# Patient Record
Sex: Female | Born: 1950 | Race: White | Hispanic: No | Smoking: Former smoker
Health system: Southern US, Community
[De-identification: ages and names within clinical notes are randomized; demographics above are authoritative.]

## PROBLEM LIST (undated history)

## (undated) DIAGNOSIS — N281 Cyst of kidney, acquired: Secondary | ICD-10-CM

## (undated) DIAGNOSIS — I2510 Atherosclerotic heart disease of native coronary artery without angina pectoris: Secondary | ICD-10-CM

## (undated) DIAGNOSIS — F32A Depression, unspecified: Secondary | ICD-10-CM

## (undated) DIAGNOSIS — I739 Peripheral vascular disease, unspecified: Secondary | ICD-10-CM

## (undated) DIAGNOSIS — I10 Essential (primary) hypertension: Secondary | ICD-10-CM

## (undated) DIAGNOSIS — M47819 Spondylosis without myelopathy or radiculopathy, site unspecified: Secondary | ICD-10-CM

## (undated) DIAGNOSIS — H3530 Unspecified macular degeneration: Secondary | ICD-10-CM

## (undated) DIAGNOSIS — Z720 Tobacco use: Secondary | ICD-10-CM

## (undated) DIAGNOSIS — I701 Atherosclerosis of renal artery: Secondary | ICD-10-CM

## (undated) DIAGNOSIS — I779 Disorder of arteries and arterioles, unspecified: Secondary | ICD-10-CM

## (undated) DIAGNOSIS — K449 Diaphragmatic hernia without obstruction or gangrene: Secondary | ICD-10-CM

## (undated) DIAGNOSIS — M797 Fibromyalgia: Secondary | ICD-10-CM

## (undated) DIAGNOSIS — E119 Type 2 diabetes mellitus without complications: Secondary | ICD-10-CM

## (undated) DIAGNOSIS — E785 Hyperlipidemia, unspecified: Secondary | ICD-10-CM

## (undated) DIAGNOSIS — K219 Gastro-esophageal reflux disease without esophagitis: Secondary | ICD-10-CM

## (undated) DIAGNOSIS — G4730 Sleep apnea, unspecified: Secondary | ICD-10-CM

## (undated) DIAGNOSIS — R9389 Abnormal findings on diagnostic imaging of other specified body structures: Secondary | ICD-10-CM

## (undated) DIAGNOSIS — F329 Major depressive disorder, single episode, unspecified: Secondary | ICD-10-CM

## (undated) DIAGNOSIS — I214 Non-ST elevation (NSTEMI) myocardial infarction: Secondary | ICD-10-CM

## (undated) DIAGNOSIS — G2581 Restless legs syndrome: Secondary | ICD-10-CM

## (undated) DIAGNOSIS — F419 Anxiety disorder, unspecified: Secondary | ICD-10-CM

## (undated) DIAGNOSIS — M1990 Unspecified osteoarthritis, unspecified site: Secondary | ICD-10-CM

## (undated) HISTORY — DX: Diaphragmatic hernia without obstruction or gangrene: K44.9

## (undated) HISTORY — DX: Restless legs syndrome: G25.81

## (undated) HISTORY — DX: Disorder of arteries and arterioles, unspecified: I77.9

## (undated) HISTORY — PX: EYE SURGERY: SHX253

## (undated) HISTORY — PX: BREAST LUMPECTOMY: SHX2

## (undated) HISTORY — DX: Anxiety disorder, unspecified: F41.9

## (undated) HISTORY — DX: Gastro-esophageal reflux disease without esophagitis: K21.9

## (undated) HISTORY — DX: Unspecified osteoarthritis, unspecified site: M19.90

## (undated) HISTORY — DX: Fibromyalgia: M79.7

## (undated) HISTORY — PX: COLONOSCOPY: SHX174

## (undated) HISTORY — DX: Atherosclerosis of renal artery: I70.1

## (undated) HISTORY — DX: Non-ST elevation (NSTEMI) myocardial infarction: I21.4

## (undated) HISTORY — DX: Peripheral vascular disease, unspecified: I73.9

## (undated) HISTORY — PX: LUMBAR DISC SURGERY: SHX700

## (undated) HISTORY — DX: Hyperlipidemia, unspecified: E78.5

## (undated) HISTORY — DX: Essential (primary) hypertension: I10

## (undated) HISTORY — DX: Type 2 diabetes mellitus without complications: E11.9

## (undated) HISTORY — PX: OTHER SURGICAL HISTORY: SHX169

## (undated) HISTORY — DX: Spondylosis without myelopathy or radiculopathy, site unspecified: M47.819

## (undated) HISTORY — PX: TUBAL LIGATION: SHX77

## (undated) HISTORY — DX: Major depressive disorder, single episode, unspecified: F32.9

## (undated) HISTORY — DX: Atherosclerotic heart disease of native coronary artery without angina pectoris: I25.10

## (undated) HISTORY — DX: Depression, unspecified: F32.A

## (undated) HISTORY — PX: TOTAL KNEE ARTHROPLASTY: SHX125

---
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
## 1975-07-15 HISTORY — PX: ABDOMINAL HYSTERECTOMY: SHX81

## 1989-07-14 HISTORY — PX: CHOLECYSTECTOMY: SHX55

## 1992-07-14 HISTORY — PX: SPINE SURGERY: SHX786

## 1994-07-14 HISTORY — PX: OTHER SURGICAL HISTORY: SHX169

## 2002-08-18 ENCOUNTER — Encounter (HOSPITAL_COMMUNITY): Admission: RE | Admit: 2002-08-18 | Discharge: 2002-09-17 | Payer: Self-pay | Admitting: Rheumatology

## 2002-08-18 ENCOUNTER — Encounter: Payer: Self-pay | Admitting: Rheumatology

## 2002-11-24 ENCOUNTER — Encounter (HOSPITAL_COMMUNITY): Admission: RE | Admit: 2002-11-24 | Discharge: 2002-12-24 | Payer: Self-pay | Admitting: Rheumatology

## 2002-11-24 ENCOUNTER — Encounter: Payer: Self-pay | Admitting: Rheumatology

## 2004-01-17 ENCOUNTER — Inpatient Hospital Stay (HOSPITAL_COMMUNITY): Admission: RE | Admit: 2004-01-17 | Discharge: 2004-01-20 | Payer: Self-pay | Admitting: Neurosurgery

## 2004-07-14 HISTORY — PX: JOINT REPLACEMENT: SHX530

## 2008-09-27 ENCOUNTER — Encounter: Payer: Self-pay | Admitting: Cardiology

## 2008-10-23 ENCOUNTER — Ambulatory Visit: Payer: Self-pay | Admitting: Cardiology

## 2008-10-24 ENCOUNTER — Ambulatory Visit: Payer: Self-pay | Admitting: Cardiovascular Disease

## 2008-10-24 ENCOUNTER — Inpatient Hospital Stay (HOSPITAL_COMMUNITY): Admission: AD | Admit: 2008-10-24 | Discharge: 2008-10-26 | Payer: Self-pay | Admitting: Internal Medicine

## 2008-10-24 ENCOUNTER — Encounter: Payer: Self-pay | Admitting: Cardiology

## 2008-11-14 ENCOUNTER — Ambulatory Visit: Payer: Self-pay | Admitting: Cardiology

## 2008-12-25 ENCOUNTER — Ambulatory Visit: Payer: Self-pay | Admitting: Surgery

## 2009-02-12 ENCOUNTER — Ambulatory Visit: Payer: Self-pay | Admitting: Surgery

## 2009-02-16 ENCOUNTER — Ambulatory Visit: Payer: Self-pay | Admitting: Cardiology

## 2009-02-26 ENCOUNTER — Encounter: Payer: Self-pay | Admitting: Cardiology

## 2009-02-28 ENCOUNTER — Encounter: Payer: Self-pay | Admitting: Cardiology

## 2009-02-28 ENCOUNTER — Ambulatory Visit: Payer: Self-pay | Admitting: Surgery

## 2009-02-28 ENCOUNTER — Inpatient Hospital Stay (HOSPITAL_COMMUNITY): Admission: RE | Admit: 2009-02-28 | Discharge: 2009-03-01 | Payer: Self-pay | Admitting: Surgery

## 2009-02-28 ENCOUNTER — Encounter: Payer: Self-pay | Admitting: Surgery

## 2009-02-28 HISTORY — PX: CAROTID ENDARTERECTOMY: SUR193

## 2009-03-01 ENCOUNTER — Encounter: Payer: Self-pay | Admitting: Cardiology

## 2009-03-06 ENCOUNTER — Encounter: Payer: Self-pay | Admitting: Cardiology

## 2009-03-12 DIAGNOSIS — I2510 Atherosclerotic heart disease of native coronary artery without angina pectoris: Secondary | ICD-10-CM | POA: Insufficient documentation

## 2009-03-12 DIAGNOSIS — I10 Essential (primary) hypertension: Secondary | ICD-10-CM | POA: Insufficient documentation

## 2009-03-26 ENCOUNTER — Encounter: Payer: Self-pay | Admitting: Cardiology

## 2009-03-26 ENCOUNTER — Ambulatory Visit: Payer: Self-pay | Admitting: Surgery

## 2009-04-19 ENCOUNTER — Ambulatory Visit: Payer: Self-pay | Admitting: Cardiology

## 2009-04-19 DIAGNOSIS — E785 Hyperlipidemia, unspecified: Secondary | ICD-10-CM

## 2009-04-30 ENCOUNTER — Encounter (INDEPENDENT_AMBULATORY_CARE_PROVIDER_SITE_OTHER): Payer: Self-pay | Admitting: *Deleted

## 2009-05-11 ENCOUNTER — Encounter: Payer: Self-pay | Admitting: Physician Assistant

## 2009-05-14 ENCOUNTER — Encounter (INDEPENDENT_AMBULATORY_CARE_PROVIDER_SITE_OTHER): Payer: Self-pay | Admitting: *Deleted

## 2009-09-17 ENCOUNTER — Encounter: Payer: Self-pay | Admitting: Cardiology

## 2009-10-01 ENCOUNTER — Encounter: Payer: Self-pay | Admitting: Cardiology

## 2009-10-01 ENCOUNTER — Ambulatory Visit: Payer: Self-pay | Admitting: Surgery

## 2009-10-30 ENCOUNTER — Encounter: Payer: Self-pay | Admitting: Cardiology

## 2009-11-13 ENCOUNTER — Ambulatory Visit: Payer: Self-pay | Admitting: Cardiology

## 2009-11-13 DIAGNOSIS — I6529 Occlusion and stenosis of unspecified carotid artery: Secondary | ICD-10-CM | POA: Insufficient documentation

## 2010-05-20 ENCOUNTER — Ambulatory Visit: Payer: Self-pay | Admitting: Surgery

## 2010-07-23 ENCOUNTER — Ambulatory Visit
Admission: RE | Admit: 2010-07-23 | Discharge: 2010-07-23 | Payer: Self-pay | Source: Home / Self Care | Attending: Cardiology | Admitting: Cardiology

## 2010-07-23 ENCOUNTER — Encounter: Payer: Self-pay | Admitting: Cardiology

## 2010-08-15 NOTE — Assessment & Plan Note (Signed)
Summary: 6 MO FU -SRS   Visit Type:  Follow-up Primary Provider:  Dr. Doreen Beam   History of Present Illness: 60 year old woman presents for a followup visit. She denies any significant problems with angina or unusual shortness of breath. She has lost a significant amount of weight, reportedly through exercise and limited carbohydrate intake. She reports compliance with her medications and states she had recent labs done by Dr. Sherril Croon.  She underwent a left carotid endarterectomy in August of last year. Recent followup carotid duplex results are noted below. She is followed by Dr. Myra Gianotti.  Followup labs from 29 October reveal total cholesterol 151, triglycerides 138, HDL 51, LDL to 72, AST 21, ALT 16.  She states that she was found to have some apparent gastritis based on endoscopy since I last saw her. This has improved, and she continues on dual antiplatelet therapy. She does have 4 drug-eluting stents between the right coronary artery and circumflex, and after discussing the matter, we elected to continue her dual antiplatelet therapy at least for the time being. She is greater than one year out at this point from her intervention.  Current Medications (verified): 1)  Maxzide-25 37.5-25 Mg Tabs (Triamterene-Hctz) .... Take 1/2 Tablet By Mouth Once A Day 2)  Dexilant 60 Mg Cpdr (Dexlansoprazole) .... Take 1 Tablet By Mouth Once A Day 3)  Plavix 75 Mg Tabs (Clopidogrel Bisulfate) .... Take One Tablet By Mouth Daily 4)  Simvastatin 80 Mg Tabs (Simvastatin) .... Take One Tablet By Mouth Daily At Bedtime 5)  Aspirin Ec 325 Mg Tbec (Aspirin) .... Take One Tablet By Mouth Daily 6)  Nabumetone 750 Mg Tabs (Nabumetone) .... Take 1 Tablet By Mouth Two Times A Day 7)  Folic Acid 1 Mg Tabs (Folic Acid) .... Take 1 Tablet By Mouth Once A Day 8)  Soma 350 Mg Tabs (Carisoprodol) .... Take 1 Tablet By Mouth Once A Day 9)  Lexapro 10 Mg Tabs (Escitalopram Oxalate) .... Take 1 Tablet By Mouth Once A  Day 10)  Potassium Chloride Cr 10 Meq Cr-Caps (Potassium Chloride) .... Take One Tablet By Mouth Daily 11)  Metformin Hcl 500 Mg Tabs (Metformin Hcl) .... Take 1 Tablet By Mouth Once A Day 12)  Doxepin Hcl 50 Mg Caps (Doxepin Hcl) .... Take 1 Tab By Mouth At Bedtime 13)  Percocet 7.5-500 Mg Tabs (Oxycodone-Acetaminophen) .... Take 1 Tablet By Mouth Three Times A Day 14)  Furosemide 20 Mg Tabs (Furosemide) .... Take One Tablet By Mouth Daily. As Needed 15)  Valium 10 Mg Tabs (Diazepam) .... As Needed 16)  Nitrostat 0.4 Mg Subl (Nitroglycerin) .... One Tablet Under Tongue As Needed For Chest Pain,repeat Only 1 Time in .use As Directed 17)  Dexilant 60 Mg Cpdr (Dexlansoprazole) .... Take 1 Tab Daily  Allergies (verified): No Known Drug Allergies  Past History:  Social History: Last updated: 11/13/2009 Disabled  Tobacco Use - Former Alcohol Use - no  Past Medical History: Fibromyalgia Carotid artery disease - 60-79% RICA, 40-59% LICA (Dr. Myra Gianotti) Myocardial Infarction - NSTEMI 4/10 CAD - DES RCA/circumflex 4/10, LVEF 65% Hypertension Diabetes Type 2 Arthritis G E R D Depression Anxiety  Past Surgical History: Tubal ligation.  Partial hysterectomy Back Surgery Cholecystectomy Knee Arthroplasty-Total Lumpectomy Neck fusion Carotid Endarterectomy - left, 8/10  Family History: Father: died age 71 with MI Mother: died age 28 with CAD  Social History: Disabled  Tobacco Use - Former Alcohol Use - no  Review of Systems  The patient denies anorexia,  fever, weight gain, chest pain, syncope, dyspnea on exertion, peripheral edema, prolonged cough, hemoptysis, melena, hematochezia, and severe indigestion/heartburn.         Otherwise reviewed and negative.  Vital Signs:  Patient profile:   60 year old female Weight:      127 pounds Pulse rate:   79 / minute BP sitting:   110 / 60  (right arm)  Vitals Entered By: Dreama Saa, CNA (Nov 13, 2009 10:14  AM)  Physical Exam  Additional Exam:  Normally nourished appearing woman in no acute distress. HEENT: Conjunctiva and lids normal, oropharynx clear. Neck: Supple, no elevated JVP, carotid bruits noted. Well-healed CEA scar. Lungs: Clear to auscultation, diminished, unlabored. Cardiac: Regular rate and rhythm, no S3 or rub. Abdomen: Soft, nontender, bowel sounds present. Neck; no pitting edema.   EKG  Procedure date:  11/13/2009  Findings:      Normal sinus rhythm at 75 beats per minute with left atrial enlargement and nonspecific T-wave changes.  Impression & Recommendations:  Problem # 1:  CAD, NATIVE VESSEL (ICD-414.01)  Symptomatically stable on present medical regimen. Electrocardiogram is nonspecific. She has continued with a regular exercise regimen and has lost weight with diet as well. I plan to see her back in 6 months.  Her updated medication list for this problem includes:    Plavix 75 Mg Tabs (Clopidogrel bisulfate) .Marland Kitchen... Take one tablet by mouth daily    Aspirin Ec 325 Mg Tbec (Aspirin) .Marland Kitchen... Take one tablet by mouth daily    Nitrostat 0.4 Mg Subl (Nitroglycerin) ..... One tablet under tongue as needed for chest pain,repeat only 1 time in .use as directed  Problem # 2:  DYSLIPIDEMIA (ICD-272.4)  Recently followed by Dr. Sherril Croon. Will request labs.  Her updated medication list for this problem includes:    Simvastatin 80 Mg Tabs (Simvastatin) .Marland Kitchen... Take one tablet by mouth daily at bedtime  Problem # 3:  HYPERTENSION, UNSPECIFIED (ICD-401.9)  Blood pressure well controlled.  Her updated medication list for this problem includes:    Maxzide-25 37.5-25 Mg Tabs (Triamterene-hctz) .Marland Kitchen... Take 1/2 tablet by mouth once a day    Aspirin Ec 325 Mg Tbec (Aspirin) .Marland Kitchen... Take one tablet by mouth daily    Furosemide 20 Mg Tabs (Furosemide) .Marland Kitchen... Take one tablet by mouth daily. 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 Myra Gianotti.  Her updated medication list for this problem includes:    Plavix 75 Mg Tabs (Clopidogrel bisulfate) .Marland Kitchen... Take one tablet by mouth daily    Aspirin Ec 325 Mg Tbec (Aspirin) .Marland Kitchen... Take one tablet by mouth daily  Patient Instructions: 1)  Your physician wants you to follow-up in: 6 months. You will receive a reminder letter in the mail one-two months in advance. If you don't receive a letter, please call our office to schedule the follow-up appointment. 2)  Your physician recommends that you continue on your current medications as directed. Please refer to the Current Medication list given to you today.

## 2010-08-15 NOTE — Assessment & Plan Note (Signed)
Summary: 6 month fu -no hospital visits/vs   Visit Type:  Follow-up Primary Provider:  Dr. Doreen Beam   History of Present Illness: 60 year old woman presents for followup. She was seen in May of 2011. She denies any problems with progressive angina or shortness of breath with activity.  She reports continued follow up with Dr. Myra Gianotti for her carotid disease, reportedly stable. She states she's been able to lose some weight, and is no longer on diabetic medication. She states that she checks her glucose at home and it typically ranges from 100-120. Also reports interval diagnosis of macular degeneration, cataracts, and states that she underwent polypectomy with colonoscopy in the last 6 months.  She continues to have lipid followup with Dr. Sherril Croon. Has tolerated high-dose simvastatin for quite some time.  Preventive Screening-Counseling & Management  Alcohol-Tobacco     Smoking Status: quit     Year Quit: 10/2008  Current Medications (verified): 1)  Maxzide-25 37.5-25 Mg Tabs (Triamterene-Hctz) .... Take 1/2 Tablet By Mouth Once A Day 2)  Dexilant 60 Mg Cpdr (Dexlansoprazole) .... Take 1 Tablet By Mouth Once A Day 3)  Plavix 75 Mg Tabs (Clopidogrel Bisulfate) .... Take One Tablet By Mouth Daily 4)  Simvastatin 80 Mg Tabs (Simvastatin) .... Take One Tablet By Mouth Daily At Bedtime 5)  Aspirin Ec 325 Mg Tbec (Aspirin) .... Take One Tablet By Mouth Daily 6)  Folic Acid 1 Mg Tabs (Folic Acid) .... Take 1 Tablet By Mouth Once A Day 7)  Soma 350 Mg Tabs (Carisoprodol) .... Take 1 Tablet By Mouth Once A Day 8)  Lexapro 10 Mg Tabs (Escitalopram Oxalate) .... Take 1 Tablet By Mouth Once A Day 9)  Potassium Chloride Cr 10 Meq Cr-Caps (Potassium Chloride) .... Take One Tablet By Mouth Daily 10)  Percocet 10-325 Mg Tabs (Oxycodone-Acetaminophen) .... Take 1 Tablet By Mouth Four Times A Day As Needed 11)  Valium 10 Mg Tabs (Diazepam) .... As Needed 12)  Nitrostat 0.4 Mg Subl (Nitroglycerin) ....  One Tablet Under Tongue As Needed For Chest Pain,repeat Only 1 Time in .use As Directed 13)  Fentanyl 25 Mcg/hr Pt72 (Fentanyl) .... Apply 1 Patch Every 72 Hours 14)  Vyvanse 30 Mg Caps (Lisdexamfetamine Dimesylate) .... Take 1 Tablet By Mouth Once A Day  Allergies (verified): 1)  ! Codeine 2)  ! * Lyrica 3)  ! Celebrex  Comments:  Nurse/Medical Assistant: The patient's medication bottles and allergies were reviewed with the patient and were updated in the Medication and Allergy Lists.  Past History:  Past Medical History: Last updated: 11/13/2009 Fibromyalgia Carotid artery disease - 60-79% RICA, 40-59% LICA (Dr. Myra Gianotti) Myocardial Infarction - NSTEMI 4/10 CAD - DES RCA/circumflex 4/10, LVEF 65% Hypertension Diabetes Type 2 Arthritis G E R D Depression Anxiety  Social History: Last updated: 11/13/2009 Disabled  Tobacco Use - Former Alcohol Use - no  Review of Systems  The patient denies anorexia, fever, weight gain, chest pain, syncope, dyspnea on exertion, peripheral edema, abdominal pain, melena, and hematochezia.         Otherwise reviewed and negative except as outlined.  Vital Signs:  Patient profile:   61 year old female Height:      59 inches Weight:      116 pounds BMI:     23.51 Pulse rate:   80 / minute BP sitting:   148 / 73  (left arm) Cuff size:   regular  Vitals Entered By: Carlye Grippe (July 23, 2010 3:25 PM)  Physical Exam  Additional Exam:  Normally nourished appearing woman in no acute distress. HEENT: Conjunctiva and lids normal, oropharynx clear. Neck: Supple, no elevated JVP, bilateral carotid bruits noted. Well-healed CEA scar. Lungs: Clear to auscultation, diminished, unlabored. Cardiac: Regular rate and rhythm, no S3 or rub. Abdomen: Soft, nontender, bowel sounds present. Extremities: no pitting edema. Skin: Warm and dry. Musculoskeletal: No kyphosis. Neuropsychiatric: Alert and oriented x3,  appropriate.   EKG  Procedure date:  07/23/2010  Findings:      Sinus rhythm 88 beats per minute with left atrial enlargement, increased voltage.  Impression & Recommendations:  Problem # 1:  CAD, NATIVE VESSEL (ICD-414.01)  Symptomatically stable on medical therapy. Refilled nitroglycerin so she has a fresh bottle. Has not required any regular use however. Recommended continued exercise, weight control. Follow up in 6 months.  Her updated medication list for this problem includes:    Plavix 75 Mg Tabs (Clopidogrel bisulfate) .Marland Kitchen... Take one tablet by mouth daily    Aspirin Ec 325 Mg Tbec (Aspirin) .Marland Kitchen... Take one tablet by mouth daily    Nitrostat 0.4 Mg Subl (Nitroglycerin) ..... One tablet under tongue as needed for chest pain,repeat only 1 time in .use as directed  Orders: EKG w/ Interpretation (93000)  Problem # 2:  DYSLIPIDEMIA (ICD-272.4)  Continue followup with Dr. Sherril Croon.  Her updated medication list for this problem includes:    Simvastatin 80 Mg Tabs (Simvastatin) .Marland Kitchen... 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 Myra Gianotti.  Her updated medication list for this problem includes:    Plavix 75 Mg Tabs (Clopidogrel bisulfate) .Marland Kitchen... Take one tablet by mouth daily    Aspirin Ec 325 Mg Tbec (Aspirin) .Marland Kitchen... Take one tablet by mouth daily  Problem # 4:  HYPERTENSION, UNSPECIFIED (ICD-401.9)  Blood pressure elevated today. Discussed sodium restriction, exercise. Continue present medication.  The following medications were removed from the medication list:    Furosemide 20 Mg Tabs (Furosemide) .Marland Kitchen... Take one tablet by mouth daily. as needed Her updated medication list for this problem includes:    Maxzide-25 37.5-25 Mg Tabs (Triamterene-hctz) .Marland Kitchen... Take 1/2 tablet by mouth once a day    Aspirin Ec 325 Mg Tbec (Aspirin) .Marland Kitchen... Take one tablet by mouth daily  Patient Instructions: 1)   Your physician wants you to follow-up in: 6 months. You will receive a reminder letter in the mail one-two months in advance. If you don't receive a letter, please call our office to schedule the follow-up appointment. 2)  Your physician recommends that you continue on your current medications as directed. Please refer to the Current Medication list given to you today. Prescriptions: NITROSTAT 0.4 MG SUBL (NITROGLYCERIN) one tablet under tongue as needed for chest pain,repeat only 1 time in .use as directed  #25 x 3   Entered by:   Cyril Loosen, RN, BSN   Authorized by:   Loreli Slot, MD, [facility]   Signed by:   Cyril Loosen, RN, BSN on 07/23/2010   Method used:   Electronically to        [facility] [address] (retail)       [address]       [place], [place]  [address]       Ph: [phone] or [phone]       Fax: [phone]   RxID:   [id number]

## 2010-10-19 LAB — TYPE AND SCREEN
ABO/RH(D): A NEG
Antibody Screen: NEGATIVE

## 2010-10-19 LAB — URINALYSIS, ROUTINE W REFLEX MICROSCOPIC
Glucose, UA: NEGATIVE mg/dL
Hgb urine dipstick: NEGATIVE
Protein, ur: NEGATIVE mg/dL
Specific Gravity, Urine: 1.009 (ref 1.005–1.030)
pH: 7.5 (ref 5.0–8.0)

## 2010-10-19 LAB — COMPREHENSIVE METABOLIC PANEL
AST: 24 U/L (ref 0–37)
Albumin: 4.6 g/dL (ref 3.5–5.2)
Alkaline Phosphatase: 95 U/L (ref 39–117)
BUN: 12 mg/dL (ref 6–23)
Chloride: 98 mEq/L (ref 96–112)
GFR calc Af Amer: >60 mL/min (ref >60)
Potassium: 3.8 mEq/L (ref 3.5–5.1)
Total Bilirubin: 1 mg/dL (ref 0.3–1.2)
Total Protein: 7.5 g/dL (ref 6.0–8.3)

## 2010-10-19 LAB — BASIC METABOLIC PANEL
CO2: 29 mEq/L (ref 19–32)
Chloride: 103 mEq/L (ref 96–112)
GFR calc Af Amer: >60 mL/min (ref >60)
Potassium: 3.1 mEq/L — ABNORMAL LOW (ref 3.5–5.1)
Sodium: 138 mEq/L (ref 135–145)

## 2010-10-19 LAB — URINE MICROSCOPIC-ADD ON

## 2010-10-19 LAB — CBC
HCT: 43.5 % (ref 36.0–46.0)
Hemoglobin: 10.6 g/dL — ABNORMAL LOW (ref 12.0–15.0)
MCHC: 34.6 g/dL (ref 30.0–36.0)
MCV: 96.3 fL (ref 78.0–100.0)
Platelets: 189 10*3/uL (ref 150–400)
RBC: 3.18 MIL/uL — ABNORMAL LOW (ref 3.87–5.11)
RDW: 12.3 % (ref 11.5–15.5)
WBC: 10.3 10*3/uL (ref 4.0–10.5)

## 2010-10-19 LAB — GLUCOSE, CAPILLARY

## 2010-10-19 LAB — APTT: aPTT: 28 seconds (ref 24–37)

## 2010-10-19 LAB — PROTIME-INR: INR: 1 (ref 0.00–1.49)

## 2010-10-23 LAB — GLUCOSE, CAPILLARY
Glucose-Capillary: 101 mg/dL — ABNORMAL HIGH (ref 70–99)
Glucose-Capillary: 104 mg/dL — ABNORMAL HIGH (ref 70–99)
Glucose-Capillary: 107 mg/dL — ABNORMAL HIGH (ref 70–99)
Glucose-Capillary: 125 mg/dL — ABNORMAL HIGH (ref 70–99)
Glucose-Capillary: 177 mg/dL — ABNORMAL HIGH (ref 70–99)
Glucose-Capillary: 85 mg/dL (ref 70–99)
Glucose-Capillary: 88 mg/dL (ref 70–99)

## 2010-10-23 LAB — BASIC METABOLIC PANEL
BUN: 18 mg/dL (ref 6–23)
CO2: 28 mEq/L (ref 19–32)
CO2: 30 mEq/L (ref 19–32)
Chloride: 106 mEq/L (ref 96–112)
GFR calc Af Amer: 55 mL/min — ABNORMAL LOW (ref >60)
GFR calc non Af Amer: 55 mL/min — ABNORMAL LOW (ref >60)
Glucose, Bld: 90 mg/dL (ref 70–99)
Potassium: 3.7 mEq/L (ref 3.5–5.1)
Sodium: 144 mEq/L (ref 135–145)

## 2010-10-23 LAB — CBC
HCT: 38.4 % (ref 36.0–46.0)
Hemoglobin: 13.3 g/dL (ref 12.0–15.0)
MCHC: 34.4 g/dL (ref 30.0–36.0)
MCHC: 34.5 g/dL (ref 30.0–36.0)
MCV: 92.2 fL (ref 78.0–100.0)
Platelets: 145 10*3/uL — ABNORMAL LOW (ref 150–400)
RBC: 4.2 MIL/uL (ref 3.87–5.11)
RDW: 12.9 % (ref 11.5–15.5)
WBC: 7.6 10*3/uL (ref 4.0–10.5)

## 2010-10-23 LAB — HEMOGLOBIN A1C
Hgb A1c MFr Bld: 5.9 % (ref 4.6–6.1)
Mean Plasma Glucose: 123 mg/dL

## 2010-10-23 LAB — LIPID PANEL
Triglycerides: 148 mg/dL (ref <150)
VLDL: 30 mg/dL (ref 0–40)

## 2010-10-23 LAB — HEPARIN LEVEL (UNFRACTIONATED): Heparin Unfractionated: 0.59 IU/mL (ref 0.30–0.70)

## 2010-10-23 LAB — D-DIMER, QUANTITATIVE: D-Dimer, Quant: <0.22 ug/mL-FEU (ref 0.00–0.48)

## 2010-11-26 NOTE — Assessment & Plan Note (Signed)
OFFICE VISIT   [name], [name]  DOB:  [birth year]                                       03/26/2009  EAVWU#:[id number]   REASON FOR VISIT:  Follow up carotid.   HISTORY:  This is a 60 year old female with multiple comorbidities,  having recently suffered a myocardial infarction with a symptomatic left  carotid stenosis.  She underwent left carotid endarterectomy on  02/28/2009.  Operative findings included plaque that extended under the  clavicle that could not be all removed at the time of the operation.  She tolerated the procedure without incident, was discharged home the  following day.  She comes back in today for follow-up.  She still has  some residual left-sided facial droop; however, otherwise she is  neurologically intact.  Her incision is well-healed.   The patient will come back to see me in 6 months for a repeat carotid  ultrasound.  She had bilateral moderate stenosis.   Jorge Ny, MD  Electronically Signed   VWB/MEDQ  [name]:  03/26/2009  T:  03/27/2009  Job:  [id number]

## 2010-11-26 NOTE — Cardiovascular Report (Signed)
NAMEJASPER, [name]                ACCOUNT NO.:  [id number]   MEDICAL RECORD NO.:  [id number]          PATIENT TYPE:  INP   LOCATION:  2505                         FACILITY:  [facility]   PHYSICIAN:  Verne Carrow, MDDATE OF BIRTH:  [birth year]   DATE OF PROCEDURE:  10/25/2008  DATE OF DISCHARGE:                            CARDIAC CATHETERIZATION   PRIMARY CARE PHYSICIAN:  Doreen Beam, MD   PRIMARY CARDIOLOGIST:  Jonelle Sidle, MD   PROCEDURE PERFORMED:  1. Percutaneous coronary intervention with placement of Xience drug-      eluting stent in the mid circumflex coronary artery.  2. Percutaneous coronary intervention with placement of a Xience drug-      eluting stent in the mid right coronary artery.  3. Percutaneous coronary intervention with placement of 2 Xience drug-      eluting stents in the proximal and ostial right coronary artery.   OPERATOR:  Verne Carrow, MD   PROCEDURE IN DETAIL:  The patient's diagnostic cath was performed this  morning by Dr. Rollene Rotunda.  The patient was found to have severely  obstructive lesions in the mid circumflex artery, the mid right coronary  artery, and the ostium of the right coronary artery.  I was asked to  come in and perform the patient's coronary intervention.  The patient  had been consented for possible percutaneous coronary intervention prior  to her diagnostic catheterization.  Initially, she was given a bolus of  Angiomax and drip was started.  She was given 600 mg of Plavix on the  cath table.  An XB 3.0 guiding catheter was used to selectively engage  the left main coronary artery.  At this point, the ACT was greater than  200, and a cougar intracoronary wire was passed down the circumflex  artery beyond the area of tightest stenosis.  A 2.5 x 12 mm balloon was  initially inflated in the area of tightest stenosis.  A 2.5 x 15 mm  Xience drug-eluting stent was then deployed in the area of tightest  stenosis.   A 2.75 x 12 mm noncompliant balloon was then used for post-  dilatation of lesion.  The stenosis prior to the intervention was 80%,  following intervention was 0%.   I then turned my attention to the right coronary artery.  A JR-4 guiding  catheter with side holes was used to approach the ostium of the right  coronary artery.  When the catheter was placed just outside the ostium,  I advanced a cougar intracoronary wire down the right coronary artery  without difficulty.  I initially used 2.5 x 12 mm balloon and inflated  this in the ostium.  This balloon expanded well.  I then removed this  balloon and passed a 2.5 x 8 mm balloon into the mid right coronary  artery at the area of tight stenosis.  This balloon was inflated and  then removed.  A 2.75 x 8 mm Xience drug-eluting stent was deployed in  the mid right coronary artery.  The stenosis was taken from 75% to 0%.   At this point, I  inserted a 3.0 x 12 mm Xience drug-eluting stent into  the proximal right coronary artery.  It was difficult to see exactly  where the ostium was in the vessel.  The vessel did have some mild  calcification which made stent placement difficult.  The stent was  deployed in the area of the ostium of the right coronary artery,  however, with deployment, the stent did slide forward a bit.  The ostium  was missed initially with the stent.  A 3.0 x 8 mm Xience drug-eluting  stent was then deployed at the ostium in an overlapping fashion with the  other stent.  There were 1-2 mm of stent struts hanging out into the  aorta in an intentional manner.  The stent was deployed with an  excellent angiographic result.  A 3.25 x 20 mm noncompliant balloon was  then used to post-dilate the overlapping stents in the proximal and  ostial right coronary artery.  This stenosis prior to the intervention  was 90%, following the intervention was 0%.   IMPRESSION:  Successful percutaneous coronary intervention with  placement of  drug-eluting stents in the mid circumflex artery, mid right  coronary artery, and the ostium of the right coronary artery.   RECOMMENDATIONS:  The patient should be continued on aspirin and Plavix  for at least 1 year.  She will be monitored closely tonight and  discharged home tomorrow on appropriate medical therapy if she does  well.      Verne Carrow, MD  Electronically Signed     CM/MEDQ  D:  10/25/2008  T:  10/26/2008  Job:  [id number]   cc:   Doreen Beam, MD  Jonelle Sidle, MD

## 2010-11-26 NOTE — Assessment & Plan Note (Signed)
OFFICE VISIT   Heather, MOYANO [name]  DOB:  07-02-1951                                       02/12/2009  EAVWU#:[id number]   REASON FOR VISIT:  Followup.   HISTORY:  This is a 60 year old female whom I saw for evaluation of  carotid disease.  The patient was initially found to have a right  carotid bruit.  Duplex was performed which revealed 60% to 79% stenosis  bilaterally.  On further questioning, the patient reports an episode of  left facial numbness over the past 1-2 years as well as several episodes  of right-sided arm paresthesias which last for several minutes then  resolve.  She has had an episode where her memory has disappeared and  she cannot recall where she is, and has trouble with her speech.  I have  sent her to be further evaluated by Dr. Pearlean Brownie for neurologic evaluation.  He has ordered an MRI which shows no acute abnormalities.  Transcranial  Dopplers were negative.  MRA shows 50% proximal right ICA stenosis and  75% left ICA stenosis.  The patient has not had any episodes since our  last discussion.  We have contemplated having her set up for carotid  study and the patient states that she really would rather not undergone  stenting.  She did have a heart attack in April and coronary  intervention was performed.  She comes back in today for further  discussions.   PAST MEDICAL HISTORY:  Fibromyalgia, history of MI, diabetes,  osteoarthritis, fibromyalgia, gastroesophageal reflux disease,  depression, anxiety, hypertension, coronary artery disease.   SOCIAL HISTORY:  Negative for tobacco.  Negative for alcohol.  She quit  smoking in 2010.  She smoked for 45 years.   MEDICATIONS:  Please see medical record.   ALLERGIES:  CODEINE, MORPHINE, AND REGLAN.   ASSESSMENT AND PLAN:  This is a 60 year old female with a questionable  transient ischemic attack with moderate stenosis in her left internal  carotid artery.  After a lengthy discussion of  the pros and cons of  carotid artery stenting versus carotid artery endarterectomy, we have  elected to try to pursue endarterectomy.  I am going to have her see Dr.  Diona Browner for cardiac clearance.  She is also going to see Dr. Pearlean Brownie  tomorrow to get his opinion as to whether or not we should proceed with  carotid endarterectomy.  I have tentatively put her on the schedule for  Wednesday, August 18th.  The risks and benefits of the procedure were  discussed with the patient including the risk of heart attack, the risk  of stroke, and the risk of nerve injury.  She understands all these.  We  will continue her on her aspirin and Plavix.   Jorge Ny, MD  Electronically Signed   VWB/MEDQ  [name]:  02/12/2009  T:  02/13/2009  Job:  [id number]   cc:   Pramod P. Pearlean Brownie, MD  Verne Carrow, MD  Jonelle Sidle, MD  Doreen Beam, MD

## 2010-11-26 NOTE — Assessment & Plan Note (Signed)
[facility]                          [facility] OFFICE NOTE   [name], [name]                       MRN:          [id number]  DATE:11/14/2008                            DOB:          1951/04/02    PRIMARY CARE PHYSICIAN:  Doreen Beam, MD   REASON FOR VISIT:  Posthospital followup.   HISTORY OF PRESENT ILLNESS:  I saw [name] as an inpatient consult  back in April in the setting of a non-ST-elevation myocardial  infarction.  She was transferred to [facility] for further  evaluation.  Prior to her catheterization, she had a D-dimer checked  which was completely normal at less than 0.22.  She subsequently  underwent a cardiac catheterization which revealed severe two-vessel  obstructive coronary disease.  The left anterior descending had 25%  proximal stenosis, followed by mild luminal irregularities.  The  circumflex had an ostial 30% stenosis with a mid vessel 80% stenosis and  mild to moderate disease within the obtuse marginal system.  The right  coronary artery was large and dominant with an ostial calcified 90%  stenosis and a mid 75% stenosis.  There was mid occlusion of the  posterior descending branch and some left-to-right collaterals in this  region.  Ejection fraction was 65% overall.  The patient was  subsequently managed with percutaneous intervention by Dr. Clifton James and  underwent placement of drug-eluting stents within the mid circumflex,  mid right coronary artery, and ostial right coronary artery.  She  tolerated the procedure well, had her medical therapy adjusted, and was  ultimately discharged home in stable condition.  She comes to the office  today with her daughter and states that she has been doing very well.  She has stopped smoking and has been exercising, walking 30 minutes  twice a day, typically.  She is not having any angina with this  activity.  She reports compliance with her medications.   ALLERGIES:  Codeine, morphine, and Lamictal.   PRESENT MEDICATIONS:  1. Simvastatin 80 mg p.o. nightly.  2. Plavix 75 mg p.o. daily.  3. Aspirin 325 mg p.o. daily.  4. Nabumetone 750 mg p.o. b.i.d.  5. Folic acid 1 mg p.o. daily.  6. Soma 350 mg p.o. nightly.  7. Lexapro 10 mg p.o. nightly.  8. Valium 10 mg p.o. t.i.d.  9. Maxzide 12.5 mg p.o. daily.  10.Prednisone 5 mg p.o. daily.  11.K-Dur 10 mEq p.o. daily.  12.Metformin 500 mg p.o. daily.  13.Doxepin 50 mg p.o. nightly.  14.Percocet 7.5 mg p.o. t.i.d.  15.BuSpar 10 mg p.o. t.i.d.  16.Nicoderm patch 21 mg and Commit Lozenge.  17.She also has Xanax 1 mg p.o. nightly.   REVIEW OF SYSTEMS:  Outlined above.  Otherwise reviewed negative.   PHYSICAL EXAMINATION:  VITAL SIGNS:  Blood pressure 135/72, heart rate  is 77, weight is 173 pounds, oxygen saturation is 96% on room air.  GENERAL:  The patient is in no acute distress.  HEENT:  Conjunctiva is normal.  Oropharynx is clear.  NECK:  Supple.  Right carotid bruit is noted.  No  thyromegaly.  LUNGS:  Clear without labored breathing at rest.  Diminished breath  sounds noted.  No wheezing.  CARDIAC:  Regular rate and rhythm.  No S3 gallop.  ABDOMEN:  Soft, nontender.  No active bowel sounds.  EXTREMITIES:  Exhibit no frank pitting edema.  Distal pulses are 2+.  SKIN:  Warm and dry.  MUSCULOSKELETAL:  No kyphosis noted.  NEUROPSYCHIATRIC:  The patient is alert and oriented x3.  Affect is  appropriate.   IMPRESSION AND RECOMMENDATIONS:  1. Two-vessel obstructive coronary artery disease including the      circumflex and right coronary artery with overall preserved left      ventricular systolic function and recent non-ST-elevation      myocardial infarction.  The patient is status post placement of      drug-eluting stents within the circumflex and right coronary      arteries and will continue on dual antiplatelet therapy,      uninterrupted, for the next year.  She is doing quite  well and has      stopped smoking since this event.  I encouraged her to continue      regular exercise and we will plan to see her back over the next few      months.  2. Hyperlipidemia, placed on high-dose simvastatin therapy.  She will      have a followup lipid profile, liver function tests over the next 8      weeks.  3. Right carotid bruit, we will obtain carotid duplex scan to exclude      obstructive carotid artery disease.     Jonelle Sidle, MD  Electronically Signed    SGM/MedQ  DD: 11/14/2008  DT: 11/15/2008  Job #: [id number]   cc:   Doreen Beam, MD

## 2010-11-26 NOTE — Assessment & Plan Note (Signed)
OFFICE VISIT   [name], [name]  DOB:  February 27, 1951                                       12/25/2008  ZOXWR#:[id number]   REASON FOR VISIT:  Cerebrovascular disease.   REFERRING PHYSICIAN:  Dr. Diona Browner   HISTORY:  This is a very complicated, 60 year old female who I am seeing  at request of Dr. Simona Huh for evaluation of carotid disease.  The  patient recently was seen and found to have a right carotid bruit.  A  carotid Doppler was performed which revealed 60% to 79% bilateral  stenosis.  The plan was specifically discussed as to whether not the  patient has symptoms.  She does report several episodes of left facial  numbness over the past 1-2 years as well as several episodes of right-  sided arm paresthesias.  These episodes lasts for several minutes and  then resolve.  She also describes bilateral lower extremity numbness.  She says she has had episodes where her memory has disappeared and she  cannot recall where she is and she has trouble with her speech.   In April, the patient presented to [facility] with a non-ST elevated MI and  underwent coronary revascularization with drug-eluting stents.  She has  done well from that and is currently on aspirin and Plavix.  She has not  had any chest pain since that time.  Her other risk factors include  diabetes, hypertension, hypercholesterolemia, and history of smoking.   REVIEW OF SYSTEMS:  GENERAL:  Positive for weight loss.  She weighs 164  pounds.  CARDIAC:  She has no current chest pain or shortness of breath.  PULMONARY:  Negative for asthma, wheezing, or productive cough.  GI:  Positive for reflux and hiatal hernia.  GU:  Negative.  VASCULAR:  Positive for pain in legs when walking and lying flat.  NEURO:  Negative.  ORTHO:  Positive for arthritis, joint pain, and muscle pain.  PSYCH:  Positive for depression and nervousness.  ENT is negative.  HEM:  Negative.   PAST MEDICAL HISTORY:  1.  Fibromyalgia.  2. History of MI.  3. Diabetes.  4. Osteoarthritis.  5. Fibromyalgia.  6. Gastroesophageal reflux disease.  7. Depression/anxiety.  8. Hypertension.  9. Coronary artery disease.   PAST SURGICAL HISTORY:  1. Tubal ligation.  2. Partial hysterectomy.  3. Lumpectomy  4. Laparoscopic cholecystectomy.  5. L5-S1 discectomy.  6. Bilateral knee arthroplasty.  7. Left knee replacement.  8. Neck fusion, C4 to C6.  9. Coronary stents.   SOCIAL HISTORY:  She does not smoke and does not drink.  She has a  history of smoking but quit in 2010.  She smoked for 45 years.  She is a  Armed forces logistics/support/administrative officer.  She is disabled.   MEDICATIONS:  Metformin, folic acid, Nicoderm, Lasix, prednisone,  Plavix, Maxzide, Relafen, K-Dur 20 mEq, BuSpar, Percocet, Valium,  simvastatin, aspirin, Lexapro, doxepin, Soma, Augmentin, __________,  Phenergan, triamcinolone cream.   ALLERGIES:  CODEINE, MORPHINE, REGLAN.   PHYSICAL EXAMINATION:  Blood pressure 143/83 in the left arm, 154/76 in  the right arm.  Pulse is 71.  GENERAL:  She is well appearing, in no  distress.  HEENT:  Normocephalic, atraumatic.  Pupils equal.  Sclerae  anicteric.  NECK:  Supple.  No JVD.  Right carotid bruit.  There is an  incision along the anterior border of her left sternocleidomastoid from  her anterior approach to her back surgery.  CARDIOVASCULAR:  Regular  rate and rhythm, no murmur.  PULMONARY:  Lungs are clear bilaterally.  ABDOMEN:  Soft, nontender.  Her legs are warm and well perfused.  NEURO:  Cranial II-XII are grossly intact.  She has a nonfocal exam.  SKIN:  Without rash.  PSYCH:  She is alert and oriented x3.   DIAGNOSTICS:  The patient's duplex was repeated.  She has 60% to 79%  stenosis bilaterally with antegrade vertebral artery flow.   ASSESSMENT AND PLAN:  ?  Symptomatic carotid stenosis.  By duplex, the  patient has a moderate to high-grade stenosis bilaterally.  The symptoms   she describes of left facial numbness and right arm numbness could be  secondary to the disease within her left carotid artery.  Based on the  patient's description of these, I am not convinced that these are indeed  TIAs.  I am requesting that she see Dr. Pearlean Brownie with Neurology for further  evaluation and also for clearance for carotid stent should he agree that  these are TIAs.  The patient is a high risk operative candidate due to  her recent MI.  She also has an incision in the left neck from her back  surgery which would be in the same location of her carotid surgery which  again will make her a high-risk candidate.  I am sending her to Dr.  Pearlean Brownie initially so that we can avoid a diagnostic arteriogram but rather  perform a diagnostic arteriogram and if feasible place a carotid stent  at that time.  She will see Dr. Pearlean Brownie this week and we will move forward  from there.   Jorge Ny, MD  Electronically Signed   VWB/MEDQ  [name]:  12/24/2008  T:  12/26/2008  Job:  [id number]   cc:   Verne Carrow, MD  Pramod P. Pearlean Brownie, MD  Jonelle Sidle, MD  Doreen Beam, MD

## 2010-11-26 NOTE — Discharge Summary (Signed)
[name], [name]                ACCOUNT NO.:  [id number]   MEDICAL RECORD NO.:  [id number]          PATIENT TYPE:  INP   LOCATION:  2505                         FACILITY:  [facility]   PHYSICIAN:  Bevelyn Buckles. Bensimhon, MDDATE OF BIRTH:  [birth year]   DATE OF ADMISSION:  10/24/2008  DATE OF DISCHARGE:  10/26/2008                               DISCHARGE SUMMARY   ADDENDUM   Please note, the patient will begin prescription for a nicotine patch 21  mg supply 7 and 14 mg supply 14 and encouraged her to follow up with her  primary care doctor regarding smoking cessation followup.  Please also  note, the patient given script for Glucometer, testing strips, and  alcohol swabs.  The patient also encouraged to follow up with PCP  regarding this issue.  Please also note, the patient was started on  cardiac rehab inpatient and will follow up with phase II cardiac rehab  outpatient.      Jarrett Ables, [facility]      Daniel R. Bensimhon, MD  Electronically Signed    MS/MEDQ  D:  10/26/2008  T:  10/27/2008  Job:  [id number]

## 2010-11-26 NOTE — Assessment & Plan Note (Signed)
[facility]                          [facility] OFFICE NOTE   [name], [name]                       MRN:          [id number]  DATE:02/16/2009                            DOB:          [birth year]    PRIMARY CARE PHYSICIAN:  Doreen Beam, MD   VASCULAR SURGEON:  1. Durene Cal IV, MD   NEUROLOGIST:  Pramod P. Pearlean Brownie, MD   REASON FOR VISIT:  Preoperative evaluation.   HISTORY OF PRESENT ILLNESS:  I saw Ms. [name] in the [employer] back in  May 2010.  She has a history of non-ST-elevation myocardial infarction  back in April, managed with revascularization including drug-eluting  stents to the circumflex and right coronary artery with documentation of  overall normal left ventricular systolic function.  She has done quite  well following this intervention, on medical therapy, without any  recurrent angina.  She states that she is now walking 45 minutes at a  time, twice a day without symptoms.  She reports being compliant with  her medications.  She has also maintained complete smoking cessation.  More recently, she has been under evaluation by Dr. Myra Gianotti related to  bilateral carotid artery disease, left more significant than right based  on followup MRA.  She has also been assessed by Dr. Pearlean Brownie for a formal  neurological evaluation.  At this point, she is tentatively scheduled  for a left carotid endarterectomy on August 18 and is referred for  preoperative assessment.  Her followup electrocardiogram today shows  normal sinus rhythm at 65 beats per minute.   ALLERGIES:  CODEINE, MORPHINE and LAMICTAL.   PRESENT MEDICATIONS:  1. Maxzide 25 mg one-half tablet p.o. daily.  2. Kapidex 60 mg p.o. daily.  3. Topamax 25 mg p.o. q.a.m.  4. Simvastatin 80 mg p.o. nightly.  5. Plavix 75 mg p.o. daily.  6. Aspirin 325 mg p.o. daily.  7. Nabumetone 750 mg p.o. b.i.d.  8. Folic acid 1 mg p.o. daily.  9. Soma 1 p.o. nightly.  10.Lexapro 10 mg  p.o. nightly.  11.K-Dur 10 mEq p.o. daily.  12.Metformin 500 mg p.o. daily.  13.Doxepin 50 mg p.o. nightly.  14.Percocet 7.5 mg p.o. t.i.d.  15.BuSpar 10 mg p.o. t.i.d.  16.NicoDerm 21 mg patch as needed.  17.Commit Lozenges as needed.  18.Lasix 20 mg p.o. daily p.r.n.  19.Valium 10 mg p.o. p.r.n.  20.Xanax 0.5 mg p.o. p.r.n.   REVIEW OF SYSTEMS:  As outlined above.  No unusual bleeding problems.  No palpitations or syncope.  She has NHYA class II dyspnea on exertion.  Otherwise reviewed negative.   PHYSICAL EXAMINATION:  VITAL SIGNS:  Blood pressure 131/79, heart rate  is 66, weight is 156 pounds.  GENERAL:  The patient is comfortable in no acute distress.  HEENT:  Conjunctivae and lids normal.  Oropharynx clear.  NECK:  Supple.  No elevated jugular venous pressure, soft carotid bruit  as noted previously.  LUNGS:  Clear with diminished breath sounds.  CARDIAC:  Regular rate and rhythm, somewhat diminished.  No S3 gallop or  loud systolic murmur.  ABDOMEN:  Soft, nontender.  Normoactive bowel sounds.  EXTREMITIES:  Exhibit no frank pitting edema.  Distal pulses are 2+.  SKIN:  Warm and dry.  MUSCULOSKELETAL:  No kyphosis noted.  NEUROPSYCHIATRIC:  The patient is alert and oriented x3.  Affect seems  appropriate   IMPRESSION/RECOMMENDATIONS:  Preoperative evaluation prior to elective  left carotid endarterectomy under general anesthesia.  Ms. Reames is now  nearly 4 months out from a non-ST-elevation myocardial infarction that  was managed with revascularization including drug-eluting stents to the  circumflex and right coronary artery.  She has done very well  symptomatically and has been compliant with dual-antiplatelet therapy,  smoking cessation, and a regular exercise regimen.  Her  electrocardiogram today is normal.  In light of these reassuring  findings, I think that she should be able to proceed with planned  surgery with an acceptable perioperative cardiovascular  risk.  She  should obviously continue on dual-antiplatelet therapy throughout given  her drug-eluting stents placed in April.  Our service can certainly  consult as necessary around the time of her surgery.  Otherwise, I will  plan to see her back over the next few months.     Jonelle Sidle, MD  Electronically Signed    SGM/MedQ  DD: 02/16/2009  DT: 02/16/2009  Job #: [id number]   cc:   Doreen Beam, MD  V. Durene Cal IV, MD  Pramod P. Pearlean Brownie, MD

## 2010-11-26 NOTE — Discharge Summary (Signed)
[name], ESCORCIA                ACCOUNT NO.:  [id number]   MEDICAL RECORD NO.:  [id number]          PATIENT TYPE:  INP   LOCATION:  2505                         FACILITY:  [facility]   PHYSICIAN:  Verne Carrow, MDDATE OF BIRTH:  1951/07/07   DATE OF ADMISSION:  10/24/2008  DATE OF DISCHARGE:  10/26/2008                               DISCHARGE SUMMARY   PRIMARY CARDIOLOGIST:  Jonelle Sidle, MD   PRIMARY CARE DOCTOR:  Doreen Beam, MD   DISCHARGE DIAGNOSES:  1. Non-ST-elevation myocardial infarction status post 4 times drug-      eluting stent placement (3 to mid right coronary artery and 1 to      circumflex).  2. Diabetes mellitus type 2.   SECONDARY DIAGNOSES:  1. Hypertension.  2. Fibromyalgia.  3. Osteoarthritis.  4. Gastroesophageal reflux disease.  5. Urinary incontinence with history of urinary tract infections and      status post pyelonephritis.  6. Cervical disk disease involving C4-C5, C5-C6, and C6-S7.  7. Carpal tunnel syndrome.  8. Status post total knee arthroplasty.  9. Status post cholecystectomy.  10.Status post hysterectomy.   ALLERGIES:  1. CODEINE.  2. CELEBREX.  3. DICLOFENAC.  4. MORPHINE.   PROCEDURES PERFORMED DURING THIS HOSPITALIZATION:  1. Cardiac catheterization, October 25, 2008, revealing severe two-      vessel CAD with well-preserved EF,  equals 65% with normal wall      motion.  Proximal 25% stenosis by mid luminal irregularities in the      LAD.  Circumflex had ostial 30% stenosis and mid 80% stenosis.      Posterolateral had moderate-sized ostial 40% stenosis.  Ramus      intermediate was large with luminal irregularities.  RCA large and      dominant.  Ostial calcified 90% stenosis in RCA which did not      dilate with intracoronary nitroglycerin.  Mid 75% stenosis of PDA,      large vessel, with mid occlusion.  Distal PDA filled via left-to-      right collaterals.  Posterolateral small, normal EF 65% with normal      wall  motion.  2. Cardiac catheterization, October 25, 2008, - successful PCI with      placement of drug-eluting stents in the mid circumflex artery, mid      RCA, and ostium of the RCA.  3. EKG was performed on October 25, 2008, showing sinus rhythm with a      rate of 67 bpm.  No acute ST-T-wave changes, no significant Q-      waves, normal axis, no evidence of hypertrophy, PR 208, QRS 56, QTc      435.  The patient had EKG on October 26, 2008.  There are no      significant differences from prior tracing except for PR 168.   HISTORY OF PRESENT ILLNESS:  [name] is a 60 year old woman with a  history of fibromyalgia, osteoarthritis, GERD, and HTN.  She has no  clear history of DM, although I know that she has had note that she has  had elevated random glucose levels over the last few years in the  setting of intermittent prednisone use.  She reports a family history of  cardiovascular disease and I confirmed this with her daughter, [name].  She has first-degree relatives with heart disease diagnosed in their  44s.  Taking history from both [name] and her daughter (nurse), the  patient had intermittent episodes of shortness of breath with lower  extremity edema and also some occasional confusion over the last  several weeks.  On day of presentation to [facility] on  October 23, 2008, [name] had finished eating dinner and suddenly  became very weak with shaking feeling and subsequently experienced  discomfort in her chest that was intermittent and moderate in intensity.  The patient's daughter states she looked pale and somewhat cyanotic  around her lips.  [name] reports feeling as though she was going to  die.  She was ultimately transported to [facility] ED and was noted to be  tachycardiac with EKG showing sinus tach of 104 bpm with no acute ST-T-  wave changes, no old tracing for comparison.  Cardiac markers were  normal at first, but then troponin elevated to 0.32,  up to 0.55, and  then down to 0.52, on morning of October 24, 2008, feeling somewhat  better.  Hemoglobin A1c 6.1 and presenting glucose of 51.  Chest x-ray  from October 23, 2008, mildly progressive chronic bronchitic changes with  stable borderline cardiomegaly and instantly notices prominent amount of  stool in colon.  The patient was noted to be tachycardic at the time of  presentation with an oxygen saturation of 925%.  D-dimer level was not  sent.  Cardiology asked to consult .  [name] underwent previous  stress test under direction of Dr. Shelva Majestic 10 years ago.  She did not  tolerate the procedure well per the patient.  No results available.  The  patient was transported to [facility] on October 24, 2008.   HOSPITAL COURSE:  The patient admitted and underwent procedures as  described above.  She tolerated them well without significant  complications.  Note, the patient was enrolled in [employer] and [place]  new word registry clinical trials.  She was given risk and benefits  participation, signed informed consent form, and given a copy prior to  the initiation of any sort of specific procedures.  The patient  underwent tobacco cessation counseling as well as meeting with inpatient  diabetes program staff.  Recommendations were given to start the patient  on metformin as soon as the patient is stable from a renal standpoint  post by IVP dye from cath.  The patient also given education and  information regarding blood glucose checks.  The patient also received  nutrition counseling regarding the importance of following ADA diet.  The patient deemed stable for discharge on October 26, 2008.  Note, vital  signs remained stable during her hospital course at [facility].  Most  recent vital signs on day of discharge with the exception of minimal  intermittent hypertension up to SBP of 166, temperature 97.8 degrees  Fahrenheit, BP 151/58, pulse 66, respiration rate 18, O2 saturation  96%  on room air.  The patient will receive her post cath instructions,  followup instructions, and new medication list with prescriptions at  time of discharge.  She should have no questions or concerns that are  not addressed at that time.   DISCHARGE LABORATORY DATA:  WBC 7.6, HGB 13.3, HCT 38.7, PLT count 105.  Sodium 144, potassium 4.1, chloride 106, CO2 28, BUN 16, creatinine 1.21  (up from 1.02), calcium 9.3.  Hemoglobin A1c, MCH 5.9, cholesterol 213,  triglycerides 148, HDL 49, LDL 134, VLDL 30.  D-dimer less than 0.22.   FOLLOWUP PLANS AND APPOINTMENTS:  1. Dr. Diona Browner in [place], Nov 14, 2008, at 2:45 p.m.  2. The patient instructed to follow up with Dr. Sherril Croon within 2 weeks.   DISCHARGE MEDICATIONS:  1. Aspirin 325 mg p.o. daily.  2. Nabumetone 750 mg p.o. b.i.d.  3. Folic acid 1 mg p.o. daily.  4. Soma 350 mg p.o. nightly.  5. Lexapro 10 mg p.o. nightly.  6. Valium 10 mg p.o. t.i.d. p.r.n.  7. Maxzide 12.5 mg p.o. daily.  8. Prednisone 710 mg p.o. daily p.r.n.  9. K-Dur 10 mEq p.o. daily.  10.Simvastatin 80 mg p.o. daily.  11.Plavix 75 mg p.o. daily.  12.Metformin 500 mg p.o. daily starting on October 28, 2008.   Duration of discharge encounter including physician time was 1 hour.      Jarrett Ables, [facility]      Verne Carrow, MD  Electronically Signed    MS/MEDQ  D:  10/26/2008  T:  10/27/2008  Job:  [id number]   cc:   Doreen Beam, MD  Jonelle Sidle, MD

## 2010-11-26 NOTE — Procedures (Signed)
CAROTID DUPLEX EXAM   INDICATION:  Follow up bilateral carotid artery disease.   HISTORY:  Diabetes:  No.  Cardiac:  Yes.  Hypertension:  Yes.  Smoking:  Quit.  Previous Surgery:  CV History:  Amaurosis Fugax No, Paresthesias No, Hemiparesis No.                                       RIGHT             LEFT  Brachial systolic pressure:         143               154  Brachial Doppler waveforms:  Vertebral direction of flow:        Antegrade         Antegrade  DUPLEX VELOCITIES (cm/sec)  CCA peak systolic                   110               104  ECA peak systolic                   268               188  ICA peak systolic                   370               407  ICA end diastolic                   97                80  PLAQUE MORPHOLOGY:                  Heterogenous      Heterogenous  PLAQUE AMOUNT:                      Moderate          Moderate  PLAQUE LOCATION:                    ICA, ECA          ICA, ECA   IMPRESSION:  1. 60-79% stenosis noted in bilateral internal carotid arteries.  2. Antegrade bilateral vertebral arteries.     ___________________________________________  V. Charlena Cross, MD   MG/MEDQ  D:  12/25/2008  T:  12/25/2008  Job:  [id number]

## 2010-11-26 NOTE — Cardiovascular Report (Signed)
NAMERONIESHA, HOLLINGSHEAD                ACCOUNT NO.:  [id number]   MEDICAL RECORD NO.:  [id number]          PATIENT TYPE:  INP   LOCATION:  2505                         FACILITY:  [facility]   PHYSICIAN:  Rollene Rotunda, MD, FACCDATE OF BIRTH:  20-Aug-1950   DATE OF PROCEDURE:  10/25/2008  DATE OF DISCHARGE:                            CARDIAC CATHETERIZATION   PRIMARY CARE Theoplis Garciagarcia:  Doreen Beam, MD   CARDIOLOGIST:  Jonelle Sidle, MD   PROCEDURE:  Left heart catheterization/coronary arteriography.   INDICATION:  Evaluate patient with unstable angina and a non-Q-wave  myocardial infarction.   PROCEDURE NOTE:  Left heart catheterization was performed via the right  femoral artery.  The artery was cannulated using the anterior wall  puncture.  A #5-French arterial sheath was inserted via the modified  Seldinger technique.  Preformed Judkins, no torque and a pigtail  catheter were utilized.  Of note, I did inject 200 mcg of intracoronary  nitroglycerin into the right coronary artery at one point in the  procedure.   RESULTS:  Hemodynamics:  LV 168/13, AO 169/64.  Coronaries:  The left  main was normal.  The LAD was large and wrapped the apex.  There was  proximal 25% stenosis followed by mid luminal irregularities.  Circumflex had ostial 30% stenosis.  There was mid 80% stenosis.  An OM-  1 was a tiny normal vessel.  There was a posterolateral which was  moderate-sized with ostial 40% stenosis.  The ramus intermediate was  large with luminal irregularities.  Right coronary artery was large and  dominant.  There was an ostial calcified 90% stenosis which did not  dilate with intracoronary nitroglycerin.  There was mid 75% stenosis.  The PDA was a large vessel.  There was a mid occlusion.  The distal PDA  filled via left-to-right collaterals.  There was a posterolateral which  was small and normal.  Left ventriculogram:  The left ventriculogram was  obtained in the RAO projection.  The EF  was 65% with normal wall motion.   CONCLUSION:  Severe two-vessel coronary artery disease.  Well-preserved  ejection fraction.   PLAN:  The patient will have percutaneous revascularization by Dr.  Clifton James.  The plan is initially to treat the circumflex as well as the  ostial and mid RCA.      Rollene Rotunda, MD, [facility]  Electronically Signed     JH/MEDQ  D:  10/25/2008  T:  10/26/2008  Job:  [id number]

## 2010-11-26 NOTE — Op Note (Signed)
NAMECHLOEE, TENA                ACCOUNT NO.:  [id number]   MEDICAL RECORD NO.:  [id number]          PATIENT TYPE:  INP   LOCATION:  3307                         FACILITY:  [facility]   PHYSICIAN:  Juleen China IV, MDDATE OF BIRTH:  August 23, 1950   DATE OF PROCEDURE:  02/28/2009  DATE OF DISCHARGE:                               OPERATIVE REPORT   PREOPERATIVE DIAGNOSIS:  Symptomatic left carotid stenosis.   POSTOPERATIVE DIAGNOSIS:  Symptomatic left carotid stenosis.   PROCEDURE PERFORMED:  Left carotid endarterectomy with patch  angioplasty.   SURGEON:  1. Charlena Cross, MD   ASSISTANTS:  1. Quita Skye. Hart Rochester, MD  2. Regina.   ANESTHESIA:  General.   ESTIMATED BLOOD LOSS:  150 mL.   FINDINGS:  Approximately 75% stenosis with ulcerated plaque.  No  thrombus present.  The plaque extended into the proximal common carotid.  I was unable to get to the end of the proximal portion of the disease.   SPECIMENS:  Plaque.   DRAINS:  None.   CULTURES:  None.   INDICATIONS:  Ms. Jamison Neighbor is a 60 year old female with multiple  comorbidities having recently suffered from myocardial infarction  several months ago.  She has been cleared by Cardiology to proceed with  operation.  She describes having several spells that are consistent with  TIA.  She been seen by Dr. Pearlean Brownie who agrees with this.  She has not had  a stroke.  She comes in today for operation.   PROCEDURE IN DETAIL:  The patient was identified in the holding area,  taken to room 6, and she was placed supine on the table.  General  anesthesia was administered.  The patient was prepped and draped in  standard sterile fashion.  Time-out was called.  Antibiotics were given.  The patient's neck was very immobile as she has had previous neck  surgery.  Her previous surgery was done through the incision on the  anterior border of the sternocleidomastoid.  Her incision from her neck  operation was used.  This was extended up  towards the ear level on the  anterior border of the sternocleidomastoid.  Cautery was used to divide  the subcutaneous tissue.  The platysma muscle was divided with cautery.  The internal jugular vein was identified and skeletonized along its  anterior medial border.  I did not see an obvious common facial vein,  perhaps it had been ligated with her previous neck surgery.  There was a  fair amount of scar tissue in this area from her previous operation.  I  was ultimately able to identify the carotid sheath.  This was opened  sharply and the common carotid artery was surrounded by inflammation.  I  did identify the vagus nerve which was more anterior than normal.  It  was preserved and protected throughout the dissection.  I was able to  get an umbilical tape around the common carotid artery and mobilized the  common carotid up to the superior thyroid artery which was a separate  branch.  The external carotid artery was also fully mobilized  and  encircled with a blue vessel loop.  Dissection then carried out along  the internal carotid artery.  A branch from the external carotid artery  encircled the hypoglossal nerve in order to protect and mobilize the  hypoglossal nerve.  I divided this branch.  Multiple venous tributaries  were also divided between 3-0 silk ties and metal clips.  Ultimately,  the hypoglossal nerve was mobilized.  It was protected and this enabled  me to see the distal internal carotid artery.  The distal internal was  encircled with an umbilical tape.  At this point in time, the patient  was given systemic heparinization.  After the heparin had circulated,  the internal carotid artery was occluded followed by the common and  external carotid artery.  An #11 blade was used to make an arteriotomy  which was extended along the anterolateral border of the common and  internal carotid artery.  A 10-French shunt was placed.  Endarterectomy  was performed with a  Physicist, medical.  Eversion endarterectomy  was performed in the external carotid artery.  The patient had  approximately 70% stenosis.  There was an ulcerative plaque in her  bifurcation.  There was prominent posterior plaque in the common carotid  artery.  It went to the limit of my arteriotomy.  In order to get better  exposure of this, I extended the incision down towards the sternal  notch.  The tissue was divided with cautery.  The common carotid artery  was then mobilized sharply.  Due to the limits of the incision by  palpation, I could not get proximal to the stenosis as this appeared to  go down under the clavicle.  I did open the common carotid artery up  further.  The endarterectomy was made further proximal.  At this point,  I did not feel like I could remove all the plaque and elected to  transect it.  A 6-0 tacking stitch was used to tack down the plaque.  Next, the endarterectomized bed was copiously irrigated.  All potential  embolic debris was removed and ensured that there was no flap in the  internal carotid artery.  A bovine pericardial patch was selected and  patch angioplasty was performed with running 6-0 Prolene.  Prior to  completion of the repair, the shunt was removed.  The internal, common,  and external carotid arteries were all appropriately flushed.  The  arteries were then irrigated with heparin and saline and the angioplasty  was completed.  The clamp was first released on the external followed by  the common carotid artery and after 15-20 seconds the internal carotid  artery was opened.  Two repair stitches were required for hemostasis.  Handheld Doppler was used to evaluate signals in the common, external,  and internal carotid arteries.  All had appropriate signals.  There was  no evidence of audible stenosis.  At this point in time, the patient was  given 50 mg of protamine in order to also assist with hemostasis.  FloSeal was used.  Once I was  satisfied with hemostasis, the carotid  sheath was reapproximated with 3-0 Vicryl, the platysma muscle was  closed with running 3-0 Vicryl, and the skin was closed with 4-0 Vicryl.  Dermabond was placed on the incision.  The patient was then successfully  awakened from anesthesia.  She was found to be moving all 4 extremities  to command and her tongue was midline.  She was taken to recovery room  in stable condition.      Jorge Ny, MD  Electronically Signed     VWB/MEDQ  D:  02/28/2009  T:  03/01/2009  Job:  [id number]

## 2010-11-26 NOTE — Procedures (Signed)
CAROTID DUPLEX EXAM   INDICATION:  Follow up carotid artery disease.   HISTORY:  Diabetes:  Yes, on meds.  Cardiac:  Yes.  Hypertension:  Yes.  Smoking:  Previous.  Previous Surgery:  Left CEA on 02/28/2009 by Dr. Myra Gianotti.  CV History:  Patient states asymptomatic.  Amaurosis Fugax No, Paresthesias No, Hemiparesis No.                                       RIGHT             LEFT  Brachial systolic pressure:         134               136  Brachial Doppler waveforms:         WNL               WNL  Vertebral direction of flow:        Antegrade         Antegrade  DUPLEX VELOCITIES (cm/sec)  CCA peak systolic                   M = 97, D = 271   153  ECA peak systolic                   331               155  ICA peak systolic                   308               151  ICA end diastolic                   72                43  PLAQUE MORPHOLOGY:                  Heterogenous  PLAQUE AMOUNT:                      Moderate/severe   None visualized  PLAQUE LOCATION:                    ICA/ECA/CCA   IMPRESSION:  1. Right internal carotid artery shows evidence of 60-79% stenosis.  2. Right distal common carotid artery stenosis.  3. Right external carotid artery stenosis.  4. Left mid internal carotid artery velocities are suggestive of 40-      59% stenosis; however, no plaque visualized.   ___________________________________________  V. Charlena Cross, MD   AS/MEDQ  D:  10/01/2009  T:  10/02/2009  Job:  [id number]

## 2010-11-26 NOTE — Procedures (Signed)
CAROTID DUPLEX EXAM   INDICATION:  Followup of carotid artery disease.   HISTORY:  Diabetes:  Yes, on medication.  Cardiac:  Yes.  Hypertension:  Yes.  Smoking:  Previously.  Previous Surgery:  Left carotid endarterectomy on 02/28/2009 by Dr.  Myra Gianotti.  CV History:  Currently asymptomatic.  Amaurosis Fugax , Paresthesias , Hemiparesis                                       RIGHT             LEFT  Brachial systolic pressure:         125               119  Brachial Doppler waveforms:         Within normal limits                Within normal limits  Vertebral direction of flow:        Antegrade         Antegrade  DUPLEX VELOCITIES (cm/sec)  CCA peak systolic                   M = 110, D = 313  165  ECA peak systolic                   371               172  ICA peak systolic                   321               146  ICA end diastolic                   75                48  PLAQUE MORPHOLOGY:                  Heterogenous      Intimal wall  thickening  PLAQUE AMOUNT:                      Moderate/severe   Minimal  PLAQUE LOCATION:                    ICA/CCA/ECA       CCA/ICA   IMPRESSION:  1. 60% to 79% stenosis of the right internal carotid artery.  2. Right distal common carotid artery stenosis.  3. 40% to 59% stenosis of the left internal carotid artery.   ___________________________________________  V. Charlena Cross, MD   EM/MEDQ  D:  05/20/2010  T:  05/20/2010  Job:  [id number]

## 2010-11-26 NOTE — Discharge Summary (Signed)
NAMEJAZMENE, RACZ                ACCOUNT NO.:  [id number]   MEDICAL RECORD NO.:  [id number]          PATIENT TYPE:  INP   LOCATION:  3307                         FACILITY:  [facility]   PHYSICIAN:  Juleen China IV, MDDATE OF BIRTH:  03/03/1951   DATE OF ADMISSION:  02/28/2009  DATE OF DISCHARGE:  03/01/2009                               DISCHARGE SUMMARY   CHIEF COMPLAINT:  Symptomatic left carotid stenosis.   HISTORY OF PRESENT ILLNESS:  Ms. Winne is a 60 year old female who was  seen by Dr. Myra Gianotti for evaluation of carotid disease.  A duplex scan  revealed 60-79% stenosis bilaterally.  The patient reports episodes of  left facial numbness over the past 1-2 years and several episodes of  right-sided arm paresthesias lasted for several minutes and then  resolved.  She had an episode where she had difficulty with her speech  and where her memory had disappeared.  She was sent out Dr. Pearlean Brownie for  further evaluation and MRI showed 50% proximal right internal carotid  artery stenosis and 75% left internal carotid artery stenosis.  It was  felt because of the patient's symptoms, she will undergo a left carotid  endarterectomy.   PAST MEDICAL HISTORY:  Significant for fibromyalgia, history of MI with  coronary stenting, diabetes, osteoarthritis, gastroesophageal reflux  disease, depression, anxiety, hypertension, and coronary artery disease.   SOCIAL HISTORY:  The patient quit smoking in 2010.  She smoked for 45  years.   ALLERGIES:  CODEINE, MORPHINE, and REGLAN.   HOSPITAL COURSE:  The patient was taken to the operating room on February 28, 2009, for a left carotid endarterectomy with shunting and bovine  patch angioplasty.  The patient had a thickened posterior plaque well  into the proximal portion of her common carotid artery as well as plaque  at the bifurcation.  Postoperatively, the patient did well.  She had a  slight left facial droop.  She had no difficulty swallowing, no  tongue  deviation.  She had good and equal strength in bilateral upper and lower  extremities, and she is alert and oriented x3.  She did well overnight,  was resting comfortably.  Postoperative headache that she had had  resolved.  She remained afebrile.  Her vital signs were stable.  She was  ambulating, voiding, and taking food by mouth.  She again neurologically  was intact except for a slight facial droop on the left.  She had no  tongue deviation.  Good and equal strength in bilateral upper and lower  extremities.   DISCHARGE MEDICATIONS:  1. Lexapro 10 mg q.p.m.  2. Valium 10 mg every 6 hours as needed.  3. Percocet 5/325 one to two tabs q.4 h. p.r.n. pain, #20 given.  She      also has Percocet at home for her other ailments.  4. NicoDerm  patch 21 mg in the a.m.  5. BuSpar 10 mg 3 times a day.  6. Phenergan 25 mg as needed.  7. Xanax 1 mg every 6 hours.  8. Lasix 20 mg every other day.  9.  K-Dur 20 mEq daily.  10.Topamax 25 mg at bedtime.  11.Metformin 500 mg q.a.m.  12.Plavix 75 mg in the a.m.  13.Simvastatin 80 mg in the p.m.  14.Aspirin 325 mg in the a.m.  15.Kapidex 60 mg in the a.m.  16.Maxzide half tab 37.5/25 q.a.m.  17.Folic acid 1 mg daily.  18.Prednisone 10 mg in the a.m.  19.Relafen 750 mg twice a day.  20.Doxepin 50 mg in the p.m.  21.Soma 350 mg in the p.m.   DISPOSITION:  The patient will be discharged on March 01, 2009.  She  will follow up with Dr. Myra Gianotti in 2 weeks post discharge.   INSTRUCTIONS:  Both verbal and written were given to the patient.  She  shows understanding.  She will be going home to stay with her daughter.      Della Goo, PA-C      V. Charlena Cross, MD  Electronically Signed    RR/MEDQ  D:  03/01/2009  T:  03/01/2009  Job:  [id number]

## 2010-11-29 NOTE — Consult Note (Signed)
NAME:  [name], [name]                      ACCOUNT NO.:  [id number]   MEDICAL RECORD NO.:  [id number]                  PATIENT TYPE:   LOCATION:                                       FACILITY:   PHYSICIAN:  Aundra Dubin, M.D.            DATE OF BIRTH:   DATE OF CONSULTATION:  DATE OF DISCHARGE:                                   CONSULTATION   REFERRING PHYSICIAN:  Doreen Beam, M.D.   CHIEF COMPLAINT:  Arthritis and fibromyalgia.   Dear Herminio Commons:   Thank you for this consultation.  The patient is a 60 year old white female  who has had a diagnosis of fibromyalgia since 1994.  She continues to hurt  all over.  She has chest pain that has been worked up in the past.  She has  bad headaches very frequently.  She says I am just sore in all of my  muscles.  She has nonrestorative sleep.  She has significant fatigue and  cannot do her housework.  She was having a bad flair of pain on 08/15/2002,  and her blood pressure medicines were adjusted along with going on MS Contin  and an increase in her doxepin.  She says that she is sleeping better, and  this is the first time she has slept in several months.   Another problem is her left knee.  This has bothered her severely for a  number of months.  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  I have  reviewed a left knee MRI report from 08/15/2002 showing significant OA changes  to the lateral compartment along with the patellofemoral joint.  There is  grade 4 articular cartilage loss in these areas.  She has a stellate-  appearing tear of the lateral meniscus.  There is a slight Baker's cyst.  She has difficulty straightening the knee and putting her weight on it.  This can give her a sharp and burning pain.  She has had arthroscopy in the  last year.   REVIEW OF SYSTEMS:  On further review of systems, over the last several  months she feels that her weight has decreased by 20 pounds.  She has had  some rash to come and go on  her chest.  She has significant  anxiousness/anxiety.  There has been no photosensitivity, oral ulcers,  alopecia, pleurisy, or Raynaud's.  She has a pattern of alternating loose  stool and constipation.  There has been no blood or mucous to the BM.  Her  back hurts a great deal.   PAST MEDICAL/SURGICAL HISTORY:  1. Fibromyalgia.  2. L5 laminectomy.  3. Tonsillectomy in 1969.  4. Abdominal exploratory surgery and tubal ligation in 1973.  5. D&C in 1975.  6. Hysterectomy in 1977.  7. Cholecystectomy in 1991.  8. Right breast biopsy in 1993.  9. Bilateral arthroscopic knee surgery in 2003.    MEDICATIONS:  1. Doxepin 100 mg q.h.s.  2.  Nexium 40 mg daily.  3. Avalide 300/12.5 mg daily.  4. MS Contin 15 mg b.i.d.  5. Valium 5 mg t.i.d.  6. Mobic 15 mg daily.  7. Ambien 10 mg p.r.n.  8. Hydroxyzine p.r.n.   ALLERGIES:  XENICAL, PROTONIX, ACIPHEX, PROPULSID, VIOXX, CELEBREX,  __________ , ARTHROTEC, DAYPRO, CODEINE, PARAFON FORTE, LOPRESSOR.   FAMILY HISTORY:  Her father died at age 49 after a heart attack.  Her mother  had hardening of the arteries and died at age 35.   SOCIAL HISTORY:  She is an [place] native.  She has been divorced for 20 years,  and she has three grown children.  Several of her children have had serious  illnesses.  She has been disabled since 1999 because of her back,  fibromyalgia, and knees.  She began smoking at age 49 and smokes one-half  pack of cigarettes a day.  No alcohol.   PHYSICAL EXAMINATION:  VITAL SIGNS:  Weight 169 pounds, height 59.5 inches,  blood pressure 110/60, respirations 16.  GENERAL:  She moderately overweight and is in no distress.  EXTREMITIES:  Her gait is quite antalgic.  She uses a cane and is limping  because of pain in the left knee.  SKIN:  She has slightly poor capillary refill.  HEENT:  PERRL/EOMI.  Mouth with dentures.  No obvious ulcers to the tongue.  NECK:  Negative JVD.  She has significant fullness to the mandibular  glands.  Thyroid was nontender and normal.  LUNGS:  Clear.  HEART:  Regular, no murmur.  ABDOMEN:  Negative HSM with slight diffuse tenderness.  MUSCULOSKELETAL:  Hands, wrists, elbows, and shoulders have a good range of  motion and do not show arthritic changes.  The neck is quite stiff and has  20% decrease in lateral rotation.  Trigger points at the elbows, shoulder,  neck, occiput anterior, chest, upper paraspinous muscles, and low back were  all quite tender.  Hips with good range of motion.  Left knee is chronically  swollen.  There is no effusion.  She has poor flexion, stopping at about 95  degrees.  The knee did not fully extend to 0 degrees.  She had significant  medial more than lateral joint line tenderness.  The right knee was slightly  hypertrophic but cool.  It flexed better and with less tenderness to 125  degrees.  The ankles and feet were nonswollen and nontender.  NEUROLOGIC:  Strength is 5/5.  DTRs 2+ throughout and negative SLRs.   PROCEDURE:  Left knee injection.  The skin was marked and cleaned with  Betadine and alcohol swabs and cooled with ethyl chloride.  Using a 25-gauge  needle, 80 mg of Depo-Medrol and 1 cc of 2% lidocaine was injected.   ASSESSMENT AND PLAN:  1. Fibromyalgia.  I believe she does have a symptom complex to diagnose     this.  She has the insomnia, overall aching, headaches, fatigue, along     with positive trigger points.  She is on a good set of medicines that     have been recently adjusted.  If the doxepin is not helping some nights,     then she will take Ambien in addition.  2. Left knee pain.  This is a combination most likely of arthritis along     with cartilage and meniscus damage.  If this injection helps, I doubt     that it will improve more than about two months.  She did have  some     evidence of a slight Baker's cyst by the magnetic resonance imaging.  She    is on MS Contin and a nonsteroidal anti-inflammatory drug which  is     appropriate.  I suspect that she is going to need to have this knee     replaced over the next one to three years.  3. Smoking along with report of 20 pound weight loss.  We will have her go     for a chest x-ray.   Dhruv,  I believe Ms. Stare has the two main problems being the left knee  and a fibromyalgia-type problem.  She has chronic low back pain and a  history of the laminectomy in addition.  I believe overall the medicines she  is on are quite appropriate, and I am adding no further medicines.  Hopefully the above injection will give some good but expected temporary  relief.  I will see her back in two months.  Thank you.                                               Aundra Dubin, M.D.    WWT/MEDQ  D:  08/18/2002  T:  08/18/2002  Job:  [id number]   cc:   Doreen Beam  [address]  [place]  [place] [address]  Fax: [phone]

## 2010-11-29 NOTE — Op Note (Signed)
[name], [name]                          ACCOUNT NO.:  [id number]   MEDICAL RECORD NO.:  [id number]                   PATIENT TYPE:  INP   LOCATION:  2872                                 FACILITY:  [facility]   PHYSICIAN:  Hilda Lias, M.D.                DATE OF BIRTH:  Nov 07, 1950   DATE OF PROCEDURE:  01/17/2004  DATE OF DISCHARGE:                                 OPERATIVE REPORT   PREOPERATIVE DIAGNOSIS:  C4-5, C5-6, C6-7 stenosis and spondylosis with  radiculopathy.   POSTOPERATIVE DIAGNOSIS:  C4-5, C5-6, C6-7 stenosis and spondylosis with  radiculopathy.   PROCEDURES:  1. C5 corpectomy.  2. C6-7 diskectomy, decompressing the spinal cord.  3. Foraminotomy.  4. Interbody fusion with allograft.  5. Plate from C4 to C7.  6. Microscope.   SURGEON:  Hilda Lias, M.D.   CLINICAL HISTORY:  The patient was admitted because of neck pain with  radiation to both upper extremities.  The patient had weakness of the  deltoid, biceps and triceps.  X-rays showed severe spondylosis with stenosis  at C4-5, C5-6, and C6-7.  Surgery was advised and the risks were explained  in the history and physical.   PROCEDURE:  The patient was taken to the OR and after intubation, she was  positioned in a supine manner.  The neck was prepped with Betadine.  A  longitudinal incision through the skin and platysma was carried down. It was  difficult to enter the disk at the level of C4-5, it was quite calcified.  X-  ray showed that indeed we were at the level of C5-6.  We started using our  diskectomy at the level of C4-5.  The bone was quite osteoporotic up to the  point that we proceeded with a C5 corpectomy.  Decompression of the C5 and  C6 nerve roots was accomplished.  The posterior ligament was also excised.  It was quite adherent to the dura mater.  At the level of C6-7 we did a  diskectomy with decompression of the spinal cord and the nerve root.  Then a  piece of allograft from C4 to C6  was inserted with bone graft between C6 and  C7.  This was __________ with a plate using six screws.  Lateral C-spine  showed good position of the graft and the plate.  From then on the area was  irrigated.  A Jackson-Pratt drain was left and the wound was closed with  Vicryl and a Steri-Strip.                                               Hilda Lias, M.D.    EB/MEDQ  D:  01/17/2004  T:  01/18/2004  Job:  [id number]

## 2010-11-29 NOTE — Consult Note (Signed)
NAME:  [name], [name] NO.:  [id number]   MEDICAL RECORD NO.:  [id number]                   PATIENT TYPE:   LOCATION:                                       FACILITY:   PHYSICIAN:  Aundra Dubin, M.D.            DATE OF BIRTH:   DATE OF CONSULTATION:  11/24/2002  DATE OF DISCHARGE:                                   CONSULTATION   CHIEF COMPLAINT:  Left knee.   REASON FOR CONSULTATION:  The patient did not receive any lasting help to  the left knee after my injection.  She was seen by her orthopedist at  [facility] in March.  No further therapy is planned at this time.  I  suspect that they are waiting for her to decide on a knee replacement.  She  still hurts a great deal.  She has very limited motion and needs a cane to  ambulate.  Her weight is stable and is down one-half pound.  There have been  no URIs or fever.  She has some mild cough.  There have been no swollen  joints.   The chest x-ray of August 18, 2002 showed some questionable mild  atelectasis versus infiltrate.   MEDICATIONS:  1. Off MS Contin.  2. Hydrocodone 7.5/500 mg t.i.d.  3. Nexium 40 mg daily.  4. Avalide 300/12.5 mg daily.  5. Valium 5 mg t.i.d.  6. Mobic 15 mg daily.  7. Ambien 10 mg h.s.  8. Topamax one b.i.d.   PHYSICAL EXAMINATION:  VITAL SIGNS:  Weight 168 pounds, blood pressure  104/58, respiratory rate 16.  GENERAL:  No distress.  LUNGS:  Quiet with no wheeze.  HEART:  Regular.  MUSCULOSKELETAL:  The hands, wrists, elbows, and shoulders do not show  active arthritis.  The left knee is warm.  She holds it slightly in a 5-  degree flexion contracture.  It is very tender with flexion beyond 95  degrees.  She has significant medial joint line tenderness.  The right knee  is also mildly warm but less tender, with flexion to 120 degrees.   ASSESSMENT AND PLAN:  1. Left knee osteoarthritis/patellofemoral arthritis.  I suspect that there     is not a  great deal more that I can do for her.  She has had the knee     undergo arthroscopy in the past.  I suspect that she is now a candidate     for a TKR.  I have asked her to discuss her choice of physicians with Dr.     Sherril Croon.  I believe she is on good medicines to include the hydrocodone.     She has been through physical therapy and continues to do some at home     and is still quite disabled because of her knees.  2. Fibromyalgia.  She is now using the Ambien consistently.  3.  Smoking.  I have discussed with her strategies to quit.  4. Mildly abnormal chest x-ray.  We will have her go for a repeat x-ray at     this time.   She will return on a p.r.n. basis.                                               Aundra Dubin, M.D.    WWT/MEDQ  D:  11/24/2002  T:  11/24/2002  Job:  [id number]   cc:   Doreen Beam  [address]  [place]  [place] [address]  Fax: [phone]

## 2010-11-29 NOTE — H&P (Signed)
[name], [name]                          ACCOUNT NO.:  [id number]   MEDICAL RECORD NO.:  [id number]                   PATIENT TYPE:  INP   LOCATION:  NA                                   FACILITY:  [facility]   PHYSICIAN:  Hilda Lias, M.D.                DATE OF BIRTH:  [birth year]   DATE OF ADMISSION:  01/17/2004  DATE OF DISCHARGE:                                HISTORY & PHYSICAL   [name] is a lady who has been seen by me in the past because of back and  bilateral leg pain.  She had surgery in 1996 by orthopedic surgeon and since  then, according to her, she continues to have more back pain.  She has been  seen also in the spine center __________ .  She came complaining of neck  pain, arm pain and weakness.  The patient feels that the weakness in the  upper extremity is getting worse and because finally she wants to proceed  with surgery.   PAST MEDICAL HISTORY:  1. Lumbar discectomy by Dr. Simonne Come in 1996.  2. She had surgery of both knees.   ALLERGIES:  1. VIOXX.  2. CELEBREX.  3. CODEINE.  4. ZOCOR.   MEDICATIONS:  She is taking Xanax and Doxepin, Allegra and Nexium.   SOCIAL HISTORY:  The patient does not drink.  She smokes a half pack a day.   REVIEW OF SYMPTOMS:  Positive for IBS, high blood pressure, high cholesterol  and fibromyalgia.   PHYSICAL EXAMINATION:  HEENT:  Normal.  NECK:  The patient is able to flex, lateral extension of the neck causes  pain.  LUNGS:  There are some rhonchi bilaterally.  CARDIOVASCULAR:  Heart sounds normal.  ABDOMEN:  Normal.  EXTREMITIES:  Normal pulses.  NEUROLOGIC:  Mental status normal. Cranial nerves normal.  Strength:  She  has weakness of both deltoids, biceps and triceps.  She also has weakness of  dorsiflexion of both feet.  Sensation:  She complained of numbness which  involves mostly the thumb and index finger.  Reflexes are decreased in both  upper extremities.  In the lower extremities, she has weakness on  dorsiflexion.   Cervical spine x-rays show that she has severe case of degenerative disc  disease with stenosis at the level of 4-5, 5-6 and 6-7. In the lumbar spine,  she has severe case of arachnoiditis.  She has surgical scar in the left  side from previous surgery.   IMPRESSION:  1. Cervical spondylosis with stenosis with radiculopathy.  2. Chronic back pain with arachnoiditis.   RECOMMENDATIONS:  The patient is being admitted for two level anterior  cervical discectomy.  The patient knows the risks such as infection, CSF  leak, damage to vocal cords, damage to __________ , damage to the carotid  artery, bleeding, collapse of the bone graft because of the history of  smoking.  Hilda Lias, M.D.    EB/MEDQ  D:  01/17/2004  T:  01/17/2004  Job:  [id number]

## 2010-11-29 NOTE — Discharge Summary (Signed)
[name], [name]                          ACCOUNT NO.:  [id number]   MEDICAL RECORD NO.:  [id number]                   PATIENT TYPE:  INP   LOCATION:                                       FACILITY:  [facility]   PHYSICIAN:  Coletta Memos, M.D.                  DATE OF BIRTH:  Dec 08, 1950   DATE OF ADMISSION:  01/17/2004  DATE OF DISCHARGE:  01/20/2004                                 DISCHARGE SUMMARY   ADMISSION DIAGNOSES:  1. Cervical spondylosis.  2. Cervical displaced disk C4-5, C5-6, and C6-7.  3. Cervical stenosis.   DISCHARGE DIAGNOSES:  1. Cervical spondylosis.  2. Cervical displaced disk at C4-5, C5-6, and C6-7.  3. Cervical stenosis.   PROCEDURE:  C5 corpectomy, C6 diskectomy, interbody fusion with allograft,  anterior plating C4 to C7.   COMPLICATIONS:  None.   DISCHARGE STATUS:  Alive and well.   HISTORY OF PRESENT ILLNESS:  We took [name] to the operating room for  cervical stenosis, back pain, and radiation into both upper extremities.  She postoperatively has done well.  Pain is being controlled well with oral  medications.  She is tolerating a regular diet.  Strength at discharge is  5/5 in the upper extremities.  Memory, language, attention span, and fund of  knowledge are normal.  Wound is clean and dry without signs of infection.  She will be discharged home with Percocet, Valium, and orders for a hospital  bed and shower chair.                                                Coletta Memos, M.D.    KC/MEDQ  D:  01/20/2004  T:  01/21/2004  Job:  [id number]

## 2011-03-28 ENCOUNTER — Encounter: Payer: Self-pay | Admitting: Vascular Surgery

## 2011-05-16 ENCOUNTER — Other Ambulatory Visit: Payer: Self-pay

## 2011-05-16 ENCOUNTER — Ambulatory Visit: Payer: Self-pay | Admitting: Thoracic Diseases

## 2011-05-20 ENCOUNTER — Encounter: Payer: Self-pay | Admitting: Thoracic Diseases

## 2011-05-21 ENCOUNTER — Ambulatory Visit (INDEPENDENT_AMBULATORY_CARE_PROVIDER_SITE_OTHER): Payer: Medicaid Other | Admitting: Thoracic Diseases

## 2011-05-21 ENCOUNTER — Ambulatory Visit (INDEPENDENT_AMBULATORY_CARE_PROVIDER_SITE_OTHER): Payer: Medicaid Other | Admitting: *Deleted

## 2011-05-21 ENCOUNTER — Other Ambulatory Visit: Payer: Self-pay | Admitting: Thoracic Diseases

## 2011-05-21 VITALS — BP 168/50 | HR 80 | Resp 18 | Ht 59.0 in | Wt 115.0 lb

## 2011-05-21 DIAGNOSIS — I6529 Occlusion and stenosis of unspecified carotid artery: Secondary | ICD-10-CM

## 2011-05-21 DIAGNOSIS — Z48812 Encounter for surgical aftercare following surgery on the circulatory system: Secondary | ICD-10-CM

## 2011-05-21 NOTE — Patient Instructions (Signed)
Stroke Prevention Some medical conditions and some behaviors are associated with an increased chance of having a stroke. You may prevent a stroke by making healthy choices and managing medical conditions. You can reduce your risk of having a stroke by:  Staying physically active. It is recommended that you get at least 30 minutes of activity on most or all days.   Stop smoking.   Limiting alcohol use. Moderate alcohol use is considered to be: No more than 2 drinks per day for men. No more than 1 drink per day for non-pregnant women.   DIET.  A low fat, low cholesterol, low salt and high fiber diet with servings of fruits and vegetables daily will help .   Managing your cholesterol levels.. Take any prescribed medications to control cholesterol as directed by your caregiver.   Managing your diabetes. Diabetic diet as recommended by the ADA. Take any prescribed medications to control diabetes as directed by your caregiver.   Controlling your high blood pressure (hypertension). It is very important to take any prescribed medications to control hypertension .   Aspirin: Take Aspirin daily as prescribed by your physician. Do not take aspirin with blood thinners unless prescribed by your Physician  You may be on "blood thinners" (anticoagulants) Coumadin, Plavix, Agrennox, Effient. All these medications are helpful in reducing the risk of forming abnormal blood clots. If you have the irregular heart rhythm of atrial fibrillation, you may be on a blood thinner.  Be sure you understand all your medication instructions.   SIGNS OF STROKE: SEEK IMMEDIATE MEDICAL CARE IF: You have sudden weakness or numbness of the face, arm, or leg, especially on 1 side of the body.  You have sudden confusion.  You have trouble speaking (aphasia) or understanding.  You have sudden trouble seeing in 1 or both eyes.  You have sudden trouble walking.  You have dizziness.  You have a loss of balance or coordination.   You have a sudden severe headache with no known cause.    You have new chest pain, angina, or an irregular heartbeat.   ANY OF THESE SYMPTOMS MAY REPRESENT A SERIOUS PROBLEM THAT IS AN EMERGENCY. Do not wait to see if the symptoms will go away. Get medical help right away. Call your local emergency services (911 in [place] DO NOT drive yourself to the hospital. Stroke Prevention Some medical conditions and some behaviors are associated with an increased chance of having a stroke. You may prevent a stroke by making healthy choices and managing medical conditions. You can reduce your risk of having a stroke by:  Staying physically active. It is recommended that you get at least 30 minutes of activity on most or all days.   Not smoking.   Limiting alcohol use. Moderate alcohol use is considered to be:   No more than 2 drinks per day for men.   No more than 1 drink per day for nonpregnant women.   Eating healthy foods. Certain diets may be prescribed to address high blood pressure, high cholesterol, diabetes, or obesity. A diet that includes 5 or more servings of fruits and vegetables a day may reduce the risk of stroke.   Managing your cholesterol levels. A low saturated fat, low trans fat, low cholesterol, and high fiber diet may control cholesterol levels. Take any prescribed medications to control cholesterol as directed by your caregiver.   Managing your diabetes. A controlled carbohydrate, controlled sugar diet is recommended to manage diabetes. Take any prescribed medications to   control diabetes as directed by your caregiver.   Controlling your high blood pressure (hypertension). A low salt (sodium), low saturated fat, low trans fat, and low cholesterol diet is recommended to manage high blood pressure. Take any prescribed medications to control hypertension as directed by your caregiver.   Maintaining a healthy weight. A reduced calorie, low sodium, low saturated fat, low trans fat, low  cholesterol diet is recommended to manage weight.   Stopping drug abuse.   Taking medications as directed by your caregiver. For some individuals, aspirin or "blood thinners" (anticoagulants) are helpful in reducing the risk of forming abnormal blood clots that can lead to stroke. If you have the irregular heart rhythm of atrial fibrillation, you should be on a blood thinner unless there is a good reason you cannot take them. Be sure you understand all your medication instructions.  SEEK IMMEDIATE MEDICAL CARE IF:  You have sudden weakness or numbness of the face, arm, or leg, especially on 1 side of the body.   You have sudden confusion.   You have trouble speaking (aphasia) or understanding.   You have sudden trouble seeing in 1 or both eyes.   You have sudden trouble walking.   You have a loss of balance or coordination.   You have a sudden severe headache with no known cause.    ANY OF THESE SYMPTOMS MAY REPRESENT A SERIOUS PROBLEM THAT IS AN EMERGENCY. Do not wait to see if the symptoms will go away. Get medical help right away. Call your local emergency services (911 in [place] DO NOT drive yourself to the hospital. Document Released: 08/07/2004 Document Re-Released: 12/18/2009 [employer] Patient Information 2011 [employer].  

## 2011-05-21 NOTE — Progress Notes (Signed)
VASCULAR AND VEIN SURGERY CAROTID FOLLOW-UP  Date of Surgery: 08/082012 Surgeon: Dr. Myra Gianotti  HPI: [name] is a 60 y.o. female right handed who has known carotid disease. Patient is doing well. Pt. has had surgical intervention of left CEA .  She states the stenosis was very low and that Dr. Myra Gianotti would probably want to stent her right side if this was amenable became necessary as the left CEA was difficult.  She C/O new symptoms of depression as her daughter recently passed away.  Patient has Negative history of TIA or stroke symptom.  The patient denies amaurosis fugax or monocular blindness.  The patient  denies facial drooping.   Pt. denies headache Pt. denies hemiplegia.  The patient denies receptive or expressive aphasia.  Pt. denies weakness in BUE/BLE    Non-Invasive Vascular Imaging CAROTID DUPLEX 05/21/2011  Right ICA 60 - 79 % stenosis Left ICA 20 - 39 % stenosis  These findings are Unchanged from previous exam  Past Medical History  Diagnosis Date  . Hypertension   . Arthritis   . GERD (gastroesophageal reflux disease)   . Depression   . Diabetes mellitus   . Anxiety   . Leg pain   . Hiatal hernia   . Myocardial infarction 10/23/08  . Hyperlipidemia   . Carotid artery occlusion   . Fibromyalgia   . CAD (coronary artery disease)     Current Outpatient Prescriptions  Medication Sig Dispense Refill  . aspirin 325 MG EC tablet Take 325 mg by mouth daily.        . Carisoprodol (SOMA PO) Take by mouth at bedtime.        . citalopram (CELEXA) 20 MG tablet Take 20 mg by mouth daily.        . clopidogrel (PLAVIX) 75 MG tablet Take 75 mg by mouth daily.        Marland Kitchen dexlansoprazole (DEXILANT) 60 MG capsule Take 60 mg by mouth daily.        . diazepam (VALIUM) 5 MG tablet Take 5 mg by mouth every 6 (six) hours as needed.        . etodolac (LODINE) 300 MG capsule Take 300 mg by mouth every 8 (eight) hours.        . folic acid (FOLVITE) 1 MG tablet Take 1 mg by mouth  daily.        . Oxycodone-Acetaminophen (PERCOCET PO) Take by mouth 4 (four) times daily.        . Potassium (POTASSIMIN PO) Take by mouth daily.        Marland Kitchen SIMVASTATIN PO Take by mouth daily.        Marland Kitchen triamterene-hydrochlorothiazide (MAXZIDE) 75-50 MG per tablet Take 1 tablet by mouth daily.        . Vitamin D, Ergocalciferol, (DRISDOL) 50000 UNITS CAPS Take 50,000 Units by mouth.        . Amoxicillin-Pot Clavulanate (AUGMENTIN PO) Take by mouth daily.        . busPIRone (BUSPAR) 10 MG tablet Take 10 mg by mouth 3 (three) times daily.        Marland Kitchen DOXEPIN HCL PO Take by mouth daily.        . Escitalopram Oxalate (LEXAPRO PO) Take by mouth daily.        . Furosemide (LASIX PO) Take by mouth daily.        . furosemide (LASIX) 10 MG/ML solution Take 10 mg by mouth daily.        Marland Kitchen  metFORMIN (GLUMETZA) 500 MG (MOD) 24 hr tablet Take 500 mg by mouth daily with breakfast.        . nabumetone (RELAFEN) 750 MG tablet Take 750 mg by mouth 2 (two) times daily.        . nicotine (NICODERM CQ - DOSED IN MG/24 HOURS) 14 mg/24hr patch Place 1 patch onto the skin daily.        . predniSONE (DELTASONE) 2.5 MG tablet Take 2.5 mg by mouth daily.         Labs reviewed from outside lab showed NL CBC; BMET with creatinine of 1.05; cholesterol WNL  History   Social History  . Marital Status: Divorced    Spouse Name: N/A    Number of Children: N/A  . Years of Education: N/A   Occupational History  . Not on file.   Social History Main Topics  . Smoking status: Former Smoker -- 1.0 packs/day for 45 years    Types: Cigarettes    Quit date: 10/12/2008  . Smokeless tobacco: Never Used  . Alcohol Use: No  . Drug Use: No  . Sexually Active: Not on file   Other Topics Concern  . Not on file   Social History Narrative  . No narrative on file  Recent death of 45yo daughter  Physical Exam: Filed Vitals:   05/21/11 1352  BP: 168/50  Pulse: 80  Resp: 18  Height: 4\' 11"  (1.499 m)  Weight: 115 lb (52.164  kg)    Pt is A&O x 3 Gait is normal Positive Right carotid bruit/s Neuro Exam: Speech is fluent Negative weakness BUE/BLE Negative tongue deviation Negative facial droop  Assessment [name] is a 60 y.o. female who had left CEA 2 years ago  her  Carotid duplex scan today showed unchanged stenosis of 60-79% on right - she is asymtomatic from this. The Left CEA is patent with no evidence of restenosis  Plan: Follow-up in 6 months with Carotid Duplex scan and letter Advised pt. To call her PMD who had given her celexa for depression. She stated she could not tolerate this med but had been on 10mg  of lexapro in the past with good result.  Pt was given information regarding stroke symptoms and prevention  Clinic MD: CS Edilia Bo, MD

## 2011-05-27 NOTE — Procedures (Unsigned)
CAROTID DUPLEX EXAM  INDICATION:  Follow up carotid disease.  HISTORY: Diabetes:  Yes, on medication. Cardiac:  Yes. Hypertension:  Yes. Smoking:  Previously. Previous Surgery:  Left carotid endarterectomy, 02/28/2009, performed by Dr. Myra Gianotti. CV History:  Currently asymptomatic. Amaurosis Fugax No, Paresthesias No, Hemiparesis No.                                      RIGHT             LEFT Brachial systolic pressure:         122               119 Brachial Doppler waveforms:         Normal            Normal Vertebral direction of flow:        Antegrade         Antegrade DUPLEX VELOCITIES (cm/sec) CCA peak systolic                   M = 82, D = 257   117 ECA peak systolic                   297               90 ICA peak systolic                   259               95 ICA end diastolic                   68                32 PLAQUE MORPHOLOGY:                  Mixed             Intimal wall thickening PLAQUE AMOUNT:                      Moderate          Minimal PLAQUE LOCATION:                    ICA, CCA, ECA     CCA, ICA  IMPRESSION: 1. Right internal carotid artery velocities suggest a 60% to 79%     stenosis. 2. Right distal common carotid artery stenosis. 3. Patent left carotid endarterectomy site without evidence of     restenosis of the internal carotid artery.  ___________________________________________ V. Charlena Cross, MD  EM/MEDQ  D:  05/22/2011  T:  05/22/2011  Job:  [id number]

## 2011-07-16 ENCOUNTER — Ambulatory Visit (INDEPENDENT_AMBULATORY_CARE_PROVIDER_SITE_OTHER): Payer: Medicaid Other | Admitting: Cardiology

## 2011-07-16 ENCOUNTER — Encounter: Payer: Self-pay | Admitting: Cardiology

## 2011-07-16 VITALS — BP 152/75 | HR 74 | Ht 59.0 in | Wt 114.0 lb

## 2011-07-16 DIAGNOSIS — I2510 Atherosclerotic heart disease of native coronary artery without angina pectoris: Secondary | ICD-10-CM

## 2011-07-16 DIAGNOSIS — E785 Hyperlipidemia, unspecified: Secondary | ICD-10-CM

## 2011-07-16 DIAGNOSIS — I10 Essential (primary) hypertension: Secondary | ICD-10-CM

## 2011-07-16 DIAGNOSIS — I6529 Occlusion and stenosis of unspecified carotid artery: Secondary | ICD-10-CM

## 2011-07-16 MED ORDER — ATENOLOL 25 MG PO TABS: 25.0000 mg | ORAL_TABLET | Freq: Every day | ORAL | Status: DC

## 2011-07-16 NOTE — Assessment & Plan Note (Signed)
Followed by Dr. Edilia Bo with recent Dopplers noted.

## 2011-07-16 NOTE — Assessment & Plan Note (Addendum)
Symptomatically stable, ECG is normal. Continue medical therapy and observation. Atenolol 25 mg daily is being added as well - does not cross the blood-brain barrier so hopefully she will tolerate without CNS side effects/fatigue. I asked her to let us know how she does. If she does ultimately require knee surgery, she will need a followup Cardiolite for preoperative assessment. Followup scheduled in 3 months.

## 2011-07-16 NOTE — Assessment & Plan Note (Signed)
Blood pressure up today. Discussed diet and sodium restriction. Atenolol also being added as noted.

## 2011-07-16 NOTE — Progress Notes (Signed)
Clinical Summary Ms. Horvath is a 61 y.o.female presenting for followup. She was seen in January of last year. She lost her daughter in the last year and is still going through the grieving process. She does report compliance with her medications and has regular followup with Dr. Sherril Croon. Having some trouble with anxiety.  Fortunately, no active angina symptoms. Her ECG is reviewed and normal. Lab work from June showed BUN 11, creatinine 0.8, AST 17, ALT 10, cholesterol 146, triglycerides 89, HDL 54, LDL 74, potassium 4.2, TSH 1.5, hemoglobin 13.9, platelets 132.  She states that she is due to see an orthopedic surgeon regarding right-sided knee arthritis and also status post left knee replacement. Not certain whether she will require any surgery.  We reviewed her cardiac medications, discussed adding a low-dose beta blocker.   Allergies  Allergen Reactions  . Celecoxib     REACTION: itching  . Codeine     REACTION: itching  . Lamictal (Lamotrigine)   . Morphine And Related   . Pregabalin     REACTION: headaches,blurred vision  . Reglan   . Citalopram Palpitations    Heart rate increased    Medication list reviewed.  Past Medical History  Diagnosis Date  . Essential hypertension, benign   . GERD (gastroesophageal reflux disease)   . Depression   . Type 2 diabetes mellitus   . Anxiety   . Hiatal hernia   . NSTEMI (non-ST elevated myocardial infarction)     4/10  . Hyperlipidemia   . Carotid artery disease     Dr. Myra Gianotti, 60-79% RICA, s/p left CEA  . Fibromyalgia   . Coronary atherosclerosis of native coronary artery     DDS RCA/circumflex 4/10, LVEF 65    Past Surgical History  Procedure Date  . Carotid endarterectomy 02/28/2009  . Abdominal hysterectomy   . Tubal ligation   . Breast lumpectomy   . Cholecystectomy   . Lumbar disc surgery     L5-S1  . Total knee arthroplasty     Left  . Neck fusion     C4 to C6    Family History  Problem Relation Age of Onset    . Other Mother     Cerebral hemorrage  . Heart disease Father   . Heart disease Brother     Social History Ms. Harm reports that she quit smoking about 2 years ago. Her smoking use included Cigarettes. She has a 45 pack-year smoking history. She has never used smokeless tobacco. Ms. Surges reports that she does not drink alcohol.  Review of Systems As outlined above, otherwise stable appetite. No syncope. No palpitations.  Physical Examination Filed Vitals:   07/16/11 1530  BP: 152/75  Pulse: 74    Normally nourished appearing woman in no acute distress.  HEENT: Conjunctiva and lids normal, oropharynx clear.  Neck: Supple, no elevated JVP, bilateral carotid bruits noted, right greater than left. Well-healed CEA scar on left.  Lungs: Clear to auscultation, diminished, nonlabored.  Cardiac: Regular rate and rhythm, no S3 or rub. Soft systolic murmur and S4. Abdomen: Soft, nontender, bowel sounds present.  Extremities: No pitting edema.  Skin: Warm and dry.  Musculoskeletal: No kyphosis.  Neuropsychiatric: Alert and oriented x3, appropriate.   Problem List and Plan

## 2011-07-16 NOTE — Assessment & Plan Note (Signed)
Lipids look good with LDL at goal back in June, normal liver function tests on high-dose Zocor. She has been on this for greater than a year. No definite indication to change this point.

## 2011-07-16 NOTE — Patient Instructions (Signed)
Your physician wants you to follow-up in: 3 months. CALL THE OFFICE IN 1 MONTH TO SCHEDULE APPT.  Start Atenolol 25 mg daily.

## 2011-09-12 HISTORY — PX: JOINT REPLACEMENT: SHX530

## 2011-10-16 ENCOUNTER — Ambulatory Visit (INDEPENDENT_AMBULATORY_CARE_PROVIDER_SITE_OTHER): Payer: Medicaid Other | Admitting: Cardiology

## 2011-10-16 ENCOUNTER — Encounter: Payer: Self-pay | Admitting: Cardiology

## 2011-10-16 VITALS — BP 134/73 | HR 64 | Ht 59.0 in | Wt 118.0 lb

## 2011-10-16 DIAGNOSIS — E785 Hyperlipidemia, unspecified: Secondary | ICD-10-CM

## 2011-10-16 DIAGNOSIS — I2510 Atherosclerotic heart disease of native coronary artery without angina pectoris: Secondary | ICD-10-CM

## 2011-10-16 DIAGNOSIS — I6529 Occlusion and stenosis of unspecified carotid artery: Secondary | ICD-10-CM

## 2011-10-16 DIAGNOSIS — I10 Essential (primary) hypertension: Secondary | ICD-10-CM

## 2011-10-16 NOTE — Progress Notes (Signed)
Clinical Summary [name] is a 61 y.o.female presenting for followup. She was seen in January. She states that she has been doing relatively well since last visit. She did have left knee surgery in March, states she had a screw taken out, tolerated this well. She is doing rehabilitation now, plans to start water aerobics soon.  She reports no angina, no palpitations. Reports tolerating the addition of atenolol. She is due to see Dr. Sherril Croon soon with full set of lab work.  Continues to smoke cigarettes, and has not been able to quit. We continue to discuss this.  She tells me that her glucometer has been reading out unrealistic, very low glucose levels, with no symptomatology. States that her strips are "old." She plans to bring this to Dr. Sherril Croon to get it checked.   Allergies  Allergen Reactions  . Celecoxib     REACTION: itching  . Codeine     REACTION: itching  . Lamictal (Lamotrigine)   . Morphine And Related   . Pregabalin     REACTION: headaches,blurred vision  . Reglan   . Citalopram Palpitations    Heart rate increased  . Zoloft Other (See Comments)    Stomach upset    Current Outpatient Prescriptions  Medication Sig Dispense Refill  . aspirin 325 MG EC tablet Take 325 mg by mouth daily.        Marland Kitchen atenolol (TENORMIN) 25 MG tablet Take 1 tablet (25 mg total) by mouth daily.  30 tablet  6  . carisoprodol (SOMA) 350 MG tablet Take 350 mg by mouth at bedtime.        . clopidogrel (PLAVIX) 75 MG tablet Take 75 mg by mouth daily.        Marland Kitchen dexlansoprazole (DEXILANT) 60 MG capsule Take 60 mg by mouth daily.        . diazepam (VALIUM) 10 MG tablet Take 10 mg by mouth every 8 (eight) hours as needed.      . etodolac (LODINE) 300 MG capsule Take 300 mg by mouth 2 (two) times daily as needed.       . folic acid (FOLVITE) 1 MG tablet Take 1 mg by mouth daily.        . Omega-3 Fatty Acids (FISH OIL) 1200 MG CAPS Take 1 capsule by mouth 3 (three) times daily.        Marland Kitchen  oxyCODONE-acetaminophen (PERCOCET) 10-325 MG per tablet Take 1 tablet by mouth every 6 (six) hours as needed.        . potassium chloride (MICRO-K) 10 MEQ CR capsule Take 10 mEq by mouth daily.        . promethazine (PHENERGAN) 25 MG tablet Take 25 mg by mouth every 6 (six) hours as needed.        . simvastatin (ZOCOR) 80 MG tablet Take 80 mg by mouth at bedtime.        . sucralfate (CARAFATE) 1 G tablet Take 1 g by mouth 4 (four) times daily.      Marland Kitchen triamterene-hydrochlorothiazide (MAXZIDE-25) 37.5-25 MG per tablet Take 0.5 tablets by mouth daily.        Marland Kitchen venlafaxine (EFFEXOR) 37.5 MG tablet Take 37.5 mg by mouth daily.      . Vitamin D, Ergocalciferol, (DRISDOL) 50000 UNITS CAPS Take 50,000 Units by mouth every 7 (seven) days.         Past Medical History  Diagnosis Date  . Essential hypertension, benign   . GERD (gastroesophageal reflux disease)   .  Depression   . Type 2 diabetes mellitus   . Anxiety   . Hiatal hernia   . NSTEMI (non-ST elevated myocardial infarction)     4/10  . Hyperlipidemia   . Carotid artery disease     Dr. Myra Gianotti, 60-79% RICA, s/p left CEA  . Fibromyalgia   . Coronary atherosclerosis of native coronary artery     DES RCA/circumflex 4/10, LVEF 65    Social History [name] reports that she quit smoking about 3 years ago. Her smoking use included Cigarettes. She has a 45 pack-year smoking history. She has never used smokeless tobacco. [name] reports that she does not drink alcohol.  Review of Systems Negative except as outlined above.  Physical Examination Filed Vitals:   10/16/11 0913  BP: 134/73  Pulse: 64   Normally nourished appearing woman in no acute distress.  HEENT: Conjunctiva and lids normal, oropharynx clear.  Neck: Supple, no elevated JVP, bilateral carotid bruits noted, right greater than left. Well-healed CEA scar on left.  Lungs: Clear to auscultation, diminished, nonlabored.  Cardiac: Regular rate and rhythm, no S3 or rub.  Soft systolic murmur and S4.  Abdomen: Soft, nontender, bowel sounds present.  Extremities: No pitting edema. Well-healing small incisions left knee. Suture in place. Skin: Warm and dry.  Musculoskeletal: No kyphosis.  Neuropsychiatric: Alert and oriented x3, appropriate.    Problem List and Plan

## 2011-10-16 NOTE — Assessment & Plan Note (Signed)
No active angina on medical therapy. Continue observation, diet and exercise.

## 2011-10-16 NOTE — Assessment & Plan Note (Signed)
She has tolerated atenolol, and blood pressure trend is better.

## 2011-10-16 NOTE — Assessment & Plan Note (Signed)
She is due for followup lab work with Dr. Sherril Croon soon.

## 2011-10-16 NOTE — Assessment & Plan Note (Signed)
Followed by Dr Brabham 

## 2011-10-16 NOTE — Patient Instructions (Signed)
Your physician wants you to follow-up in: 6 months. You will receive a reminder letter in the mail one-two months in advance. If you don't receive a letter, please call our office to schedule the follow-up appointment. Your physician recommends that you continue on your current medications as directed. Please refer to the Current Medication list given to you today. 

## 2011-11-18 ENCOUNTER — Other Ambulatory Visit: Payer: Medicaid Other

## 2011-11-25 ENCOUNTER — Ambulatory Visit (INDEPENDENT_AMBULATORY_CARE_PROVIDER_SITE_OTHER): Payer: Medicaid Other | Admitting: Neurosurgery

## 2011-11-25 ENCOUNTER — Encounter: Payer: Self-pay | Admitting: Neurosurgery

## 2011-11-25 ENCOUNTER — Other Ambulatory Visit (INDEPENDENT_AMBULATORY_CARE_PROVIDER_SITE_OTHER): Payer: Medicaid Other | Admitting: *Deleted

## 2011-11-25 VITALS — BP 148/60 | HR 61 | Resp 16 | Ht 59.0 in | Wt 117.0 lb

## 2011-11-25 DIAGNOSIS — I6529 Occlusion and stenosis of unspecified carotid artery: Secondary | ICD-10-CM

## 2011-11-25 DIAGNOSIS — Z48812 Encounter for surgical aftercare following surgery on the circulatory system: Secondary | ICD-10-CM

## 2011-11-25 NOTE — Progress Notes (Signed)
[facility] HISTORY AND PHYSICAL   CC: Six-month carotid duplex for known stenosis Referring Physician: Brabham  History of Present Illness: 61 year old female patient of Dr. Myra Gianotti followed for known carotid stenosis. Patient underwent a left CEA in 2010 with Dr. Myra Gianotti has had no problems since that time. She is asymptomatic, she has no signs or symptoms of CVA, TIA, amaurosis fugax or word finding difficulty, she has no neurologic deficits.  Past Medical History  Diagnosis Date  . Essential hypertension, benign   . GERD (gastroesophageal reflux disease)   . Depression   . Type 2 diabetes mellitus   . Anxiety   . Hiatal hernia   . NSTEMI (non-ST elevated myocardial infarction)     4/10  . Hyperlipidemia   . Carotid artery disease     Dr. Myra Gianotti, 60-79% RICA, s/p left CEA  . Fibromyalgia   . Coronary atherosclerosis of native coronary artery     DES RCA/circumflex 4/10, LVEF 65  . Diabetes mellitus April 2010    ROS: [x]  Positive   [ ]  Denies    General: [ ]  Weight loss, [ ]  Fever, [ ]  chills Neurologic: [ ]  Dizziness, [ ]  Blackouts, [ ]  Seizure [ ]  Stroke, [ ]  "Mini stroke", [ ]  Slurred speech, [ ]  Temporary blindness; [ ]  weakness in arms or legs, [ ]  Hoarseness Cardiac: [ ]  Chest pain/pressure, [ ]  Shortness of breath at rest [ ]  Shortness of breath with exertion, [ ]  Atrial fibrillation or irregular heartbeat Vascular: [ ]  Pain in legs with walking, [ ]  Pain in legs at rest, [ ]  Pain in legs at night,  [ ]  Non-healing ulcer, [ ]  Blood clot in vein/DVT,   Pulmonary: [ ]  Home oxygen, [ ]  Productive cough, [ ]  Coughing up blood, [ ]  Asthma,  [ ]  Wheezing Musculoskeletal:  [ ]  Arthritis, [ ]  Low back pain, [ ]  Joint pain Hematologic: [ ]  Easy Bruising, [ ]  Anemia; [ ]  Hepatitis Gastrointestinal: [ ]  Blood in stool, [ ]  Gastroesophageal Reflux/heartburn, [ ]  Trouble swallowing Urinary: [ ]  chronic Kidney disease, [ ]  on HD - [ ]  MWF or [ ]   TTHS, [ ]  Burning with urination, [ ]  Difficulty urinating Skin: [ ]  Rashes, [ ]  Wounds Psychological: [ ]  Anxiety, [ ]  Depression   Social History History  Substance Use Topics  . Smoking status: Former Smoker -- 1.0 packs/day for 45 years    Types: Cigarettes    Quit date: 10/12/2008  . Smokeless tobacco: Never Used  . Alcohol Use: No    Family History Family History  Problem Relation Age of Onset  . Other Mother     Cerebral hemorrage  . Heart disease Mother   . Heart attack Mother   . Heart disease Father   . Heart attack Father   . Heart disease Brother     Heart Disease before age 33  . Diabetes Brother   . Hypertension Brother   . Heart attack Brother   . Cancer Sister   . Arthritis Sister   . Heart disease Sister   . Heart disease Daughter 8    Heart Disease before age 77  . Heart attack Daughter   . Cancer Sister   . Diabetes Brother     Varicose  Veins  . Peripheral vascular disease Brother   . Diabetes Brother     Allergies  Allergen Reactions  . Celecoxib     REACTION: itching  .  Codeine     REACTION: itching  . Lamictal (Lamotrigine)   . Lyrica (Pregabalin)     REACTION: headaches,blurred vision  . Metoclopramide Hcl   . Morphine And Related   . Citalopram Palpitations    Heart rate increased  . Sertraline Hcl Other (See Comments)    Stomach upset    Current Outpatient Prescriptions  Medication Sig Dispense Refill  . aspirin 325 MG EC tablet Take 325 mg by mouth daily.        Marland Kitchen atenolol (TENORMIN) 25 MG tablet Take 1 tablet (25 mg total) by mouth daily.  30 tablet  6  . carisoprodol (SOMA) 350 MG tablet Take 350 mg by mouth at bedtime.        . clopidogrel (PLAVIX) 75 MG tablet Take 75 mg by mouth daily.        Marland Kitchen dexlansoprazole (DEXILANT) 60 MG capsule Take 60 mg by mouth daily.        Marland Kitchen etodolac (LODINE) 300 MG capsule Take 300 mg by mouth 2 (two) times daily as needed.       . folic acid (FOLVITE) 1 MG tablet Take 1 mg by mouth  daily.        . Milnacipran HCl (SAVELLA) 12.5 MG TABS Take 12.5 mg by mouth at bedtime.      . Omega-3 Fatty Acids (FISH OIL) 1200 MG CAPS Take 1 capsule by mouth 3 (three) times daily.        Marland Kitchen oxyCODONE-acetaminophen (PERCOCET) 10-325 MG per tablet Take 1 tablet by mouth every 6 (six) hours as needed.        . potassium chloride (MICRO-K) 10 MEQ CR capsule Take 10 mEq by mouth daily.        . promethazine (PHENERGAN) 25 MG tablet Take 25 mg by mouth every 6 (six) hours as needed.        . simvastatin (ZOCOR) 80 MG tablet Take 80 mg by mouth at bedtime.        . sucralfate (CARAFATE) 1 G tablet Take 1 g by mouth 4 (four) times daily.      Marland Kitchen triamterene-hydrochlorothiazide (MAXZIDE-25) 37.5-25 MG per tablet Take 0.5 tablets by mouth daily.        Marland Kitchen venlafaxine (EFFEXOR) 37.5 MG tablet Take 37.5 mg by mouth daily.      . Vitamin D, Ergocalciferol, (DRISDOL) 50000 UNITS CAPS Take 50,000 Units by mouth every 7 (seven) days.       . diazepam (VALIUM) 10 MG tablet Take 10 mg by mouth every 8 (eight) hours as needed.        Physical Examination  Filed Vitals:   11/25/11 1441  BP: 148/60  Pulse: 61  Resp: 16    Body mass index is 23.63 kg/(m^2).  General:  WDWN in NAD Gait: Normal HEENT: WNL Eyes: Pupils equal Pulmonary: normal non-labored breathing , without Rales, rhonchi,  wheezing Cardiac: RRR, without  Murmurs, rubs or gallops; Abdomen: soft, NT, no masses Skin: no rashes, ulcers noted  Vascular Exam Pulses: 2+ radial pulses bilaterally Carotid bruits: Left-sided carotid pulse to auscultation, right sided bruit Extremities without ischemic changes, no Gangrene , no cellulitis; no open wounds;  Musculoskeletal: no muscle wasting or atrophy   Neurologic: A&O X 3; Appropriate Affect ; SENSATION: normal; MOTOR FUNCTION:  moving all extremities equally. Speech is fluent/normal  Non-Invasive Vascular Imaging CAROTID DUPLEX 11/25/2011  Right ICA 60 - 79 % stenosis Left ICA 20 - 39  % stenosis   ASSESSMENT/PLAN:  Assessment as above, patient has high-grade right ICA stenosis and will need to be followed closely. She'll return in 6 months for repeat carotid duplex, she knows she has any signs or symptoms of CVA which were discussed to go to the nearest emergency room she agrees. Her questions were encouraged and answered.  Lauree Chandler ANP   Clinic MD: Early

## 2011-11-26 NOTE — Progress Notes (Signed)
Addended by: Sharee Pimple on: 11/26/2011 08:39 AM   Modules accepted: Orders

## 2011-12-09 NOTE — Procedures (Unsigned)
CAROTID DUPLEX EXAM  INDICATION:  Follow up carotid disease.  HISTORY: Diabetes:  Yes. Cardiac:  Yes. Hypertension:  Yes. Smoking:  Previously. Previous Surgery:  Left CEA 02/28/2009 by Dr. Myra Gianotti. CV History:  Currently asymptomatic. Amaurosis Fugax No, Paresthesias No, Hemiparesis No                                      RIGHT             LEFT Brachial systolic pressure:         140               142 Brachial Doppler waveforms:         Normal            Normal Vertebral direction of flow:        Antegrade         Antegrade DUPLEX VELOCITIES (cm/sec) CCA peak systolic                   M=101, D=259      82 ECA peak systolic                   272               124 ICA peak systolic                   309               101 ICA end diastolic                   73                31 PLAQUE MORPHOLOGY:                  Mixed             Intimal wall thickening PLAQUE AMOUNT:                      Moderate          Minimal PLAQUE LOCATION:                    ICA, ECA, CCA     CCA  IMPRESSION: 1. Right internal carotid artery velocities suggest 60-79% stenosis     (high end of range). 2. Right distal common carotid artery stenosis. 3. Patent left carotid endarterectomy site with no evidence of re-     stenosis. 4. Relatively stable in comparison to the previous exam performed     05/21/2011.       ___________________________________________ V. Charlena Cross, MD  EM/MEDQ  D:  11/25/2011  T:  11/25/2011  Job:  [id number]

## 2012-04-14 ENCOUNTER — Ambulatory Visit (INDEPENDENT_AMBULATORY_CARE_PROVIDER_SITE_OTHER): Payer: Medicaid Other | Admitting: Cardiology

## 2012-04-14 ENCOUNTER — Encounter: Payer: Self-pay | Admitting: Cardiology

## 2012-04-14 VITALS — BP 174/70 | HR 57 | Ht 59.0 in | Wt 118.0 lb

## 2012-04-14 DIAGNOSIS — R5381 Other malaise: Secondary | ICD-10-CM

## 2012-04-14 DIAGNOSIS — Z1321 Encounter for screening for nutritional disorder: Secondary | ICD-10-CM

## 2012-04-14 DIAGNOSIS — I10 Essential (primary) hypertension: Secondary | ICD-10-CM

## 2012-04-14 DIAGNOSIS — Z13228 Encounter for screening for other metabolic disorders: Secondary | ICD-10-CM

## 2012-04-14 DIAGNOSIS — Z79899 Other long term (current) drug therapy: Secondary | ICD-10-CM

## 2012-04-14 DIAGNOSIS — I6529 Occlusion and stenosis of unspecified carotid artery: Secondary | ICD-10-CM

## 2012-04-14 DIAGNOSIS — E785 Hyperlipidemia, unspecified: Secondary | ICD-10-CM

## 2012-04-14 DIAGNOSIS — I2510 Atherosclerotic heart disease of native coronary artery without angina pectoris: Secondary | ICD-10-CM

## 2012-04-14 DIAGNOSIS — R0602 Shortness of breath: Secondary | ICD-10-CM

## 2012-04-14 DIAGNOSIS — R531 Weakness: Secondary | ICD-10-CM

## 2012-04-14 NOTE — Assessment & Plan Note (Signed)
She has multiple complaints today including shortness of breath and weakness. Likely multifactorial. We have not recently reassessed her LV function however, and an echocardiogram will be obtained to ensure stability. Lab work also to be sent including TSH, CMET, and vitamin D level. Plan to forward to Dr. Sherril Croon.

## 2012-04-14 NOTE — Assessment & Plan Note (Signed)
Followup fasting lipid profile.

## 2012-04-14 NOTE — Patient Instructions (Addendum)
Your physician recommends that you schedule a follow-up appointment in: 6 months. You will receive a reminder letter in the mail in about 4 months reminding you to call and schedule your appointment. If you don't receive this letter, please contact our office. Your physician recommends that you continue on your current medications as directed. Please refer to the Current Medication list given to you today.  Your physician has requested that you have an echocardiogram. Echocardiography is a painless test that uses sound waves to create images of your heart. It provides your doctor with information about the size and shape of your heart and how well your heart's chambers and valves are working. This procedure takes approximately one hour. There are no restrictions for this procedure. Your physician recommends that you return for a FASTING lipid,CMET,TSH,vitamin D level profiles at [facility] on 04/15/12.

## 2012-04-14 NOTE — Assessment & Plan Note (Signed)
She does not report any angina and her ECG is stable. Plan to continue medical therapy and observation for now.

## 2012-04-14 NOTE — Assessment & Plan Note (Signed)
Blood pressure is elevated today. Might consider adding an ACE inhibitor if tolerated.

## 2012-04-14 NOTE — Progress Notes (Signed)
Clinical Summary [name] is a 61 y.o.female presenting for followup. She was seen in April. She has multiple complaints today including weakness, shortness of breath, recent "flu" treated with antibiotics, "knots" in her back that are painful, also pain in her legs, headaches.  She had followup with Dr. Arbie [name] for vascular disease, carotids in May showed 60-79% RICA and 20% LICA.  ECG reviewed today shows sinus rhythm, no acute ST segment changes. She reports some swelling in her legs, although this is not apparent on examination today. I reviewed her cardiac medications. She reports no bleeding. Has not had followup blood work in some time.   Allergies  Allergen Reactions  . Celecoxib     REACTION: itching  . Codeine     REACTION: itching  . Lamictal (Lamotrigine)   . Lyrica (Pregabalin)     REACTION: headaches,blurred vision  . Metoclopramide Hcl   . Morphine And Related   . Trazodone And Nefazodone     hallucinations  . Venlafaxine     Affected eyes  . Citalopram Palpitations    Heart rate increased  . Sertraline Hcl Other (See Comments)    Stomach upset    Current Outpatient Prescriptions  Medication Sig Dispense Refill  . aspirin 325 MG EC tablet Take 325 mg by mouth daily.        Marland Kitchen atenolol (TENORMIN) 25 MG tablet Take 1 tablet (25 mg total) by mouth daily.  30 tablet  6  . carisoprodol (SOMA) 350 MG tablet Take 350 mg by mouth at bedtime.        . clopidogrel (PLAVIX) 75 MG tablet Take 75 mg by mouth daily.        Marland Kitchen dexlansoprazole (DEXILANT) 60 MG capsule Take 60 mg by mouth daily.        . diazepam (VALIUM) 10 MG tablet Take 10 mg by mouth every 8 (eight) hours as needed.      . folic acid (FOLVITE) 1 MG tablet Take 1 mg by mouth daily.        . Omega-3 Fatty Acids (FISH OIL) 1200 MG CAPS Take 1 capsule by mouth 3 (three) times daily.        Marland Kitchen oxyCODONE-acetaminophen (PERCOCET) 10-325 MG per tablet Take 1 tablet by mouth every 6 (six) hours as needed.        .  simvastatin (ZOCOR) 40 MG tablet Take 60 mg by mouth every evening.      . triamterene-hydrochlorothiazide (MAXZIDE-25) 37.5-25 MG per tablet Take 0.5 tablets by mouth daily.        . Vitamin D, Ergocalciferol, (DRISDOL) 50000 UNITS CAPS Take 50,000 Units by mouth every 7 (seven) days.       . nitroGLYCERIN (NITROSTAT) 0.4 MG SL tablet Place 0.4 mg under the tongue every 5 (five) minutes as needed.      . potassium chloride (MICRO-K) 10 MEQ CR capsule Take 10 mEq by mouth as needed.         Past Medical History  Diagnosis Date  . Essential hypertension, benign   . GERD (gastroesophageal reflux disease)   . Depression   . Type 2 diabetes mellitus   . Anxiety   . Hiatal hernia   . NSTEMI (non-ST elevated myocardial infarction)     4/10  . Hyperlipidemia   . Carotid artery disease     Dr. Myra [name], 60-79% RICA, s/p left CEA  . Fibromyalgia   . Coronary atherosclerosis of native coronary artery     DES  RCA/circumflex 4/10, LVEF 65  . Diabetes mellitus April 2010    Social History [name] reports that she quit smoking about 3 years ago. Her smoking use included Cigarettes. She has a 45 pack-year smoking history. She has never used smokeless tobacco. [name] reports that she does not drink alcohol.  Review of Systems No palpitations or syncope. Does have intermittent lightheadedness. No orthopnea or PND. Stable appetite. Otherwise negative.  Physical Examination Filed Vitals:   04/14/12 1328  BP: 174/70  Pulse: 57   Filed Weights   04/14/12 1319  Weight: 118 lb (53.524 kg)   HEENT: Conjunctiva and lids normal, oropharynx clear.  Neck: Supple, no elevated JVP, bilateral carotid bruits noted, right greater than left. Well-healed CEA scar on left.  Lungs: Clear to auscultation, diminished, nonlabored.  Cardiac: Regular rate and rhythm, no S3 or rub. Soft systolic murmur and S4.  Abdomen: Soft, nontender, bowel sounds present.  Extremities: No pitting edema. Well-healing  small incisions left knee. Suture in place.  Skin: Warm and dry.  Musculoskeletal: No kyphosis.  Neuropsychiatric: Alert and oriented x3, appropriate.    Problem List and Plan   CAD, NATIVE VESSEL She does not report any angina and her ECG is stable. Plan to continue medical therapy and observation for now.  Shortness of breath She has multiple complaints today including shortness of breath and weakness. Likely multifactorial. We have not recently reassessed her LV function however, and an echocardiogram will be obtained to ensure stability. Lab work also to be sent including TSH, CMET, and vitamin D level. Plan to forward to Dr. Sherril Croon.  Essential hypertension, benign Blood pressure is elevated today. Might consider adding an ACE inhibitor if tolerated.  DYSLIPIDEMIA Followup fasting lipid profile.  CAROTID OCCLUSIVE DISEASE Keep followup with Dr. Arbie [name].    [name], M.D., F.A.C.C.

## 2012-04-14 NOTE — Assessment & Plan Note (Signed)
Keep followup with Dr. Early. 

## 2012-04-16 ENCOUNTER — Observation Stay (HOSPITAL_COMMUNITY)
Admission: EM | Admit: 2012-04-16 | Discharge: 2012-04-17 | Disposition: A | Discharge status: Home / Self Care | Payer: Medicaid Other | Source: Other Acute Inpatient Hospital | Attending: Cardiology | Admitting: Cardiology

## 2012-04-16 DIAGNOSIS — I2510 Atherosclerotic heart disease of native coronary artery without angina pectoris: Secondary | ICD-10-CM | POA: Diagnosis present

## 2012-04-16 DIAGNOSIS — R6889 Other general symptoms and signs: Secondary | ICD-10-CM | POA: Insufficient documentation

## 2012-04-16 DIAGNOSIS — K449 Diaphragmatic hernia without obstruction or gangrene: Secondary | ICD-10-CM | POA: Insufficient documentation

## 2012-04-16 DIAGNOSIS — I6529 Occlusion and stenosis of unspecified carotid artery: Secondary | ICD-10-CM | POA: Diagnosis present

## 2012-04-16 DIAGNOSIS — R42 Dizziness and giddiness: Secondary | ICD-10-CM | POA: Insufficient documentation

## 2012-04-16 DIAGNOSIS — I10 Essential (primary) hypertension: Secondary | ICD-10-CM

## 2012-04-16 DIAGNOSIS — I252 Old myocardial infarction: Secondary | ICD-10-CM | POA: Insufficient documentation

## 2012-04-16 DIAGNOSIS — F3289 Other specified depressive episodes: Secondary | ICD-10-CM | POA: Insufficient documentation

## 2012-04-16 DIAGNOSIS — K219 Gastro-esophageal reflux disease without esophagitis: Secondary | ICD-10-CM | POA: Insufficient documentation

## 2012-04-16 DIAGNOSIS — Z9861 Coronary angioplasty status: Secondary | ICD-10-CM | POA: Insufficient documentation

## 2012-04-16 DIAGNOSIS — IMO0001 Reserved for inherently not codable concepts without codable children: Secondary | ICD-10-CM | POA: Insufficient documentation

## 2012-04-16 DIAGNOSIS — E785 Hyperlipidemia, unspecified: Secondary | ICD-10-CM | POA: Diagnosis present

## 2012-04-16 DIAGNOSIS — E119 Type 2 diabetes mellitus without complications: Secondary | ICD-10-CM | POA: Insufficient documentation

## 2012-04-16 DIAGNOSIS — F17200 Nicotine dependence, unspecified, uncomplicated: Secondary | ICD-10-CM | POA: Insufficient documentation

## 2012-04-16 DIAGNOSIS — F411 Generalized anxiety disorder: Secondary | ICD-10-CM | POA: Insufficient documentation

## 2012-04-16 DIAGNOSIS — F329 Major depressive disorder, single episode, unspecified: Secondary | ICD-10-CM | POA: Insufficient documentation

## 2012-04-16 DIAGNOSIS — R61 Generalized hyperhidrosis: Secondary | ICD-10-CM | POA: Insufficient documentation

## 2012-04-16 DIAGNOSIS — R7989 Other specified abnormal findings of blood chemistry: Secondary | ICD-10-CM

## 2012-04-16 DIAGNOSIS — I679 Cerebrovascular disease, unspecified: Secondary | ICD-10-CM | POA: Insufficient documentation

## 2012-04-16 HISTORY — DX: Tobacco use: Z72.0

## 2012-04-16 NOTE — H&P (Signed)
Cardiologist: Dr. Diona Browner Corinda Gubler) PCP: Dr. Sherril Croon  Chief Complaint: sweats, dizzy, headache, elevated blood pressure, elevated troponin  HPI: The patient is a 61 year-old female with the above complaint.  She states that for the past 1 month she has had bouts with "colds" and has been given a Z-pack.  On further discussion, she admitted to not taking her HCTZ and to smoking 1/2 ppd again (when prompted by her grand-daughter).  She was seen by Dr. Diona Browner on 10/2 and was noted to be doing overall well except for an elevated blood pressure  She was sent to the ER by her PCP and found to have a normal ECG/CXR but elevated troponin (0.37) with normal CK/CKMB.  Her other labs were normal.  Her VS were significant for a BP of 179/80, otherwise WNL.  She was given ASA, nitro patch, and put on a heparin drip and sent here for further w/u.  She admits to using Nyquil/Advil 1 week ago; no OTC meds since Denies cocaine/amphetamines Admits to not taking HCTZ as prescribed as above and does not remember taking Atenolol  Denies chest pain, dyspnea, palpitations, edema, syncope, orthopnea, PND  C/o mild HA, low back pain c/w prior FMS pains  Past Medical History  Diagnosis Date  . Essential hypertension, benign   . GERD (gastroesophageal reflux disease)   . Depression   . Type 2 diabetes mellitus   . Anxiety   . Hiatal hernia   . NSTEMI (non-ST elevated myocardial infarction)     4/10  . Hyperlipidemia   . Carotid artery disease     Dr. Myra Gianotti, 60-79% RICA, s/p left CEA  . Fibromyalgia   . Coronary atherosclerosis of native coronary artery     DES RCA/circumflex 4/10, LVEF 65  . Diabetes mellitus April 2010    Past Surgical History  Procedure Date  . Carotid endarterectomy 02/28/2009  . Abdominal hysterectomy   . Tubal ligation   . Breast lumpectomy   . Cholecystectomy   . Lumbar disc surgery     L5-S1  . Total knee arthroplasty     Left  . Neck fusion 1996    C4 to C6  . Joint  replacement March 2013    Left knee  . Spine surgery 1994    Ruptured disc    Family History  Problem Relation Age of Onset  . Other Mother     Cerebral hemorrage  . Heart disease Mother   . Heart attack Mother   . Heart disease Father   . Heart attack Father   . Heart disease Brother     Heart Disease before age 68  . Diabetes Brother   . Hypertension Brother   . Heart attack Brother   . Cancer Sister   . Arthritis Sister   . Heart disease Sister   . Heart disease Daughter 34    Heart Disease before age 49  . Heart attack Daughter   . Cancer Sister   . Diabetes Brother     Varicose  Veins  . Peripheral vascular disease Brother   . Diabetes Brother    Social History:  reports that she quit smoking about 3 years ago but admitted to currently smoking 1/2 ppd when prompted by her grand-daughter.  She has a 45 pack-year smoking history. She reports that she does not drink alcohol or use illicit drugs. Lives alone.  No recent travel or immobilization  Allergies:  Allergies  Allergen Reactions  . Celecoxib  REACTION: itching  . Codeine     REACTION: itching  . Lamictal (Lamotrigine)   . Lyrica (Pregabalin)     REACTION: headaches,blurred vision  . Metoclopramide Hcl   . Morphine And Related   . Trazodone And Nefazodone     hallucinations  . Venlafaxine     Affected eyes  . Citalopram Palpitations    Heart rate increased  . Sertraline Hcl Other (See Comments)    Stomach upset    Medications Prior to Admission  Medication Sig Dispense Refill  . aspirin 325 MG EC tablet Take 325 mg by mouth daily.        Marland Kitchen atenolol (TENORMIN) 25 MG tablet DOES NOT TAKE THIS Take 1 tablet (25 mg total) by mouth daily.  30 tablet  6  . carisoprodol (SOMA) 350 MG tablet Take 350 mg by mouth at bedtime.        . clopidogrel (PLAVIX) 75 MG tablet Take 75 mg by mouth daily.        Marland Kitchen dexlansoprazole (DEXILANT) 60 MG capsule Take 60 mg by mouth daily.        . diazepam (VALIUM) 10  MG tablet Take 10 mg by mouth every 8 (eight) hours as needed.      . folic acid (FOLVITE) 1 MG tablet Take 1 mg by mouth daily.        . nitroGLYCERIN (NITROSTAT) 0.4 MG SL tablet Place 0.4 mg under the tongue every 5 (five) minutes as needed.      . Omega-3 Fatty Acids (FISH OIL) 1200 MG CAPS Take 1 capsule by mouth 3 (three) times daily.        Marland Kitchen oxyCODONE-acetaminophen (PERCOCET) 10-325 MG per tablet Take 1 tablet by mouth every 6 (six) hours as needed.        . potassium chloride (MICRO-K) 10 MEQ CR capsule Take 10 mEq by mouth as needed.       . simvastatin (ZOCOR) 40 MG tablet Take 60 mg by mouth every evening.      . triamterene-hydrochlorothiazide (MAXZIDE-25) 37.5-25 MG per tablet NOT TAKING CONSISTENTLY Take 0.5 tablets by mouth daily.        . Vitamin D, Ergocalciferol, (DRISDOL) 50000 UNITS CAPS Take 50,000 Units by mouth every 7 (seven) days.        She was prescribed Toprol unknown dose today  Review of Systems  All other systems reviewed and are negative.   Blood pressure 169/71, pulse 61, temperature 98.5 F (36.9 C), temperature source Oral, resp. rate 18, height 4\' 11"  (1.499 m), weight 114 lb (51.71 kg), SpO2 99.00%. (on room air)  Physical Exam   Constitutional: She appears well-developed. No distress.  Mouth/Throat: Oropharynx is clear and moist.  Neck supple. R>L carotid bruits Cardiovascular: Normal rate, regular rhythm and normal heart sounds.  very soft 1/6 systolic murmur. No S3/S4 B/l abdominal bruits, possibly renal in origin Respiratory: Effort normal. clear lungs. No distress. GI: Soft.  Musculoskeletal: She exhibits no edema.  Neurological: She is alert and oriented. MAE, nonfocal Skin: Skin is warm.  Psychiatric: She has a normal mood and affect.   Labs reviewed from [facility]: normal BMP, CBC, LFTs, Lipids panel; Troponin 0.37 (normal <0.03), normal CK/CK-MB  ECG (both [facility] and here): Sinus bradycardia, otherwise normal; no acute ischemic  changes; unchanged from prior  CXR: read as normal   Assessment/Plan 1) Episode of dizziness and sweats in setting of extremely elevated BP  possibly from BP elevation  Treat as below  Monitor on telemetry  Follow troponins  2) Hypertensive urgency/emergency  Possibly from noncompliance; also with possible renal artery stenosis by exam  Restart HCTZ, continue Toprol (just started, dose unknown; will do 12.5 mg daily given low HR)  Check renal artery duplex study to eval for RAS  May benefit from ACEI, but should r/o b/l RAS first  prn Hydralazine  3) Mild troponin elevation in setting of BP ~200 systolic, no chest pain, and normal ECGs  Suspect hypertensive urgency/emergency in someone with stable CAD; doubt ACS, PE  Trend troponins  Continue asa, plavix, toprol, zocor  Will not heparinize at this time as I am not convinced this is ACS and elevated BP increases  the risk of ICH while on UFH; will re-evaluate risk/benefit ratio if she develops more typical chest  pain or ECG changes  4) H/o CAD  Continue aspirin, plavix, zocor, toprol  5) H/o FMS  Continue home pain meds  6) DVT prophylaxis  Ambulation at this point  7) Full Code  8) Tobacco use  Counseled to quit, will prescribe nicotine patches  She will be admitted under Dr. Dietrich Pates.  Bayler Nehring 04/16/2012, 9:02 PM

## 2012-04-17 ENCOUNTER — Encounter (HOSPITAL_COMMUNITY): Payer: Self-pay | Admitting: *Deleted

## 2012-04-17 DIAGNOSIS — I2510 Atherosclerotic heart disease of native coronary artery without angina pectoris: Secondary | ICD-10-CM

## 2012-04-17 DIAGNOSIS — I10 Essential (primary) hypertension: Secondary | ICD-10-CM

## 2012-04-17 DIAGNOSIS — I6529 Occlusion and stenosis of unspecified carotid artery: Secondary | ICD-10-CM

## 2012-04-17 LAB — GLUCOSE, CAPILLARY: Glucose-Capillary: 105 mg/dL — ABNORMAL HIGH (ref 70–99)

## 2012-04-17 LAB — PROTIME-INR
INR: 1.03 (ref 0.00–1.49)
Prothrombin Time: 13.4 seconds (ref 11.6–15.2)

## 2012-04-17 MED ORDER — TRIAMTERENE-HCTZ 37.5-25 MG PO TABS
0.5000 | ORAL_TABLET | Freq: Every day | ORAL | Status: DC
  Administered 2012-04-17: 0.5 via ORAL
  Filled 2012-04-17: qty 0.5

## 2012-04-17 MED ORDER — OXYCODONE HCL 5 MG PO TABS: 5.0000 mg | ORAL_TABLET | Freq: Four times a day (QID) | ORAL | Status: DC | PRN

## 2012-04-17 MED ORDER — METOPROLOL SUCCINATE ER 25 MG PO TB24: 25.0000 mg | ORAL_TABLET | Freq: Every day | ORAL | Status: DC

## 2012-04-17 MED ORDER — HYDRALAZINE HCL 20 MG/ML IJ SOLN
10.0000 mg | INTRAMUSCULAR | Status: DC | PRN
  Filled 2012-04-17: qty 0.5

## 2012-04-17 MED ORDER — OXYCODONE-ACETAMINOPHEN 5-325 MG PO TABS
1.0000 | ORAL_TABLET | Freq: Four times a day (QID) | ORAL | Status: DC | PRN
  Administered 2012-04-17: 1 via ORAL
  Filled 2012-04-17: qty 1

## 2012-04-17 MED ORDER — DIAZEPAM 5 MG PO TABS: 10.0000 mg | ORAL_TABLET | Freq: Three times a day (TID) | ORAL | Status: DC | PRN

## 2012-04-17 MED ORDER — SIMVASTATIN 40 MG PO TABS
60.0000 mg | ORAL_TABLET | Freq: Every evening | ORAL | Status: DC
  Filled 2012-04-17: qty 1

## 2012-04-17 MED ORDER — ASPIRIN EC 325 MG PO TBEC
325.0000 mg | DELAYED_RELEASE_TABLET | Freq: Every day | ORAL | Status: DC
  Administered 2012-04-17: 325 mg via ORAL
  Filled 2012-04-17: qty 1

## 2012-04-17 MED ORDER — CLOPIDOGREL BISULFATE 75 MG PO TABS
75.0000 mg | ORAL_TABLET | Freq: Every day | ORAL | Status: DC
  Administered 2012-04-17: 75 mg via ORAL
  Filled 2012-04-17 (×2): qty 1

## 2012-04-17 MED ORDER — NICOTINE 7 MG/24HR TD PT24
7.0000 mg | MEDICATED_PATCH | Freq: Every day | TRANSDERMAL | Status: DC
  Administered 2012-04-17: 7 mg via TRANSDERMAL
  Filled 2012-04-17: qty 1

## 2012-04-17 MED ORDER — FOLIC ACID 1 MG PO TABS
1.0000 mg | ORAL_TABLET | Freq: Every day | ORAL | Status: DC
  Administered 2012-04-17: 1 mg via ORAL
  Filled 2012-04-17: qty 1

## 2012-04-17 MED ORDER — METOPROLOL SUCCINATE 12.5 MG HALF TABLET
12.5000 mg | ORAL_TABLET | Freq: Every day | ORAL | Status: DC
  Administered 2012-04-17: 12.5 mg via ORAL
  Filled 2012-04-17: qty 1

## 2012-04-17 MED ORDER — PANTOPRAZOLE SODIUM 40 MG PO TBEC
40.0000 mg | DELAYED_RELEASE_TABLET | Freq: Every day | ORAL | Status: DC
  Administered 2012-04-17: 40 mg via ORAL
  Filled 2012-04-17: qty 1

## 2012-04-17 MED ORDER — OXYCODONE-ACETAMINOPHEN 10-325 MG PO TABS: 1.0000 | ORAL_TABLET | Freq: Four times a day (QID) | ORAL | Status: DC | PRN

## 2012-04-17 MED ORDER — SODIUM CHLORIDE 0.9 % IJ SOLN
3.0000 mL | Freq: Two times a day (BID) | INTRAMUSCULAR | Status: DC
  Administered 2012-04-17 (×2): 3 mL via INTRAVENOUS

## 2012-04-17 MED ORDER — ZOLPIDEM TARTRATE 5 MG PO TABS: 5.0000 mg | ORAL_TABLET | Freq: Every evening | ORAL | Status: DC | PRN

## 2012-04-17 MED ORDER — INFLUENZA VIRUS VACC SPLIT PF IM SUSP: 0.5000 mL | INTRAMUSCULAR | Status: DC

## 2012-04-17 MED ORDER — CARISOPRODOL 350 MG PO TABS
350.0000 mg | ORAL_TABLET | Freq: Every day | ORAL | Status: DC
  Administered 2012-04-17: 350 mg via ORAL
  Filled 2012-04-17: qty 1

## 2012-04-17 NOTE — Discharge Summary (Signed)
Discharge Summary   Patient ID: KEAUNNA SKIPPER MRN: [id number], DOB/AGE: 16-Nov-1950 61 y.o.  Primary MD: Ignatius Specking., MD Primary Cardiologist: Dr. Diona Browner  Admit date: 04/16/2012 D/C date:     04/17/2012      Primary Discharge Diagnoses:  1. Hypertension  - Due to medication noncompliance   - Toprol and Maxzide resumed, Toprol increased to 25mg  daily  2. Diaphoresis/lightheaded  - Likely transient cerebral hypoperfusion, possibly related to hypertensive urgency  3. Elevated troponin  - Question lab error versus minimal release related to increased left ventricular wall strain  - Outpatient stress echocardiogram   4. Coronary artery disease  - 2 years out from a multiple stent procedure; Consideration can be given to discontinuation of Plavix by her principal cardiologist  5. Cerebrovascular disease  - Followed every 6 months by Vascular Surgery  6. Tobacco Abuse  - Currently still smoking 1/2ppd  7. Abdominal Bruit  - ? Renal in origin  - F/u w/ PCP for consideration of renal artery [place]  Secondary Discharge Diagnoses:  Marland Kitchen GERD (gastroesophageal reflux disease)   . Depression   . Type 2 diabetes mellitus   . Anxiety   . Hiatal hernia   . NSTEMI (non-ST elevated myocardial infarction)     4/10  . Hyperlipidemia   . Fibromyalgia   . Coronary atherosclerosis of native coronary artery     DES RCA/circumflex 4/10, LVEF 65    Allergies Allergies  Allergen Reactions  . Celecoxib     REACTION: itching  . Codeine     REACTION: itching  . Lamictal (Lamotrigine)   . Lyrica (Pregabalin)     REACTION: headaches,blurred vision  . Metoclopramide Hcl   . Morphine And Related   . Trazodone And Nefazodone     hallucinations  . Venlafaxine     Affected eyes  . Citalopram Palpitations    Heart rate increased  . Sertraline Hcl Other (See Comments)    Stomach upset    Diagnostic Studies/Procedures:  None  History of Present Illness: 61 y.o. female w/ the above  medical problems who presented to [facility] with complaints of sweats, dizziness, and headache. She was evaluated by her PCP with these complaints on day of presentation and found to have troponin 0.37 for which she was sent to the ED.  Hospital Course: In the ED, EKG revealed NSR with no acute ST/T changes. CXR was without acute cardiopulmonary abnormalities. Labs were significant for normal troponin. Her VS were significant for a BP of 179/80. She reported ongoing tobacco abuse and not taking BP meds at home. She was resumed on Maxzide and Toprol and admitted for further evaluation and treatment. She was noted to have bilateral abdominal bruits with question of renal origin for which renal artery [place] was ordered, but not completed at time of discharge. Will instruct her to follow up with her PCP. Cardiac enzymes were cycled and remained negative. There was question of lab error versus minimal release related to increased left ventricular wall strain. She had no recurrent symptoms with improved BP. It was felt her diaphoresis and lightheadedness were likely transient cerebral hypoperfusion, possibly related to hypertensive urgency. Will arrange for outpatient stress echo for further ischemic evaluation. She was seen and evaluated by Dr. Dietrich Pates who felt she was stable for discharge home with plans for follow up as scheduled below.  Discharge Vitals: Blood pressure 113/62, pulse 59, temperature 98.2 F (36.8 C), temperature source Oral, resp. rate 20, height  4\' 11"  (1.499 m), weight 114 lb (51.71 kg), SpO2 99.00%.  Labs:   [facility] 04/17/12 0525 04/17/12 0021  TROPONINI <0.30 <0.30     Discharge Medications     Medication List     As of 04/17/2012 12:05 PM    STOP taking these medications         atenolol 25 MG tablet   Commonly known as: TENORMIN      TAKE these medications         aspirin 325 MG EC tablet   Take 325 mg by mouth daily.      carisoprodol 350 MG  tablet   Commonly known as: SOMA   Take 350 mg by mouth at bedtime.      clopidogrel 75 MG tablet   Commonly known as: PLAVIX   Take 75 mg by mouth daily.      dexlansoprazole 60 MG capsule   Commonly known as: DEXILANT   Take 60 mg by mouth daily.      diazepam 10 MG tablet   Commonly known as: VALIUM   Take 10 mg by mouth every 8 (eight) hours as needed.      Fish Oil 1200 MG Caps   Take 1 capsule by mouth 3 (three) times daily.      folic acid 1 MG tablet   Commonly known as: FOLVITE   Take 1 mg by mouth daily.      metoprolol succinate 25 MG 24 hr tablet   Commonly known as: TOPROL-XL   Take 1 tablet (25 mg total) by mouth daily.      nitroGLYCERIN 0.4 MG SL tablet   Commonly known as: NITROSTAT   Place 0.4 mg under the tongue every 5 (five) minutes as needed.      oxyCODONE-acetaminophen 10-325 MG per tablet   Commonly known as: PERCOCET   Take 1 tablet by mouth every 6 (six) hours as needed.      potassium chloride 10 MEQ CR capsule   Commonly known as: MICRO-K   Take 10 mEq by mouth as needed.      simvastatin 40 MG tablet   Commonly known as: ZOCOR   Take 60 mg by mouth every evening.      triamterene-hydrochlorothiazide 37.5-25 MG per tablet   Commonly known as: MAXZIDE-25   Take 0.5 tablets by mouth daily.      Vitamin D (Ergocalciferol) 50000 UNITS Caps   Commonly known as: DRISDOL   Take 50,000 Units by mouth every 7 (seven) days.          Disposition   Discharge Orders    Future Appointments: Provider: Department: Dept Phone: Center:   06/07/2012 1:00 PM [facility] [phone] [facility]   06/07/2012 2:00 PM Evern Bio, NP [facility] [phone] [facility]     Future Orders Please Complete By Expires   Diet - low sodium heart healthy      Increase activity slowly      Discharge instructions      Comments:   * Please take all medications as prescribed and bring them with you to your office visits.  * Keep blood pressure  log and bring it with you to your follow up appointment.  * Please stop smoking!  * Please discuss with your primary care provider about having an ultrasound of your kidneys to check for narrowing of kidney arteries as a contributor to your high blood pressure.     Follow-up Information    Follow  up with [facility]. (Stress Test; Our office will call you with your appointment date/time)    Contact information:   [address] [place] [address] [phone]      Follow up with Nona Dell, MD. (Our office will call you with your appointment date/time)    Contact information:   [facility] [address] [place] [address] [phone]       Schedule an appointment as soon as possible for a visit with Ignatius Specking., MD.   Contact information:   [address] [place] [address] [phone]           Outstanding Labs/Studies:  1. Stress Echo 2. Renal Artery [place]  Duration of Discharge Encounter: Greater than 30 minutes including physician and PA time.  Signed, Nevia Henkin PA-C 04/17/2012, 12:05 PM

## 2012-04-17 NOTE — Progress Notes (Signed)
Pt discharged per MD order and protocol. All discharge instructions reviewed with patient and all questions answered. All Rx'es given. Pt aware of all follow up appointments.

## 2012-04-17 NOTE — Progress Notes (Signed)
[name]  61 y.o.  female  Subjective: No symptoms since admission. She was evaluated in her physician's office yesterday for routine visit, developed an episode of lightheadedness and diaphoresis, subsequently improved, call the office again to report a blood pressure of 220 systolic, was directed to the emergency department where troponin was reportedly elevated; however, troponins here have been normal.  Allergy: Celecoxib; Codeine; Lamictal; Lyrica; Metoclopramide hcl; Morphine and related; Trazodone and nefazodone; Venlafaxine; Citalopram; and Sertraline hcl  Objective: Vital signs in last 24 hours: Temp:  [98 F (36.7 C)-98.5 F (36.9 C)] 98.2 F (36.8 C) (10/05 0529) Pulse Rate:  [55-62] 55  (10/05 0529) Resp:  [18-20] 20  (10/05 0335) BP: (76-169)/(38-71) 104/61 mmHg (10/05 0529) SpO2:  [96 %-99 %] 97 % (10/05 0529) Weight:  [51.71 kg (114 lb)] 51.71 kg (114 lb) (10/04 2307)  51.71 kg (114 lb) Body mass index is 23.03 kg/(m^2).  Weight change:  Last BM Date: 04/16/12  Intake/Output from previous day: 10/04 0701 - 10/05 0700 In: 240 [P.O.:240] Out: 200 [Urine:200]  General- Well developed; no acute distress; proportionate weight and height Neck- No JVD, prominent bilateral carotid bruits Lungs- clear lung fields; normal I:E ratio Cardiovascular- normal PMI; split S1 and normal S2; modest basilar systolic ejection murmur Abdomen- normal bowel sounds; soft and non-tender without masses or organomegaly; no bruits Skin- Warm, no significant lesions Extremities- Nl distal pulses; no edema  Lab Results: Cardiac Markers:   Basename 04/17/12 0525 04/17/12 0021  TROPONINI <0.30 <0.30   EKG:  Sinus bradycardia; left atrial abnormality; otherwise normal.  Imaging Studies/Results: Cardiac cath 10/2008, revealing severe two-vessel CAD, EF-65%; Proximal 25% LAD. Cx ostial 30% stenosis and mid 80% stenosis.  Posterolateral ostial 40%; RI large with luminal irregularities. RCA  large and  dominant. Ostial calcified 90% stenosis.  Mid 75% stenosis of PDA,  large vessel, with mid occlusion. Distal PDA filled via left-to-  right collaterals. Posterolateral small, successful PCI with  placement of drug-eluting stents in the mid circumflex artery, mid  RCA, and ostium of the RCA.  Imaging: Imaging results have been reviewed  Medications:  I have reviewed the patient's current medications. Scheduled:   . aspirin  325 mg Oral Daily  . carisoprodol  350 mg Oral QHS  . clopidogrel  75 mg Oral Q breakfast  . folic acid  1 mg Oral Daily  . influenza  inactive virus vaccine  0.5 mL Intramuscular Tomorrow-1000  . metoprolol succinate  12.5 mg Oral Daily  . nicotine  7 mg Transdermal Daily  . pantoprazole  40 mg Oral Q1200  . simvastatin  60 mg Oral QPM  . sodium chloride  3 mL Intravenous Q12H  . triamterene-hydrochlorothiazide  0.5 each Oral Daily   Assessment/Plan: Hypertension: Patient admits to noncompliance with her antihypertensive medications because "blood pressure was so good". She understands that control will only be maintained as long she takes her medication as directed. She can be discharged on current medication with an increase in her dose of Toprol to 25 mg per day. Elevated troponin: Question lab error versus minimal release related to increased left ventricular wall strain. Outpatient stress echocardiogram will be appropriate to reassess prognosis with respect to known coronary disease. Coronary artery disease: Patient is 2 years out from a multiple stent procedure; consideration can be given to discontinuation of Plavix by her principal cardiologist, Dr. Diona Browner. Cerebrovascular disease: Followed every 6 months by Vascular Surgery. Diaphoresis/lightheaded: Likely transient cerebral hypoperfusion, possibly related to hypertensive urgency.  Orthostatic vital signs will be checked prior to discharge.   LOS: 1 day   [place] Bing, M.D. 04/17/2012,  9:52 AM Physician time-20 minutes

## 2012-04-17 NOTE — Progress Notes (Signed)
Patient called c/o " I am wet all  over." Went to check on patient. She was diaphoric , skin warm, resp. Even and unlab. No complaints of pain or sob. Check v.s. BP left arm  Down 76/38 R-20,T-98,P-62  S.R.. accu ck 105 BP- right arm 87/46. R.N. Aware and into see patient. Patient had ntg paste on rt. Chest d.c. ngt paste. . Clean patient and bed and pt. Used bedpan and vded approx. . Patient starting to feel better recheck BP left arm 111/60 right arm 103/61. Dr. Orvis Brill paged and made aware of the above o.k. To take ntg past off. No other orders given.

## 2012-04-19 NOTE — Progress Notes (Signed)
Retro  Ur review.

## 2012-04-21 ENCOUNTER — Encounter: Payer: Self-pay | Admitting: *Deleted

## 2012-04-21 ENCOUNTER — Other Ambulatory Visit: Payer: Self-pay | Admitting: Cardiology

## 2012-04-21 DIAGNOSIS — I2510 Atherosclerotic heart disease of native coronary artery without angina pectoris: Secondary | ICD-10-CM

## 2012-04-21 DIAGNOSIS — R0602 Shortness of breath: Secondary | ICD-10-CM

## 2012-04-22 ENCOUNTER — Other Ambulatory Visit: Payer: Medicaid Other

## 2012-04-23 ENCOUNTER — Ambulatory Visit (HOSPITAL_COMMUNITY): Payer: Medicaid Other | Attending: Cardiovascular Disease

## 2012-04-23 ENCOUNTER — Encounter: Payer: Self-pay | Admitting: Cardiology

## 2012-04-23 DIAGNOSIS — R0989 Other specified symptoms and signs involving the circulatory and respiratory systems: Secondary | ICD-10-CM | POA: Insufficient documentation

## 2012-04-23 DIAGNOSIS — I2510 Atherosclerotic heart disease of native coronary artery without angina pectoris: Secondary | ICD-10-CM | POA: Insufficient documentation

## 2012-04-23 DIAGNOSIS — I252 Old myocardial infarction: Secondary | ICD-10-CM | POA: Insufficient documentation

## 2012-04-23 DIAGNOSIS — R0602 Shortness of breath: Secondary | ICD-10-CM

## 2012-04-23 DIAGNOSIS — R0609 Other forms of dyspnea: Secondary | ICD-10-CM | POA: Insufficient documentation

## 2012-04-23 NOTE — Progress Notes (Signed)
Echocardiogram performed.  

## 2012-04-26 ENCOUNTER — Encounter: Payer: Self-pay | Admitting: *Deleted

## 2012-05-07 ENCOUNTER — Encounter: Payer: Self-pay | Admitting: Physician Assistant

## 2012-05-07 ENCOUNTER — Ambulatory Visit (INDEPENDENT_AMBULATORY_CARE_PROVIDER_SITE_OTHER): Payer: Medicaid Other | Admitting: Physician Assistant

## 2012-05-07 VITALS — BP 116/68 | HR 64 | Ht 59.0 in | Wt 117.8 lb

## 2012-05-07 DIAGNOSIS — I739 Peripheral vascular disease, unspecified: Secondary | ICD-10-CM | POA: Insufficient documentation

## 2012-05-07 DIAGNOSIS — I2510 Atherosclerotic heart disease of native coronary artery without angina pectoris: Secondary | ICD-10-CM

## 2012-05-07 DIAGNOSIS — I701 Atherosclerosis of renal artery: Secondary | ICD-10-CM

## 2012-05-07 DIAGNOSIS — I10 Essential (primary) hypertension: Secondary | ICD-10-CM

## 2012-05-07 MED ORDER — ASPIRIN EC 81 MG PO TBEC: 81.0000 mg | DELAYED_RELEASE_TABLET | Freq: Every day | ORAL | Status: DC

## 2012-05-07 NOTE — Assessment & Plan Note (Signed)
No further workup indicated. Status post recent hospitalization with normal cardiac markers, and subsequent outpatient negative stress echocardiogram. Moreover, patient denies any recent development of CP. Continue current medication regimen, which includes Plavix, but with down titration of ASA to 81 mg daily.

## 2012-05-07 NOTE — Patient Instructions (Signed)
   Decrease Aspirin to 81mg  daily  Referral to [facility] V[facility] - Dr. Myra Gianotti Continue all current medications. Your physician wants you to follow up in: 6 months.  You will receive a reminder letter in the mail one-two months in advance.  If you don't receive a letter, please call our office to schedule the follow up appointment

## 2012-05-07 NOTE — Progress Notes (Signed)
Primary Cardiologist: Heather Huh, MD   HPI: Post hospital followup from [facility], status post presentation with uncontrolled HTN and outpatient troponin level 0.37. Subsequent serial troponins were negative. Recommendation was to proceed further with outpatient evaluation with a stress echocardiogram. This was completed October 11, and was negative for ischemia.  Clinically, patient denies any chest pain. In fact, she states that she did not have any during her recent presentation. Her main problem was that of uncontrolled hypertension, with reported systolics of greater than 200.   Additional post hospital workup consisted of a recommended MRI/MRA scan of the abdomen, in followup of noted abdominal bruits on exam. This study was completed on October 21, ordered by Dr. 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  Clinically, patient denies any symptoms suggestive of frank intermittent claudication. Of note, however, she has resumed smoking tobacco.   12-lead EKG today, reviewed by me, indicates NSR 64 bpm; no ischemic changes  Allergies  Allergen Reactions  . Celecoxib     REACTION: itching  . Codeine     REACTION: itching  . Lamictal (Lamotrigine)   . Lyrica (Pregabalin)     REACTION: headaches,blurred vision  . Metoclopramide Hcl     Unknown  . Morphine And Related     Unknown  . Trazodone And Nefazodone     hallucinations  . Venlafaxine     Affected eyes  . Citalopram Palpitations    Heart rate increased  . Sertraline Hcl Other (See Comments)    Stomach upset    Current Outpatient Prescriptions  Medication Sig Dispense Refill  . carisoprodol (SOMA) 350 MG tablet Take 350 mg by mouth at bedtime.        . clopidogrel (PLAVIX) 75 MG tablet Take 75 mg by mouth daily.      Marland Kitchen dexlansoprazole (DEXILANT) 60 MG capsule Take 60 mg by mouth daily.      . diazepam (VALIUM) 10 MG tablet Take 10 mg by mouth every 8 (eight) hours as needed.        . folic acid (FOLVITE) 1 MG tablet Take 1 mg by mouth daily.        . metoprolol succinate (TOPROL-XL) 25 MG 24 hr tablet Take 1 tablet (25 mg total) by mouth daily.  30 tablet  6  . nitroGLYCERIN (NITROSTAT) 0.4 MG SL tablet Place 0.4 mg under the tongue every 5 (five) minutes as needed. For chest pain      . Omega-3 Fatty Acids (FISH OIL) 1200 MG CAPS Take 1 capsule by mouth 3 (three) times daily.        Marland Kitchen oxyCODONE-acetaminophen (PERCOCET) 10-325 MG per tablet Take 1 tablet by mouth every 6 (six) hours as needed.      . potassium chloride (K-DUR) 10 MEQ tablet Take 10 mEq by mouth daily.      . simvastatin (ZOCOR) 40 MG tablet Take 40 mg by mouth every evening.      . triamterene-hydrochlorothiazide (MAXZIDE-25) 37.5-25 MG per tablet Take 0.5 tablets by mouth daily.      . Vitamin D, Ergocalciferol, (DRISDOL) 50000 UNITS CAPS Take 50,000 Units by mouth every 7 (seven) days.        Past Medical History  Diagnosis Date  . Essential hypertension, benign   . GERD (gastroesophageal reflux disease)   . Depression   . Type 2 diabetes mellitus   . Anxiety   . Hiatal hernia   . NSTEMI (non-ST elevated  myocardial infarction)     4/10  . Hyperlipidemia   . Carotid artery disease     Dr. Myra Gianotti, 60-79% RICA, s/p left CEA  . Fibromyalgia   . Coronary atherosclerosis of native coronary artery     DES RCA/circumflex 4/10, LVEF 65  . Tobacco abuse   . Abdominal bruit     ? Renal in origin    Past Surgical History  Procedure Date  . Carotid endarterectomy 02/28/2009  . Abdominal hysterectomy   . Tubal ligation   . Breast lumpectomy   . Cholecystectomy   . Lumbar disc surgery     L5-S1  . Total knee arthroplasty     Left  . Neck fusion 1996    C4 to C6  . Joint replacement March 2013    Left knee  . Spine surgery 1994    Ruptured disc    History   Social History  . Marital Status: Divorced    Spouse Name: N/A    Number of Children: N/A  . Years of Education: N/A    Occupational History  . Not on file.   Social History Main Topics  . Smoking status: Former Smoker -- 1.0 packs/day for 45 years    Types: Cigarettes    Quit date: 10/12/2008  . Smokeless tobacco: Never Used  . Alcohol Use: No  . Drug Use: No  . Sexually Active: Not on file   Other Topics Concern  . Not on file   Social History Narrative  . No narrative on file    Family History  Problem Relation Age of Onset  . Other Mother     Cerebral hemorrage  . Heart disease Mother   . Heart attack Mother   . Heart disease Father   . Heart attack Father   . Heart disease Brother     Heart Disease before age 68  . Diabetes Brother   . Hypertension Brother   . Heart attack Brother   . Cancer Sister   . Arthritis Sister   . Heart disease Sister   . Heart disease Daughter 28    Heart Disease before age 1  . Heart attack Daughter   . Cancer Sister   . Diabetes Brother     Varicose  Veins  . Peripheral vascular disease Brother   . Diabetes Brother     ROS: no nausea, vomiting; no fever, chills; no melena, hematochezia; no claudication  PHYSICAL EXAM: BP 116/68  Pulse 64  Ht 4\' 11"  (1.499 m)  Wt 117 lb 12.8 oz (53.434 kg)  BMI 23.79 kg/m2 GENERAL: 61 year old female; NAD HEENT: NCAT, PERRLA, EOMI; sclera clear; no xanthelasma NECK: palpable bilateral carotid pulses, with bilateral bruits (R > L); left neck linear scar; no JVD LUNGS: CTA bilaterally CARDIAC: RRR (S1, S2); no significant murmurs; no rubs or gallops ABDOMEN: Diffuse, soft bilateral upper quadrant bruits EXTREMETIES: Palpable bilateral femoral pulses with soft, bilateral bruits; no significant peripheral edema; diminished PPs SKIN: warm/dry; no obvious rash/lesions MUSCULOSKELETAL: no joint deformity NEURO: no focal deficit; NL affect   EKG: reviewed and available in Electronic Records   ASSESSMENT & PLAN:  CAD, NATIVE VESSEL No further workup indicated. Status post recent hospitalization with  normal cardiac markers, and subsequent outpatient negative stress echocardiogram. Moreover, patient denies any recent development of CP. Continue current medication regimen, which includes Plavix, but with down titration of ASA to 81 mg daily.  PAD (peripheral artery disease) Patient will be referred to Dr. Myra Gianotti of  [facility], where she is followed regularly for known carotid artery disease, for further recommendations regarding recent abnormal MRI/MRI scan suggestive of bilateral RAS and left CIA disease. Patient also strongly advised to discontinue smoking tobacco.  Essential hypertension, benign Well-controlled on current medication regimen    Gene Avalee Castrellon, PAC

## 2012-05-07 NOTE — Assessment & Plan Note (Signed)
Patient will be referred to Dr. Myra Gianotti of [facility], where she is followed regularly for known carotid artery disease, for further recommendations regarding recent abnormal MRI/MRI scan suggestive of bilateral RAS and left CIA disease. Patient also strongly advised to discontinue smoking tobacco.

## 2012-05-07 NOTE — Assessment & Plan Note (Signed)
Well-controlled on current medication regimen 

## 2012-05-10 ENCOUNTER — Other Ambulatory Visit: Payer: Self-pay

## 2012-05-10 DIAGNOSIS — I739 Peripheral vascular disease, unspecified: Secondary | ICD-10-CM

## 2012-05-28 ENCOUNTER — Encounter: Payer: Self-pay | Admitting: Surgery

## 2012-05-31 ENCOUNTER — Ambulatory Visit (INDEPENDENT_AMBULATORY_CARE_PROVIDER_SITE_OTHER): Payer: Medicaid Other | Admitting: Surgery

## 2012-05-31 ENCOUNTER — Other Ambulatory Visit: Payer: Medicaid Other

## 2012-05-31 ENCOUNTER — Encounter: Payer: Self-pay | Admitting: Surgery

## 2012-05-31 VITALS — BP 152/49 | HR 67 | Resp 16 | Ht 59.0 in | Wt 117.0 lb

## 2012-05-31 DIAGNOSIS — I6529 Occlusion and stenosis of unspecified carotid artery: Secondary | ICD-10-CM

## 2012-05-31 DIAGNOSIS — Z48812 Encounter for surgical aftercare following surgery on the circulatory system: Secondary | ICD-10-CM

## 2012-05-31 NOTE — Progress Notes (Signed)
[facility] [place] HISTORY AND PHYSICAL   CC:  RAS Vyas, Dhruv B., MD  HPI: This is a 61 y.o. female here for increased and uncontrolled HTN.  She states that she was at Dr. Vyas's office on 05/17/12 and stated she felt like she was going to pass out.  When they took her BP, it was 210/109.  She was subsequently sent for a stress test, which was negative and troponins were also negative.  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    She has had a left CEA in the past.  She does have right carotid artery stenosis 60-79% in May 2013, but has been asymptomatic and denies amaurosis fugax, paraesthesias or numbness of her limbs.    She has had trouble with balance recently.  She denies claudication symptoms.  She states that her legs ache and this improves with a hot shower. She is medically managed for type 2 diabetes and hyperlipidemia.  She is referred to Dr. Brabham for possible interaction.  Past Medical History  Diagnosis Date  . Essential hypertension, benign   . GERD (gastroesophageal reflux disease)   . Depression   . Type 2 diabetes mellitus   . Anxiety   . Hiatal hernia   . NSTEMI (non-ST elevated myocardial infarction)     4/10  . Hyperlipidemia   . Carotid artery disease     Dr. Brabham, 60-79% RICA, s/p left CEA  . Fibromyalgia   . Coronary atherosclerosis of native coronary artery     DES RCA/circumflex 4/10, LVEF 65  . Tobacco abuse   . Abdominal bruit     ? Renal in origin  . Arthritis     Rhumatoid and Osteoarthritis  . Cancer   . Peripheral arterial disease   . Fibromyalgia 1994  . Restless leg syndrome   . Spondylosis without myelopathy    Past Surgical History  Procedure Date  . Carotid endarterectomy 02/28/2009  . Tubal ligation   . Breast lumpectomy   . Lumbar disc surgery     L5-S1  . Total knee arthroplasty     Left  . Neck fusion 1996    C4 to C6  . Spine surgery 1994    Ruptured disc  . Joint replacement  March 2013    Left knee  . Joint replacement 2006    Left Knee  . Tonsilectomy, adenoidectomy, bilateral myringotomy and tubes 1969  . Abdominal hysterectomy 1977  . Cholecystectomy 1991  . Colonoscopy   . Eye surgery     Catartact bilateral    Allergies  Allergen Reactions  . Celecoxib     REACTION: itching  . Codeine     REACTION: itching  . Lamictal (Lamotrigine)   . Lyrica (Pregabalin)     REACTION: headaches,blurred vision  . Metoclopramide Hcl     Unknown  . Morphine And Related     Unknown  . Trazodone And Nefazodone     hallucinations  . Venlafaxine     Affected eyes  . Citalopram Palpitations    Heart rate increased  . Sertraline Hcl Other (See Comments)    Stomach upset    Current Outpatient Prescriptions  Medication Sig Dispense Refill  . aspirin EC 81 MG tablet Take 1 tablet (81 mg total) by mouth daily.      . carisoprodol (SOMA) 350 MG tablet Take 350 mg by mouth at bedtime.        . clopidogrel (  PLAVIX) 75 MG tablet Take 75 mg by mouth daily.      . dexlansoprazole (DEXILANT) 60 MG capsule Take 60 mg by mouth daily.      . diazepam (VALIUM) 10 MG tablet Take 10 mg by mouth every 8 (eight) hours as needed.      . folic acid (FOLVITE) 1 MG tablet Take 1 mg by mouth daily.        . metoprolol succinate (TOPROL-XL) 25 MG 24 hr tablet Take 1 tablet (25 mg total) by mouth daily.  30 tablet  6  . nitroGLYCERIN (NITROSTAT) 0.4 MG SL tablet Place 0.4 mg under the tongue every 5 (five) minutes as needed. For chest pain      . Omega-3 Fatty Acids (FISH OIL) 1200 MG CAPS Take 1 capsule by mouth 3 (three) times daily.        . oxyCODONE-acetaminophen (PERCOCET) 10-325 MG per tablet Take 1 tablet by mouth every 6 (six) hours as needed.      . potassium chloride (K-DUR) 10 MEQ tablet Take 10 mEq by mouth daily.      . simvastatin (ZOCOR) 40 MG tablet Take 40 mg by mouth every evening.      . triamterene-hydrochlorothiazide (MAXZIDE-25) 37.5-25 MG per tablet Take 0.5  tablets by mouth daily.      . Vitamin D, Ergocalciferol, (DRISDOL) 50000 UNITS CAPS Take 50,000 Units by mouth every 7 (seven) days.        Family History  Problem Relation Age of Onset  . Other Mother     Cerebral hemorrage  . Heart disease Mother   . Heart attack Mother   . Heart disease Father   . Heart attack Father   . Heart disease Brother     Heart Disease before age 60  . Diabetes Brother   . Hypertension Brother   . Heart attack Brother   . Cancer Sister   . Arthritis Sister   . Heart disease Sister   . Heart disease Daughter 44    Heart Disease before age 60  . Heart attack Daughter   . Cancer Sister   . Arthritis Sister   . Diabetes Brother     Varicose  Veins  . Peripheral vascular disease Brother   . Diabetes Brother     History   Social History  . Marital Status: Divorced    Spouse Name: N/A    Number of Children: N/A  . Years of Education: N/A   Occupational History  . Not on file.   Social History Main Topics  . Smoking status: Former Smoker -- 1.0 packs/day for 45 years    Types: Cigarettes    Quit date: 10/12/2008  . Smokeless tobacco: Never Used  . Alcohol Use: No  . Drug Use: No  . Sexually Active: Not on file   Other Topics Concern  . Not on file   Social History Narrative  . No narrative on file     ROS: [x] Positive   [ ] Negative   [ ] All sytems reviewed and are negative  Cardiac: []chest pain; [x]chest pressure; []palpitations; []SOB lying flat; [x]DOE; Vascular:  [x]pain in legs with walking-hot showers make it better; []pain in legs when lying flat; []Hx of DVT; []Hx phlebitis; [x]swelling in legs; []varicose veins Pulmonary: []productive cough; []asthma; []wheezing Neurologic:  []Hx CVA;  []weakness in arms or legs; []numbness in arms or legs; []difficulty in speaking or slurred speech; []temporary loss of vision in one eye; [  x]dizziness Hematologic:  []bleeding problems; []clots easily GI:  []vomiting blood; [] blood in  stool; []PUD; [x] constipation/IBS GU: [] Dysuria; []hematuria; []ESRD Psychiatric:  []Hx major depression Integumentary:  []rashes; []ulcers Constitutional:  []fever; []chills    PHYSICAL EXAMINATION:  Filed Vitals:   05/31/12 1500  BP: 152/49  Pulse: 67  Resp:    Body mass index is 23.63 kg/(m^2).  General:  WDWN in NAD Gait: Normal HENT: WNL; well healed left CEA scar. Eyes: PERRL Pulmonary: normal non-labored breathing , without Rales, rhonchi,  wheezing Cardiac: RRR, without  Murmurs, rubs or gallops; right carotid bruit. No carotid bruit. Abdomen: soft, NT, no masses Skin: no rashes, ulcers noted Vascular Exam/Pulses: + palpable DP and radial pulses bilaterally.  1+ edema BLE Extremities without ischemic changes, no Gangrene , no cellulitis; no open wounds;  Musculoskeletal: no muscle wasting or atrophy  Neurologic: A&O X 3; Appropriate Affect ; SENSATION: normal; MOTOR FUNCTION:  moving all extremities equally. Speech is fluent/normal  Non-Invasive Vascular Imaging:   MRI: 1. Bilateral proximal RAS of possible hemodynamic significance with left worse than right.   2. Proximal left CIA stenosis.  Correlate with clinical symptomatology and ABI's.  ASSESSMENT: 61 y.o. female referred for bilateral RAS.   PLAN:   -We will plan to do an arteriogram with possible stent placement on June 15, 2012. -she will continue her Plavix/Aspirin -She is asymptomatic from her right carotid stenosis and we will get a carotid duplex in 6 months as she was 60-79% back in May 2013. -pt does not have claudication symptoms and has palpable pedal pulses bilaterally.   Keina Mutch, PA-C [facility] [phone]  Clinic MD Seen and examined with Dr. Brabham  I agree with the above. The patient was seen and examined. She has been denied by Medicaid for a carotid ultrasound today. Her last ultrasound showed a 60-79% stenosis, at the high end of the range with  end-diastolic velocities around 100 cm/s. She remained asymptomatic, and therefore I will have her ultrasound repeated in 6 months. She recently had a hypertensive crisis and has had difficult to control blood pressure. An MR AC congested high-grade bilateral renal artery stenosis. I have recommended proceeding with catheter angiography to determine the true significance of her stenosis, and to proceed with renal artery stenting as indicated. This has been scheduled for December 3. She will continue her aspirin and Plavix.  Wells Brabham   

## 2012-06-01 ENCOUNTER — Encounter (HOSPITAL_COMMUNITY): Payer: Self-pay | Admitting: Pharmacy Technician

## 2012-06-02 NOTE — Addendum Note (Signed)
Addended by: Sharee Pimple on: 06/02/2012 09:49 AM   Modules accepted: Orders

## 2012-06-07 ENCOUNTER — Other Ambulatory Visit: Payer: Medicaid Other

## 2012-06-07 ENCOUNTER — Other Ambulatory Visit: Payer: Self-pay

## 2012-06-07 ENCOUNTER — Ambulatory Visit: Payer: Medicaid Other | Admitting: Neurosurgery

## 2012-06-15 ENCOUNTER — Ambulatory Visit (HOSPITAL_COMMUNITY)
Admission: RE | Admit: 2012-06-15 | Discharge: 2012-06-15 | Disposition: A | Discharge status: Home / Self Care | Payer: Medicaid Other | Source: Ambulatory Visit | Attending: Surgery | Admitting: Surgery

## 2012-06-15 ENCOUNTER — Encounter (HOSPITAL_COMMUNITY)
Admission: RE | Disposition: A | Discharge status: Home / Self Care | Payer: Self-pay | Source: Ambulatory Visit | Attending: Surgery

## 2012-06-15 ENCOUNTER — Telehealth: Payer: Self-pay | Admitting: Surgery

## 2012-06-15 DIAGNOSIS — E119 Type 2 diabetes mellitus without complications: Secondary | ICD-10-CM | POA: Insufficient documentation

## 2012-06-15 DIAGNOSIS — I701 Atherosclerosis of renal artery: Secondary | ICD-10-CM | POA: Insufficient documentation

## 2012-06-15 DIAGNOSIS — I10 Essential (primary) hypertension: Secondary | ICD-10-CM | POA: Insufficient documentation

## 2012-06-15 HISTORY — PX: RENAL ANGIOGRAM: SHX5509

## 2012-06-15 HISTORY — PX: PERCUTANEOUS STENT INTERVENTION: SHX5500

## 2012-06-15 LAB — POCT I-STAT, CHEM 8
Calcium, Ion: 1.1 mmol/L — ABNORMAL LOW (ref 1.13–1.30)
Chloride: 102 mEq/L (ref 96–112)
Glucose, Bld: 99 mg/dL (ref 70–99)
HCT: 36 % (ref 36.0–46.0)
Hemoglobin: 12.2 g/dL (ref 12.0–15.0)
Potassium: 3.1 mEq/L — ABNORMAL LOW (ref 3.5–5.1)

## 2012-06-15 LAB — POCT ACTIVATED CLOTTING TIME: Activated Clotting Time: 239 seconds

## 2012-06-15 SURGERY — RENAL ANGIOGRAM
Anesthesia: LOCAL

## 2012-06-15 MED ORDER — ONDANSETRON HCL 4 MG/2ML IJ SOLN: 4.0000 mg | Freq: Four times a day (QID) | INTRAMUSCULAR | Status: DC | PRN

## 2012-06-15 MED ORDER — HEPARIN SODIUM (PORCINE) 1000 UNIT/ML IJ SOLN
INTRAMUSCULAR | Status: AC
  Filled 2012-06-15: qty 1

## 2012-06-15 MED ORDER — MIDAZOLAM HCL 2 MG/2ML IJ SOLN
INTRAMUSCULAR | Status: AC
  Filled 2012-06-15: qty 2

## 2012-06-15 MED ORDER — GUAIFENESIN-DM 100-10 MG/5ML PO SYRP: 15.0000 mL | ORAL_SOLUTION | ORAL | Status: DC | PRN

## 2012-06-15 MED ORDER — HEPARIN (PORCINE) IN NACL 2-0.9 UNIT/ML-% IJ SOLN
INTRAMUSCULAR | Status: AC
  Filled 2012-06-15: qty 1000

## 2012-06-15 MED ORDER — ALUM & MAG HYDROXIDE-SIMETH 200-200-20 MG/5ML PO SUSP: 15.0000 mL | ORAL | Status: DC | PRN

## 2012-06-15 MED ORDER — ACETAMINOPHEN 325 MG RE SUPP: 325.0000 mg | RECTAL | Status: DC | PRN

## 2012-06-15 MED ORDER — CLOPIDOGREL BISULFATE 75 MG PO TABS: 75.0000 mg | ORAL_TABLET | Freq: Every day | ORAL | Status: DC

## 2012-06-15 MED ORDER — ACETAMINOPHEN 325 MG PO TABS: 325.0000 mg | ORAL_TABLET | ORAL | Status: DC | PRN

## 2012-06-15 MED ORDER — SODIUM CHLORIDE 0.9 % IV SOLN
INTRAVENOUS | Status: DC
  Administered 2012-06-15: 1000 mL via INTRAVENOUS

## 2012-06-15 MED ORDER — METOPROLOL TARTRATE 1 MG/ML IV SOLN: 2.0000 mg | INTRAVENOUS | Status: DC | PRN

## 2012-06-15 MED ORDER — OXYCODONE HCL 5 MG PO TABS
ORAL_TABLET | ORAL | Status: AC
  Administered 2012-06-15: 5 mg via ORAL
  Filled 2012-06-15: qty 1

## 2012-06-15 MED ORDER — FENTANYL CITRATE 0.05 MG/ML IJ SOLN
INTRAMUSCULAR | Status: AC
  Filled 2012-06-15: qty 2

## 2012-06-15 MED ORDER — LABETALOL HCL 5 MG/ML IV SOLN: 10.0000 mg | INTRAVENOUS | Status: DC | PRN

## 2012-06-15 MED ORDER — OXYCODONE HCL 5 MG PO TABS
5.0000 mg | ORAL_TABLET | ORAL | Status: DC | PRN
  Administered 2012-06-15: 5 mg via ORAL

## 2012-06-15 MED ORDER — LIDOCAINE HCL (PF) 1 % IJ SOLN
INTRAMUSCULAR | Status: AC
  Filled 2012-06-15: qty 30

## 2012-06-15 MED ORDER — PHENOL 1.4 % MT LIQD: 1.0000 | OROMUCOSAL | Status: DC | PRN

## 2012-06-15 MED ORDER — MORPHINE SULFATE 10 MG/ML IJ SOLN: 2.0000 mg | INTRAMUSCULAR | Status: DC | PRN

## 2012-06-15 MED ORDER — SODIUM CHLORIDE 0.9 % IV SOLN: INTRAVENOUS | Status: DC

## 2012-06-15 MED ORDER — HYDRALAZINE HCL 20 MG/ML IJ SOLN: 10.0000 mg | INTRAMUSCULAR | Status: DC | PRN

## 2012-06-15 NOTE — Interval H&P Note (Signed)
History and Physical Interval Note:  06/15/2012 9:14 AM  [name]  has presented today for surgery, with the diagnosis of Renal artery stenosis  The various methods of treatment have been discussed with the patient and family. After consideration of risks, benefits and other options for treatment, the patient has consented to  Procedure(s) (LRB) with comments: RENAL ANGIOGRAM (N/A) as a surgical intervention .  The patient's history has been reviewed, patient examined, no change in status, stable for surgery.  I have reviewed the patient's chart and labs.  Questions were answered to the patient's satisfaction.     BRABHAM IV, V. WELLS

## 2012-06-15 NOTE — Op Note (Signed)
[facility]  Patient name: [name] MRN: [id number] DOB: [birth year] Sex: female  06/15/2012 Pre-operative Diagnosis: Renal artery stenosis Post-operative diagnosis:  Same Surgeon:  Jorge Ny Procedure Performed:  1.  ultrasound access right femoral artery  2.  abdominal aortogram  3.   First order catheterization (left renal artery)  4.  left renal artery angiogram  5.  left renal artery stent     Indications:  The patient has uncontrolled hypertension on multiple medications. She was recently admitted to the hospital for hypertensive crisis. MRA revealed bilateral renal artery stenosis, left greater than right. She comes in today for further evaluation and possible intervention.  Procedure:  The patient was identified in the holding area and taken to room 8.  The patient was then placed supine on the table and prepped and draped in the usual sterile fashion.  A time out was called.  Ultrasound was used to evaluate the right common femoral artery.  It was patent .  A digital ultrasound image was acquired.  A micropuncture needle was used to access the right common femoral artery under ultrasound guidance.  An 018 wire was advanced without resistance and a micropuncture sheath was placed.  The 018 wire was removed and a benson wire was placed.  The micropuncture sheath was exchanged for a 6 French sheath. Over a Benson wire an omni-flush catheter was advanced to the level of L1 and an abdominal aortogram was performed. Next using a JR 4 catheter the left renal artery was selected and the left renal angiogram was performed. Findings:   Aortogram:  The visualized portions of the suprarenal abdominal aorta showed no significant disease. Minimal right renal artery stenosis is identified.  Greater than 70% left renal artery stenosis is identified. The infrarenal abdominal aorta is widely patent without significant stenosis. Bilateral common and external  iliac arteries are widely patent.  Left renal artery:  Approximately 70% stenosis is identified within the midportion of the left renal artery  Intervention:  After the above images were obtained, the decision was made to proceed with intervention. The patient was fully heparinized A 014 stabilizer wire was advanced out into the distal branches of the left renal artery. A 5 x 15 express stent was selected. This was placed across the stenosis and the balloon was inflated to 10 atmospheres. Followup arteriogram revealed resolution of the left renal artery stenosis. The catheters and wires were removed. The patient will be taken the holding area for sheath pull once her coagulation profile corrects.  Impression:  #1  successful left renal artery stenting for a greater than 70% stenosis, using a 5 x 15 express stent  #2  no significant right renal artery stenosis     V. Durene Cal, M.D. [facility] [place] Office: [phone] Pager:  [phone]

## 2012-06-15 NOTE — Telephone Encounter (Signed)
Sent letter regarding 6 month f.u, dpm

## 2012-06-15 NOTE — Telephone Encounter (Signed)
Message copied by Fredrich Birks on Tue Jun 15, 2012  3:38 PM ------      Message from: Melene Plan      Created: Tue Jun 15, 2012 11:22 AM                   ----- Message -----         From: Nada Libman, MD         Sent: 06/15/2012  10:40 AM           To: Reuel Derby, Melene Plan, RN            06/15/2012:             1.  ultrasound access right femoral artery       2.  abdominal aortogram       3.   First order catheterization (left renal artery)       4.  left renal artery angiogram       5.  left renal artery stent                   Please schedule the patient to come back and see me in 6 months with a renal artery ultrasound.

## 2012-06-15 NOTE — H&P (View-Only) (Signed)
[facility] HISTORY AND PHYSICAL   CC:  RAS Vyas, Dhruv B., MD  HPI: This is a 61 y.o. female here for increased and uncontrolled HTN.  She states that she was at Dr. Bonnita Levan office on 05/17/12 and stated she felt like she was going to pass out.  When they took her BP, it was 210/109.  She was subsequently sent for a stress test, which was negative and troponins were also negative.  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    She has had a left CEA in the past.  She does have right carotid artery stenosis 60-79% in May 2013, but has been asymptomatic and denies amaurosis fugax, paraesthesias or numbness of her limbs.    She has had trouble with balance recently.  She denies claudication symptoms.  She states that her legs ache and this improves with a hot shower. She is medically managed for type 2 diabetes and hyperlipidemia.  She is referred to Dr. Myra Gianotti for possible interaction.  Past Medical History  Diagnosis Date  . Essential hypertension, benign   . GERD (gastroesophageal reflux disease)   . Depression   . Type 2 diabetes mellitus   . Anxiety   . Hiatal hernia   . NSTEMI (non-ST elevated myocardial infarction)     4/10  . Hyperlipidemia   . Carotid artery disease     Dr. Myra Gianotti, 60-79% RICA, s/p left CEA  . Fibromyalgia   . Coronary atherosclerosis of native coronary artery     DES RCA/circumflex 4/10, LVEF 65  . Tobacco abuse   . Abdominal bruit     ? Renal in origin  . Arthritis     Rhumatoid and Osteoarthritis  . Cancer   . Peripheral arterial disease   . Fibromyalgia 1994  . Restless leg syndrome   . Spondylosis without myelopathy    Past Surgical History  Procedure Date  . Carotid endarterectomy 02/28/2009  . Tubal ligation   . Breast lumpectomy   . Lumbar disc surgery     L5-S1  . Total knee arthroplasty     Left  . Neck fusion 1996    C4 to C6  . Spine surgery 1994    Ruptured disc  . Joint replacement  March 2013    Left knee  . Joint replacement 2006    Left Knee  . Tonsilectomy, adenoidectomy, bilateral myringotomy and tubes 1969  . Abdominal hysterectomy 1977  . Cholecystectomy 1991  . Colonoscopy   . Eye surgery     Catartact bilateral    Allergies  Allergen Reactions  . Celecoxib     REACTION: itching  . Codeine     REACTION: itching  . Lamictal (Lamotrigine)   . Lyrica (Pregabalin)     REACTION: headaches,blurred vision  . Metoclopramide Hcl     Unknown  . Morphine And Related     Unknown  . Trazodone And Nefazodone     hallucinations  . Venlafaxine     Affected eyes  . Citalopram Palpitations    Heart rate increased  . Sertraline Hcl Other (See Comments)    Stomach upset    Current Outpatient Prescriptions  Medication Sig Dispense Refill  . aspirin EC 81 MG tablet Take 1 tablet (81 mg total) by mouth daily.      . carisoprodol (SOMA) 350 MG tablet Take 350 mg by mouth at bedtime.        . clopidogrel (  PLAVIX) 75 MG tablet Take 75 mg by mouth daily.      Marland Kitchen dexlansoprazole (DEXILANT) 60 MG capsule Take 60 mg by mouth daily.      . diazepam (VALIUM) 10 MG tablet Take 10 mg by mouth every 8 (eight) hours as needed.      . folic acid (FOLVITE) 1 MG tablet Take 1 mg by mouth daily.        . metoprolol succinate (TOPROL-XL) 25 MG 24 hr tablet Take 1 tablet (25 mg total) by mouth daily.  30 tablet  6  . nitroGLYCERIN (NITROSTAT) 0.4 MG SL tablet Place 0.4 mg under the tongue every 5 (five) minutes as needed. For chest pain      . Omega-3 Fatty Acids (FISH OIL) 1200 MG CAPS Take 1 capsule by mouth 3 (three) times daily.        Marland Kitchen oxyCODONE-acetaminophen (PERCOCET) 10-325 MG per tablet Take 1 tablet by mouth every 6 (six) hours as needed.      . potassium chloride (K-DUR) 10 MEQ tablet Take 10 mEq by mouth daily.      . simvastatin (ZOCOR) 40 MG tablet Take 40 mg by mouth every evening.      . triamterene-hydrochlorothiazide (MAXZIDE-25) 37.5-25 MG per tablet Take 0.5  tablets by mouth daily.      . Vitamin D, Ergocalciferol, (DRISDOL) 50000 UNITS CAPS Take 50,000 Units by mouth every 7 (seven) days.        Family History  Problem Relation Age of Onset  . Other Mother     Cerebral hemorrage  . Heart disease Mother   . Heart attack Mother   . Heart disease Father   . Heart attack Father   . Heart disease Brother     Heart Disease before age 63  . Diabetes Brother   . Hypertension Brother   . Heart attack Brother   . Cancer Sister   . Arthritis Sister   . Heart disease Sister   . Heart disease Daughter 20    Heart Disease before age 73  . Heart attack Daughter   . Cancer Sister   . Arthritis Sister   . Diabetes Brother     Varicose  Veins  . Peripheral vascular disease Brother   . Diabetes Brother     History   Social History  . Marital Status: Divorced    Spouse Name: N/A    Number of Children: N/A  . Years of Education: N/A   Occupational History  . Not on file.   Social History Main Topics  . Smoking status: Former Smoker -- 1.0 packs/day for 45 years    Types: Cigarettes    Quit date: 10/12/2008  . Smokeless tobacco: Never Used  . Alcohol Use: No  . Drug Use: No  . Sexually Active: Not on file   Other Topics Concern  . Not on file   Social History Narrative  . No narrative on file     ROS: [x]  Positive   [ ]  Negative   [ ]  All sytems reviewed and are negative  Cardiac: [] chest pain; [x] chest pressure; [] palpitations; [] SOB lying flat; [x] DOE; Vascular:  [x] pain in legs with walking-hot showers make it better; [] pain in legs when lying flat; [] Hx of DVT; [] Hx phlebitis; [x] swelling in legs; [] varicose veins Pulmonary: [] productive cough; [] asthma; [] wheezing Neurologic:  [] Hx CVA;  [] weakness in arms or legs; [] numbness in arms or legs; [] difficulty in speaking or slurred speech; [] temporary loss of vision in one eye; [  x]dizziness Hematologic:  [] bleeding problems; [] clots easily GI:  [] vomiting blood; []  blood in  stool; [] PUD; [x]  constipation/IBS GU: []  Dysuria; [] hematuria; [] ESRD Psychiatric:  [] Hx major depression Integumentary:  [] rashes; [] ulcers Constitutional:  [] fever; [] chills    PHYSICAL EXAMINATION:  Filed Vitals:   05/31/12 1500  BP: 152/49  Pulse: 67  Resp:    Body mass index is 23.63 kg/(m^2).  General:  WDWN in NAD Gait: Normal HENT: WNL; well healed left CEA scar. Eyes: PERRL Pulmonary: normal non-labored breathing , without Rales, rhonchi,  wheezing Cardiac: RRR, without  Murmurs, rubs or gallops; right carotid bruit. No carotid bruit. Abdomen: soft, NT, no masses Skin: no rashes, ulcers noted Vascular Exam/Pulses: + palpable DP and radial pulses bilaterally.  1+ edema BLE Extremities without ischemic changes, no Gangrene , no cellulitis; no open wounds;  Musculoskeletal: no muscle wasting or atrophy  Neurologic: A&O X 3; Appropriate Affect ; SENSATION: normal; MOTOR FUNCTION:  moving all extremities equally. Speech is fluent/normal  Non-Invasive Vascular Imaging:   MRI: 1. Bilateral proximal RAS of possible hemodynamic significance with left worse than right.   2. Proximal left CIA stenosis.  Correlate with clinical symptomatology and ABI's.  ASSESSMENT: 61 y.o. female referred for bilateral RAS.   PLAN:   -We will plan to do an arteriogram with possible stent placement on June 15, 2012. -she will continue her Plavix/Aspirin -She is asymptomatic from her right carotid stenosis and we will get a carotid duplex in 6 months as she was 60-79% back in May 2013. -pt does not have claudication symptoms and has palpable pedal pulses bilaterally.   Doreatha Massed, PA-C [facility] [phone]  Clinic MD Seen and examined with Dr. Myra Gianotti  I agree with the above. The patient was seen and examined. She has been denied by [facility] for a carotid ultrasound today. Her last ultrasound showed a 60-79% stenosis, at the high end of the range with  end-diastolic velocities around 100 cm/s. She remained asymptomatic, and therefore I will have her ultrasound repeated in 6 months. She recently had a hypertensive crisis and has had difficult to control blood pressure. An MR AC congested high-grade bilateral renal artery stenosis. I have recommended proceeding with catheter angiography to determine the true significance of her stenosis, and to proceed with renal artery stenting as indicated. This has been scheduled for December 3. She will continue her aspirin and Plavix.  Durene Cal

## 2012-06-16 ENCOUNTER — Other Ambulatory Visit: Payer: Self-pay | Admitting: *Deleted

## 2012-06-16 DIAGNOSIS — Z48812 Encounter for surgical aftercare following surgery on the circulatory system: Secondary | ICD-10-CM

## 2012-06-16 DIAGNOSIS — I701 Atherosclerosis of renal artery: Secondary | ICD-10-CM

## 2012-10-31 ENCOUNTER — Encounter: Payer: Self-pay | Admitting: Cardiology

## 2012-11-04 ENCOUNTER — Encounter: Payer: Self-pay | Admitting: Cardiology

## 2012-11-04 ENCOUNTER — Ambulatory Visit (INDEPENDENT_AMBULATORY_CARE_PROVIDER_SITE_OTHER): Payer: Medicaid Other | Admitting: Cardiology

## 2012-11-04 VITALS — BP 169/59 | HR 58 | Ht 59.0 in | Wt 115.1 lb

## 2012-11-04 DIAGNOSIS — E785 Hyperlipidemia, unspecified: Secondary | ICD-10-CM

## 2012-11-04 DIAGNOSIS — I10 Essential (primary) hypertension: Secondary | ICD-10-CM

## 2012-11-04 DIAGNOSIS — I2510 Atherosclerotic heart disease of native coronary artery without angina pectoris: Secondary | ICD-10-CM

## 2012-11-04 DIAGNOSIS — I701 Atherosclerosis of renal artery: Secondary | ICD-10-CM | POA: Insufficient documentation

## 2012-11-04 DIAGNOSIS — I6529 Occlusion and stenosis of unspecified carotid artery: Secondary | ICD-10-CM

## 2012-11-04 NOTE — Assessment & Plan Note (Signed)
Followed by Dr Brabham 

## 2012-11-04 NOTE — Assessment & Plan Note (Signed)
Elevated today in the setting of hip pain. She reports compliance with her medications, is also status post left renal artery stent placement in December 2013 by Dr. Myra Gianotti.

## 2012-11-04 NOTE — Progress Notes (Signed)
Clinical Summary [name] is a 62 y.o.female last seen in October 2013. She reports no progressive angina symptoms. States she has been compliant with her medications.  Stress echocardiogram done in October 2013 was negative for ischemia.  Record review finds left renal artery stenting by Dr. Myra Gianotti in December 2013. She also tells me that she has had significant left hip pain, had a recent CT done per Dr. 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   Allergies  Allergen Reactions  . Celecoxib     REACTION: itching  . Codeine     REACTION: itching  . Lamictal (Lamotrigine)   . Lyrica (Pregabalin)     REACTION: headaches,blurred vision  . Metoclopramide Hcl     Unknown  . Morphine And Related     Unknown  . Trazodone And Nefazodone     hallucinations  . Venlafaxine     Affected eyes  . Citalopram Palpitations    Heart rate increased  . Sertraline Hcl Other (See Comments)    Stomach upset    Current Outpatient Prescriptions  Medication Sig Dispense Refill  . aspirin EC 81 MG tablet Take 1 tablet (81 mg total) by mouth daily.      . carisoprodol (SOMA) 350 MG tablet Take 350 mg by mouth at bedtime.        . clopidogrel (PLAVIX) 75 MG tablet Take 75 mg by mouth daily.      . diazepam (VALIUM) 10 MG tablet Take 10 mg by mouth every 8 (eight) hours as needed. For anxiety      . folic acid (FOLVITE) 1 MG tablet Take 1 mg by mouth daily.        . metoprolol succinate (TOPROL-XL) 25 MG 24 hr tablet Take 1 tablet (25 mg total) by mouth daily.  30 tablet  6  . nitroGLYCERIN (NITROSTAT) 0.4 MG SL tablet Place 0.4 mg under the tongue every 5 (five) minutes as needed. For chest pain      . Omega-3 Fatty Acids (FISH OIL) 1200 MG CAPS Take 1 capsule by mouth 3 (three) times daily.        Marland Kitchen oxyCODONE-acetaminophen (PERCOCET) 10-325 MG per tablet Take 1 tablet by mouth every 6 (six) hours as needed. For pain      .  potassium chloride (K-DUR) 10 MEQ tablet Take 10 mEq by mouth daily as needed. Use only when Potassium low. (Legs start cramping)      . simvastatin (ZOCOR) 40 MG tablet Take 20 mg by mouth every evening.       . triamterene-hydrochlorothiazide (MAXZIDE-25) 37.5-25 MG per tablet Take 0.5 tablets by mouth daily.      . Vitamin D, Ergocalciferol, (DRISDOL) 50000 UNITS CAPS Take 50,000 Units by mouth every 7 (seven) days. Take on mondays       No current facility-administered medications for this visit.    Past Medical History  Diagnosis Date  . Essential hypertension, benign   . GERD (gastroesophageal reflux disease)   . Depression   . Type 2 diabetes mellitus   . Anxiety   . Hiatal hernia   . NSTEMI (non-ST elevated myocardial infarction)     4/10  . Hyperlipidemia   . Carotid artery disease     Dr. Myra Gianotti, 60-79% RICA, s/p left CEA  . Fibromyalgia   . Coronary atherosclerosis of native coronary artery     DES RCA/circumflex 4/10, LVEF 65  . Arthritis  Rhumatoid and Osteoarthritis  . Cancer   . Renal artery stenosis     Left renal artery stent 12/13  . Restless leg syndrome   . Spondylosis without myelopathy     Social History Ms. Smither reports that she quit smoking about 4 years ago. Her smoking use included Cigarettes. She has a 45 pack-year smoking history. She has never used smokeless tobacco. Ms. Holstein reports that she does not drink alcohol.  Review of Systems No palpitations or syncope. Stable appetite. No reported bleeding problems. Otherwise negative.  Physical Examination Filed Vitals:   11/04/12 1310  BP: 169/59  Pulse: 58   Filed Weights   11/04/12 1310  Weight: 115 lb 1.9 oz (52.218 kg)   NAD. HEENT: Conjunctiva and lids normal, oropharynx clear.  Neck: Supple, no elevated JVP, bilateral carotid bruits noted, right greater than left. Well-healed CEA scar on left.  Lungs: Clear to auscultation, diminished, nonlabored.  Cardiac: Regular rate and  rhythm, no S3 or rub. Soft systolic murmur and S4.  Extremities: No pitting edema.  Skin: Warm and dry.  Musculoskeletal: No kyphosis.  Neuropsychiatric: Alert and oriented x3, affect appropriate.   Problem List and Plan   CAD, NATIVE VESSEL Symptomatically stable medical therapy. She had a negative stress echocardiogram in October 2013. Plan is to continue medical therapy and observation.  CAROTID OCCLUSIVE DISEASE Followed by Dr. Myra Gianotti.  Essential hypertension, benign Elevated today in the setting of hip pain. She reports compliance with her medications, is also status post left renal artery stent placement in December 2013 by Dr. Myra Gianotti.  DYSLIPIDEMIA Continues on statin therapy, followed by Dr. Sherril Croon.    Jonelle Sidle, M.D., F.A.C.C.

## 2012-11-04 NOTE — Assessment & Plan Note (Signed)
Continues on statin therapy, followed by Dr. Sherril Croon.

## 2012-11-04 NOTE — Patient Instructions (Signed)
Continue all current medications. Your physician wants you to follow up in: 6 months.  You will receive a reminder letter in the mail one-two months in advance.  If you don't receive a letter, please call our office to schedule the follow up appointment   

## 2012-11-04 NOTE — Assessment & Plan Note (Signed)
Symptomatically stable medical therapy. She had a negative stress echocardiogram in October 2013. Plan is to continue medical therapy and observation.

## 2012-11-29 ENCOUNTER — Ambulatory Visit: Payer: Medicaid Other | Admitting: Neurosurgery

## 2012-11-29 ENCOUNTER — Other Ambulatory Visit: Payer: Medicaid Other

## 2012-12-17 ENCOUNTER — Other Ambulatory Visit (HOSPITAL_COMMUNITY): Payer: Self-pay | Admitting: Cardiology

## 2012-12-24 ENCOUNTER — Encounter: Payer: Self-pay | Admitting: Surgery

## 2012-12-27 ENCOUNTER — Encounter: Payer: Self-pay | Admitting: Surgery

## 2012-12-27 ENCOUNTER — Ambulatory Visit: Payer: Medicaid Other | Admitting: Surgery

## 2012-12-27 ENCOUNTER — Other Ambulatory Visit (INDEPENDENT_AMBULATORY_CARE_PROVIDER_SITE_OTHER): Payer: Medicaid Other

## 2012-12-27 ENCOUNTER — Other Ambulatory Visit: Payer: Medicaid Other

## 2012-12-27 ENCOUNTER — Ambulatory Visit (INDEPENDENT_AMBULATORY_CARE_PROVIDER_SITE_OTHER): Payer: Medicaid Other | Admitting: Surgery

## 2012-12-27 VITALS — BP 186/47 | HR 65 | Ht 59.0 in | Wt 119.0 lb

## 2012-12-27 DIAGNOSIS — Z48812 Encounter for surgical aftercare following surgery on the circulatory system: Secondary | ICD-10-CM

## 2012-12-27 DIAGNOSIS — I701 Atherosclerosis of renal artery: Secondary | ICD-10-CM

## 2012-12-27 DIAGNOSIS — I6529 Occlusion and stenosis of unspecified carotid artery: Secondary | ICD-10-CM

## 2012-12-27 NOTE — Addendum Note (Signed)
Addended by: Adria Dill L on: 12/27/2012 11:14 AM   Modules accepted: Orders

## 2012-12-27 NOTE — Progress Notes (Signed)
[facility]   Patient name: [name] MRN: [id number] DOB: 1951/02/13 Sex: female     Chief Complaint  Patient presents with  . Re-evaluation    6 month renal stent, carotid  - all labs denied     HISTORY OF PRESENT ILLNESS: The patient is back today for followup of her carotid occlusive disease as well as her renal artery stenosis. Her most recent carotid ultrasound was over one year ago. This showed a high-grade right carotid artery stenosis approaching 80%. Her left endarterectomy site was widely patent. She has undergone renal artery stenting in December of 2013. She had a stent placed in her left renal artery for a high-grade stenosis. She states that since that time she has had an improvement in her blood pressure.  Today the patient implanted headaches and dizziness. She states that she has not had anything to be ordering over the past 12 hours in anticipation of abdominal ultrasound imaging today. She also complains of pain in her hip and knee.  Past Medical History  Diagnosis Date  . Essential hypertension, benign   . GERD (gastroesophageal reflux disease)   . Depression   . Type 2 diabetes mellitus   . Anxiety   . Hiatal hernia   . NSTEMI (non-ST elevated myocardial infarction)     4/10  . Hyperlipidemia   . Carotid artery disease     Dr. Myra Gianotti, 60-79% RICA, s/p left CEA  . Fibromyalgia   . Coronary atherosclerosis of native coronary artery     DES RCA/circumflex 4/10, LVEF 65  . Arthritis     Rhumatoid and Osteoarthritis  . Cancer   . Renal artery stenosis     Left renal artery stent 12/13  . Restless leg syndrome   . Spondylosis without myelopathy     Past Surgical History  Procedure Laterality Date  . Carotid endarterectomy  02/28/2009  . Tubal ligation    . Breast lumpectomy    . Lumbar disc surgery      L5-S1  . Total knee arthroplasty      Left  . Neck fusion  1996    C4 to C6  . Spine surgery  1994    Ruptured  disc  . Joint replacement  March 2013    Left knee  . Joint replacement  2006    Left Knee  . Tonsilectomy, adenoidectomy, bilateral myringotomy and tubes  1969  . Abdominal hysterectomy  1977  . Cholecystectomy  1991  . Colonoscopy    . Eye surgery      Catartact bilateral    History   Social History  . Marital Status: Divorced    Spouse Name: N/A    Number of Children: N/A  . Years of Education: N/A   Occupational History  . Not on file.   Social History Main Topics  . Smoking status: Former Smoker -- 1.00 packs/day for 45 years    Types: Cigarettes    Quit date: 10/12/2008  . Smokeless tobacco: Never Used  . Alcohol Use: No  . Drug Use: No  . Sexually Active: Not on file   Other Topics Concern  . Not on file   Social History Narrative  . No narrative on file    Family History  Problem Relation Age of Onset  . Other Mother     Cerebral hemorrage  . Heart disease Mother   . Heart attack Mother   . Heart disease Father   .  Heart attack Father   . Heart disease Brother     Heart Disease before age 56  . Diabetes Brother   . Hypertension Brother   . Heart attack Brother   . Cancer Sister   . Arthritis Sister   . Heart disease Sister   . Heart disease Daughter 29    Heart Disease before age 38  . Heart attack Daughter   . Cancer Sister   . Arthritis Sister   . Diabetes Brother     Varicose  Veins  . Peripheral vascular disease Brother   . Diabetes Brother     Allergies as of 12/27/2012 - Review Complete 12/27/2012  Allergen Reaction Noted  . Celecoxib    . Codeine    . Lamictal (lamotrigine)  03/28/2011  . Lyrica (pregabalin)    . Metoclopramide hcl  03/28/2011  . Morphine and related  03/28/2011  . Trazodone and nefazodone  04/14/2012  . Venlafaxine  04/14/2012  . Citalopram Palpitations 05/21/2011  . Sertraline hcl Other (See Comments) 10/16/2011    Current Outpatient Prescriptions on File Prior to Visit  Medication Sig Dispense Refill   . aspirin EC 81 MG tablet Take 1 tablet (81 mg total) by mouth daily.      . carisoprodol (SOMA) 350 MG tablet Take 350 mg by mouth at bedtime.        . clopidogrel (PLAVIX) 75 MG tablet Take 75 mg by mouth daily.      . diazepam (VALIUM) 10 MG tablet Take 10 mg by mouth every 8 (eight) hours as needed. For anxiety      . folic acid (FOLVITE) 1 MG tablet Take 1 mg by mouth daily.        . metoprolol succinate (TOPROL-XL) 25 MG 24 hr tablet TAKE ONE TABLET ONCE DAILY  30 tablet  5  . Omega-3 Fatty Acids (FISH OIL) 1200 MG CAPS Take 1 capsule by mouth 3 (three) times daily.        Marland Kitchen oxyCODONE-acetaminophen (PERCOCET) 10-325 MG per tablet Take 1 tablet by mouth every 6 (six) hours as needed. For pain      . potassium chloride (K-DUR) 10 MEQ tablet Take 10 mEq by mouth daily as needed. Use only when Potassium low. (Legs start cramping)      . simvastatin (ZOCOR) 40 MG tablet Take 20 mg by mouth every evening.       . triamterene-hydrochlorothiazide (MAXZIDE-25) 37.5-25 MG per tablet Take 0.5 tablets by mouth daily.      . Vitamin D, Ergocalciferol, (DRISDOL) 50000 UNITS CAPS Take 50,000 Units by mouth every 7 (seven) days. Take on mondays      . nitroGLYCERIN (NITROSTAT) 0.4 MG SL tablet Place 0.4 mg under the tongue every 5 (five) minutes as needed. For chest pain       No current facility-administered medications on file prior to visit.     REVIEW OF SYSTEMS: Please see history of present illness, otherwise all systems negative  PHYSICAL EXAMINATION:   Vital signs are BP 186/47  Pulse 65  Ht 4\' 11"  (1.499 m)  Wt 119 lb (53.978 kg)  BMI 24.02 kg/m2  SpO2 98% General: The patient appears their stated age. HEENT:  No gross abnormalities Pulmonary:  Non labored breathing Abdomen: Soft and non-tender. Abdominal bruit in the epigastrium Musculoskeletal: There are no major deformities. Neurologic: No focal weakness or paresthesias are detected, Skin: There are no ulcer or rashes  noted. Psychiatric: The patient has normal affect. Cardiovascular: There  is a regular rate and rhythm without significant murmur appreciated. Bilateral carotid bruits   Diagnostic Studies None today. Her carotid and abdominal ultrasound were not approved  Assessment: Carotid artery occlusive disease Renal artery stenosis Plan: Carotid artery occlusive disease. I believe that the patient is asymptomatic, however she does have dizziness today which is used to not associated with stenosis and just 1 carotid artery. Her most recent ultrasound was one year ago. This showed a progressively increasing right carotid stenosis, approaching 80%. She continues to have a right carotid bruit. I feel that she needs to have this imaged with ultrasound to see whether or not she has a stenosis greater than 80%. If she does, I recommend right carotid endarterectomy. I will work on trying to get Medicaid approval for study.  Renal artery stenosis-the patient's blood pressure does appear to have improved after stenting. I would also like to have this imaged with ultrasound to make sure that she does not develop in stent stenosis. Ultimately get an ultrasound of her renal arteries in 6 months.  She will continue with a statin for her hypercholesterolemia. In addition she'll continue with dual age antiplatelet therapy, aspirin and Plavix.  Jorge Ny, M.D. [facility] [place] Office: [phone] Pager:  [phone]

## 2013-01-05 ENCOUNTER — Encounter: Payer: Self-pay | Admitting: Cardiology

## 2013-01-05 ENCOUNTER — Other Ambulatory Visit: Payer: Self-pay | Admitting: *Deleted

## 2013-01-05 NOTE — Telephone Encounter (Signed)
Noted incoming fax to Dr Dionicia Abler to advise the pt medication refill for metoprolol succinate has been declined per justification for the use of a non-preferred agent, Dr Dionicia Abler made notations to see if the pt can be changed to Toprol xl or lopressor per pt insurance, called pt pharmacy and spoke to rep Jillyn Hidden, advised the Toprol XL would be covered and the change has already been made for the pt to Toprol XL 25mg  once daily, pt has already picked up the [employer]

## 2013-01-11 ENCOUNTER — Other Ambulatory Visit (INDEPENDENT_AMBULATORY_CARE_PROVIDER_SITE_OTHER): Payer: Medicaid Other | Admitting: *Deleted

## 2013-01-11 DIAGNOSIS — I701 Atherosclerosis of renal artery: Secondary | ICD-10-CM

## 2013-01-11 DIAGNOSIS — Z48812 Encounter for surgical aftercare following surgery on the circulatory system: Secondary | ICD-10-CM

## 2013-01-11 DIAGNOSIS — I6529 Occlusion and stenosis of unspecified carotid artery: Secondary | ICD-10-CM

## 2013-01-12 ENCOUNTER — Other Ambulatory Visit: Payer: Self-pay | Admitting: Internal Medicine

## 2013-01-18 ENCOUNTER — Ambulatory Visit
Admission: RE | Admit: 2013-01-18 | Discharge: 2013-01-18 | Disposition: A | Discharge status: Home / Self Care | Payer: Medicaid Other | Source: Ambulatory Visit | Attending: Internal Medicine | Admitting: Internal Medicine

## 2013-01-18 ENCOUNTER — Other Ambulatory Visit (HOSPITAL_COMMUNITY): Payer: Self-pay | Admitting: Interventional Radiology

## 2013-01-18 DIAGNOSIS — K559 Vascular disorder of intestine, unspecified: Secondary | ICD-10-CM

## 2013-01-18 NOTE — Progress Notes (Signed)
Patient ID: [name], female   DOB: [birth year], 62 y.o.   MRN: [id number]  January 18, 2013  Doreen Beam, MD [address] [place], [place] [address]  Re: [name]. Dauphine (DOB: 08/19/2050)  Dear Dr. Sherril Croon:  Thank you for referring Ms. [name] for consultation regarding possible treatment of stenosis of the superior mesenteric artery. The patient is a 62 year old female with a primary complaint of abdominal pain and bloating. Abdominal pain has worsened over the last four months and is described as diffuse lower abdominal pain which does not always correlate with meals. She also feels that her abdomen is more bloated than usual. 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 The celiac axis and inferior mesenteric artery did not show significant disease.  The patient does have a significant cardiovascular history with prior myocardial infarction and percutaneous coronary intervention in April 2010 by Dr. Verne Carrow. Drug-eluting stents were placed at that time in the mid circumflex coronary artery, mid right coronary artery and the proximal right coronary artery. The patient is followed by Dr. Simona Huh of [facility] in [place], [place]. She is status post left carotid endarterectomy for symptomatic left carotid stenosis on 02/28/09 by Dr. Durene Cal. Dr. Myra Gianotti also performed a left renal artery stenting procedure to treat significant renal artery stenosis on 06/15/12. This was performed for significant hypertension. Probable renal artery stenosis was uncovered at that time prior to intervention by an MRA of the abdomen on 05/03/12.  Past Medical History: 1. Cardiac history, as above. The patient is on chronic antiplatelet therapy. 2. Cerebrovascular disease, as above, and status post left carotid endarterectomy. 3. Renal artery stenosis, as above,  and status post left renal artery stent placement. 4. History of hypertension. 5. History of prior colonoscopy x2 in 2011 by Dr. Lionel December. Polyps were removed at that time, which did not show evidence of malignancy. The patient is scheduled to follow-up with Dr. Karilyn Cota soon for further recommendation regarding future colonoscopy. 6. Prior cervical spine surgery in 2005 by Dr. Jeral Fruit consisting of C5 corpectomy, C6-7 diskectomy and cervical fusion. 7. Hysterectomy in 1977, cholecystectomy in 1991, and history of fibromyalgia.  Page 2 Re: [name] (DOB: 08/19/2050)  Medications: Aspirin 81 mg daily, Plavix 75 mg daily, Soma 350 mg at bedtime, Valium 5 mg two tablets as needed, folic acid 1 mg daily, Lopressor 20 mg daily, nitroglycerin 0.4 mg sublingual as needed, Percocet 10/325 one as needed up to every six hours, potassium 10 mEq daily as needed, simvastatin 20 mg daily, Maxzide 37.5/25 mg one tablet daily.  Allergies: Celebrex, codeine, Lyrica, Reglan, morphine, trazodone, nefazodone, venlafaxine, citalopram, Lamictal, Savella, sertraline HCl.  Social History: The patient is divorced and had three children. One is deceased. [place] in [place]. She is not employed. She smoked up to about 2010 for all of her adult life then quit for a short period of time before restarting smoking. She has not smoked since 2012.  Family History: Brother with asthma. Multiple family members with diabetes. Daughter with epilepsy. Multiple family members with high blood pressure. Mother with history of stroke. Brother with thyroid disorder.  Exam: Vital signs: Blood pressure 187/99, pulse 65, respirations 14, temperature 98.1. oxygen saturation 97% on room air. General: No acute distress. Chest: Clear to auscultation bilaterally. Heart: Regular rate and rhythm. II/VI systolic ejection murmur. Abdomen: Soft. Mildly tender to palpation.  No significant distention or  palpable masses.  Review of Systems: General: 4'11" in height, 120 pounds in weight. The patient wears glasses. Gastrointestinal: Abdominal pain and bloating. Genitourinary: No hematuria or dysuria. Chest: No chest pain or palpitations. Musculoskeletal: Degenerative disk disease of the cervical spine and lumbar spine with associated discomfort. No difficulty ambulating or performing activities of daily living. Neurologic: Occasional light headedness and dizziness.  Labs: BUN 12, creatinine 0.84, estimated GFR greater than 60 ml/minute on 01/06/13.  Imaging: I personally reviewed a CT of the abdomen and pelvis on 01/05/13 and MRA of the abdomen on 05/03/12. These were also reviewed directly with the patient and her granddaughter. The CT was performed during venous phase of imaging and is not optimal for arterial evaluation. There does appear to be some predominantly noncalcified plaque in the proximal trunk of the SMA which could potentially be causing significant stenosis. However, the MRA study was less impressive last year and showed probable mild stenosis of the proximal SMA trunk. The celiac axis and IMA do not appear to have significant disease on either study.  Assessment: I met with Ms. Bayless and her granddaughter. We reviewed her history and imaging findings. There may certainly be a hemodynamically significant stenosis of the SMA based on the recent CT study. However, the CT was not optimal for arterial evaluation and not performed as a CTA. MRA findings were not as impressive last year. Also, the patient does not have a classic pattern of intestinal angina with meals. She does have a history that is significant for cardiovascular disease, however, and due to symptoms of worsening abdominal pain, I have recommended that we obtain a CTA of the abdomen to determine if there is a significant underlying SMA stenosis. This could then be further treated in the future, if  necessary, with percutaneous stenting if there is progression of symptoms. Recent renal function is normal and the patient can receive iodinated contrast material. Page 3  Re: [name] (DOB: 2050-08-05)  While we schedule a CTA examination, I will also attempt to review the angiogram performed in the Peripheral Vascular Lab by Dr. Myra Gianotti last December at the time of renal artery stenting. This may or may not show evidence of SMA stenosis.  Thank you again for referring Ms. Cotugno for consultation and allowing me to participate in her care. I will update you with CTA findings and meet back with the patient after the CTA is performed.  Sincerely,  Irish Lack, MD Tessie EkeDurene Cal, MD Simona Huh, MD Lionel December, MD

## 2013-01-20 ENCOUNTER — Encounter: Payer: Self-pay | Admitting: Vascular Surgery

## 2013-01-24 ENCOUNTER — Ambulatory Visit (HOSPITAL_COMMUNITY)
Admission: RE | Admit: 2013-01-24 | Discharge: 2013-01-24 | Disposition: A | Discharge status: Home / Self Care | Payer: Medicaid Other | Source: Ambulatory Visit | Attending: Interventional Radiology | Admitting: Interventional Radiology

## 2013-01-24 ENCOUNTER — Encounter (HOSPITAL_COMMUNITY): Payer: Self-pay

## 2013-01-24 DIAGNOSIS — R109 Unspecified abdominal pain: Secondary | ICD-10-CM | POA: Insufficient documentation

## 2013-01-24 DIAGNOSIS — I701 Atherosclerosis of renal artery: Secondary | ICD-10-CM | POA: Insufficient documentation

## 2013-01-24 DIAGNOSIS — I708 Atherosclerosis of other arteries: Secondary | ICD-10-CM | POA: Insufficient documentation

## 2013-01-24 DIAGNOSIS — I700 Atherosclerosis of aorta: Secondary | ICD-10-CM | POA: Insufficient documentation

## 2013-01-24 DIAGNOSIS — K559 Vascular disorder of intestine, unspecified: Secondary | ICD-10-CM

## 2013-01-24 DIAGNOSIS — R110 Nausea: Secondary | ICD-10-CM | POA: Insufficient documentation

## 2013-01-24 DIAGNOSIS — K5900 Constipation, unspecified: Secondary | ICD-10-CM | POA: Insufficient documentation

## 2013-01-24 MED ORDER — IOHEXOL 350 MG/ML SOLN
100.0000 mL | Freq: Once | INTRAVENOUS | Status: AC | PRN
  Administered 2013-01-24: 100 mL via INTRAVENOUS

## 2013-01-25 ENCOUNTER — Telehealth: Payer: Self-pay | Admitting: Emergency Medicine

## 2013-01-25 ENCOUNTER — Other Ambulatory Visit (HOSPITAL_COMMUNITY): Payer: Self-pay | Admitting: Interventional Radiology

## 2013-01-25 NOTE — Telephone Encounter (Signed)
CALLED PT TO MAKE HER AWARE THAT DR GY REVIEWED CT AND NO SIGNIFICANT STENOSIS.  HE WANTS TO DISCUSS IN OFFICE  MADE AN APPT FOR 02-02-13 AT 1000/945AM.

## 2013-01-25 NOTE — Telephone Encounter (Signed)
Message copied by Jari Sportsman on Tue Jan 25, 2013  3:09 PM ------      Message from: [place], [place]      Created: Tue Jan 18, 2013  9:27 AM      Regarding: CT RESULTS       PT HAVING CT A AT [facility] ON 01-24-13 AT 93AM, HAVE DR GY TO REVIEW AND ADVISE ON STENTING PROCEDURE. ------

## 2013-02-02 ENCOUNTER — Ambulatory Visit
Admission: RE | Admit: 2013-02-02 | Discharge: 2013-02-02 | Disposition: A | Discharge status: Home / Self Care | Payer: Medicaid Other | Source: Ambulatory Visit | Attending: Interventional Radiology | Admitting: Interventional Radiology

## 2013-02-02 ENCOUNTER — Telehealth (INDEPENDENT_AMBULATORY_CARE_PROVIDER_SITE_OTHER): Payer: Self-pay | Admitting: *Deleted

## 2013-02-02 ENCOUNTER — Inpatient Hospital Stay
Admission: RE | Admit: 2013-02-02 | Discharge: 2013-02-02 | Disposition: A | Discharge status: Home / Self Care | Payer: Medicaid Other | Source: Ambulatory Visit | Attending: Internal Medicine | Admitting: Internal Medicine

## 2013-02-02 NOTE — Telephone Encounter (Signed)
We rec'd info on patient , chart opened to see if she was a patient of this practice.

## 2013-02-02 NOTE — Progress Notes (Signed)
Patient ID: [name], female   DOB: 22-May-1951, 62 y.o.   MRN: [id number]  ESTABLISHED PATIENT OFFICE VISIT  Chief Complaint: Abdominal pain and evaluation for mesenteric ischemia.  History: [name] returns for follow-up. A CTA of the abdomen was performed on 01/24/2013. This did not demonstrate significant stenosis of the superior mesenteric artery as suspected by a standard CT of the abdomen in June. Mild plaque is present causing no hemodynamically significant stenosis by CTA. The celiac axis and inferior mesenteric artery show no significant disease.  The patient complains of recent significant constipation which was not alleviated by over-the-counter medicines. She does have a history of chronic constipation. The patient is going to be contacting Dr. Karilyn Cota for opinion regarding treatment of constipation, her lack of appetite with associated nausea and whether she needs to have another colonoscopy soon given her history of polyp removal.  Review of Systems: No current significant abdominal pain. No fever or chills. No vomiting or diarrhea.  Exam: Vital signs: Blood pressure 184/95, pulse 63, respirations 16, temperature 98.2, oxygen saturation 97% on room air. Abdomen: Soft. Minimal tenderness elicited on the right.  Imaging: CTA of the abdomen on 01/24/2013 was reviewed directly with the patient. There is a mild amount of noncalcified plaque in the proximal trunk of the superior mesenteric artery causing maximal 30 - 40% narrowing. This does not appear significant enough to currently be causing any significant arterial insufficiency. The celiac axis and inferior mesenteric arteries show no significant obstructive disease. There is evidence by CTA of some degree of intimal hyperplasia within a proximal left renal artery stent.  Assessment and Plan: 1. 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 The patient will not  require any percutaneous intervention at this time. I have recommended that she follow-up with Dr. Karilyn Cota for current GI complaints. She will return only if she experiences progressive significant abdominal pain, especially with meals. 2. The patient was noted to be fairly hypertensive today. CTA shows some degree of intimal hyperplasia within the left renal artery stent. The patient is being followed by Dr. Myra Gianotti post left renal artery stenting.

## 2013-02-16 ENCOUNTER — Ambulatory Visit (INDEPENDENT_AMBULATORY_CARE_PROVIDER_SITE_OTHER): Payer: Medicaid Other | Admitting: Internal Medicine

## 2013-02-16 ENCOUNTER — Encounter (INDEPENDENT_AMBULATORY_CARE_PROVIDER_SITE_OTHER): Payer: Self-pay | Admitting: Internal Medicine

## 2013-02-16 VITALS — BP 138/70 | HR 54 | Temp 99.0°F | Ht 59.0 in | Wt 118.6 lb

## 2013-02-16 DIAGNOSIS — R140 Abdominal distension (gaseous): Secondary | ICD-10-CM

## 2013-02-16 DIAGNOSIS — R142 Eructation: Secondary | ICD-10-CM

## 2013-02-16 DIAGNOSIS — K219 Gastro-esophageal reflux disease without esophagitis: Secondary | ICD-10-CM | POA: Insufficient documentation

## 2013-02-16 MED ORDER — PANTOPRAZOLE SODIUM 40 MG PO TBEC: 40.0000 mg | DELAYED_RELEASE_TABLET | Freq: Every day | ORAL | Status: DC

## 2013-02-16 NOTE — Patient Instructions (Addendum)
Gastric emptying study. OV in 1 months

## 2013-02-16 NOTE — Progress Notes (Signed)
Subjective:     Patient ID: [name], female   DOB: 02-08-1951, 62 y.o.   MRN: [id number]  HPI She presents today with c/o of abdominal bloating. She says she saw Dr. Sherril Croon for this in June.  She requested to be admitted. She underwent CT exam and was referred to Dr. Fredia Sorrow.  She tells me today she says she has some bloating today.  She says some days she is okay and somedays she will bloat/ Appetite is not good. She has lost about 4 pounds over a month.  No abdominal pain.  There is no abdominal pain after she eats. She will however bloat. Occasionally has acid reflux. She avoid fried foods, pork. She eats vegetables and fruits. She usually has a BM about every day. No melena or bright red rectal bleeding.  She has an extensive cardiac hx with an MI 4/10 and a left renal artery stenting on 06/2012, and a hx of carotid artery disease, Cardiac stent x 3 10/25/2008 Colonoscopy 03/27/2010 Dr. Karilyn Cota: Indications: multiple medical problems who underwent colonoscopy for screening purposes about 4 weeks ago. She had a redundant colon exam with incomplete hepatic flexure. She had 2 polyps which were biopsied for histology and treated with APC and both of the polyps turned out to be adenomatous.  Final diagnosis: Exam performed to cecum under fluoroscopy. No evidence of residual polyps. Mild changes of melanosis coli and small external hemorrhoids. Next colonoscopy in 5 yrs.   01/18/2013 CT angiography Abdomen: Indications:Clinical Data: Abdominal pain for several months, nausea,  constipation, evaluate for mesenteric ischemia and SMA stenosis   IMPRESSION:  1. Moderate amount of slightly irregular mixed calcified and  noncalcified atherosclerotic plaque within the normal caliber  abdominal aorta. No abdominal aortic dissection or periaortic  stranding.  2. Grossly unchanged atherosclerotic plaque within the SMA, not  definitely resulting in hemodynamically significant stenosis,  though note,  evaluation of the mid aspect of the SMA is degraded  secondary to patient respiratory artifact.  4. Incidental note made of a replaced right hepatic artery arising  from the proximal SMA.  4. Post stenting of the left renal artery with suspected minimal  amount of ostial and in-stent stenosis which may approach 50%  luminal narrowing.  5. Suspected hemodynamically significant narrowing involving the  origin and proximal aspect of the right renal artery which appears  to approach 50% luminal narrowing.  6. Grossly unchanged irregular atherosclerotic plaque within the  bilateral common iliac arteries, the left side of which likely  approaches 50% luminal narrowing. Suspected short segment  dissection involving the right common iliac artery, not resulting  in hemodynamically significant narrowing.      Review of Systems see hpi Current Outpatient Prescriptions  Medication Sig Dispense Refill  . aspirin EC 81 MG tablet Take 1 tablet (81 mg total) by mouth daily.      . carisoprodol (SOMA) 350 MG tablet Take 350 mg by mouth at bedtime.        . clopidogrel (PLAVIX) 75 MG tablet Take 75 mg by mouth daily.      . diazepam (VALIUM) 10 MG tablet Take 10 mg by mouth every 8 (eight) hours as needed. For anxiety      . folic acid (FOLVITE) 1 MG tablet Take 1 mg by mouth daily.        . metoprolol succinate (TOPROL-XL) 25 MG 24 hr tablet TAKE ONE TABLET ONCE DAILY  30 tablet  5  . nitroGLYCERIN (NITROSTAT) 0.4 MG  SL tablet Place 0.4 mg under the tongue every 5 (five) minutes as needed. For chest pain      . Omega-3 Fatty Acids (FISH OIL) 1200 MG CAPS Take 1 capsule by mouth 3 (three) times daily.        Marland Kitchen oxyCODONE-acetaminophen (PERCOCET) 10-325 MG per tablet Take 1 tablet by mouth every 6 (six) hours as needed. For pain      . potassium chloride (K-DUR) 10 MEQ tablet Take 10 mEq by mouth daily as needed. Use only when Potassium low. (Legs start cramping)      . predniSONE (DELTASONE) 5 MG tablet  Take 5 mg by mouth daily.      . simvastatin (ZOCOR) 40 MG tablet Take 20 mg by mouth every evening.       . triamterene-hydrochlorothiazide (MAXZIDE-25) 37.5-25 MG per tablet Take 0.5 tablets by mouth daily.      . Vitamin D, Ergocalciferol, (DRISDOL) 50000 UNITS CAPS Take 50,000 Units by mouth every 7 (seven) days. Take on mondays       No current facility-administered medications for this visit.   Past Medical History  Diagnosis Date  . Essential hypertension, benign   . GERD (gastroesophageal reflux disease)   . Depression   . Type 2 diabetes mellitus   . Anxiety   . Hiatal hernia   . NSTEMI (non-ST elevated myocardial infarction)     4/10  . Hyperlipidemia   . Carotid artery disease     Dr. Myra Gianotti, 60-79% RICA, s/p left CEA  . Fibromyalgia   . Coronary atherosclerosis of native coronary artery     DES RCA/circumflex 4/10, LVEF 65  . Arthritis     Rhumatoid and Osteoarthritis  . Renal artery stenosis     Left renal artery stent 12/13  . Restless leg syndrome   . Spondylosis without myelopathy    Past Surgical History  Procedure Laterality Date  . Tubal ligation    . Breast lumpectomy    . Lumbar disc surgery      L5-S1  . Total knee arthroplasty      Left  . Neck fusion  1996    C4 to C6  . Spine surgery  1994    Ruptured disc  . Joint replacement  March 2013    Left knee  . Joint replacement  2006    Left Knee  . Tonsilectomy, adenoidectomy, bilateral myringotomy and tubes  1969  . Abdominal hysterectomy  1977  . Cholecystectomy  1991  . Colonoscopy    . Eye surgery      Catartact bilateral  . Carotid endarterectomy Left 02/28/2009  . Kidney stent      Left December 2013   Allergies  Allergen Reactions  . Celecoxib     REACTION: itching  . Codeine     REACTION: itching  . Lamictal (Lamotrigine)   . Lyrica (Pregabalin)     REACTION: headaches,blurred vision  . Metoclopramide Hcl     Unknown  . Morphine And Related     Unknown  . Savella  (Milnacipran Hcl)   . Trazodone And Nefazodone     hallucinations  . Venlafaxine     Affected eyes  . Citalopram Palpitations    Heart rate increased  . Sertraline Hcl Other (See Comments)    Stomach upset       Objective:   Physical Exam  Filed Vitals:   02/16/13 1111  BP: 138/70  Pulse: 54  Temp: 99 F (37.2 C)  Height: 4\' 11"  (1.499 m)  Weight: 118 lb 9.6 oz (53.797 kg)   Alert and oriented. Skin warm and dry. Oral mucosa is moist.   . Sclera anicteric, conjunctivae is pink. Thyroid not enlarged. No cervical lymphadenopathy. Lungs clear. Heart regular rate and rhythm.  Abdomen is soft. Bowel sounds are positive. No hepatomegaly. No abdominal masses felt. No tenderness.  No edema to lower extremities.       Assessment:    Bloating. Extensive hx of cardiac and renal disease.   Per Dr. Antonietta Jewel notes: there was no evidence of significant stenosis of t he proximal superior mesenteric artery by CTA. The patient would not require percutaneous intervention at this time.  Assessment and Plan:   Plan:   Rx for Protonix 40mg  daily. OV in 1 month.  She needs to eat small frequent meals. Gastric emptying study.

## 2013-02-21 ENCOUNTER — Encounter (HOSPITAL_COMMUNITY)
Admission: RE | Admit: 2013-02-21 | Discharge: 2013-02-21 | Disposition: A | Discharge status: Home / Self Care | Payer: Medicaid Other | Source: Ambulatory Visit | Attending: Internal Medicine | Admitting: Internal Medicine

## 2013-02-21 ENCOUNTER — Encounter (HOSPITAL_COMMUNITY): Payer: Self-pay

## 2013-02-21 DIAGNOSIS — K219 Gastro-esophageal reflux disease without esophagitis: Secondary | ICD-10-CM | POA: Insufficient documentation

## 2013-02-21 MED ORDER — TECHNETIUM TC 99M SULFUR COLLOID
2.0000 | Freq: Once | INTRAVENOUS | Status: AC | PRN
  Administered 2013-02-21: 2 via ORAL

## 2013-03-03 ENCOUNTER — Encounter (INDEPENDENT_AMBULATORY_CARE_PROVIDER_SITE_OTHER): Payer: Self-pay

## 2013-03-21 ENCOUNTER — Other Ambulatory Visit (INDEPENDENT_AMBULATORY_CARE_PROVIDER_SITE_OTHER): Payer: Self-pay | Admitting: *Deleted

## 2013-03-21 ENCOUNTER — Ambulatory Visit (INDEPENDENT_AMBULATORY_CARE_PROVIDER_SITE_OTHER): Payer: Medicaid Other | Admitting: Internal Medicine

## 2013-03-21 ENCOUNTER — Encounter (INDEPENDENT_AMBULATORY_CARE_PROVIDER_SITE_OTHER): Payer: Self-pay | Admitting: *Deleted

## 2013-03-21 ENCOUNTER — Encounter (INDEPENDENT_AMBULATORY_CARE_PROVIDER_SITE_OTHER): Payer: Self-pay | Admitting: Internal Medicine

## 2013-03-21 VITALS — BP 118/54 | HR 76 | Temp 98.7°F | Ht 59.0 in | Wt 117.2 lb

## 2013-03-21 DIAGNOSIS — K219 Gastro-esophageal reflux disease without esophagitis: Secondary | ICD-10-CM

## 2013-03-21 DIAGNOSIS — R935 Abnormal findings on diagnostic imaging of other abdominal regions, including retroperitoneum: Secondary | ICD-10-CM

## 2013-03-21 DIAGNOSIS — R140 Abdominal distension (gaseous): Secondary | ICD-10-CM

## 2013-03-21 DIAGNOSIS — R109 Unspecified abdominal pain: Secondary | ICD-10-CM

## 2013-03-21 NOTE — Progress Notes (Addendum)
Subjective:     Patient ID: [name], female   DOB: February 19, 1951, 62 y.o.   MRN: [id number]  HPI Here today for f/u of her bloating. Last seen in August of this year. She underwent a Gastric emptying study which showed moderately delayed solid gastric emptying.  She occasionally has some bloating.  She sometimes has epigastric pain after eating. The pain usually last from a few minutes to several hours. Sometimes she has problems swallowing pills.  She is taking Protonix which helps. She is eating 3-4 small meals a day. She says sometimes she just doesn't have much of an appetite. There has been no weight loss. Her BMs are normal. She did develop diarrhea.  Presently taking Amoxicillin she says for a viral infection.  Her last colonoscopy was in 2011 with polyps removed.  02/16/2014 Gastric emptying Study: Findings: Anterior images of the abdomen demonstrate moderately  delayed emptying of the stomach.  Calculated retention in the stomach is 88% at 1 hour and 47% at 2  hours. Normal retention is less than 30% at 2 hours.  IMPRESSION:  Moderately delayed solid gastric emptying.   Review of Systems Current Outpatient Prescriptions  Medication Sig Dispense Refill  . amoxicillin (AMOXIL) 500 MG capsule Take 500 mg by mouth 3 (three) times daily.      Marland Kitchen aspirin EC 81 MG tablet Take 1 tablet (81 mg total) by mouth daily.      . carisoprodol (SOMA) 350 MG tablet Take 350 mg by mouth at bedtime.        . clopidogrel (PLAVIX) 75 MG tablet Take 75 mg by mouth daily.      . diazepam (VALIUM) 10 MG tablet Take 10 mg by mouth every 8 (eight) hours as needed. For anxiety      . folic acid (FOLVITE) 1 MG tablet Take 1 mg by mouth daily.        . metoprolol succinate (TOPROL-XL) 25 MG 24 hr tablet TAKE ONE TABLET ONCE DAILY  30 tablet  5  . nitroGLYCERIN (NITROSTAT) 0.4 MG SL tablet Place 0.4 mg under the tongue every 5 (five) minutes as needed. For chest pain      . oxyCODONE-acetaminophen  (PERCOCET) 10-325 MG per tablet Take 1 tablet by mouth every 6 (six) hours as needed. For pain      . pantoprazole (PROTONIX) 40 MG tablet Take 1 tablet (40 mg total) by mouth daily.  90 tablet  3  . potassium chloride (K-DUR) 10 MEQ tablet Take 10 mEq by mouth daily as needed. Use only when Potassium low. (Legs start cramping)      . promethazine (PHENERGAN) 25 MG tablet Take 25 mg by mouth every 6 (six) hours as needed for nausea.      . simvastatin (ZOCOR) 40 MG tablet Take 20 mg by mouth every evening.       . triamterene-hydrochlorothiazide (MAXZIDE-25) 37.5-25 MG per tablet Take 0.5 tablets by mouth daily.      . Vitamin D, Ergocalciferol, (DRISDOL) 50000 UNITS CAPS Take 50,000 Units by mouth every 7 (seven) days. Take on mondays      . Omega-3 Fatty Acids (FISH OIL) 1200 MG CAPS Take 1 capsule by mouth 3 (three) times daily.         No current facility-administered medications for this visit.   Past Medical History  Diagnosis Date  . Essential hypertension, benign   . GERD (gastroesophageal reflux disease)   . Depression   . Type 2 diabetes  mellitus   . Anxiety   . Hiatal hernia   . NSTEMI (non-ST elevated myocardial infarction)     4/10  . Hyperlipidemia   . Carotid artery disease     Dr. Myra Gianotti, 60-79% RICA, s/p left CEA  . Fibromyalgia   . Coronary atherosclerosis of native coronary artery     DES RCA/circumflex 4/10, LVEF 65  . Arthritis     Rhumatoid and Osteoarthritis  . Renal artery stenosis     Left renal artery stent 12/13  . Restless leg syndrome   . Spondylosis without myelopathy    Past Surgical History  Procedure Laterality Date  . Tubal ligation    . Breast lumpectomy    . Lumbar disc surgery      L5-S1  . Total knee arthroplasty      Left  . Neck fusion  1996    C4 to C6  . Spine surgery  1994    Ruptured disc  . Joint replacement  March 2013    Left knee  . Joint replacement  2006    Left Knee  . Tonsilectomy, adenoidectomy, bilateral  myringotomy and tubes  1969  . Abdominal hysterectomy  1977  . Cholecystectomy  1991  . Colonoscopy    . Eye surgery      Catartact bilateral  . Carotid endarterectomy Left 02/28/2009  . Kidney stent      Left December 2013   Allergies  Allergen Reactions  . Celecoxib     REACTION: itching  . Codeine     REACTION: itching  . Lamictal [Lamotrigine]   . Lyrica [Pregabalin]     REACTION: headaches,blurred vision  . Metoclopramide Hcl     Unknown  . Morphine And Related     Unknown  . Savella [Milnacipran Hcl]   . Trazodone And Nefazodone     hallucinations  . Venlafaxine     Affected eyes  . Citalopram Palpitations    Heart rate increased  . Sertraline Hcl Other (See Comments)    Stomach upset       Objective:   Physical Exam  Filed Vitals:   03/21/13 1446  BP: 118/54  Pulse: 76  Temp: 98.7 F (37.1 C)  Height: 4\' 11"  (1.499 m)  Weight: 117 lb 3.2 oz (53.162 kg)   Alert and oriented. Skin warm and dry. Oral mucosa is moist.   . Sclera anicteric, conjunctivae is pink. Thyroid not enlarged. No cervical lymphadenopathy. Lungs clear. Heart regular rate and rhythm.  Abdomen is soft. Bowel sounds are positive. No hepatomegaly. No abdominal masses felt. Epigastric tenderness.  No edema to lower extremities.       Assessment:    GERD.. She does have some epigastric pain after eating. She had an abnormal Gastric emptying study. PUD needs to be ruled. Abnormal Gastric emptying study: eat small meals. Chew food well.     Plan:   EGD.The risks and benefits such as perforation, bleeding, and infection were reviewed with the patient and is agreeable. Continue the Protonix.

## 2013-03-21 NOTE — Patient Instructions (Addendum)
EGD. The risks and benefits such as perforation, bleeding, and infection were reviewed with the patient and is agreeable. 

## 2013-03-24 ENCOUNTER — Encounter (HOSPITAL_COMMUNITY): Payer: Self-pay | Admitting: Pharmacy Technician

## 2013-04-07 ENCOUNTER — Encounter (HOSPITAL_COMMUNITY)
Admission: RE | Disposition: A | Discharge status: Home / Self Care | Payer: Self-pay | Source: Ambulatory Visit | Attending: Internal Medicine

## 2013-04-07 ENCOUNTER — Ambulatory Visit (HOSPITAL_COMMUNITY)
Admission: RE | Admit: 2013-04-07 | Discharge: 2013-04-07 | Disposition: A | Discharge status: Home / Self Care | Payer: Medicaid Other | Source: Ambulatory Visit | Attending: Internal Medicine | Admitting: Internal Medicine

## 2013-04-07 ENCOUNTER — Encounter (HOSPITAL_COMMUNITY): Payer: Self-pay

## 2013-04-07 DIAGNOSIS — R935 Abnormal findings on diagnostic imaging of other abdominal regions, including retroperitoneum: Secondary | ICD-10-CM

## 2013-04-07 DIAGNOSIS — R109 Unspecified abdominal pain: Secondary | ICD-10-CM | POA: Insufficient documentation

## 2013-04-07 DIAGNOSIS — K2960 Other gastritis without bleeding: Secondary | ICD-10-CM | POA: Insufficient documentation

## 2013-04-07 DIAGNOSIS — E119 Type 2 diabetes mellitus without complications: Secondary | ICD-10-CM | POA: Insufficient documentation

## 2013-04-07 DIAGNOSIS — K219 Gastro-esophageal reflux disease without esophagitis: Secondary | ICD-10-CM

## 2013-04-07 DIAGNOSIS — R141 Gas pain: Secondary | ICD-10-CM

## 2013-04-07 DIAGNOSIS — R143 Flatulence: Secondary | ICD-10-CM

## 2013-04-07 DIAGNOSIS — I10 Essential (primary) hypertension: Secondary | ICD-10-CM | POA: Insufficient documentation

## 2013-04-07 DIAGNOSIS — K2970 Gastritis, unspecified, without bleeding: Secondary | ICD-10-CM

## 2013-04-07 DIAGNOSIS — R142 Eructation: Secondary | ICD-10-CM

## 2013-04-07 DIAGNOSIS — R140 Abdominal distension (gaseous): Secondary | ICD-10-CM

## 2013-04-07 HISTORY — PX: ESOPHAGOGASTRODUODENOSCOPY: SHX5428

## 2013-04-07 SURGERY — EGD (ESOPHAGOGASTRODUODENOSCOPY)
Anesthesia: Moderate Sedation

## 2013-04-07 MED ORDER — MEPERIDINE HCL 50 MG/ML IJ SOLN
INTRAMUSCULAR | Status: AC
  Filled 2013-04-07: qty 1

## 2013-04-07 MED ORDER — SODIUM CHLORIDE 0.9 % IV SOLN
INTRAVENOUS | Status: DC
  Administered 2013-04-07: 14:00:00 via INTRAVENOUS

## 2013-04-07 MED ORDER — DICYCLOMINE HCL 10 MG PO CAPS: 10.0000 mg | ORAL_CAPSULE | Freq: Three times a day (TID) | ORAL | Status: DC

## 2013-04-07 MED ORDER — BUTAMBEN-TETRACAINE-BENZOCAINE 2-2-14 % EX AERO
INHALATION_SPRAY | CUTANEOUS | Status: DC | PRN
  Administered 2013-04-07: 2 via TOPICAL

## 2013-04-07 MED ORDER — MIDAZOLAM HCL 5 MG/5ML IJ SOLN
INTRAMUSCULAR | Status: AC
  Filled 2013-04-07: qty 5

## 2013-04-07 MED ORDER — MEPERIDINE HCL 50 MG/ML IJ SOLN
INTRAMUSCULAR | Status: DC | PRN
  Administered 2013-04-07 (×2): 25 mg via INTRAVENOUS

## 2013-04-07 MED ORDER — STERILE WATER FOR IRRIGATION IR SOLN
Status: DC | PRN
  Administered 2013-04-07: 16:00:00

## 2013-04-07 MED ORDER — MIDAZOLAM HCL 5 MG/5ML IJ SOLN
INTRAMUSCULAR | Status: AC
  Filled 2013-04-07: qty 10

## 2013-04-07 MED ORDER — MIDAZOLAM HCL 5 MG/5ML IJ SOLN
INTRAMUSCULAR | Status: DC | PRN
  Administered 2013-04-07: 2 mg via INTRAVENOUS
  Administered 2013-04-07: 3 mg via INTRAVENOUS
  Administered 2013-04-07 (×2): 2 mg via INTRAVENOUS
  Administered 2013-04-07 (×2): 3 mg via INTRAVENOUS

## 2013-04-07 NOTE — Op Note (Signed)
EGD PROCEDURE REPORT  PATIENT:  [name]  MR#:  [id number] Birthdate:  21-Aug-1950, 62 y.o., female Endoscopist:  Dr. Malissa Hippo, MD Referred By:  Dr. Ignatius Specking, MD Procedure Date: 04/07/2013  Procedure:   EGD  Indications:  Patient is 62 year old Caucasian female with multiple medical problems who presents with recurrent postprandial abdominal pain bloating and at times she has painful defecation. She had gastric emptying study recently which was abnormal. Therefore we need to rule out pyloric stenosis of peptic ulcer disease. 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            Informed Consent:  The risks, benefits, alternatives & imponderables which include, but are not limited to, bleeding, infection, perforation, drug reaction and potential missed lesion have been reviewed.  The potential for biopsy, lesion removal, esophageal dilation, etc. have also been discussed.  Questions have been answered.  All parties agreeable.  Please see history & physical in medical record for more information.  Medications:  Demerol 50 mg IV Versed 15 mg IV Cetacaine spray topically for oropharyngeal anesthesia  Description of procedure:  The endoscope was introduced through the mouth and advanced to the second portion of the duodenum without difficulty or limitations. The mucosal surfaces were surveyed very carefully during advancement of the scope and upon withdrawal.  Findings:  Esophagus:  Mucosa of the esophagus was normal. GE junction was unremarkable. GEJ:  36 cm Stomach:  Stomach was empty and distended very well with insufflation. Folds in the proximal stomach are normal. Examination mucosa revealed patchy erythema and edema at antrum without erosions or ulcers. Pyloric channel was patent. Angularis, fundus and cardia were unremarkable. Duodenum:  Normal bulbar and post bulbar mucosa.  Therapeutic/Diagnostic Maneuvers  Performed:  None  Complications:  None  Impression: Nonerosive antral gastritis otherwise normal EGD.  Some of her symptoms are suggestive of irritable bowel syndrome. Will try her on low-dose anti-spasmodic. Will have to monitor her symptoms closely since she has mild gastroparesis and dicyclomine may worsen her gastroparesis.  Recommendations:  H. Pylori serology. Dicyclomine 10 mg by mouth 3 times a day.  REHMAN,NAJEEB U  04/07/2013  4:42 PM  CC: Dr. Ignatius Specking., MD & Dr. Bonnetta Barry ref. provider found

## 2013-04-07 NOTE — H&P (Signed)
[name] is an 62 y.o. female.   Chief Complaint: Patient is here for EGD. HPI: Patient is 62 year old Caucasian female with multiple medical problems including chronic GERD who presents with postprandial abdominal pain bloating and urgency. She has extensive atherosclerosis. She had abdominal angiography 2 months ago and felt to have insignificant disease involving celiac trunk and SMA. She states she has lost 10 pounds this year. She denies melena or rectal bleeding. She has nausea but no vomiting. Last one she had solid phase gastric emptying study revealing slow emptying..  Past Medical History  Diagnosis Date  . Essential hypertension, benign   . GERD (gastroesophageal reflux disease)   . Depression   . Type 2 diabetes mellitus   . Anxiety   . Hiatal hernia   . NSTEMI (non-ST elevated myocardial infarction)     4/10  . Hyperlipidemia   . Carotid artery disease     Dr. Myra Gianotti, 60-79% RICA, s/p left CEA  . Fibromyalgia   . Coronary atherosclerosis of native coronary artery     DES RCA/circumflex 4/10, LVEF 65  . Arthritis     Rhumatoid and Osteoarthritis  . Renal artery stenosis     Left renal artery stent 12/13  . Restless leg syndrome   . Spondylosis without myelopathy     Past Surgical History  Procedure Laterality Date  . Tubal ligation    . Breast lumpectomy    . Lumbar disc surgery      L5-S1  . Total knee arthroplasty      Left  . Neck fusion  1996    C4 to C6  . Spine surgery  1994    Ruptured disc  . Joint replacement  March 2013    Left knee  . Joint replacement  2006    Left Knee  . Tonsilectomy, adenoidectomy, bilateral myringotomy and tubes  1969  . Abdominal hysterectomy  1977  . Cholecystectomy  1991  . Colonoscopy    . Eye surgery      Catartact bilateral  . Carotid endarterectomy Left 02/28/2009  . Kidney stent      Left December 2013    Family History  Problem Relation Age of Onset  . Other Mother     Cerebral hemorrage  . Heart  disease Mother   . Heart attack Mother   . Heart disease Father   . Heart attack Father   . Heart disease Brother     Heart Disease before age 40  . Diabetes Brother   . Hypertension Brother   . Heart attack Brother   . Cancer Sister   . Arthritis Sister   . Heart disease Sister   . Heart disease Daughter 57    Heart Disease before age 30  . Heart attack Daughter   . Cancer Sister   . Arthritis Sister   . Diabetes Brother     Varicose  Veins  . Peripheral vascular disease Brother   . Diabetes Brother    Social History:  reports that she quit smoking about 4 years ago. Her smoking use included Cigarettes. She has a 45 pack-year smoking history. She has never used smokeless tobacco. She reports that she does not drink alcohol or use illicit drugs.  Allergies:  Allergies  Allergen Reactions  . Celecoxib     REACTION: itching  . Codeine     REACTION: itching  . Lamictal [Lamotrigine]   . Lyrica [Pregabalin]     REACTION: headaches,blurred vision  .  Metoclopramide Hcl     Unknown  . Morphine And Related     Unknown  . Savella [Milnacipran Hcl]   . Trazodone And Nefazodone     hallucinations  . Venlafaxine     Affected eyes  . Citalopram Palpitations    Heart rate increased  . Sertraline Hcl Other (See Comments)    Stomach upset    Medications Prior to Admission  Medication Sig Dispense Refill  . aspirin EC 81 MG tablet Take 1 tablet (81 mg total) by mouth daily.      . carisoprodol (SOMA) 350 MG tablet Take 350 mg by mouth at bedtime.        . clopidogrel (PLAVIX) 75 MG tablet Take 75 mg by mouth daily.      . diazepam (VALIUM) 10 MG tablet Take 10 mg by mouth every 8 (eight) hours as needed. For anxiety      . folic acid (FOLVITE) 1 MG tablet Take 1 mg by mouth daily.        . metoprolol succinate (TOPROL-XL) 25 MG 24 hr tablet TAKE ONE TABLET ONCE DAILY  30 tablet  5  . Omega-3 Fatty Acids (FISH OIL) 1200 MG CAPS Take 1 capsule by mouth 3 (three) times daily.         Marland Kitchen oxyCODONE-acetaminophen (PERCOCET) 10-325 MG per tablet Take 1 tablet by mouth every 6 (six) hours as needed. For pain      . pantoprazole (PROTONIX) 40 MG tablet Take 1 tablet (40 mg total) by mouth daily.  90 tablet  3  . potassium chloride (K-DUR) 10 MEQ tablet Take 10 mEq by mouth daily as needed. Use only when Potassium low. (Legs start cramping)      . promethazine (PHENERGAN) 25 MG tablet Take 25 mg by mouth every 6 (six) hours as needed for nausea.      . simvastatin (ZOCOR) 40 MG tablet Take 20 mg by mouth every evening.       . triamterene-hydrochlorothiazide (MAXZIDE-25) 37.5-25 MG per tablet Take 0.5 tablets by mouth daily.      . Vitamin D, Ergocalciferol, (DRISDOL) 50000 UNITS CAPS Take 50,000 Units by mouth every 7 (seven) days. Take on mondays      . nitroGLYCERIN (NITROSTAT) 0.4 MG SL tablet Place 0.4 mg under the tongue every 5 (five) minutes as needed. For chest pain        No results found for this or any previous visit (from the past 48 hour(s)). No results found.  ROS  Blood pressure 178/69, pulse 62, temperature 97.9 F (36.6 C), temperature source Oral, resp. rate 22, height 4\' 11"  (1.499 m), weight 117 lb (53.071 kg), SpO2 97.00%. Physical Exam  Constitutional:  Well-developed thin Caucasian female in NAD  HENT:  Mouth/Throat: Oropharynx is clear and moist.  Eyes: Conjunctivae are normal. No scleral icterus.  Neck: No thyromegaly present.  Cardiovascular: Normal rate and regular rhythm.   Murmur (faint SEM at LLSB) heard. Respiratory: Effort normal and breath sounds normal.  GI: She exhibits no mass. Tenderness: mild to moderate midepigastric tenderness; mild tenderness in the rest of her abdomen which is soft.  Musculoskeletal: She exhibits no edema.  Lymphadenopathy:    She has no cervical adenopathy.  Neurological: She is alert.  Skin: Skin is warm and dry.     Assessment/Plan Abdominal pain and bloating. Abnormal solid phase gastric emptying  study. Diagnostic EGD.  [name] U 04/07/2013, 4:12 PM

## 2013-04-08 LAB — H. PYLORI ANTIBODY, IGG: H Pylori IgG: 0.89 {ISR}

## 2013-04-12 ENCOUNTER — Encounter (HOSPITAL_COMMUNITY): Payer: Self-pay | Admitting: Internal Medicine

## 2013-05-03 ENCOUNTER — Encounter: Payer: Self-pay | Admitting: Cardiology

## 2013-05-03 ENCOUNTER — Ambulatory Visit (INDEPENDENT_AMBULATORY_CARE_PROVIDER_SITE_OTHER): Payer: Medicaid Other | Admitting: Cardiology

## 2013-05-03 VITALS — BP 146/67 | HR 65 | Ht 59.0 in | Wt 111.0 lb

## 2013-05-03 DIAGNOSIS — K0889 Other specified disorders of teeth and supporting structures: Secondary | ICD-10-CM | POA: Insufficient documentation

## 2013-05-03 DIAGNOSIS — I2510 Atherosclerotic heart disease of native coronary artery without angina pectoris: Secondary | ICD-10-CM

## 2013-05-03 DIAGNOSIS — I6529 Occlusion and stenosis of unspecified carotid artery: Secondary | ICD-10-CM

## 2013-05-03 DIAGNOSIS — E785 Hyperlipidemia, unspecified: Secondary | ICD-10-CM

## 2013-05-03 DIAGNOSIS — K089 Disorder of teeth and supporting structures, unspecified: Secondary | ICD-10-CM

## 2013-05-03 NOTE — Assessment & Plan Note (Signed)
She is worried that she might have an abscessed tooth. States she was not able to see her previous dentist, now on Medicaid. Office staff located two local dentists that take Medicaid. We gave the patient the contact information so that she could make a visit.

## 2013-05-03 NOTE — Assessment & Plan Note (Signed)
Followed by Dr Brabham 

## 2013-05-03 NOTE — Assessment & Plan Note (Signed)
Symptomatically stable on medical therapy. Stress testing from last year was low risk. Continue observation.

## 2013-05-03 NOTE — Patient Instructions (Signed)

## 2013-05-03 NOTE — Assessment & Plan Note (Signed)
She continues on Zocor. Keep follow up with Dr. Sherril Croon.

## 2013-05-03 NOTE — Progress Notes (Signed)
Clinical Summary [name] is a 62 y.o.female last seen in April. She has had interval extensive GI workup, records reviewed. She is being followed by Dr. Karilyn Cota. Has been having problems with recurrent abdominal bloating.  From a cardiac perspective, she has fortunately not had any accelerating angina symptoms.  Stress echocardiogram done in October 2013 was negative for ischemia. We continue to manage her medically.   Allergies  Allergen Reactions  . Celecoxib     REACTION: itching  . Codeine     REACTION: itching  . Dicyclomine     Made diarrhea worse  . Lamictal [Lamotrigine]   . Lyrica [Pregabalin]     REACTION: headaches,blurred vision  . Metoclopramide Hcl     Unknown  . Morphine And Related     Unknown  . Savella [Milnacipran Hcl]   . Trazodone And Nefazodone     hallucinations  . Venlafaxine     Affected eyes  . Citalopram Palpitations    Heart rate increased  . Sertraline Hcl Other (See Comments)    Stomach upset    Current Outpatient Prescriptions  Medication Sig Dispense Refill  . aspirin EC 81 MG tablet Take 1 tablet (81 mg total) by mouth daily.      . carisoprodol (SOMA) 350 MG tablet Take 350 mg by mouth 2 (two) times daily as needed.       . clopidogrel (PLAVIX) 75 MG tablet Take 75 mg by mouth daily.      . diazepam (VALIUM) 10 MG tablet Take 10 mg by mouth every 8 (eight) hours as needed. For anxiety      . folic acid (FOLVITE) 1 MG tablet Take 1 mg by mouth daily.        . Garlic (GARLIQUE) 400 MG TBEC Take 1 tablet by mouth daily.      . metoprolol succinate (TOPROL-XL) 25 MG 24 hr tablet TAKE ONE TABLET ONCE DAILY  30 tablet  5  . nitroGLYCERIN (NITROSTAT) 0.4 MG SL tablet Place 0.4 mg under the tongue every 5 (five) minutes as needed. For chest pain      . oxyCODONE-acetaminophen (PERCOCET) 10-325 MG per tablet Take 1 tablet by mouth every 6 (six) hours as needed. For pain      . pantoprazole (PROTONIX) 40 MG tablet Take 1 tablet (40 mg  total) by mouth daily.  90 tablet  3  . potassium chloride (K-DUR) 10 MEQ tablet Take 10 mEq by mouth daily as needed. Use only when Potassium low. (Legs start cramping)      . promethazine (PHENERGAN) 25 MG tablet Take 25 mg by mouth every 6 (six) hours as needed for nausea.      . simvastatin (ZOCOR) 40 MG tablet Take 20 mg by mouth every evening.       . triamterene-hydrochlorothiazide (MAXZIDE-25) 37.5-25 MG per tablet Take 0.5 tablets by mouth daily.      . Vitamin D, Ergocalciferol, (DRISDOL) 50000 UNITS CAPS Take 50,000 Units by mouth every 7 (seven) days. Take on mondays       No current facility-administered medications for this visit.    Past Medical History  Diagnosis Date  . Essential hypertension, benign   . GERD (gastroesophageal reflux disease)   . Depression   . Type 2 diabetes mellitus   . Anxiety   . Hiatal hernia   . NSTEMI (non-ST elevated myocardial infarction)     4/10  . Hyperlipidemia   . Carotid artery disease  Dr. Myra Gianotti, 60-79% RICA, s/p left CEA  . Fibromyalgia   . Coronary atherosclerosis of native coronary artery     DES RCA/circumflex 4/10, LVEF 65  . Arthritis     Rhumatoid and Osteoarthritis  . Renal artery stenosis     Left renal artery stent 12/13  . Restless leg syndrome   . Spondylosis without myelopathy     Social History [name] reports that she quit smoking about 4 years ago. Her smoking use included Cigarettes. She has a 45 pack-year smoking history. She has never used smokeless tobacco. [name] reports that she does not drink alcohol.  Review of Systems Thinks that she might have an abscessed tooth. She has not yet seen a dentist. No fevers or chills. No angina symptoms, stable dyspnea on exertion.  Physical Examination Filed Vitals:   05/03/13 1519  BP: 146/67  Pulse: 65   Filed Weights   05/03/13 1519  Weight: 111 lb (50.349 kg)    No distress. HEENT: Conjunctiva and lids normal, oropharynx clear.  Neck:  Supple, no elevated JVP, bilateral carotid bruits noted, right greater than left. Well-healed CEA scar on left.  Lungs: Clear to auscultation, diminished, nonlabored.  Cardiac: Regular rate and rhythm, no S3 or rub. Soft systolic murmur and S4.  Extremities: No pitting edema.    Problem List and Plan   CAD, NATIVE VESSEL Symptomatically stable on medical therapy. Stress testing from last year was low risk. Continue observation.  CAROTID OCCLUSIVE DISEASE Followed by Dr. Myra Gianotti.  DYSLIPIDEMIA She continues on Zocor. Keep follow up with Dr. Sherril Croon.  Tooth pain She is worried that she might have an abscessed tooth. States she was not able to see her previous dentist, now on Medicaid. Office staff located two local dentists that take Medicaid. We gave the patient the contact information so that she could make a visit.    Jonelle Sidle, M.D., F.A.C.C.

## 2013-05-16 ENCOUNTER — Ambulatory Visit (INDEPENDENT_AMBULATORY_CARE_PROVIDER_SITE_OTHER): Payer: Medicaid Other | Admitting: Internal Medicine

## 2013-05-24 ENCOUNTER — Other Ambulatory Visit: Payer: Self-pay | Admitting: Surgery

## 2013-05-24 DIAGNOSIS — Z48812 Encounter for surgical aftercare following surgery on the circulatory system: Secondary | ICD-10-CM

## 2013-05-24 DIAGNOSIS — I701 Atherosclerosis of renal artery: Secondary | ICD-10-CM

## 2013-06-27 ENCOUNTER — Other Ambulatory Visit (HOSPITAL_COMMUNITY): Payer: Medicaid Other

## 2013-06-27 ENCOUNTER — Ambulatory Visit: Payer: Medicaid Other | Admitting: Surgery

## 2013-06-27 ENCOUNTER — Other Ambulatory Visit: Payer: Medicaid Other

## 2013-07-14 HISTORY — PX: MULTIPLE TOOTH EXTRACTIONS: SHX2053

## 2013-07-15 ENCOUNTER — Other Ambulatory Visit (HOSPITAL_COMMUNITY): Payer: Self-pay | Admitting: Cardiology

## 2013-07-19 ENCOUNTER — Other Ambulatory Visit: Payer: Self-pay | Admitting: Surgery

## 2013-07-19 DIAGNOSIS — Z48812 Encounter for surgical aftercare following surgery on the circulatory system: Secondary | ICD-10-CM

## 2013-08-05 ENCOUNTER — Telehealth: Payer: Self-pay | Admitting: Cardiology

## 2013-08-05 NOTE — Telephone Encounter (Signed)
This depends somewhat on the type of dental procedure and how much bleeding occurred and might be expected in the short term. Would generally recommend starting back within a week, sooner if OK'ed by her dentist.

## 2013-08-05 NOTE — Telephone Encounter (Signed)
Patient informed. 

## 2013-08-05 NOTE — Telephone Encounter (Signed)
[name] had dental procedure done on 08-03-13 and was told to stay off Plavix and ASA. Wants to know when she Can go back on her medications.

## 2013-08-08 ENCOUNTER — Inpatient Hospital Stay (HOSPITAL_COMMUNITY): Admission: RE | Admit: 2013-08-08 | Payer: Medicaid Other | Source: Ambulatory Visit

## 2013-08-08 ENCOUNTER — Other Ambulatory Visit (HOSPITAL_COMMUNITY): Payer: Medicaid Other

## 2013-08-08 ENCOUNTER — Ambulatory Visit: Payer: Medicaid Other | Admitting: Surgery

## 2013-11-07 ENCOUNTER — Ambulatory Visit (INDEPENDENT_AMBULATORY_CARE_PROVIDER_SITE_OTHER): Payer: Medicaid Other | Admitting: Cardiology

## 2013-11-07 ENCOUNTER — Encounter: Payer: Self-pay | Admitting: Cardiology

## 2013-11-07 VITALS — BP 131/70 | HR 62 | Ht 59.0 in | Wt 113.1 lb

## 2013-11-07 DIAGNOSIS — E785 Hyperlipidemia, unspecified: Secondary | ICD-10-CM

## 2013-11-07 DIAGNOSIS — I6529 Occlusion and stenosis of unspecified carotid artery: Secondary | ICD-10-CM

## 2013-11-07 DIAGNOSIS — I2510 Atherosclerotic heart disease of native coronary artery without angina pectoris: Secondary | ICD-10-CM

## 2013-11-07 DIAGNOSIS — I10 Essential (primary) hypertension: Secondary | ICD-10-CM

## 2013-11-07 NOTE — Assessment & Plan Note (Signed)
Keep followup with Dr. Brabham. 

## 2013-11-07 NOTE — Assessment & Plan Note (Signed)
No change to current regimen. 

## 2013-11-07 NOTE — Patient Instructions (Signed)

## 2013-11-07 NOTE — Progress Notes (Signed)
Clinical Summary Ms. Tulloch is a 63 y.o.female last seen in October 2014. She has been stable from a cardiac perspective, no active angina symptoms. She did undergo oral surgery back in January, was off DAPT at that time.  She continues to follow regularly with Dr. Woody Seller. Also has followup with vascular surgery in the near future with history of RAS and carotid artery disease.  ECG today shows sinus rhythm with LVH.  Stress echocardiogram from October 2013 was negative for ischemia.  Allergies  Allergen Reactions  . Celecoxib     REACTION: itching  . Codeine     REACTION: itching  . Dicyclomine     Made diarrhea worse  . Lamictal [Lamotrigine]   . Lyrica [Pregabalin]     REACTION: headaches,blurred vision  . Metoclopramide Hcl     Unknown  . Morphine And Related     Unknown  . Savella [Milnacipran Hcl]   . Trazodone And Nefazodone     hallucinations  . Venlafaxine     Affected eyes  . Citalopram Palpitations    Heart rate increased  . Sertraline Hcl Other (See Comments)    Stomach upset    Current Outpatient Prescriptions  Medication Sig Dispense Refill  . aspirin EC 81 MG tablet Take 1 tablet (81 mg total) by mouth daily.      . carisoprodol (SOMA) 350 MG tablet Take 350 mg by mouth 2 (two) times daily as needed.       . clopidogrel (PLAVIX) 75 MG tablet Take 75 mg by mouth daily.      . diazepam (VALIUM) 10 MG tablet Take 10 mg by mouth every 8 (eight) hours as needed. For anxiety      . folic acid (FOLVITE) 1 MG tablet Take 1 mg by mouth daily.        . Garlic (GARLIQUE) 151 MG TBEC Take 1 tablet by mouth daily.      . nitroGLYCERIN (NITROSTAT) 0.4 MG SL tablet Place 0.4 mg under the tongue every 5 (five) minutes as needed. For chest pain      . oxyCODONE-acetaminophen (PERCOCET) 10-325 MG per tablet Take 1 tablet by mouth every 6 (six) hours as needed. For pain      . pantoprazole (PROTONIX) 40 MG tablet Take 1 tablet (40 mg total) by mouth daily.  90 tablet  3    . potassium chloride (K-DUR) 10 MEQ tablet Take 10 mEq by mouth daily as needed. Use only when Potassium low. (Legs start cramping)      . promethazine (PHENERGAN) 25 MG tablet Take 25 mg by mouth every 6 (six) hours as needed for nausea.      . simvastatin (ZOCOR) 20 MG tablet Take 20 mg by mouth daily.      . TOPROL XL 25 MG 24 hr tablet TAKE ONE TABLET BY MOUTH ONCE DAILY  30 tablet  3  . triamterene-hydrochlorothiazide (MAXZIDE-25) 37.5-25 MG per tablet Take 0.5 tablets by mouth daily.      . Vitamin D, Ergocalciferol, (DRISDOL) 50000 UNITS CAPS Take 50,000 Units by mouth every 7 (seven) days. Take on mondays       No current facility-administered medications for this visit.    Past Medical History  Diagnosis Date  . Essential hypertension, benign   . GERD (gastroesophageal reflux disease)   . Depression   . Type 2 diabetes mellitus   . Anxiety   . Hiatal hernia   . NSTEMI (non-ST elevated myocardial infarction)  4/10  . Hyperlipidemia   . Carotid artery disease     Dr. Trula Slade, 60-79% RICA, s/p left CEA  . Fibromyalgia   . Coronary atherosclerosis of native coronary artery     DES RCA/circumflex 4/10, LVEF 65  . Arthritis     Rhumatoid and Osteoarthritis  . Renal artery stenosis     Left renal artery stent 12/13  . Restless leg syndrome   . Spondylosis without myelopathy     Social History Ms. Labarre reports that she quit smoking about 5 years ago. Her smoking use included Cigarettes. She has a 45 pack-year smoking history. She has never used smokeless tobacco. Ms. Owensby reports that she does not drink alcohol.  Review of Systems No fevers or chills, no cough. Otherwise negative except as outlined.  Physical Examination Filed Vitals:   11/07/13 1413  BP: 131/70  Pulse: 62   Filed Weights   11/07/13 1413  Weight: 113 lb 1.9 oz (51.311 kg)    No distress.  HEENT: Conjunctiva and lids normal, oropharynx clear.  Neck: Supple, no elevated JVP, bilateral  carotid bruits noted, right greater than left. Well-healed CEA scar on left.  Lungs: Clear to auscultation, diminished, nonlabored.  Cardiac: Regular rate and rhythm, no S3 or rub. Soft systolic murmur and S4.  Extremities: No pitting edema.    Problem List and Plan   CAD, NATIVE VESSEL Symptomatically stable on medical therapy. No change to current regimen. Stress testing within the last 2 years was negative. ECG reviewed today and stable.  Essential hypertension, benign No change to current regimen.  CAROTID OCCLUSIVE DISEASE Keep followup with Dr. Trula Slade.  DYSLIPIDEMIA Followed by Dr. Woody Seller, on Zocor.    Satira Sark, M.D., F.A.C.C.

## 2013-11-07 NOTE — Assessment & Plan Note (Signed)
Symptomatically stable on medical therapy. No change to current regimen. Stress testing within the last 2 years was negative. ECG reviewed today and stable.

## 2013-11-07 NOTE — Assessment & Plan Note (Signed)
Followed by Dr. Woody Seller, on Zocor.

## 2014-01-25 ENCOUNTER — Encounter (INDEPENDENT_AMBULATORY_CARE_PROVIDER_SITE_OTHER): Payer: Self-pay | Admitting: *Deleted

## 2014-01-27 ENCOUNTER — Encounter: Payer: Self-pay | Admitting: Family

## 2014-01-30 ENCOUNTER — Ambulatory Visit (INDEPENDENT_AMBULATORY_CARE_PROVIDER_SITE_OTHER)
Admission: RE | Admit: 2014-01-30 | Discharge: 2014-01-30 | Disposition: A | Discharge status: Home / Self Care | Payer: Medicare Other | Source: Ambulatory Visit | Attending: Family | Admitting: Family

## 2014-01-30 ENCOUNTER — Encounter: Payer: Self-pay | Admitting: Family

## 2014-01-30 ENCOUNTER — Ambulatory Visit (HOSPITAL_COMMUNITY)
Admission: RE | Admit: 2014-01-30 | Discharge: 2014-01-30 | Disposition: A | Discharge status: Home / Self Care | Payer: Medicare Other | Source: Ambulatory Visit | Attending: Family | Admitting: Family

## 2014-01-30 ENCOUNTER — Ambulatory Visit (INDEPENDENT_AMBULATORY_CARE_PROVIDER_SITE_OTHER): Payer: Medicare Other | Admitting: Family

## 2014-01-30 VITALS — BP 150/65 | HR 65 | Resp 16 | Ht 59.0 in | Wt 105.0 lb

## 2014-01-30 DIAGNOSIS — I701 Atherosclerosis of renal artery: Secondary | ICD-10-CM

## 2014-01-30 DIAGNOSIS — Z48812 Encounter for surgical aftercare following surgery on the circulatory system: Secondary | ICD-10-CM

## 2014-01-30 DIAGNOSIS — I6529 Occlusion and stenosis of unspecified carotid artery: Secondary | ICD-10-CM

## 2014-01-30 DIAGNOSIS — I658 Occlusion and stenosis of other precerebral arteries: Secondary | ICD-10-CM | POA: Diagnosis not present

## 2014-01-30 NOTE — Patient Instructions (Addendum)
Stroke Prevention Some medical conditions and behaviors are associated with an increased chance of having a stroke. You may prevent a stroke by making healthy choices and managing medical conditions. HOW CAN I REDUCE MY RISK OF HAVING A STROKE?   Stay physically active. Get at least 30 minutes of activity on most or all days.  Do not smoke. It may also be helpful to avoid exposure to secondhand smoke.  Limit alcohol use. Moderate alcohol use is considered to be:  No more than 2 drinks per day for men.  No more than 1 drink per day for nonpregnant women.  Eat healthy foods. This involves  Eating 5 or more servings of fruits and vegetables a day.  Following a diet that addresses high blood pressure (hypertension), high cholesterol, diabetes, or obesity.  Manage your cholesterol levels.  A diet low in saturated fat, trans fat, and cholesterol and high in fiber may control cholesterol levels.  Take any prescribed medicines to control cholesterol as directed by your health care provider.  Manage your diabetes.  A controlled-carbohydrate, controlled-sugar diet is recommended to manage diabetes.  Take any prescribed medicines to control diabetes as directed by your health care provider.  Control your hypertension.  A low-salt (sodium), low-saturated fat, low-trans fat, and low-cholesterol diet is recommended to manage hypertension.  Take any prescribed medicines to control hypertension as directed by your health care provider.  Maintain a healthy weight.  A reduced-calorie, low-sodium, low-saturated fat, low-trans fat, low-cholesterol diet is recommended to manage weight.  Stop drug abuse.  Avoid taking birth control pills.  Talk to your health care provider about the risks of taking birth control pills if you are over 35 years old, smoke, get migraines, or have ever had a blood clot.  Get evaluated for sleep disorders (sleep apnea).  Talk to your health care provider about  getting a sleep evaluation if you snore a lot or have excessive sleepiness.  Take medicines as directed by your health care provider.  For some people, aspirin or blood thinners (anticoagulants) are helpful in reducing the risk of forming abnormal blood clots that can lead to stroke. If you have the irregular heart rhythm of atrial fibrillation, you should be on a blood thinner unless there is a good reason you cannot take them.  Understand all your medicine instructions.  Make sure that other other conditions (such as anemia or atherosclerosis) are addressed. SEEK IMMEDIATE MEDICAL CARE IF:   You have sudden weakness or numbness of the face, arm, or leg, especially on one side of the body.  Your face or eyelid droops to one side.  You have sudden confusion.  You have trouble speaking (aphasia) or understanding.  You have sudden trouble seeing in one or both eyes.  You have sudden trouble walking.  You have dizziness.  You have a loss of balance or coordination.  You have a sudden, severe headache with no known cause.  You have new chest pain or an irregular heartbeat. Any of these symptoms may represent a serious problem that is an emergency. Do not wait to see if the symptoms will go away. Get medical help at once. Call your local emergency services  (911 in [place] Do not drive yourself to the hospital. Document Released: 08/07/2004 Document Revised: 04/20/2013 Document Reviewed: 12/31/2012 [employer] Patient Information 2015 [employer]. This information is not intended to replace advice given to you by your health care provider. Make sure you discuss any questions you have with your health   care provider.   Renal Artery Stenosis Renal artery stenosis (RAS) is the narrowing of the artery that supplies blood to the kidney. If the narrowing is critical and the kidney does not get enough blood, hypertension (high blood pressure) can develop. This is called renal vascular  hypertension (RVH). This is a common, uncommon cause of secondary hypertension. It does not usually happen until there is at least a 70% narrowing of the artery. Decreased blood flow through the renal artery causes the kidney to release increased amounts of a hormone. It is called renin. Renin is a strong blood pressure regulator. When it is high, it causes changes that lead to hypertension. Eventually the kidney not receiving enough blood may shrink in size and become less useful. The high blood pressure that is produced can eventually damage and destroy the remaining kidney. This is called hypertensive nephrosclerosis. If both kidneys fail, it will lead to chronic renal failure.  CAUSES  Most renal artery stenosis is caused by a hardening of the arteries (atherosclerosis). This is called Atherosclerotic Renal Artery Stenosis (AS-RAS). It is caused by a build-up of cholesterol (plaques) on the inner lining of the renal artery. A much less common cause is Fibromuscular Dysplasia (FMD). With it, there is an abnormality in the muscular lining of the renal artery. FMD-RAS occurs almost exclusively in women aged 26 to 64. It rarely affects African Americans or Asians.  SYMPTOMS  Often high blood pressure is discovered on a routine blood pressure check. It may be the only sign that something is wrong. Other problems that may occur are:  You may develop calf pain when walking. This is called intermittent claudication. It may be a sign of bad circulation in the legs.  Inability to use certain blood pressure pills such as angiotensin-I (ACE-I) inhibitors or angiotensin receptor blockers (ARB's). These could cause sudden drops in blood pressure with worsening of kidney function.  More than three antihypertensive medications may be needed for blood pressure control.  New onset of high blood pressure if you are over 55. DIAGNOSIS  Your caregiver may find suggestions of this on exam if he finds bruits (like  murmurs) on listening to your abdomen (belly) or the large arteries in your neck. Your caregiver may also suspect this there is a sudden worsening of your blood pressure when it has been well controlled and you are over age 38. Additional testing that may be done includes:  One diagnostic method used for renal artery stenosis (RAS) is to measure and compare the level of renin, (blood pressure-regulating hormone released by the kidneys), in the right to the left renal veins. If the amount of renin released by one-side is markedly higher than the other, this identifies a high renin-releasing kidney consistent with RAS.  FMD-RAS is often found on renal scan with ACE-inhibitor challenge, or ultrasound with Doppler.  FMD responds well to angioplasty and stenting. The results of stenting in FMD are usually long lasting. RISK FACTORS  Most renal artery stenosis is caused by a hardening of the arteries. This is called atherosclerosis. Other risk factors associated with the development of atherosclerotic RAS include the following:   Carotid artery disease.  Obesity.  High blood pressure.  Heredity.  Old age.  Fibromuscular dysplasia.  Diabetes mellitus.  Smoking.  Hardening of the arteries. TREATMENT   Renal vascular hypertension can be very severe. It can also be difficult to control.  Medication is used to control high blood pressure (hypertension). Blood pressure medications that directly  affect the renin angiotensin pathway can be used toe help control blood pressure. ACE inhibitors and angiotensin receptor blockers (ARB's) are often effective in patients with unilateral RAS. In some cases, patients with RAS are resistant to these medications.  In patients with bilateral RAS, these medications must be used carefully. They may cause acute renal failure (ARF). If acute renal failure develops (if creatinine increases by more than 30%), the medication is discontinued. The patient is evaluated  for bilateral RAS.  Angioplasty and stenting may be used to improve blood flow. The goal is to improve the circulation of blood flow to the kidney and prevent the release of excess renin, which can help to decrease blood pressure. This helps to prevent atrophy of the kidney. In general, patients with AS-RAS should have stenting done. This is because plasty by itself has a high incidence of re-stenosis.  Surgery to bypass the narrowing may be done. If the kidney with RAS has diminished in size or strength (atrophied ), surgical removal of the kidney may be advised. This is called nephrectomy. Document Released: 03/26/2005 Document Revised: 09/22/2011 Document Reviewed: 06/29/2008 [facility] Patient Information 2015 [place], [place]. This information is not intended to replace advice given to you by your health care provider. Make sure you discuss any questions you have with your health care provider.   Smoking Cessation Quitting smoking is important to your health and has many advantages. However, it is not always easy to quit since nicotine is a very addictive drug. Often times, people try 3 times or more before being able to quit. This document explains the best ways for you to prepare to quit smoking. Quitting takes hard work and a lot of effort, but you can do it. ADVANTAGES OF QUITTING SMOKING  You will live longer, feel better, and live better.  Your body will feel the impact of quitting smoking almost immediately.  Within 20 minutes, blood pressure decreases. Your pulse returns to its normal level.  After 8 hours, carbon monoxide levels in the blood return to normal. Your oxygen level increases.  After 24 hours, the chance of having a heart attack starts to decrease. Your breath, hair, and body stop smelling like smoke.  After 48 hours, damaged nerve endings begin to recover. Your sense of taste and smell improve.  After 72 hours, the body is virtually free of nicotine. Your bronchial  tubes relax and breathing becomes easier.  After 2 to 12 weeks, lungs can hold more air. Exercise becomes easier and circulation improves.  The risk of having a heart attack, stroke, cancer, or lung disease is greatly reduced.  After 1 year, the risk of coronary heart disease is cut in half.  After 5 years, the risk of stroke falls to the same as a nonsmoker.  After 10 years, the risk of lung cancer is cut in half and the risk of other cancers decreases significantly.  After 15 years, the risk of coronary heart disease drops, usually to the level of a nonsmoker.  If you are pregnant, quitting smoking will improve your chances of having a healthy baby.  The people you live with, especially any children, will be healthier.  You will have extra money to spend on things other than cigarettes. QUESTIONS TO THINK ABOUT BEFORE ATTEMPTING TO QUIT You may want to talk about your answers with your caregiver.  Why do you want to quit?  If you tried to quit in the past, what helped and what did not?  What will  be the most difficult situations for you after you quit? How will you plan to handle them?  Who can help you through the tough times? Your family? Friends? A caregiver?  What pleasures do you get from smoking? What ways can you still get pleasure if you quit? Here are some questions to ask your caregiver:  How can you help me to be successful at quitting?  What medicine do you think would be best for me and how should I take it?  What should I do if I need more help?  What is smoking withdrawal like? How can I get information on withdrawal? GET READY  Set a quit date.  Change your environment by getting rid of all cigarettes, ashtrays, matches, and lighters in your home, car, or work. Do not let people smoke in your home.  Review your past attempts to quit. Think about what worked and what did not. GET SUPPORT AND ENCOURAGEMENT You have a better chance of being successful if  you have help. You can get support in many ways.  Tell your family, friends, and co-workers that you are going to quit and need their support. Ask them not to smoke around you.  Get individual, group, or telephone counseling and support. Programs are available at [employer] and health centers. Call your local health department for information about programs in your area.  Spiritual beliefs and practices may help some smokers quit.  Download a "quit meter" on your computer to keep track of quit statistics, such as how long you have gone without smoking, cigarettes not smoked, and money saved.  Get a self-help book about quitting smoking and staying off of tobacco. [place] yourself from urges to smoke. Talk to someone, go for a walk, or occupy your time with a task.  Change your normal routine. Take a different route to work. Drink tea instead of coffee. Eat breakfast in a different place.  Reduce your stress. Take a hot bath, exercise, or read a book.  Plan something enjoyable to do every day. Reward yourself for not smoking.  Explore interactive web-based programs that specialize in helping you quit. GET MEDICINE AND USE IT CORRECTLY Medicines can help you stop smoking and decrease the urge to smoke. Combining medicine with the above behavioral methods and support can greatly increase your chances of successfully quitting smoking.  Nicotine replacement therapy helps deliver nicotine to your body without the negative effects and risks of smoking. Nicotine replacement therapy includes nicotine gum, lozenges, inhalers, nasal sprays, and skin patches. Some may be available over-the-counter and others require a prescription.  Antidepressant medicine helps people abstain from smoking, but how this works is unknown. This medicine is available by prescription.  Nicotinic receptor partial agonist medicine simulates the effect of nicotine in your brain. This  medicine is available by prescription. Ask your caregiver for advice about which medicines to use and how to use them based on your health history. Your caregiver will tell you what side effects to look out for if you choose to be on a medicine or therapy. Carefully read the information on the package. Do not use any other product containing nicotine while using a nicotine replacement product.  RELAPSE OR DIFFICULT SITUATIONS Most relapses occur within the first 3 months after quitting. Do not be discouraged if you start smoking again. Remember, most people try several times before finally quitting. You may have symptoms of withdrawal because your body is used to nicotine.  You may crave cigarettes, be irritable, feel very hungry, cough often, get headaches, or have difficulty concentrating. The withdrawal symptoms are only temporary. They are strongest when you first quit, but they will go away within 10-14 days. To reduce the chances of relapse, try to:  Avoid drinking alcohol. Drinking lowers your chances of successfully quitting.  Reduce the amount of caffeine you consume. Once you quit smoking, the amount of caffeine in your body increases and can give you symptoms, such as a rapid heartbeat, sweating, and anxiety.  Avoid smokers because they can make you want to smoke.  Do not let weight gain distract you. Many smokers will gain weight when they quit, usually less than 10 pounds. Eat a healthy diet and stay active. You can always lose the weight gained after you quit.  Find ways to improve your mood other than smoking. FOR MORE INFORMATION  www.smokefree.gov  Document Released: 06/24/2001 Document Revised: 12/30/2011 Document Reviewed: 10/09/2011 [facility] Patient Information 2015 [place], [place]. This information is not intended to replace advice given to you by your health care provider. Make sure you discuss any questions you have with your health care provider.

## 2014-01-30 NOTE — Progress Notes (Signed)
Established Carotid Patient   History of Present Illness  [name] is a 63 y.o. female patient of Dr. Trula Slade who returns today for followup of her carotid occlusive disease as well as her renal artery stenosis. She is s/p left carotid endarterectomy on 02/28/2009 and renal artery stenting in December of 2013.  6 months ago her carotid ultrasound showed a high-grade right carotid artery stenosis approaching 80%. Her left endarterectomy site was widely patent.  She had a stent placed in her left renal artery for a high-grade stenosis. Is seeing a gastroenterologist in [place] for diarrhea and rectal bleeding. She has fibromyalgia, does not seem like she has claudication symptoms, no worse with walking, denies non healing wounds. She has lost weight, denies post prandial abdominal pain.  Patient has Negative history of TIA or stroke symptom.  The patient denies amaurosis fugax or monocular blindness.  The patient  denies facial drooping.  Pt. denies hemiplegia.  The patient denies receptive or expressive aphasia.    Pt  reports New Medical or Surgical History: problems with diarrhea, GI bleeding, staggering. She also c/o left ear soreness for about a week.   Pt Diabetic: No, states she was told by her PCP that she is no longer a diabetic  Pt smoker: smoker  (1/3 ppd, started at age 36 yrs)  Pt meds include: Statin : Yes ASA: Yes, advised to reduce to qod until she consults her cardiologist, Other anticoagulants/antiplatelets: she stopped the Plavix about 2 weeks ago, when she found she was GI bleeding   Past Medical History  Diagnosis Date  . Essential hypertension, benign   . GERD (gastroesophageal reflux disease)   . Depression   . Type 2 diabetes mellitus   . Anxiety   . Hiatal hernia   . NSTEMI (non-ST elevated myocardial infarction)     4/10  . Hyperlipidemia   . Carotid artery disease     Dr. Trula Slade, 60-79% RICA, s/p left CEA  . Fibromyalgia   . Coronary  atherosclerosis of native coronary artery     DES RCA/circumflex 4/10, LVEF 65  . Arthritis     Rhumatoid and Osteoarthritis  . Renal artery stenosis     Left renal artery stent 12/13  . Restless leg syndrome   . Spondylosis without myelopathy     Social History History  Substance Use Topics  . Smoking status: Former Smoker -- 1.00 packs/day for 45 years    Types: Cigarettes    Quit date: 10/12/2008  . Smokeless tobacco: Never Used     Comment: quit in 2012  . Alcohol Use: No    Family History Family History  Problem Relation Age of Onset  . Other Mother     Cerebral hemorrage  . Heart disease Mother   . Heart attack Mother   . Hyperlipidemia Mother   . Heart disease Father   . Heart attack Father   . Hyperlipidemia Father   . Heart disease Brother     Heart Disease before age 67  . Diabetes Brother   . Hypertension Brother   . Heart attack Brother   . Cancer Sister   . Arthritis Sister   . Heart disease Sister   . Heart disease Daughter 63    Heart Disease before age 24  . Heart attack Daughter   . Hypertension Daughter   . Cancer Sister   . Arthritis Sister   . Diabetes Brother     Varicose  Veins  . Peripheral vascular  disease Brother   . Diabetes Brother     Surgical History Past Surgical History  Procedure Laterality Date  . Tubal ligation    . Breast lumpectomy    . Lumbar disc surgery      L5-S1  . Total knee arthroplasty      Left  . Neck fusion  1996    C4 to C6  . Spine surgery  1994    Ruptured disc  . Joint replacement  March 2013    Left knee  . Joint replacement  2006    Left Knee  . Tonsilectomy, adenoidectomy, bilateral myringotomy and tubes  1969  . Abdominal hysterectomy  1977  . Cholecystectomy  1991  . Colonoscopy    . Eye surgery      Catartact bilateral  . Carotid endarterectomy Left 02/28/2009  . Kidney stent      Left December 2013  . Esophagogastroduodenoscopy N/A 04/07/2013    Procedure: ESOPHAGOGASTRODUODENOSCOPY  (EGD);  Surgeon: Rogene Houston, MD;  Location: [facility];  Service: Endoscopy;  Laterality: N/A;  250  . Multiple tooth extractions  Jan. 2015    Allergies  Allergen Reactions  . Celecoxib     REACTION: itching  . Codeine     REACTION: itching  . Dicyclomine     Made diarrhea worse  . Lamictal [Lamotrigine]   . Lyrica [Pregabalin]     REACTION: headaches,blurred vision  . Metoclopramide Hcl     Unknown  . Morphine And Related     Unknown  . Savella [Milnacipran Hcl]   . Trazodone And Nefazodone     hallucinations  . Venlafaxine     Affected eyes  . Citalopram Palpitations    Heart rate increased  . Sertraline Hcl Other (See Comments)    Stomach upset    Current Outpatient Prescriptions  Medication Sig Dispense Refill  . aspirin EC 81 MG tablet Take 1 tablet (81 mg total) by mouth daily.      . carisoprodol (SOMA) 350 MG tablet Take 350 mg by mouth 2 (two) times daily as needed.       . clopidogrel (PLAVIX) 75 MG tablet Take 75 mg by mouth daily.      . diazepam (VALIUM) 10 MG tablet Take 10 mg by mouth every 8 (eight) hours as needed. For anxiety      . folic acid (FOLVITE) 1 MG tablet Take 1 mg by mouth daily.        . Garlic (GARLIQUE) 517 MG TBEC Take 1 tablet by mouth daily.      . nitroGLYCERIN (NITROSTAT) 0.4 MG SL tablet Place 0.4 mg under the tongue every 5 (five) minutes as needed. For chest pain      . oxyCODONE-acetaminophen (PERCOCET) 10-325 MG per tablet Take 1 tablet by mouth every 6 (six) hours as needed. For pain      . pantoprazole (PROTONIX) 40 MG tablet Take 1 tablet (40 mg total) by mouth daily.  90 tablet  3  . potassium chloride (K-DUR) 10 MEQ tablet Take 10 mEq by mouth daily as needed. Use only when Potassium low. (Legs start cramping)      . promethazine (PHENERGAN) 25 MG tablet Take 25 mg by mouth every 6 (six) hours as needed for nausea.      . simvastatin (ZOCOR) 20 MG tablet Take 20 mg by mouth daily.      . TOPROL XL 25 MG 24 hr tablet  TAKE ONE TABLET BY MOUTH  ONCE DAILY  30 tablet  3  . triamterene-hydrochlorothiazide (MAXZIDE-25) 37.5-25 MG per tablet Take 0.5 tablets by mouth daily.      . Vitamin D, Ergocalciferol, (DRISDOL) 50000 UNITS CAPS Take 50,000 Units by mouth every 7 (seven) days. Take on mondays       No current facility-administered medications for this visit.    Review of Systems : See HPI for pertinent positives and negatives.  Physical Examination   Filed Vitals:   01/30/14 1044 01/30/14 1046  BP: 157/65 150/65  Pulse: 66 65  Resp:  16  Height:  4\' 11"  (1.499 m)  Weight:  105 lb (47.628 kg)  SpO2:  98%   Body mass index is 21.2 kg/(m^2).  General: WDWN female in NAD GAIT: normal Eyes: PERRLA Pulmonary:  Non-labored, CTAB, decreased air movement in all fields,  Negative  Rales, Negative rhonchi, & Negative wheezing.  Cardiac: regular Rhythm ,  Negative detected murmur.  VASCULAR EXAM Carotid Bruits Left Right   Positive Positive    Aorta is not palpable. Radial pulses are 2+ palpable and equal.                                                                                                                            LE Pulses LEFT RIGHT       FEMORAL  2+ palpable  2+ palpable        POPLITEAL  1+ palpable   1+ palpable       POSTERIOR TIBIAL  not palpable   not palpable        DORSALIS PEDIS      ANTERIOR TIBIAL 2+ palpable  2+ palpable     Gastrointestinal: soft, nontender, BS WNL, no r/g,  negative masses.  Musculoskeletal: Negative muscle atrophy/wasting. M/S 4/5 throughout, Extremities without ischemic changes. Tender to palpation at lower legs.   Neurologic: A&O X 3; Appropriate Affect ; SENSATION ;normal;  Speech is normal CN 2-12 intact, Motor exam as listed above.   Non-Invasive Vascular Imaging CAROTID DUPLEX 01/30/2014   CEREBROVASCULAR DUPLEX EVALUATION    INDICATION: Carotid endarterectomy     PREVIOUS INTERVENTION(S): Left carotid endarterectomy on  02/28/09    DUPLEX EXAM:     RIGHT  LEFT  Peak Systolic Velocities (cm/s) End Diastolic Velocities (cm/s) Plaque LOCATION Peak Systolic Velocities (cm/s) End Diastolic Velocities (cm/s) Plaque  102 16 HT CCA PROXIMAL 87 18   108 20 HT CCA MID 103 21 HT  103 22 HT CCA DISTAL 86 17   310 13 HT ECA 119 6 HT  365 52 HT ICA PROXIMAL 108 29   129 25  ICA MID 112 31   88 21  ICA DISTAL 113 31     3.5 ICA / CCA Ratio (PSV) Not Calculated  Antegrade Vertebral Flow Antegrade  161 Brachial Systolic Pressure (mmHg) 096  Multiphasic (subclavian artery) Brachial Artery Waveforms Multiphasic (subclavian artery)    Plaque Morphology:  HM = Homogeneous,  HT = Heterogeneous, CP = Calcific Plaque, SP = Smooth Plaque, IP = Irregular Plaque     ADDITIONAL FINDINGS:   No significant stenosis of the left external or bilateral common carotid arteries.   Right external carotid artery stenosis noted.    IMPRESSION: 1. Patent left carotid endarterectomy site with no left internal carotid artery stenosis. 2. Evidence of a 60-79% stenosis of the right proximal internal carotid artery based on Doppler velocities, ICA/CCA ratio and plaque formation.    Compared to the previous exam:  No significant change in the bilateral internal carotid artery velocities noted when compared to the previous exam on 01/11/13.    RENAL ARTERY DUPLEX EVALUATION    INDICATION: Renal artery stent    PREVIOUS INTERVENTION(S): Left renal artery stent on 06/15/12    DUPLEX EXAM:     AORTA Peak Systolic Velocity (cm/s): 97    RIGHT  LEFT   Peak Systolic Velocities (cm/s) Comments  Peak Systolic Velocities (cm/s) Comments  277  Renal Artery Origin/Proximal 242   184  Renal Artery Mid Not Visualized    152  Renal Artery Distal 69     Accessory Renal Artery (when present)    2.86  Renal / Aortic Ratio (RAR) 2.49  9.5 Kidney Size (cm) 9.7  Patent  Renal Vein Patent    ADDITIONAL FINDINGS:   Limited visualization of the abdominal  vasculature and renal stent due to overlying bowel gas patterns.   The bilateral kidney length measurements are within normal limits.   Mildly elevated velocities of the bilateral proximal renal arteries however the renal aortic ratios do not suggest greater than 60% stenoses.    IMPRESSION: The bilateral renal-aortic ratios suggest less than 60% stenoses of the bilateral proximal renal arteries, based on limited visualization, as described above.    Compared to the previous exam:  Previous CT angiogram on 01/24/13 demonstrated luminal narrowing approaching 50% in the bilateral renal arteries.      Assessment: STORMY CONNON is a 63 y.o. female who is s/p left carotid endarterectomy on 02/28/2009 and renal artery stenting in December of 2013.  She presents with asymptomatic patent left carotid endarterectomy site with no left internal carotid artery stenosis, evidence of a 60-79% stenosis of the right proximal internal carotid artery based on Doppler velocities, ICA/CCA ratio and plaque formation. No significant change in the bilateral internal carotid artery velocities noted when compared to the previous exam on 01/11/13.  The bilateral renal-aortic ratios suggest less than 60% stenoses of the bilateral proximal renal arteries, based on limited visualization. Previous CT angiogram on 01/24/13 demonstrated luminal narrowing approaching 50% in the bilateral renal arteries. Her blood pressure remains under good control.  Will defer to Dr. Domenic Polite to advise pt on continuance of ASA and/or Plavix in the background of her diagnosis 2 weeks ago of GI bleed. Advised pt to remain off of Plavix and to reduce ASA 81 mg to every other day until she hears from Dr. Myles Gip office.   Plan: Follow-up in 6 months with Carotid Duplex scan and bilateral renal artery Duplex.  Pt was counseled re smoking cessation.   I discussed in depth with the patient the nature of atherosclerosis, and emphasized the  importance of maximal medical management including strict control of blood pressure, blood glucose, and lipid levels, obtaining regular exercise, and cessation of smoking.  The patient is aware that without maximal medical management the underlying atherosclerotic disease process will progress, limiting the benefit of any interventions. The  patient was given information about stroke prevention and what symptoms should prompt the patient to seek immediate medical care. Thank you for allowing [place] to participate in this patient's care.  Clemon Chambers, RN, MSN, FNP-C [facility] Office: [phone]  Clinic Physician: Oneida Alar on call  01/30/2014 11:04 AM

## 2014-01-31 ENCOUNTER — Encounter (INDEPENDENT_AMBULATORY_CARE_PROVIDER_SITE_OTHER): Payer: Self-pay | Admitting: *Deleted

## 2014-01-31 ENCOUNTER — Telehealth: Payer: Self-pay | Admitting: *Deleted

## 2014-01-31 ENCOUNTER — Encounter (INDEPENDENT_AMBULATORY_CARE_PROVIDER_SITE_OTHER): Payer: Self-pay | Admitting: Internal Medicine

## 2014-01-31 ENCOUNTER — Other Ambulatory Visit (INDEPENDENT_AMBULATORY_CARE_PROVIDER_SITE_OTHER): Payer: Self-pay | Admitting: *Deleted

## 2014-01-31 ENCOUNTER — Ambulatory Visit (INDEPENDENT_AMBULATORY_CARE_PROVIDER_SITE_OTHER): Payer: Medicare Other | Admitting: Internal Medicine

## 2014-01-31 VITALS — BP 98/42 | HR 64 | Temp 98.7°F | Ht 59.0 in | Wt 105.2 lb

## 2014-01-31 DIAGNOSIS — K921 Melena: Secondary | ICD-10-CM

## 2014-01-31 DIAGNOSIS — I2510 Atherosclerotic heart disease of native coronary artery without angina pectoris: Secondary | ICD-10-CM

## 2014-01-31 LAB — CBC WITH DIFFERENTIAL/PLATELET
BASOS ABS: 0 10*3/uL (ref 0.0–0.1)
Basophils Relative: 0 % (ref 0–1)
EOS ABS: 0.1 10*3/uL (ref 0.0–0.7)
Eosinophils Relative: 1 % (ref 0–5)
HEMATOCRIT: 36.9 % (ref 36.0–46.0)
HEMOGLOBIN: 13.1 g/dL (ref 12.0–15.0)
Lymphocytes Relative: 26 % (ref 12–46)
Lymphs Abs: 1.5 10*3/uL (ref 0.7–4.0)
MCH: 32.1 pg (ref 26.0–34.0)
MCHC: 35.5 g/dL (ref 30.0–36.0)
MCV: 90.4 fL (ref 78.0–100.0)
MONOS PCT: 7 % (ref 3–12)
Monocytes Absolute: 0.4 10*3/uL (ref 0.1–1.0)
Neutro Abs: 3.9 10*3/uL (ref 1.7–7.7)
Neutrophils Relative %: 66 % (ref 43–77)
Platelets: 272 10*3/uL (ref 150–400)
RBC: 4.08 MIL/uL (ref 3.87–5.11)
RDW: 12.4 % (ref 11.5–15.5)
WBC: 5.9 10*3/uL (ref 4.0–10.5)

## 2014-01-31 NOTE — Progress Notes (Signed)
Subjective:     Patient ID: [name], female   DOB: 06-23-1951, 63 y.o.   MRN: [id number]  HPI Referred to our office by Dr. Woody Seller for blood stools cards in June. She says stool looked normal. She tells me she started having diarrhea the 1st of July. Seen at PCP and given Phenergan and a diarrheal pill. She was having 7 stools a day. Stools were loose and watery.  She says her stool looked black. The diarrhea lasted for about a week and then resolved. There was no fever. She says she was weak and had a headache. No stool studies. She thinks she may have lost about 5 pounds.  Since then her BMs have been normal. Plavix for stent heart, kidney for stenosis.  [facility] [place]. Told to hold Plavix and ASA 81mg  every other day. (Per notes from [place] Nickel PA with Dr. Trula Slade 01/30/2014 she stopped Plavix 2 weeks ago) Appetite is good and sometimes it is not good. She has frequent nausea with diarrhea.  No BRRB. Occasionally has epigastric pain and lower abdominal pain. No family hx of colon cancer   03/17/2010 Colonoscopy under fluoroscopy:  No evidence of residual polyp. Mild changes of melanosis coli and small external hemorrhoids.   02/15/2010 Colonoscopy which was incomplete hepatic flexure: Very redundant colon limiting exam of the hepatic flexure. Two polyps at transverse colon, both of which were small with difficult approach.  Biopsied for histology and ablated with argon plasma coagulator.  Biopsy: proximal transverse colon polyp: tubular adenoma with moderate atypia. Transverse colon polyp: Tubular adenoma.   04/07/2013 EGD: postprandial bloating, abdominal pain,painful defecation, Gastric emptying study recently abnormal.  Impression: Non erosive antral gastritis, otherwise normal EGD.  12/17/2013  H and H 13.7 and 38.1, MCV 93.1. Platelet 192.    Review of Systems Past Medical History  Diagnosis Date  . Essential hypertension, benign   . GERD  (gastroesophageal reflux disease)   . Depression   . Type 2 diabetes mellitus   . Anxiety   . Hiatal hernia   . NSTEMI (non-ST elevated myocardial infarction)     4/10  . Hyperlipidemia   . Carotid artery disease     Dr. Trula Slade, 60-79% RICA, s/p left CEA  . Fibromyalgia   . Coronary atherosclerosis of native coronary artery     DES RCA/circumflex 4/10, LVEF 65  . Arthritis     Rhumatoid and Osteoarthritis  . Renal artery stenosis     Left renal artery stent 12/13  . Restless leg syndrome   . Spondylosis without myelopathy     Past Surgical History  Procedure Laterality Date  . Tubal ligation    . Breast lumpectomy    . Lumbar disc surgery      L5-S1  . Total knee arthroplasty      Left  . Neck fusion  1996    C4 to C6  . Spine surgery  1994    Ruptured disc  . Joint replacement  March 2013    Left knee  . Joint replacement  2006    Left Knee  . Tonsilectomy, adenoidectomy, bilateral myringotomy and tubes  1969  . Abdominal hysterectomy  1977  . Cholecystectomy  1991  . Colonoscopy    . Eye surgery      Catartact bilateral  . Carotid endarterectomy Left 02/28/2009  . Kidney stent      Left December 2013  . Esophagogastroduodenoscopy N/A 04/07/2013    Procedure:  ESOPHAGOGASTRODUODENOSCOPY (EGD);  Surgeon: Rogene Houston, MD;  Location: [facility];  Service: Endoscopy;  Laterality: N/A;  250  . Multiple tooth extractions  Jan. 2015    Allergies  Allergen Reactions  . Celecoxib     REACTION: itching  . Codeine     REACTION: itching  . Dicyclomine     Made diarrhea worse  . Lamictal [Lamotrigine]   . Lyrica [Pregabalin]     REACTION: headaches,blurred vision  . Metoclopramide Hcl     Unknown  . Morphine And Related     Unknown  . Savella [Milnacipran Hcl]   . Trazodone And Nefazodone     hallucinations  . Venlafaxine     Affected eyes  . Citalopram Palpitations    Heart rate increased  . Sertraline Hcl Other (See Comments)    Stomach upset     Current Outpatient Prescriptions on File Prior to Visit  Medication Sig Dispense Refill  . carisoprodol (SOMA) 350 MG tablet Take 350 mg by mouth 2 (two) times daily as needed.       . clopidogrel (PLAVIX) 75 MG tablet Take 75 mg by mouth daily. Plavix is on hold at present since 7.19/2015.      . diazepam (VALIUM) 10 MG tablet Take 10 mg by mouth every 8 (eight) hours as needed. For anxiety      . folic acid (FOLVITE) 1 MG tablet Take 1 mg by mouth daily.        . nitroGLYCERIN (NITROSTAT) 0.4 MG SL tablet Place 0.4 mg under the tongue every 5 (five) minutes as needed. For chest pain      . oxyCODONE-acetaminophen (PERCOCET) 10-325 MG per tablet Take 1 tablet by mouth every 6 (six) hours as needed. For pain      . pantoprazole (PROTONIX) 40 MG tablet Take 1 tablet (40 mg total) by mouth daily.  90 tablet  3  . potassium chloride (K-DUR) 10 MEQ tablet Take 10 mEq by mouth daily as needed. Use only when Potassium low. (Legs start cramping)      . simvastatin (ZOCOR) 20 MG tablet Take 20 mg by mouth daily.      . TOPROL XL 25 MG 24 hr tablet TAKE ONE TABLET BY MOUTH ONCE DAILY  30 tablet  3  . triamterene-hydrochlorothiazide (MAXZIDE-25) 37.5-25 MG per tablet Take 0.5 tablets by mouth daily.      . Vitamin D, Ergocalciferol, (DRISDOL) 50000 UNITS CAPS Take 50,000 Units by mouth every 7 (seven) days. Take on mondays       No current facility-administered medications on file prior to visit.        Objective:   Physical Exam  Filed Vitals:   01/31/14 1518  BP: 98/42  Pulse: 64  Temp: 98.7 F (37.1 C)  Height: 4\' 11"  (1.499 m)  Weight: 105 lb 3.2 oz (47.718 kg)   Alert and oriented. Skin warm and dry. Oral mucosa is moist.   . Sclera anicteric, conjunctivae is pink. Thyroid not enlarged. No cervical lymphadenopathy. Lungs clear. Heart regular rate and rhythm.  Abdomen is soft. Bowel sounds are positive. No hepatomegaly. No abdominal masses felt. No tenderness.  No edema to lower  extremities. Stool brown and guaiac negative.       Assessment:    Epigastric pain with recent hx of melena. Chronic Plavix therapy which is on hold at present.  PUD needs to be ruled out.  Hx of colonic polyps. Due for colonoscopy in 2016.  Plan:    EGD.The risks and benefits such as perforation, bleeding, and infection were reviewed with the patient and is agreeable. CBC today

## 2014-01-31 NOTE — Telephone Encounter (Signed)
Message copied by Merlene Laughter on Tue Jan 31, 2014  3:03 PM ------      Message from: MCDOWELL, Aloha Gell      Created: Mon Jan 30, 2014  9:45 PM      Regarding: RE: whether pt should continue Plavix and or 81 mg ASA, seeing Dr. Corbin Ade for GI bleed       Thank you for the update. I do not see anything in EPIC about recent GIB. She has seen Dr. Laural Golden in the past. Will forward this note and ask nursing to check on this - speak with patient and Dr. Olevia Perches office if necessary. Can stop Plavix at this time particularly if recent GIB. ASA could be held as well if needed and recommended by GI, otherwise will plan to stay on ASA 81 mg daily.            ----- Message -----         From: Viann Fish, NP         Sent: 01/30/2014  11:23 AM           To: Satira Sark, MD      Subject: whether pt should continue Plavix and or 81 #            Good morning Dr. Domenic Polite,            I am deferring to you to advise Ms. Gonet whether or not and what doses to continue Plavix and/or 81 mg ASA.       She was diagnosed 2 weeks ago with GI bleed, dark blood, somewhat of an occult nature it seems, but also seems to be worsening.            Thank you,      [place] Nickel, NP-C       With [facility]       ------

## 2014-01-31 NOTE — Telephone Encounter (Signed)
Patient informed and said she saw Dr. Laural Golden today. Patient is currently holding plavix and aspirin pending endoscopy procedure.

## 2014-01-31 NOTE — Patient Instructions (Signed)
EGD. The risks and benefits such as perforation, bleeding, and infection were reviewed with the patient and is agreeable. 

## 2014-02-01 ENCOUNTER — Encounter (HOSPITAL_COMMUNITY): Payer: Self-pay | Admitting: Pharmacy Technician

## 2014-02-02 ENCOUNTER — Encounter (HOSPITAL_COMMUNITY)
Admission: RE | Disposition: A | Discharge status: Home / Self Care | Payer: Self-pay | Source: Ambulatory Visit | Attending: Internal Medicine

## 2014-02-02 ENCOUNTER — Ambulatory Visit (HOSPITAL_COMMUNITY)
Admission: RE | Admit: 2014-02-02 | Discharge: 2014-02-02 | Disposition: A | Discharge status: Home / Self Care | Payer: Medicare Other | Source: Ambulatory Visit | Attending: Internal Medicine | Admitting: Internal Medicine

## 2014-02-02 DIAGNOSIS — Z7982 Long term (current) use of aspirin: Secondary | ICD-10-CM | POA: Insufficient documentation

## 2014-02-02 DIAGNOSIS — F329 Major depressive disorder, single episode, unspecified: Secondary | ICD-10-CM | POA: Insufficient documentation

## 2014-02-02 DIAGNOSIS — F411 Generalized anxiety disorder: Secondary | ICD-10-CM | POA: Insufficient documentation

## 2014-02-02 DIAGNOSIS — R197 Diarrhea, unspecified: Secondary | ICD-10-CM | POA: Diagnosis not present

## 2014-02-02 DIAGNOSIS — K2940 Chronic atrophic gastritis without bleeding: Secondary | ICD-10-CM | POA: Diagnosis not present

## 2014-02-02 DIAGNOSIS — K2960 Other gastritis without bleeding: Secondary | ICD-10-CM

## 2014-02-02 DIAGNOSIS — F3289 Other specified depressive episodes: Secondary | ICD-10-CM | POA: Insufficient documentation

## 2014-02-02 DIAGNOSIS — Z79899 Other long term (current) drug therapy: Secondary | ICD-10-CM | POA: Insufficient documentation

## 2014-02-02 DIAGNOSIS — I10 Essential (primary) hypertension: Secondary | ICD-10-CM | POA: Diagnosis not present

## 2014-02-02 DIAGNOSIS — R1013 Epigastric pain: Secondary | ICD-10-CM | POA: Diagnosis not present

## 2014-02-02 DIAGNOSIS — F17200 Nicotine dependence, unspecified, uncomplicated: Secondary | ICD-10-CM | POA: Insufficient documentation

## 2014-02-02 DIAGNOSIS — K2100 Gastro-esophageal reflux disease with esophagitis, without bleeding: Secondary | ICD-10-CM | POA: Diagnosis not present

## 2014-02-02 DIAGNOSIS — E119 Type 2 diabetes mellitus without complications: Secondary | ICD-10-CM | POA: Diagnosis not present

## 2014-02-02 DIAGNOSIS — K921 Melena: Secondary | ICD-10-CM | POA: Insufficient documentation

## 2014-02-02 HISTORY — PX: ESOPHAGOGASTRODUODENOSCOPY: SHX5428

## 2014-02-02 SURGERY — EGD (ESOPHAGOGASTRODUODENOSCOPY)
Anesthesia: Moderate Sedation

## 2014-02-02 MED ORDER — SUCRALFATE 1 GM/10ML PO SUSP: 1.0000 g | Freq: Three times a day (TID) | ORAL | Status: DC

## 2014-02-02 MED ORDER — ONDANSETRON HCL 4 MG PO TABS: 4.0000 mg | ORAL_TABLET | Freq: Two times a day (BID) | ORAL | Status: DC | PRN

## 2014-02-02 MED ORDER — MEPERIDINE HCL 50 MG/ML IJ SOLN
INTRAMUSCULAR | Status: DC | PRN
  Administered 2014-02-02 (×2): 25 mg via INTRAVENOUS

## 2014-02-02 MED ORDER — STERILE WATER FOR IRRIGATION IR SOLN
Status: DC | PRN
  Administered 2014-02-02: 13:00:00

## 2014-02-02 MED ORDER — MIDAZOLAM HCL 5 MG/5ML IJ SOLN
INTRAMUSCULAR | Status: DC | PRN
  Administered 2014-02-02 (×5): 2 mg via INTRAVENOUS

## 2014-02-02 MED ORDER — BUTAMBEN-TETRACAINE-BENZOCAINE 2-2-14 % EX AERO
INHALATION_SPRAY | CUTANEOUS | Status: DC | PRN
  Administered 2014-02-02: 2 via TOPICAL

## 2014-02-02 MED ORDER — SODIUM CHLORIDE 0.9 % IV SOLN
INTRAVENOUS | Status: DC
  Administered 2014-02-02: 12:00:00 via INTRAVENOUS

## 2014-02-02 MED ORDER — MIDAZOLAM HCL 5 MG/5ML IJ SOLN
INTRAMUSCULAR | Status: AC
  Filled 2014-02-02: qty 10

## 2014-02-02 MED ORDER — MEPERIDINE HCL 50 MG/ML IJ SOLN
INTRAMUSCULAR | Status: DC
Start: 2014-02-02 — End: 2014-02-02
  Filled 2014-02-02: qty 1

## 2014-02-02 NOTE — Op Note (Signed)
EGD PROCEDURE REPORT  PATIENT:  [name]  MR#:  [id number] Birthdate:  April 22, 1951, 63 y.o., female Endoscopist:  Dr. Rogene Houston, MD Referred By:  Dr. Glenda Chroman, MD Procedure Date: 02/02/2014  Procedure:   EGD  Indications:  Patient is 63 year old Caucasian female with multiple medical problems who presents with history of melena and epigastric pain. She also has been having diarrhea the frequency has gradually decreased. She was seen in the office 2 days ago in her stool was guaiac negative and hemoglobin was 13.2 g.            Informed Consent:  The risks, benefits, alternatives & imponderables which include, but are not limited to, bleeding, infection, perforation, drug reaction and potential missed lesion have been reviewed.  The potential for biopsy, lesion removal, esophageal dilation, etc. have also been discussed.  Questions have been answered.  All parties agreeable.  Please see history & physical in medical record for more information.  Medications:  Demerol 50 mg IV Versed 10 mg IV Cetacaine spray topically for oropharyngeal anesthesia  Description of procedure:  The endoscope was introduced through the mouth and advanced to the second portion of the duodenum without difficulty or limitations. The mucosal surfaces were surveyed very carefully during advancement of the scope and upon withdrawal.  Findings:  Esophagus:  Mucosa of the esophagus was normal. 2 small erosions noted at GE junction. GEJ:  38 cm Stomach:  There was small amount of bile in the stomach but there was no food debris. Stomach distended very well with insufflation. Folds in the proximal stomach are unremarkable. Examination of mucosa gastric body and antrum revealed patchy erythema and edema but no erosions or ulcers were noted. Pyloric channel was patent. Angularis fundus and cardia were examined by retroflex in the scope and were unremarkable. Duodenum:  Normal bulbar and post bulbar  mucosa.  Therapeutic/Diagnostic Maneuvers Performed:   Antral biopsy taken for routine histology.  Complications:  None  Impression: Erosive reflux esophagitis. Nonerosive antral gastritis. Biopsy taken for routine histology.  Comment; These findings would not explain patient's symptoms. If gastric biopsy is negative will proceed with abdominopelvic CT.  Recommendations:  Standard instructions given. Patient can resume low-dose aspirin and clopidogrel as before. Patient will call if she experiences melena or rectal bleeding. Ondansetron 4 mg by mouth twice a day when necessary. Sucralfate 1 g by mouth a.c. and each bedtime. I will be contacting patient with results of biopsy and further recommendations.  REHMAN,NAJEEB U  02/02/2014  1:49 PM  CC: Dr. Glenda Chroman., MD & Dr. Rayne Du ref. provider found

## 2014-02-02 NOTE — Discharge Instructions (Signed)
Resume usual medications including aspirin and clopidogrel or Plavix. Sucralfate 1 g by mouth 30-60 minutes before each meal and at bedtime. Ondansetron 4 mg by mouth twice a day when necessary for nausea. Resume usual diet. No driving for 24 hours. Physician will call with results of biopsy.  Gastrointestinal Endoscopy, Care After Refer to this sheet in the next few weeks. These instructions provide you with information on caring for yourself after your procedure. Your caregiver may also give you more specific instructions. Your treatment has been planned according to current medical practices, but problems sometimes occur. Call your caregiver if you have any problems or questions after your procedure. HOME CARE INSTRUCTIONS  If you were given medicine to help you relax (sedative), do not drive, operate machinery, or sign important documents for 24 hours.  Avoid alcohol and hot or warm beverages for the first 24 hours after the procedure.  Only take over-the-counter or prescription medicines for pain, discomfort, or fever as directed by your caregiver. You may resume taking your normal medicines unless your caregiver tells you otherwise. Ask your caregiver when you may resume taking medicines that may cause bleeding, such as aspirin, clopidogrel, or warfarin.  You may return to your normal diet and activities on the day after your procedure, or as directed by your caregiver. Walking may help to reduce any bloated feeling in your abdomen.  Drink enough fluids to keep your urine clear or pale yellow.  You may gargle with salt water if you have a sore throat. SEEK IMMEDIATE MEDICAL CARE IF:  You have severe nausea or vomiting.  You have severe abdominal pain, abdominal cramps that last longer than 6 hours, or abdominal swelling (distention).  You have severe shoulder or back pain.  You have trouble swallowing.  You have shortness of breath, your breathing is shallow, or you are  breathing faster than normal.  You have a fever or a rapid heartbeat.  You vomit blood or material that looks like coffee grounds.  You have bloody, black, or tarry stools. MAKE SURE YOU:  Understand these instructions.  Will watch your condition.  Will get help right away if you are not doing well or get worse. Document Released: 02/12/2004 Document Revised: 11/14/2013 Document Reviewed: 09/30/2011 [facility] Patient Information 2015 [place], [place]. This information is not intended to replace advice given to you by your health care provider. Make sure you discuss any questions you have with your health care provider.

## 2014-02-02 NOTE — H&P (Signed)
[name] is an 62 y.o. female.   Chief Complaint: Patient is here for EGD. HPI: Patient is 63 year old Caucasian female whose been having diarrhea for about 6 weeks. Stool frequency has decreased she also was noted to have heme positive stools and give history of passing black stools. She also has been complaining of epigastric pain. She states since her symptoms began she has lost 14 pounds. At one point she was very weak but she is feeling better. She denies nausea or vomiting. She has been on low-dose aspirin and Plavix and both of these been hold for the last 4 days. He has remote history of peptic ulcer disease. H. pylori serology 10 months ago was negative. Patient was seen in the office 2 days ago and her stool was guaiac-negative. H&H on 01/31/2014 was 13.1 and 36.9. Asian has history of colonic adenomas and is up-to-date on civilians exams. Her last exam was in September 2011.  Past Medical History  Diagnosis Date  . Essential hypertension, benign   . GERD (gastroesophageal reflux disease)   . Depression   . Type 2 diabetes mellitus   . Anxiety   . Hiatal hernia   . NSTEMI (non-ST elevated myocardial infarction)     4/10  . Hyperlipidemia   . Carotid artery disease     Dr. Trula Slade, 60-79% RICA, s/p left CEA  . Fibromyalgia   . Coronary atherosclerosis of native coronary artery     DES RCA/circumflex 4/10, LVEF 65  . Arthritis     Rhumatoid and Osteoarthritis  . Renal artery stenosis     Left renal artery stent 12/13  . Restless leg syndrome   . Spondylosis without myelopathy     Past Surgical History  Procedure Laterality Date  . Tubal ligation    . Breast lumpectomy    . Lumbar disc surgery      L5-S1  . Total knee arthroplasty      Left  . Neck fusion  1996    C4 to C6  . Spine surgery  1994    Ruptured disc  . Joint replacement  March 2013    Left knee  . Joint replacement  2006    Left Knee  . Tonsilectomy, adenoidectomy, bilateral myringotomy and  tubes  1969  . Abdominal hysterectomy  1977  . Cholecystectomy  1991  . Colonoscopy    . Eye surgery      Catartact bilateral  . Carotid endarterectomy Left 02/28/2009  . Kidney stent      Left December 2013  . Esophagogastroduodenoscopy N/A 04/07/2013    Procedure: ESOPHAGOGASTRODUODENOSCOPY (EGD);  Surgeon: Rogene Houston, MD;  Location: [facility];  Service: Endoscopy;  Laterality: N/A;  250  . Multiple tooth extractions  Jan. 2015    Family History  Problem Relation Age of Onset  . Other Mother     Cerebral hemorrage  . Heart disease Mother   . Heart attack Mother   . Hyperlipidemia Mother   . Heart disease Father   . Heart attack Father   . Hyperlipidemia Father   . Heart disease Brother     Heart Disease before age 83  . Diabetes Brother   . Hypertension Brother   . Heart attack Brother   . Cancer Sister   . Arthritis Sister   . Heart disease Sister   . Heart disease Daughter 63    Heart Disease before age 88  . Heart attack Daughter   . Hypertension Daughter   .  Cancer Sister   . Arthritis Sister   . Diabetes Brother     Varicose  Veins  . Peripheral vascular disease Brother   . Diabetes Brother    Social History:  reports that she has been smoking Cigarettes.  She has a 45 pack-year smoking history. She has never used smokeless tobacco. She reports that she does not drink alcohol or use illicit drugs.  Allergies:  Allergies  Allergen Reactions  . Celecoxib     REACTION: itching  . Codeine     REACTION: itching  . Dicyclomine     Made diarrhea worse  . Lamictal [Lamotrigine]   . Lyrica [Pregabalin]     REACTION: headaches,blurred vision  . Metoclopramide Hcl     Unknown  . Morphine And Related     Unknown  . Savella [Milnacipran Hcl]   . Trazodone And Nefazodone     hallucinations  . Venlafaxine     Affected eyes  . Citalopram Palpitations    Heart rate increased  . Sertraline Hcl Other (See Comments)    Stomach upset    Medications  Prior to Admission  Medication Sig Dispense Refill  . carisoprodol (SOMA) 350 MG tablet Take 350 mg by mouth at bedtime.       . diazepam (VALIUM) 10 MG tablet Take 10 mg by mouth every 8 (eight) hours as needed. For anxiety      . fluticasone (FLONASE) 50 MCG/ACT nasal spray Place 2 sprays into both nostrils daily.      . folic acid (FOLVITE) 1 MG tablet Take 1 mg by mouth daily.        . nitroGLYCERIN (NITROSTAT) 0.4 MG SL tablet Place 0.4 mg under the tongue every 5 (five) minutes as needed. For chest pain      . oxyCODONE-acetaminophen (PERCOCET) 10-325 MG per tablet Take 1 tablet by mouth every 6 (six) hours as needed. For pain      . pantoprazole (PROTONIX) 40 MG tablet Take 1 tablet (40 mg total) by mouth daily.  90 tablet  3  . potassium chloride (K-DUR) 10 MEQ tablet Take 10 mEq by mouth daily as needed. Use only when Potassium low. (Legs start cramping)      . simvastatin (ZOCOR) 20 MG tablet Take 20 mg by mouth daily.      . TOPROL XL 25 MG 24 hr tablet TAKE ONE TABLET BY MOUTH ONCE DAILY  30 tablet  3  . triamterene-hydrochlorothiazide (MAXZIDE-25) 37.5-25 MG per tablet Take 0.5 tablets by mouth daily.      . Vitamin D, Ergocalciferol, (DRISDOL) 50000 UNITS CAPS Take 50,000 Units by mouth every 7 (seven) days. Take on mondays      . aspirin EC 81 MG tablet Take 81 mg by mouth every other day.      . clopidogrel (PLAVIX) 75 MG tablet Take 75 mg by mouth daily. Plavix is on hold at present since 7.19/2015.        Results for orders placed in visit on 01/31/14 (from the past 48 hour(s))  CBC WITH DIFFERENTIAL     Status: None   Collection Time    01/31/14  4:02 PM      Result Value Ref Range   WBC 5.9  4.0 - 10.5 K/uL   RBC 4.08  3.87 - 5.11 MIL/uL   Hemoglobin 13.1  12.0 - 15.0 g/dL   HCT 36.9  36.0 - 46.0 %   MCV 90.4  78.0 - 100.0 fL  MCH 32.1  26.0 - 34.0 pg   MCHC 35.5  30.0 - 36.0 g/dL   RDW 12.4  11.5 - 15.5 %   Platelets 272  150 - 400 K/uL   Neutrophils Relative %  66  43 - 77 %   Neutro Abs 3.9  1.7 - 7.7 K/uL   Lymphocytes Relative 26  12 - 46 %   Lymphs Abs 1.5  0.7 - 4.0 K/uL   Monocytes Relative 7  3 - 12 %   Monocytes Absolute 0.4  0.1 - 1.0 K/uL   Eosinophils Relative 1  0 - 5 %   Eosinophils Absolute 0.1  0.0 - 0.7 K/uL   Basophils Relative 0  0 - 1 %   Basophils Absolute 0.0  0.0 - 0.1 K/uL   Smear Review Criteria for review not met     No results found.  ROS  Blood pressure 167/60, pulse 67, temperature 98.3 F (36.8 C), temperature source Oral, resp. rate 18, SpO2 98.00%. Physical Exam  Constitutional:  Well-developed thin Caucasian female in NAD  HENT:  Mouth/Throat: Oropharynx is clear and moist.  Eyes: Conjunctivae are normal. No scleral icterus.  Neck: No thyromegaly present.  Cardiovascular: Normal rate, regular rhythm and normal heart sounds.   No murmur heard. Respiratory: Effort normal and breath sounds normal.  GI: Soft. She exhibits no distension and no mass. Tenderness: mild midepigastric tenderness.  Musculoskeletal: She exhibits no edema.  Lymphadenopathy:    She has no cervical adenopathy.  Neurological: She is alert.  Skin: Skin is warm and dry.     Assessment/Plan Epigastric pain. History of melena. Diagnostic EGD.  REHMAN,NAJEEB U 02/02/2014, 1:16 PM

## 2014-02-03 ENCOUNTER — Encounter (HOSPITAL_COMMUNITY): Payer: Self-pay | Admitting: Internal Medicine

## 2014-02-06 ENCOUNTER — Emergency Department (HOSPITAL_COMMUNITY): Payer: Medicare Other

## 2014-02-06 ENCOUNTER — Encounter (HOSPITAL_COMMUNITY): Payer: Self-pay | Admitting: Emergency Medicine

## 2014-02-06 ENCOUNTER — Emergency Department (HOSPITAL_COMMUNITY)
Admission: EM | Admit: 2014-02-06 | Discharge: 2014-02-07 | Discharge status: Against Medical Advice | Payer: Medicare Other | Attending: Emergency Medicine | Admitting: Emergency Medicine

## 2014-02-06 ENCOUNTER — Telehealth (INDEPENDENT_AMBULATORY_CARE_PROVIDER_SITE_OTHER): Payer: Self-pay | Admitting: *Deleted

## 2014-02-06 DIAGNOSIS — I10 Essential (primary) hypertension: Secondary | ICD-10-CM | POA: Insufficient documentation

## 2014-02-06 DIAGNOSIS — Z79899 Other long term (current) drug therapy: Secondary | ICD-10-CM | POA: Insufficient documentation

## 2014-02-06 DIAGNOSIS — R079 Chest pain, unspecified: Secondary | ICD-10-CM | POA: Insufficient documentation

## 2014-02-06 DIAGNOSIS — M129 Arthropathy, unspecified: Secondary | ICD-10-CM | POA: Diagnosis not present

## 2014-02-06 DIAGNOSIS — F411 Generalized anxiety disorder: Secondary | ICD-10-CM | POA: Insufficient documentation

## 2014-02-06 DIAGNOSIS — E785 Hyperlipidemia, unspecified: Secondary | ICD-10-CM | POA: Diagnosis not present

## 2014-02-06 DIAGNOSIS — Z9089 Acquired absence of other organs: Secondary | ICD-10-CM | POA: Insufficient documentation

## 2014-02-06 DIAGNOSIS — Z8669 Personal history of other diseases of the nervous system and sense organs: Secondary | ICD-10-CM | POA: Diagnosis not present

## 2014-02-06 DIAGNOSIS — E119 Type 2 diabetes mellitus without complications: Secondary | ICD-10-CM | POA: Diagnosis not present

## 2014-02-06 DIAGNOSIS — Z7982 Long term (current) use of aspirin: Secondary | ICD-10-CM | POA: Diagnosis not present

## 2014-02-06 DIAGNOSIS — F3289 Other specified depressive episodes: Secondary | ICD-10-CM | POA: Insufficient documentation

## 2014-02-06 DIAGNOSIS — R109 Unspecified abdominal pain: Secondary | ICD-10-CM | POA: Insufficient documentation

## 2014-02-06 DIAGNOSIS — M549 Dorsalgia, unspecified: Secondary | ICD-10-CM | POA: Diagnosis not present

## 2014-02-06 DIAGNOSIS — J029 Acute pharyngitis, unspecified: Secondary | ICD-10-CM | POA: Diagnosis not present

## 2014-02-06 DIAGNOSIS — F329 Major depressive disorder, single episode, unspecified: Secondary | ICD-10-CM | POA: Insufficient documentation

## 2014-02-06 DIAGNOSIS — Z7902 Long term (current) use of antithrombotics/antiplatelets: Secondary | ICD-10-CM | POA: Insufficient documentation

## 2014-02-06 DIAGNOSIS — R51 Headache: Secondary | ICD-10-CM | POA: Insufficient documentation

## 2014-02-06 DIAGNOSIS — I252 Old myocardial infarction: Secondary | ICD-10-CM | POA: Insufficient documentation

## 2014-02-06 DIAGNOSIS — Z9889 Other specified postprocedural states: Secondary | ICD-10-CM | POA: Diagnosis not present

## 2014-02-06 DIAGNOSIS — R519 Headache, unspecified: Secondary | ICD-10-CM

## 2014-02-06 DIAGNOSIS — IMO0002 Reserved for concepts with insufficient information to code with codable children: Secondary | ICD-10-CM | POA: Diagnosis not present

## 2014-02-06 DIAGNOSIS — Z9851 Tubal ligation status: Secondary | ICD-10-CM | POA: Insufficient documentation

## 2014-02-06 DIAGNOSIS — Z792 Long term (current) use of antibiotics: Secondary | ICD-10-CM | POA: Insufficient documentation

## 2014-02-06 DIAGNOSIS — K219 Gastro-esophageal reflux disease without esophagitis: Secondary | ICD-10-CM | POA: Insufficient documentation

## 2014-02-06 DIAGNOSIS — I2510 Atherosclerotic heart disease of native coronary artery without angina pectoris: Secondary | ICD-10-CM | POA: Diagnosis not present

## 2014-02-06 DIAGNOSIS — N390 Urinary tract infection, site not specified: Secondary | ICD-10-CM | POA: Diagnosis not present

## 2014-02-06 LAB — URINALYSIS, ROUTINE W REFLEX MICROSCOPIC
Bilirubin Urine: NEGATIVE
GLUCOSE, UA: NEGATIVE mg/dL
Ketones, ur: NEGATIVE mg/dL
Nitrite: POSITIVE — AB
Protein, ur: NEGATIVE mg/dL
Specific Gravity, Urine: <1.005 — ABNORMAL LOW (ref 1.005–1.030)
Urobilinogen, UA: 0.2 mg/dL (ref 0.0–1.0)
pH: 6 (ref 5.0–8.0)

## 2014-02-06 LAB — PROTIME-INR
INR: 0.92 (ref 0.00–1.49)
Prothrombin Time: 12.4 seconds (ref 11.6–15.2)

## 2014-02-06 LAB — COMPREHENSIVE METABOLIC PANEL
ALT: 7 U/L (ref 0–35)
AST: 14 U/L (ref 0–37)
Albumin: 4 g/dL (ref 3.5–5.2)
Alkaline Phosphatase: 88 U/L (ref 39–117)
Anion gap: 16 — ABNORMAL HIGH (ref 5–15)
BUN: 6 mg/dL (ref 6–23)
CO2: 24 meq/L (ref 19–32)
Calcium: 9.2 mg/dL (ref 8.4–10.5)
Chloride: 88 mEq/L — ABNORMAL LOW (ref 96–112)
Creatinine, Ser: 0.73 mg/dL (ref 0.50–1.10)
GFR calc Af Amer: >90 mL/min (ref >90)
GFR, EST NON AFRICAN AMERICAN: 88 mL/min — AB (ref >90)
GLUCOSE: 104 mg/dL — AB (ref 70–99)
Potassium: 2.7 mEq/L — CL (ref 3.7–5.3)
SODIUM: 128 meq/L — AB (ref 137–147)
TOTAL PROTEIN: 7.3 g/dL (ref 6.0–8.3)
Total Bilirubin: 0.5 mg/dL (ref 0.3–1.2)

## 2014-02-06 LAB — BASIC METABOLIC PANEL
Anion gap: 14 (ref 5–15)
BUN: 6 mg/dL (ref 6–23)
CHLORIDE: 90 meq/L — AB (ref 96–112)
CO2: 26 mEq/L (ref 19–32)
Calcium: 9.2 mg/dL (ref 8.4–10.5)
Creatinine, Ser: 0.72 mg/dL (ref 0.50–1.10)
GFR calc non Af Amer: 88 mL/min — ABNORMAL LOW (ref >90)
GLUCOSE: 97 mg/dL (ref 70–99)
POTASSIUM: 3.5 meq/L — AB (ref 3.7–5.3)
Sodium: 130 mEq/L — ABNORMAL LOW (ref 137–147)

## 2014-02-06 LAB — TROPONIN I
Troponin I: <0.3 ng/mL (ref <0.30)
Troponin I: <0.3 ng/mL (ref <0.30)

## 2014-02-06 LAB — CBC WITH DIFFERENTIAL/PLATELET
Basophils Absolute: 0 10*3/uL (ref 0.0–0.1)
Basophils Relative: 0 % (ref 0–1)
Eosinophils Absolute: 0.1 10*3/uL (ref 0.0–0.7)
Eosinophils Relative: 1 % (ref 0–5)
HCT: 37.1 % (ref 36.0–46.0)
Hemoglobin: 13.6 g/dL (ref 12.0–15.0)
LYMPHS ABS: 1.6 10*3/uL (ref 0.7–4.0)
LYMPHS PCT: 17 % (ref 12–46)
MCH: 33 pg (ref 26.0–34.0)
MCHC: 36.9 g/dL — ABNORMAL HIGH (ref 30.0–36.0)
MCV: 89.6 fL (ref 78.0–100.0)
Monocytes Absolute: 0.6 10*3/uL (ref 0.1–1.0)
Monocytes Relative: 6 % (ref 3–12)
NEUTROS PCT: 76 % (ref 43–77)
Neutro Abs: 7.2 10*3/uL (ref 1.7–7.7)
Platelets: 259 10*3/uL (ref 150–400)
RBC: 4.14 MIL/uL (ref 3.87–5.11)
RDW: 11.9 % (ref 11.5–15.5)
WBC: 9.4 10*3/uL (ref 4.0–10.5)

## 2014-02-06 LAB — MAGNESIUM: MAGNESIUM: 1.8 mg/dL (ref 1.5–2.5)

## 2014-02-06 LAB — URINE MICROSCOPIC-ADD ON

## 2014-02-06 MED ORDER — ONDANSETRON HCL 4 MG/2ML IJ SOLN
4.0000 mg | Freq: Once | INTRAMUSCULAR | Status: AC
  Administered 2014-02-06: 4 mg via INTRAVENOUS
  Filled 2014-02-06: qty 2

## 2014-02-06 MED ORDER — IOHEXOL 300 MG/ML  SOLN
50.0000 mL | Freq: Once | INTRAMUSCULAR | Status: AC | PRN
  Administered 2014-02-06: 50 mL via ORAL

## 2014-02-06 MED ORDER — POTASSIUM CHLORIDE CRYS ER 20 MEQ PO TBCR
40.0000 meq | EXTENDED_RELEASE_TABLET | Freq: Once | ORAL | Status: AC
  Administered 2014-02-06: 40 meq via ORAL
  Filled 2014-02-06: qty 2

## 2014-02-06 MED ORDER — HYDROMORPHONE HCL PF 1 MG/ML IJ SOLN
INTRAMUSCULAR | Status: AC
  Filled 2014-02-06: qty 1

## 2014-02-06 MED ORDER — HYDRALAZINE HCL 20 MG/ML IJ SOLN
10.0000 mg | Freq: Once | INTRAMUSCULAR | Status: AC
  Administered 2014-02-06: 10 mg via INTRAVENOUS
  Filled 2014-02-06: qty 1

## 2014-02-06 MED ORDER — DEXTROSE 5 % IV SOLN
1.0000 g | Freq: Once | INTRAVENOUS | Status: AC
  Administered 2014-02-06: 1 g via INTRAVENOUS
  Filled 2014-02-06: qty 10

## 2014-02-06 MED ORDER — HYDROMORPHONE HCL PF 1 MG/ML IJ SOLN
0.5000 mg | Freq: Once | INTRAMUSCULAR | Status: AC
  Administered 2014-02-06: 0.5 mg via INTRAVENOUS

## 2014-02-06 MED ORDER — DIAZEPAM 5 MG PO TABS
5.0000 mg | ORAL_TABLET | Freq: Once | ORAL | Status: AC
  Administered 2014-02-06: 5 mg via ORAL
  Filled 2014-02-06: qty 1

## 2014-02-06 MED ORDER — SODIUM CHLORIDE 0.9 % IV BOLUS (SEPSIS)
1000.0000 mL | Freq: Once | INTRAVENOUS | Status: AC
  Administered 2014-02-06: 1000 mL via INTRAVENOUS

## 2014-02-06 MED ORDER — POTASSIUM CHLORIDE 10 MEQ/100ML IV SOLN
10.0000 meq | INTRAVENOUS | Status: AC
  Administered 2014-02-06 (×3): 10 meq via INTRAVENOUS
  Filled 2014-02-06 (×3): qty 100

## 2014-02-06 MED ORDER — HYDROMORPHONE HCL PF 1 MG/ML IJ SOLN
0.5000 mg | Freq: Once | INTRAMUSCULAR | Status: AC
  Administered 2014-02-06: 0.5 mg via INTRAVENOUS
  Filled 2014-02-06: qty 1

## 2014-02-06 MED ORDER — IOHEXOL 300 MG/ML  SOLN
100.0000 mL | Freq: Once | INTRAMUSCULAR | Status: AC | PRN
  Administered 2014-02-06: 100 mL via INTRAVENOUS

## 2014-02-06 MED ORDER — HYDROMORPHONE HCL PF 1 MG/ML IJ SOLN: 1.0000 mg | Freq: Once | INTRAMUSCULAR | Status: DC

## 2014-02-06 MED ORDER — CEPHALEXIN 500 MG PO CAPS: 500.0000 mg | ORAL_CAPSULE | Freq: Four times a day (QID) | ORAL | Status: DC

## 2014-02-06 NOTE — ED Notes (Signed)
CRITICAL VALUE ALERT  Critical value received:  K+ 2.7 Date of notification:  02/06/14  Time of notification:  1913  Critical value read back:  Yes  Nurse who received alert:  Kayleen Memos, RN  MD notified:  Dr. Wyvonnia Dusky  Time: 5300

## 2014-02-06 NOTE — ED Notes (Signed)
Pt states she came in today due to a headache, hypertension (186/61 at home) and discomfort to abdomen after having an endoscopy last Thursday.

## 2014-02-06 NOTE — ED Notes (Signed)
Pt states that she has not any doses of Soma or Valium today - states that she takes BID

## 2014-02-06 NOTE — Telephone Encounter (Signed)
Patient is being evaluated in the emergency room; Condition reviewed with Dr. Wyvonnia Dusky.

## 2014-02-06 NOTE — Telephone Encounter (Signed)
Forwarded to Dr.Rehman. 

## 2014-02-06 NOTE — ED Provider Notes (Signed)
CSN: [id number]     Arrival date & time 02/06/14  1724 History  This chart was scribed for No att. providers found,  by Stacy Gardner, ED Scribe. The patient was seen in room APA08/APA08 and the patient's care was started at 5:50 PM.   First MD Initiated Contact with Patient 02/06/14 1744     Chief Complaint  Patient presents with  . Hypertension     (Consider location/radiation/quality/duration/timing/severity/associated sxs/prior Treatment) Patient is a 63 y.o. female presenting with hypertension. The history is provided by the patient and medical records. No language interpreter was used.  Hypertension Associated symptoms include abdominal pain and headaches. Pertinent negatives include no chest pain and no shortness of breath.   HPI Comments: [name] is a 63 y.o. female with hx of HTN and DM, presents to the Emergency Department complaining of HTN, onset this morning after waking up. Pt has the associated symptoms of headache, blurred vision , posterior neck pain, gum pain with swelling, and fever of 99.0. Pt mentions having constant abdominal pain with cramping onset yesterday. Pt reports having loose, large BM yesterday. She explains the head pain is progressively getting worse and she has not had head pain similar to this for many years.  Pt has nausea and is unable to vomit.  She has limited ROM of her neck but she explains this is baseline for her. Pt had prior neck surgery.  Pt has chest tightness, onset two hours ago. Pt has back pain but mentions this is baseline for her. She denies that the chest discomfort to her chest feels like her prior MI. She mentions having urgency and being unable to make it to the restroom last night. Pt mentions having a cough, sore throat, and post nasal drainage.Pt had an endoscopy four days ago. Pt had a BP of 183/61 and a heart rate of 91 taken while at home. Pt was told told to start her HTN medication after the surgery.Denies dizziness and  lightheadedness. Denies chest pain. Pt is taking Plavix and Asprin.      Past Medical History  Diagnosis Date  . Essential hypertension, benign   . GERD (gastroesophageal reflux disease)   . Depression   . Type 2 diabetes mellitus   . Anxiety   . Hiatal hernia   . NSTEMI (non-ST elevated myocardial infarction)     4/10  . Hyperlipidemia   . Carotid artery disease     Dr. Trula Slade, 60-79% RICA, s/p left CEA  . Fibromyalgia   . Coronary atherosclerosis of native coronary artery     DES RCA/circumflex 4/10, LVEF 65  . Arthritis     Rhumatoid and Osteoarthritis  . Renal artery stenosis     Left renal artery stent 12/13  . Restless leg syndrome   . Spondylosis without myelopathy    Past Surgical History  Procedure Laterality Date  . Tubal ligation    . Breast lumpectomy    . Lumbar disc surgery      L5-S1  . Total knee arthroplasty      Left  . Neck fusion  1996    C4 to C6  . Spine surgery  1994    Ruptured disc  . Joint replacement  March 2013    Left knee  . Joint replacement  2006    Left Knee  . Tonsilectomy, adenoidectomy, bilateral myringotomy and tubes  1969  . Abdominal hysterectomy  1977  . Cholecystectomy  1991  . Colonoscopy    .  Eye surgery      Catartact bilateral  . Carotid endarterectomy Left 02/28/2009  . Kidney stent      Left December 2013  . Esophagogastroduodenoscopy N/A 04/07/2013    Procedure: ESOPHAGOGASTRODUODENOSCOPY (EGD);  Surgeon: Rogene Houston, MD;  Location: [facility];  Service: Endoscopy;  Laterality: N/A;  250  . Multiple tooth extractions  Jan. 2015  . Esophagogastroduodenoscopy N/A 02/02/2014    Procedure: ESOPHAGOGASTRODUODENOSCOPY (EGD);  Surgeon: Rogene Houston, MD;  Location: [facility];  Service: Endoscopy;  Laterality: N/A;  120   Family History  Problem Relation Age of Onset  . Other Mother     Cerebral hemorrage  . Heart disease Mother   . Heart attack Mother   . Hyperlipidemia Mother   . Heart disease Father    . Heart attack Father   . Hyperlipidemia Father   . Heart disease Brother     Heart Disease before age 19  . Diabetes Brother   . Hypertension Brother   . Heart attack Brother   . Cancer Sister   . Arthritis Sister   . Heart disease Sister   . Heart disease Daughter 98    Heart Disease before age 45  . Heart attack Daughter   . Hypertension Daughter   . Cancer Sister   . Arthritis Sister   . Diabetes Brother     Varicose  Veins  . Peripheral vascular disease Brother   . Diabetes Brother    History  Substance Use Topics  . Smoking status: Current Every Day Smoker -- 1.00 packs/day for 45 years    Types: Cigarettes    Last Attempt to Quit: 10/12/2008  . Smokeless tobacco: Never Used     Comment: 1 cigarette a day  . Alcohol Use: No   OB History   Grav Para Term Preterm Abortions TAB SAB Ect Mult Living                 Review of Systems  Constitutional: Positive for fever.  HENT: Positive for dental problem, postnasal drip, rhinorrhea and sore throat.   Respiratory: Positive for cough and chest tightness. Negative for shortness of breath.   Cardiovascular: Negative for chest pain.  Gastrointestinal: Positive for nausea and abdominal pain. Negative for vomiting and diarrhea.  Genitourinary: Positive for urgency. Negative for enuresis.  Musculoskeletal: Positive for back pain and neck pain.  Neurological: Positive for headaches. Negative for light-headedness.  Hematological: Bruises/bleeds easily.  All other systems reviewed and are negative.     Allergies  Celecoxib; Codeine; Dicyclomine; Lamictal; Lyrica; Metoclopramide hcl; Morphine and related; Savella; Trazodone and nefazodone; Venlafaxine; Citalopram; and Sertraline hcl  Home Medications   Prior to Admission medications   Medication Sig Start Date End Date Taking? Authorizing Provider  aspirin EC 81 MG tablet Take 81 mg by mouth daily.  05/07/12  Yes Donney Dice, PA-C  carisoprodol (SOMA) 350 MG tablet  Take 350 mg by mouth at bedtime.    Yes Historical Provider, MD  clopidogrel (PLAVIX) 75 MG tablet Take 75 mg by mouth daily.    Yes Historical Provider, MD  diazepam (VALIUM) 10 MG tablet Take 10 mg by mouth every 8 (eight) hours as needed. For anxiety   Yes Historical Provider, MD  fluticasone (FLONASE) 50 MCG/ACT nasal spray Place 2 sprays into both nostrils daily.   Yes Historical Provider, MD  folic acid (FOLVITE) 1 MG tablet Take 1 mg by mouth daily.     Yes Historical  Provider, MD  metoprolol succinate (TOPROL-XL) 25 MG 24 hr tablet Take 25 mg by mouth every morning. Takes one hour after taking Maxzide   Yes Historical Provider, MD  ondansetron (ZOFRAN) 4 MG tablet Take 1 tablet (4 mg total) by mouth 2 (two) times daily as needed for nausea or vomiting. 02/02/14  Yes Rogene Houston, MD  oxyCODONE-acetaminophen (PERCOCET) 10-325 MG per tablet Take 1 tablet by mouth every 6 (six) hours as needed. For pain   Yes Historical Provider, MD  pantoprazole (PROTONIX) 40 MG tablet Take 1 tablet (40 mg total) by mouth daily. 02/16/13  Yes Butch Penny, NP  potassium chloride (K-DUR) 10 MEQ tablet Take 10 mEq by mouth daily as needed. Use only when Potassium low. (Legs start cramping)   Yes Historical Provider, MD  simvastatin (ZOCOR) 20 MG tablet Take 20 mg by mouth daily.   Yes Historical Provider, MD  sucralfate (CARAFATE) 1 GM/10ML suspension Take 10 mLs (1 g total) by mouth 4 (four) times daily -  with meals and at bedtime. 02/02/14  Yes Rogene Houston, MD  triamterene-hydrochlorothiazide (MAXZIDE-25) 37.5-25 MG per tablet Take 0.5 tablets by mouth every morning.    Yes Historical Provider, MD  Vitamin D, Ergocalciferol, (DRISDOL) 50000 UNITS CAPS Take 50,000 Units by mouth every 7 (seven) days. Take on mondays   Yes Historical Provider, MD  cephALEXin (KEFLEX) 500 MG capsule Take 1 capsule (500 mg total) by mouth 4 (four) times daily. 02/06/14   Ezequiel Essex, MD  nitroGLYCERIN (NITROSTAT) 0.4 MG SL  tablet Place 0.4 mg under the tongue every 5 (five) minutes as needed. For chest pain    Historical Provider, MD   BP 169/81  Pulse 73  Temp(Src) 98.6 F (37 C) (Rectal)  Resp 12  Ht 4\' 11"  (1.499 m)  Wt 105 lb (47.628 kg)  BMI 21.20 kg/m2  SpO2 99% Physical Exam  Nursing note and vitals reviewed. Constitutional: She is oriented to person, place, and time. She appears well-developed and well-nourished. No distress.  HENT:  Head: Normocephalic and atraumatic.  Mouth/Throat: Oropharynx is clear and moist. No oropharyngeal exudate.  No meningismus No temporal artery tenderness  Eyes: Conjunctivae and EOM are normal. Pupils are equal, round, and reactive to light.  Neck: Normal range of motion. Neck supple.  No meningismus. But limited ROM at baseline per patient.  Cardiovascular: Normal rate, regular rhythm, normal heart sounds and intact distal pulses.   No murmur heard. Temporal artery tenderness   Pulmonary/Chest: Effort normal and breath sounds normal. No respiratory distress.  Abdominal: Soft. There is tenderness (diffuse). There is guarding. There is no rebound.  Musculoskeletal: Normal range of motion. She exhibits no edema and no tenderness.  Neurological: She is alert and oriented to person, place, and time. No cranial nerve deficit. She exhibits normal muscle tone. Coordination normal.  No ataxia on finger to nose bilaterally. No pronator drift. 5/5 strength throughout. CN 2-12 intact. Negative Romberg. Equal grip strength. Sensation intact. Gait is normal.   Skin: Skin is warm.  Psychiatric: She has a normal mood and affect. Her behavior is normal.    ED Course  Procedures (including critical care time) DIAGNOSTIC STUDIES: Oxygen Saturation is 98% on room air, normal by my interpretation.    COORDINATION OF CARE:  5:55 PM Discussed course of care with pt . Pt understands and agrees.   Labs Review Labs Reviewed  CBC WITH DIFFERENTIAL - Abnormal; Notable for the  following:    MCHC 36.9 (*)  All other components within normal limits  COMPREHENSIVE METABOLIC PANEL - Abnormal; Notable for the following:    Sodium 128 (*)    Potassium 2.7 (*)    Chloride 88 (*)    Glucose, Bld 104 (*)    GFR calc non Af Amer 88 (*)    Anion gap 16 (*)    All other components within normal limits  URINALYSIS, ROUTINE W REFLEX MICROSCOPIC - Abnormal; Notable for the following:    Specific Gravity, Urine <1.005 (*)    Hgb urine dipstick TRACE (*)    Nitrite POSITIVE (*)    Leukocytes, UA TRACE (*)    All other components within normal limits  URINE MICROSCOPIC-ADD ON - Abnormal; Notable for the following:    Squamous Epithelial / LPF FEW (*)    Bacteria, UA FEW (*)    All other components within normal limits  BASIC METABOLIC PANEL - Abnormal; Notable for the following:    Sodium 130 (*)    Potassium 3.5 (*)    Chloride 90 (*)    GFR calc non Af Amer 88 (*)    All other components within normal limits  URINE CULTURE  TROPONIN I  MAGNESIUM  PROTIME-INR  TROPONIN I    Imaging Review Ct Head Wo Contrast  02/06/2014   CLINICAL DATA:  Headache.  History of hypertension.  EXAM: CT HEAD WITHOUT CONTRAST  TECHNIQUE: Contiguous axial images were obtained from the base of the skull through the vertex without intravenous contrast.  COMPARISON:  None.  FINDINGS: The brain appears normal without infarct, hemorrhage, mass lesion, mass effect, midline shift or abnormal extra-axial fluid collection. No hydrocephalus or pneumocephalus. The calvarium is intact. Atherosclerosis noted.  IMPRESSION: No acute finding.   Electronically Signed   By: Inge Rise M.D.   On: 02/06/2014 18:49   Ct Abdomen Pelvis W Contrast  02/06/2014   CLINICAL DATA:  Abdominal pain after EGD. Hypertension and headache.  EXAM: CT ABDOMEN AND PELVIS WITH CONTRAST  TECHNIQUE: Multidetector CT imaging of the abdomen and pelvis was performed using the standard protocol following bolus administration  of intravenous contrast.  CONTRAST:  72mL OMNIPAQUE IOHEXOL 300 MG/ML SOLN, 146mL OMNIPAQUE IOHEXOL 300 MG/ML SOLN  COMPARISON:  01/24/2013  FINDINGS: Emphysematous changes and dependent atelectasis in the lung bases. Coronary artery and aortic valve calcifications.  Surgical absence of the gallbladder. The liver, spleen, pancreas, adrenal glands, inferior vena cava, and retroperitoneal lymph nodes are unremarkable. Prominent diffuse calcific atherosclerotic changes throughout the abdominal aorta iliac arteries. There to iliac vessels appear patent but stenosis is not excluded. Probable calcific stenosis in the proximal superior mesenteric artery. Calcification versus stent in the origin of the renal arteries. Renal nephrogram is are symmetrical. Mild scarring in the kidneys. Sub cm parenchymal cysts in both kidneys. No hydronephrosis. Stomach, small bowel, and colon are normal for degree of distention. Scattered stool in the colon. Contrast material flows to the colon suggesting no evidence of bowel obstruction. No free air or free fluid in the abdomen  Pelvis: Bladder wall is not thickened. Uterus appears to be surgically absent. No pelvic mass or lymphadenopathy. No free or loculated pelvic fluid collections. Appendix is not identified. No diverticulitis. Degenerative changes in the lumbar spine. No destructive bone lesions. Prominent disc osteophyte complex at L5-S1.  IMPRESSION: Extensive atherosclerotic vascular disease. No focal acute process demonstrated in the abdomen and pelvis.   Electronically Signed   By: Lucienne Capers M.D.   On: 02/06/2014 21:56  Dg Abd Acute W/chest  02/06/2014   CLINICAL DATA:  Hypertension.  Abdominal pain and chills.  EXAM: ACUTE ABDOMEN SERIES (ABDOMEN 2 VIEW & CHEST 1 VIEW)  COMPARISON:  CT abdomen and pelvis 01/24/2013. Chest in two views abdomen 01/05/2013.  FINDINGS: Single view of the chest demonstrates clear lungs and normal heart size. No pneumothorax or pleural  effusion.  Two views of the abdomen show no free intraperitoneal air. The bowel gas pattern is unremarkable. No abnormal abdominal calcification is seen. Cholecystectomy clips are noted.  IMPRESSION: No acute finding chest or abdomen.   Electronically Signed   By: Inge Rise M.D.   On: 02/06/2014 18:55     EKG Interpretation   Date/Time:  Monday February 06 2014 18:22:01 EDT Ventricular Rate:  70 PR Interval:    QRS Duration: 76 QT Interval:  401 QTC Calculation: 433 R Axis:   74 Text Interpretation:  Normal sinus rhythm U waves present Consider left  ventricular hypertrophy Confirmed by Wyvonnia Dusky  MD, Maliq Pilley [phone]) on  02/06/2014 6:26:36 PM      MDM   Final diagnoses:  Headache, unspecified headache type  Essential hypertension  Urinary tract infection without hematuria, site unspecified   Patient awoke with posterior headache this morning it has gradually gotten worse associated with elevated blood pressure. Complains of diffuse abdominal pain after endoscopy last week. No focal weakness, numbness or tingling. No fever.  Denies thunderclap onset. Denies neck stiffness but has limited ROM of neck at baseline.. No meningismus.  CT head negative. Labs remarkable for hypokalemia of 2.7.  Replacement ordered. CT abdomen shows extensive atherosclerotic disease but no acute pathology. Dr. Laural Golden called to check on patient and it appears she is stable from a GI and post endoscopy standpoint. EGD showed esophagitis and gastritis.  No perforation or other complication today. K improved to 3.5 on recheck.  His blood pressure has improved to 160/80. She complains of a headache and neck pain with limited ROM. Her neurological exam is nonfocal. She is able to ambulate. Some concern for subarachnoid hemorrhage. Lumbar puncture recommended. Patient declined this test. She understands the small risk that subarachnoid hemorrhage could be missed. She has capacity to make her own medical  decisions and leave [place].  BP 169/81  Pulse 73  Temp(Src) 98.6 F (37 C) (Rectal)  Resp 12  Ht 4\' 11"  (1.499 m)  Wt 105 lb (47.628 kg)  BMI 21.20 kg/m2  SpO2 99%   I personally performed the services described in this documentation, which was scribed in my presence. The recorded information has been reviewed and is accurate.   Ezequiel Essex, MD 02/07/14 [phone]

## 2014-02-06 NOTE — ED Notes (Signed)
C/o HA and HTN, had endo and dx with 2 erosions per pt

## 2014-02-06 NOTE — ED Notes (Signed)
MD at bedside. 

## 2014-02-06 NOTE — Telephone Encounter (Signed)
[name] said she has a fever 99.2, headache, BP is running aroung 160-170/60. "She can not take it anymore. She is not well and doesn't feel good." [name] states she is going to go to [facility] ED. The return phone number is [phone].

## 2014-02-06 NOTE — Discharge Instructions (Signed)
Hypertension Lumbar puncture was recommended which you declined.  Take the medications for a urinary tract infection. Follow up with your doctor. You are leaving against medical advice. Return to the ED if you wish to be reevaluated. Hypertension, commonly called high blood pressure, is when the force of blood pumping through your arteries is too strong. Your arteries are the blood vessels that carry blood from your heart throughout your body. A blood pressure reading consists of a higher number over a lower number, such as 110/72. The higher number (systolic) is the pressure inside your arteries when your heart pumps. The lower number (diastolic) is the pressure inside your arteries when your heart relaxes. Ideally you want your blood pressure below 120/80. Hypertension forces your heart to work harder to pump blood. Your arteries may become narrow or stiff. Having hypertension puts you at risk for heart disease, stroke, and other problems.  RISK FACTORS Some risk factors for high blood pressure are controllable. Others are not.  Risk factors you cannot control include:   Race. You may be at higher risk if you are African American.  Age. Risk increases with age.  Gender. Men are at higher risk than women before age 21 years. After age 60, women are at higher risk than men. Risk factors you can control include:  Not getting enough exercise or physical activity.  Being overweight.  Getting too much fat, sugar, calories, or salt in your diet.  Drinking too much alcohol. SIGNS AND SYMPTOMS Hypertension does not usually cause signs or symptoms. Extremely high blood pressure (hypertensive crisis) may cause headache, anxiety, shortness of breath, and nosebleed. DIAGNOSIS  To check if you have hypertension, your health care provider will measure your blood pressure while you are seated, with your arm held at the level of your heart. It should be measured at least twice using the same arm. Certain  conditions can cause a difference in blood pressure between your right and left arms. A blood pressure reading that is higher than normal on one occasion does not mean that you need treatment. If one blood pressure reading is high, ask your health care provider about having it checked again. TREATMENT  Treating high blood pressure includes making lifestyle changes and possibly taking medicine. Living a healthy lifestyle can help lower high blood pressure. You may need to change some of your habits. Lifestyle changes may include:  Following the DASH diet. This diet is high in fruits, vegetables, and whole grains. It is low in salt, red meat, and added sugars.  Getting at least 2 hours of brisk physical activity every week.  Losing weight if necessary.  Not smoking.  Limiting alcoholic beverages.  Learning ways to reduce stress. If lifestyle changes are not enough to get your blood pressure under control, your health care provider may prescribe medicine. You may need to take more than one. Work closely with your health care provider to understand the risks and benefits. HOME CARE INSTRUCTIONS  Have your blood pressure rechecked as directed by your health care provider.   Take medicines only as directed by your health care provider. Follow the directions carefully. Blood pressure medicines must be taken as prescribed. The medicine does not work as well when you skip doses. Skipping doses also puts you at risk for problems.   Do not smoke.   Monitor your blood pressure at home as directed by your health care provider. SEEK MEDICAL CARE IF:   You think you are having a reaction to  medicines taken.  You have recurrent headaches or feel dizzy.  You have swelling in your ankles.  You have trouble with your vision. SEEK IMMEDIATE MEDICAL CARE IF:  You develop a severe headache or confusion.  You have unusual weakness, numbness, or feel faint.  You have severe chest or abdominal  pain.  You vomit repeatedly.  You have trouble breathing. MAKE SURE YOU:   Understand these instructions.  Will watch your condition.  Will get help right away if you are not doing well or get worse. Document Released: 06/30/2005 Document Revised: 11/14/2013 Document Reviewed: 04/22/2013 [facility] Patient Information 2015 [place], [place]. This information is not intended to replace advice given to you by your health care provider. Make sure you discuss any questions you have with your health care provider.

## 2014-02-07 DIAGNOSIS — I10 Essential (primary) hypertension: Secondary | ICD-10-CM | POA: Diagnosis not present

## 2014-02-07 MED ORDER — OXYCODONE-ACETAMINOPHEN 5-325 MG PO TABS
1.0000 | ORAL_TABLET | Freq: Once | ORAL | Status: AC
  Administered 2014-02-07: 1 via ORAL
  Filled 2014-02-07: qty 1

## 2014-02-07 NOTE — ED Notes (Signed)
Rx for Keflex 500 mg # 40 called to [facility] in [place]

## 2014-02-09 LAB — URINE CULTURE

## 2014-02-10 ENCOUNTER — Encounter (INDEPENDENT_AMBULATORY_CARE_PROVIDER_SITE_OTHER): Payer: Self-pay | Admitting: *Deleted

## 2014-02-10 ENCOUNTER — Telehealth (HOSPITAL_BASED_OUTPATIENT_CLINIC_OR_DEPARTMENT_OTHER): Payer: Self-pay | Admitting: Emergency Medicine

## 2014-02-10 NOTE — Telephone Encounter (Signed)
Post ED Visit - Positive Culture Follow-up: Successful Patient Follow-Up  Culture assessed and recommendations reviewed by: []  Wes Caro, Pharm.D., BCPS [x]  Heide Guile, Pharm.D., BCPS []  Alycia Rossetti, Pharm.D., BCPS []  [place], [place].D., BCPS, AAHIVP []  Legrand Como, Pharm.D., BCPS, AAHIVP  Positive urine culture  []  Patient discharged without antimicrobial prescription and treatment is now indicated [x]  Organism is resistant to prescribed ED discharge antimicrobial []  Patient with positive blood cultures  Changes discussed with ED provider: Alvina Chou PA-C New antibiotic prescription: Amoxicillin 250 mg PO TID x 7 days    Adenike Shidler 02/10/2014, 11:15 AM

## 2014-02-10 NOTE — Progress Notes (Signed)
ED Antimicrobial Stewardship Positive Culture Follow Up   [name] is an 63 y.o. female who presented to [facility] on 02/06/2014 with a chief complaint of  Chief Complaint  Patient presents with  . Hypertension    Recent Results (from the past 720 hour(s))  URINE CULTURE     Status: None   Collection Time    02/06/14  8:04 PM      Result Value Ref Range Status   Specimen Description URINE, CLEAN CATCH   Final   Special Requests NONE   Final   Culture  Setup Time     Final   Value: 02/07/2014 14:28     Performed at [facility]     Final   Value: >=100,000 COLONIES/ML     Performed at [facility]   Culture     Final   Value: ENTEROCOCCUS SPECIES     Performed at [facility]   Report Status 02/09/2014 FINAL   Final   Organism ID, Bacteria ENTEROCOCCUS SPECIES   Final    [x]  Treated with cephalexin, organism resistant to prescribed antimicrobial   New antibiotic prescription: amoxicillin 250mg  po TID x 7 days.  Stop taking cephalexin.  ED Provider: Alvina Chou, PA-C   Candie Mile 02/10/2014, 10:02 AM Infectious Diseases Pharmacist Phone# [phone]

## 2014-04-20 ENCOUNTER — Ambulatory Visit (INDEPENDENT_AMBULATORY_CARE_PROVIDER_SITE_OTHER): Payer: Medicare Other | Admitting: Internal Medicine

## 2014-04-25 ENCOUNTER — Ambulatory Visit: Payer: Medicare Other | Admitting: Cardiology

## 2014-05-18 ENCOUNTER — Ambulatory Visit: Payer: Medicare Other | Admitting: Cardiology

## 2014-06-01 ENCOUNTER — Telehealth: Payer: Self-pay | Admitting: Surgery

## 2014-06-01 NOTE — Telephone Encounter (Signed)
-----   Message from [facility], RVT sent at 06/01/2014  9:43 AM EST ----- Regarding: RE: Renal Duplex scheduled 08/07/2014 I reviewed this patients chart and a CT scan done on this patient 01/2014 shows calcification vs. Stent in the origin of the renal arteries. Either is an appropriate indication for a follow up renal exam, so a renal duplex is warranted.  Notes will be placed in the appointment notes stating stenosis vs. Stent as a CT finding to inform the technologist as to why the test needs to be performed.    Juliann Pulse  ----- Message -----    From: Rufina Falco    Sent: 05/31/2014   1:52 PM      To: Kathi Ludwig Nickel, NP, # Subject: Renal Duplex scheduled 08/07/2014               Should we follow an essentially normal renal in six months?   Please advise.  Rip Harbour

## 2014-06-20 ENCOUNTER — Ambulatory Visit (INDEPENDENT_AMBULATORY_CARE_PROVIDER_SITE_OTHER): Payer: Medicare Other | Admitting: Cardiology

## 2014-06-20 ENCOUNTER — Encounter: Payer: Self-pay | Admitting: Cardiology

## 2014-06-20 VITALS — BP 140/70 | HR 89 | Ht 59.0 in | Wt 111.0 lb

## 2014-06-20 DIAGNOSIS — I10 Essential (primary) hypertension: Secondary | ICD-10-CM

## 2014-06-20 DIAGNOSIS — I2510 Atherosclerotic heart disease of native coronary artery without angina pectoris: Secondary | ICD-10-CM

## 2014-06-20 DIAGNOSIS — I739 Peripheral vascular disease, unspecified: Secondary | ICD-10-CM

## 2014-06-20 NOTE — Assessment & Plan Note (Addendum)
Symptomatically stable on medical therapy. Ischemic testing reassuring within the last 2 years. Plan to continue observation, follow-up in 6 months.

## 2014-06-20 NOTE — Assessment & Plan Note (Signed)
History of carotid artery disease and renal artery disease, prior left renal artery stent placement. Keep follow-up with vascular surgery.

## 2014-06-20 NOTE — Progress Notes (Signed)
Reason for visit: CAD  Clinical Summary [name] is a 63 y.o.female last seen in April. Interval records reviewed, she had GI workup with Dr. Laural Golden back in July, was off aspirin and Plavix temporarily. She continues to follow in the vascular clinic as well.   She reports no angina symptoms on medical therapy, stable dyspnea on exertion. She is limited somewhat by knee pain that has been chronic. She reports no increasing nitroglycerin use. Otherwise remains on beta blocker and statin. Continues to smoke cigarettes unfortunately, has not been able to quit.  Lab work from July showed BUN 6, creatinine 0.7.  Stress echocardiogram from October 2013 was negative for ischemia.  Allergies  Allergen Reactions  . Celecoxib Itching  . Codeine Itching  . Dicyclomine Diarrhea    Made diarrhea worse  . Lamictal [Lamotrigine]   . Lyrica [Pregabalin] Other (See Comments)    REACTION: headaches,blurred vision  . Metoclopramide Hcl Other (See Comments)    Unknown  . Morphine And Related Other (See Comments)    Unknown  . Savella [Milnacipran Hcl] Other (See Comments)    Unknown   . Trazodone And Nefazodone Other (See Comments)    hallucinations  . Venlafaxine Other (See Comments)    Affected eyes  . Citalopram Palpitations    Heart rate increased  . Sertraline Hcl Other (See Comments)    Stomach upset    Current Outpatient Prescriptions  Medication Sig Dispense Refill  . aspirin EC 81 MG tablet Take 81 mg by mouth daily.     . carisoprodol (SOMA) 350 MG tablet Take 350 mg by mouth at bedtime.     . cephALEXin (KEFLEX) 500 MG capsule Take 1 capsule (500 mg total) by mouth 4 (four) times daily. 40 capsule 0  . clopidogrel (PLAVIX) 75 MG tablet Take 75 mg by mouth daily.     . diazepam (VALIUM) 10 MG tablet Take 10 mg by mouth every 8 (eight) hours as needed. For anxiety    . fluticasone (FLONASE) 50 MCG/ACT nasal spray Place 2 sprays into both nostrils daily.    . folic acid (FOLVITE)  1 MG tablet Take 1 mg by mouth daily.      . metoprolol succinate (TOPROL-XL) 25 MG 24 hr tablet Take 25 mg by mouth every morning. Takes one hour after taking Maxzide    . nitroGLYCERIN (NITROSTAT) 0.4 MG SL tablet Place 0.4 mg under the tongue every 5 (five) minutes as needed. For chest pain    . ondansetron (ZOFRAN) 4 MG tablet Take 1 tablet (4 mg total) by mouth 2 (two) times daily as needed for nausea or vomiting. 20 tablet 0  . oxyCODONE-acetaminophen (PERCOCET) 10-325 MG per tablet Take 1 tablet by mouth every 6 (six) hours as needed. For pain    . pantoprazole (PROTONIX) 40 MG tablet Take 1 tablet (40 mg total) by mouth daily. 90 tablet 3  . potassium chloride (K-DUR) 10 MEQ tablet Take 10 mEq by mouth daily as needed. Use only when Potassium low. (Legs start cramping)    . simvastatin (ZOCOR) 20 MG tablet Take 20 mg by mouth daily.    . sucralfate (CARAFATE) 1 GM/10ML suspension Take 10 mLs (1 g total) by mouth 4 (four) times daily -  with meals and at bedtime. 420 mL 0  . triamterene-hydrochlorothiazide (MAXZIDE-25) 37.5-25 MG per tablet Take 0.5 tablets by mouth every morning.     . Vitamin D, Ergocalciferol, (DRISDOL) 50000 UNITS CAPS Take 50,000 Units by mouth  every 7 (seven) days. Take on mondays     No current facility-administered medications for this visit.    Past Medical History  Diagnosis Date  . Essential hypertension, benign   . GERD (gastroesophageal reflux disease)   . Depression   . Type 2 diabetes mellitus   . Anxiety   . Hiatal hernia   . NSTEMI (non-ST elevated myocardial infarction)     4/10  . Hyperlipidemia   . Carotid artery disease     Dr. Trula Slade, 60-79% RICA, s/p left CEA  . Fibromyalgia   . Coronary atherosclerosis of native coronary artery     DES RCA/circumflex 4/10, LVEF 65  . Arthritis     Rhumatoid and Osteoarthritis  . Renal artery stenosis     Left renal artery stent 12/13  . Restless leg syndrome   . Spondylosis without myelopathy      Social History [name] reports that she quit smoking about 3 months ago. Her smoking use included Cigarettes. She has a 45 pack-year smoking history. She has never used smokeless tobacco. [name] reports that she does not drink alcohol.  Review of Systems Complete review of systems negative except as otherwise outlined in the clinical summary and also the following. Chronic leg pain.  Physical Examination Filed Vitals:   06/20/14 1612  BP: 140/70  Pulse: 89   Filed Weights   06/20/14 1612  Weight: 111 lb (50.349 kg)    No distress.  HEENT: Conjunctiva and lids normal, oropharynx clear.  Neck: Supple, no elevated JVP, bilateral carotid bruits noted, right greater than left. Well-healed CEA scar on left.  Lungs: Clear to auscultation, diminished, nonlabored.  Cardiac: Regular rate and rhythm, no S3 or rub. Soft systolic murmur and S4.  Extremities: No pitting edema.    Problem List and Plan   CAD, NATIVE VESSEL Symptomatically stable on medical therapy. Ischemic testing reassuring within the last 2 years. Plan to continue observation, follow-up in 6 months.  Essential hypertension, benign Continue current regimen and keep follow-up with Dr. Woody Seller.   PAD (peripheral artery disease) History of carotid artery disease and renal artery disease, prior left renal artery stent placement. Keep follow-up with vascular surgery.    Satira Sark, M.D., F.A.C.C.

## 2014-06-20 NOTE — Patient Instructions (Signed)

## 2014-06-20 NOTE — Assessment & Plan Note (Signed)
Continue current regimen and keep follow-up with Dr. Woody Seller.

## 2014-06-22 ENCOUNTER — Encounter (HOSPITAL_COMMUNITY): Payer: Self-pay | Admitting: Surgery

## 2014-06-30 ENCOUNTER — Encounter (INDEPENDENT_AMBULATORY_CARE_PROVIDER_SITE_OTHER): Payer: Self-pay

## 2014-08-03 ENCOUNTER — Encounter: Payer: Self-pay | Admitting: Family

## 2014-08-07 ENCOUNTER — Other Ambulatory Visit (HOSPITAL_COMMUNITY): Payer: Medicare Other

## 2014-08-07 ENCOUNTER — Ambulatory Visit: Payer: Medicare Other | Admitting: Family

## 2015-07-25 DIAGNOSIS — J449 Chronic obstructive pulmonary disease, unspecified: Secondary | ICD-10-CM | POA: Diagnosis not present

## 2015-07-25 DIAGNOSIS — I10 Essential (primary) hypertension: Secondary | ICD-10-CM | POA: Diagnosis not present

## 2015-07-25 DIAGNOSIS — Z6823 Body mass index (BMI) 23.0-23.9, adult: Secondary | ICD-10-CM | POA: Diagnosis not present

## 2015-07-25 DIAGNOSIS — R21 Rash and other nonspecific skin eruption: Secondary | ICD-10-CM | POA: Diagnosis not present

## 2015-07-25 DIAGNOSIS — R110 Nausea: Secondary | ICD-10-CM | POA: Diagnosis not present

## 2015-08-21 DIAGNOSIS — M5136 Other intervertebral disc degeneration, lumbar region: Secondary | ICD-10-CM | POA: Diagnosis not present

## 2015-08-21 DIAGNOSIS — Z79891 Long term (current) use of opiate analgesic: Secondary | ICD-10-CM | POA: Diagnosis not present

## 2015-08-21 DIAGNOSIS — M47816 Spondylosis without myelopathy or radiculopathy, lumbar region: Secondary | ICD-10-CM | POA: Diagnosis not present

## 2015-08-21 DIAGNOSIS — L0231 Cutaneous abscess of buttock: Secondary | ICD-10-CM | POA: Diagnosis not present

## 2015-08-21 DIAGNOSIS — M797 Fibromyalgia: Secondary | ICD-10-CM | POA: Diagnosis not present

## 2015-08-28 DIAGNOSIS — J449 Chronic obstructive pulmonary disease, unspecified: Secondary | ICD-10-CM | POA: Diagnosis not present

## 2015-08-28 DIAGNOSIS — S90822A Blister (nonthermal), left foot, initial encounter: Secondary | ICD-10-CM | POA: Diagnosis not present

## 2015-08-28 DIAGNOSIS — M1990 Unspecified osteoarthritis, unspecified site: Secondary | ICD-10-CM | POA: Diagnosis not present

## 2015-08-28 DIAGNOSIS — I10 Essential (primary) hypertension: Secondary | ICD-10-CM | POA: Diagnosis not present

## 2015-10-03 DIAGNOSIS — M797 Fibromyalgia: Secondary | ICD-10-CM | POA: Diagnosis not present

## 2015-10-03 DIAGNOSIS — M545 Low back pain: Secondary | ICD-10-CM | POA: Diagnosis not present

## 2015-10-03 DIAGNOSIS — L0231 Cutaneous abscess of buttock: Secondary | ICD-10-CM | POA: Diagnosis not present

## 2015-10-03 DIAGNOSIS — J449 Chronic obstructive pulmonary disease, unspecified: Secondary | ICD-10-CM | POA: Diagnosis not present

## 2015-10-04 DIAGNOSIS — L0231 Cutaneous abscess of buttock: Secondary | ICD-10-CM | POA: Diagnosis not present

## 2015-10-05 DIAGNOSIS — M797 Fibromyalgia: Secondary | ICD-10-CM | POA: Diagnosis not present

## 2015-10-05 DIAGNOSIS — Z955 Presence of coronary angioplasty implant and graft: Secondary | ICD-10-CM | POA: Diagnosis not present

## 2015-10-05 DIAGNOSIS — L0231 Cutaneous abscess of buttock: Secondary | ICD-10-CM | POA: Diagnosis not present

## 2015-10-05 DIAGNOSIS — Z888 Allergy status to other drugs, medicaments and biological substances status: Secondary | ICD-10-CM | POA: Diagnosis not present

## 2015-10-05 DIAGNOSIS — I252 Old myocardial infarction: Secondary | ICD-10-CM | POA: Diagnosis not present

## 2015-10-05 DIAGNOSIS — Z7902 Long term (current) use of antithrombotics/antiplatelets: Secondary | ICD-10-CM | POA: Diagnosis not present

## 2015-10-05 DIAGNOSIS — E785 Hyperlipidemia, unspecified: Secondary | ICD-10-CM | POA: Diagnosis not present

## 2015-10-05 DIAGNOSIS — Z7951 Long term (current) use of inhaled steroids: Secondary | ICD-10-CM | POA: Diagnosis not present

## 2015-10-05 DIAGNOSIS — I10 Essential (primary) hypertension: Secondary | ICD-10-CM | POA: Diagnosis not present

## 2015-10-05 DIAGNOSIS — Z96652 Presence of left artificial knee joint: Secondary | ICD-10-CM | POA: Diagnosis not present

## 2015-10-05 DIAGNOSIS — Z886 Allergy status to analgesic agent status: Secondary | ICD-10-CM | POA: Diagnosis not present

## 2015-10-05 DIAGNOSIS — M069 Rheumatoid arthritis, unspecified: Secondary | ICD-10-CM | POA: Diagnosis not present

## 2015-10-05 DIAGNOSIS — Z79899 Other long term (current) drug therapy: Secondary | ICD-10-CM | POA: Diagnosis not present

## 2015-10-05 DIAGNOSIS — E119 Type 2 diabetes mellitus without complications: Secondary | ICD-10-CM | POA: Diagnosis not present

## 2015-10-06 DIAGNOSIS — Z4800 Encounter for change or removal of nonsurgical wound dressing: Secondary | ICD-10-CM | POA: Diagnosis not present

## 2015-10-06 DIAGNOSIS — M797 Fibromyalgia: Secondary | ICD-10-CM | POA: Diagnosis not present

## 2015-10-06 DIAGNOSIS — L0231 Cutaneous abscess of buttock: Secondary | ICD-10-CM | POA: Diagnosis not present

## 2015-10-06 DIAGNOSIS — E119 Type 2 diabetes mellitus without complications: Secondary | ICD-10-CM | POA: Diagnosis not present

## 2015-10-08 DIAGNOSIS — M797 Fibromyalgia: Secondary | ICD-10-CM | POA: Diagnosis not present

## 2015-10-08 DIAGNOSIS — L0231 Cutaneous abscess of buttock: Secondary | ICD-10-CM | POA: Diagnosis not present

## 2015-10-08 DIAGNOSIS — Z4800 Encounter for change or removal of nonsurgical wound dressing: Secondary | ICD-10-CM | POA: Diagnosis not present

## 2015-10-08 DIAGNOSIS — E119 Type 2 diabetes mellitus without complications: Secondary | ICD-10-CM | POA: Diagnosis not present

## 2015-10-10 DIAGNOSIS — Z4800 Encounter for change or removal of nonsurgical wound dressing: Secondary | ICD-10-CM | POA: Diagnosis not present

## 2015-10-10 DIAGNOSIS — L0231 Cutaneous abscess of buttock: Secondary | ICD-10-CM | POA: Diagnosis not present

## 2015-10-10 DIAGNOSIS — M797 Fibromyalgia: Secondary | ICD-10-CM | POA: Diagnosis not present

## 2015-10-10 DIAGNOSIS — E119 Type 2 diabetes mellitus without complications: Secondary | ICD-10-CM | POA: Diagnosis not present

## 2015-10-15 DIAGNOSIS — L0231 Cutaneous abscess of buttock: Secondary | ICD-10-CM | POA: Diagnosis not present

## 2015-10-15 DIAGNOSIS — E119 Type 2 diabetes mellitus without complications: Secondary | ICD-10-CM | POA: Diagnosis not present

## 2015-10-15 DIAGNOSIS — Z4800 Encounter for change or removal of nonsurgical wound dressing: Secondary | ICD-10-CM | POA: Diagnosis not present

## 2015-10-15 DIAGNOSIS — M797 Fibromyalgia: Secondary | ICD-10-CM | POA: Diagnosis not present

## 2015-10-22 DIAGNOSIS — I2510 Atherosclerotic heart disease of native coronary artery without angina pectoris: Secondary | ICD-10-CM | POA: Diagnosis not present

## 2015-10-22 DIAGNOSIS — E7800 Pure hypercholesterolemia, unspecified: Secondary | ICD-10-CM | POA: Diagnosis not present

## 2015-10-22 DIAGNOSIS — I10 Essential (primary) hypertension: Secondary | ICD-10-CM | POA: Diagnosis not present

## 2015-10-22 DIAGNOSIS — J449 Chronic obstructive pulmonary disease, unspecified: Secondary | ICD-10-CM | POA: Diagnosis not present

## 2015-10-23 DIAGNOSIS — G039 Meningitis, unspecified: Secondary | ICD-10-CM | POA: Diagnosis not present

## 2015-10-23 DIAGNOSIS — M47816 Spondylosis without myelopathy or radiculopathy, lumbar region: Secondary | ICD-10-CM | POA: Diagnosis not present

## 2015-10-23 DIAGNOSIS — M797 Fibromyalgia: Secondary | ICD-10-CM | POA: Diagnosis not present

## 2015-10-23 DIAGNOSIS — M5136 Other intervertebral disc degeneration, lumbar region: Secondary | ICD-10-CM | POA: Diagnosis not present

## 2015-10-24 DIAGNOSIS — M797 Fibromyalgia: Secondary | ICD-10-CM | POA: Diagnosis not present

## 2015-10-24 DIAGNOSIS — L0231 Cutaneous abscess of buttock: Secondary | ICD-10-CM | POA: Diagnosis not present

## 2015-10-24 DIAGNOSIS — E119 Type 2 diabetes mellitus without complications: Secondary | ICD-10-CM | POA: Diagnosis not present

## 2015-10-24 DIAGNOSIS — Z4800 Encounter for change or removal of nonsurgical wound dressing: Secondary | ICD-10-CM | POA: Diagnosis not present

## 2015-10-31 DIAGNOSIS — E119 Type 2 diabetes mellitus without complications: Secondary | ICD-10-CM | POA: Diagnosis not present

## 2015-10-31 DIAGNOSIS — Z4800 Encounter for change or removal of nonsurgical wound dressing: Secondary | ICD-10-CM | POA: Diagnosis not present

## 2015-10-31 DIAGNOSIS — M797 Fibromyalgia: Secondary | ICD-10-CM | POA: Diagnosis not present

## 2015-10-31 DIAGNOSIS — L0231 Cutaneous abscess of buttock: Secondary | ICD-10-CM | POA: Diagnosis not present

## 2015-11-07 DIAGNOSIS — G4700 Insomnia, unspecified: Secondary | ICD-10-CM | POA: Diagnosis not present

## 2015-11-07 DIAGNOSIS — R350 Frequency of micturition: Secondary | ICD-10-CM | POA: Diagnosis not present

## 2015-11-07 DIAGNOSIS — J069 Acute upper respiratory infection, unspecified: Secondary | ICD-10-CM | POA: Diagnosis not present

## 2015-11-07 DIAGNOSIS — J449 Chronic obstructive pulmonary disease, unspecified: Secondary | ICD-10-CM | POA: Diagnosis not present

## 2015-11-13 DIAGNOSIS — I2510 Atherosclerotic heart disease of native coronary artery without angina pectoris: Secondary | ICD-10-CM | POA: Diagnosis not present

## 2015-11-13 DIAGNOSIS — I10 Essential (primary) hypertension: Secondary | ICD-10-CM | POA: Diagnosis not present

## 2015-11-13 DIAGNOSIS — J449 Chronic obstructive pulmonary disease, unspecified: Secondary | ICD-10-CM | POA: Diagnosis not present

## 2015-11-13 DIAGNOSIS — E7800 Pure hypercholesterolemia, unspecified: Secondary | ICD-10-CM | POA: Diagnosis not present

## 2015-12-17 DIAGNOSIS — E7800 Pure hypercholesterolemia, unspecified: Secondary | ICD-10-CM | POA: Diagnosis not present

## 2015-12-17 DIAGNOSIS — I10 Essential (primary) hypertension: Secondary | ICD-10-CM | POA: Diagnosis not present

## 2015-12-17 DIAGNOSIS — I2510 Atherosclerotic heart disease of native coronary artery without angina pectoris: Secondary | ICD-10-CM | POA: Diagnosis not present

## 2015-12-17 DIAGNOSIS — J449 Chronic obstructive pulmonary disease, unspecified: Secondary | ICD-10-CM | POA: Diagnosis not present

## 2016-01-01 DIAGNOSIS — I10 Essential (primary) hypertension: Secondary | ICD-10-CM | POA: Diagnosis not present

## 2016-01-01 DIAGNOSIS — M1710 Unilateral primary osteoarthritis, unspecified knee: Secondary | ICD-10-CM | POA: Diagnosis not present

## 2016-01-01 DIAGNOSIS — G4700 Insomnia, unspecified: Secondary | ICD-10-CM | POA: Diagnosis not present

## 2016-01-01 DIAGNOSIS — J449 Chronic obstructive pulmonary disease, unspecified: Secondary | ICD-10-CM | POA: Diagnosis not present

## 2016-01-10 DIAGNOSIS — Z1231 Encounter for screening mammogram for malignant neoplasm of breast: Secondary | ICD-10-CM | POA: Diagnosis not present

## 2016-01-24 DIAGNOSIS — Z1389 Encounter for screening for other disorder: Secondary | ICD-10-CM | POA: Diagnosis not present

## 2016-01-24 DIAGNOSIS — Z7189 Other specified counseling: Secondary | ICD-10-CM | POA: Diagnosis not present

## 2016-01-24 DIAGNOSIS — Z1211 Encounter for screening for malignant neoplasm of colon: Secondary | ICD-10-CM | POA: Diagnosis not present

## 2016-01-24 DIAGNOSIS — Z299 Encounter for prophylactic measures, unspecified: Secondary | ICD-10-CM | POA: Diagnosis not present

## 2016-01-24 DIAGNOSIS — E7800 Pure hypercholesterolemia, unspecified: Secondary | ICD-10-CM | POA: Diagnosis not present

## 2016-01-24 DIAGNOSIS — Z6822 Body mass index (BMI) 22.0-22.9, adult: Secondary | ICD-10-CM | POA: Diagnosis not present

## 2016-01-24 DIAGNOSIS — R600 Localized edema: Secondary | ICD-10-CM | POA: Diagnosis not present

## 2016-01-24 DIAGNOSIS — Z0000 Encounter for general adult medical examination without abnormal findings: Secondary | ICD-10-CM | POA: Diagnosis not present

## 2016-01-24 DIAGNOSIS — M1990 Unspecified osteoarthritis, unspecified site: Secondary | ICD-10-CM | POA: Diagnosis not present

## 2016-01-24 DIAGNOSIS — R5383 Other fatigue: Secondary | ICD-10-CM | POA: Diagnosis not present

## 2016-01-25 DIAGNOSIS — Z79899 Other long term (current) drug therapy: Secondary | ICD-10-CM | POA: Diagnosis not present

## 2016-01-25 DIAGNOSIS — E7800 Pure hypercholesterolemia, unspecified: Secondary | ICD-10-CM | POA: Diagnosis not present

## 2016-01-25 DIAGNOSIS — R5383 Other fatigue: Secondary | ICD-10-CM | POA: Diagnosis not present

## 2016-02-17 DIAGNOSIS — W1839XA Other fall on same level, initial encounter: Secondary | ICD-10-CM | POA: Diagnosis not present

## 2016-02-17 DIAGNOSIS — I252 Old myocardial infarction: Secondary | ICD-10-CM | POA: Diagnosis not present

## 2016-02-17 DIAGNOSIS — M25571 Pain in right ankle and joints of right foot: Secondary | ICD-10-CM | POA: Diagnosis not present

## 2016-02-17 DIAGNOSIS — S93601A Unspecified sprain of right foot, initial encounter: Secondary | ICD-10-CM | POA: Diagnosis not present

## 2016-02-17 DIAGNOSIS — E785 Hyperlipidemia, unspecified: Secondary | ICD-10-CM | POA: Diagnosis not present

## 2016-02-17 DIAGNOSIS — Z7982 Long term (current) use of aspirin: Secondary | ICD-10-CM | POA: Diagnosis not present

## 2016-02-17 DIAGNOSIS — M1990 Unspecified osteoarthritis, unspecified site: Secondary | ICD-10-CM | POA: Diagnosis not present

## 2016-02-17 DIAGNOSIS — I10 Essential (primary) hypertension: Secondary | ICD-10-CM | POA: Diagnosis not present

## 2016-02-17 DIAGNOSIS — S99921A Unspecified injury of right foot, initial encounter: Secondary | ICD-10-CM | POA: Diagnosis not present

## 2016-02-17 DIAGNOSIS — Z79899 Other long term (current) drug therapy: Secondary | ICD-10-CM | POA: Diagnosis not present

## 2016-02-17 DIAGNOSIS — F418 Other specified anxiety disorders: Secondary | ICD-10-CM | POA: Diagnosis not present

## 2016-02-17 DIAGNOSIS — S99911A Unspecified injury of right ankle, initial encounter: Secondary | ICD-10-CM | POA: Diagnosis not present

## 2016-02-25 DIAGNOSIS — M1711 Unilateral primary osteoarthritis, right knee: Secondary | ICD-10-CM | POA: Diagnosis not present

## 2016-02-25 DIAGNOSIS — M797 Fibromyalgia: Secondary | ICD-10-CM | POA: Diagnosis not present

## 2016-02-25 DIAGNOSIS — Z79899 Other long term (current) drug therapy: Secondary | ICD-10-CM | POA: Diagnosis not present

## 2016-02-25 DIAGNOSIS — M2550 Pain in unspecified joint: Secondary | ICD-10-CM | POA: Diagnosis not present

## 2016-02-25 DIAGNOSIS — G8929 Other chronic pain: Secondary | ICD-10-CM | POA: Diagnosis not present

## 2016-02-25 DIAGNOSIS — M25561 Pain in right knee: Secondary | ICD-10-CM | POA: Diagnosis not present

## 2016-03-04 DIAGNOSIS — I2510 Atherosclerotic heart disease of native coronary artery without angina pectoris: Secondary | ICD-10-CM | POA: Diagnosis not present

## 2016-03-04 DIAGNOSIS — J449 Chronic obstructive pulmonary disease, unspecified: Secondary | ICD-10-CM | POA: Diagnosis not present

## 2016-03-04 DIAGNOSIS — Z299 Encounter for prophylactic measures, unspecified: Secondary | ICD-10-CM | POA: Diagnosis not present

## 2016-03-04 DIAGNOSIS — M545 Low back pain: Secondary | ICD-10-CM | POA: Diagnosis not present

## 2016-03-19 DIAGNOSIS — M797 Fibromyalgia: Secondary | ICD-10-CM | POA: Diagnosis not present

## 2016-03-19 DIAGNOSIS — G8929 Other chronic pain: Secondary | ICD-10-CM | POA: Diagnosis not present

## 2016-03-19 DIAGNOSIS — M2550 Pain in unspecified joint: Secondary | ICD-10-CM | POA: Diagnosis not present

## 2016-03-19 DIAGNOSIS — Z79891 Long term (current) use of opiate analgesic: Secondary | ICD-10-CM | POA: Diagnosis not present

## 2016-04-01 DIAGNOSIS — R350 Frequency of micturition: Secondary | ICD-10-CM | POA: Diagnosis not present

## 2016-04-01 DIAGNOSIS — I10 Essential (primary) hypertension: Secondary | ICD-10-CM | POA: Diagnosis not present

## 2016-04-01 DIAGNOSIS — M797 Fibromyalgia: Secondary | ICD-10-CM | POA: Diagnosis not present

## 2016-04-01 DIAGNOSIS — M545 Low back pain: Secondary | ICD-10-CM | POA: Diagnosis not present

## 2016-04-22 DIAGNOSIS — G8929 Other chronic pain: Secondary | ICD-10-CM | POA: Diagnosis not present

## 2016-04-22 DIAGNOSIS — M797 Fibromyalgia: Secondary | ICD-10-CM | POA: Diagnosis not present

## 2016-04-22 DIAGNOSIS — M2550 Pain in unspecified joint: Secondary | ICD-10-CM | POA: Diagnosis not present

## 2016-04-22 DIAGNOSIS — Z79891 Long term (current) use of opiate analgesic: Secondary | ICD-10-CM | POA: Diagnosis not present

## 2016-04-22 DIAGNOSIS — M47816 Spondylosis without myelopathy or radiculopathy, lumbar region: Secondary | ICD-10-CM | POA: Diagnosis not present

## 2016-05-14 DIAGNOSIS — Z299 Encounter for prophylactic measures, unspecified: Secondary | ICD-10-CM | POA: Diagnosis not present

## 2016-05-14 DIAGNOSIS — R630 Anorexia: Secondary | ICD-10-CM | POA: Diagnosis not present

## 2016-05-14 DIAGNOSIS — M1710 Unilateral primary osteoarthritis, unspecified knee: Secondary | ICD-10-CM | POA: Diagnosis not present

## 2016-05-14 DIAGNOSIS — J449 Chronic obstructive pulmonary disease, unspecified: Secondary | ICD-10-CM | POA: Diagnosis not present

## 2016-05-14 DIAGNOSIS — R110 Nausea: Secondary | ICD-10-CM | POA: Diagnosis not present

## 2016-05-16 DIAGNOSIS — Z23 Encounter for immunization: Secondary | ICD-10-CM | POA: Diagnosis not present

## 2016-05-22 DIAGNOSIS — G8929 Other chronic pain: Secondary | ICD-10-CM | POA: Diagnosis not present

## 2016-05-22 DIAGNOSIS — Z79891 Long term (current) use of opiate analgesic: Secondary | ICD-10-CM | POA: Diagnosis not present

## 2016-05-22 DIAGNOSIS — M797 Fibromyalgia: Secondary | ICD-10-CM | POA: Diagnosis not present

## 2016-05-22 DIAGNOSIS — M47816 Spondylosis without myelopathy or radiculopathy, lumbar region: Secondary | ICD-10-CM | POA: Diagnosis not present

## 2016-05-22 DIAGNOSIS — M2550 Pain in unspecified joint: Secondary | ICD-10-CM | POA: Diagnosis not present

## 2016-06-26 DIAGNOSIS — M797 Fibromyalgia: Secondary | ICD-10-CM | POA: Diagnosis not present

## 2016-06-26 DIAGNOSIS — F329 Major depressive disorder, single episode, unspecified: Secondary | ICD-10-CM | POA: Diagnosis not present

## 2016-06-26 DIAGNOSIS — Z299 Encounter for prophylactic measures, unspecified: Secondary | ICD-10-CM | POA: Diagnosis not present

## 2016-06-26 DIAGNOSIS — I10 Essential (primary) hypertension: Secondary | ICD-10-CM | POA: Diagnosis not present

## 2016-06-26 DIAGNOSIS — Z6823 Body mass index (BMI) 23.0-23.9, adult: Secondary | ICD-10-CM | POA: Diagnosis not present

## 2016-07-24 DIAGNOSIS — M2550 Pain in unspecified joint: Secondary | ICD-10-CM | POA: Diagnosis not present

## 2016-07-24 DIAGNOSIS — Z79891 Long term (current) use of opiate analgesic: Secondary | ICD-10-CM | POA: Diagnosis not present

## 2016-07-24 DIAGNOSIS — G8929 Other chronic pain: Secondary | ICD-10-CM | POA: Diagnosis not present

## 2016-07-24 DIAGNOSIS — M47816 Spondylosis without myelopathy or radiculopathy, lumbar region: Secondary | ICD-10-CM | POA: Diagnosis not present

## 2016-07-24 DIAGNOSIS — M797 Fibromyalgia: Secondary | ICD-10-CM | POA: Diagnosis not present

## 2016-07-29 DIAGNOSIS — M1990 Unspecified osteoarthritis, unspecified site: Secondary | ICD-10-CM | POA: Diagnosis not present

## 2016-07-29 DIAGNOSIS — Z299 Encounter for prophylactic measures, unspecified: Secondary | ICD-10-CM | POA: Diagnosis not present

## 2016-07-29 DIAGNOSIS — J069 Acute upper respiratory infection, unspecified: Secondary | ICD-10-CM | POA: Diagnosis not present

## 2016-07-29 DIAGNOSIS — Z87891 Personal history of nicotine dependence: Secondary | ICD-10-CM | POA: Diagnosis not present

## 2016-07-29 DIAGNOSIS — I10 Essential (primary) hypertension: Secondary | ICD-10-CM | POA: Diagnosis not present

## 2016-07-29 DIAGNOSIS — Z713 Dietary counseling and surveillance: Secondary | ICD-10-CM | POA: Diagnosis not present

## 2016-07-29 DIAGNOSIS — J449 Chronic obstructive pulmonary disease, unspecified: Secondary | ICD-10-CM | POA: Diagnosis not present

## 2017-02-05 DIAGNOSIS — Z79899 Other long term (current) drug therapy: Secondary | ICD-10-CM | POA: Diagnosis not present

## 2017-02-05 DIAGNOSIS — E7800 Pure hypercholesterolemia, unspecified: Secondary | ICD-10-CM | POA: Diagnosis not present

## 2017-02-05 DIAGNOSIS — R5383 Other fatigue: Secondary | ICD-10-CM | POA: Diagnosis not present

## 2017-02-26 ENCOUNTER — Encounter (INDEPENDENT_AMBULATORY_CARE_PROVIDER_SITE_OTHER): Payer: Self-pay | Admitting: Internal Medicine

## 2017-02-26 ENCOUNTER — Encounter (INDEPENDENT_AMBULATORY_CARE_PROVIDER_SITE_OTHER): Payer: Self-pay

## 2017-03-04 ENCOUNTER — Other Ambulatory Visit: Payer: Self-pay

## 2017-03-04 DIAGNOSIS — I6521 Occlusion and stenosis of right carotid artery: Secondary | ICD-10-CM

## 2017-03-20 ENCOUNTER — Other Ambulatory Visit (INDEPENDENT_AMBULATORY_CARE_PROVIDER_SITE_OTHER): Payer: Self-pay | Admitting: Internal Medicine

## 2017-03-20 ENCOUNTER — Telehealth (INDEPENDENT_AMBULATORY_CARE_PROVIDER_SITE_OTHER): Payer: Self-pay | Admitting: Internal Medicine

## 2017-03-20 ENCOUNTER — Encounter (INDEPENDENT_AMBULATORY_CARE_PROVIDER_SITE_OTHER): Payer: Self-pay | Admitting: Internal Medicine

## 2017-03-20 ENCOUNTER — Ambulatory Visit (INDEPENDENT_AMBULATORY_CARE_PROVIDER_SITE_OTHER): Payer: Medicare HMO | Admitting: Internal Medicine

## 2017-03-20 VITALS — BP 100/62 | HR 64 | Temp 98.0°F | Ht 59.0 in | Wt 127.2 lb

## 2017-03-20 DIAGNOSIS — R195 Other fecal abnormalities: Secondary | ICD-10-CM | POA: Diagnosis not present

## 2017-03-20 DIAGNOSIS — R1030 Lower abdominal pain, unspecified: Secondary | ICD-10-CM

## 2017-03-20 DIAGNOSIS — Z86010 Personal history of colonic polyps: Secondary | ICD-10-CM

## 2017-03-20 MED ORDER — DICYCLOMINE HCL 10 MG PO CAPS: 10.0000 mg | ORAL_CAPSULE | Freq: Two times a day (BID) | ORAL | 3 refills | Status: DC

## 2017-03-20 NOTE — Progress Notes (Addendum)
Subjective:    Patient ID: [name], female    DOB: 27-Oct-1950, 66 y.o.   MRN: [id number]  HPI Referred by Dr. Woody Seller for abdominal pain. She was last seen by our office in 2015 and underwent an EGD. She tells me she has been having diarrhea. She says she has had symptoms for about a year or long.  She says her stools are normal now.  About every other week her stools will be loose. She says she has had fecal seepage.  She takes Lomotil as needed. There is no abdominal pain.  She has a BM daily, especially if she eats good.  Her appetite is not good but is better. No weight loss.  Her last colonoscopy was in 2011 under fluro. She underwent fluro for incomplete colonoscopy 4 weeks prior. Had 2 polyps which were adenomatous.   6/19509 H and H 14.2 and 40.8 Creatinine 0.77   4 cardiac stent and kidney stent and maintained on Plavix.    01/13/2014 EGD Impression: Erosive reflux esophagitis. Nonerosive antral gastritis. Biopsy taken for routine histology.  Review of Systems Past Medical History:  Diagnosis Date  . Anxiety   . Arthritis    Rhumatoid and Osteoarthritis  . Carotid artery disease (HCC)    Dr. Trula Slade, 60-79% RICA, s/p left CEA  . Coronary atherosclerosis of native coronary artery    DES RCA/circumflex 4/10, LVEF 65  . Depression   . Essential hypertension, benign   . Fibromyalgia   . GERD (gastroesophageal reflux disease)   . Hiatal hernia   . Hyperlipidemia   . NSTEMI (non-ST elevated myocardial infarction) ([place])    4/10  . Renal artery stenosis (HCC)    Left renal artery stent 12/13  . Restless leg syndrome   . Spondylosis without myelopathy   . Type 2 diabetes mellitus ([place])     Past Surgical History:  Procedure Laterality Date  . ABDOMINAL HYSTERECTOMY  1977  . BREAST LUMPECTOMY    . CAROTID ENDARTERECTOMY Left 02/28/2009  . CHOLECYSTECTOMY  1991  . COLONOSCOPY    . ESOPHAGOGASTRODUODENOSCOPY N/A 04/07/2013   Procedure: ESOPHAGOGASTRODUODENOSCOPY  (EGD);  Surgeon: Rogene Houston, MD;  Location: [facility];  Service: Endoscopy;  Laterality: N/A;  250  . ESOPHAGOGASTRODUODENOSCOPY N/A 02/02/2014   Procedure: ESOPHAGOGASTRODUODENOSCOPY (EGD);  Surgeon: Rogene Houston, MD;  Location: [facility];  Service: Endoscopy;  Laterality: N/A;  120  . EYE SURGERY     Catartact bilateral  . JOINT REPLACEMENT  March 2013   Left knee  . JOINT REPLACEMENT  2006   Left Knee  . kidney stent     Left December 2013  . LUMBAR DISC SURGERY     L5-S1  . MULTIPLE TOOTH EXTRACTIONS  Jan. 2015  . Neck Fusion  1996   C4 to C6  . PERCUTANEOUS STENT INTERVENTION Left 06/15/2012   Procedure: PERCUTANEOUS STENT INTERVENTION;  Surgeon: Serafina Mitchell, MD;  Location: [facility] CATH LAB;  Service: Cardiovascular;  Laterality: Left;  renal  . RENAL ANGIOGRAM N/A 06/15/2012   Procedure: RENAL ANGIOGRAM;  Surgeon: Serafina Mitchell, MD;  Location: [facility] CATH LAB;  Service: Cardiovascular;  Laterality: N/A;  . SPINE SURGERY  1994   Ruptured disc  . TONSILECTOMY, ADENOIDECTOMY, BILATERAL MYRINGOTOMY AND TUBES  1969  . TOTAL KNEE ARTHROPLASTY     Left  . TUBAL LIGATION      Allergies  Allergen Reactions  . Celecoxib Itching  . Codeine Itching  . Dicyclomine  Diarrhea    Made diarrhea worse  . Lamictal [Lamotrigine]   . Lyrica [Pregabalin] Other (See Comments)    REACTION: headaches,blurred vision  . Metoclopramide Hcl Other (See Comments)    Unknown  . Morphine And Related Other (See Comments)    Unknown  . Savella [Milnacipran Hcl] Other (See Comments)    Unknown   . Trazodone And Nefazodone Other (See Comments)    hallucinations  . Venlafaxine Other (See Comments)    Affected eyes  . Citalopram Palpitations    Heart rate increased  . Sertraline Hcl Other (See Comments)    Stomach upset    Current Outpatient Prescriptions on File Prior to Visit  Medication Sig Dispense Refill  . aspirin EC 81 MG tablet Take 81 mg by mouth daily.     . carisoprodol  (SOMA) 350 MG tablet Take 350 mg by mouth at bedtime.     . clopidogrel (PLAVIX) 75 MG tablet Take 75 mg by mouth daily.     . diazepam (VALIUM) 10 MG tablet Take 10 mg by mouth every 8 (eight) hours as needed. For anxiety    . fluticasone (FLONASE) 50 MCG/ACT nasal spray Place 2 sprays into both nostrils daily.    . folic acid (FOLVITE) 1 MG tablet Take 1 mg by mouth daily.      . metoprolol succinate (TOPROL-XL) 25 MG 24 hr tablet Take 25 mg by mouth every morning. Takes one hour after taking Maxzide    . nitroGLYCERIN (NITROSTAT) 0.4 MG SL tablet Place 0.4 mg under the tongue every 5 (five) minutes as needed. For chest pain    . ondansetron (ZOFRAN) 4 MG tablet Take 1 tablet (4 mg total) by mouth 2 (two) times daily as needed for nausea or vomiting. 20 tablet 0  . oxyCODONE-acetaminophen (PERCOCET) 10-325 MG per tablet Take 1 tablet by mouth every 6 (six) hours as needed. For pain    . potassium chloride (K-DUR) 10 MEQ tablet Take 10 mEq by mouth daily as needed. Use only when Potassium low. (Legs start cramping)    . simvastatin (ZOCOR) 20 MG tablet Take 20 mg by mouth daily.    Marland Kitchen triamterene-hydrochlorothiazide (MAXZIDE-25) 37.5-25 MG per tablet Take 0.5 tablets by mouth every morning.     . Vitamin D, Ergocalciferol, (DRISDOL) 50000 UNITS CAPS Take 50,000 Units by mouth every 7 (seven) days. Take on mondays     No current facility-administered medications on file prior to visit.         Objective:   Physical Exam Blood pressure 100/62, pulse 64, temperature 98 F (36.7 C), height 4\' 11"  (1.499 m), weight 127 lb 3.2 oz (57.7 kg). Alert and oriented. Skin warm and dry. Oral mucosa is moist.   . Sclera anicteric, conjunctivae is pink. Thyroid not enlarged. No cervical lymphadenopathy. Lungs clear. Heart regular rate and rhythm.  Abdomen is soft. Bowel sounds are positive. No hepatomegaly. No abdominal masses felt. Slight lower abdominal tenderness.  No edema to lower extremities.           Assessment & Plan:  Change in stool./IBS.    Colon polyps: adenomatous. Will schedule colonoscopy. Will get last colonoscopy from [facility] Increase fiber in your diet.  Rx for Dicyclomine sent to her pharmacy. ( she says she can take).  Get Fiber One Bars

## 2017-03-20 NOTE — Addendum Note (Signed)
Addended by: Butch Penny on: 03/20/2017 10:39 AM   Modules accepted: Orders

## 2017-03-20 NOTE — Patient Instructions (Addendum)
CT abdomen/pelvis with CM.  Fiber one  (one a day).  Rx for Dicyclomine 10mg  twice a day.

## 2017-03-20 NOTE — Telephone Encounter (Signed)
Ann, I cancelled CT. Will get colonoscopy. I have spoken with patient

## 2017-03-23 ENCOUNTER — Encounter (INDEPENDENT_AMBULATORY_CARE_PROVIDER_SITE_OTHER): Payer: Self-pay | Admitting: *Deleted

## 2017-03-23 ENCOUNTER — Telehealth (INDEPENDENT_AMBULATORY_CARE_PROVIDER_SITE_OTHER): Payer: Self-pay | Admitting: *Deleted

## 2017-03-23 DIAGNOSIS — Z1211 Encounter for screening for malignant neoplasm of colon: Secondary | ICD-10-CM | POA: Insufficient documentation

## 2017-03-23 DIAGNOSIS — Z86010 Personal history of colonic polyps: Secondary | ICD-10-CM | POA: Insufficient documentation

## 2017-03-23 MED ORDER — PEG 3350-KCL-NA BICARB-NACL 420 G PO SOLR: 4000.0000 mL | Freq: Once | ORAL | 0 refills | Status: AC

## 2017-03-23 NOTE — Telephone Encounter (Signed)
Patient needs trilyte 

## 2017-03-23 NOTE — Telephone Encounter (Signed)
Per Sharyn Lull with Dr Woody Seller it is ok for patient to stop Plavix 5 days & ASA 2 days prior to TCS sch'd 05/08/17, patient aware

## 2017-03-23 NOTE — Telephone Encounter (Signed)
TCS sch'd 05/08/17, patient aware, instructions mailed

## 2017-04-13 ENCOUNTER — Ambulatory Visit (INDEPENDENT_AMBULATORY_CARE_PROVIDER_SITE_OTHER): Payer: Medicare HMO | Admitting: Surgery

## 2017-04-13 ENCOUNTER — Ambulatory Visit (HOSPITAL_COMMUNITY)
Admission: RE | Admit: 2017-04-13 | Discharge: 2017-04-13 | Disposition: A | Discharge status: Home / Self Care | Payer: Medicare HMO | Source: Ambulatory Visit | Attending: Surgery | Admitting: Surgery

## 2017-04-13 ENCOUNTER — Encounter: Payer: Self-pay | Admitting: Surgery

## 2017-04-13 VITALS — BP 155/66 | HR 64 | Temp 97.7°F | Resp 16 | Wt 127.0 lb

## 2017-04-13 DIAGNOSIS — I6521 Occlusion and stenosis of right carotid artery: Secondary | ICD-10-CM | POA: Insufficient documentation

## 2017-04-13 DIAGNOSIS — I6523 Occlusion and stenosis of bilateral carotid arteries: Secondary | ICD-10-CM

## 2017-04-13 DIAGNOSIS — I701 Atherosclerosis of renal artery: Secondary | ICD-10-CM

## 2017-04-13 LAB — VAS US CAROTID
RCCAPDIAS: 24 cm/s
RIGHT CCA MID DIAS: 20 cm/s
RIGHT ECA DIAS: -28 cm/s
Right CCA prox sys: 160 cm/s
Right cca dist sys: -100 cm/s

## 2017-04-13 NOTE — Progress Notes (Signed)
[facility]  Patient name: [name] MRN: [id number] DOB: Jan 26, 1951 Sex: female   REASON FOR VISIT:    Follow up  HISOTRY OF PRESENT ILLNESS:    [name] is a 66 y.o. female returns today for follow-up.  On 06/15/2012 the patient underwent left renal artery stenting for hypertension on multiple medications.  She was found to have greater than 70% stenosis.  A 5 mm stent was placed.  There was no significant stenosis on the right side.  She has a history of a left carotid endarterectomy in 2010 for asymptomatic stenosis.  She returns today for follow-up.  It has been several years since she was last seen.  She has been dealing with some family issues.  She is asymptomatic.  She does not have any neurologic symptoms.  PAST MEDICAL HISTORY:   Past Medical History:  Diagnosis Date  . Anxiety   . Arthritis    Rhumatoid and Osteoarthritis  . Carotid artery disease (HCC)    Dr. Trula Slade, 60-79% RICA, s/p left CEA  . Coronary atherosclerosis of native coronary artery    DES RCA/circumflex 4/10, LVEF 65  . Depression   . Essential hypertension, benign   . Fibromyalgia   . GERD (gastroesophageal reflux disease)   . Hiatal hernia   . Hyperlipidemia   . NSTEMI (non-ST elevated myocardial infarction) ([place])    4/10  . Renal artery stenosis (HCC)    Left renal artery stent 12/13  . Restless leg syndrome   . Spondylosis without myelopathy   . Type 2 diabetes mellitus ([place])      FAMILY HISTORY:   Family History  Problem Relation Age of Onset  . Other Mother        Cerebral hemorrage  . Heart disease Mother   . Heart attack Mother   . Hyperlipidemia Mother   . Heart disease Father   . Heart attack Father   . Hyperlipidemia Father   . Heart disease Brother        Heart Disease before age 32  . Diabetes Brother   . Hypertension Brother   . Heart attack Brother   . Cancer Sister   . Arthritis Sister    . Heart disease Sister   . Heart disease Daughter 29       Heart Disease before age 79  . Heart attack Daughter   . Hypertension Daughter   . Cancer Sister   . Arthritis Sister   . Diabetes Brother        Varicose  Veins  . Peripheral vascular disease Brother   . Diabetes Brother     SOCIAL HISTORY:   Social History  Substance Use Topics  . Smoking status: Former Smoker    Packs/day: 1.00    Years: 45.00    Types: Cigarettes    Quit date: 03/14/2014  . Smokeless tobacco: Never Used     Comment: 1 cigarette a day  . Alcohol use No     ALLERGIES:   Allergies  Allergen Reactions  . Celecoxib Itching  . Codeine Itching  . Dicyclomine Diarrhea    Made diarrhea worse  . Lamictal [Lamotrigine]   . Lyrica [Pregabalin] Other (See Comments)    REACTION: headaches,blurred vision  . Metoclopramide Hcl Other (See Comments)    Unknown  . Morphine And Related Other (See Comments)    Unknown  . Savella [Milnacipran Hcl] Other (See Comments)    Unknown   . Trazodone  And Nefazodone Other (See Comments)    hallucinations  . Venlafaxine Other (See Comments)    Affected eyes  . Citalopram Palpitations    Heart rate increased  . Sertraline Hcl Other (See Comments)    Stomach upset     CURRENT MEDICATIONS:   Current Outpatient Prescriptions  Medication Sig Dispense Refill  . aspirin EC 81 MG tablet Take 81 mg by mouth daily.     . carisoprodol (SOMA) 350 MG tablet Take 350 mg by mouth at bedtime.     . carisoprodol-aspirin (SOMA COMPOUND) 200-325 MG tablet Take 1 tablet by mouth 4 (four) times daily as needed for muscle spasms.    . clopidogrel (PLAVIX) 75 MG tablet Take 75 mg by mouth daily.     . diazepam (VALIUM) 10 MG tablet Take 10 mg by mouth every 8 (eight) hours as needed. For anxiety    . diphenoxylate-atropine (LOMOTIL) 2.5-0.025 MG tablet Take by mouth 4 (four) times daily as needed for diarrhea or loose stools.    . fluticasone (FLONASE) 50 MCG/ACT nasal spray  Place 2 sprays into both nostrils daily.    . folic acid (FOLVITE) 1 MG tablet Take 1 mg by mouth daily.      . metoprolol succinate (TOPROL-XL) 25 MG 24 hr tablet Take 25 mg by mouth every morning. Takes one hour after taking Maxzide    . nitroGLYCERIN (NITROSTAT) 0.4 MG SL tablet Place 0.4 mg under the tongue every 5 (five) minutes as needed. For chest pain    . Omega 3 1200 MG CAPS Take 1 capsule by mouth 2 (two) times daily.    Marland Kitchen omeprazole (PRILOSEC) 20 MG capsule Take 20 mg by mouth daily.    Marland Kitchen oxyCODONE-acetaminophen (PERCOCET) 10-325 MG per tablet Take 1 tablet by mouth every 6 (six) hours as needed. For pain    . potassium chloride (K-DUR) 10 MEQ tablet Take 10 mEq by mouth daily as needed. Use only when Potassium low. (Legs start cramping)    . promethazine (PHENERGAN) 25 MG tablet Take 25 mg by mouth 4 (four) times daily as needed.  2  . simvastatin (ZOCOR) 20 MG tablet Take 20 mg by mouth daily.    Marland Kitchen triamterene-hydrochlorothiazide (MAXZIDE-25) 37.5-25 MG per tablet Take 0.5 tablets by mouth every morning.     . Vitamin D, Ergocalciferol, (DRISDOL) 50000 UNITS CAPS Take 50,000 Units by mouth every 7 (seven) days. Take on mondays    . dicyclomine (BENTYL) 10 MG capsule Take 1 capsule (10 mg total) by mouth 2 (two) times daily before a meal. (Patient not taking: Reported on 04/13/2017) 60 capsule 3   No current facility-administered medications for this visit.     REVIEW OF SYSTEMS:   [X]  denotes positive finding, [ ]  denotes negative finding Cardiac  Comments:  Chest pain or chest pressure:    Shortness of breath upon exertion:    Short of breath when lying flat:    Irregular heart rhythm:        Vascular    Pain in calf, thigh, or hip brought on by ambulation: x   Pain in feet at night that wakes you up from your sleep:  x   Blood clot in your veins:    Leg swelling:  x       Pulmonary    Oxygen at home:    Productive cough:     Wheezing:         Neurologic    Sudden  weakness  in arms or legs:  x   Sudden numbness in arms or legs:     Sudden onset of difficulty speaking or slurred speech:    Temporary loss of vision in one eye:     Problems with dizziness:  x       Gastrointestinal    Blood in stool:     Vomited blood:         Genitourinary    Burning when urinating:     Blood in urine:        Psychiatric    [name] depression:         Hematologic    Bleeding problems:    Problems with blood clotting too easily:        Skin    Rashes or ulcers:        Constitutional    Fever or chills:      PHYSICAL EXAM:   Vitals:   04/13/17 1148  BP: (!) 155/66  Pulse: 64  Resp: 16  Temp: 97.7 F (36.5 C)  TempSrc: Oral  SpO2: 99%  Weight: 127 lb (57.6 kg)    GENERAL: The patient is a well-nourished female, in no acute distress. The vital signs are documented above. CARDIAC: There is a regular rate and rhythm. PULMONARY: Non-labored respirations MUSCULOSKELETAL: There are no [name] deformities or cyanosis. NEUROLOGIC: No focal weakness or paresthesias are detected. SKIN: There are no ulcers or rashes noted. PSYCHIATRIC: The patient has a normal affect.  STUDIES:   Duplex ultrasound was ordered and reviewed today.  This ultrasound states that there is 1-39 percent right carotid stenosis and greater than 50% external carotid stenosis.  Prior ultrasound in our office documented 60-79 percent right carotid stenosis.  Most recent outside ultrasound documented a greater than 70% right carotid stenosis.  MEDICAL ISSUES:   Carotid disease: The patient has had a discrepancy on her ultrasound findings.  Therefore, I am going to send her for a confirmatory CT angiogram to determine the degree of stenosis within her right carotid artery.  Renal artery stenosis: The patient has a history of a left renal artery stent.  This is not been imaged and many years.  When she returns for discussions of her CT angiogram of the neck, I will get a renal artery  duplex.  She should follow up in 2-3 weeks.    Heather Major, MD [facility] [phone] Pager [phone]

## 2017-04-14 ENCOUNTER — Other Ambulatory Visit: Payer: Self-pay

## 2017-04-14 DIAGNOSIS — Z01812 Encounter for preprocedural laboratory examination: Secondary | ICD-10-CM

## 2017-04-14 NOTE — Addendum Note (Signed)
Addended by: Lianne Cure A on: 04/14/2017 11:10 AM   Modules accepted: Orders

## 2017-04-14 NOTE — Addendum Note (Signed)
Addended by: Lianne Cure A on: 04/14/2017 11:23 AM   Modules accepted: Orders

## 2017-04-24 ENCOUNTER — Other Ambulatory Visit: Payer: Self-pay

## 2017-04-24 DIAGNOSIS — Z01812 Encounter for preprocedural laboratory examination: Secondary | ICD-10-CM

## 2017-05-08 ENCOUNTER — Encounter (HOSPITAL_COMMUNITY): Payer: Self-pay | Admitting: *Deleted

## 2017-05-08 ENCOUNTER — Encounter (HOSPITAL_COMMUNITY)
Admission: RE | Disposition: A | Discharge status: Home / Self Care | Payer: Self-pay | Source: Ambulatory Visit | Attending: Internal Medicine

## 2017-05-08 ENCOUNTER — Ambulatory Visit (HOSPITAL_COMMUNITY)
Admission: RE | Admit: 2017-05-08 | Discharge: 2017-05-08 | Disposition: A | Discharge status: Home / Self Care | Payer: Medicare HMO | Source: Ambulatory Visit | Attending: Internal Medicine | Admitting: Internal Medicine

## 2017-05-08 DIAGNOSIS — F329 Major depressive disorder, single episode, unspecified: Secondary | ICD-10-CM | POA: Diagnosis not present

## 2017-05-08 DIAGNOSIS — D123 Benign neoplasm of transverse colon: Secondary | ICD-10-CM | POA: Diagnosis not present

## 2017-05-08 DIAGNOSIS — Z885 Allergy status to narcotic agent status: Secondary | ICD-10-CM | POA: Insufficient documentation

## 2017-05-08 DIAGNOSIS — Z860101 Personal history of adenomatous and serrated colon polyps: Secondary | ICD-10-CM | POA: Insufficient documentation

## 2017-05-08 DIAGNOSIS — I2510 Atherosclerotic heart disease of native coronary artery without angina pectoris: Secondary | ICD-10-CM | POA: Diagnosis not present

## 2017-05-08 DIAGNOSIS — D122 Benign neoplasm of ascending colon: Secondary | ICD-10-CM | POA: Diagnosis not present

## 2017-05-08 DIAGNOSIS — Z87891 Personal history of nicotine dependence: Secondary | ICD-10-CM | POA: Insufficient documentation

## 2017-05-08 DIAGNOSIS — Z888 Allergy status to other drugs, medicaments and biological substances status: Secondary | ICD-10-CM | POA: Insufficient documentation

## 2017-05-08 DIAGNOSIS — I252 Old myocardial infarction: Secondary | ICD-10-CM | POA: Insufficient documentation

## 2017-05-08 DIAGNOSIS — I10 Essential (primary) hypertension: Secondary | ICD-10-CM | POA: Diagnosis not present

## 2017-05-08 DIAGNOSIS — Z7951 Long term (current) use of inhaled steroids: Secondary | ICD-10-CM | POA: Insufficient documentation

## 2017-05-08 DIAGNOSIS — Z86010 Personal history of colonic polyps: Secondary | ICD-10-CM

## 2017-05-08 DIAGNOSIS — I6529 Occlusion and stenosis of unspecified carotid artery: Secondary | ICD-10-CM | POA: Insufficient documentation

## 2017-05-08 DIAGNOSIS — Z7902 Long term (current) use of antithrombotics/antiplatelets: Secondary | ICD-10-CM | POA: Diagnosis not present

## 2017-05-08 DIAGNOSIS — Z96652 Presence of left artificial knee joint: Secondary | ICD-10-CM | POA: Diagnosis not present

## 2017-05-08 DIAGNOSIS — Z79899 Other long term (current) drug therapy: Secondary | ICD-10-CM | POA: Insufficient documentation

## 2017-05-08 DIAGNOSIS — M1990 Unspecified osteoarthritis, unspecified site: Secondary | ICD-10-CM | POA: Diagnosis not present

## 2017-05-08 DIAGNOSIS — E785 Hyperlipidemia, unspecified: Secondary | ICD-10-CM | POA: Diagnosis not present

## 2017-05-08 DIAGNOSIS — E119 Type 2 diabetes mellitus without complications: Secondary | ICD-10-CM | POA: Diagnosis not present

## 2017-05-08 DIAGNOSIS — K219 Gastro-esophageal reflux disease without esophagitis: Secondary | ICD-10-CM | POA: Insufficient documentation

## 2017-05-08 DIAGNOSIS — Z1211 Encounter for screening for malignant neoplasm of colon: Secondary | ICD-10-CM | POA: Insufficient documentation

## 2017-05-08 DIAGNOSIS — F419 Anxiety disorder, unspecified: Secondary | ICD-10-CM | POA: Diagnosis not present

## 2017-05-08 DIAGNOSIS — M797 Fibromyalgia: Secondary | ICD-10-CM | POA: Diagnosis not present

## 2017-05-08 HISTORY — PX: COLONOSCOPY: SHX5424

## 2017-05-08 HISTORY — PX: POLYPECTOMY: SHX5525

## 2017-05-08 SURGERY — COLONOSCOPY
Anesthesia: Moderate Sedation

## 2017-05-08 MED ORDER — MIDAZOLAM HCL 5 MG/5ML IJ SOLN
INTRAMUSCULAR | Status: DC | PRN
  Administered 2017-05-08 (×4): 2 mg via INTRAVENOUS
  Administered 2017-05-08: 1 mg via INTRAVENOUS
  Administered 2017-05-08 (×3): 2 mg via INTRAVENOUS

## 2017-05-08 MED ORDER — MEPERIDINE HCL 50 MG/ML IJ SOLN
INTRAMUSCULAR | Status: AC
  Filled 2017-05-08: qty 1

## 2017-05-08 MED ORDER — SODIUM CHLORIDE 0.9 % IV SOLN
INTRAVENOUS | Status: DC
  Administered 2017-05-08: 1000 mL via INTRAVENOUS

## 2017-05-08 MED ORDER — MIDAZOLAM HCL 5 MG/5ML IJ SOLN
INTRAMUSCULAR | Status: AC
  Filled 2017-05-08: qty 10

## 2017-05-08 MED ORDER — MEPERIDINE HCL 50 MG/ML IJ SOLN
INTRAMUSCULAR | Status: DC | PRN
  Administered 2017-05-08 (×4): 25 mg via INTRAVENOUS

## 2017-05-08 MED ORDER — STERILE WATER FOR IRRIGATION IR SOLN
Status: DC | PRN
  Administered 2017-05-08: 12:00:00

## 2017-05-08 MED ORDER — SODIUM CHLORIDE 0.9% FLUSH
INTRAVENOUS | Status: AC
  Filled 2017-05-08: qty 10

## 2017-05-08 MED ORDER — PROMETHAZINE HCL 25 MG/ML IJ SOLN
INTRAMUSCULAR | Status: DC | PRN
  Administered 2017-05-08: 6.25 mg via INTRAVENOUS

## 2017-05-08 MED ORDER — MIDAZOLAM HCL 5 MG/5ML IJ SOLN
INTRAMUSCULAR | Status: AC
  Filled 2017-05-08: qty 5

## 2017-05-08 MED ORDER — PROMETHAZINE HCL 25 MG/ML IJ SOLN
INTRAMUSCULAR | Status: AC
  Filled 2017-05-08: qty 1

## 2017-05-08 NOTE — Op Note (Signed)
[facility] Patient Name: [name] Procedure Date: 05/08/2017 11:43 AM MRN: [id number] Date of Birth: [birth year] Attending MD: Hildred Laser , MD CSN: [id number] Age: 66 Admit Type: Outpatient Procedure:                Colonoscopy Indications:              High risk colon cancer surveillance: Personal                            history of colonic polyps Providers:                Hildred Laser, MD, Otis Peak B. Gwenlyn Perking RN, RN, Selena Lesser, Randa Spike, Technician Referring MD:             Glenda Chroman Medicines:                Promethazine 6.25 mg IV, Meperidine 100 mg IV,                            Midazolam 15 mg IV Complications:            No immediate complications. Estimated Blood Loss:     Estimated blood loss was minimal. Procedure:                Pre-Anesthesia Assessment:                           - Prior to the procedure, a History and Physical                            was performed, and patient medications and                            allergies were reviewed. The patient's tolerance of                            previous anesthesia was also reviewed. The risks                            and benefits of the procedure and the sedation                            options and risks were discussed with the patient.                            All questions were answered, and informed consent                            was obtained. Prior Anticoagulants: The patient                            last took Plavix (clopidogrel) 7 days prior to the  procedure. ASA Grade Assessment: III - A patient                            with severe systemic disease. After reviewing the                            risks and benefits, the patient was deemed in                            satisfactory condition to undergo the procedure.                           After obtaining informed consent, the colonoscope    was passed under direct vision. Throughout the                            procedure, the patient's blood pressure, pulse, and                            oxygen saturations were monitored continuously. The                            EC-[id number] ([id number]) scope was introduced through                            the anus and advanced to the the ileocecal valve.                            The colonoscopy was technically difficult and                            complex due to inadequate bowel prep, significant                            looping and a tortuous colon. Successful completion                            of the procedure was aided by changing the patient                            to a supine position, using manual pressure,                            withdrawing and reinserting the scope and colowrap.                            The patient tolerated the procedure well. The                            quality of the bowel preparation was poor. The                            ileocecal valve and the rectum were photographed. Scope In: 12:13:48 PM  Scope Out: 12:52:42 PM Scope Withdrawal Time: 0 hours 3 minutes 43 seconds  Total Procedure Duration: 0 hours 38 minutes 54 seconds  Findings:      The perianal and digital rectal examinations were normal.      Two sessile polyps were found in the hepatic flexure and ascending       colon. The polyps were small in size. These were biopsied with a cold       forceps for histology. The pathology specimen was placed into Bottle       Number 1.      A 4 mm polyp was found in the ascending colon. The polyp was sessile.       The polyp was removed with a cold snare. Resection and retrieval were       complete. The pathology specimen was placed into Bottle Number 1.      No additional abnormalities were found on retroflexion. Impression:               - Preparation of the colon was poor.                           - Two small polyps at the hepatic  flexure and in                            the ascending colon. Biopsied.                           - One 4 mm polyp in the ascending colon, removed                            with a cold snare. Resected and retrieved.                           comment: poor prep. Blunt end of the cecum was not                            seen. Moderate Sedation:      Moderate (conscious) sedation was administered by the endoscopy nurse       and supervised by the endoscopist. The following parameters were       monitored: oxygen saturation, heart rate, blood pressure, CO2       capnography and response to care. Total physician intraservice time was       49 minutes. Recommendation:           - Patient has a contact number available for                            emergencies. The signs and symptoms of potential                            delayed complications were discussed with the                            patient. Return to normal activities tomorrow.  Written discharge instructions were provided to the                            patient.                           - Resume previous diet today.                           - Continue present medications.                           - Resume Plavix (clopidogrel) at prior dose                            tomorrow.                           - Await pathology results.                           - Repeat colonoscopy in 1 year for surveillance. Procedure Code(s):        --- Professional ---                           [id number], Colonoscopy, flexible; with removal of                            tumor(s), polyp(s), or other lesion(s) by snare                            technique                           45380, 59, Colonoscopy, flexible; with biopsy,                            single or multiple                           99152, Moderate sedation services provided by the                            same physician or other qualified health care                             professional performing the diagnostic or                            therapeutic service that the sedation supports,                            requiring the presence of an independent trained                            observer to assist in the monitoring of the  patient's level of consciousness and physiological                            status; initial 15 minutes of intraservice time,                            patient age 71 years or older                           [phone], Moderate sedation services; each additional                            15 minutes intraservice time                           99153, Moderate sedation services; each additional                            15 minutes intraservice time Diagnosis Code(s):        --- Professional ---                           Z86.010, Personal history of colonic polyps                           D12.3, Benign neoplasm of transverse colon (hepatic                            flexure or splenic flexure)                           D12.2, Benign neoplasm of ascending colon CPT copyright 2016 American Medical Association. All rights reserved. The codes documented in this report are preliminary and upon coder review may  be revised to meet current compliance requirements. Hildred Laser, MD Hildred Laser, MD 05/08/2017 1:02:32 PM This report has been signed electronically. Number of Addenda: 0

## 2017-05-08 NOTE — H&P (Signed)
[name] is an 66 y.o. female.   Chief Complaint: Patient is here for colonoscopy. HPI: Patient is 66 year old Caucasian female with history of colonic adenomas and is here for surveillance colonoscopy. She has history of IBS and has had intermittent diarrhea with urgency and abdominal pain. She denies constipation or rectal bleeding. She has very redundant colon. First colonoscopy was incomplete and second one was complete under fluoroscopy. Plavix is on hold for the procedure. Family history is negative for CRC.  Past Medical History:  Diagnosis Date  . Anxiety   . Arthritis    Rhumatoid and Osteoarthritis  . Carotid artery disease (HCC)    Dr. Trula Slade, 60-79% RICA, s/p left CEA  . Coronary atherosclerosis of native coronary artery    DES RCA/circumflex 4/10, LVEF 65  . Depression   . Essential hypertension, benign   . Fibromyalgia   . GERD (gastroesophageal reflux disease)   . Hiatal hernia   . Hyperlipidemia   . NSTEMI (non-ST elevated myocardial infarction) ([place])    4/10  . Renal artery stenosis (HCC)    Left renal artery stent 12/13  . Restless leg syndrome   . Spondylosis without myelopathy   . Type 2 diabetes mellitus ([place])     Past Surgical History:  Procedure Laterality Date  . ABDOMINAL HYSTERECTOMY  1977  . BREAST LUMPECTOMY    . CAROTID ENDARTERECTOMY Left 02/28/2009  . CHOLECYSTECTOMY  1991  . COLONOSCOPY    . ESOPHAGOGASTRODUODENOSCOPY N/A 04/07/2013   Procedure: ESOPHAGOGASTRODUODENOSCOPY (EGD);  Surgeon: Rogene Houston, MD;  Location: [facility]O [facility];  Service: Endoscopy;  Laterality: N/A;  250  . ESOPHAGOGASTRODUODENOSCOPY N/A 02/02/2014   Procedure: ESOPHAGOGASTRODUODENOSCOPY (EGD);  Surgeon: Rogene Houston, MD;  Location: [facility] ENDO SUITE;  Service: Endoscopy;  Laterality: N/A;  120  . EYE SURGERY     Catartact bilateral  . JOINT REPLACEMENT  March 2013   Left knee  . JOINT REPLACEMENT  2006   Left Knee  . kidney stent     Left December 2013  .  LUMBAR DISC SURGERY     L5-S1  . MULTIPLE TOOTH EXTRACTIONS  Jan. 2015  . Neck Fusion  1996   C4 to C6  . PERCUTANEOUS STENT INTERVENTION Left 06/15/2012   Procedure: PERCUTANEOUS STENT INTERVENTION;  Surgeon: Serafina Mitchell, MD;  Location: [facility] CATH LAB;  Service: Cardiovascular;  Laterality: Left;  renal  . RENAL ANGIOGRAM N/A 06/15/2012   Procedure: RENAL ANGIOGRAM;  Surgeon: Serafina Mitchell, MD;  Location: [facility] CATH LAB;  Service: Cardiovascular;  Laterality: N/A;  . SPINE SURGERY  1994   Ruptured disc  . TONSILECTOMY, ADENOIDECTOMY, BILATERAL MYRINGOTOMY AND TUBES  1969  . TOTAL KNEE ARTHROPLASTY     Left  . TUBAL LIGATION      Family History  Problem Relation Age of Onset  . Other Mother        Cerebral hemorrage  . Heart disease Mother   . Heart attack Mother   . Hyperlipidemia Mother   . Heart disease Father   . Heart attack Father   . Hyperlipidemia Father   . Heart disease Brother        Heart Disease before age 68  . Diabetes Brother   . Hypertension Brother   . Heart attack Brother   . Cancer Sister   . Arthritis Sister   . Heart disease Sister   . Heart disease Daughter 68       Heart Disease before age 68  .  Heart attack Daughter   . Hypertension Daughter   . Cancer Sister   . Arthritis Sister   . Diabetes Brother        Varicose  Veins  . Peripheral vascular disease Brother   . Diabetes Brother    Social History:  reports that she quit smoking about 3 years ago. Her smoking use included Cigarettes. She has a 45.00 pack-year smoking history. She has never used smokeless tobacco. She reports that she does not drink alcohol or use drugs.  Allergies:  Allergies  Allergen Reactions  . Celecoxib Itching  . Codeine Itching  . Dicyclomine Diarrhea and Other (See Comments)    Made diarrhea worse  . Lamictal [Lamotrigine] Other (See Comments)    Unknown  . Lyrica [Pregabalin] Other (See Comments)    REACTION: headaches,blurred vision  . Metoclopramide Hcl  Other (See Comments)    Unknown  . Morphine And Related Other (See Comments)    Patient states she is not allergic to morphine; states had morphine with no reaction  . Savella [Milnacipran Hcl] Other (See Comments)    Unknown   . Tape Other (See Comments)    Skin gets red and skin pulls off, paper tape only  . Trazodone And Nefazodone Other (See Comments)    hallucinations  . Venlafaxine Other (See Comments)    Affected eyes  . Citalopram Palpitations and Other (See Comments)    Heart rate increased  . Sertraline Hcl Other (See Comments)    Stomach upset    Medications Prior to Admission  Medication Sig Dispense Refill  . clopidogrel (PLAVIX) 75 MG tablet Take 75 mg by mouth at bedtime.     . cyclobenzaprine (FLEXERIL) 10 MG tablet Take 10 mg by mouth 3 (three) times daily as needed for muscle spasms.    . diazepam (VALIUM) 5 MG tablet Take 5 mg by mouth every 12 (twelve) hours as needed for anxiety.     . fluticasone (FLONASE) 50 MCG/ACT nasal spray Place 2 sprays into both nostrils daily.    . folic acid (FOLVITE) 1 MG tablet Take 1 mg by mouth daily.      . metoprolol succinate (TOPROL-XL) 25 MG 24 hr tablet Take 25 mg by mouth every morning. Takes 1 hour after taking Maxzide    . Omega 3 1200 MG CAPS Take 1,200 mg by mouth 2 (two) times daily.     Marland Kitchen omeprazole (PRILOSEC) 20 MG capsule Take 20 mg by mouth daily.    Marland Kitchen oxyCODONE-acetaminophen (PERCOCET) 7.5-325 MG tablet Take 1 tablet by mouth every 8 (eight) hours as needed for pain.     . potassium chloride (K-DUR) 10 MEQ tablet Take 10 mEq by mouth daily as needed (when potassium is low and legs start cramping).     . simvastatin (ZOCOR) 20 MG tablet Take 20 mg by mouth at bedtime.     . triamterene-hydrochlorothiazide (MAXZIDE-25) 37.5-25 MG per tablet Take 0.5 tablets by mouth every morning.     . Vitamin D, Ergocalciferol, (DRISDOL) 50000 UNITS CAPS Take 50,000 Units by mouth every Monday.     . dicyclomine (BENTYL) 10 MG  capsule Take 1 capsule (10 mg total) by mouth 2 (two) times daily before a meal. (Patient not taking: Reported on 04/13/2017) 60 capsule 3  . diphenoxylate-atropine (LOMOTIL) 2.5-0.025 MG tablet Take 1 tablet by mouth 4 (four) times daily as needed for diarrhea or loose stools.     . nitroGLYCERIN (NITROSTAT) 0.4 MG SL tablet Place 0.4  mg under the tongue every 5 (five) minutes as needed. For chest pain    . promethazine (PHENERGAN) 25 MG tablet Take 25 mg by mouth 4 (four) times daily as needed for nausea or vomiting.   2    No results found for this or any previous visit (from the past 48 hour(s)). No results found.  ROS  Blood pressure (!) 160/73, pulse 78, temperature 98.1 F (36.7 C), temperature source Oral, resp. rate 14, height 4\' 11"  (1.499 m), weight 127 lb (57.6 kg), SpO2 100 %. Physical Exam  Constitutional: She appears well-developed and well-nourished.  HENT:  Mouth/Throat: Oropharynx is clear and moist.  Eyes: Conjunctivae are normal. No scleral icterus.  Neck: No thyromegaly present.  Scar along left anterior neck.  Cardiovascular: Normal rate, regular rhythm and normal heart sounds.   No murmur heard. Respiratory: Effort normal and breath sounds normal.  GI:  Abdomen is symmetrical. Long right subcostal scar along with two vertical scars across lower abdomen. Mild generalized tenderness. No organomegaly or masses.  Musculoskeletal: She exhibits no edema.  Lymphadenopathy:    She has no cervical adenopathy.  Neurological: She is alert.  Skin: Skin is warm and dry.     Assessment/Plan History of colonic adenomas. Surveillance colonoscopy.  Hildred Laser, MD 05/08/2017, 11:56 AM

## 2017-05-08 NOTE — Discharge Instructions (Signed)
Resume clopidogrel on 05/09/2017. Resume other medications as before. Resume usual diet. No driving for 24 hours. Physician will call with biopsy results.   Colonoscopy, Adult, Care After This sheet gives you information about how to care for yourself after your procedure. Your doctor may also give you more specific instructions. If you have problems or questions, call your doctor. Follow these instructions at home: General instructions   For the first 24 hours after the procedure: ? Do not drive or use machinery. ? Do not sign important documents. ? Do not drink alcohol. ? Do your daily activities more slowly than normal. ? Eat foods that are soft and easy to digest. ? Rest often.  Take over-the-counter or prescription medicines only as told by your doctor.  It is up to you to get the results of your procedure. Ask your doctor, or the department performing the procedure, when your results will be ready. To help cramping and bloating:  Try walking around.  Put heat on your belly (abdomen) as told by your doctor. Use a heat source that your doctor recommends, such as a moist heat pack or a heating pad. ? Put a towel between your skin and the heat source. ? Leave the heat on for 20-30 minutes. ? Remove the heat if your skin turns bright red. This is especially important if you cannot feel pain, heat, or cold. You can get burned. Eating and drinking  Drink enough fluid to keep your pee (urine) clear or pale yellow.  Return to your normal diet as told by your doctor. Avoid heavy or fried foods that are hard to digest.  Avoid drinking alcohol for as long as told by your doctor. Contact a doctor if:  You have blood in your poop (stool) 2-3 days after the procedure. Get help right away if:  You have more than a small amount of blood in your poop.  You see large clumps of tissue (blood clots) in your poop.  Your belly is swollen.  You feel sick to your stomach  (nauseous).  You throw up (vomit).  You have a fever.  You have belly pain that gets worse, and medicine does not help your pain. This information is not intended to replace advice given to you by your health care provider. Make sure you discuss any questions you have with your health care provider. Document Released: 08/02/2010 Document Revised: 03/24/2016 Document Reviewed: 03/24/2016 Elsevier Interactive Patient Education  2017 [place].  Colon Polyps Polyps are tissue growths inside the body. Polyps can grow in many places, including the large intestine (colon). A polyp may be a round bump or a mushroom-shaped growth. You could have one polyp or several. Most colon polyps are noncancerous (benign). However, some colon polyps can become cancerous over time. What are the causes? The exact cause of colon polyps is not known. What increases the risk? This condition is more likely to develop in people who:  Have a family history of colon cancer or colon polyps.  Are older than 34 or older than 45 if they are African American.  Have inflammatory bowel disease, such as ulcerative colitis or Crohn disease.  Are overweight.  Smoke cigarettes.  Do not get enough exercise.  Drink too much alcohol.  Eat a diet that is: ? High in fat and red meat. ? Low in fiber.  Had childhood cancer that was treated with abdominal radiation.  What are the signs or symptoms? Most polyps do not cause symptoms. If you  have symptoms, they may include:  Blood coming from your rectum when having a bowel movement.  Blood in your stool.The stool may look dark red or black.  A change in bowel habits, such as constipation or diarrhea.  How is this diagnosed? This condition is diagnosed with a colonoscopy. This is a procedure that uses a lighted, flexible scope to look at the inside of your colon. How is this treated? Treatment for this condition involves removing any polyps that are found.  Those polyps will then be tested for cancer. If cancer is found, your health care provider will talk to you about options for colon cancer treatment. Follow these instructions at home: Diet  Eat plenty of fiber, such as fruits, vegetables, and whole grains.  Eat foods that are high in calcium and vitamin D, such as milk, cheese, yogurt, eggs, liver, fish, and broccoli.  Limit foods high in fat, red meats, and processed meats, such as hot dogs, sausage, bacon, and lunch meats.  Maintain a healthy weight, or lose weight if recommended by your health care provider. General instructions  Do not smoke cigarettes.  Do not drink alcohol excessively.  Keep all follow-up visits as told by your health care provider. This is important. This includes keeping regularly scheduled colonoscopies. Talk to your health care provider about when you need a colonoscopy.  Exercise every day or as told by your health care provider. Contact a health care provider if:  You have new or worsening bleeding during a bowel movement.  You have new or increased blood in your stool.  You have a change in bowel habits.  You unexpectedly lose weight. This information is not intended to replace advice given to you by your health care provider. Make sure you discuss any questions you have with your health care provider. Document Released: 03/26/2004 Document Revised: 12/06/2015 Document Reviewed: 05/21/2015 Elsevier Interactive Patient Education  [employer].

## 2017-05-11 ENCOUNTER — Ambulatory Visit (HOSPITAL_COMMUNITY)
Admission: RE | Admit: 2017-05-11 | Discharge: 2017-05-11 | Disposition: A | Discharge status: Home / Self Care | Payer: Medicare HMO | Source: Ambulatory Visit | Attending: Surgery | Admitting: Surgery

## 2017-05-11 DIAGNOSIS — I6523 Occlusion and stenosis of bilateral carotid arteries: Secondary | ICD-10-CM | POA: Diagnosis not present

## 2017-05-11 LAB — POCT I-STAT CREATININE: Creatinine, Ser: 1 mg/dL (ref 0.44–1.00)

## 2017-05-11 MED ORDER — IOPAMIDOL (ISOVUE-370) INJECTION 76%
80.0000 mL | Freq: Once | INTRAVENOUS | Status: AC | PRN
  Administered 2017-05-11: 80 mL via INTRAVENOUS

## 2017-05-12 ENCOUNTER — Telehealth (INDEPENDENT_AMBULATORY_CARE_PROVIDER_SITE_OTHER): Payer: Self-pay | Admitting: Internal Medicine

## 2017-05-12 NOTE — Telephone Encounter (Signed)
Patient called, lmoam that she had a colonoscopy on 05/08/17 and she is in pain.  She stated that she only has two of her pain pills left and she doesn't go to the pain doctor until 05/20/17 and she is going to need something for pain.  [phone]

## 2017-05-13 ENCOUNTER — Encounter (HOSPITAL_COMMUNITY): Payer: Self-pay | Admitting: Internal Medicine

## 2017-05-18 ENCOUNTER — Ambulatory Visit (HOSPITAL_COMMUNITY)
Admission: RE | Admit: 2017-05-18 | Discharge: 2017-05-18 | Disposition: A | Discharge status: Home / Self Care | Payer: Medicare HMO | Source: Ambulatory Visit | Attending: Surgery | Admitting: Surgery

## 2017-05-18 ENCOUNTER — Encounter: Payer: Self-pay | Admitting: *Deleted

## 2017-05-18 ENCOUNTER — Ambulatory Visit (INDEPENDENT_AMBULATORY_CARE_PROVIDER_SITE_OTHER): Payer: Medicare HMO | Admitting: Surgery

## 2017-05-18 ENCOUNTER — Encounter: Payer: Self-pay | Admitting: Surgery

## 2017-05-18 ENCOUNTER — Other Ambulatory Visit: Payer: Self-pay | Admitting: *Deleted

## 2017-05-18 VITALS — BP 186/79 | HR 70 | Temp 98.2°F | Resp 20 | Ht 59.0 in | Wt 126.0 lb

## 2017-05-18 DIAGNOSIS — I6523 Occlusion and stenosis of bilateral carotid arteries: Secondary | ICD-10-CM | POA: Diagnosis not present

## 2017-05-18 DIAGNOSIS — I701 Atherosclerosis of renal artery: Secondary | ICD-10-CM | POA: Insufficient documentation

## 2017-05-18 NOTE — H&P (View-Only) (Signed)
Vascular and Vein Specialist of [facility]  Patient name: [name] MRN: [id number] DOB: 17-Apr-1951 Sex: female   REASON FOR VISIT:    Follow up  HISOTRY OF PRESENT ILLNESS:   [name] is a 66 y.o. female returns today for follow-up.  On 06/15/2012 the patient underwent left renal artery stenting for hypertension on multiple medications.  She was found to have greater than 70% stenosis.  A 5 mm stent was placed.  There was no significant stenosis on the right side.  She has a history of a left carotid endarterectomy in 2010 for asymptomatic stenosis.  She states that her blood pressure has been more difficult to control recently.  She has also undergone colonoscopy with biopsy.  She is anxious about the results.  She states that she stopped smoking this morning  PAST MEDICAL HISTORY:   Past Medical History:  Diagnosis Date  . Anxiety   . Arthritis    Rhumatoid and Osteoarthritis  . Carotid artery disease (HCC)    Dr. Trula Slade, 60-79% RICA, s/p left CEA  . Coronary atherosclerosis of native coronary artery    DES RCA/circumflex 4/10, LVEF 65  . Depression   . Essential hypertension, benign   . Fibromyalgia   . GERD (gastroesophageal reflux disease)   . Hiatal hernia   . Hyperlipidemia   . NSTEMI (non-ST elevated myocardial infarction) ([place])    4/10  . Renal artery stenosis (HCC)    Left renal artery stent 12/13  . Restless leg syndrome   . Spondylosis without myelopathy   . Type 2 diabetes mellitus ([place])      FAMILY HISTORY:   Family History  Problem Relation Age of Onset  . Other Mother        Cerebral hemorrage  . Heart disease Mother   . Heart attack Mother   . Hyperlipidemia Mother   . Heart disease Father   . Heart attack Father   . Hyperlipidemia Father   . Heart disease Brother        Heart Disease before age 27  . Diabetes Brother   . Hypertension Brother   . Heart attack Brother   . Cancer Sister   .  Arthritis Sister   . Heart disease Sister   . Heart disease Daughter 65       Heart Disease before age 50  . Heart attack Daughter   . Hypertension Daughter   . Cancer Sister   . Arthritis Sister   . Diabetes Brother        Varicose  Veins  . Peripheral vascular disease Brother   . Diabetes Brother     SOCIAL HISTORY:   Social History   Tobacco Use  . Smoking status: Former Smoker    Packs/day: 1.00    Years: 45.00    Pack years: 45.00    Types: Cigarettes    Last attempt to quit: 03/14/2014    Years since quitting: 3.1  . Smokeless tobacco: Never Used  . Tobacco comment: 1 cigarette a day  Substance Use Topics  . Alcohol use: No    Alcohol/week: 0.0 oz     ALLERGIES:   Allergies  Allergen Reactions  . Celecoxib Itching  . Codeine Itching  . Dicyclomine Diarrhea and Other (See Comments)    Made diarrhea worse  . Lamictal [Lamotrigine] Other (See Comments)    Unknown  . Lyrica [Pregabalin] Other (See Comments)    REACTION: headaches,blurred vision  . Metoclopramide Hcl Other (See  Comments)    Unknown  . Morphine And Related Other (See Comments)    Patient states she is not allergic to morphine; states had morphine with no reaction  . Savella [Milnacipran Hcl] Other (See Comments)    Unknown   . Tape Other (See Comments)    Skin gets red and skin pulls off, paper tape only  . Trazodone And Nefazodone Other (See Comments)    hallucinations  . Venlafaxine Other (See Comments)    Affected eyes  . Citalopram Palpitations and Other (See Comments)    Heart rate increased  . Sertraline Hcl Other (See Comments)    Stomach upset     CURRENT MEDICATIONS:   Current Outpatient Medications  Medication Sig Dispense Refill  . clopidogrel (PLAVIX) 75 MG tablet Take 1 tablet (75 mg total) by mouth at bedtime.    . cyclobenzaprine (FLEXERIL) 10 MG tablet Take 10 mg by mouth 3 (three) times daily as needed for muscle spasms.    . diazepam (VALIUM) 5 MG tablet Take 5 mg  by mouth every 12 (twelve) hours as needed for anxiety.     . fluticasone (FLONASE) 50 MCG/ACT nasal spray Place 2 sprays into both nostrils daily.    . folic acid (FOLVITE) 1 MG tablet Take 1 mg by mouth daily.      . metoprolol succinate (TOPROL-XL) 25 MG 24 hr tablet Take 25 mg by mouth every morning. Takes 1 hour after taking Maxzide    . nitroGLYCERIN (NITROSTAT) 0.4 MG SL tablet Place 0.4 mg under the tongue every 5 (five) minutes as needed. For chest pain    . Omega 3 1200 MG CAPS Take 1,200 mg by mouth 2 (two) times daily.     Marland Kitchen omeprazole (PRILOSEC) 20 MG capsule Take 20 mg by mouth daily.    Marland Kitchen oxyCODONE-acetaminophen (PERCOCET) 7.5-325 MG tablet Take 1 tablet by mouth every 8 (eight) hours as needed for pain.     . potassium chloride (K-DUR) 10 MEQ tablet Take 10 mEq by mouth daily as needed (when potassium is low and legs start cramping).     . promethazine (PHENERGAN) 25 MG tablet Take 25 mg by mouth 4 (four) times daily as needed for nausea or vomiting.   2  . simvastatin (ZOCOR) 20 MG tablet Take 20 mg by mouth at bedtime.     . triamterene-hydrochlorothiazide (MAXZIDE-25) 37.5-25 MG per tablet Take 0.5 tablets by mouth every morning.     . Vitamin D, Ergocalciferol, (DRISDOL) 50000 UNITS CAPS Take 50,000 Units by mouth every Monday.      No current facility-administered medications for this visit.     REVIEW OF SYSTEMS:   [X]  denotes positive finding, [ ]  denotes negative finding Cardiac  Comments:  Chest pain or chest pressure:    Shortness of breath upon exertion:    Short of breath when lying flat:    Irregular heart rhythm:        Vascular    Pain in calf, thigh, or hip brought on by ambulation:    Pain in feet at night that wakes you up from your sleep:     Blood clot in your veins:    Leg swelling:         Pulmonary    Oxygen at home:    Productive cough:     Wheezing:         Neurologic    Sudden weakness in arms or legs:     Sudden numbness in  arms or  legs:     Sudden onset of difficulty speaking or slurred speech:    Temporary loss of vision in one eye:     Problems with dizziness:         Gastrointestinal    Blood in stool:     Vomited blood:         Genitourinary    Burning when urinating:     Blood in urine:        Psychiatric    Major depression:         Hematologic    Bleeding problems:    Problems with blood clotting too easily:        Skin    Rashes or ulcers:        Constitutional    Fever or chills:      PHYSICAL EXAM:   Vitals:   05/18/17 1046  BP: (!) 186/79  Pulse: 70  Resp: 20  Temp: 98.2 F (36.8 C)  TempSrc: Oral  SpO2: 99%  Weight: 126 lb (57.2 kg)  Height: 4\' 11"  (1.499 m)    GENERAL: The patient is a well-nourished female, in no acute distress. The vital signs are documented above. CARDIAC: There is a regular rate and rhythm.  VASCULAR: I cannot palpate pedal pulses today. PULMONARY: Non-labored respirations MUSCULOSKELETAL: There are no major deformities or cyanosis. NEUROLOGIC: No focal weakness or paresthesias are detected. SKIN: There are no ulcers or rashes noted. PSYCHIATRIC: The patient has a normal affect.  STUDIES:   I have reviewed her CTA neck with the following findings: High-grade flow-limiting stenosis at the RIGHT internal carotid artery origin, estimated 75-80%. This consists of both calcific and soft plaque. See discussion above.  Renal duplex was ordered and reviewed by myself.  This shows elevated velocities in the proximal renal artery with stenosis in the 60-99 percent range.  MEDICAL ISSUES:   Carotid stenosis: The patient's CT scan suggest 75-80 percent right carotid stenosis.  The patient is asymptomatic.  We will continue surveillance until her stenosis becomes greater than 80%.  She will follow-up with repeat imaging in 6 months.  Renal artery stenosis: The patient states her blood pressure has been more difficult to control recently.  The ultrasound today  suggest in-stent stenosis.  We have scheduled her for arteriogram on Tuesday, November 13.   [name], MD Vascular and Vein Specialists of [facility] [phone] Pager [phone]

## 2017-05-18 NOTE — Telephone Encounter (Signed)
A message was left for the patient on voicemail, with Dr.Rehman's recommendation.

## 2017-05-18 NOTE — Telephone Encounter (Signed)
Per Dr.Rehman the patient will need to get pain medication form her Pain Management Physician.

## 2017-05-18 NOTE — Progress Notes (Signed)
Vascular and Vein Specialist of [facility]  Patient name: [name] MRN: [id number] DOB: 01-Sep-1950 Sex: female   REASON FOR VISIT:    Follow up  HISOTRY OF PRESENT ILLNESS:   [name] is a 66 y.o. female returns today for follow-up.  On 06/15/2012 the patient underwent left renal artery stenting for hypertension on multiple medications.  She was found to have greater than 70% stenosis.  A 5 mm stent was placed.  There was no significant stenosis on the right side.  She has a history of a left carotid endarterectomy in 2010 for asymptomatic stenosis.  She states that her blood pressure has been more difficult to control recently.  She has also undergone colonoscopy with biopsy.  She is anxious about the results.  She states that she stopped smoking this morning  PAST MEDICAL HISTORY:   Past Medical History:  Diagnosis Date  . Anxiety   . Arthritis    Rhumatoid and Osteoarthritis  . Carotid artery disease (HCC)    Dr. Trula Slade, 60-79% RICA, s/p left CEA  . Coronary atherosclerosis of native coronary artery    DES RCA/circumflex 4/10, LVEF 65  . Depression   . Essential hypertension, benign   . Fibromyalgia   . GERD (gastroesophageal reflux disease)   . Hiatal hernia   . Hyperlipidemia   . NSTEMI (non-ST elevated myocardial infarction) ([place])    4/10  . Renal artery stenosis (HCC)    Left renal artery stent 12/13  . Restless leg syndrome   . Spondylosis without myelopathy   . Type 2 diabetes mellitus ([place])      FAMILY HISTORY:   Family History  Problem Relation Age of Onset  . Other Mother        Cerebral hemorrage  . Heart disease Mother   . Heart attack Mother   . Hyperlipidemia Mother   . Heart disease Father   . Heart attack Father   . Hyperlipidemia Father   . Heart disease Brother        Heart Disease before age 54  . Diabetes Brother   . Hypertension Brother   . Heart attack Brother   . Cancer Sister   .  Arthritis Sister   . Heart disease Sister   . Heart disease Daughter 90       Heart Disease before age 72  . Heart attack Daughter   . Hypertension Daughter   . Cancer Sister   . Arthritis Sister   . Diabetes Brother        Varicose  Veins  . Peripheral vascular disease Brother   . Diabetes Brother     SOCIAL HISTORY:   Social History   Tobacco Use  . Smoking status: Former Smoker    Packs/day: 1.00    Years: 45.00    Pack years: 45.00    Types: Cigarettes    Last attempt to quit: 03/14/2014    Years since quitting: 3.1  . Smokeless tobacco: Never Used  . Tobacco comment: 1 cigarette a day  Substance Use Topics  . Alcohol use: No    Alcohol/week: 0.0 oz     ALLERGIES:   Allergies  Allergen Reactions  . Celecoxib Itching  . Codeine Itching  . Dicyclomine Diarrhea and Other (See Comments)    Made diarrhea worse  . Lamictal [Lamotrigine] Other (See Comments)    Unknown  . Lyrica [Pregabalin] Other (See Comments)    REACTION: headaches,blurred vision  . Metoclopramide Hcl Other (See  Comments)    Unknown  . Morphine And Related Other (See Comments)    Patient states she is not allergic to morphine; states had morphine with no reaction  . Savella [Milnacipran Hcl] Other (See Comments)    Unknown   . Tape Other (See Comments)    Skin gets red and skin pulls off, paper tape only  . Trazodone And Nefazodone Other (See Comments)    hallucinations  . Venlafaxine Other (See Comments)    Affected eyes  . Citalopram Palpitations and Other (See Comments)    Heart rate increased  . Sertraline Hcl Other (See Comments)    Stomach upset     CURRENT MEDICATIONS:   Current Outpatient Medications  Medication Sig Dispense Refill  . clopidogrel (PLAVIX) 75 MG tablet Take 1 tablet (75 mg total) by mouth at bedtime.    . cyclobenzaprine (FLEXERIL) 10 MG tablet Take 10 mg by mouth 3 (three) times daily as needed for muscle spasms.    . diazepam (VALIUM) 5 MG tablet Take 5 mg  by mouth every 12 (twelve) hours as needed for anxiety.     . fluticasone (FLONASE) 50 MCG/ACT nasal spray Place 2 sprays into both nostrils daily.    . folic acid (FOLVITE) 1 MG tablet Take 1 mg by mouth daily.      . metoprolol succinate (TOPROL-XL) 25 MG 24 hr tablet Take 25 mg by mouth every morning. Takes 1 hour after taking Maxzide    . nitroGLYCERIN (NITROSTAT) 0.4 MG SL tablet Place 0.4 mg under the tongue every 5 (five) minutes as needed. For chest pain    . Omega 3 1200 MG CAPS Take 1,200 mg by mouth 2 (two) times daily.     Marland Kitchen omeprazole (PRILOSEC) 20 MG capsule Take 20 mg by mouth daily.    Marland Kitchen oxyCODONE-acetaminophen (PERCOCET) 7.5-325 MG tablet Take 1 tablet by mouth every 8 (eight) hours as needed for pain.     . potassium chloride (K-DUR) 10 MEQ tablet Take 10 mEq by mouth daily as needed (when potassium is low and legs start cramping).     . promethazine (PHENERGAN) 25 MG tablet Take 25 mg by mouth 4 (four) times daily as needed for nausea or vomiting.   2  . simvastatin (ZOCOR) 20 MG tablet Take 20 mg by mouth at bedtime.     . triamterene-hydrochlorothiazide (MAXZIDE-25) 37.5-25 MG per tablet Take 0.5 tablets by mouth every morning.     . Vitamin D, Ergocalciferol, (DRISDOL) 50000 UNITS CAPS Take 50,000 Units by mouth every Monday.      No current facility-administered medications for this visit.     REVIEW OF SYSTEMS:   [X]  denotes positive finding, [ ]  denotes negative finding Cardiac  Comments:  Chest pain or chest pressure:    Shortness of breath upon exertion:    Short of breath when lying flat:    Irregular heart rhythm:        Vascular    Pain in calf, thigh, or hip brought on by ambulation:    Pain in feet at night that wakes you up from your sleep:     Blood clot in your veins:    Leg swelling:         Pulmonary    Oxygen at home:    Productive cough:     Wheezing:         Neurologic    Sudden weakness in arms or legs:     Sudden numbness in  arms or  legs:     Sudden onset of difficulty speaking or slurred speech:    Temporary loss of vision in one eye:     Problems with dizziness:         Gastrointestinal    Blood in stool:     Vomited blood:         Genitourinary    Burning when urinating:     Blood in urine:        Psychiatric    Major depression:         Hematologic    Bleeding problems:    Problems with blood clotting too easily:        Skin    Rashes or ulcers:        Constitutional    Fever or chills:      PHYSICAL EXAM:   Vitals:   05/18/17 1046  BP: (!) 186/79  Pulse: 70  Resp: 20  Temp: 98.2 F (36.8 C)  TempSrc: Oral  SpO2: 99%  Weight: 126 lb (57.2 kg)  Height: 4\' 11"  (1.499 m)    GENERAL: The patient is a well-nourished female, in no acute distress. The vital signs are documented above. CARDIAC: There is a regular rate and rhythm.  VASCULAR: I cannot palpate pedal pulses today. PULMONARY: Non-labored respirations MUSCULOSKELETAL: There are no major deformities or cyanosis. NEUROLOGIC: No focal weakness or paresthesias are detected. SKIN: There are no ulcers or rashes noted. PSYCHIATRIC: The patient has a normal affect.  STUDIES:   I have reviewed her CTA neck with the following findings: High-grade flow-limiting stenosis at the RIGHT internal carotid artery origin, estimated 75-80%. This consists of both calcific and soft plaque. See discussion above.  Renal duplex was ordered and reviewed by myself.  This shows elevated velocities in the proximal renal artery with stenosis in the 60-99 percent range.  MEDICAL ISSUES:   Carotid stenosis: The patient's CT scan suggest 75-80 percent right carotid stenosis.  The patient is asymptomatic.  We will continue surveillance until her stenosis becomes greater than 80%.  She will follow-up with repeat imaging in 6 months.  Renal artery stenosis: The patient states her blood pressure has been more difficult to control recently.  The ultrasound today  suggest in-stent stenosis.  We have scheduled her for arteriogram on Tuesday, November 13.   [name], MD Vascular and Vein Specialists of [facility] [phone] Pager [phone]

## 2017-05-26 ENCOUNTER — Ambulatory Visit (HOSPITAL_COMMUNITY)
Admission: RE | Admit: 2017-05-26 | Discharge: 2017-05-26 | Disposition: A | Discharge status: Home / Self Care | Payer: Medicare HMO | Source: Ambulatory Visit | Attending: Surgery | Admitting: Surgery

## 2017-05-26 ENCOUNTER — Encounter (HOSPITAL_COMMUNITY)
Admission: RE | Disposition: A | Discharge status: Home / Self Care | Payer: Self-pay | Source: Ambulatory Visit | Attending: Surgery

## 2017-05-26 DIAGNOSIS — T82858A Stenosis of vascular prosthetic devices, implants and grafts, initial encounter: Secondary | ICD-10-CM | POA: Insufficient documentation

## 2017-05-26 DIAGNOSIS — I252 Old myocardial infarction: Secondary | ICD-10-CM | POA: Diagnosis not present

## 2017-05-26 DIAGNOSIS — M797 Fibromyalgia: Secondary | ICD-10-CM | POA: Insufficient documentation

## 2017-05-26 DIAGNOSIS — M069 Rheumatoid arthritis, unspecified: Secondary | ICD-10-CM | POA: Insufficient documentation

## 2017-05-26 DIAGNOSIS — I10 Essential (primary) hypertension: Secondary | ICD-10-CM | POA: Diagnosis not present

## 2017-05-26 DIAGNOSIS — I701 Atherosclerosis of renal artery: Secondary | ICD-10-CM | POA: Insufficient documentation

## 2017-05-26 DIAGNOSIS — Z885 Allergy status to narcotic agent status: Secondary | ICD-10-CM | POA: Diagnosis not present

## 2017-05-26 DIAGNOSIS — Z7902 Long term (current) use of antithrombotics/antiplatelets: Secondary | ICD-10-CM | POA: Diagnosis not present

## 2017-05-26 DIAGNOSIS — Z87891 Personal history of nicotine dependence: Secondary | ICD-10-CM | POA: Diagnosis not present

## 2017-05-26 DIAGNOSIS — I2510 Atherosclerotic heart disease of native coronary artery without angina pectoris: Secondary | ICD-10-CM | POA: Insufficient documentation

## 2017-05-26 DIAGNOSIS — Z79899 Other long term (current) drug therapy: Secondary | ICD-10-CM | POA: Insufficient documentation

## 2017-05-26 DIAGNOSIS — E119 Type 2 diabetes mellitus without complications: Secondary | ICD-10-CM | POA: Insufficient documentation

## 2017-05-26 DIAGNOSIS — M1990 Unspecified osteoarthritis, unspecified site: Secondary | ICD-10-CM | POA: Insufficient documentation

## 2017-05-26 DIAGNOSIS — Y838 Other surgical procedures as the cause of abnormal reaction of the patient, or of later complication, without mention of misadventure at the time of the procedure: Secondary | ICD-10-CM | POA: Diagnosis not present

## 2017-05-26 DIAGNOSIS — E785 Hyperlipidemia, unspecified: Secondary | ICD-10-CM | POA: Diagnosis not present

## 2017-05-26 DIAGNOSIS — I6521 Occlusion and stenosis of right carotid artery: Secondary | ICD-10-CM | POA: Insufficient documentation

## 2017-05-26 HISTORY — PX: PERIPHERAL VASCULAR BALLOON ANGIOPLASTY: CATH118281

## 2017-05-26 HISTORY — PX: RENAL ANGIOGRAPHY: CATH118260

## 2017-05-26 LAB — POCT I-STAT, CHEM 8
BUN: 11 mg/dL (ref 6–20)
CALCIUM ION: 1.16 mmol/L (ref 1.15–1.40)
CREATININE: 0.8 mg/dL (ref 0.44–1.00)
Chloride: 103 mmol/L (ref 101–111)
GLUCOSE: 105 mg/dL — AB (ref 65–99)
HCT: 35 % — ABNORMAL LOW (ref 36.0–46.0)
HEMOGLOBIN: 11.9 g/dL — AB (ref 12.0–15.0)
Potassium: 3.8 mmol/L (ref 3.5–5.1)
Sodium: 139 mmol/L (ref 135–145)
TCO2: 28 mmol/L (ref 22–32)

## 2017-05-26 LAB — POCT ACTIVATED CLOTTING TIME
ACTIVATED CLOTTING TIME: 180 s
Activated Clotting Time: 241 seconds

## 2017-05-26 LAB — GLUCOSE, CAPILLARY: GLUCOSE-CAPILLARY: 94 mg/dL (ref 65–99)

## 2017-05-26 SURGERY — RENAL ANGIOGRAPHY
Anesthesia: LOCAL

## 2017-05-26 MED ORDER — SODIUM CHLORIDE 0.9 % IV SOLN
250.0000 mL | INTRAVENOUS | Status: DC | PRN
Start: 2017-05-26 — End: 2017-05-26

## 2017-05-26 MED ORDER — SODIUM CHLORIDE 0.9 % IV SOLN: INTRAVENOUS | Status: AC

## 2017-05-26 MED ORDER — HEPARIN SODIUM (PORCINE) 1000 UNIT/ML IJ SOLN
INTRAMUSCULAR | Status: DC | PRN
  Administered 2017-05-26: 5000 [IU] via INTRAVENOUS

## 2017-05-26 MED ORDER — MIDAZOLAM HCL 2 MG/2ML IJ SOLN
INTRAMUSCULAR | Status: AC
  Filled 2017-05-26: qty 2

## 2017-05-26 MED ORDER — LABETALOL HCL 5 MG/ML IV SOLN: 10.0000 mg | INTRAVENOUS | Status: DC | PRN

## 2017-05-26 MED ORDER — HYDRALAZINE HCL 20 MG/ML IJ SOLN
5.0000 mg | INTRAMUSCULAR | Status: DC | PRN
  Administered 2017-05-26: 5 mg via INTRAVENOUS

## 2017-05-26 MED ORDER — HEPARIN SODIUM (PORCINE) 1000 UNIT/ML IJ SOLN
INTRAMUSCULAR | Status: AC
  Filled 2017-05-26: qty 1

## 2017-05-26 MED ORDER — MORPHINE SULFATE (PF) 4 MG/ML IV SOLN
INTRAVENOUS | Status: AC
  Administered 2017-05-26: 2 mg
  Filled 2017-05-26: qty 1

## 2017-05-26 MED ORDER — FENTANYL CITRATE (PF) 100 MCG/2ML IJ SOLN
INTRAMUSCULAR | Status: DC | PRN
  Administered 2017-05-26: 50 ug via INTRAVENOUS

## 2017-05-26 MED ORDER — HYDRALAZINE HCL 20 MG/ML IJ SOLN
INTRAMUSCULAR | Status: AC
  Filled 2017-05-26: qty 1

## 2017-05-26 MED ORDER — HEPARIN (PORCINE) IN NACL 2-0.9 UNIT/ML-% IJ SOLN
INTRAMUSCULAR | Status: AC
  Filled 2017-05-26: qty 500

## 2017-05-26 MED ORDER — SODIUM CHLORIDE 0.9 % IV SOLN
INTRAVENOUS | Status: DC
  Administered 2017-05-26: 06:00:00 via INTRAVENOUS

## 2017-05-26 MED ORDER — IODIXANOL 320 MG/ML IV SOLN
INTRAVENOUS | Status: DC | PRN
  Administered 2017-05-26: 40 mL via INTRA_ARTERIAL

## 2017-05-26 MED ORDER — HEPARIN (PORCINE) IN NACL 2-0.9 UNIT/ML-% IJ SOLN
INTRAMUSCULAR | Status: AC | PRN
  Administered 2017-05-26: 1000 mL

## 2017-05-26 MED ORDER — MIDAZOLAM HCL 2 MG/2ML IJ SOLN
INTRAMUSCULAR | Status: DC | PRN
  Administered 2017-05-26: 1 mg via INTRAVENOUS

## 2017-05-26 MED ORDER — SODIUM CHLORIDE 0.9% FLUSH
3.0000 mL | INTRAVENOUS | Status: DC | PRN
Start: 2017-05-26 — End: 2017-05-26

## 2017-05-26 MED ORDER — SODIUM CHLORIDE 0.9% FLUSH: 3.0000 mL | Freq: Two times a day (BID) | INTRAVENOUS | Status: DC

## 2017-05-26 MED ORDER — LIDOCAINE HCL (PF) 1 % IJ SOLN
INTRAMUSCULAR | Status: AC
  Filled 2017-05-26: qty 30

## 2017-05-26 MED ORDER — LIDOCAINE HCL (PF) 1 % IJ SOLN
INTRAMUSCULAR | Status: DC | PRN
  Administered 2017-05-26: 15 mL

## 2017-05-26 MED ORDER — MORPHINE SULFATE (PF) 10 MG/ML IV SOLN: 2.0000 mg | INTRAVENOUS | Status: DC | PRN

## 2017-05-26 MED ORDER — FENTANYL CITRATE (PF) 100 MCG/2ML IJ SOLN
INTRAMUSCULAR | Status: AC
  Filled 2017-05-26: qty 2

## 2017-05-26 SURGICAL SUPPLY — 18 items
BALLN VIATRAC 5X20X135 (BALLOONS) ×3
BALLOON VIATRAC 5X20X135 (BALLOONS) ×1 IMPLANT
CATH OMNI FLUSH 5F 65CM (CATHETERS) ×2 IMPLANT
COVER PRB 48X5XTLSCP FOLD TPE (BAG) ×2 IMPLANT
COVER PROBE 5X48 (BAG) ×6
DRAPE ZERO GRAVITY STERILE (DRAPES) ×2 IMPLANT
GUIDE CATH VISTA JR4 6F (CATHETERS) ×2 IMPLANT
KIT ENCORE 26 ADVANTAGE (KITS) ×2 IMPLANT
KIT MICROINTRODUCER STIFF 5F (SHEATH) ×2 IMPLANT
KIT PV (KITS) ×3 IMPLANT
SHEATH PINNACLE 5F 10CM (SHEATH) ×2 IMPLANT
SHEATH PINNACLE 6F 10CM (SHEATH) ×2 IMPLANT
SYR MEDRAD MARK V 150ML (SYRINGE) ×3 IMPLANT
TRANSDUCER W/STOPCOCK (MISCELLANEOUS) ×3 IMPLANT
TRAY PV CATH (CUSTOM PROCEDURE TRAY) ×3 IMPLANT
TUBING CIL FLEX 10 FLL-RA (TUBING) ×2 IMPLANT
WIRE BENTSON .035X145CM (WIRE) ×2 IMPLANT
WIRE SPARTACORE .014X190CM (WIRE) ×2 IMPLANT

## 2017-05-26 NOTE — Op Note (Signed)
    Patient name: [name] MRN: [id number] DOB: Dec 16, 1950 Sex: female  05/26/2017 Pre-operative Diagnosis: Left renal artery stenosis Post-operative diagnosis:  Same Surgeon:  [name] Procedure Performed:  1.  Ultrasound-guided access, right femoral artery  2.  Abdominal aortogram  3.  First-order catheterization (left renal artery)  4.  Left renal artery angiogram  5.  Balloon angioplasty, left renal artery  6.  Conscious sedation (24 minutes)    Indications: The patient has previously undergone left renal artery stenting secondary to uncontrolled hypertension.  She had improvement in her blood pressure, however her blood pressure has become more difficult to control.  Her most recent ultrasound identified stenosis within her stent.  She is here today for further evaluation.  Procedure:  The patient was identified in the holding area and taken to room 8.  The patient was then placed supine on the table and prepped and draped in the usual sterile fashion.  A time out was called.  Conscious sedation was administered with the use of IV fentanyl and Versed under continuous physician and nurse monitoring.  Heart rate, blood pressure, and oxygen saturations were continuously monitored.  Ultrasound was used to evaluate the right common femoral artery.  It was patent .  A digital ultrasound image was acquired.  A micropuncture needle was used to access the right common femoral artery under ultrasound guidance.  An 018 wire was advanced without resistance and a micropuncture sheath was placed.  The 018 wire was removed and a benson wire was placed.  The micropuncture sheath was exchanged for a 6 french sheath.  An omniflush catheter was advanced over the wire to the level of L-1.  An abdominal angiogram was obtained.   Findings:   Aortogram: The infrarenal abdominal aorta is widely patent as are bilateral iliac arteries.  The right renal artery is patent.  There is a stent within the left  renal artery with approximately 70-80% stenosis.    Intervention: After the above images were acquired the decision was made to proceed with intervention.  The patient was fully heparinized using a JR4 guide catheter and a [place] core wire, the left renal artery was selected.  I then used a 5 x 20 [place] balloon to perform balloon angioplasty, taking the balloon to 12 atm for 1 minute.  Completion imaging revealed resolution of the stenosis.  Catheters and wires were removed.  The patient was taken [place] for sheath pull once her coagulation profile corrects  Impression:  #1  Approximately 70-80% stenosis within the left renal artery stent.  This was successfully treated using a 5 x 20 balloon with residual stenosis less than 10%.   Theotis Burrow, M.D. [facility] Ve[facility] Specialists of [place] Office: [phone] Pager:  [phone]

## 2017-05-26 NOTE — Discharge Instructions (Signed)
Angiogram, Care After °This sheet gives you information about how to care for yourself after your procedure. Your health care provider may also give you more specific instructions. If you have problems or questions, contact your health care provider. °What can I expect after the procedure? °After the procedure, it is common to have bruising and tenderness at the catheter insertion area. °Follow these instructions at home: °Insertion site care  °· Follow instructions from your health care provider about how to take care of your insertion site. Make sure you: °¨ Wash your hands with soap and water before you change your bandage (dressing). If soap and water are not available, use hand sanitizer. °¨ Change your dressing as told by your health care provider. °¨ Leave stitches (sutures), skin glue, or adhesive strips in place. These skin closures may need to stay in place for 2 weeks or longer. If adhesive strip edges start to loosen and curl up, you may trim the loose edges. Do not remove adhesive strips completely unless your health care provider tells you to do that. °· Do not take baths, swim, or use a hot tub until your health care provider approves. °· You may shower 24-48 hours after the procedure or as told by your health care provider. °¨ Gently wash the site with plain soap and water. °¨ Pat the area dry with a clean towel. °¨ Do not rub the site. This may cause bleeding. °· Do not apply powder or lotion to the site. Keep the site clean and dry. °· Check your insertion site every day for signs of infection. Check for: °¨ Redness, swelling, or pain. °¨ Fluid or blood. °¨ Warmth. °¨ Pus or a bad smell. °Activity  °· Rest as told by your health care provider, usually for 1-2 days. °· Do not lift anything that is heavier than 10 lbs. (4.5 kg) or as told by your health care provider. °· Do not drive for 24 hours if you were given a medicine to help you relax (sedative). °· Do not drive or use heavy machinery while  taking prescription pain medicine. °General instructions  °· Return to your normal activities as told by your health care provider, usually in about a week. Ask your health care provider what activities are safe for you. °· If the catheter site starts bleeding, lie flat and put pressure on the site. If the bleeding does not stop, get help right away. This is a medical emergency. °· Drink enough fluid to keep your urine clear or pale yellow. This helps flush the contrast dye from your body. °· Take over-the-counter and prescription medicines only as told by your health care provider. °· Keep all follow-up visits as told by your health care provider. This is important. °Contact a health care provider if: °· You have a fever or chills. °· You have redness, swelling, or pain around your insertion site. °· You have fluid or blood coming from your insertion site. °· The insertion site feels warm to the touch. °· You have pus or a bad smell coming from your insertion site. °· You have bruising around the insertion site. °· You notice blood collecting in the tissue around the catheter site (hematoma). The hematoma may be painful to the touch. °Get help right away if: °· You have severe pain at the catheter insertion area. °· The catheter insertion area swells very fast. °· The catheter insertion area is bleeding, and the bleeding does not stop when you hold steady pressure on   the area. °· The area near or just beyond the catheter insertion site becomes pale, cool, tingly, or numb. °These symptoms may represent a serious problem that is an emergency. Do not wait to see if the symptoms will go away. Get medical help right away. Call your local emergency services (911 in the [place] Do not drive yourself to the hospital. °Summary °· After the procedure, it is common to have bruising and tenderness at the catheter insertion area. °· After the procedure, it is important to rest and drink plenty of fluids. °· Do not take baths,  swim, or use a hot tub until your health care provider says it is okay to do so. You may shower 24-48 hours after the procedure or as told by your health care provider. °· If the catheter site starts bleeding, lie flat and put pressure on the site. If the bleeding does not stop, get help right away. This is a medical emergency. °This information is not intended to replace advice given to you by your health care provider. Make sure you discuss any questions you have with your health care provider. °Document Released: 01/16/2005 Document Revised: 06/04/2016 Document Reviewed: 06/04/2016 °[employer]vier Interactive Patient Education © 2017 [employer] ° °

## 2017-05-26 NOTE — Interval H&P Note (Signed)
History and Physical Interval Note:  05/26/2017 7:22 AM  [name]  has presented today for surgery, with the diagnosis of pvd  The various methods of treatment have been discussed with the patient and family. After consideration of risks, benefits and other options for treatment, the patient has consented to  Procedure(s): ABDOMINAL AORTOGRAM W/LOWER EXTREMITY (N/A) as a surgical intervention .  The patient's history has been reviewed, patient examined, no change in status, stable for surgery.  I have reviewed the patient's chart and labs.  Questions were answered to the patient's satisfaction.     [name]

## 2017-05-26 NOTE — Progress Notes (Addendum)
Site area: RFA Site Prior to Removal:  Level 1 Pressure Applied For:30 min Manual:   yes Patient Status During Pull:  stable Post Pull Site:  Level 1 Post Pull Instructions Given:  yes Post Pull Pulses Present: doppler Dressing Applied:  tegaderm Bedrest begins @ 1030 till 1430 Comments: area soft, quarter sized bruise Non raised

## 2017-05-26 NOTE — Addendum Note (Signed)
Addended by: Lianne Cure A on: 05/26/2017 03:55 PM   Modules accepted: Orders

## 2017-05-27 ENCOUNTER — Encounter (HOSPITAL_COMMUNITY): Payer: Self-pay | Admitting: Surgery

## 2017-05-27 ENCOUNTER — Telehealth: Payer: Self-pay | Admitting: Surgery

## 2017-05-27 NOTE — Telephone Encounter (Signed)
-----   Message from Mena Goes, RN sent at 05/26/2017  1:50 PM EST ----- Regarding: 4-6 weeks w/ renal duplex and NP    ----- Message ----- From: Serafina Mitchell, MD Sent: 05/26/2017   8:11 AM To: [facility] Charge Pool  05/26/2017:  Surgeon:  [name] Procedure Performed:  1.  Ultrasound-guided access, right femoral artery  2.  Abdominal aortogram  3.  First-order catheterization (left renal artery)  4.  Left renal artery angiogram  5.  Balloon angioplasty, left renal artery  6.  Conscious sedation (24 minutes)   Follow-up 4-6 weeks with a renal artery duplex to see Vinnie Level

## 2017-05-27 NOTE — Telephone Encounter (Signed)
Sched lab 07/16/16 at 8:00 and MD 07/20/16 at 12:15. Spoke to pt.

## 2017-07-09 ENCOUNTER — Ambulatory Visit (HOSPITAL_COMMUNITY)
Admission: RE | Admit: 2017-07-09 | Discharge: 2017-07-09 | Disposition: A | Discharge status: Home / Self Care | Payer: Medicare HMO | Source: Ambulatory Visit | Attending: Neurology | Admitting: Neurology

## 2017-07-09 ENCOUNTER — Other Ambulatory Visit: Payer: Self-pay | Admitting: Neurology

## 2017-07-09 ENCOUNTER — Other Ambulatory Visit (HOSPITAL_COMMUNITY): Payer: Self-pay | Admitting: Neurology

## 2017-07-09 DIAGNOSIS — M47897 Other spondylosis, lumbosacral region: Secondary | ICD-10-CM | POA: Insufficient documentation

## 2017-07-09 DIAGNOSIS — M47896 Other spondylosis, lumbar region: Secondary | ICD-10-CM | POA: Diagnosis not present

## 2017-07-09 DIAGNOSIS — M545 Low back pain: Secondary | ICD-10-CM

## 2017-07-16 ENCOUNTER — Ambulatory Visit (HOSPITAL_COMMUNITY): Admission: RE | Admit: 2017-07-16 | Payer: Medicare HMO | Source: Ambulatory Visit

## 2017-07-17 ENCOUNTER — Ambulatory Visit (HOSPITAL_COMMUNITY)
Admission: RE | Admit: 2017-07-17 | Discharge: 2017-07-17 | Disposition: A | Discharge status: Home / Self Care | Payer: Medicare HMO | Source: Ambulatory Visit | Attending: Surgery | Admitting: Surgery

## 2017-07-17 DIAGNOSIS — I701 Atherosclerosis of renal artery: Secondary | ICD-10-CM | POA: Diagnosis not present

## 2017-07-20 ENCOUNTER — Ambulatory Visit (INDEPENDENT_AMBULATORY_CARE_PROVIDER_SITE_OTHER): Payer: Medicare HMO | Admitting: Family

## 2017-07-20 ENCOUNTER — Encounter: Payer: Self-pay | Admitting: Family

## 2017-07-20 VITALS — BP 188/74 | HR 98 | Temp 98.4°F | Resp 18 | Ht 59.0 in | Wt 131.7 lb

## 2017-07-20 DIAGNOSIS — I10 Essential (primary) hypertension: Secondary | ICD-10-CM

## 2017-07-20 DIAGNOSIS — Z9889 Other specified postprocedural states: Secondary | ICD-10-CM

## 2017-07-20 DIAGNOSIS — I701 Atherosclerosis of renal artery: Secondary | ICD-10-CM

## 2017-07-20 NOTE — Progress Notes (Addendum)
Postoperative Visit   History of Present Illness  [name] is a 67 y.o. female who is s/p balloon angioplasty of left renal artery on 05-26-17 by Dr. Trula Slade for left renal artery stenosis. The patient has previously undergone left renal artery stenting secondary to uncontrolled hypertension.  She had improvement in her blood pressure, however her blood pressure had become more difficult to control.  Recent ultrasound identified stenosis within her stent.   Findings:              Aortogram: The infrarenal abdominal aorta is widely patent as are bilateral iliac arteries.  The right renal artery is patent.  There is a stent within the left renal artery with approximately 70-80% stenosis.  Pt states her blood pressure at home since the last renal artery procedure is about 979'G/92'J to 194'R systolic.  She took her AM medications today.  She states her blood pressure was 137/70's in Dr. Woody Seller office last week.     She returns today for left renal artery duplex and evaluation.   She is scheduled to return in May 2019 with left renal artery duplex, carotid duplex, and see Dr. Trula Slade.   She smoked x 45 years, quit in 2015.  She states she had DM in the past when she weighed 190 pounds, lost weight with diet and exercise. Weighs 131 # today.   She sees pain management for DDD, spinal stenosis, pain in her c-spine, and the pain from fibromyalgia.  She states last week she took Lyrica that caused headache and vision issues. She was then placed on prednisone, but had to stop that as it kept her awake.    For VQI Use Only  PRE-ADM LIVING: Home  AMB STATUS: Ambulatory   Past Medical History:  Diagnosis Date  . Anxiety   . Arthritis    Rhumatoid and Osteoarthritis  . Carotid artery disease (HCC)    Dr. Trula Slade, 60-79% RICA, s/p left CEA  . Coronary atherosclerosis of native coronary artery    DES RCA/circumflex 4/10, LVEF 65  . Depression   . Essential hypertension, benign     . Fibromyalgia   . GERD (gastroesophageal reflux disease)   . Hiatal hernia   . Hyperlipidemia   . NSTEMI (non-ST elevated myocardial infarction) ([place])    4/10  . Renal artery stenosis (HCC)    Left renal artery stent 12/13  . Restless leg syndrome   . Spondylosis without myelopathy   . Type 2 diabetes mellitus ([place])     Past Surgical History:  Procedure Laterality Date  . ABDOMINAL HYSTERECTOMY  1977  . BREAST LUMPECTOMY    . CAROTID ENDARTERECTOMY Left 02/28/2009  . CHOLECYSTECTOMY  1991  . COLONOSCOPY    . COLONOSCOPY N/A 05/08/2017   Procedure: COLONOSCOPY;  Surgeon: Rogene Houston, MD;  [facility];  Service: Endoscopy;  Laterality: N/A;  12:00  . ESOPHAGOGASTRODUODENOSCOPY N/A 04/07/2013   Procedure: ESOPHAGOGASTRODUODENOSCOPY (EGD);  Surgeon: Rogene Houston, MD;  Location: [facility];  Service: Endoscopy;  Laterality: N/A;  250  . ESOPHAGOGASTRODUODENOSCOPY N/A 02/02/2014   Procedure: ESOPHAGOGASTRODUODENOSCOPY (EGD);  Surgeon: Rogene Houston, MD;  Location: [facility];  Service: Endoscopy;  Laterality: N/A;  120  . EYE SURGERY     Catartact bilateral  . JOINT REPLACEMENT  March 2013   Left knee  . JOINT REPLACEMENT  2006   Left Knee  . kidney stent     Left December 2013  .  LUMBAR DISC SURGERY     L5-S1  . MULTIPLE TOOTH EXTRACTIONS  Jan. 2015  . Neck Fusion  1996   C4 to C6  . PERCUTANEOUS STENT INTERVENTION Left 06/15/2012   Procedure: PERCUTANEOUS STENT INTERVENTION;  Surgeon: Serafina Mitchell, MD;  Location: [facility] CATH LAB;  Service: Cardiovascular;  Laterality: Left;  renal  . PERIPHERAL VASCULAR BALLOON ANGIOPLASTY  05/26/2017   Procedure: PERIPHERAL VASCULAR BALLOON ANGIOPLASTY;  Surgeon: Serafina Mitchell, MD;  Location: [place] [facility] LAB;  Service: Cardiovascular;;  Lt. Renal Angioplasty  . POLYPECTOMY  05/08/2017   Procedure: POLYPECTOMY;  Surgeon: Rogene Houston, MD;  [facility]: [facility];  Service: Endoscopy;;  colon   . RENAL  ANGIOGRAM N/A 06/15/2012   Procedure: RENAL ANGIOGRAM;  Surgeon: Serafina Mitchell, MD;  Location: [facility] CATH LAB;  Service: Cardiovascular;  Laterality: N/A;  . RENAL ANGIOGRAPHY  05/26/2017   Procedure: RENAL ANGIOGRAPHY;  Surgeon: Serafina Mitchell, MD;  Location: [place] CV LAB;  Service: Cardiovascular;;  . SPINE SURGERY  1994   Ruptured disc  . TONSILECTOMY, ADENOIDECTOMY, BILATERAL MYRINGOTOMY AND TUBES  1969  . TOTAL KNEE ARTHROPLASTY     Left  . TUBAL LIGATION      Social History   Socioeconomic History  . Marital status: Divorced    Spouse name: Not on file  . Number of children: Not on file  . Years of education: Not on file  . Highest education level: Not on file  Social Needs  . Financial resource strain: Not on file  . Food insecurity - worry: Not on file  . Food insecurity - inability: Not on file  . Transportation needs - medical: Not on file  . Transportation needs - non-medical: Not on file  Occupational History  . Not on file  Tobacco Use  . Smoking status: Former Smoker    Packs/day: 1.00    Years: 45.00    Pack years: 45.00    Types: Cigarettes    Last attempt to quit: 03/14/2014    Years since quitting: 3.3  . Smokeless tobacco: Never Used  . Tobacco comment: 1 cigarette a day  Substance and Sexual Activity  . Alcohol use: No    Alcohol/week: 0.0 oz  . Drug use: No  . Sexual activity: Not on file  Other Topics Concern  . Not on file  Social History Narrative  . Not on file    Allergies  Allergen Reactions  . Celecoxib Itching  . Codeine Itching  . Dicyclomine Diarrhea and Other (See Comments)    Made diarrhea worse  . Lamictal [Lamotrigine] Other (See Comments)    Unknown  . Lyrica [Pregabalin] Other (See Comments)    REACTION: headaches,blurred vision  . Metoclopramide Hcl Other (See Comments)    Dizziness   . Savella [Milnacipran Hcl] Other (See Comments)    Unknown   . Tape Other (See Comments)    Skin gets red and skin pulls off,  paper tape only  . Trazodone And Nefazodone Other (See Comments)    hallucinations  . Venlafaxine Other (See Comments)    Affected eyes  . Citalopram Palpitations and Other (See Comments)    Heart rate increased  . Sertraline Hcl Other (See Comments)    Stomach upset    Current Outpatient Medications on File Prior to Visit  Medication Sig Dispense Refill  . acetaminophen (TYLENOL) 650 MG CR tablet Take 1,300 mg 2 (two) times daily as needed by mouth for pain.    Marland Kitchen  Ascorbic Acid (VITAMIN C) 500 MG CHEW Chew 500 mg daily as needed by mouth (immune support).    . cetirizine (ZYRTEC) 10 MG tablet Take 10 mg daily as needed by mouth for allergies.    Marland Kitchen clopidogrel (PLAVIX) 75 MG tablet Take 75 mg daily after breakfast by mouth.     . cyclobenzaprine (FLEXERIL) 10 MG tablet Take 10 mg by mouth 3 (three) times daily as needed for muscle spasms.    . diazepam (VALIUM) 5 MG tablet Take 5 mg 2 (two) times daily by mouth.     . diphenoxylate-atropine (LOMOTIL) 2.5-0.025 MG tablet Take 1 tablet 3 (three) times daily as needed by mouth for diarrhea or loose stools.  1  . fluticasone (FLONASE) 50 MCG/ACT nasal spray Place 2 sprays into both nostrils daily.    . folic acid (FOLVITE) 1 MG tablet Take 1 mg by mouth daily.      . Melatonin 10 MG TABS Take 10 mg at bedtime as needed by mouth (sleep).    . metoprolol succinate (TOPROL-XL) 25 MG 24 hr tablet Take 25 mg 2 (two) times daily by mouth.     . nitroGLYCERIN (NITROSTAT) 0.4 MG SL tablet Place 0.4 mg under the tongue every 5 (five) minutes as needed. For chest pain    . Omega 3 1200 MG CAPS Take 1,200 mg 3 (three) times daily by mouth.     Marland Kitchen omeprazole (PRILOSEC) 20 MG capsule Take 20 mg by mouth daily.    . ondansetron (ZOFRAN-ODT) 4 MG disintegrating tablet Take 4 mg every 8 (eight) hours as needed by mouth for nausea or vomiting.    . potassium chloride (K-DUR) 10 MEQ tablet Take 10 mEq by mouth daily as needed (when potassium is low and legs  start cramping).     . triamterene-hydrochlorothiazide (MAXZIDE-25) 37.5-25 MG per tablet Take 0.5 tablets by mouth every morning.     . Vitamin D, Ergocalciferol, (DRISDOL) 50000 UNITS CAPS Take 50,000 Units by mouth every Monday.     . VOLTAREN 1 % GEL Apply 3 g 3 (three) times daily as needed topically.  2  . oxyCODONE-acetaminophen (PERCOCET) 7.5-325 MG tablet Take 1 tablet every 8 (eight) hours by mouth.      No current facility-administered medications on file prior to visit.      Physical Examination  Vitals:   07/20/17 1226 07/20/17 1228  BP: (!) 185/75 (!) 188/74  Pulse: 98   Resp: 18   Temp: 98.4 F (36.9 C)   TempSrc: Oral   SpO2: 99%   Weight: 131 lb 11.2 oz (59.7 kg)   Height: 4\' 11"  (1.499 m)    Body mass index is 26.6 kg/m.  PHYSICAL EXAMINATION: General: The patient appears her stated age.   HEENT:  No gross abnormalities Pulmonary: Respirations are non-labored, CTAB.  Abdomen: Soft and mild generalized tenderness to palpation with normal pitched bowel sounds. Musculoskeletal: There are no major deformities. Tender to palpation in bilateral popliteal space (consistent with fibromyalgia pain).   Neurologic: No focal weakness or paresthesias are detected, Skin: There are no ulcer or rashes noted. Psychiatric: The patient has normal affect. Cardiovascular: There is a regular rate and rhythm without significant murmur appreciated.   Vascular: Vessel Right Left  Radial Palpable Palpable  Carotid Palpable, with bruit Palpable, without bruit  Aorta Not palpable N/A  Popliteal Not palpable Not palpable  PT Not  Palpable Not  Palpable  DP Not  Palpable not Palpable    Medical  Decision Making  LATESE DUFAULT is a 67 y.o. female who presents s/p balloon angioplasty of left renal artery on 05-26-17. Her blood pressure at home and recently in her PCP's office is in control; pt states her blood pressure is elevated now as she is anxious.   I discussed with Dr.  Trula Slade pt blood pressure at home and last week in Dr. Woody Seller office as much improved compared to before the last left renal artery angioplasty in November 2018. She states she is nervous as reason for blood pressure elevated now.  She is scheduled to return in May 2019 with left renal artery duplex, carotid duplex, and see Dr. Trula Slade.    I discussed in depth with the patient the nature of atherosclerosis, and emphasized the importance of maximal medical management including strict control of blood pressure, blood glucose, and lipid levels, obtaining regular exercise, and cessation of smoking.  The patient is aware that without maximal medical management the underlying atherosclerotic disease process will progress, limiting the benefit of any interventions. The patient is currently not on a statin, likely due to her muscoskeletal pain.   The patient is currently on an anti-platelet: Plavix.    Thank you for allowing [place] to participate in this patient's care.  Clemon Chambers, RN, MSN, FNP-C [facility] Office: [phone]  07/20/2017, 12:35 PM  Clinic MD: Trula Slade

## 2017-07-20 NOTE — Patient Instructions (Addendum)
Before your next abdominal ultrasound:  Take two Extra-Strength Gas-X capsules at bedtime the night before the test. Take another two Extra-Strength Gas-X capsules 3 hours before the test.  Avoid gas forming foods the day before the test.       Renal Artery Stenosis Renal artery stenosis (RAS) is narrowing of the artery that carries blood to your kidneys. It can affect one or both kidneys. Your kidneys filter waste and extra fluid from your blood. You get rid of the waste and fluid when you urinate. Your kidneys also make an important chemical messenger (hormone) called renin. Renin helps regulate your blood pressure. The first sign of RAS may be high blood pressure. Over time, other symptoms can develop. What are the causes? Plaque buildup in your arteries (atherosclerosis) is the main cause of RAS. The plaques that cause this are made up of:  Fat.  Cholesterol.  Calcium.  Other substances.  As these substances build up in your renal artery, this slows the blood supply to your kidneys. The lack of blood and oxygen causes the signs and symptoms of RAS. A much less common cause of RAS is a disease called fibromuscular dysplasia. This disease causes abnormal cell growth that narrows the renal artery. It is not related to atherosclerosis. It occurs mostly in women who are 44-27 years old. It may be passed down through families. What increases the risk? You may be at risk for renal artery stenosis if you:  Are a man who is at least 70 years old.  Are a woman who is at least 69 years old.  Have high blood pressure.  Have high cholesterol.  Are a smoker.  Abuse alcohol.  Have diabetes or prediabetes.  Are overweight.  Have a family history of early heart disease.  What are the signs or symptoms? RAS usually develops slowly. You may not have any signs or symptoms at first. The earliest signs may be:  Developing high blood pressure.  A sudden increase in existing high blood  pressure.  No longer responding to medicine that used to control your blood pressure.  Later signs and symptoms are due to kidney damage. They may include:  Fatigue.  Shortness of breath.  Swollen legs and feet.  Dry skin.  Headaches.  Muscle cramps.  Loss of appetite.  Nausea or vomiting.  How is this diagnosed? Your health care provider may suspect RAS based on changes in your blood pressure and your risk factors. A physical exam will be done. Your health care provider may use a stethoscope to listen for a whooshing sound (bruit) that can occur where the renal artery is blocking blood flow. Several tests may be done to confirm a diagnosis of RAS. These may include:  Blood and urine tests to check your kidney function.  Imaging tests of your kidneys, such as: ? A test that involves using sound waves to create an image of your kidneys and the blood flow to your kidneys (ultrasound). ? A test in which dye is injected into one of your blood vessels so images can be taken as the dye flows through your renal arteries (angiogram). These tests can be done using X-rays, a CT scan (computed tomography angiogram, CTA), or a type of MRI (magnetic resonance angiogram, MRA).  How is this treated? Making lifestyle changes to reduce your risk factors is the first treatment option for early RAS. If the blood flow to one of your kidneys is cut by more than half, you may need  medicine to:  Lower your blood pressure. This is the main medical treatment for RAS. You may need more than one type of medicine for this. The two types that work best for RAS are: ? ACE inhibitors. ? Angiotensin receptor blockers.  Reduce fluid in the body (diuretics).  Lower your cholesterol (statins).  If medicine is not enough to control RAS, you may need surgery. This may involve:  Threading a tube with an inflatable balloon into the renal artery to force it open (angioplasty).  Removing plaque from inside the  artery (endarterectomy).  Follow these instructions at home:  Take medicines only as directed by your health care provider.  Make any lifestyle changes recommended by your health care provider. This may include: ? Working with a dietitian to maintain a heart-healthy diet. This type of diet is low in saturated fat, salt, and added sugar. ? Starting an exercise program as directed by your health care provider. ? Maintaining a healthy weight. ? Quitting smoking. ? Not abusing alcohol.  Keep all follow-up visits as directed by your health care provider. This is important. Contact a health care provider if:  Your symptoms of RAS are not getting better.  Your symptoms are changing or getting worse. Get help right away if:  You have very bad pain in your back or abdomen.  You have blood in your urine. This information is not intended to replace advice given to you by your health care provider. Make sure you discuss any questions you have with your health care provider. Document Released: 03/26/2005 Document Revised: 12/06/2015 Document Reviewed: 10/13/2013 Elsevier Interactive Patient Education  [employer].

## 2017-11-05 ENCOUNTER — Encounter (INDEPENDENT_AMBULATORY_CARE_PROVIDER_SITE_OTHER): Payer: Self-pay | Admitting: *Deleted

## 2017-11-30 ENCOUNTER — Ambulatory Visit: Payer: Medicare HMO | Admitting: Surgery

## 2017-11-30 ENCOUNTER — Encounter (HOSPITAL_COMMUNITY): Payer: Medicare HMO

## 2017-12-01 ENCOUNTER — Telehealth (INDEPENDENT_AMBULATORY_CARE_PROVIDER_SITE_OTHER): Payer: Self-pay | Admitting: *Deleted

## 2017-12-01 NOTE — Telephone Encounter (Signed)
Referring MD/PCP: vyas   Procedure: tcs  Reason/Indication:  Hx polyps  Has patient had this procedure before?  Yes, 04/2017  If so, when, by whom and where?    Is there a family history of colon cancer?  no  Who?  What age when diagnosed?    Is patient diabetic?   no      Does patient have prosthetic heart valve or mechanical valve?  no  Do you have a pacemaker?  no  Has patient ever had endocarditis? no  Has patient had joint replacement within last 12 months?  no  Is patient constipated or do they take laxatives? no  Does patient have a history of alcohol/drug use?  no  Is patient on blood thinner such as Coumadin, Plavix and/or Aspirin? yes  Medications: plavix 75 mg daily (vyas), asa 81 mg daily, vit d 50,000 on Monday, metoprolol 25 mg bid, omeprazole 20 mg daily, nitro prn, triam.hctz 37.5/25 mg 1/2 tab daily, tramadol 50 mg daily, lyrica 20 mg 2 at bedtime, flonase, zyrtec   NO NEED FOR PROPOFOL PER DR REHMAN    Allergies: see epic  Medication Adjustment per Dr Lindi Adie, NP: Plavix 5 days & ASA 2 days  Procedure date & time:

## 2017-12-14 ENCOUNTER — Other Ambulatory Visit: Payer: Self-pay

## 2017-12-14 DIAGNOSIS — I701 Atherosclerosis of renal artery: Secondary | ICD-10-CM

## 2017-12-14 DIAGNOSIS — I6523 Occlusion and stenosis of bilateral carotid arteries: Secondary | ICD-10-CM

## 2017-12-18 DIAGNOSIS — M545 Low back pain: Secondary | ICD-10-CM | POA: Diagnosis not present

## 2017-12-18 DIAGNOSIS — M797 Fibromyalgia: Secondary | ICD-10-CM | POA: Diagnosis not present

## 2017-12-18 DIAGNOSIS — Z79891 Long term (current) use of opiate analgesic: Secondary | ICD-10-CM | POA: Diagnosis not present

## 2017-12-18 DIAGNOSIS — M130 Polyarthritis, unspecified: Secondary | ICD-10-CM | POA: Diagnosis not present

## 2017-12-28 ENCOUNTER — Ambulatory Visit (HOSPITAL_COMMUNITY)
Admission: RE | Admit: 2017-12-28 | Discharge: 2017-12-28 | Disposition: A | Discharge status: Home / Self Care | Payer: Medicare HMO | Source: Ambulatory Visit | Attending: Surgery | Admitting: Surgery

## 2017-12-28 ENCOUNTER — Ambulatory Visit (INDEPENDENT_AMBULATORY_CARE_PROVIDER_SITE_OTHER): Payer: Medicare HMO | Admitting: Surgery

## 2017-12-28 ENCOUNTER — Other Ambulatory Visit: Payer: Self-pay

## 2017-12-28 ENCOUNTER — Ambulatory Visit (INDEPENDENT_AMBULATORY_CARE_PROVIDER_SITE_OTHER)
Admission: RE | Admit: 2017-12-28 | Discharge: 2017-12-28 | Disposition: A | Discharge status: Home / Self Care | Payer: Medicare HMO | Source: Ambulatory Visit | Attending: Surgery | Admitting: Surgery

## 2017-12-28 ENCOUNTER — Encounter: Payer: Self-pay | Admitting: Surgery

## 2017-12-28 VITALS — BP 147/62 | HR 61 | Temp 98.5°F | Resp 16 | Ht 59.0 in | Wt 133.0 lb

## 2017-12-28 DIAGNOSIS — I6523 Occlusion and stenosis of bilateral carotid arteries: Secondary | ICD-10-CM

## 2017-12-28 DIAGNOSIS — Z95828 Presence of other vascular implants and grafts: Secondary | ICD-10-CM | POA: Diagnosis not present

## 2017-12-28 DIAGNOSIS — I701 Atherosclerosis of renal artery: Secondary | ICD-10-CM | POA: Diagnosis not present

## 2017-12-28 DIAGNOSIS — Z09 Encounter for follow-up examination after completed treatment for conditions other than malignant neoplasm: Secondary | ICD-10-CM | POA: Diagnosis not present

## 2017-12-28 NOTE — Progress Notes (Signed)
Vascular and Vein Specialist of [facility]  Patient name: [name] MRN: [id number] DOB: 02/10/1951 Sex: female   REASON FOR VISIT:    Follow up  HISOTRY OF PRESENT ILLNESS:    Heather Akerley Nesteris a 67 y.o.femalereturns today for follow-up. On 06/15/2012 the patient underwent left renal artery stenting for hypertension on multiple medications. She was found to have greater than 70% stenosis. A 5 mm stent was placed. She had improvement of her blood pressure.  Recently was elevated and therefore on 05/26/2017 she underwent repeat angiography.  A 70-80% stenosis was found within the left renal artery stent which was treated with balloon angioplasty using a 5 mm balloon.   There was no significant stenosis on the right side. She has a history of a left carotid endarterectomy in 2010 for asymptomatic stenosis.   She is wanting to have right knee replacement surgery.  Her blood pressure has been essentially stable.  She denies any neurologic symptoms.  PAST MEDICAL HISTORY:   Past Medical History:  Diagnosis Date  . Anxiety   . Arthritis    Rhumatoid and Osteoarthritis  . Carotid artery disease (HCC)    Dr. Trula Slade, 60-79% RICA, s/p left CEA  . Coronary atherosclerosis of native coronary artery    DES RCA/circumflex 4/10, LVEF 65  . Depression   . Essential hypertension, benign   . Fibromyalgia   . GERD (gastroesophageal reflux disease)   . Hiatal hernia   . Hyperlipidemia   . NSTEMI (non-ST elevated myocardial infarction) ([place])    4/10  . Renal artery stenosis (HCC)    Left renal artery stent 12/13  . Restless leg syndrome   . Spondylosis without myelopathy   . Type 2 diabetes mellitus ([place])      FAMILY HISTORY:   Family History  Problem Relation Age of Onset  . Other Mother        Cerebral hemorrage  . Heart disease Mother   . Heart attack Mother   . Hyperlipidemia Mother   . Heart disease Father   . Heart attack  Father   . Hyperlipidemia Father   . Heart disease Brother        Heart Disease before age 71  . Diabetes Brother   . Hypertension Brother   . Heart attack Brother   . Cancer Sister   . Arthritis Sister   . Heart disease Sister   . Heart disease Daughter 27       Heart Disease before age 2  . Heart attack Daughter   . Hypertension Daughter   . Cancer Sister   . Arthritis Sister   . Diabetes Brother        Varicose  Veins  . Peripheral vascular disease Brother   . Diabetes Brother     SOCIAL HISTORY:   Social History   Tobacco Use  . Smoking status: Former Smoker    Years: 45.00    Types: Cigarettes    Last attempt to quit: 10/2017    Years since quitting: 0.2  . Smokeless tobacco: Never Used  Substance Use Topics  . Alcohol use: No    Alcohol/week: 0.0 oz     ALLERGIES:   Allergies  Allergen Reactions  . Celecoxib Itching  . Codeine Itching  . Dicyclomine Diarrhea and Other (See Comments)    Made diarrhea worse  . Lamictal [Lamotrigine] Other (See Comments)    Unknown  . Lyrica [Pregabalin] Other (See Comments)    REACTION: headaches,blurred vision  .  Metoclopramide Hcl Other (See Comments)    Dizziness   . Savella [Milnacipran Hcl] Other (See Comments)    Unknown   . Tape Other (See Comments)    Skin gets red and skin pulls off, paper tape only  . Trazodone And Nefazodone Other (See Comments)    hallucinations  . Venlafaxine Other (See Comments)    Affected eyes  . Citalopram Palpitations and Other (See Comments)    Heart rate increased  . Sertraline Hcl Other (See Comments)    Stomach upset     CURRENT MEDICATIONS:   Current Outpatient Medications  Medication Sig Dispense Refill  . acetaminophen (TYLENOL) 650 MG CR tablet Take 1,300 mg 2 (two) times daily as needed by mouth for pain.    . Ascorbic Acid (VITAMIN C) 500 MG CHEW Chew 500 mg daily as needed by mouth (immune support).    . buprenorphine (SUBUTEX) 2 MG SUBL SL tablet Place 2 mg  under the tongue every morning.  0  . cetirizine (ZYRTEC) 10 MG tablet Take 10 mg daily as needed by mouth for allergies.    Marland Kitchen clopidogrel (PLAVIX) 75 MG tablet Take 75 mg daily after breakfast by mouth.     . cyclobenzaprine (FLEXERIL) 10 MG tablet Take 10 mg by mouth 3 (three) times daily as needed for muscle spasms.    . diazepam (VALIUM) 5 MG tablet Take 5 mg 2 (two) times daily by mouth.     . diphenoxylate-atropine (LOMOTIL) 2.5-0.025 MG tablet Take 1 tablet 3 (three) times daily as needed by mouth for diarrhea or loose stools.  1  . fluticasone (FLONASE) 50 MCG/ACT nasal spray Place 2 sprays into both nostrils daily.    . folic acid (FOLVITE) 1 MG tablet Take 1 mg by mouth daily.      Marland Kitchen LYRICA 50 MG capsule Take 50 mg by mouth at bedtime. Take 2 capsules at bedtime  1  . Melatonin 10 MG TABS Take 10 mg at bedtime as needed by mouth (sleep).    . metoprolol succinate (TOPROL-XL) 25 MG 24 hr tablet Take 25 mg 2 (two) times daily by mouth.     . nitroGLYCERIN (NITROSTAT) 0.4 MG SL tablet Place 0.4 mg under the tongue every 5 (five) minutes as needed. For chest pain    . Omega 3 1200 MG CAPS Take 1,200 mg 3 (three) times daily by mouth.     Marland Kitchen omeprazole (PRILOSEC) 20 MG capsule Take 20 mg by mouth daily.    . ondansetron (ZOFRAN-ODT) 4 MG disintegrating tablet Take 4 mg every 8 (eight) hours as needed by mouth for nausea or vomiting.    . potassium chloride (K-DUR) 10 MEQ tablet Take 10 mEq by mouth daily as needed (when potassium is low and legs start cramping).     . triamterene-hydrochlorothiazide (MAXZIDE-25) 37.5-25 MG per tablet Take 0.5 tablets by mouth every morning.     . Vitamin D, Ergocalciferol, (DRISDOL) 50000 UNITS CAPS Take 50,000 Units by mouth every Monday.     . VOLTAREN 1 % GEL Apply 3 g 3 (three) times daily as needed topically.  2   No current facility-administered medications for this visit.     REVIEW OF SYSTEMS:   [X]  denotes positive finding, [ ]  denotes negative  finding Cardiac  Comments:  Chest pain or chest pressure:    Shortness of breath upon exertion:    Short of breath when lying flat:    Irregular heart rhythm:  Vascular    Pain in calf, thigh, or hip brought on by ambulation:    Pain in feet at night that wakes you up from your sleep:     Blood clot in your veins:    Leg swelling:         Pulmonary    Oxygen at home:    Productive cough:     Wheezing:         Neurologic    Sudden weakness in arms or legs:     Sudden numbness in arms or legs:     Sudden onset of difficulty speaking or slurred speech:    Temporary loss of vision in one eye:     Problems with dizziness:         Gastrointestinal    Blood in stool:     Vomited blood:         Genitourinary    Burning when urinating:     Blood in urine:        Psychiatric    Major depression:         Hematologic    Bleeding problems:    Problems with blood clotting too easily:        Skin    Rashes or ulcers:        Constitutional    Fever or chills:      PHYSICAL EXAM:   Vitals:   12/28/17 0954 12/28/17 1000  BP: (!) 141/57 (!) 147/62  Pulse: 61   Resp: 16   Temp: 98.5 F (36.9 C)   TempSrc: Oral   SpO2: 90%   Weight: 133 lb (60.3 kg)   Height: 4\' 11"  (1.499 m)     GENERAL: The patient is a well-nourished female, in no acute distress. The vital signs are documented above. CARDIAC: There is a regular rate and rhythm.  PULMONARY: Non-labored respirations MUSCULOSKELETAL: There are no major deformities or cyanosis. NEUROLOGIC: No focal weakness or paresthesias are detected. SKIN: There are no ulcers or rashes noted. PSYCHIATRIC: The patient has a normal affect.  STUDIES:   I have ordered and reviewed her vascular lab studies with the following findings: Carotid duplex: 60-79% right carotid stenosis.  1-39% left carotid stenosis. Renal duplex: Normal size of left kidney.  Technically limited study with poor visualization  MEDICAL ISSUES:    Carotid stenosis: The patient is asymptomatic with a moderate stenosis on the right.  She will follow-up in 6 months with a repeat duplex.  The left endarterectomy site remains widely patent  Left renal artery stenosis: Poor visualization of ultrasound today.  Her blood pressure remained stable.  I will repeat her ultrasound in 6 months.    [name], MD [facility] and Vein Specialists of [facility] [phone] Pager [phone]

## 2017-12-30 ENCOUNTER — Other Ambulatory Visit: Payer: Self-pay

## 2017-12-30 DIAGNOSIS — I701 Atherosclerosis of renal artery: Secondary | ICD-10-CM

## 2017-12-30 DIAGNOSIS — I6523 Occlusion and stenosis of bilateral carotid arteries: Secondary | ICD-10-CM

## 2018-01-01 DIAGNOSIS — I701 Atherosclerosis of renal artery: Secondary | ICD-10-CM | POA: Diagnosis not present

## 2018-01-01 DIAGNOSIS — Z6824 Body mass index (BMI) 24.0-24.9, adult: Secondary | ICD-10-CM | POA: Diagnosis not present

## 2018-01-01 DIAGNOSIS — N390 Urinary tract infection, site not specified: Secondary | ICD-10-CM | POA: Diagnosis not present

## 2018-01-01 DIAGNOSIS — R350 Frequency of micturition: Secondary | ICD-10-CM | POA: Diagnosis not present

## 2018-01-01 DIAGNOSIS — I10 Essential (primary) hypertension: Secondary | ICD-10-CM | POA: Diagnosis not present

## 2018-01-01 DIAGNOSIS — Z299 Encounter for prophylactic measures, unspecified: Secondary | ICD-10-CM | POA: Diagnosis not present

## 2018-01-08 DIAGNOSIS — M130 Polyarthritis, unspecified: Secondary | ICD-10-CM | POA: Diagnosis not present

## 2018-01-08 DIAGNOSIS — M797 Fibromyalgia: Secondary | ICD-10-CM | POA: Diagnosis not present

## 2018-01-08 DIAGNOSIS — Z79891 Long term (current) use of opiate analgesic: Secondary | ICD-10-CM | POA: Diagnosis not present

## 2018-01-08 DIAGNOSIS — M545 Low back pain: Secondary | ICD-10-CM | POA: Diagnosis not present

## 2018-01-25 ENCOUNTER — Encounter (INDEPENDENT_AMBULATORY_CARE_PROVIDER_SITE_OTHER): Payer: Self-pay | Admitting: Internal Medicine

## 2018-01-25 ENCOUNTER — Encounter (INDEPENDENT_AMBULATORY_CARE_PROVIDER_SITE_OTHER): Payer: Self-pay | Admitting: *Deleted

## 2018-01-25 ENCOUNTER — Telehealth (INDEPENDENT_AMBULATORY_CARE_PROVIDER_SITE_OTHER): Payer: Self-pay | Admitting: *Deleted

## 2018-01-25 ENCOUNTER — Ambulatory Visit (INDEPENDENT_AMBULATORY_CARE_PROVIDER_SITE_OTHER): Payer: Medicare HMO | Admitting: Internal Medicine

## 2018-01-25 VITALS — BP 134/60 | HR 76 | Temp 98.2°F | Ht 59.0 in | Wt 136.7 lb

## 2018-01-25 DIAGNOSIS — K219 Gastro-esophageal reflux disease without esophagitis: Secondary | ICD-10-CM

## 2018-01-25 DIAGNOSIS — Z86010 Personal history of colonic polyps: Secondary | ICD-10-CM

## 2018-01-25 MED ORDER — PEG 3350-KCL-NA BICARB-NACL 420 G PO SOLR: 4000.0000 mL | Freq: Once | ORAL | 0 refills | Status: AC

## 2018-01-25 NOTE — Progress Notes (Signed)
Subjective:    Patient ID: [name], female    DOB: Nov 20, 1950, 67 y.o.   MRN: [id number] Did not bring her medication or a list.  HPI  Presents today with c/o epigastric pain radiating down to her umbilicus. Symptoms for a year. She says she has the pain when she eats. She says she has bloating. Sometimes she can hear herself gurgling.  When she has a BM, her stools are loose, but not runny.  She says she wears a pad for incontinence. Usually has a BM 3-4 times a week and sometimes more.  No melena or BRRB.   Her appetite is okay. She eats one meal a day. She has gained from 127 to 136.7 since September of last year.  She is requesting and EGD this OV for the pain.   Her last EGD was in 2015 for hx of melena and epigastric pain:  Impression: Erosive reflux esophagitis. Nonerosive antral gastritis. Biopsy taken for routine histology. Last colonoscopy was in October of 2018 High risk colon cancer surveillance). Personal hx of colonic polyps.                            - Two small polyps at the hepatic flexure and in                            the ascending colon. Biopsied.                           - One 4 mm polyp in the ascending colon, removed                            with a cold snare. Resected and retrieved.                           comment: poor prep. Blunt end of the cecum was not                            seen. Biopsy: Tubular adenoma.   Since prep was not good she will return for follow-up colonoscopy in June next year (2019).   4 cardiac stent and kidney stent and maintained on Plavix.     Review of Systems  Past Medical History:  Diagnosis Date  . Anxiety   . Arthritis    Rhumatoid and Osteoarthritis  . Carotid artery disease (HCC)    Dr. Trula Slade, 60-79% RICA, s/p left CEA  . Coronary atherosclerosis of native coronary artery    DES RCA/circumflex 4/10, LVEF 65  . Depression   . Essential hypertension, benign   . Fibromyalgia   . GERD  (gastroesophageal reflux disease)   . Hiatal hernia   . Hyperlipidemia   . NSTEMI (non-ST elevated myocardial infarction) ([place])    4/10  . Renal artery stenosis (HCC)    Left renal artery stent 12/13  . Restless leg syndrome   . Spondylosis without myelopathy   . Type 2 diabetes mellitus ([place])     Past Surgical History:  Procedure Laterality Date  . ABDOMINAL HYSTERECTOMY  1977  . BREAST LUMPECTOMY    . CAROTID ENDARTERECTOMY Left 02/28/2009  . CHOLECYSTECTOMY  1991  . COLONOSCOPY    . COLONOSCOPY N/A 05/08/2017  Procedure: COLONOSCOPY;  Surgeon: Rogene Houston, MD;  [facility];  Service: Endoscopy;  Laterality: N/A;  12:00  . ESOPHAGOGASTRODUODENOSCOPY N/A 04/07/2013   Procedure: ESOPHAGOGASTRODUODENOSCOPY (EGD);  Surgeon: Rogene Houston, MD;  Location: [facility];  Service: Endoscopy;  Laterality: N/A;  250  . ESOPHAGOGASTRODUODENOSCOPY N/A 02/02/2014   Procedure: ESOPHAGOGASTRODUODENOSCOPY (EGD);  Surgeon: Rogene Houston, MD;  Location: [facility];  Service: Endoscopy;  Laterality: N/A;  120  . EYE SURGERY     Catartact bilateral  . JOINT REPLACEMENT  March 2013   Left knee  . JOINT REPLACEMENT  2006   Left Knee  . kidney stent     Left December 2013  . LUMBAR DISC SURGERY     L5-S1  . MULTIPLE TOOTH EXTRACTIONS  Jan. 2015  . Neck Fusion  1996   C4 to C6  . PERCUTANEOUS STENT INTERVENTION Left 06/15/2012   Procedure: PERCUTANEOUS STENT INTERVENTION;  Surgeon: Serafina Mitchell, MD;  Location: [facility] CATH LAB;  Service: Cardiovascular;  Laterality: Left;  renal  . PERIPHERAL VASCULAR BALLOON ANGIOPLASTY  05/26/2017   Procedure: PERIPHERAL VASCULAR BALLOON ANGIOPLASTY;  Surgeon: Serafina Mitchell, MD;  Location: [place] CV LAB;  Service: Cardiovascular;;  Lt. Renal Angioplasty  . POLYPECTOMY  05/08/2017   Procedure: POLYPECTOMY;  Surgeon: Rogene Houston, MD;  Location: [facility];  Service: Endoscopy;;  colon   . RENAL ANGIOGRAM N/A 06/15/2012    Procedure: RENAL ANGIOGRAM;  Surgeon: Serafina Mitchell, MD;  Location: [facility] CATH LAB;  Service: Cardiovascular;  Laterality: N/A;  . RENAL ANGIOGRAPHY  05/26/2017   Procedure: RENAL ANGIOGRAPHY;  Surgeon: Serafina Mitchell, MD;  Location: [place] CV LAB;  Service: Cardiovascular;;  . SPINE SURGERY  1994   Ruptured disc  . TONSILECTOMY, ADENOIDECTOMY, BILATERAL MYRINGOTOMY AND TUBES  1969  . TOTAL KNEE ARTHROPLASTY     Left  . TUBAL LIGATION      Allergies  Allergen Reactions  . Celecoxib Itching  . Codeine Itching  . Dicyclomine Diarrhea and Other (See Comments)    Made diarrhea worse  . Lamictal [Lamotrigine] Other (See Comments)    Unknown  . Lyrica [Pregabalin] Other (See Comments)    REACTION: headaches,blurred vision  . Metoclopramide Hcl Other (See Comments)    Dizziness   . Savella [Milnacipran Hcl] Other (See Comments)    Unknown   . Tape Other (See Comments)    Skin gets red and skin pulls off, paper tape only  . Trazodone And Nefazodone Other (See Comments)    hallucinations  . Venlafaxine Other (See Comments)    Affected eyes  . Citalopram Palpitations and Other (See Comments)    Heart rate increased  . Sertraline Hcl Other (See Comments)    Stomach upset    Current Outpatient Medications on File Prior to Visit  Medication Sig Dispense Refill  . buprenorphine (SUBUTEX) 2 MG SUBL SL tablet Place 2 mg under the tongue every morning.  0  . clopidogrel (PLAVIX) 75 MG tablet Take 75 mg daily after breakfast by mouth.     . diazepam (VALIUM) 5 MG tablet Take 5 mg 2 (two) times daily by mouth.     . fluticasone (FLONASE) 50 MCG/ACT nasal spray Place 2 sprays into both nostrils daily.    Marland Kitchen LYRICA 50 MG capsule Take 50 mg by mouth 2 (two) times daily. Take 2 capsules at bedtime  1  . metoprolol succinate (TOPROL-XL) 25 MG 24 hr tablet  Take 25 mg 2 (two) times daily by mouth.     . nitroGLYCERIN (NITROSTAT) 0.4 MG SL tablet Place 0.4 mg under the tongue every 5 (five)  minutes as needed. For chest pain    . Omega 3 1200 MG CAPS Take 1,200 mg 3 (three) times daily by mouth.     Marland Kitchen omeprazole (PRILOSEC) 20 MG capsule Take 20 mg by mouth daily.    . potassium chloride (K-DUR) 10 MEQ tablet Take 10 mEq by mouth daily as needed (when potassium is low and legs start cramping).     . triamterene-hydrochlorothiazide (MAXZIDE-25) 37.5-25 MG per tablet Take 0.5 tablets by mouth every morning.     . Vitamin D, Ergocalciferol, (DRISDOL) 50000 UNITS CAPS Take 50,000 Units by mouth every Monday.     . VOLTAREN 1 % GEL Apply 3 g 3 (three) times daily as needed topically.  2   No current facility-administered medications on file prior to visit.          Objective:   Physical Exam Blood pressure 134/60, pulse 76, temperature 98.2 F (36.8 C), height 4\' 11"  (1.499 m), weight 136 lb 11.2 oz (62 kg). Alert and oriented. Skin warm and dry. Oral mucosa is moist.   . Sclera anicteric, conjunctivae is pink. Thyroid not enlarged. No cervical lymphadenopathy. Lungs clear. Heart regular rate and rhythm.  Abdomen is soft. Bowel sounds are positive. No hepatomegaly. No abdominal masses felt.  Epigastric tenderness.  No edema to lower extremities.  .         Assessment & Plan:  GERD. Will schedule an EGD. Colonoscopy:Prep inadequate in 2018. Repeat. Hx of colon polyps.,  The risks of bleeding, perforation and infection were reviewed with patient.

## 2018-01-25 NOTE — Telephone Encounter (Signed)
Patient needs trilyte 

## 2018-01-25 NOTE — Patient Instructions (Signed)
The risks of bleeding, perforation and infection were reviewed with patient.  

## 2018-01-25 NOTE — Addendum Note (Signed)
Addended by: Butch Penny on: 01/25/2018 08:57 AM   Modules accepted: Orders, SmartSet

## 2018-01-26 ENCOUNTER — Telehealth (INDEPENDENT_AMBULATORY_CARE_PROVIDER_SITE_OTHER): Payer: Self-pay | Admitting: *Deleted

## 2018-01-26 NOTE — Telephone Encounter (Signed)
Per [facility] it is ok for patient to stop Plavix 5 days prior to EGD-- patient aware

## 2018-02-05 DIAGNOSIS — M797 Fibromyalgia: Secondary | ICD-10-CM | POA: Diagnosis not present

## 2018-02-05 DIAGNOSIS — M545 Low back pain: Secondary | ICD-10-CM | POA: Diagnosis not present

## 2018-02-05 DIAGNOSIS — M130 Polyarthritis, unspecified: Secondary | ICD-10-CM | POA: Diagnosis not present

## 2018-02-05 DIAGNOSIS — Z79891 Long term (current) use of opiate analgesic: Secondary | ICD-10-CM | POA: Diagnosis not present

## 2018-02-09 DIAGNOSIS — J449 Chronic obstructive pulmonary disease, unspecified: Secondary | ICD-10-CM | POA: Diagnosis not present

## 2018-02-09 DIAGNOSIS — I2510 Atherosclerotic heart disease of native coronary artery without angina pectoris: Secondary | ICD-10-CM | POA: Diagnosis not present

## 2018-02-09 DIAGNOSIS — E7800 Pure hypercholesterolemia, unspecified: Secondary | ICD-10-CM | POA: Diagnosis not present

## 2018-02-09 DIAGNOSIS — I10 Essential (primary) hypertension: Secondary | ICD-10-CM | POA: Diagnosis not present

## 2018-03-09 DIAGNOSIS — E7800 Pure hypercholesterolemia, unspecified: Secondary | ICD-10-CM | POA: Diagnosis not present

## 2018-03-09 DIAGNOSIS — J449 Chronic obstructive pulmonary disease, unspecified: Secondary | ICD-10-CM | POA: Diagnosis not present

## 2018-03-09 DIAGNOSIS — I2510 Atherosclerotic heart disease of native coronary artery without angina pectoris: Secondary | ICD-10-CM | POA: Diagnosis not present

## 2018-03-09 DIAGNOSIS — I10 Essential (primary) hypertension: Secondary | ICD-10-CM | POA: Diagnosis not present

## 2018-03-11 DIAGNOSIS — M1711 Unilateral primary osteoarthritis, right knee: Secondary | ICD-10-CM | POA: Diagnosis not present

## 2018-03-11 DIAGNOSIS — M1712 Unilateral primary osteoarthritis, left knee: Secondary | ICD-10-CM | POA: Diagnosis not present

## 2018-03-12 ENCOUNTER — Other Ambulatory Visit (HOSPITAL_COMMUNITY): Payer: Self-pay | Admitting: Internal Medicine

## 2018-03-12 ENCOUNTER — Ambulatory Visit (HOSPITAL_COMMUNITY)
Admission: RE | Admit: 2018-03-12 | Discharge: 2018-03-12 | Disposition: A | Discharge status: Home / Self Care | Payer: Medicare HMO | Source: Ambulatory Visit | Attending: Internal Medicine | Admitting: Internal Medicine

## 2018-03-12 DIAGNOSIS — E7800 Pure hypercholesterolemia, unspecified: Secondary | ICD-10-CM | POA: Diagnosis not present

## 2018-03-12 DIAGNOSIS — Z299 Encounter for prophylactic measures, unspecified: Secondary | ICD-10-CM | POA: Diagnosis not present

## 2018-03-12 DIAGNOSIS — M25561 Pain in right knee: Secondary | ICD-10-CM | POA: Diagnosis not present

## 2018-03-12 DIAGNOSIS — R600 Localized edema: Secondary | ICD-10-CM | POA: Diagnosis not present

## 2018-03-12 DIAGNOSIS — M79605 Pain in left leg: Secondary | ICD-10-CM | POA: Insufficient documentation

## 2018-03-12 DIAGNOSIS — Z6824 Body mass index (BMI) 24.0-24.9, adult: Secondary | ICD-10-CM | POA: Diagnosis not present

## 2018-03-12 DIAGNOSIS — J449 Chronic obstructive pulmonary disease, unspecified: Secondary | ICD-10-CM | POA: Diagnosis not present

## 2018-03-12 DIAGNOSIS — I10 Essential (primary) hypertension: Secondary | ICD-10-CM | POA: Diagnosis not present

## 2018-03-16 ENCOUNTER — Telehealth (INDEPENDENT_AMBULATORY_CARE_PROVIDER_SITE_OTHER): Payer: Self-pay | Admitting: *Deleted

## 2018-03-16 NOTE — Telephone Encounter (Signed)
noted 

## 2018-03-16 NOTE — Telephone Encounter (Signed)
Patient called, her brother is sick at this time and she has to cancel her TCS/EGD

## 2018-03-18 ENCOUNTER — Encounter (HOSPITAL_COMMUNITY): Payer: Self-pay

## 2018-03-18 ENCOUNTER — Ambulatory Visit (HOSPITAL_COMMUNITY): Admit: 2018-03-18 | Payer: Medicare HMO | Admitting: Internal Medicine

## 2018-03-18 SURGERY — EGD (ESOPHAGOGASTRODUODENOSCOPY)
Anesthesia: Moderate Sedation

## 2018-03-24 DIAGNOSIS — E7800 Pure hypercholesterolemia, unspecified: Secondary | ICD-10-CM | POA: Diagnosis not present

## 2018-03-24 DIAGNOSIS — Z1331 Encounter for screening for depression: Secondary | ICD-10-CM | POA: Diagnosis not present

## 2018-03-24 DIAGNOSIS — Z6825 Body mass index (BMI) 25.0-25.9, adult: Secondary | ICD-10-CM | POA: Diagnosis not present

## 2018-03-24 DIAGNOSIS — F419 Anxiety disorder, unspecified: Secondary | ICD-10-CM | POA: Diagnosis not present

## 2018-03-24 DIAGNOSIS — Z0000 Encounter for general adult medical examination without abnormal findings: Secondary | ICD-10-CM | POA: Diagnosis not present

## 2018-03-24 DIAGNOSIS — Z1339 Encounter for screening examination for other mental health and behavioral disorders: Secondary | ICD-10-CM | POA: Diagnosis not present

## 2018-03-24 DIAGNOSIS — Z7189 Other specified counseling: Secondary | ICD-10-CM | POA: Diagnosis not present

## 2018-03-24 DIAGNOSIS — Z299 Encounter for prophylactic measures, unspecified: Secondary | ICD-10-CM | POA: Diagnosis not present

## 2018-03-24 DIAGNOSIS — Z79899 Other long term (current) drug therapy: Secondary | ICD-10-CM | POA: Diagnosis not present

## 2018-03-24 DIAGNOSIS — Z1211 Encounter for screening for malignant neoplasm of colon: Secondary | ICD-10-CM | POA: Diagnosis not present

## 2018-03-24 DIAGNOSIS — R5383 Other fatigue: Secondary | ICD-10-CM | POA: Diagnosis not present

## 2018-03-30 DIAGNOSIS — I2510 Atherosclerotic heart disease of native coronary artery without angina pectoris: Secondary | ICD-10-CM | POA: Diagnosis not present

## 2018-03-30 DIAGNOSIS — E7800 Pure hypercholesterolemia, unspecified: Secondary | ICD-10-CM | POA: Diagnosis not present

## 2018-03-30 DIAGNOSIS — I10 Essential (primary) hypertension: Secondary | ICD-10-CM | POA: Diagnosis not present

## 2018-03-30 DIAGNOSIS — J449 Chronic obstructive pulmonary disease, unspecified: Secondary | ICD-10-CM | POA: Diagnosis not present

## 2018-04-08 DIAGNOSIS — M130 Polyarthritis, unspecified: Secondary | ICD-10-CM | POA: Diagnosis not present

## 2018-04-08 DIAGNOSIS — M797 Fibromyalgia: Secondary | ICD-10-CM | POA: Diagnosis not present

## 2018-04-08 DIAGNOSIS — Z79891 Long term (current) use of opiate analgesic: Secondary | ICD-10-CM | POA: Diagnosis not present

## 2018-04-08 DIAGNOSIS — M545 Low back pain: Secondary | ICD-10-CM | POA: Diagnosis not present

## 2018-04-15 DIAGNOSIS — E2839 Other primary ovarian failure: Secondary | ICD-10-CM | POA: Diagnosis not present

## 2018-04-15 DIAGNOSIS — Z23 Encounter for immunization: Secondary | ICD-10-CM | POA: Diagnosis not present

## 2018-04-20 ENCOUNTER — Ambulatory Visit (INDEPENDENT_AMBULATORY_CARE_PROVIDER_SITE_OTHER): Payer: Medicare HMO | Admitting: Internal Medicine

## 2018-04-26 DIAGNOSIS — G4700 Insomnia, unspecified: Secondary | ICD-10-CM | POA: Diagnosis not present

## 2018-04-26 DIAGNOSIS — Z6826 Body mass index (BMI) 26.0-26.9, adult: Secondary | ICD-10-CM | POA: Diagnosis not present

## 2018-04-26 DIAGNOSIS — I252 Old myocardial infarction: Secondary | ICD-10-CM | POA: Diagnosis not present

## 2018-04-26 DIAGNOSIS — I25118 Atherosclerotic heart disease of native coronary artery with other forms of angina pectoris: Secondary | ICD-10-CM | POA: Diagnosis not present

## 2018-04-26 DIAGNOSIS — F1120 Opioid dependence, uncomplicated: Secondary | ICD-10-CM | POA: Diagnosis not present

## 2018-04-26 DIAGNOSIS — I110 Hypertensive heart disease with heart failure: Secondary | ICD-10-CM | POA: Diagnosis not present

## 2018-04-26 DIAGNOSIS — I509 Heart failure, unspecified: Secondary | ICD-10-CM | POA: Diagnosis not present

## 2018-04-26 DIAGNOSIS — G8929 Other chronic pain: Secondary | ICD-10-CM | POA: Diagnosis not present

## 2018-04-26 DIAGNOSIS — F331 Major depressive disorder, recurrent, moderate: Secondary | ICD-10-CM | POA: Diagnosis not present

## 2018-04-28 DIAGNOSIS — R69 Illness, unspecified: Secondary | ICD-10-CM | POA: Diagnosis not present

## 2018-04-28 DIAGNOSIS — R5381 Other malaise: Secondary | ICD-10-CM | POA: Diagnosis not present

## 2018-05-11 DIAGNOSIS — F419 Anxiety disorder, unspecified: Secondary | ICD-10-CM | POA: Diagnosis not present

## 2018-05-14 DIAGNOSIS — M1711 Unilateral primary osteoarthritis, right knee: Secondary | ICD-10-CM | POA: Diagnosis not present

## 2018-05-19 DIAGNOSIS — Z6826 Body mass index (BMI) 26.0-26.9, adult: Secondary | ICD-10-CM | POA: Diagnosis not present

## 2018-05-19 DIAGNOSIS — G629 Polyneuropathy, unspecified: Secondary | ICD-10-CM | POA: Diagnosis not present

## 2018-05-19 DIAGNOSIS — F13280 Sedative, hypnotic or anxiolytic dependence with sedative, hypnotic or anxiolytic-induced anxiety disorder: Secondary | ICD-10-CM | POA: Diagnosis not present

## 2018-05-21 DIAGNOSIS — M1711 Unilateral primary osteoarthritis, right knee: Secondary | ICD-10-CM | POA: Diagnosis not present

## 2018-05-28 DIAGNOSIS — M1711 Unilateral primary osteoarthritis, right knee: Secondary | ICD-10-CM | POA: Diagnosis not present

## 2018-06-03 DIAGNOSIS — M797 Fibromyalgia: Secondary | ICD-10-CM | POA: Diagnosis not present

## 2018-06-03 DIAGNOSIS — M545 Low back pain: Secondary | ICD-10-CM | POA: Diagnosis not present

## 2018-06-03 DIAGNOSIS — M130 Polyarthritis, unspecified: Secondary | ICD-10-CM | POA: Diagnosis not present

## 2018-06-03 DIAGNOSIS — Z79891 Long term (current) use of opiate analgesic: Secondary | ICD-10-CM | POA: Diagnosis not present

## 2018-06-08 ENCOUNTER — Encounter (INDEPENDENT_AMBULATORY_CARE_PROVIDER_SITE_OTHER): Payer: Self-pay | Admitting: *Deleted

## 2018-06-08 NOTE — Progress Notes (Signed)
Gatorade/miralax prep

## 2018-06-16 ENCOUNTER — Other Ambulatory Visit (INDEPENDENT_AMBULATORY_CARE_PROVIDER_SITE_OTHER): Payer: Self-pay | Admitting: *Deleted

## 2018-06-16 DIAGNOSIS — K219 Gastro-esophageal reflux disease without esophagitis: Secondary | ICD-10-CM

## 2018-06-16 DIAGNOSIS — Z86010 Personal history of colonic polyps: Secondary | ICD-10-CM

## 2018-06-21 ENCOUNTER — Ambulatory Visit: Payer: Medicare HMO | Admitting: Surgery

## 2018-06-21 ENCOUNTER — Encounter (HOSPITAL_COMMUNITY): Payer: Medicare HMO

## 2018-06-21 ENCOUNTER — Inpatient Hospital Stay (HOSPITAL_COMMUNITY): Admission: RE | Admit: 2018-06-21 | Payer: Medicare HMO | Source: Ambulatory Visit

## 2018-06-22 DIAGNOSIS — I10 Essential (primary) hypertension: Secondary | ICD-10-CM | POA: Diagnosis not present

## 2018-06-22 DIAGNOSIS — I2510 Atherosclerotic heart disease of native coronary artery without angina pectoris: Secondary | ICD-10-CM | POA: Diagnosis not present

## 2018-06-22 DIAGNOSIS — J449 Chronic obstructive pulmonary disease, unspecified: Secondary | ICD-10-CM | POA: Diagnosis not present

## 2018-06-22 DIAGNOSIS — E7800 Pure hypercholesterolemia, unspecified: Secondary | ICD-10-CM | POA: Diagnosis not present

## 2018-06-24 DIAGNOSIS — M797 Fibromyalgia: Secondary | ICD-10-CM | POA: Diagnosis not present

## 2018-06-24 DIAGNOSIS — Z299 Encounter for prophylactic measures, unspecified: Secondary | ICD-10-CM | POA: Diagnosis not present

## 2018-06-24 DIAGNOSIS — Z6825 Body mass index (BMI) 25.0-25.9, adult: Secondary | ICD-10-CM | POA: Diagnosis not present

## 2018-06-24 DIAGNOSIS — T300 Burn of unspecified body region, unspecified degree: Secondary | ICD-10-CM | POA: Diagnosis not present

## 2018-06-24 DIAGNOSIS — F419 Anxiety disorder, unspecified: Secondary | ICD-10-CM | POA: Diagnosis not present

## 2018-06-24 DIAGNOSIS — I10 Essential (primary) hypertension: Secondary | ICD-10-CM | POA: Diagnosis not present

## 2018-07-19 DIAGNOSIS — N390 Urinary tract infection, site not specified: Secondary | ICD-10-CM | POA: Diagnosis not present

## 2018-07-19 DIAGNOSIS — Z299 Encounter for prophylactic measures, unspecified: Secondary | ICD-10-CM | POA: Diagnosis not present

## 2018-07-19 DIAGNOSIS — I10 Essential (primary) hypertension: Secondary | ICD-10-CM | POA: Diagnosis not present

## 2018-07-19 DIAGNOSIS — M545 Low back pain: Secondary | ICD-10-CM | POA: Diagnosis not present

## 2018-07-19 DIAGNOSIS — R300 Dysuria: Secondary | ICD-10-CM | POA: Diagnosis not present

## 2018-07-19 DIAGNOSIS — Z6825 Body mass index (BMI) 25.0-25.9, adult: Secondary | ICD-10-CM | POA: Diagnosis not present

## 2018-07-29 DIAGNOSIS — M797 Fibromyalgia: Secondary | ICD-10-CM | POA: Diagnosis not present

## 2018-07-29 DIAGNOSIS — M545 Low back pain: Secondary | ICD-10-CM | POA: Diagnosis not present

## 2018-07-29 DIAGNOSIS — M130 Polyarthritis, unspecified: Secondary | ICD-10-CM | POA: Diagnosis not present

## 2018-07-29 DIAGNOSIS — M25569 Pain in unspecified knee: Secondary | ICD-10-CM | POA: Diagnosis not present

## 2018-07-29 DIAGNOSIS — F13280 Sedative, hypnotic or anxiolytic dependence with sedative, hypnotic or anxiolytic-induced anxiety disorder: Secondary | ICD-10-CM | POA: Diagnosis not present

## 2018-07-29 DIAGNOSIS — F419 Anxiety disorder, unspecified: Secondary | ICD-10-CM | POA: Diagnosis not present

## 2018-08-04 DIAGNOSIS — I10 Essential (primary) hypertension: Secondary | ICD-10-CM | POA: Diagnosis not present

## 2018-08-04 DIAGNOSIS — I2510 Atherosclerotic heart disease of native coronary artery without angina pectoris: Secondary | ICD-10-CM | POA: Diagnosis not present

## 2018-08-04 DIAGNOSIS — E7800 Pure hypercholesterolemia, unspecified: Secondary | ICD-10-CM | POA: Diagnosis not present

## 2018-08-04 DIAGNOSIS — J449 Chronic obstructive pulmonary disease, unspecified: Secondary | ICD-10-CM | POA: Diagnosis not present

## 2018-08-11 ENCOUNTER — Encounter (HOSPITAL_COMMUNITY): Admission: RE | Payer: Self-pay | Source: Home / Self Care

## 2018-08-11 ENCOUNTER — Ambulatory Visit (HOSPITAL_COMMUNITY): Admission: RE | Admit: 2018-08-11 | Payer: Medicare HMO | Source: Home / Self Care | Admitting: Internal Medicine

## 2018-08-11 SURGERY — COLONOSCOPY
Anesthesia: Moderate Sedation

## 2018-08-25 DIAGNOSIS — I10 Essential (primary) hypertension: Secondary | ICD-10-CM | POA: Diagnosis not present

## 2018-08-25 DIAGNOSIS — Z6824 Body mass index (BMI) 24.0-24.9, adult: Secondary | ICD-10-CM | POA: Diagnosis not present

## 2018-08-25 DIAGNOSIS — J449 Chronic obstructive pulmonary disease, unspecified: Secondary | ICD-10-CM | POA: Diagnosis not present

## 2018-08-25 DIAGNOSIS — I701 Atherosclerosis of renal artery: Secondary | ICD-10-CM | POA: Diagnosis not present

## 2018-08-25 DIAGNOSIS — Z299 Encounter for prophylactic measures, unspecified: Secondary | ICD-10-CM | POA: Diagnosis not present

## 2018-08-25 DIAGNOSIS — E7800 Pure hypercholesterolemia, unspecified: Secondary | ICD-10-CM | POA: Diagnosis not present

## 2018-09-07 DIAGNOSIS — F13280 Sedative, hypnotic or anxiolytic dependence with sedative, hypnotic or anxiolytic-induced anxiety disorder: Secondary | ICD-10-CM | POA: Diagnosis not present

## 2018-09-07 DIAGNOSIS — F419 Anxiety disorder, unspecified: Secondary | ICD-10-CM | POA: Diagnosis not present

## 2018-09-20 DIAGNOSIS — G629 Polyneuropathy, unspecified: Secondary | ICD-10-CM | POA: Diagnosis not present

## 2018-09-20 DIAGNOSIS — N390 Urinary tract infection, site not specified: Secondary | ICD-10-CM | POA: Diagnosis not present

## 2018-09-20 DIAGNOSIS — Z299 Encounter for prophylactic measures, unspecified: Secondary | ICD-10-CM | POA: Diagnosis not present

## 2018-09-20 DIAGNOSIS — Z6824 Body mass index (BMI) 24.0-24.9, adult: Secondary | ICD-10-CM | POA: Diagnosis not present

## 2018-09-20 DIAGNOSIS — I10 Essential (primary) hypertension: Secondary | ICD-10-CM | POA: Diagnosis not present

## 2018-09-20 DIAGNOSIS — R300 Dysuria: Secondary | ICD-10-CM | POA: Diagnosis not present

## 2018-09-23 DIAGNOSIS — M130 Polyarthritis, unspecified: Secondary | ICD-10-CM | POA: Diagnosis not present

## 2018-09-23 DIAGNOSIS — M545 Low back pain: Secondary | ICD-10-CM | POA: Diagnosis not present

## 2018-09-23 DIAGNOSIS — M797 Fibromyalgia: Secondary | ICD-10-CM | POA: Diagnosis not present

## 2018-09-23 DIAGNOSIS — M25569 Pain in unspecified knee: Secondary | ICD-10-CM | POA: Diagnosis not present

## 2018-10-29 DIAGNOSIS — F13980 Sedative, hypnotic or anxiolytic use, unspecified with sedative, hypnotic or anxiolytic-induced anxiety disorder: Secondary | ICD-10-CM | POA: Diagnosis not present

## 2018-10-29 DIAGNOSIS — F17200 Nicotine dependence, unspecified, uncomplicated: Secondary | ICD-10-CM | POA: Diagnosis not present

## 2018-11-05 DIAGNOSIS — Z634 Disappearance and death of family member: Secondary | ICD-10-CM | POA: Diagnosis not present

## 2018-11-05 DIAGNOSIS — F13980 Sedative, hypnotic or anxiolytic use, unspecified with sedative, hypnotic or anxiolytic-induced anxiety disorder: Secondary | ICD-10-CM | POA: Diagnosis not present

## 2018-11-05 DIAGNOSIS — F320 Major depressive disorder, single episode, mild: Secondary | ICD-10-CM | POA: Diagnosis not present

## 2018-11-18 DIAGNOSIS — Z79891 Long term (current) use of opiate analgesic: Secondary | ICD-10-CM | POA: Diagnosis not present

## 2018-11-18 DIAGNOSIS — M545 Low back pain: Secondary | ICD-10-CM | POA: Diagnosis not present

## 2018-11-18 DIAGNOSIS — M797 Fibromyalgia: Secondary | ICD-10-CM | POA: Diagnosis not present

## 2018-11-18 DIAGNOSIS — M25569 Pain in unspecified knee: Secondary | ICD-10-CM | POA: Diagnosis not present

## 2018-11-18 DIAGNOSIS — M130 Polyarthritis, unspecified: Secondary | ICD-10-CM | POA: Diagnosis not present

## 2018-11-19 DIAGNOSIS — Z299 Encounter for prophylactic measures, unspecified: Secondary | ICD-10-CM | POA: Diagnosis not present

## 2018-11-19 DIAGNOSIS — R21 Rash and other nonspecific skin eruption: Secondary | ICD-10-CM | POA: Diagnosis not present

## 2018-11-19 DIAGNOSIS — R252 Cramp and spasm: Secondary | ICD-10-CM | POA: Diagnosis not present

## 2018-11-19 DIAGNOSIS — G629 Polyneuropathy, unspecified: Secondary | ICD-10-CM | POA: Diagnosis not present

## 2018-11-19 DIAGNOSIS — M797 Fibromyalgia: Secondary | ICD-10-CM | POA: Diagnosis not present

## 2018-12-03 DIAGNOSIS — Z299 Encounter for prophylactic measures, unspecified: Secondary | ICD-10-CM | POA: Diagnosis not present

## 2018-12-03 DIAGNOSIS — R112 Nausea with vomiting, unspecified: Secondary | ICD-10-CM | POA: Diagnosis not present

## 2018-12-03 DIAGNOSIS — I701 Atherosclerosis of renal artery: Secondary | ICD-10-CM | POA: Diagnosis not present

## 2018-12-03 DIAGNOSIS — J449 Chronic obstructive pulmonary disease, unspecified: Secondary | ICD-10-CM | POA: Diagnosis not present

## 2018-12-03 DIAGNOSIS — M797 Fibromyalgia: Secondary | ICD-10-CM | POA: Diagnosis not present

## 2018-12-16 DIAGNOSIS — M797 Fibromyalgia: Secondary | ICD-10-CM | POA: Diagnosis not present

## 2018-12-16 DIAGNOSIS — M25569 Pain in unspecified knee: Secondary | ICD-10-CM | POA: Diagnosis not present

## 2018-12-16 DIAGNOSIS — M545 Low back pain: Secondary | ICD-10-CM | POA: Diagnosis not present

## 2018-12-16 DIAGNOSIS — M130 Polyarthritis, unspecified: Secondary | ICD-10-CM | POA: Diagnosis not present

## 2018-12-16 DIAGNOSIS — Z79891 Long term (current) use of opiate analgesic: Secondary | ICD-10-CM | POA: Diagnosis not present

## 2018-12-29 DIAGNOSIS — Z299 Encounter for prophylactic measures, unspecified: Secondary | ICD-10-CM | POA: Diagnosis not present

## 2018-12-29 DIAGNOSIS — R350 Frequency of micturition: Secondary | ICD-10-CM | POA: Diagnosis not present

## 2018-12-29 DIAGNOSIS — Z6824 Body mass index (BMI) 24.0-24.9, adult: Secondary | ICD-10-CM | POA: Diagnosis not present

## 2018-12-29 DIAGNOSIS — R112 Nausea with vomiting, unspecified: Secondary | ICD-10-CM | POA: Diagnosis not present

## 2018-12-29 DIAGNOSIS — F419 Anxiety disorder, unspecified: Secondary | ICD-10-CM | POA: Diagnosis not present

## 2018-12-29 DIAGNOSIS — I10 Essential (primary) hypertension: Secondary | ICD-10-CM | POA: Diagnosis not present

## 2019-01-03 DIAGNOSIS — I739 Peripheral vascular disease, unspecified: Secondary | ICD-10-CM | POA: Diagnosis not present

## 2019-01-10 IMAGING — CT CT ANGIO NECK
2 of 7 series · 8 of 33 positions shown · IV contrast (Isovue)
Comparison: Outside sonography findings are not available on the
[HOSPITAL] timeline.

CLINICAL DATA: Discrepant carotid Doppler sonography findings with
regard to degree of RIGHT carotid stenosis.

EXAM:
CT ANGIOGRAPHY NECK
TECHNIQUE: Multidetector CT imaging of the neck was performed using the
standard protocol during bolus administration of intravenous
contrast. Multiplanar CT image reconstructions and MIPs were
obtained to evaluate the vascular anatomy. Carotid stenosis
measurements (when applicable) are obtained utilizing NASCET
criteria, using the distal internal carotid diameter as the
denominator.
CONTRAST:  80 mL Isovue 370.

[Series 4: cta neck · axial · 0.45mm/px · z∈[+72,+144]mm · 2 of 109 slices shown]
[im 37/109  soft-tissue]
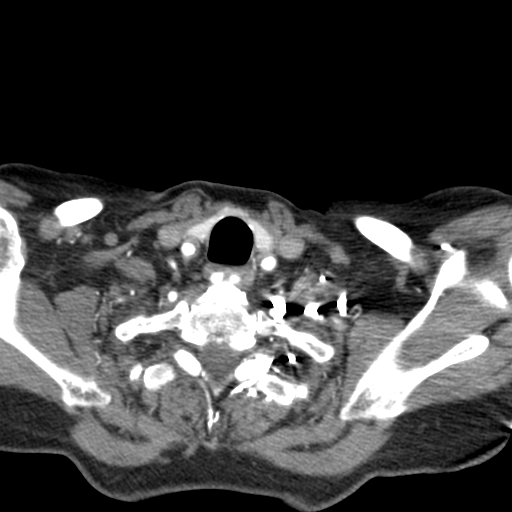
[im 73/109  soft-tissue]
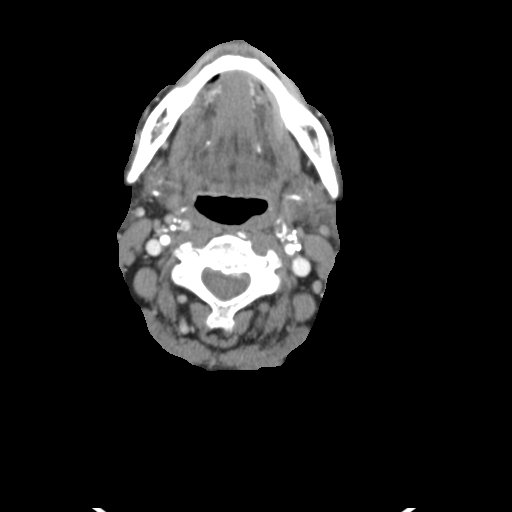

[Series 6: ax thin · axial · 0.30mm/px · z∈[+10,+176]mm · 6 of 236 slices shown]
[im 34/236  soft-tissue]
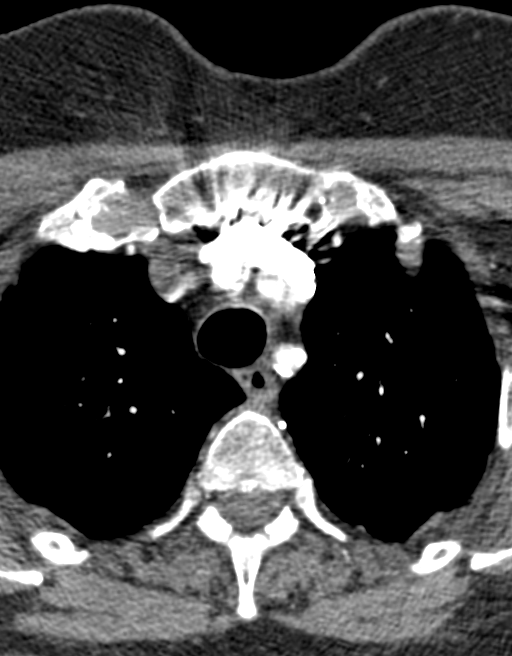
[im 68/236  bone]
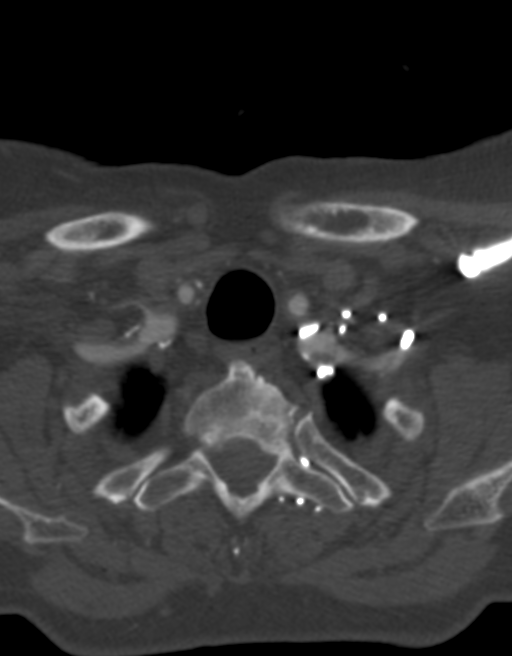
[im 101/236  soft-tissue]
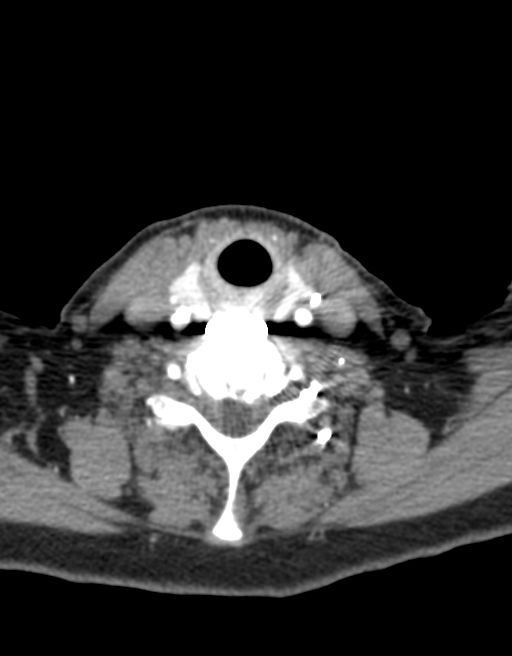
[im 135/236  bone]
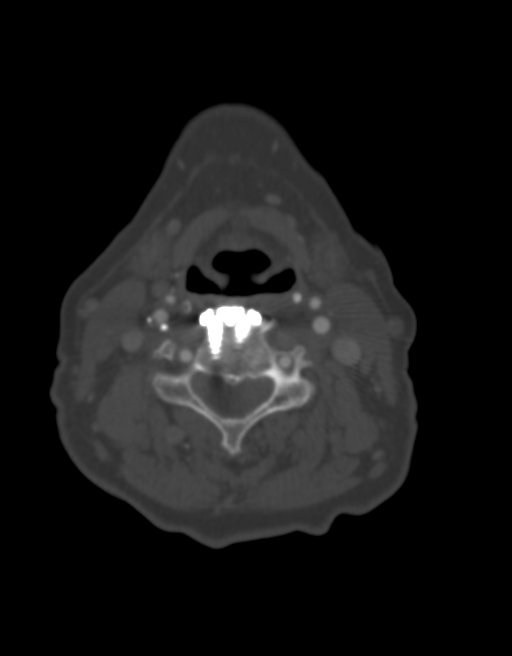
[im 168/236  soft-tissue]
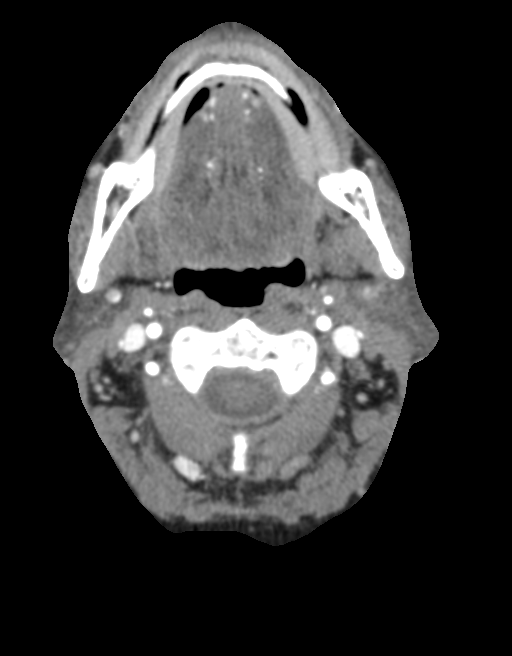
[im 202/236  bone]
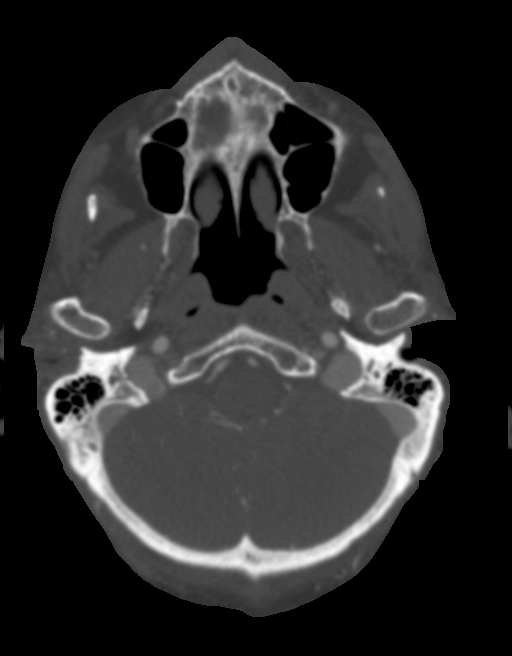

[8 of 33 positions shown; findings below may reference images not displayed]

FINDINGS: Aortic arch: Standard branching. Imaged portion shows no evidence of
aneurysm or dissection. 50% calcific innominate stenosis. 50-75%
calcific stenosis LEFT subclavian. 50-75% stenosis LEFT common
carotid. Non stenotic atherosclerotic change RIGHT subclavian
origin. 50% stenosis RIGHT common carotid origin.

Right carotid system: High-grade calcified and noncalcified stenosis
at the RIGHT carotid bifurcation, with significant luminal narrowing
at the origin of the RIGHT ICA. Measurements of 0.9/4.1 proximal/
distal correspond to a stenosis of 75-80%. No dissection, or
occlusion.

Left carotid system: Non stenotic atheromatous change at the
bifurcation. Potentially flow-limiting stenosis at the LCCA origin
(estimated 50-75%).

Vertebral arteries: BILATERAL patent, RIGHT slightly larger. No
flow-limiting ostial stenosis.

Skeleton: Cervical spondylosis.  Prior ACDF.

Other neck: No masses or adenopathy.

Upper chest: No lung apex lesion.  Thoracic atherosclerosis.
IMPRESSION: High-grade flow-limiting stenosis at the RIGHT internal carotid
artery origin, estimated 75-80%. This consists of both calcific and
soft plaque. See discussion above.

## 2019-01-13 DIAGNOSIS — M130 Polyarthritis, unspecified: Secondary | ICD-10-CM | POA: Diagnosis not present

## 2019-01-13 DIAGNOSIS — Z79891 Long term (current) use of opiate analgesic: Secondary | ICD-10-CM | POA: Diagnosis not present

## 2019-01-13 DIAGNOSIS — M797 Fibromyalgia: Secondary | ICD-10-CM | POA: Diagnosis not present

## 2019-01-13 DIAGNOSIS — M25569 Pain in unspecified knee: Secondary | ICD-10-CM | POA: Diagnosis not present

## 2019-01-13 DIAGNOSIS — M545 Low back pain: Secondary | ICD-10-CM | POA: Diagnosis not present

## 2019-01-25 DIAGNOSIS — F13980 Sedative, hypnotic or anxiolytic use, unspecified with sedative, hypnotic or anxiolytic-induced anxiety disorder: Secondary | ICD-10-CM | POA: Diagnosis not present

## 2019-01-25 DIAGNOSIS — F320 Major depressive disorder, single episode, mild: Secondary | ICD-10-CM | POA: Diagnosis not present

## 2019-02-15 DIAGNOSIS — M170 Bilateral primary osteoarthritis of knee: Secondary | ICD-10-CM | POA: Diagnosis not present

## 2019-02-15 DIAGNOSIS — M25562 Pain in left knee: Secondary | ICD-10-CM | POA: Diagnosis not present

## 2019-02-15 DIAGNOSIS — Z6824 Body mass index (BMI) 24.0-24.9, adult: Secondary | ICD-10-CM | POA: Diagnosis not present

## 2019-02-15 DIAGNOSIS — M25461 Effusion, right knee: Secondary | ICD-10-CM | POA: Diagnosis not present

## 2019-02-15 DIAGNOSIS — M1711 Unilateral primary osteoarthritis, right knee: Secondary | ICD-10-CM | POA: Diagnosis not present

## 2019-02-15 DIAGNOSIS — Z299 Encounter for prophylactic measures, unspecified: Secondary | ICD-10-CM | POA: Diagnosis not present

## 2019-02-15 DIAGNOSIS — Z96652 Presence of left artificial knee joint: Secondary | ICD-10-CM | POA: Diagnosis not present

## 2019-02-15 DIAGNOSIS — I10 Essential (primary) hypertension: Secondary | ICD-10-CM | POA: Diagnosis not present

## 2019-02-15 DIAGNOSIS — M25561 Pain in right knee: Secondary | ICD-10-CM | POA: Diagnosis not present

## 2019-02-15 DIAGNOSIS — I701 Atherosclerosis of renal artery: Secondary | ICD-10-CM | POA: Diagnosis not present

## 2019-03-01 DIAGNOSIS — Z6824 Body mass index (BMI) 24.0-24.9, adult: Secondary | ICD-10-CM | POA: Diagnosis not present

## 2019-03-01 DIAGNOSIS — Z299 Encounter for prophylactic measures, unspecified: Secondary | ICD-10-CM | POA: Diagnosis not present

## 2019-03-01 DIAGNOSIS — I10 Essential (primary) hypertension: Secondary | ICD-10-CM | POA: Diagnosis not present

## 2019-03-01 DIAGNOSIS — M25569 Pain in unspecified knee: Secondary | ICD-10-CM | POA: Diagnosis not present

## 2019-03-01 DIAGNOSIS — Z713 Dietary counseling and surveillance: Secondary | ICD-10-CM | POA: Diagnosis not present

## 2019-03-10 DIAGNOSIS — M130 Polyarthritis, unspecified: Secondary | ICD-10-CM | POA: Diagnosis not present

## 2019-03-10 DIAGNOSIS — M545 Low back pain: Secondary | ICD-10-CM | POA: Diagnosis not present

## 2019-03-10 DIAGNOSIS — M25569 Pain in unspecified knee: Secondary | ICD-10-CM | POA: Diagnosis not present

## 2019-03-10 DIAGNOSIS — Z79891 Long term (current) use of opiate analgesic: Secondary | ICD-10-CM | POA: Diagnosis not present

## 2019-03-10 DIAGNOSIS — M797 Fibromyalgia: Secondary | ICD-10-CM | POA: Diagnosis not present

## 2019-03-14 DIAGNOSIS — Z1231 Encounter for screening mammogram for malignant neoplasm of breast: Secondary | ICD-10-CM | POA: Diagnosis not present

## 2019-08-01 DIAGNOSIS — H31012 Macula scars of posterior pole (postinflammatory) (post-traumatic), left eye: Secondary | ICD-10-CM | POA: Diagnosis not present

## 2019-08-01 DIAGNOSIS — H2512 Age-related nuclear cataract, left eye: Secondary | ICD-10-CM | POA: Diagnosis not present

## 2019-08-01 DIAGNOSIS — H25812 Combined forms of age-related cataract, left eye: Secondary | ICD-10-CM | POA: Diagnosis not present

## 2019-08-01 DIAGNOSIS — H25811 Combined forms of age-related cataract, right eye: Secondary | ICD-10-CM | POA: Diagnosis not present

## 2019-08-01 DIAGNOSIS — Z01818 Encounter for other preprocedural examination: Secondary | ICD-10-CM | POA: Diagnosis not present

## 2019-08-08 DIAGNOSIS — H2511 Age-related nuclear cataract, right eye: Secondary | ICD-10-CM | POA: Diagnosis not present

## 2019-08-08 DIAGNOSIS — H25811 Combined forms of age-related cataract, right eye: Secondary | ICD-10-CM | POA: Diagnosis not present

## 2019-08-22 DIAGNOSIS — M25569 Pain in unspecified knee: Secondary | ICD-10-CM | POA: Diagnosis not present

## 2019-08-22 DIAGNOSIS — G4700 Insomnia, unspecified: Secondary | ICD-10-CM | POA: Diagnosis not present

## 2019-08-22 DIAGNOSIS — M545 Low back pain: Secondary | ICD-10-CM | POA: Diagnosis not present

## 2019-08-22 DIAGNOSIS — M130 Polyarthritis, unspecified: Secondary | ICD-10-CM | POA: Diagnosis not present

## 2019-08-22 DIAGNOSIS — Z79891 Long term (current) use of opiate analgesic: Secondary | ICD-10-CM | POA: Diagnosis not present

## 2019-09-07 DIAGNOSIS — H31012 Macula scars of posterior pole (postinflammatory) (post-traumatic), left eye: Secondary | ICD-10-CM | POA: Diagnosis not present

## 2019-09-26 DIAGNOSIS — I2510 Atherosclerotic heart disease of native coronary artery without angina pectoris: Secondary | ICD-10-CM | POA: Diagnosis not present

## 2019-09-26 DIAGNOSIS — J449 Chronic obstructive pulmonary disease, unspecified: Secondary | ICD-10-CM | POA: Diagnosis not present

## 2019-09-26 DIAGNOSIS — Z299 Encounter for prophylactic measures, unspecified: Secondary | ICD-10-CM | POA: Diagnosis not present

## 2019-09-26 DIAGNOSIS — I10 Essential (primary) hypertension: Secondary | ICD-10-CM | POA: Diagnosis not present

## 2019-09-26 DIAGNOSIS — M545 Low back pain: Secondary | ICD-10-CM | POA: Diagnosis not present

## 2019-10-17 DIAGNOSIS — M25569 Pain in unspecified knee: Secondary | ICD-10-CM | POA: Diagnosis not present

## 2019-10-17 DIAGNOSIS — M130 Polyarthritis, unspecified: Secondary | ICD-10-CM | POA: Diagnosis not present

## 2019-10-17 DIAGNOSIS — M545 Low back pain: Secondary | ICD-10-CM | POA: Diagnosis not present

## 2019-10-17 DIAGNOSIS — G4700 Insomnia, unspecified: Secondary | ICD-10-CM | POA: Diagnosis not present

## 2019-10-17 DIAGNOSIS — Z79891 Long term (current) use of opiate analgesic: Secondary | ICD-10-CM | POA: Diagnosis not present

## 2019-10-17 DIAGNOSIS — M797 Fibromyalgia: Secondary | ICD-10-CM | POA: Diagnosis not present

## 2019-11-11 IMAGING — US US EXTREM LOW VENOUS*L*
1 series · 13 of 24 positions shown · non-contrast
Comparison: None.

CLINICAL DATA: Left lower extremity pain and edema.



[Series 1: us extrem low venous*left* · 13 of 38 slices shown]
[im 1/38]
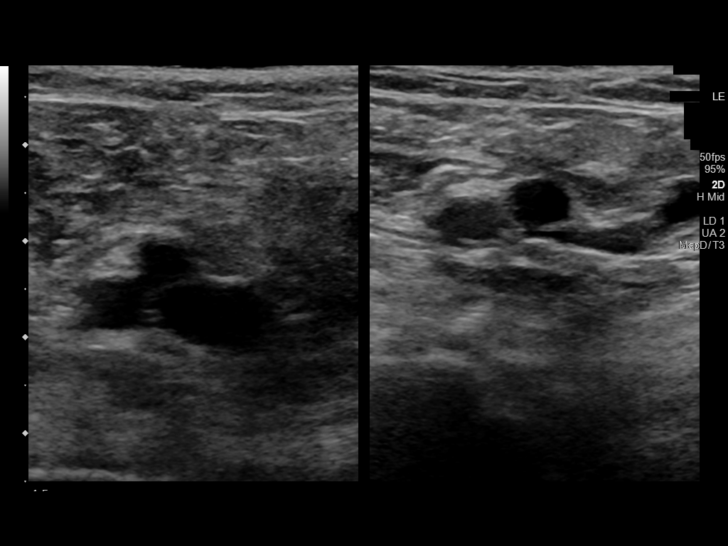
[im 4/38]
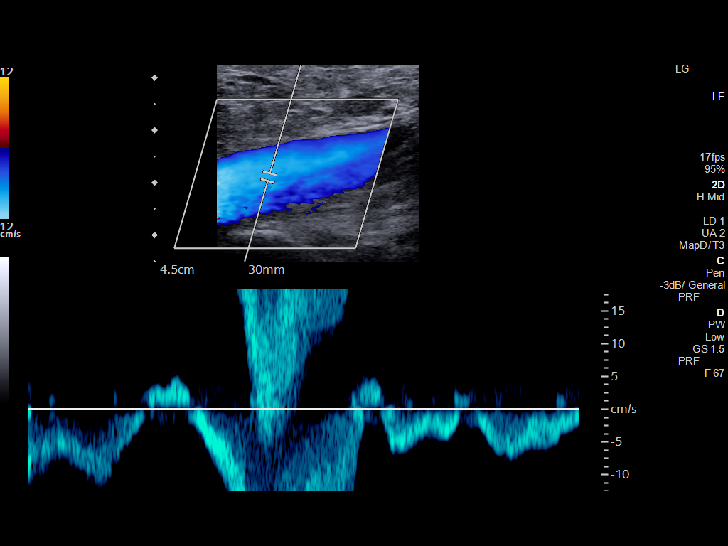
[im 7/38]
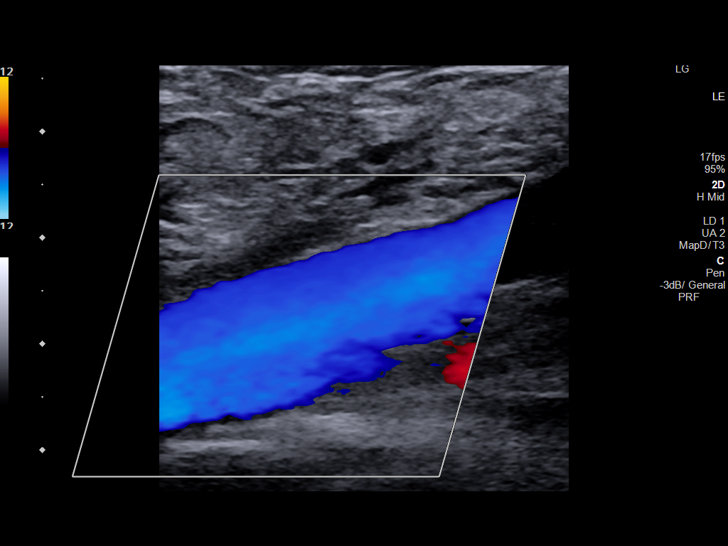
[im 10/38]
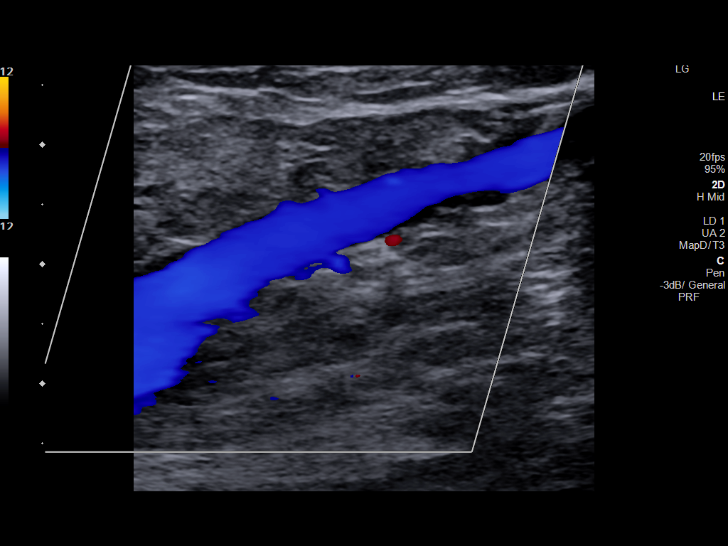
[im 13/38]
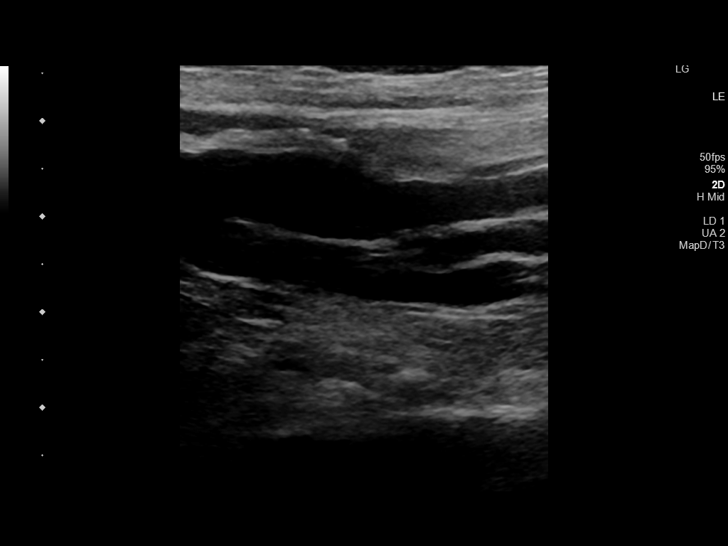
[im 17/38]
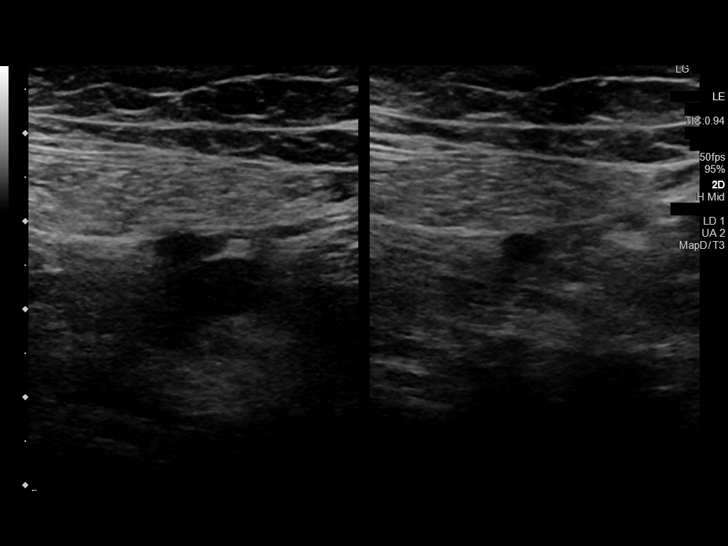
[im 20/38]
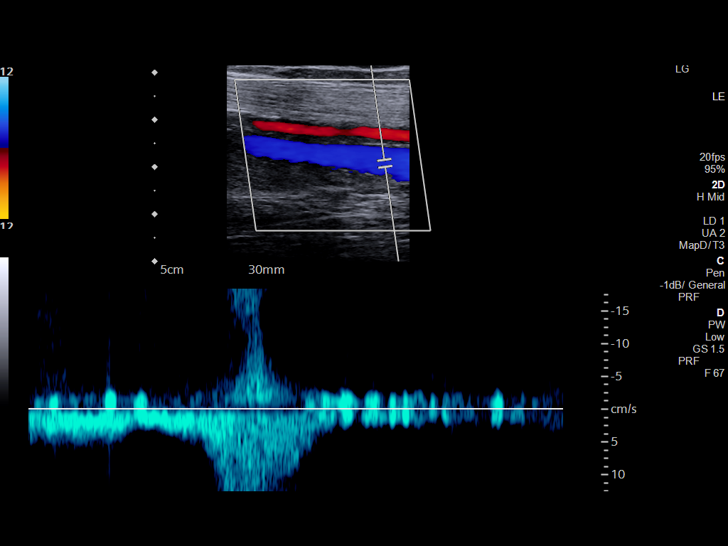
[im 21/38]
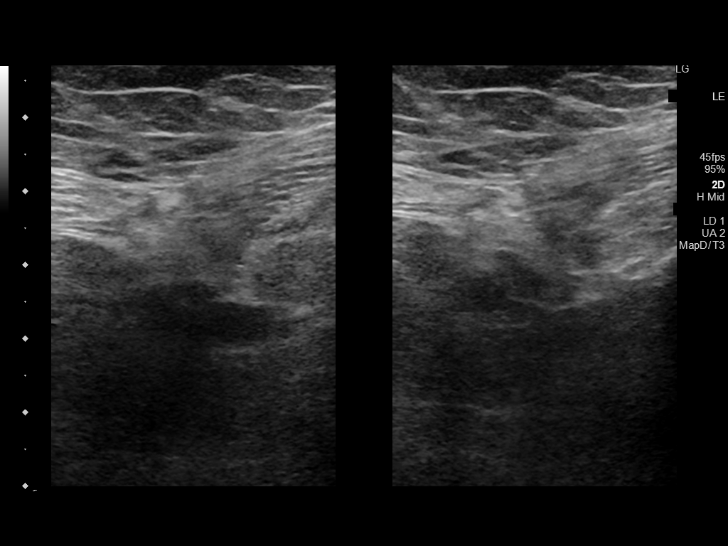
[im 25/38]
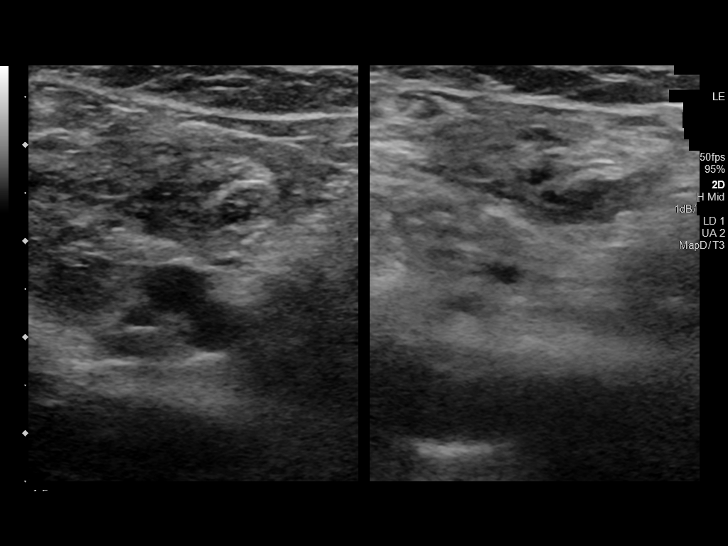
[im 28/38]
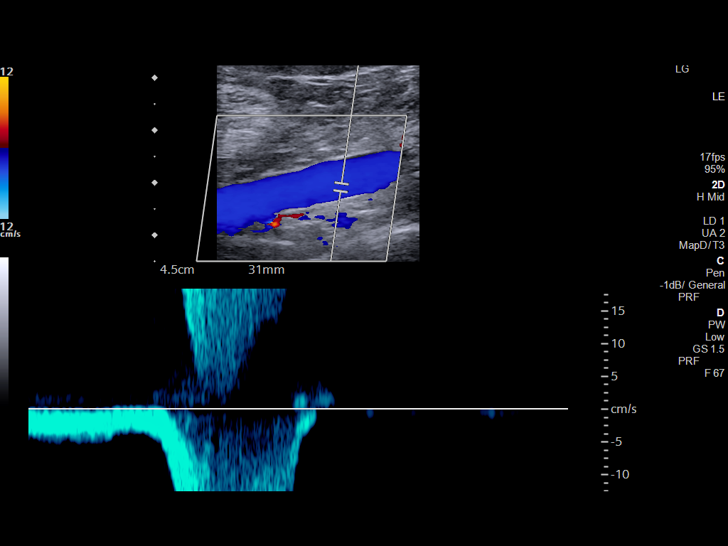
[im 31/38]
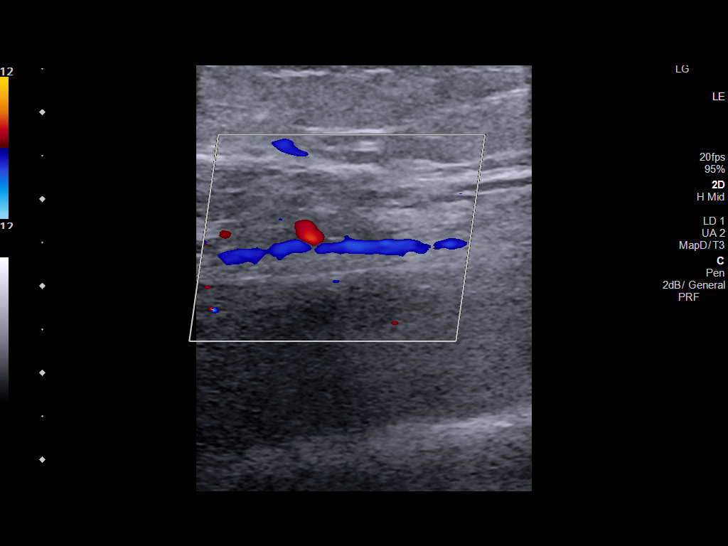
[im 34/38]
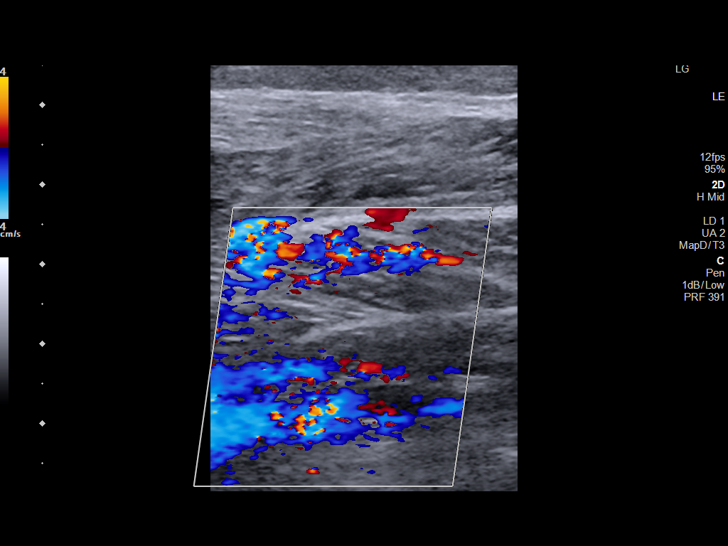
[im 38/38]
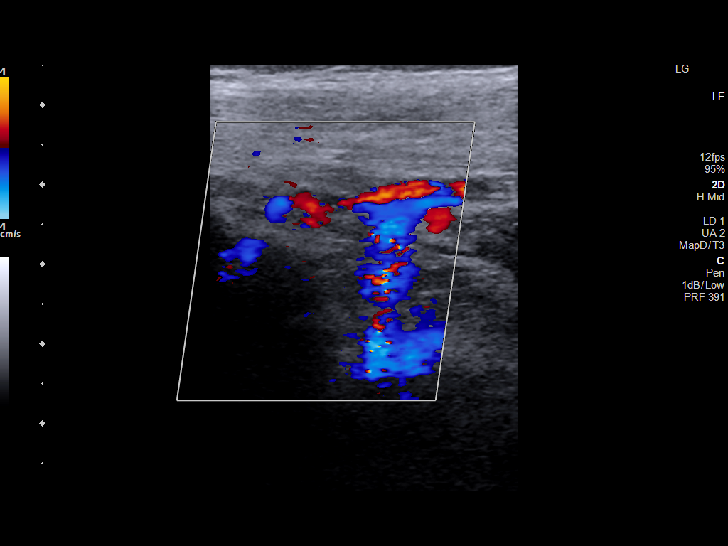

[13 of 24 positions shown; findings below may reference images not displayed]

FINDINGS: Contralateral Common Femoral Vein: Respiratory phasicity is normal
and symmetric with the symptomatic side. No evidence of thrombus.
Normal compressibility.

Common Femoral Vein: No evidence of thrombus. Normal
compressibility, respiratory phasicity and response to augmentation.

Saphenofemoral Junction: No evidence of thrombus. Normal
compressibility and flow on color Doppler imaging.

Profunda Femoral Vein: No evidence of thrombus. Normal
compressibility and flow on color Doppler imaging.

Femoral Vein: No evidence of thrombus. Normal compressibility,
respiratory phasicity and response to augmentation.

Popliteal Vein: No evidence of thrombus. Normal compressibility,
respiratory phasicity and response to augmentation.

Calf Veins: No evidence of thrombus. Normal compressibility and flow
on color Doppler imaging.

Superficial Great Saphenous Vein: No evidence of thrombus. Normal
compressibility.

Venous Reflux:  None.

Other Findings: No evidence of superficial thrombophlebitis or
abnormal fluid collection.
IMPRESSION: No evidence of left lower extremity deep venous thrombosis.

## 2019-12-14 DIAGNOSIS — M130 Polyarthritis, unspecified: Secondary | ICD-10-CM | POA: Diagnosis not present

## 2019-12-14 DIAGNOSIS — M797 Fibromyalgia: Secondary | ICD-10-CM | POA: Diagnosis not present

## 2019-12-14 DIAGNOSIS — M25569 Pain in unspecified knee: Secondary | ICD-10-CM | POA: Diagnosis not present

## 2019-12-14 DIAGNOSIS — Z79891 Long term (current) use of opiate analgesic: Secondary | ICD-10-CM | POA: Diagnosis not present

## 2019-12-14 DIAGNOSIS — M545 Low back pain: Secondary | ICD-10-CM | POA: Diagnosis not present

## 2019-12-14 DIAGNOSIS — G4700 Insomnia, unspecified: Secondary | ICD-10-CM | POA: Diagnosis not present

## 2019-12-27 DIAGNOSIS — R350 Frequency of micturition: Secondary | ICD-10-CM | POA: Diagnosis not present

## 2019-12-27 DIAGNOSIS — I10 Essential (primary) hypertension: Secondary | ICD-10-CM | POA: Diagnosis not present

## 2019-12-27 DIAGNOSIS — M797 Fibromyalgia: Secondary | ICD-10-CM | POA: Diagnosis not present

## 2019-12-27 DIAGNOSIS — N390 Urinary tract infection, site not specified: Secondary | ICD-10-CM | POA: Diagnosis not present

## 2019-12-27 DIAGNOSIS — Z299 Encounter for prophylactic measures, unspecified: Secondary | ICD-10-CM | POA: Diagnosis not present

## 2019-12-27 DIAGNOSIS — J449 Chronic obstructive pulmonary disease, unspecified: Secondary | ICD-10-CM | POA: Diagnosis not present

## 2020-01-11 DIAGNOSIS — M545 Low back pain: Secondary | ICD-10-CM | POA: Diagnosis not present

## 2020-01-11 DIAGNOSIS — G4700 Insomnia, unspecified: Secondary | ICD-10-CM | POA: Diagnosis not present

## 2020-01-11 DIAGNOSIS — M25569 Pain in unspecified knee: Secondary | ICD-10-CM | POA: Diagnosis not present

## 2020-01-11 DIAGNOSIS — M797 Fibromyalgia: Secondary | ICD-10-CM | POA: Diagnosis not present

## 2020-01-11 DIAGNOSIS — Z79891 Long term (current) use of opiate analgesic: Secondary | ICD-10-CM | POA: Diagnosis not present

## 2020-02-06 DIAGNOSIS — N1831 Chronic kidney disease, stage 3a: Secondary | ICD-10-CM | POA: Diagnosis not present

## 2020-02-06 DIAGNOSIS — M25561 Pain in right knee: Secondary | ICD-10-CM | POA: Diagnosis not present

## 2020-02-06 DIAGNOSIS — D692 Other nonthrombocytopenic purpura: Secondary | ICD-10-CM | POA: Diagnosis not present

## 2020-02-06 DIAGNOSIS — J449 Chronic obstructive pulmonary disease, unspecified: Secondary | ICD-10-CM | POA: Diagnosis not present

## 2020-02-06 DIAGNOSIS — Z299 Encounter for prophylactic measures, unspecified: Secondary | ICD-10-CM | POA: Diagnosis not present

## 2020-02-06 DIAGNOSIS — I10 Essential (primary) hypertension: Secondary | ICD-10-CM | POA: Diagnosis not present

## 2020-02-13 DIAGNOSIS — M25561 Pain in right knee: Secondary | ICD-10-CM | POA: Diagnosis not present

## 2020-02-13 DIAGNOSIS — Z299 Encounter for prophylactic measures, unspecified: Secondary | ICD-10-CM | POA: Diagnosis not present

## 2020-02-13 DIAGNOSIS — Z87891 Personal history of nicotine dependence: Secondary | ICD-10-CM | POA: Diagnosis not present

## 2020-02-13 DIAGNOSIS — I10 Essential (primary) hypertension: Secondary | ICD-10-CM | POA: Diagnosis not present

## 2020-02-22 ENCOUNTER — Encounter: Payer: Self-pay | Admitting: Orthopaedic Surgery

## 2020-02-22 ENCOUNTER — Other Ambulatory Visit: Payer: Self-pay

## 2020-02-22 ENCOUNTER — Ambulatory Visit (INDEPENDENT_AMBULATORY_CARE_PROVIDER_SITE_OTHER): Payer: Medicare Other | Admitting: Orthopaedic Surgery

## 2020-02-22 ENCOUNTER — Ambulatory Visit (INDEPENDENT_AMBULATORY_CARE_PROVIDER_SITE_OTHER): Payer: Medicare Other

## 2020-02-22 VITALS — Ht 59.0 in | Wt 130.0 lb

## 2020-02-22 DIAGNOSIS — M1711 Unilateral primary osteoarthritis, right knee: Secondary | ICD-10-CM

## 2020-02-22 DIAGNOSIS — M25561 Pain in right knee: Secondary | ICD-10-CM

## 2020-02-22 DIAGNOSIS — G8929 Other chronic pain: Secondary | ICD-10-CM

## 2020-02-22 NOTE — Progress Notes (Signed)
Office Visit Note   Patient: [name]           Date of Birth: 19-Jun-1951           MRN: [id number] Visit Date: 02/22/2020              Requested by: Glenda Chroman, MD [place],  [place] [id number] PCP: Glenda Chroman, MD   Assessment & Plan: Visit Diagnoses:  1. Chronic pain of right knee   2. Unilateral primary osteoarthritis, right knee     Plan: [name] is accompanied by her daughter and here for another opinion regarding the end-stage osteoarthritis of her right knee.  She has a number of significant comorbidities that would preclude surgery at this point.  We will pre-CERT for Visco supplementation.  Follow-Up Instructions: Return Pre-CERT Visco supplementation.   Orders:  Orders Placed This Encounter  Procedures  . XR KNEE 3 VIEW RIGHT   No orders of the defined types were placed in this encounter.     Procedures: No procedures performed   Clinical Data: No additional findings.   Subjective: Chief Complaint  Patient presents with  . Right Knee - Pain  Patient presents today for right knee pain. She states that she has been hurting for four years. No known injury. Her pain is primarily located anteriorly, medial, and lateral. Her pain is constant. She states that her knee locks up at night. She also experiences grinding, swelling, and giving way. She takes Buprenorphine for pain, as prescribed by her pain management. She has a history of a left total knee arthroplasty that was done several years ago by another physician. She states that she had a heart attack in 2010 and that nobody will operate on her knee due to her history.  Prior left total knee replacement was performed by Dr. Manley Mason at [facility] in about 2006.  She is doing well from that standpoint.  She had a myocardial infarction about 2010 and now has "4 stents".  She also has a stent in my "kidney".  She has been evaluated in [facility] by Dr. Reynaldo Minium for consideration of the right  knee replacement.  He thought that her comorbidities were too significant to proceed.  She is here for another opinion regarding further nonoperative treatment.  She notes that the pain in her right knee is constant.  She does have a history of fibromyalgia.  She is on chronic pain medicines for her fibromyalgia and her knee arthritis and "back arthritis.  Has history of diabetes but she is lost considerable weight and apparently is not taking any diabetic medicines at present.  She is on Plavix HPI  Review of Systems   Objective: Vital Signs: Ht 4\' 11"  (1.499 m)   Wt 130 lb (59 kg)   BMI 26.26 kg/m   Physical Exam Constitutional:      Appearance: She is well-developed.  Eyes:     Pupils: Pupils are equal, round, and reactive to light.  Pulmonary:     Effort: Pulmonary effort is normal.  Skin:    General: Skin is warm and dry.  Neurological:     Mental Status: She is alert and oriented to person, place, and time.  Psychiatric:        Behavior: Behavior normal.     Ortho Exam awake alert and oriented x3.  Comfortable sitting.  Multiple areas of tenderness in both lower extremities.  She has some mild venous stasis changes distally in  both legs with some mild nonpitting edema.  Feet were cool but not cold.  Could not feel pulses.  Does have considerable vascular calcification on her knee films.  Diffuse mild to moderate pain about the right knee in multiple locations.  Minimal effusion.  Full extension and about 95 to 100 degrees of flexion.  Had little opening medially with a valgus stress but I would not consider this instability.  Does have increased valgus with weightbearing . no popliteal or calf pain  Specialty Comments:  No specialty comments available.  Imaging: XR KNEE 3 VIEW RIGHT  Result Date: 02/22/2020 Films of the right knee were obtained in several projections standing.  In addition left knee was obtained in the AP projection and there is a well-seated knee  replacement.  The right knee demonstrates tricompartmental advanced degenerative changes predominate in the lateral compartment with there is significant narrowing of the joint space.  There is normal alignment.  There is subchondral sclerosis more lateral than medial compartment with peripheral osteophytes and small subchondral cyst.  To a lesser extent similar findings are noted medially.  There are considerable degenerative changes at the patellofemoral joint but it does track in the midline with some mild loss of joint spaces both medially and laterally.  There also is some ectopic calcification within the femoral and popliteal arteries.  Films are consistent with advanced or end-stage osteoarthritis in all 3 compartments    PMFS History: Patient Active Problem List   Diagnosis Date Noted  . Unilateral primary osteoarthritis, right knee 02/22/2020  . Gastroesophageal reflux disease without esophagitis 01/25/2018  . History of colonic polyps 03/23/2017  . Melena 01/31/2014  . Aftercare following surgery of the circulatory system, [place] 01/30/2014  . Tooth pain 05/03/2013  . GERD (gastroesophageal reflux disease) 02/16/2013  . Bloating 02/16/2013  . Renal artery stenosis ([place]) 11/04/2012  . PAD (peripheral artery disease) ([place]) 05/07/2012  . Shortness of breath 04/14/2012  . CAROTID OCCLUSIVE DISEASE 11/13/2009  . DYSLIPIDEMIA 04/19/2009  . Essential hypertension, benign 03/12/2009  . CAD, NATIVE VESSEL 03/12/2009   Past Medical History:  Diagnosis Date  . Anxiety   . Arthritis    Rhumatoid and Osteoarthritis  . Carotid artery disease (HCC)    Dr. Trula Slade, 60-79% RICA, s/p left CEA  . Coronary atherosclerosis of native coronary artery    DES RCA/circumflex 4/10, LVEF 65  . Depression   . Essential hypertension, benign   . Fibromyalgia   . GERD (gastroesophageal reflux disease)   . Hiatal hernia   . Hyperlipidemia   . NSTEMI (non-ST elevated myocardial infarction) ([place])    4/10    . Renal artery stenosis (HCC)    Left renal artery stent 12/13  . Restless leg syndrome   . Spondylosis without myelopathy   . Type 2 diabetes mellitus (HCC)     Family History  Problem Relation Age of Onset  . Other Mother        Cerebral hemorrage  . Heart disease Mother   . Heart attack Mother   . Hyperlipidemia Mother   . Heart disease Father   . Heart attack Father   . Hyperlipidemia Father   . Heart disease Brother        Heart Disease before age 71  . Diabetes Brother   . Hypertension Brother   . Heart attack Brother   . Cancer Sister   . Arthritis Sister   . Heart disease Sister   . Heart disease Daughter 38  Heart Disease before age 72  . Heart attack Daughter   . Hypertension Daughter   . Cancer Sister   . Arthritis Sister   . Diabetes Brother        Varicose  Veins  . Peripheral vascular disease Brother   . Diabetes Brother     Past Surgical History:  Procedure Laterality Date  . ABDOMINAL HYSTERECTOMY  1977  . BREAST LUMPECTOMY    . CAROTID ENDARTERECTOMY Left 02/28/2009  . CHOLECYSTECTOMY  1991  . COLONOSCOPY    . COLONOSCOPY N/A 05/08/2017   Procedure: COLONOSCOPY;  Surgeon: Rogene Houston, MD;  [facility];  Service: Endoscopy;  Laterality: N/A;  12:00  . ESOPHAGOGASTRODUODENOSCOPY N/A 04/07/2013   Procedure: ESOPHAGOGASTRODUODENOSCOPY (EGD);  Surgeon: Rogene Houston, MD;  [facility]: [facility];  Service: Endoscopy;  Laterality: N/A;  250  . ESOPHAGOGASTRODUODENOSCOPY N/A 02/02/2014   Procedure: ESOPHAGOGASTRODUODENOSCOPY (EGD);  Surgeon: Rogene Houston, MD;  Location: [facility];  Service: Endoscopy;  Laterality: N/A;  120  . EYE SURGERY     Catartact bilateral  . JOINT REPLACEMENT  March 2013   Left knee  . JOINT REPLACEMENT  2006   Left Knee  . kidney stent     Left December 2013  . LUMBAR DISC SURGERY     L5-S1  . MULTIPLE TOOTH EXTRACTIONS  Jan. 2015  . Neck Fusion  1996   C4 to C6  . PERCUTANEOUS STENT  INTERVENTION Left 06/15/2012   Procedure: PERCUTANEOUS STENT INTERVENTION;  Surgeon: Serafina Mitchell, MD;  Location: [facility] CATH LAB;  Service: Cardiovascular;  Laterality: Left;  renal  . PERIPHERAL VASCULAR BALLOON ANGIOPLASTY  05/26/2017   Procedure: PERIPHERAL VASCULAR BALLOON ANGIOPLASTY;  Surgeon: Serafina Mitchell, MD;  Location: [place] CV LAB;  Service: Cardiovascular;;  Lt. Renal Angioplasty  . POLYPECTOMY  05/08/2017   Procedure: POLYPECTOMY;  Surgeon: Rogene Houston, MD;  Location: [facility];  Service: Endoscopy;;  colon   . RENAL ANGIOGRAM N/A 06/15/2012   Procedure: RENAL ANGIOGRAM;  Surgeon: Serafina Mitchell, MD;  Location: [facility] CATH LAB;  Service: Cardiovascular;  Laterality: N/A;  . RENAL ANGIOGRAPHY  05/26/2017   Procedure: RENAL ANGIOGRAPHY;  Surgeon: Serafina Mitchell, MD;  Location: [place] CV LAB;  Service: Cardiovascular;;  . SPINE SURGERY  1994   Ruptured disc  . TONSILECTOMY, ADENOIDECTOMY, BILATERAL MYRINGOTOMY AND TUBES  1969  . TOTAL KNEE ARTHROPLASTY     Left  . TUBAL LIGATION     Social History   Occupational History  . Not on file  Tobacco Use  . Smoking status: Former Smoker    Years: 45.00    Types: Cigarettes    Quit date: 10/2017    Years since quitting: 2.3  . Smokeless tobacco: Never Used  Vaping Use  . Vaping Use: Never used  Substance and Sexual Activity  . Alcohol use: No    Alcohol/week: 0.0 standard drinks  . Drug use: No  . Sexual activity: Not on file

## 2020-03-07 ENCOUNTER — Other Ambulatory Visit: Payer: Self-pay

## 2020-03-07 ENCOUNTER — Ambulatory Visit: Payer: Medicare Other | Admitting: Orthopaedic Surgery

## 2020-03-10 ENCOUNTER — Other Ambulatory Visit: Payer: Self-pay

## 2020-03-10 ENCOUNTER — Encounter (HOSPITAL_COMMUNITY): Payer: Self-pay | Admitting: Emergency Medicine

## 2020-03-10 ENCOUNTER — Inpatient Hospital Stay (HOSPITAL_COMMUNITY)
Admission: EM | Admit: 2020-03-10 | Discharge: 2020-03-13 | DRG: 885 | Disposition: A | Discharge status: Other Acute Inpatient Hospital | Payer: Medicare Other | Attending: Internal Medicine | Admitting: Internal Medicine

## 2020-03-10 ENCOUNTER — Emergency Department (HOSPITAL_COMMUNITY): Payer: Medicare Other

## 2020-03-10 DIAGNOSIS — R9431 Abnormal electrocardiogram [ECG] [EKG]: Secondary | ICD-10-CM

## 2020-03-10 DIAGNOSIS — N179 Acute kidney failure, unspecified: Secondary | ICD-10-CM | POA: Diagnosis present

## 2020-03-10 DIAGNOSIS — E538 Deficiency of other specified B group vitamins: Secondary | ICD-10-CM | POA: Diagnosis not present

## 2020-03-10 DIAGNOSIS — I10 Essential (primary) hypertension: Secondary | ICD-10-CM | POA: Diagnosis present

## 2020-03-10 DIAGNOSIS — I701 Atherosclerosis of renal artery: Secondary | ICD-10-CM | POA: Diagnosis not present

## 2020-03-10 DIAGNOSIS — Z79899 Other long term (current) drug therapy: Secondary | ICD-10-CM

## 2020-03-10 DIAGNOSIS — I252 Old myocardial infarction: Secondary | ICD-10-CM | POA: Diagnosis not present

## 2020-03-10 DIAGNOSIS — R809 Proteinuria, unspecified: Secondary | ICD-10-CM | POA: Diagnosis present

## 2020-03-10 DIAGNOSIS — D519 Vitamin B12 deficiency anemia, unspecified: Secondary | ICD-10-CM | POA: Diagnosis not present

## 2020-03-10 DIAGNOSIS — M47819 Spondylosis without myelopathy or radiculopathy, site unspecified: Secondary | ICD-10-CM | POA: Diagnosis not present

## 2020-03-10 DIAGNOSIS — G8929 Other chronic pain: Secondary | ICD-10-CM | POA: Diagnosis present

## 2020-03-10 DIAGNOSIS — F22 Delusional disorders: Principal | ICD-10-CM | POA: Diagnosis present

## 2020-03-10 DIAGNOSIS — R443 Hallucinations, unspecified: Secondary | ICD-10-CM | POA: Diagnosis not present

## 2020-03-10 DIAGNOSIS — I2510 Atherosclerotic heart disease of native coronary artery without angina pectoris: Secondary | ICD-10-CM | POA: Diagnosis not present

## 2020-03-10 DIAGNOSIS — M1990 Unspecified osteoarthritis, unspecified site: Secondary | ICD-10-CM | POA: Diagnosis not present

## 2020-03-10 DIAGNOSIS — K449 Diaphragmatic hernia without obstruction or gangrene: Secondary | ICD-10-CM | POA: Diagnosis not present

## 2020-03-10 DIAGNOSIS — Z8249 Family history of ischemic heart disease and other diseases of the circulatory system: Secondary | ICD-10-CM

## 2020-03-10 DIAGNOSIS — Z95828 Presence of other vascular implants and grafts: Secondary | ICD-10-CM

## 2020-03-10 DIAGNOSIS — M069 Rheumatoid arthritis, unspecified: Secondary | ICD-10-CM | POA: Diagnosis not present

## 2020-03-10 DIAGNOSIS — R824 Acetonuria: Secondary | ICD-10-CM | POA: Diagnosis present

## 2020-03-10 DIAGNOSIS — E785 Hyperlipidemia, unspecified: Secondary | ICD-10-CM | POA: Diagnosis present

## 2020-03-10 DIAGNOSIS — K219 Gastro-esophageal reflux disease without esophagitis: Secondary | ICD-10-CM | POA: Diagnosis present

## 2020-03-10 DIAGNOSIS — Z96652 Presence of left artificial knee joint: Secondary | ICD-10-CM | POA: Diagnosis present

## 2020-03-10 DIAGNOSIS — E876 Hypokalemia: Secondary | ICD-10-CM | POA: Diagnosis present

## 2020-03-10 DIAGNOSIS — T50915A Adverse effect of multiple unspecified drugs, medicaments and biological substances, initial encounter: Secondary | ICD-10-CM | POA: Diagnosis present

## 2020-03-10 DIAGNOSIS — Z83438 Family history of other disorder of lipoprotein metabolism and other lipidemia: Secondary | ICD-10-CM

## 2020-03-10 DIAGNOSIS — N19 Unspecified kidney failure: Secondary | ICD-10-CM

## 2020-03-10 DIAGNOSIS — Z833 Family history of diabetes mellitus: Secondary | ICD-10-CM

## 2020-03-10 DIAGNOSIS — Z7902 Long term (current) use of antithrombotics/antiplatelets: Secondary | ICD-10-CM

## 2020-03-10 DIAGNOSIS — D593 Hemolytic-uremic syndrome: Secondary | ICD-10-CM | POA: Diagnosis not present

## 2020-03-10 DIAGNOSIS — R823 Hemoglobinuria: Secondary | ICD-10-CM | POA: Diagnosis present

## 2020-03-10 DIAGNOSIS — M797 Fibromyalgia: Secondary | ICD-10-CM | POA: Diagnosis present

## 2020-03-10 DIAGNOSIS — G2581 Restless legs syndrome: Secondary | ICD-10-CM | POA: Diagnosis not present

## 2020-03-10 DIAGNOSIS — Z885 Allergy status to narcotic agent status: Secondary | ICD-10-CM | POA: Diagnosis not present

## 2020-03-10 DIAGNOSIS — Z20822 Contact with and (suspected) exposure to covid-19: Secondary | ICD-10-CM | POA: Diagnosis present

## 2020-03-10 DIAGNOSIS — Z91048 Other nonmedicinal substance allergy status: Secondary | ICD-10-CM

## 2020-03-10 DIAGNOSIS — R4182 Altered mental status, unspecified: Secondary | ICD-10-CM | POA: Diagnosis present

## 2020-03-10 DIAGNOSIS — Z8261 Family history of arthritis: Secondary | ICD-10-CM

## 2020-03-10 DIAGNOSIS — F419 Anxiety disorder, unspecified: Secondary | ICD-10-CM | POA: Diagnosis present

## 2020-03-10 DIAGNOSIS — Z888 Allergy status to other drugs, medicaments and biological substances status: Secondary | ICD-10-CM

## 2020-03-10 DIAGNOSIS — F1120 Opioid dependence, uncomplicated: Secondary | ICD-10-CM | POA: Diagnosis present

## 2020-03-10 DIAGNOSIS — Z87891 Personal history of nicotine dependence: Secondary | ICD-10-CM

## 2020-03-10 DIAGNOSIS — R21 Rash and other nonspecific skin eruption: Secondary | ICD-10-CM | POA: Diagnosis not present

## 2020-03-10 DIAGNOSIS — F329 Major depressive disorder, single episode, unspecified: Secondary | ICD-10-CM | POA: Diagnosis present

## 2020-03-10 LAB — COMPREHENSIVE METABOLIC PANEL
ALT: 16 U/L (ref 0–44)
AST: 25 U/L (ref 15–41)
Albumin: 5.2 g/dL — ABNORMAL HIGH (ref 3.5–5.0)
Alkaline Phosphatase: 77 U/L (ref 38–126)
Anion gap: 14 (ref 5–15)
BUN: 65 mg/dL — ABNORMAL HIGH (ref 8–23)
CO2: 23 mmol/L (ref 22–32)
Calcium: 9.4 mg/dL (ref 8.9–10.3)
Chloride: 101 mmol/L (ref 98–111)
Creatinine, Ser: 1.66 mg/dL — ABNORMAL HIGH (ref 0.44–1.00)
GFR calc Af Amer: 36 mL/min — ABNORMAL LOW (ref >60)
GFR calc non Af Amer: 31 mL/min — ABNORMAL LOW (ref >60)
Glucose, Bld: 167 mg/dL — ABNORMAL HIGH (ref 70–99)
Potassium: 2.8 mmol/L — ABNORMAL LOW (ref 3.5–5.1)
Sodium: 138 mmol/L (ref 135–145)
Total Bilirubin: 1.2 mg/dL (ref 0.3–1.2)
Total Protein: 8.5 g/dL — ABNORMAL HIGH (ref 6.5–8.1)

## 2020-03-10 LAB — SARS CORONAVIRUS 2 BY RT PCR (HOSPITAL ORDER, PERFORMED IN ~~LOC~~ HOSPITAL LAB): SARS Coronavirus 2: NEGATIVE

## 2020-03-10 LAB — URINALYSIS, ROUTINE W REFLEX MICROSCOPIC
Bilirubin Urine: NEGATIVE
Glucose, UA: NEGATIVE mg/dL
Ketones, ur: 5 mg/dL — AB
Nitrite: NEGATIVE
Protein, ur: 100 mg/dL — AB
Specific Gravity, Urine: 1.019 (ref 1.005–1.030)
pH: 5 (ref 5.0–8.0)

## 2020-03-10 LAB — CBC WITH DIFFERENTIAL/PLATELET
Abs Immature Granulocytes: 0.02 10*3/uL (ref 0.00–0.07)
Basophils Absolute: 0.1 10*3/uL (ref 0.0–0.1)
Basophils Relative: 1 %
Eosinophils Absolute: 0 10*3/uL (ref 0.0–0.5)
Eosinophils Relative: 0 %
HCT: 45.4 % (ref 36.0–46.0)
Hemoglobin: 15.4 g/dL — ABNORMAL HIGH (ref 12.0–15.0)
Immature Granulocytes: 0 %
Lymphocytes Relative: 19 %
Lymphs Abs: 1.6 10*3/uL (ref 0.7–4.0)
MCH: 31.3 pg (ref 26.0–34.0)
MCHC: 33.9 g/dL (ref 30.0–36.0)
MCV: 92.3 fL (ref 80.0–100.0)
Monocytes Absolute: 0.7 10*3/uL (ref 0.1–1.0)
Monocytes Relative: 8 %
Neutro Abs: 6.3 10*3/uL (ref 1.7–7.7)
Neutrophils Relative %: 72 %
Platelets: 203 10*3/uL (ref 150–400)
RBC: 4.92 MIL/uL (ref 3.87–5.11)
RDW: 12.4 % (ref 11.5–15.5)
WBC: 8.7 10*3/uL (ref 4.0–10.5)
nRBC: 0 % (ref 0.0–0.2)

## 2020-03-10 MED ORDER — SODIUM CHLORIDE 0.9 % IV BOLUS
1000.0000 mL | Freq: Once | INTRAVENOUS | Status: AC
  Administered 2020-03-10: 1000 mL via INTRAVENOUS

## 2020-03-10 MED ORDER — POTASSIUM CHLORIDE CRYS ER 20 MEQ PO TBCR
40.0000 meq | EXTENDED_RELEASE_TABLET | Freq: Once | ORAL | Status: AC
  Administered 2020-03-10: 40 meq via ORAL
  Filled 2020-03-10: qty 2

## 2020-03-10 MED ORDER — MAGNESIUM SULFATE 2 GM/50ML IV SOLN
2.0000 g | Freq: Once | INTRAVENOUS | Status: AC
  Administered 2020-03-11: 2 g via INTRAVENOUS
  Filled 2020-03-10: qty 50

## 2020-03-10 MED ORDER — ACETAMINOPHEN 325 MG PO TABS
650.0000 mg | ORAL_TABLET | Freq: Once | ORAL | Status: AC
  Administered 2020-03-10: 650 mg via ORAL
  Filled 2020-03-10: qty 2

## 2020-03-10 MED ORDER — POTASSIUM CHLORIDE 10 MEQ/100ML IV SOLN
10.0000 meq | Freq: Once | INTRAVENOUS | Status: AC
  Administered 2020-03-10: 10 meq via INTRAVENOUS
  Filled 2020-03-10: qty 100

## 2020-03-10 NOTE — ED Notes (Signed)
ED Provider at bedside. 
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
## 2020-03-11 ENCOUNTER — Encounter (HOSPITAL_COMMUNITY): Payer: Self-pay | Admitting: Internal Medicine

## 2020-03-11 ENCOUNTER — Observation Stay (HOSPITAL_COMMUNITY): Payer: Medicare Other

## 2020-03-11 DIAGNOSIS — M1990 Unspecified osteoarthritis, unspecified site: Secondary | ICD-10-CM | POA: Diagnosis present

## 2020-03-11 DIAGNOSIS — R9431 Abnormal electrocardiogram [ECG] [EKG]: Secondary | ICD-10-CM

## 2020-03-11 DIAGNOSIS — K449 Diaphragmatic hernia without obstruction or gangrene: Secondary | ICD-10-CM | POA: Diagnosis present

## 2020-03-11 DIAGNOSIS — F1120 Opioid dependence, uncomplicated: Secondary | ICD-10-CM | POA: Diagnosis present

## 2020-03-11 DIAGNOSIS — Z888 Allergy status to other drugs, medicaments and biological substances status: Secondary | ICD-10-CM | POA: Diagnosis not present

## 2020-03-11 DIAGNOSIS — Z7902 Long term (current) use of antithrombotics/antiplatelets: Secondary | ICD-10-CM | POA: Diagnosis not present

## 2020-03-11 DIAGNOSIS — I701 Atherosclerosis of renal artery: Secondary | ICD-10-CM | POA: Diagnosis present

## 2020-03-11 DIAGNOSIS — M069 Rheumatoid arthritis, unspecified: Secondary | ICD-10-CM | POA: Diagnosis present

## 2020-03-11 DIAGNOSIS — Z885 Allergy status to narcotic agent status: Secondary | ICD-10-CM | POA: Diagnosis not present

## 2020-03-11 DIAGNOSIS — R4182 Altered mental status, unspecified: Secondary | ICD-10-CM | POA: Diagnosis present

## 2020-03-11 DIAGNOSIS — R443 Hallucinations, unspecified: Secondary | ICD-10-CM | POA: Diagnosis not present

## 2020-03-11 DIAGNOSIS — M797 Fibromyalgia: Secondary | ICD-10-CM | POA: Diagnosis present

## 2020-03-11 DIAGNOSIS — N179 Acute kidney failure, unspecified: Secondary | ICD-10-CM | POA: Diagnosis not present

## 2020-03-11 DIAGNOSIS — R9082 White matter disease, unspecified: Secondary | ICD-10-CM | POA: Diagnosis not present

## 2020-03-11 DIAGNOSIS — F22 Delusional disorders: Secondary | ICD-10-CM | POA: Diagnosis present

## 2020-03-11 DIAGNOSIS — F329 Major depressive disorder, single episode, unspecified: Secondary | ICD-10-CM | POA: Diagnosis present

## 2020-03-11 DIAGNOSIS — E876 Hypokalemia: Secondary | ICD-10-CM | POA: Diagnosis not present

## 2020-03-11 DIAGNOSIS — K219 Gastro-esophageal reflux disease without esophagitis: Secondary | ICD-10-CM | POA: Diagnosis present

## 2020-03-11 DIAGNOSIS — E785 Hyperlipidemia, unspecified: Secondary | ICD-10-CM | POA: Diagnosis not present

## 2020-03-11 DIAGNOSIS — D519 Vitamin B12 deficiency anemia, unspecified: Secondary | ICD-10-CM | POA: Diagnosis not present

## 2020-03-11 DIAGNOSIS — I10 Essential (primary) hypertension: Secondary | ICD-10-CM | POA: Diagnosis not present

## 2020-03-11 DIAGNOSIS — G2581 Restless legs syndrome: Secondary | ICD-10-CM | POA: Diagnosis present

## 2020-03-11 DIAGNOSIS — I2510 Atherosclerotic heart disease of native coronary artery without angina pectoris: Secondary | ICD-10-CM | POA: Diagnosis present

## 2020-03-11 DIAGNOSIS — F419 Anxiety disorder, unspecified: Secondary | ICD-10-CM | POA: Diagnosis present

## 2020-03-11 DIAGNOSIS — E538 Deficiency of other specified B group vitamins: Secondary | ICD-10-CM | POA: Diagnosis present

## 2020-03-11 DIAGNOSIS — F32A Depression, unspecified: Secondary | ICD-10-CM | POA: Insufficient documentation

## 2020-03-11 DIAGNOSIS — I252 Old myocardial infarction: Secondary | ICD-10-CM | POA: Diagnosis not present

## 2020-03-11 DIAGNOSIS — G8929 Other chronic pain: Secondary | ICD-10-CM | POA: Diagnosis present

## 2020-03-11 DIAGNOSIS — M47819 Spondylosis without myelopathy or radiculopathy, site unspecified: Secondary | ICD-10-CM | POA: Diagnosis present

## 2020-03-11 DIAGNOSIS — Z20822 Contact with and (suspected) exposure to covid-19: Secondary | ICD-10-CM | POA: Diagnosis not present

## 2020-03-11 LAB — CBC
HCT: 37.3 % (ref 36.0–46.0)
Hemoglobin: 13 g/dL (ref 12.0–15.0)
MCH: 32.2 pg (ref 26.0–34.0)
MCHC: 34.9 g/dL (ref 30.0–36.0)
MCV: 92.3 fL (ref 80.0–100.0)
Platelets: 154 10*3/uL (ref 150–400)
RBC: 4.04 MIL/uL (ref 3.87–5.11)
RDW: 12.4 % (ref 11.5–15.5)
WBC: 6.4 10*3/uL (ref 4.0–10.5)
nRBC: 0 % (ref 0.0–0.2)

## 2020-03-11 LAB — COMPREHENSIVE METABOLIC PANEL
ALT: 14 U/L (ref 0–44)
AST: 19 U/L (ref 15–41)
Albumin: 3.7 g/dL (ref 3.5–5.0)
Alkaline Phosphatase: 58 U/L (ref 38–126)
Anion gap: 8 (ref 5–15)
BUN: 48 mg/dL — ABNORMAL HIGH (ref 8–23)
CO2: 22 mmol/L (ref 22–32)
Calcium: 8.3 mg/dL — ABNORMAL LOW (ref 8.9–10.3)
Chloride: 107 mmol/L (ref 98–111)
Creatinine, Ser: 1.16 mg/dL — ABNORMAL HIGH (ref 0.44–1.00)
GFR calc Af Amer: 55 mL/min — ABNORMAL LOW (ref >60)
GFR calc non Af Amer: 47 mL/min — ABNORMAL LOW (ref >60)
Glucose, Bld: 96 mg/dL (ref 70–99)
Potassium: 3.6 mmol/L (ref 3.5–5.1)
Sodium: 137 mmol/L (ref 135–145)
Total Bilirubin: 1 mg/dL (ref 0.3–1.2)
Total Protein: 6.2 g/dL — ABNORMAL LOW (ref 6.5–8.1)

## 2020-03-11 LAB — PHOSPHORUS: Phosphorus: 4 mg/dL (ref 2.5–4.6)

## 2020-03-11 LAB — MAGNESIUM: Magnesium: 2.5 mg/dL — ABNORMAL HIGH (ref 1.7–2.4)

## 2020-03-11 MED ORDER — ONDANSETRON HCL 4 MG PO TABS: 4.0000 mg | ORAL_TABLET | Freq: Four times a day (QID) | ORAL | Status: DC | PRN

## 2020-03-11 MED ORDER — ACETAMINOPHEN 650 MG RE SUPP: 650.0000 mg | Freq: Four times a day (QID) | RECTAL | Status: DC | PRN

## 2020-03-11 MED ORDER — METOPROLOL TARTRATE 5 MG/5ML IV SOLN
5.0000 mg | Freq: Once | INTRAVENOUS | Status: AC
  Administered 2020-03-11: 5 mg via INTRAVENOUS
  Filled 2020-03-11: qty 5

## 2020-03-11 MED ORDER — HYDRALAZINE HCL 25 MG PO TABS
50.0000 mg | ORAL_TABLET | Freq: Four times a day (QID) | ORAL | Status: DC | PRN
  Administered 2020-03-11 – 2020-03-13 (×5): 50 mg via ORAL
  Filled 2020-03-11 (×5): qty 2

## 2020-03-11 MED ORDER — ACETAMINOPHEN 325 MG PO TABS
650.0000 mg | ORAL_TABLET | Freq: Four times a day (QID) | ORAL | Status: DC | PRN
  Administered 2020-03-12 – 2020-03-13 (×2): 650 mg via ORAL
  Filled 2020-03-11 (×2): qty 2

## 2020-03-11 MED ORDER — PROCHLORPERAZINE EDISYLATE 10 MG/2ML IJ SOLN: 5.0000 mg | INTRAMUSCULAR | Status: DC | PRN

## 2020-03-11 MED ORDER — ONDANSETRON HCL 4 MG/2ML IJ SOLN: 4.0000 mg | Freq: Four times a day (QID) | INTRAMUSCULAR | Status: DC | PRN

## 2020-03-11 MED ORDER — POTASSIUM CHLORIDE IN NACL 20-0.45 MEQ/L-% IV SOLN
INTRAVENOUS | Status: DC
  Filled 2020-03-11 (×5): qty 1000

## 2020-03-11 MED ORDER — METOPROLOL SUCCINATE ER 25 MG PO TB24
25.0000 mg | ORAL_TABLET | Freq: Every day | ORAL | Status: DC
  Administered 2020-03-12 – 2020-03-13 (×2): 25 mg via ORAL
  Filled 2020-03-11 (×2): qty 1

## 2020-03-11 MED ORDER — ALPRAZOLAM 0.5 MG PO TABS
0.5000 mg | ORAL_TABLET | Freq: Every evening | ORAL | Status: AC | PRN
  Administered 2020-03-11 – 2020-03-12 (×2): 0.5 mg via ORAL
  Filled 2020-03-11 (×2): qty 1

## 2020-03-11 MED ORDER — ENOXAPARIN SODIUM 30 MG/0.3ML ~~LOC~~ SOLN
30.0000 mg | SUBCUTANEOUS | Status: DC
  Filled 2020-03-11: qty 0.3

## 2020-03-11 NOTE — ED Notes (Signed)
PT at bedside at this time 

## 2020-03-11 NOTE — Care Management (Signed)
Noted consult for HH/DME needs. Patient lives alone, per PT note patient reports walking with a cane, complete evaluation from PT still pending.  TOC will follow for TTS evaluation and will address PT recommendations.

## 2020-03-11 NOTE — Progress Notes (Signed)
PT Cancellation Note  Patient Details Name: [name] MRN: [id number] DOB: 16-Jan-1951   Cancelled Treatment:      Began Physical therapy evaluation asking patient subjective questions patient reporting that she lives in an apartment on the 1st floor without any stairs, that she has a tub shower with grab bars. She states that she lives alone and that she uses a cane most of the time. Then evaluation was stopped due to patient being taken to CT. Will attempt again later, time permitting.   Clarene Critchley PT, DPT 10:58 AM, 03/11/20 [phone]

## 2020-03-11 NOTE — Progress Notes (Signed)
PT Cancellation Note  Patient Details Name: JASKIRAN PATA MRN: [id number] DOB: 1950/11/22   Cancelled Treatment:      Second physical therapy evaluation attempt. Computer brought in during session and nurse reporting patient is to have a telehealth consult. She also reported that patient is likely going to stay the night at the hospital and therapy evaluation can be completed tomorrow.    Clarene Critchley PT, DPT 12:06 PM, 03/11/20 [phone]

## 2020-03-11 NOTE — ED Notes (Signed)
Daughter updated at this time and given information on how to contact 3rd floor nursing staff if needed.

## 2020-03-11 NOTE — ED Notes (Signed)
TTS completed at this time.

## 2020-03-11 NOTE — Care Management Obs Status (Signed)
[place] NOTIFICATION   Patient Details  Name: [name] MRN: [id number] Date of Birth: 08/21/1950   Medicare Observation Status Notification Given:  Yes    Shiniqua Groseclose, Chauncey Reading, RN 03/11/2020, 11:45 AM

## 2020-03-11 NOTE — Discharge Summary (Deleted)
Physician Discharge Summary  [name] GNF:[id number] DOB: 1950-09-21 DOA: 03/10/2020  PCP: Glenda Chroman, MD  Admit date: 03/10/2020 Discharge date: 03/13/2020  Admitted From: Home Disposition: Inpatient psychiatric hospital  Recommendations for Outpatient Follow-up:  1. Follow up with provider at inpatient psychiatric hospital  2. follow up in ED if symptoms worsen or new appear   Home Health: No Equipment/Devices: None  Discharge Condition: Stable CODE STATUS: Full Diet recommendation: Heart healthy  Brief/Interim Summary: 69 y.o.femalewith medical history significant ofanxiety, depression, osteoarthritis, rheumatoid arthritis, carotid artery disease, coronary atherosclerosis, history of NSTEMI, left renal artery stenosis and stent placement, hypertension, hyperlipidemia, PAD, fibromyalgia, GERD, hiatal hernia, restless leg syndrome, spondylosis without myelopathy who lives alone presented with paranoia and hallucinations.  On presentation, UA showed 6-10 WBCs; creatinine was 1.66.  Covid testing was negative.  Chest x-ray showed no acute abnormality.  She was started on IV fluids.  She initially refused CT head testing.  She was subsequently evaluated by TTS who recommended inpatient psychiatric hospitalization.  She will be discharged to inpatient psychiatric hospital once bed is available  Discharge Diagnoses:  Acute paranoia/hallucinations History of depression/anxiety/fibromyalgia History of chronic pain/opioid dependence -Questionable cause.  Patient presented with hallucinations/paranoia. -TTS consulted who is recommending inpatient psychiatric hospitalization.  Patient is medically stable for discharge to the same. -Patient is on a number of medications that can cause altered mental status including Subutex/scheduled Valium/Lyrica/Phenergan: These have been held and need to be addressed by psychiatry/PCP/neurology -CT of the brain was negative for acute intracranial  abnormality. -No evidence of any infection: Chest x-ray negative for acute abnormality.  UA only showed 6-10 WBCs.  Patient is afebrile and has no symptoms of UTI. -Monitor mental status.  Fall precautions. -Folate 15.1.  Ammonia 29.  TSH 1.791 - She will be discharged to inpatient psychiatric hospital once bed is available  Vitamin B12 deficiency -Vitamin B12 level 105.  Currently being treated parenterally while in the hospital and will need oral supplementation on discharge.  Acute kidney injury -Treated with IV fluids.  Improved.  Encourage oral intake.  Of IV fluids  Hypokalemia -Improved  Hyperlipidemia -Has allergies to statin  Essential hypertension -Monitor blood pressure.  Resume triamterene hydrochlorothiazide on discharge as renal function has improved.  CAD -Continue Plavix.  Outpatient follow-up with cardiology.  Discharge Instructions   Allergies as of 03/13/2020      Reactions   Celecoxib Itching   Codeine Itching   Dicyclomine Diarrhea, Other (See Comments)   Made diarrhea worse   Lamictal [lamotrigine] Other (See Comments)   Unknown   Metoclopramide Hcl Other (See Comments)   Dizziness    Savella [milnacipran Hcl] Other (See Comments)   Unknown    Simvastatin    Increased back and muscle pain   Tape Other (See Comments)   Skin gets red and skin pulls off, paper tape only   Trazodone And Nefazodone Other (See Comments)   hallucinations   Venlafaxine Other (See Comments)   Affected eyes, blurred vision   Citalopram Palpitations, Other (See Comments)   Heart rate increased   Sertraline Hcl Other (See Comments)   Stomach upset      Medication List    STOP taking these medications   buprenorphine 8 MG Subl SL tablet Commonly known as: SUBUTEX   DayVigo 5 MG Tabs Generic drug: Lemborexant   diazepam 5 MG tablet Commonly known as: VALIUM   pregabalin 50 MG capsule Commonly known as: LYRICA   promethazine 25 MG tablet  Commonly known  as: PHENERGAN   simvastatin 40 MG tablet Commonly known as: ZOCOR     TAKE these medications   ALIVE WOMENS GUMMY PO Take 1 Dose by mouth daily.   clopidogrel 75 MG tablet Commonly known as: PLAVIX Take 75 mg daily after breakfast by mouth.   fluticasone 50 MCG/ACT nasal spray Commonly known as: FLONASE Place 2 sprays into both nostrils daily as needed for allergies.   levothyroxine 25 MCG tablet Commonly known as: SYNTHROID Take 25 mcg by mouth daily.   nitroGLYCERIN 0.4 MG SL tablet Commonly known as: NITROSTAT Place 0.4 mg under the tongue every 5 (five) minutes as needed for chest pain.   omeprazole 20 MG capsule Commonly known as: PRILOSEC Take 20 mg by mouth daily.   potassium gluconate 595 (99 K) MG Tabs tablet Take 595 mg by mouth daily.   triamterene-hydrochlorothiazide 37.5-25 MG tablet Commonly known as: MAXZIDE-25 Take 0.5 tablets by mouth every morning.   trimethoprim-polymyxin b ophthalmic solution Commonly known as: POLYTRIM Place 1 drop into the right eye every 4 (four) hours.   vitamin B-12 1000 MCG tablet Commonly known as: CYANOCOBALAMIN Take 2 tablets (2,000 mcg total) by mouth daily.   Vitamin D (Ergocalciferol) 1.25 MG (50000 UNIT) Caps capsule Commonly known as: DRISDOL Take 50,000 Units by mouth every Monday.   zinc gluconate 50 MG tablet Take 50 mg by mouth daily.        Follow-up Information    Vyas, Dhruv B, MD. Schedule an appointment as soon as possible for a visit in 1 week(s).   Specialty: Internal Medicine Why: with repeat cbc/cmp Contact information: [place] [address] [phone]        Phillips Odor, MD. Schedule an appointment as soon as possible for a visit in 1 week(s).   Specialty: Neurology Contact information: [address] [place] [address] [phone]        Psychiatrist. Schedule an appointment as soon as possible for a visit in 1 week(s).              Allergies   Allergen Reactions  . Celecoxib Itching  . Codeine Itching  . Dicyclomine Diarrhea and Other (See Comments)    Made diarrhea worse  . Lamictal [Lamotrigine] Other (See Comments)    Unknown  . Metoclopramide Hcl Other (See Comments)    Dizziness   . Savella [Milnacipran Hcl] Other (See Comments)    Unknown   . Simvastatin     Increased back and muscle pain  . Tape Other (See Comments)    Skin gets red and skin pulls off, paper tape only  . Trazodone And Nefazodone Other (See Comments)    hallucinations  . Venlafaxine Other (See Comments)    Affected eyes, blurred vision  . Citalopram Palpitations and Other (See Comments)    Heart rate increased  . Sertraline Hcl Other (See Comments)    Stomach upset    Consultations:  TTS   Procedures/Studies: DG Chest Portable 1 View  Result Date: 03/10/2020 CLINICAL DATA:  69 year old female with altered mental status. EXAM: PORTABLE CHEST 1 VIEW COMPARISON:  Chest radiograph dated 02/26/2009. FINDINGS: There is mild diffuse interstitial coarsening and bronchitic changes. No focal consolidation, pleural effusion or pneumothorax. The cardiac silhouette is within limits. No acute osseous pathology. Atherosclerotic calcification of the aorta. Lower cervical ACDF. IMPRESSION: No acute cardiopulmonary process. Electronically Signed   By: Anner Crete M.D.   On: 03/10/2020 20:22   XR  KNEE 3 VIEW RIGHT  Result Date: 02/22/2020 Films of the right knee were obtained in several projections standing.  In addition left knee was obtained in the AP projection and there is a well-seated knee replacement.  The right knee demonstrates tricompartmental advanced degenerative changes predominate in the lateral compartment with there is significant narrowing of the joint space.  There is normal alignment.  There is subchondral sclerosis more lateral than medial compartment with peripheral osteophytes and small subchondral cyst.  To a lesser extent similar  findings are noted medially.  There are considerable degenerative changes at the patellofemoral joint but it does track in the midline with some mild loss of joint spaces both medially and laterally.  There also is some ectopic calcification within the femoral and popliteal arteries.  Films are consistent with advanced or end-stage osteoarthritis in all 3 compartments      Subjective: Patient seen and examined at bedside.  Denies any overnight fever, vomiting or shortness of breath.  Poor historian.  Awake and wants to go home today.  Discharge Exam: Vitals:   03/11/20 0930 03/11/20 1000  BP: (!) 162/53 (!) 158/52  Pulse: 66 65  Resp: 16 15  Temp:    SpO2: 92% 94%    General exam: No acute distress.  Poor historian. Respiratory system: Bilateral decreased breath sounds at bases with some scattered crackles Cardiovascular system: Mild intermittent bradycardia, S1-S2 heard Gastrointestinal system: Abdomen is nondistended, soft and nontender.  Bowel sounds are heard  extremities: No edema or clubbing    The results of significant diagnostics from this hospitalization (including imaging, microbiology, ancillary and laboratory) are listed below for reference.     Microbiology: Recent Results (from the past 240 hour(s))  SARS Coronavirus 2 by RT PCR (hospital order, performed in [facility] hospital lab) Nasopharyngeal Nasopharyngeal Swab     Status: None   Collection Time: 03/10/20  7:51 PM   Specimen: Nasopharyngeal Swab  Result Value Ref Range Status   SARS Coronavirus 2 NEGATIVE NEGATIVE Final    Comment: (NOTE) SARS-CoV-2 target nucleic acids are NOT DETECTED.  The SARS-CoV-2 RNA is generally detectable in upper and lower respiratory specimens during the acute phase of infection. The lowest concentration of SARS-CoV-2 viral copies this assay can detect is 250 copies / mL. A negative result does not preclude SARS-CoV-2 infection and should not be used as the sole basis for  treatment or other patient management decisions.  A negative result may occur with improper specimen collection / handling, submission of specimen other than nasopharyngeal swab, presence of viral mutation(s) within the areas targeted by this assay, and inadequate number of viral copies (<250 copies / mL). A negative result must be combined with clinical observations, patient history, and epidemiological information.  Fact Sheet for Patients:   StrictlyIdeas.no  Fact Sheet for Healthcare Providers: BankingDealers.co.za  This test is not yet approved or  cleared by the [place] FDA and has been authorized for detection and/or diagnosis of SARS-CoV-2 by FDA under an Emergency Use Authorization (EUA).  This EUA will remain in effect (meaning this test can be used) for the duration of the COVID-19 declaration under Section 564(b)(1) of the Act, 21 U.S.C. section 360bbb-3(b)(1), unless the authorization is terminated or revoked sooner.  Performed at [facility], [address] [place], [place] [address]      Labs: BNP (last 3 results) No results for input(s): BNP in the last 8760 hours. Basic Metabolic Panel: Recent Labs  Lab 03/10/20 1949 03/11/20  0602  NA 138 137  K 2.8* 3.6  CL 101 107  CO2 23 22  GLUCOSE 167* 96  BUN 65* 48*  CREATININE 1.66* 1.16*  CALCIUM 9.4 8.3*  MG 2.5*  --   PHOS 4.0  --    Liver Function Tests: Recent Labs  Lab 03/10/20 1949 03/11/20 0602  AST 25 19  ALT 16 14  ALKPHOS 77 58  BILITOT 1.2 1.0  PROT 8.5* 6.2*  ALBUMIN 5.2* 3.7   No results for input(s): LIPASE, AMYLASE in the last 168 hours. No results for input(s): AMMONIA in the last 168 hours. CBC: Recent Labs  Lab 03/10/20 1949 03/11/20 0602  WBC 8.7 6.4  NEUTROABS 6.3  --   HGB 15.4* 13.0  HCT 45.4 37.3  MCV 92.3 92.3  PLT 203 154   Cardiac Enzymes: No results for input(s): CKTOTAL, CKMB, CKMBINDEX, TROPONINI in the  last 168 hours. BNP: Invalid input(s): POCBNP CBG: No results for input(s): GLUCAP in the last 168 hours. D-Dimer No results for input(s): DDIMER in the last 72 hours. Hgb A1c No results for input(s): HGBA1C in the last 72 hours. Lipid Profile No results for input(s): CHOL, HDL, LDLCALC, TRIG, CHOLHDL, LDLDIRECT in the last 72 hours. Thyroid function studies No results for input(s): TSH, T4TOTAL, T3FREE, THYROIDAB in the last 72 hours.  Invalid input(s): FREET3 Anemia work up No results for input(s): VITAMINB12, FOLATE, FERRITIN, TIBC, IRON, RETICCTPCT in the last 72 hours. Urinalysis    Component Value Date/Time   COLORURINE YELLOW 03/10/2020 1915   APPEARANCEUR HAZY (A) 03/10/2020 1915   LABSPEC 1.019 03/10/2020 1915   PHURINE 5.0 03/10/2020 1915   GLUCOSEU NEGATIVE 03/10/2020 1915   HGBUR MODERATE (A) 03/10/2020 1915   BILIRUBINUR NEGATIVE 03/10/2020 1915   KETONESUR 5 (A) 03/10/2020 1915   PROTEINUR 100 (A) 03/10/2020 1915   UROBILINOGEN 0.2 02/06/2014 1816   NITRITE NEGATIVE 03/10/2020 1915   LEUKOCYTESUR SMALL (A) 03/10/2020 1915   Sepsis Labs Invalid input(s): PROCALCITONIN,  WBC,  LACTICIDVEN Microbiology Recent Results (from the past 240 hour(s))  SARS Coronavirus 2 by RT PCR (hospital order, performed in [facility] hospital lab) Nasopharyngeal Nasopharyngeal Swab     Status: None   Collection Time: 03/10/20  7:51 PM   Specimen: Nasopharyngeal Swab  Result Value Ref Range Status   SARS Coronavirus 2 NEGATIVE NEGATIVE Final    Comment: (NOTE) SARS-CoV-2 target nucleic acids are NOT DETECTED.  The SARS-CoV-2 RNA is generally detectable in upper and lower respiratory specimens during the acute phase of infection. The lowest concentration of SARS-CoV-2 viral copies this assay can detect is 250 copies / mL. A negative result does not preclude SARS-CoV-2 infection and should not be used as the sole basis for treatment or other patient management decisions.  A  negative result may occur with improper specimen collection / handling, submission of specimen other than nasopharyngeal swab, presence of viral mutation(s) within the areas targeted by this assay, and inadequate number of viral copies (<250 copies / mL). A negative result must be combined with clinical observations, patient history, and epidemiological information.  Fact Sheet for Patients:   StrictlyIdeas.no  Fact Sheet for Healthcare Providers: BankingDealers.co.za  This test is not yet approved or  cleared by the [place] FDA and has been authorized for detection and/or diagnosis of SARS-CoV-2 by FDA under an Emergency Use Authorization (EUA).  This EUA will remain in effect (meaning this test can be used) for the duration of the COVID-19 declaration under  Section 564(b)(1) of the Act, 21 U.S.C. section 360bbb-3(b)(1), unless the authorization is terminated or revoked sooner.  Performed at [facility], [address] [place], [place] [address]      Time coordinating discharge: 35 minutes  SIGNED:   Aline August, MD  [facility] 03/11/2020, 10:21 AM

## 2020-03-11 NOTE — ED Notes (Signed)
[facility] stated they could not evaluated the patient at this time because she is not medically cleared and they are only to be used for psych placement. Hospitalist made aware.

## 2020-03-11 NOTE — ED Notes (Signed)
Hospitalist at bedside at this time 
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
## 2020-03-11 NOTE — Progress Notes (Signed)
Patient ID: [name], female   DOB: 11-05-1950, 69 y.o.   MRN: [id number] Patient admitted early this morning for altered mental status with hallucinations/paranoia.  Patient refused CT of the brain on admission.  I have seen and examined the patient at bedside and plan of care reviewed with her.  Patient is currently alert awake oriented but still slow to respond to questions but wants to go home today.  I have also reviewed patient's medical records including this morning's H&P, current vitals, labs, medications myself.  I have spoken to patient's daughter/Tammy on phone who is concerned that her mom is still not acting herself.  We decided to continue inpatient monitoring along with IV fluids.  I will also request telepsychiatry evaluation because of hallucinations/paranoia.  Patient is on a lot of medications at home including Subutex/scheduled Valium/Lyrica/Phenergan that could attribute to hallucinations.  Encouraged the daughter to convince her mother to get CT of the brain.  If that is negative and patient continues to have hallucinations, will get MRI of the brain and also neurology evaluation.
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
## 2020-03-12 DIAGNOSIS — R443 Hallucinations, unspecified: Secondary | ICD-10-CM

## 2020-03-12 DIAGNOSIS — F22 Delusional disorders: Principal | ICD-10-CM

## 2020-03-12 DIAGNOSIS — I10 Essential (primary) hypertension: Secondary | ICD-10-CM

## 2020-03-12 DIAGNOSIS — E876 Hypokalemia: Secondary | ICD-10-CM

## 2020-03-12 LAB — CBC WITH DIFFERENTIAL/PLATELET
Abs Immature Granulocytes: 0.04 10*3/uL (ref 0.00–0.07)
Basophils Absolute: 0 10*3/uL (ref 0.0–0.1)
Basophils Relative: 0 %
Eosinophils Absolute: 0.1 10*3/uL (ref 0.0–0.5)
Eosinophils Relative: 2 %
HCT: 37.7 % (ref 36.0–46.0)
Hemoglobin: 12.6 g/dL (ref 12.0–15.0)
Immature Granulocytes: 1 %
Lymphocytes Relative: 24 %
Lymphs Abs: 1.5 10*3/uL (ref 0.7–4.0)
MCH: 31.8 pg (ref 26.0–34.0)
MCHC: 33.4 g/dL (ref 30.0–36.0)
MCV: 95.2 fL (ref 80.0–100.0)
Monocytes Absolute: 0.5 10*3/uL (ref 0.1–1.0)
Monocytes Relative: 8 %
Neutro Abs: 3.9 10*3/uL (ref 1.7–7.7)
Neutrophils Relative %: 65 %
Platelets: 150 10*3/uL (ref 150–400)
RBC: 3.96 MIL/uL (ref 3.87–5.11)
RDW: 12.8 % (ref 11.5–15.5)
WBC: 6.1 10*3/uL (ref 4.0–10.5)
nRBC: 0 % (ref 0.0–0.2)

## 2020-03-12 LAB — COMPREHENSIVE METABOLIC PANEL
ALT: 12 U/L (ref 0–44)
AST: 16 U/L (ref 15–41)
Albumin: 3.4 g/dL — ABNORMAL LOW (ref 3.5–5.0)
Alkaline Phosphatase: 52 U/L (ref 38–126)
Anion gap: 7 (ref 5–15)
BUN: 24 mg/dL — ABNORMAL HIGH (ref 8–23)
CO2: 21 mmol/L — ABNORMAL LOW (ref 22–32)
Calcium: 7.8 mg/dL — ABNORMAL LOW (ref 8.9–10.3)
Chloride: 111 mmol/L (ref 98–111)
Creatinine, Ser: 0.96 mg/dL (ref 0.44–1.00)
GFR calc Af Amer: >60 mL/min (ref >60)
GFR calc non Af Amer: 60 mL/min — ABNORMAL LOW (ref >60)
Glucose, Bld: 119 mg/dL — ABNORMAL HIGH (ref 70–99)
Potassium: 3.5 mmol/L (ref 3.5–5.1)
Sodium: 139 mmol/L (ref 135–145)
Total Bilirubin: 0.5 mg/dL (ref 0.3–1.2)
Total Protein: 5.6 g/dL — ABNORMAL LOW (ref 6.5–8.1)

## 2020-03-12 LAB — RAPID URINE DRUG SCREEN, HOSP PERFORMED
Amphetamines: NOT DETECTED
Barbiturates: NOT DETECTED
Benzodiazepines: POSITIVE — AB
Cocaine: NOT DETECTED
Opiates: NOT DETECTED
Tetrahydrocannabinol: NOT DETECTED

## 2020-03-12 LAB — ETHANOL: Alcohol, Ethyl (B): <10 mg/dL (ref <10)

## 2020-03-12 LAB — VITAMIN B12: Vitamin B-12: 105 pg/mL — ABNORMAL LOW (ref 180–914)

## 2020-03-12 LAB — MAGNESIUM: Magnesium: 2.3 mg/dL (ref 1.7–2.4)

## 2020-03-12 LAB — URINE CULTURE
Culture: NO GROWTH
Special Requests: NORMAL

## 2020-03-12 LAB — FOLATE: Folate: 15.1 ng/mL (ref >5.9)

## 2020-03-12 LAB — AMMONIA: Ammonia: 29 umol/L (ref 9–35)

## 2020-03-12 LAB — TSH: TSH: 1.791 u[IU]/mL (ref 0.350–4.500)

## 2020-03-12 MED ORDER — KETOROLAC TROMETHAMINE 15 MG/ML IJ SOLN
15.0000 mg | Freq: Once | INTRAMUSCULAR | Status: AC
  Administered 2020-03-12: 15 mg via INTRAVENOUS
  Filled 2020-03-12: qty 1

## 2020-03-12 MED ORDER — ENOXAPARIN SODIUM 40 MG/0.4ML ~~LOC~~ SOLN
40.0000 mg | SUBCUTANEOUS | Status: DC
  Administered 2020-03-12 – 2020-03-13 (×2): 40 mg via SUBCUTANEOUS
  Filled 2020-03-12: qty 0.4

## 2020-03-12 MED ORDER — CLOPIDOGREL BISULFATE 75 MG PO TABS
75.0000 mg | ORAL_TABLET | Freq: Every day | ORAL | Status: DC
  Administered 2020-03-12 – 2020-03-13 (×2): 75 mg via ORAL
  Filled 2020-03-12 (×2): qty 1

## 2020-03-12 MED ORDER — CYANOCOBALAMIN 1000 MCG/ML IJ SOLN
1000.0000 ug | Freq: Every day | INTRAMUSCULAR | Status: DC
  Administered 2020-03-12 – 2020-03-13 (×2): 1000 ug via INTRAMUSCULAR
  Filled 2020-03-12 (×2): qty 1
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
## 2020-03-12 NOTE — Progress Notes (Signed)
Pt meets inpatient criteria per Letitia Libra, NP. Referral information has been sent to the following hospitals for review:  [facility]  [facility]  [facility]  [facility]        Disposition will continue to assist with inpatient placement needs.   Audree Camel, LCSW, [place] Social Worker II Disposition CSW [phone]

## 2020-03-12 NOTE — BHH Counselor (Signed)
Disposition:  Mordecai Maes, NP, continue to recommend inpatient services
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
## 2020-03-12 NOTE — Plan of Care (Addendum)
  Problem: Acute Rehab PT Goals(only PT should resolve) Goal: Pt Will Go Supine/Side To Sit Outcome: Progressing Flowsheets (Taken 03/12/2020 1102) Pt will go Supine/Side to Sit: with modified independence Goal: Patient Will Transfer Sit To/From Stand Outcome: Progressing Flowsheets (Taken 03/12/2020 1102) Patient will transfer sit to/from stand: with modified independence Goal: Pt Will Transfer Bed To Chair/Chair To Bed Outcome: Progressing Flowsheets (Taken 03/12/2020 1102) Pt will Transfer Bed to Chair/Chair to Bed: with modified independence Goal: Pt Will Ambulate Outcome: Progressing Flowsheets (Taken 03/12/2020 1102) Pt will Ambulate: . > 125 feet . with modified independence . with least restrictive assistive device   11:03 AM , 03/12/20 Karlyn Agee, SPT Physical Therapy with [place]  [facility] [phone] office   During this treatment session, the therapist was present, participating in and directing the treatment.  12:36 PM, 03/12/20 Lonell Grandchild, MPT Physical Therapist with [facility] [phone] office [phone] mobile phone

## 2020-03-12 NOTE — Progress Notes (Addendum)
Patient ID: [name], female   DOB: [birth year], 69 y.o.   MRN: [id number]  PROGRESS NOTE    [name]  ZWC:[id number] DOB: 12/31/1950 DOA: 03/10/2020 PCP: Glenda Chroman, MD   Brief Narrative:  69 y.o. female with medical history significant of anxiety, depression, osteoarthritis, rheumatoid arthritis, carotid artery disease, coronary atherosclerosis, history of NSTEMI, left renal artery stenosis and stent placement, hypertension, hyperlipidemia, PAD, fibromyalgia, GERD, hiatal hernia, restless leg syndrome, spondylosis without myelopathy who lives alone presented with paranoia and hallucinations.  On presentation, UA showed 6-10 WBCs; creatinine was 1.66.  Covid testing was negative.  Chest x-ray showed no acute abnormality.  She was started on IV fluids.  She initially refused CT head testing.   Assessment & Plan:   Acute paranoia/hallucinations History of depression/anxiety/fibromyalgia History of chronic pain -Questionable cause.  Patient presented with hallucinations/paranoia. -TTS has been consulted who is recommending inpatient psychiatric hospitalization.  Patient is medically stable for discharge to the same. -Patient is on a number of medications that can cause altered mental status including Subutex/scheduled Valium/Lyrica/Phenergan: These have been held and need to be addressed by psychiatry/PCP/neurology -CT of the brain was negative for acute intracranial abnormality. -No evidence of any infection: Chest x-ray negative for acute abnormality.  UA only showed 6-10 WBCs.  Patient is afebrile and has no symptoms of UTI. -Monitor mental status.  Fall precautions. -Folate 15.1.  Ammonia 29.  TSH 1.791  Vitamin B12 deficiency -Vitamin B12 level 105.  Will treat parenterally while in the hospital and orally upon discharge  Acute kidney injury -Treated with IV fluids.  Improved.  Encourage oral intake.  DC IV fluids  Hypokalemia -Improved  Hyperlipidemia -Has allergies  to statin  Essential hypertension -Monitor blood pressure.  Continue metoprolol  CAD -Continue metoprolol.  Resume Plavix  DVT prophylaxis: Lovenox Code Status: Full Family Communication: Spoke to daughter Lynelle Smoke on phone on 03/11/2020 Disposition Plan: Status is: Inpatient  Remains inpatient appropriate because:Inpatient level of care appropriate due to severity of illness   Dispo: The patient is from: Home              Anticipated d/c is to: Inpatient psychiatric hospital              Anticipated d/c date is: 1 day              Patient currently is medically stable to d/c.   Consultants: TTS  Procedures: None  Antimicrobials: None   Subjective: Patient seen and examined at bedside.  Awake, states that she wants to go home.  No overnight fever or vomiting reported.  Objective: Vitals:   03/12/20 0300 03/12/20 0450 03/12/20 0651 03/12/20 0900  BP: (!) 191/57 (!) 182/51 (!) 154/41 (!) 154/46  Pulse:  66 71 66  Resp:   18 20  Temp:   98.1 F (36.7 C) 97.8 F (36.6 C)  TempSrc:   Oral Oral  SpO2:   98% 97%  Weight:      Height:        Intake/Output Summary (Last 24 hours) at 03/12/2020 0935 Last data filed at 03/11/2020 2217 Gross per 24 hour  Intake 1186.91 ml  Output 1000 ml  Net 186.91 ml   Filed Weights   03/10/20 1905  Weight: 62.1 kg    Examination:  General exam: Appears calm and comfortable.  Poor historian. Respiratory system: Bilateral decreased breath sounds at bases Cardiovascular system: S1 & S2 heard, Rate controlled Gastrointestinal system: Abdomen is nondistended,  soft and nontender. Normal bowel sounds heard. Extremities: No cyanosis, clubbing, edema  Central nervous system: Awake, poor historian.  No focal neurological deficits. Moving extremities Skin: No rashes, lesions or ulcers Psychiatry: Flat affect    Data Reviewed: I have personally reviewed following labs and imaging studies  CBC: Recent Labs  Lab 03/10/20 1949  03/11/20 0602 03/12/20 0641  WBC 8.7 6.4 6.1  NEUTROABS 6.3  --  3.9  HGB 15.4* 13.0 12.6  HCT 45.4 37.3 37.7  MCV 92.3 92.3 95.2  PLT 203 154 242   Basic Metabolic Panel: Recent Labs  Lab 03/10/20 1949 03/11/20 0602 03/12/20 0641  NA 138 137 139  K 2.8* 3.6 3.5  CL 101 107 111  CO2 23 22 21*  GLUCOSE 167* 96 119*  BUN 65* 48* 24*  CREATININE 1.66* 1.16* 0.96  CALCIUM 9.4 8.3* 7.8*  MG 2.5*  --  2.3  PHOS 4.0  --   --    GFR: Estimated Creatinine Clearance: 43.1 mL/min (by C-G formula based on SCr of 0.96 mg/dL). Liver Function Tests: Recent Labs  Lab 03/10/20 1949 03/11/20 0602 03/12/20 0641  AST 25 19 16   ALT 16 14 12   ALKPHOS 77 58 52  BILITOT 1.2 1.0 0.5  PROT 8.5* 6.2* 5.6*  ALBUMIN 5.2* 3.7 3.4*   No results for input(s): LIPASE, AMYLASE in the last 168 hours. Recent Labs  Lab 03/12/20 0641  AMMONIA 29   Coagulation Profile: No results for input(s): INR, PROTIME in the last 168 hours. Cardiac Enzymes: No results for input(s): CKTOTAL, CKMB, CKMBINDEX, TROPONINI in the last 168 hours. BNP (last 3 results) No results for input(s): PROBNP in the last 8760 hours. HbA1C: No results for input(s): HGBA1C in the last 72 hours. CBG: No results for input(s): GLUCAP in the last 168 hours. Lipid Profile: No results for input(s): CHOL, HDL, LDLCALC, TRIG, CHOLHDL, LDLDIRECT in the last 72 hours. Thyroid Function Tests: Recent Labs    03/12/20 0641  TSH 1.791   Anemia Panel: Recent Labs    03/12/20 0641  VITAMINB12 105*  FOLATE 15.1   Sepsis Labs: No results for input(s): PROCALCITON, LATICACIDVEN in the last 168 hours.  Recent Results (from the past 240 hour(s))  Urine culture     Status: None   Collection Time: 03/10/20  7:15 PM   Specimen: Urine, Clean Catch  Result Value Ref Range Status   Specimen Description   Final    URINE, CLEAN CATCH Performed at [facility], [address] [place], [place] [address]    Special Requests   Final     Normal Performed at [facility], [address], [place], [place] [address]    Culture   Final    NO GROWTH Performed at [facility], [place] [address] [place], [place] [address]    Report Status 03/12/2020 FINAL  Final  SARS Coronavirus 2 by RT PCR (hospital order, performed in [facility] hospital lab) Nasopharyngeal Nasopharyngeal Swab     Status: None   Collection Time: 03/10/20  7:51 PM   Specimen: Nasopharyngeal Swab  Result Value Ref Range Status   SARS Coronavirus 2 NEGATIVE NEGATIVE Final    Comment: (NOTE) SARS-CoV-2 target nucleic acids are NOT DETECTED.  The SARS-CoV-2 RNA is generally detectable in upper and lower respiratory specimens during the acute phase of infection. The lowest concentration of SARS-CoV-2 viral copies this assay can detect is 250 copies / mL. A negative result does not preclude SARS-CoV-2 infection  and should not be used as the sole basis for treatment or other patient management decisions.  A negative result may occur with improper specimen collection / handling, submission of specimen other than nasopharyngeal swab, presence of viral mutation(s) within the areas targeted by this assay, and inadequate number of viral copies (<250 copies / mL). A negative result must be combined with clinical observations, patient history, and epidemiological information.  Fact Sheet for Patients:   StrictlyIdeas.no  Fact Sheet for Healthcare Providers: BankingDealers.co.za  This test is not yet approved or  cleared by the [place] FDA and has been authorized for detection and/or diagnosis of SARS-CoV-2 by FDA under an Emergency Use Authorization (EUA).  This EUA will remain in effect (meaning this test can be used) for the duration of the COVID-19 declaration under Section 564(b)(1) of the Act, 21 U.S.C. section 360bbb-3(b)(1), unless the authorization is terminated or revoked sooner.  Performed  at [facility], [address], [place],  [address]          Radiology Studies: CT HEAD WO CONTRAST  Result Date: 03/11/2020 CLINICAL DATA:  Mental status change. EXAM: CT HEAD WITHOUT CONTRAST TECHNIQUE: Contiguous axial images were obtained from the base of the skull through the vertex without intravenous contrast. COMPARISON:  February 06, 2014 FINDINGS: Brain: No subdural, epidural, or subarachnoid hemorrhage. Cerebellum, brainstem, and basal cisterns are normal. Ventricles and sulci are stable. White matter changes are mildly increased since 2015. No acute cortical ischemia or infarct identified. No mass effect or midline shift. Vascular: No hyperdense vessel or unexpected calcification. Skull: Normal. Negative for fracture or focal lesion. Sinuses/Orbits: No acute finding. Other: None. IMPRESSION: No acute intracranial abnormalities. Chronic mildly progressive white matter changes compared to 2015. Electronically Signed   By: Dorise Bullion III M.D   On: 03/11/2020 11:40   DG Chest Portable 1 View  Result Date: 03/10/2020 CLINICAL DATA:  69 year old female with altered mental status. EXAM: PORTABLE CHEST 1 VIEW COMPARISON:  Chest radiograph dated 02/26/2009. FINDINGS: There is mild diffuse interstitial coarsening and bronchitic changes. No focal consolidation, pleural effusion or pneumothorax. The cardiac silhouette is within limits. No acute osseous pathology. Atherosclerotic calcification of the aorta. Lower cervical ACDF. IMPRESSION: No acute cardiopulmonary process. Electronically Signed   By: Anner Crete M.D.   On: 03/10/2020 20:22        Scheduled Meds: . enoxaparin (LOVENOX) injection  40 mg Subcutaneous Q24H  . metoprolol succinate  25 mg Oral Daily   Continuous Infusions: . 0.45 % NaCl with KCl 20 mEq / L 100 mL/hr at 03/11/20 2217          Aline August, MD [facility] 03/12/2020, 9:35 AM

## 2020-03-13 DIAGNOSIS — D519 Vitamin B12 deficiency anemia, unspecified: Secondary | ICD-10-CM

## 2020-03-13 MED ORDER — VITAMIN B-12 1000 MCG PO TABS: 2000.0000 ug | ORAL_TABLET | Freq: Every day | ORAL | 0 refills | Status: DC

## 2020-03-13 MED ORDER — PANTOPRAZOLE SODIUM 40 MG PO TBEC
40.0000 mg | DELAYED_RELEASE_TABLET | Freq: Every day | ORAL | Status: DC
  Administered 2020-03-13: 40 mg via ORAL
  Filled 2020-03-13: qty 1

## 2020-03-13 MED ORDER — HYDRALAZINE HCL 25 MG PO TABS
25.0000 mg | ORAL_TABLET | Freq: Once | ORAL | Status: DC
  Filled 2020-03-13: qty 1

## 2020-03-13 MED ORDER — ALUM & MAG HYDROXIDE-SIMETH 200-200-20 MG/5ML PO SUSP
15.0000 mL | ORAL | Status: DC | PRN
  Filled 2020-03-13: qty 30

## 2020-03-13 NOTE — Progress Notes (Addendum)
Patient ID: [name], female   DOB: 1951/02/21, 69 y.o.   MRN: [id number]  PROGRESS NOTE    KANDACE ELROD  XIP:[id number] DOB: 11-May-1951 DOA: 03/10/2020 PCP: Glenda Chroman, MD   Brief Narrative:  69 y.o. female with medical history significant of anxiety, depression, osteoarthritis, rheumatoid arthritis, carotid artery disease, coronary atherosclerosis, history of NSTEMI, left renal artery stenosis and stent placement, hypertension, hyperlipidemia, PAD, fibromyalgia, GERD, hiatal hernia, restless leg syndrome, spondylosis without myelopathy who lives alone presented with paranoia and hallucinations.  On presentation, UA showed 6-10 WBCs; creatinine was 1.66.  Covid testing was negative.  Chest x-ray showed no acute abnormality.  She was started on IV fluids.  She initially refused CT head testing.  She was subsequently evaluated by TTS who recommended inpatient psychiatric hospitalization.   Assessment & Plan:   Acute paranoia/hallucinations History of depression/anxiety/fibromyalgia History of chronic pain -Questionable cause.  Patient presented with hallucinations/paranoia. -TTS has been consulted who is recommending inpatient psychiatric hospitalization.  Patient is medically stable for discharge to the same. -Patient is on a number of medications that can cause altered mental status including Subutex/scheduled Valium/Lyrica/Phenergan: These have been held and need to be addressed by psychiatry/PCP/neurology -CT of the brain was negative for acute intracranial abnormality. -No evidence of any infection: Chest x-ray negative for acute abnormality.  UA only showed 6-10 WBCs.  Patient is afebrile and has no symptoms of UTI. -Monitor mental status.  Fall precautions. -Folate 15.1.  Ammonia 29.  TSH 1.791  Vitamin B12 deficiency -Vitamin B12 level 105.  Will treat parenterally while in the hospital and orally upon discharge  Acute kidney injury -Treated with IV fluids.  Improved.   Encourage oral intake.  Of IV fluids  Hypokalemia -Improved  Hyperlipidemia -Has allergies to statin  Essential hypertension -Monitor blood pressure.  Continue metoprolol  CAD -Continue metoprolol and Plavix  DVT prophylaxis: Lovenox Code Status: Full Family Communication: Spoke to daughter Lynelle Smoke on phone on 03/13/2020 Disposition Plan: Status is: Inpatient  Remains inpatient appropriate because:Inpatient level of care appropriate due to severity of illness   Dispo: The patient is from: Home              Anticipated d/c is to: Inpatient psychiatric hospital              Anticipated d/c date is: 1 day              Patient currently is medically stable to d/c.   Consultants: TTS  Procedures: None  Antimicrobials: None   Subjective: Patient seen and examined at bedside.  Denies any overnight fever, vomiting or shortness of breath.  Poor historian.  Awake and wants to go home today. Objective: Vitals:   03/12/20 1848 03/12/20 2109 03/13/20 0435 03/13/20 0808  BP: (!) 195/59 (!) 174/64 (!) 175/45 (!) 172/54  Pulse: 66 62 (!) 57 (!) 58  Resp: 18 18 16 16   Temp:  97.7 F (36.5 C) 98.1 F (36.7 C) 98.3 F (36.8 C)  TempSrc:  Oral  Oral  SpO2: 98% 98% 98% 97%  Weight:      Height:        Intake/Output Summary (Last 24 hours) at 03/13/2020 0932 Last data filed at 03/13/2020 0500 Gross per 24 hour  Intake --  Output 1200 ml  Net -1200 ml   Filed Weights   03/10/20 1905  Weight: 62.1 kg    Examination:  General exam: No acute distress.  Poor historian. Respiratory system: Bilateral  decreased breath sounds at bases with some scattered crackles Cardiovascular system: Mild intermittent bradycardia, S1-S2 heard Gastrointestinal system: Abdomen is nondistended, soft and nontender.  Bowel sounds are heard  extremities: No edema or clubbing     Data Reviewed: I have personally reviewed following labs and imaging studies  CBC: Recent Labs  Lab 03/10/20 1949  03/11/20 0602 03/12/20 0641  WBC 8.7 6.4 6.1  NEUTROABS 6.3  --  3.9  HGB 15.4* 13.0 12.6  HCT 45.4 37.3 37.7  MCV 92.3 92.3 95.2  PLT 203 154 301   Basic Metabolic Panel: Recent Labs  Lab 03/10/20 1949 03/11/20 0602 03/12/20 0641  NA 138 137 139  K 2.8* 3.6 3.5  CL 101 107 111  CO2 23 22 21*  GLUCOSE 167* 96 119*  BUN 65* 48* 24*  CREATININE 1.66* 1.16* 0.96  CALCIUM 9.4 8.3* 7.8*  MG 2.5*  --  2.3  PHOS 4.0  --   --    GFR: Estimated Creatinine Clearance: 43.1 mL/min (by C-G formula based on SCr of 0.96 mg/dL). Liver Function Tests: Recent Labs  Lab 03/10/20 1949 03/11/20 0602 03/12/20 0641  AST 25 19 16   ALT 16 14 12   ALKPHOS 77 58 52  BILITOT 1.2 1.0 0.5  PROT 8.5* 6.2* 5.6*  ALBUMIN 5.2* 3.7 3.4*   No results for input(s): LIPASE, AMYLASE in the last 168 hours. Recent Labs  Lab 03/12/20 0641  AMMONIA 29   Coagulation Profile: No results for input(s): INR, PROTIME in the last 168 hours. Cardiac Enzymes: No results for input(s): CKTOTAL, CKMB, CKMBINDEX, TROPONINI in the last 168 hours. BNP (last 3 results) No results for input(s): PROBNP in the last 8760 hours. HbA1C: No results for input(s): HGBA1C in the last 72 hours. CBG: No results for input(s): GLUCAP in the last 168 hours. Lipid Profile: No results for input(s): CHOL, HDL, LDLCALC, TRIG, CHOLHDL, LDLDIRECT in the last 72 hours. Thyroid Function Tests: Recent Labs    03/12/20 0641  TSH 1.791   Anemia Panel: Recent Labs    03/12/20 0641  VITAMINB12 105*  FOLATE 15.1   Sepsis Labs: No results for input(s): PROCALCITON, LATICACIDVEN in the last 168 hours.  Recent Results (from the past 240 hour(s))  Urine culture     Status: None   Collection Time: 03/10/20  7:15 PM   Specimen: Urine, Clean Catch  Result Value Ref Range Status   Specimen Description   Final    URINE, CLEAN CATCH Performed at [facility], [address], [place], [place] [address]    Special Requests   Final      Normal Performed at [facility], [address], [place], [place] [address]    Culture   Final    NO GROWTH Performed at [facility], [place] [address], [place], [place] [address]    Report Status 03/12/2020 FINAL  Final  SARS Coronavirus 2 by RT PCR (hospital order, performed in [facility] hospital lab) Nasopharyngeal Nasopharyngeal Swab     Status: None   Collection Time: 03/10/20  7:51 PM   Specimen: Nasopharyngeal Swab  Result Value Ref Range Status   SARS Coronavirus 2 NEGATIVE NEGATIVE Final    Comment: (NOTE) SARS-CoV-2 target nucleic acids are NOT DETECTED.  The SARS-CoV-2 RNA is generally detectable in upper and lower respiratory specimens during the acute phase of infection. The lowest concentration of SARS-CoV-2 viral copies this assay can detect is 250 copies / mL. A negative result does not preclude  SARS-CoV-2 infection and should not be used as the sole basis for treatment or other patient management decisions.  A negative result may occur with improper specimen collection / handling, submission of specimen other than nasopharyngeal swab, presence of viral mutation(s) within the areas targeted by this assay, and inadequate number of viral copies (<250 copies / mL). A negative result must be combined with clinical observations, patient history, and epidemiological information.  Fact Sheet for Patients:   StrictlyIdeas.no  Fact Sheet for Healthcare Providers: BankingDealers.co.za  This test is not yet approved or  cleared by the [place] FDA and has been authorized for detection and/or diagnosis of SARS-CoV-2 by FDA under an Emergency Use Authorization (EUA).  This EUA will remain in effect (meaning this test can be used) for the duration of the COVID-19 declaration under Section 564(b)(1) of the Act, 21 U.S.C. section 360bbb-3(b)(1), unless the authorization is terminated or revoked sooner.  Performed  at [facility], [address] [place], [place] [address]          Radiology Studies: CT HEAD WO CONTRAST  Result Date: 03/11/2020 CLINICAL DATA:  Mental status change. EXAM: CT HEAD WITHOUT CONTRAST TECHNIQUE: Contiguous axial images were obtained from the base of the skull through the vertex without intravenous contrast. COMPARISON:  February 06, 2014 FINDINGS: Brain: No subdural, epidural, or subarachnoid hemorrhage. Cerebellum, brainstem, and basal cisterns are normal. Ventricles and sulci are stable. White matter changes are mildly increased since 2015. No acute cortical ischemia or infarct identified. No mass effect or midline shift. Vascular: No hyperdense vessel or unexpected calcification. Skull: Normal. Negative for fracture or focal lesion. Sinuses/Orbits: No acute finding. Other: None. IMPRESSION: No acute intracranial abnormalities. Chronic mildly progressive white matter changes compared to 2015. Electronically Signed   By: Dorise Bullion III M.D   On: 03/11/2020 11:40        Scheduled Meds: . clopidogrel  75 mg Oral QPC breakfast  . cyanocobalamin  1,000 mcg Intramuscular Daily  . enoxaparin (LOVENOX) injection  40 mg Subcutaneous Q24H  . metoprolol succinate  25 mg Oral Daily  . pantoprazole  40 mg Oral Daily   Continuous Infusions:         Aline August, MD [facility] 03/13/2020, 9:32 AM

## 2020-03-13 NOTE — Discharge Summary (Signed)
Physician Discharge Summary  [name] SVX:[id number] DOB: 22-Oct-1950 DOA: 03/10/2020  PCP: Glenda Chroman, MD  Admit date: 03/10/2020 Discharge date: 03/13/2020  Admitted From: Home Disposition:Inpatient psychiatric hospital  Recommendations for Outpatient Follow-up:  1. Follow up with provider at inpatient psychiatric hospital  2. Follow up in ED if symptoms worsen or new appear   Home Health: No Equipment/Devices: None  Discharge Condition: Stable CODE STATUS: Full Diet recommendation: Heart healthy  Brief/Interim Summary: 69 y.o.femalewith medical history significant ofanxiety, depression, osteoarthritis, rheumatoid arthritis, carotid artery disease, coronary atherosclerosis, history of NSTEMI, left renal artery stenosis and stent placement, hypertension, hyperlipidemia, PAD, fibromyalgia, GERD, hiatal hernia, restless leg syndrome, spondylosis without myelopathy who lives alone presented with paranoia and hallucinations. On presentation, UA showed 6-10 WBCs; creatinine was 1.66. Covid testing was negative. Chest x-ray showed no acute abnormality. She was started on IV fluids. She initially refused CT head testing.She was subsequently evaluated by TTS who recommended inpatient psychiatric hospitalization.  She will be discharged to inpatient psychiatric hospital once bed is available  Discharge Diagnoses:   Acute paranoia/hallucinations History of depression/anxiety/fibromyalgia History of chronic pain/opioid dependence -Questionable cause. Patient presented with hallucinations/paranoia. -TTS consulted who is recommending inpatient psychiatric hospitalization. Patient is medically stable for discharge to the same. -Patient is on a number of medications that can cause altered mental status including Subutex/scheduled Valium/Lyrica/Phenergan: These have been held and need to be addressed by psychiatry/PCP/neurology -CT of the brain was negative for acute intracranial  abnormality. -No evidence of any infection: Chest x-ray negative for acute abnormality. UA only showed 6-10 WBCs. Patient is afebrile and has no symptoms of UTI. -Monitor mental status. Fall precautions. -Folate 15.1. Ammonia 29. TSH 1.791 - She will be discharged to inpatient psychiatric hospital once bed is available  Vitamin B12 deficiency -Vitamin B12 level 105.  Currently being treated parenterally while in the hospital and will need oral supplementation on discharge.  Acute kidney injury -Treated with IV fluids. Improved. Encourage oral intake. OfIV fluids  Hypokalemia -Improved  Hyperlipidemia -Has allergies to statin  Essential hypertension -Monitor blood pressure.  Resume triamterene hydrochlorothiazide on discharge as renal function has improved.  CAD -Continue Plavix.  Outpatient follow-up with cardiology.  Discharge Instructions  Discharge Instructions    Diet - low sodium heart healthy   Complete by: As directed    Increase activity slowly   Complete by: As directed      Allergies as of 03/13/2020      Reactions   Celecoxib Itching   Codeine Itching   Dicyclomine Diarrhea, Other (See Comments)   Made diarrhea worse   Lamictal [lamotrigine] Other (See Comments)   Unknown   Metoclopramide Hcl Other (See Comments)   Dizziness    Savella [milnacipran Hcl] Other (See Comments)   Unknown    Simvastatin    Increased back and muscle pain   Tape Other (See Comments)   Skin gets red and skin pulls off, paper tape only   Trazodone And Nefazodone Other (See Comments)   hallucinations   Venlafaxine Other (See Comments)   Affected eyes, blurred vision   Citalopram Palpitations, Other (See Comments)   Heart rate increased   Sertraline Hcl Other (See Comments)   Stomach upset      Medication List    STOP taking these medications   buprenorphine 8 MG Subl SL tablet Commonly known as: SUBUTEX   DayVigo 5 MG Tabs Generic drug: Lemborexant    diazepam 5 MG tablet Commonly known as: VALIUM  pregabalin 50 MG capsule Commonly known as: LYRICA   promethazine 25 MG tablet Commonly known as: PHENERGAN   simvastatin 40 MG tablet Commonly known as: ZOCOR     TAKE these medications   ALIVE WOMENS GUMMY PO Take 1 Dose by mouth daily.   clopidogrel 75 MG tablet Commonly known as: PLAVIX Take 75 mg daily after breakfast by mouth.   fluticasone 50 MCG/ACT nasal spray Commonly known as: FLONASE Place 2 sprays into both nostrils daily as needed for allergies.   levothyroxine 25 MCG tablet Commonly known as: SYNTHROID Take 25 mcg by mouth daily.   nitroGLYCERIN 0.4 MG SL tablet Commonly known as: NITROSTAT Place 0.4 mg under the tongue every 5 (five) minutes as needed for chest pain.   omeprazole 20 MG capsule Commonly known as: PRILOSEC Take 20 mg by mouth daily.   potassium gluconate 595 (99 K) MG Tabs tablet Take 595 mg by mouth daily.   triamterene-hydrochlorothiazide 37.5-25 MG tablet Commonly known as: MAXZIDE-25 Take 0.5 tablets by mouth every morning.   trimethoprim-polymyxin b ophthalmic solution Commonly known as: POLYTRIM Place 1 drop into the right eye every 4 (four) hours.   vitamin B-12 1000 MCG tablet Commonly known as: CYANOCOBALAMIN Take 2 tablets (2,000 mcg total) by mouth daily.   Vitamin D (Ergocalciferol) 1.25 MG (50000 UNIT) Caps capsule Commonly known as: DRISDOL Take 50,000 Units by mouth every Monday.   zinc gluconate 50 MG tablet Take 50 mg by mouth daily.       Follow-up Information    Vyas, Dhruv B, MD. Schedule an appointment as soon as possible for a visit in 1 week(s).   Specialty: Internal Medicine Why: with repeat cbc/cmp Contact information: [place] [address] [phone]        Phillips Odor, MD. Schedule an appointment as soon as possible for a visit in 1 week(s).   Specialty: Neurology Contact information: [address] [place]  [address] [phone]        Psychiatrist. Schedule an appointment as soon as possible for a visit in 1 week(s).              Allergies  Allergen Reactions  . Celecoxib Itching  . Codeine Itching  . Dicyclomine Diarrhea and Other (See Comments)    Made diarrhea worse  . Lamictal [Lamotrigine] Other (See Comments)    Unknown  . Metoclopramide Hcl Other (See Comments)    Dizziness   . Savella [Milnacipran Hcl] Other (See Comments)    Unknown   . Simvastatin     Increased back and muscle pain  . Tape Other (See Comments)    Skin gets red and skin pulls off, paper tape only  . Trazodone And Nefazodone Other (See Comments)    hallucinations  . Venlafaxine Other (See Comments)    Affected eyes, blurred vision  . Citalopram Palpitations and Other (See Comments)    Heart rate increased  . Sertraline Hcl Other (See Comments)    Stomach upset    Consultations:  TTS   Procedures/Studies: CT HEAD WO CONTRAST  Result Date: 03/11/2020 CLINICAL DATA:  Mental status change. EXAM: CT HEAD WITHOUT CONTRAST TECHNIQUE: Contiguous axial images were obtained from the base of the skull through the vertex without intravenous contrast. COMPARISON:  February 06, 2014 FINDINGS: Brain: No subdural, epidural, or subarachnoid hemorrhage. Cerebellum, brainstem, and basal cisterns are normal. Ventricles and sulci are stable. White matter changes are mildly increased since 2015. No acute cortical ischemia  or infarct identified. No mass effect or midline shift. Vascular: No hyperdense vessel or unexpected calcification. Skull: Normal. Negative for fracture or focal lesion. Sinuses/Orbits: No acute finding. Other: None. IMPRESSION: No acute intracranial abnormalities. Chronic mildly progressive white matter changes compared to 2015. Electronically Signed   By: Dorise Bullion III M.D   On: 03/11/2020 11:40   DG Chest Portable 1 View  Result Date: 03/10/2020 CLINICAL DATA:  69 year old female with altered  mental status. EXAM: PORTABLE CHEST 1 VIEW COMPARISON:  Chest radiograph dated 02/26/2009. FINDINGS: There is mild diffuse interstitial coarsening and bronchitic changes. No focal consolidation, pleural effusion or pneumothorax. The cardiac silhouette is within limits. No acute osseous pathology. Atherosclerotic calcification of the aorta. Lower cervical ACDF. IMPRESSION: No acute cardiopulmonary process. Electronically Signed   By: Anner Crete M.D.   On: 03/10/2020 20:22   XR KNEE 3 VIEW RIGHT  Result Date: 02/22/2020 Films of the right knee were obtained in several projections standing.  In addition left knee was obtained in the AP projection and there is a well-seated knee replacement.  The right knee demonstrates tricompartmental advanced degenerative changes predominate in the lateral compartment with there is significant narrowing of the joint space.  There is normal alignment.  There is subchondral sclerosis more lateral than medial compartment with peripheral osteophytes and small subchondral cyst.  To a lesser extent similar findings are noted medially.  There are considerable degenerative changes at the patellofemoral joint but it does track in the midline with some mild loss of joint spaces both medially and laterally.  There also is some ectopic calcification within the femoral and popliteal arteries.  Films are consistent with advanced or end-stage osteoarthritis in all 3 compartments      Subjective: Patient seen and examined at bedside.Denies any overnight fever, vomiting or shortness of breath. Poor historian. Awake and wants to go home today.  Discharge Exam: Vitals:   03/13/20 0435 03/13/20 0808  BP: (!) 175/45 (!) 172/54  Pulse: (!) 57 (!) 58  Resp: 16 16  Temp: 98.1 F (36.7 C) 98.3 F (36.8 C)  SpO2: 98% 97%   General exam:No acute distress. Poor historian. Respiratory system: Bilateral decreased breath sounds at baseswith some scattered  crackles Cardiovascular system:Mild intermittent bradycardia, S1-S2 heard Gastrointestinal system:Abdomen is nondistended, soft and nontender.Bowel sounds are heard  extremities:No edema or clubbing     The results of significant diagnostics from this hospitalization (including imaging, microbiology, ancillary and laboratory) are listed below for reference.     Microbiology: Recent Results (from the past 240 hour(s))  Urine culture     Status: None   Collection Time: 03/10/20  7:15 PM   Specimen: Urine, Clean Catch  Result Value Ref Range Status   Specimen Description   Final    URINE, CLEAN CATCH Performed at [facility], [address], [place], [place] [address]    Special Requests   Final    Normal Performed at [facility], [address] [place], [place] [address]    Culture   Final    NO GROWTH Performed at [facility], [place] [address] [place], [place] [address]    Report Status 03/12/2020 FINAL  Final  SARS Coronavirus 2 by RT PCR (hospital order, performed in [facility] hospital lab) Nasopharyngeal Nasopharyngeal Swab     Status: None   Collection Time: 03/10/20  7:51 PM   Specimen: Nasopharyngeal Swab  Result Value Ref Range Status   SARS Coronavirus 2 NEGATIVE NEGATIVE Final  Comment: (NOTE) SARS-CoV-2 target nucleic acids are NOT DETECTED.  The SARS-CoV-2 RNA is generally detectable in upper and lower respiratory specimens during the acute phase of infection. The lowest concentration of SARS-CoV-2 viral copies this assay can detect is 250 copies / mL. A negative result does not preclude SARS-CoV-2 infection and should not be used as the sole basis for treatment or other patient management decisions.  A negative result may occur with improper specimen collection / handling, submission of specimen other than nasopharyngeal swab, presence of viral mutation(s) within the areas targeted by this assay, and inadequate number of viral copies (<250  copies / mL). A negative result must be combined with clinical observations, patient history, and epidemiological information.  Fact Sheet for Patients:   StrictlyIdeas.no  Fact Sheet for Healthcare Providers: BankingDealers.co.za  This test is not yet approved or  cleared by the [place] FDA and has been authorized for detection and/or diagnosis of SARS-CoV-2 by FDA under an Emergency Use Authorization (EUA).  This EUA will remain in effect (meaning this test can be used) for the duration of the COVID-19 declaration under Section 564(b)(1) of the Act, 21 U.S.C. section 360bbb-3(b)(1), unless the authorization is terminated or revoked sooner.  Performed at [facility], [address], [place], [place] [address]      Labs: BNP (last 3 results) No results for input(s): BNP in the last 8760 hours. Basic Metabolic Panel: Recent Labs  Lab 03/10/20 1949 03/11/20 0602 03/12/20 0641  NA 138 137 139  K 2.8* 3.6 3.5  CL 101 107 111  CO2 23 22 21*  GLUCOSE 167* 96 119*  BUN 65* 48* 24*  CREATININE 1.66* 1.16* 0.96  CALCIUM 9.4 8.3* 7.8*  MG 2.5*  --  2.3  PHOS 4.0  --   --    Liver Function Tests: Recent Labs  Lab 03/10/20 1949 03/11/20 0602 03/12/20 0641  AST 25 19 16   ALT 16 14 12   ALKPHOS 77 58 52  BILITOT 1.2 1.0 0.5  PROT 8.5* 6.2* 5.6*  ALBUMIN 5.2* 3.7 3.4*   No results for input(s): LIPASE, AMYLASE in the last 168 hours. Recent Labs  Lab 03/12/20 0641  AMMONIA 29   CBC: Recent Labs  Lab 03/10/20 1949 03/11/20 0602 03/12/20 0641  WBC 8.7 6.4 6.1  NEUTROABS 6.3  --  3.9  HGB 15.4* 13.0 12.6  HCT 45.4 37.3 37.7  MCV 92.3 92.3 95.2  PLT 203 154 150   Cardiac Enzymes: No results for input(s): CKTOTAL, CKMB, CKMBINDEX, TROPONINI in the last 168 hours. BNP: Invalid input(s): POCBNP CBG: No results for input(s): GLUCAP in the last 168 hours. D-Dimer No results for input(s): DDIMER in the last 72  hours. Hgb A1c No results for input(s): HGBA1C in the last 72 hours. Lipid Profile No results for input(s): CHOL, HDL, LDLCALC, TRIG, CHOLHDL, LDLDIRECT in the last 72 hours. Thyroid function studies Recent Labs    03/12/20 0641  TSH 1.791   Anemia work up Recent Labs    03/12/20 0641  VITAMINB12 105*  FOLATE 15.1   Urinalysis    Component Value Date/Time   COLORURINE YELLOW 03/10/2020 1915   APPEARANCEUR HAZY (A) 03/10/2020 1915   LABSPEC 1.019 03/10/2020 1915   PHURINE 5.0 03/10/2020 1915   GLUCOSEU NEGATIVE 03/10/2020 1915   HGBUR MODERATE (A) 03/10/2020 1915   BILIRUBINUR NEGATIVE 03/10/2020 1915   KETONESUR 5 (A) 03/10/2020 1915   PROTEINUR 100 (A) 03/10/2020 1915   UROBILINOGEN 0.2 02/06/2014 1816  NITRITE NEGATIVE 03/10/2020 1915   LEUKOCYTESUR SMALL (A) 03/10/2020 1915   Sepsis Labs Invalid input(s): PROCALCITONIN,  WBC,  LACTICIDVEN Microbiology Recent Results (from the past 240 hour(s))  Urine culture     Status: None   Collection Time: 03/10/20  7:15 PM   Specimen: Urine, Clean Catch  Result Value Ref Range Status   Specimen Description   Final    URINE, CLEAN CATCH Performed at [facility], [address], [place], [place] [address]    Special Requests   Final    Normal Performed at [facility], [address], [place], [place] [address]    Culture   Final    NO GROWTH Performed at [facility], [place] [address], [place], [place] [address]    Report Status 03/12/2020 FINAL  Final  SARS Coronavirus 2 by RT PCR (hospital order, performed in [facility] hospital lab) Nasopharyngeal Nasopharyngeal Swab     Status: None   Collection Time: 03/10/20  7:51 PM   Specimen: Nasopharyngeal Swab  Result Value Ref Range Status   SARS Coronavirus 2 NEGATIVE NEGATIVE Final    Comment: (NOTE) SARS-CoV-2 target nucleic acids are NOT DETECTED.  The SARS-CoV-2 RNA is generally detectable in upper and lower respiratory specimens during the acute phase of  infection. The lowest concentration of SARS-CoV-2 viral copies this assay can detect is 250 copies / mL. A negative result does not preclude SARS-CoV-2 infection and should not be used as the sole basis for treatment or other patient management decisions.  A negative result may occur with improper specimen collection / handling, submission of specimen other than nasopharyngeal swab, presence of viral mutation(s) within the areas targeted by this assay, and inadequate number of viral copies (<250 copies / mL). A negative result must be combined with clinical observations, patient history, and epidemiological information.  Fact Sheet for Patients:   StrictlyIdeas.no  Fact Sheet for Healthcare Providers: BankingDealers.co.za  This test is not yet approved or  cleared by the [place] FDA and has been authorized for detection and/or diagnosis of SARS-CoV-2 by FDA under an Emergency Use Authorization (EUA).  This EUA will remain in effect (meaning this test can be used) for the duration of the COVID-19 declaration under Section 564(b)(1) of the Act, 21 U.S.C. section 360bbb-3(b)(1), unless the authorization is terminated or revoked sooner.  Performed at [facility], [address], [place], [place] [address]      Time coordinating discharge: 35 minutes  SIGNED:   Aline August, MD  [facility] 03/13/2020, 11:56 AM

## 2020-03-13 NOTE — BH Assessment (Signed)
Received a call from [facility] stating that they are also reviewing patient for admission. Shared with the facility that she is tentatively accepted to another facility. They will review patient for admission if her placement at [facility] doesn't work out.
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
## 2020-03-13 NOTE — BH Assessment (Addendum)
Received a call back from [facility] at [facility]. She states that patient's referral was reviewed further. Due to the fact that she is on Suboxone, patient was declined.   Reached out to [facility] to let them know that patient is still in need of bed placement.   Patient accepted to [facility] in [place], [place] The nurse report # is [phone]. The accepting provider is Brantley Fling, MD. Requesting nursing staff/LCSW to fax IVC papers to [facility] once served to patient. The fax number to [facility] is [phone]. Patient's bed is ready for admission today.

## 2020-03-13 NOTE — BH Assessment (Addendum)
Received a message from [place] with [facility] inquiring if patient still needs placement. Returned call x2 and left message that patient still needs bed placement.

## 2020-03-13 NOTE — TOC Transition Note (Signed)
Transition of Care [facility]) - CM/SW Discharge Note  Patient Details  Name: [name] MRN: [id number] Date of Birth: Jun 09, 1951  Transition of Care [facility]) CM/SW Contact:  Sherie Don, LCSW Phone Number: 03/13/2020, 3:11 PM  Clinical Narrative: Patient has been accepted at [facility] for inpatient psych. Facility requested the patient be under IVC. CSW and hospitalist completed IVC paperwork. CSW faxed paperwork to magistrate's office and confirmed it was received. Awaiting IVC paperwork to be served to patient.  Final next level of care: Psychiatric Hospital Barriers to Discharge: Barriers Resolved  Patient Goals and CMS Choice Patient states their goals for this hospitalization and ongoing recovery are:: Discharge to [facility] CMS Medicare.gov Compare Post Acute Care list provided to:: Patient Represenative (must comment) (Tammy Kerri Perches (daughter)) Choice offered to / list presented to : Adult Children  Discharge Placement Patient to be transferred to facility by: Safe Transport Name of family member notified: [name] (daughter) Patient and family notified of of transfer: 03/13/20  Discharge Plan and Services        DME Arranged: N/A DME Agency: NA HH Arranged: NA [place] Agency: NA  Readmission Risk Interventions No flowsheet data found.

## 2020-03-21 DIAGNOSIS — I2510 Atherosclerotic heart disease of native coronary artery without angina pectoris: Secondary | ICD-10-CM | POA: Diagnosis not present

## 2020-03-21 DIAGNOSIS — Z299 Encounter for prophylactic measures, unspecified: Secondary | ICD-10-CM | POA: Diagnosis not present

## 2020-03-21 DIAGNOSIS — I701 Atherosclerosis of renal artery: Secondary | ICD-10-CM | POA: Diagnosis not present

## 2020-03-21 DIAGNOSIS — I10 Essential (primary) hypertension: Secondary | ICD-10-CM | POA: Diagnosis not present

## 2020-03-21 DIAGNOSIS — Z09 Encounter for follow-up examination after completed treatment for conditions other than malignant neoplasm: Secondary | ICD-10-CM | POA: Diagnosis not present

## 2020-04-02 DIAGNOSIS — Z0000 Encounter for general adult medical examination without abnormal findings: Secondary | ICD-10-CM | POA: Diagnosis not present

## 2020-04-02 DIAGNOSIS — I10 Essential (primary) hypertension: Secondary | ICD-10-CM | POA: Diagnosis not present

## 2020-04-02 DIAGNOSIS — Z7189 Other specified counseling: Secondary | ICD-10-CM | POA: Diagnosis not present

## 2020-04-02 DIAGNOSIS — Z299 Encounter for prophylactic measures, unspecified: Secondary | ICD-10-CM | POA: Diagnosis not present

## 2020-04-03 DIAGNOSIS — R5383 Other fatigue: Secondary | ICD-10-CM | POA: Diagnosis not present

## 2020-04-03 DIAGNOSIS — E538 Deficiency of other specified B group vitamins: Secondary | ICD-10-CM | POA: Diagnosis not present

## 2020-04-03 DIAGNOSIS — Z79899 Other long term (current) drug therapy: Secondary | ICD-10-CM | POA: Diagnosis not present

## 2020-04-03 DIAGNOSIS — E559 Vitamin D deficiency, unspecified: Secondary | ICD-10-CM | POA: Diagnosis not present

## 2020-04-03 DIAGNOSIS — E7800 Pure hypercholesterolemia, unspecified: Secondary | ICD-10-CM | POA: Diagnosis not present

## 2020-04-13 DIAGNOSIS — J301 Allergic rhinitis due to pollen: Secondary | ICD-10-CM | POA: Diagnosis not present

## 2020-04-20 DIAGNOSIS — Z79899 Other long term (current) drug therapy: Secondary | ICD-10-CM | POA: Diagnosis not present

## 2020-04-20 DIAGNOSIS — J309 Allergic rhinitis, unspecified: Secondary | ICD-10-CM | POA: Diagnosis not present

## 2020-04-23 DIAGNOSIS — J309 Allergic rhinitis, unspecified: Secondary | ICD-10-CM | POA: Diagnosis not present

## 2020-04-25 DIAGNOSIS — J309 Allergic rhinitis, unspecified: Secondary | ICD-10-CM | POA: Diagnosis not present

## 2020-04-26 DIAGNOSIS — Z79891 Long term (current) use of opiate analgesic: Secondary | ICD-10-CM | POA: Diagnosis not present

## 2020-04-26 DIAGNOSIS — M25569 Pain in unspecified knee: Secondary | ICD-10-CM | POA: Diagnosis not present

## 2020-04-26 DIAGNOSIS — M797 Fibromyalgia: Secondary | ICD-10-CM | POA: Diagnosis not present

## 2020-04-26 DIAGNOSIS — G4700 Insomnia, unspecified: Secondary | ICD-10-CM | POA: Diagnosis not present

## 2020-04-26 DIAGNOSIS — M545 Low back pain, unspecified: Secondary | ICD-10-CM | POA: Diagnosis not present

## 2020-05-08 DIAGNOSIS — Z299 Encounter for prophylactic measures, unspecified: Secondary | ICD-10-CM | POA: Diagnosis not present

## 2020-05-08 DIAGNOSIS — N183 Chronic kidney disease, stage 3 unspecified: Secondary | ICD-10-CM | POA: Diagnosis not present

## 2020-05-08 DIAGNOSIS — I10 Essential (primary) hypertension: Secondary | ICD-10-CM | POA: Diagnosis not present

## 2020-05-08 DIAGNOSIS — J449 Chronic obstructive pulmonary disease, unspecified: Secondary | ICD-10-CM | POA: Diagnosis not present

## 2020-05-08 DIAGNOSIS — M1710 Unilateral primary osteoarthritis, unspecified knee: Secondary | ICD-10-CM | POA: Diagnosis not present

## 2020-05-08 DIAGNOSIS — F17210 Nicotine dependence, cigarettes, uncomplicated: Secondary | ICD-10-CM | POA: Diagnosis not present

## 2020-05-10 DIAGNOSIS — M1710 Unilateral primary osteoarthritis, unspecified knee: Secondary | ICD-10-CM | POA: Diagnosis not present

## 2020-05-30 ENCOUNTER — Ambulatory Visit: Payer: Medicare Other | Admitting: Orthopaedic Surgery

## 2020-06-10 DIAGNOSIS — M1710 Unilateral primary osteoarthritis, unspecified knee: Secondary | ICD-10-CM | POA: Diagnosis not present

## 2020-06-21 DIAGNOSIS — M545 Low back pain, unspecified: Secondary | ICD-10-CM | POA: Diagnosis not present

## 2020-06-21 DIAGNOSIS — G4700 Insomnia, unspecified: Secondary | ICD-10-CM | POA: Diagnosis not present

## 2020-06-21 DIAGNOSIS — Z79891 Long term (current) use of opiate analgesic: Secondary | ICD-10-CM | POA: Diagnosis not present

## 2020-06-21 DIAGNOSIS — M797 Fibromyalgia: Secondary | ICD-10-CM | POA: Diagnosis not present

## 2020-06-21 DIAGNOSIS — M25569 Pain in unspecified knee: Secondary | ICD-10-CM | POA: Diagnosis not present

## 2020-07-02 DIAGNOSIS — I701 Atherosclerosis of renal artery: Secondary | ICD-10-CM | POA: Diagnosis not present

## 2020-07-02 DIAGNOSIS — I2510 Atherosclerotic heart disease of native coronary artery without angina pectoris: Secondary | ICD-10-CM | POA: Diagnosis not present

## 2020-07-02 DIAGNOSIS — I10 Essential (primary) hypertension: Secondary | ICD-10-CM | POA: Diagnosis not present

## 2020-07-02 DIAGNOSIS — M2550 Pain in unspecified joint: Secondary | ICD-10-CM | POA: Diagnosis not present

## 2020-07-02 DIAGNOSIS — Z299 Encounter for prophylactic measures, unspecified: Secondary | ICD-10-CM | POA: Diagnosis not present

## 2020-07-09 DIAGNOSIS — Z299 Encounter for prophylactic measures, unspecified: Secondary | ICD-10-CM | POA: Diagnosis not present

## 2020-07-09 DIAGNOSIS — S0083XA Contusion of other part of head, initial encounter: Secondary | ICD-10-CM | POA: Diagnosis not present

## 2020-07-09 DIAGNOSIS — I10 Essential (primary) hypertension: Secondary | ICD-10-CM | POA: Diagnosis not present

## 2020-07-09 DIAGNOSIS — Y92009 Unspecified place in unspecified non-institutional (private) residence as the place of occurrence of the external cause: Secondary | ICD-10-CM | POA: Diagnosis not present

## 2020-07-09 DIAGNOSIS — W19XXXA Unspecified fall, initial encounter: Secondary | ICD-10-CM | POA: Diagnosis not present

## 2020-07-09 DIAGNOSIS — M545 Low back pain, unspecified: Secondary | ICD-10-CM | POA: Diagnosis not present

## 2020-07-09 DIAGNOSIS — Z1231 Encounter for screening mammogram for malignant neoplasm of breast: Secondary | ICD-10-CM | POA: Diagnosis not present

## 2020-07-10 DIAGNOSIS — M25569 Pain in unspecified knee: Secondary | ICD-10-CM | POA: Diagnosis not present

## 2020-07-10 DIAGNOSIS — M545 Low back pain, unspecified: Secondary | ICD-10-CM | POA: Diagnosis not present

## 2020-07-10 DIAGNOSIS — M1710 Unilateral primary osteoarthritis, unspecified knee: Secondary | ICD-10-CM | POA: Diagnosis not present

## 2020-07-10 DIAGNOSIS — S0993XA Unspecified injury of face, initial encounter: Secondary | ICD-10-CM | POA: Diagnosis not present

## 2020-07-10 DIAGNOSIS — S0990XA Unspecified injury of head, initial encounter: Secondary | ICD-10-CM | POA: Diagnosis not present

## 2020-07-10 DIAGNOSIS — Z79891 Long term (current) use of opiate analgesic: Secondary | ICD-10-CM | POA: Diagnosis not present

## 2020-07-12 ENCOUNTER — Other Ambulatory Visit (HOSPITAL_COMMUNITY): Payer: Self-pay | Admitting: Neurology

## 2020-07-12 DIAGNOSIS — S0993XA Unspecified injury of face, initial encounter: Secondary | ICD-10-CM

## 2020-08-10 DIAGNOSIS — I10 Essential (primary) hypertension: Secondary | ICD-10-CM | POA: Diagnosis not present

## 2020-08-10 DIAGNOSIS — R5383 Other fatigue: Secondary | ICD-10-CM | POA: Diagnosis not present

## 2020-08-10 DIAGNOSIS — I701 Atherosclerosis of renal artery: Secondary | ICD-10-CM | POA: Diagnosis not present

## 2020-08-10 DIAGNOSIS — M1710 Unilateral primary osteoarthritis, unspecified knee: Secondary | ICD-10-CM | POA: Diagnosis not present

## 2020-08-10 DIAGNOSIS — Z87891 Personal history of nicotine dependence: Secondary | ICD-10-CM | POA: Diagnosis not present

## 2020-08-10 DIAGNOSIS — Z299 Encounter for prophylactic measures, unspecified: Secondary | ICD-10-CM | POA: Diagnosis not present

## 2020-08-10 DIAGNOSIS — R410 Disorientation, unspecified: Secondary | ICD-10-CM | POA: Diagnosis not present

## 2020-08-10 DIAGNOSIS — E538 Deficiency of other specified B group vitamins: Secondary | ICD-10-CM | POA: Diagnosis not present

## 2020-09-03 DIAGNOSIS — M797 Fibromyalgia: Secondary | ICD-10-CM | POA: Diagnosis not present

## 2020-09-03 DIAGNOSIS — M5416 Radiculopathy, lumbar region: Secondary | ICD-10-CM | POA: Diagnosis not present

## 2020-09-03 DIAGNOSIS — Z79891 Long term (current) use of opiate analgesic: Secondary | ICD-10-CM | POA: Diagnosis not present

## 2020-09-03 DIAGNOSIS — M25569 Pain in unspecified knee: Secondary | ICD-10-CM | POA: Diagnosis not present

## 2020-09-03 DIAGNOSIS — G4700 Insomnia, unspecified: Secondary | ICD-10-CM | POA: Diagnosis not present

## 2020-09-03 DIAGNOSIS — M130 Polyarthritis, unspecified: Secondary | ICD-10-CM | POA: Diagnosis not present

## 2020-09-03 DIAGNOSIS — M545 Low back pain, unspecified: Secondary | ICD-10-CM | POA: Diagnosis not present

## 2020-09-03 DIAGNOSIS — I10 Essential (primary) hypertension: Secondary | ICD-10-CM | POA: Diagnosis not present

## 2020-09-10 DIAGNOSIS — M1710 Unilateral primary osteoarthritis, unspecified knee: Secondary | ICD-10-CM | POA: Diagnosis not present

## 2020-09-17 DIAGNOSIS — Z299 Encounter for prophylactic measures, unspecified: Secondary | ICD-10-CM | POA: Diagnosis not present

## 2020-09-17 DIAGNOSIS — N1831 Chronic kidney disease, stage 3a: Secondary | ICD-10-CM | POA: Diagnosis not present

## 2020-09-17 DIAGNOSIS — R519 Headache, unspecified: Secondary | ICD-10-CM | POA: Diagnosis not present

## 2020-09-17 DIAGNOSIS — J069 Acute upper respiratory infection, unspecified: Secondary | ICD-10-CM | POA: Diagnosis not present

## 2020-10-01 DIAGNOSIS — Z79891 Long term (current) use of opiate analgesic: Secondary | ICD-10-CM | POA: Diagnosis not present

## 2020-10-01 DIAGNOSIS — I10 Essential (primary) hypertension: Secondary | ICD-10-CM | POA: Diagnosis not present

## 2020-10-01 DIAGNOSIS — M25569 Pain in unspecified knee: Secondary | ICD-10-CM | POA: Diagnosis not present

## 2020-10-01 DIAGNOSIS — G4700 Insomnia, unspecified: Secondary | ICD-10-CM | POA: Diagnosis not present

## 2020-10-01 DIAGNOSIS — M797 Fibromyalgia: Secondary | ICD-10-CM | POA: Diagnosis not present

## 2020-10-01 DIAGNOSIS — M5416 Radiculopathy, lumbar region: Secondary | ICD-10-CM | POA: Diagnosis not present

## 2020-10-01 DIAGNOSIS — M545 Low back pain, unspecified: Secondary | ICD-10-CM | POA: Diagnosis not present

## 2020-10-01 DIAGNOSIS — M130 Polyarthritis, unspecified: Secondary | ICD-10-CM | POA: Diagnosis not present

## 2020-10-03 DIAGNOSIS — Z299 Encounter for prophylactic measures, unspecified: Secondary | ICD-10-CM | POA: Diagnosis not present

## 2020-10-03 DIAGNOSIS — I10 Essential (primary) hypertension: Secondary | ICD-10-CM | POA: Diagnosis not present

## 2020-10-03 DIAGNOSIS — I2510 Atherosclerotic heart disease of native coronary artery without angina pectoris: Secondary | ICD-10-CM | POA: Diagnosis not present

## 2020-10-03 DIAGNOSIS — I701 Atherosclerosis of renal artery: Secondary | ICD-10-CM | POA: Diagnosis not present

## 2020-10-03 DIAGNOSIS — J449 Chronic obstructive pulmonary disease, unspecified: Secondary | ICD-10-CM | POA: Diagnosis not present

## 2020-10-08 DIAGNOSIS — M1710 Unilateral primary osteoarthritis, unspecified knee: Secondary | ICD-10-CM | POA: Diagnosis not present

## 2021-06-01 ENCOUNTER — Encounter (HOSPITAL_COMMUNITY): Payer: Self-pay | Admitting: Emergency Medicine

## 2021-06-01 ENCOUNTER — Other Ambulatory Visit: Payer: Self-pay

## 2021-06-01 ENCOUNTER — Emergency Department (HOSPITAL_COMMUNITY)
Admission: EM | Admit: 2021-06-01 | Discharge: 2021-06-01 | Disposition: A | Discharge status: Home / Self Care | Payer: 59 | Attending: Emergency Medicine | Admitting: Emergency Medicine

## 2021-06-01 ENCOUNTER — Emergency Department (HOSPITAL_COMMUNITY): Payer: 59

## 2021-06-01 DIAGNOSIS — Z96652 Presence of left artificial knee joint: Secondary | ICD-10-CM | POA: Diagnosis not present

## 2021-06-01 DIAGNOSIS — Z7982 Long term (current) use of aspirin: Secondary | ICD-10-CM | POA: Insufficient documentation

## 2021-06-01 DIAGNOSIS — Z87891 Personal history of nicotine dependence: Secondary | ICD-10-CM | POA: Diagnosis not present

## 2021-06-01 DIAGNOSIS — I2510 Atherosclerotic heart disease of native coronary artery without angina pectoris: Secondary | ICD-10-CM | POA: Diagnosis not present

## 2021-06-01 DIAGNOSIS — N3090 Cystitis, unspecified without hematuria: Secondary | ICD-10-CM

## 2021-06-01 DIAGNOSIS — Z7902 Long term (current) use of antithrombotics/antiplatelets: Secondary | ICD-10-CM | POA: Diagnosis not present

## 2021-06-01 DIAGNOSIS — Z79899 Other long term (current) drug therapy: Secondary | ICD-10-CM | POA: Diagnosis not present

## 2021-06-01 DIAGNOSIS — R319 Hematuria, unspecified: Secondary | ICD-10-CM | POA: Diagnosis present

## 2021-06-01 DIAGNOSIS — N3091 Cystitis, unspecified with hematuria: Secondary | ICD-10-CM | POA: Insufficient documentation

## 2021-06-01 DIAGNOSIS — I10 Essential (primary) hypertension: Secondary | ICD-10-CM | POA: Diagnosis not present

## 2021-06-01 HISTORY — DX: Cyst of kidney, acquired: N28.1

## 2021-06-01 LAB — CBC WITH DIFFERENTIAL/PLATELET
Abs Immature Granulocytes: 0.02 10*3/uL (ref 0.00–0.07)
Basophils Absolute: 0 10*3/uL (ref 0.0–0.1)
Basophils Relative: 1 %
Eosinophils Absolute: 0.2 10*3/uL (ref 0.0–0.5)
Eosinophils Relative: 3 %
HCT: 44.1 % (ref 36.0–46.0)
Hemoglobin: 14.9 g/dL (ref 12.0–15.0)
Immature Granulocytes: 0 %
Lymphocytes Relative: 25 %
Lymphs Abs: 1.6 10*3/uL (ref 0.7–4.0)
MCH: 32.3 pg (ref 26.0–34.0)
MCHC: 33.8 g/dL (ref 30.0–36.0)
MCV: 95.7 fL (ref 80.0–100.0)
Monocytes Absolute: 0.6 10*3/uL (ref 0.1–1.0)
Monocytes Relative: 9 %
Neutro Abs: 3.9 10*3/uL (ref 1.7–7.7)
Neutrophils Relative %: 62 %
Platelets: 181 10*3/uL (ref 150–400)
RBC: 4.61 MIL/uL (ref 3.87–5.11)
RDW: 12.1 % (ref 11.5–15.5)
WBC: 6.3 10*3/uL (ref 4.0–10.5)
nRBC: 0 % (ref 0.0–0.2)

## 2021-06-01 LAB — COMPREHENSIVE METABOLIC PANEL
ALT: 14 U/L (ref 0–44)
AST: 20 U/L (ref 15–41)
Albumin: 4.8 g/dL (ref 3.5–5.0)
Alkaline Phosphatase: 81 U/L (ref 38–126)
Anion gap: 12 (ref 5–15)
BUN: 22 mg/dL (ref 8–23)
CO2: 25 mmol/L (ref 22–32)
Calcium: 9.2 mg/dL (ref 8.9–10.3)
Chloride: 97 mmol/L — ABNORMAL LOW (ref 98–111)
Creatinine, Ser: 1.27 mg/dL — ABNORMAL HIGH (ref 0.44–1.00)
GFR, Estimated: 45 mL/min — ABNORMAL LOW (ref >60)
Glucose, Bld: 94 mg/dL (ref 70–99)
Potassium: 3.8 mmol/L (ref 3.5–5.1)
Sodium: 134 mmol/L — ABNORMAL LOW (ref 135–145)
Total Bilirubin: 0.6 mg/dL (ref 0.3–1.2)
Total Protein: 8.3 g/dL — ABNORMAL HIGH (ref 6.5–8.1)

## 2021-06-01 LAB — URINALYSIS, ROUTINE W REFLEX MICROSCOPIC
Bilirubin Urine: NEGATIVE
Glucose, UA: NEGATIVE mg/dL
Hgb urine dipstick: NEGATIVE
Ketones, ur: NEGATIVE mg/dL
Nitrite: NEGATIVE
Protein, ur: 30 mg/dL — AB
Specific Gravity, Urine: 1.011 (ref 1.005–1.030)
WBC, UA: >50 WBC/hpf — ABNORMAL HIGH (ref 0–5)
pH: 6 (ref 5.0–8.0)

## 2021-06-01 MED ORDER — HYDROCODONE-ACETAMINOPHEN 5-325 MG PO TABS: 1.0000 | ORAL_TABLET | Freq: Four times a day (QID) | ORAL | 0 refills | Status: DC | PRN

## 2021-06-01 MED ORDER — HYDROMORPHONE HCL 1 MG/ML IJ SOLN
0.5000 mg | Freq: Once | INTRAMUSCULAR | Status: AC
  Administered 2021-06-01: 0.5 mg via INTRAVENOUS
  Filled 2021-06-01: qty 1

## 2021-06-01 MED ORDER — CEPHALEXIN 500 MG PO CAPS: 500.0000 mg | ORAL_CAPSULE | Freq: Four times a day (QID) | ORAL | 0 refills | Status: DC

## 2021-06-01 MED ORDER — PROCHLORPERAZINE EDISYLATE 10 MG/2ML IJ SOLN
5.0000 mg | Freq: Once | INTRAMUSCULAR | Status: AC
  Administered 2021-06-01: 5 mg via INTRAVENOUS
  Filled 2021-06-01: qty 2

## 2021-06-01 MED ORDER — STERILE WATER FOR INJECTION IJ SOLN
INTRAMUSCULAR | Status: AC
  Filled 2021-06-01: qty 10

## 2021-06-01 MED ORDER — PROCHLORPERAZINE 25 MG RE SUPP: 25.0000 mg | Freq: Two times a day (BID) | RECTAL | 0 refills | Status: DC | PRN

## 2021-06-01 MED ORDER — CEFTRIAXONE SODIUM 1 G IJ SOLR
1.0000 g | Freq: Once | INTRAMUSCULAR | Status: AC
  Administered 2021-06-01: 1 g via INTRAMUSCULAR
  Filled 2021-06-01: qty 10

## 2021-06-01 NOTE — ED Triage Notes (Signed)
Pt c/o bilateral flank pain and hematuria. Pt states pain started a few weeks ago and she had a CT scan and an [place] that showed two cysts on her kidneys. Pt states pain is getting worse.

## 2021-06-01 NOTE — ED Provider Notes (Signed)
[facility] EMERGENCY DEPARTMENT Provider Note   CSN: [id number] Arrival date & time: 06/01/21  1546     History Chief Complaint  Patient presents with   Hematuria    [name] is a 70 y.o. female.  Patient with blood in her urine and pain in her flank.  No fever no chills no vomiting  The history is provided by the patient and medical records. No language interpreter was used.  Hematuria This is a new problem. The current episode started 6 to 12 hours ago. The problem occurs constantly. The problem has not changed since onset.Pertinent negatives include no chest pain, no abdominal pain and no headaches. Nothing aggravates the symptoms. Nothing relieves the symptoms. She has tried nothing for the symptoms. The treatment provided no relief.      Past Medical History:  Diagnosis Date   Anxiety    Arthritis    Rhumatoid and Osteoarthritis   Carotid artery disease (HCC)    Dr. Trula Slade, 60-79% RICA, s/p left CEA   Coronary atherosclerosis of native coronary artery    DES RCA/circumflex 4/10, LVEF 65   Depression    Essential hypertension, benign    Fibromyalgia    GERD (gastroesophageal reflux disease)    Hiatal hernia    Hyperlipidemia    Kidney cysts    NSTEMI (non-ST elevated myocardial infarction) ([place])    4/10   Renal artery stenosis (HCC)    Left renal artery stent 12/13   Restless leg syndrome    Spondylosis without myelopathy     Patient Active Problem List   Diagnosis Date Noted   AMS (altered mental status) 03/11/2020   AKI (acute kidney injury) ([place]) 03/11/2020   Hypokalemia 03/11/2020   Altered mental status 03/11/2020   Depression    Prolonged QT interval    Unilateral primary osteoarthritis, right knee 02/22/2020   Gastroesophageal reflux disease without esophagitis 01/25/2018   History of colonic polyps 03/23/2017   Melena 01/31/2014   Aftercare following surgery of the circulatory system, NEC 01/30/2014   Tooth pain 05/03/2013   GERD  (gastroesophageal reflux disease) 02/16/2013   Bloating 02/16/2013   Renal artery stenosis ([place]) 11/04/2012   PAD (peripheral artery disease) ([place]) 05/07/2012   Shortness of breath 04/14/2012   CAROTID OCCLUSIVE DISEASE 11/13/2009   Hyperlipidemia 04/19/2009   Essential hypertension, benign 03/12/2009   CAD (coronary artery disease) 03/12/2009    Past Surgical History:  Procedure Laterality Date   ABDOMINAL HYSTERECTOMY  1977   BREAST LUMPECTOMY     CAROTID ENDARTERECTOMY Left 02/28/2009   CHOLECYSTECTOMY  1991   COLONOSCOPY     COLONOSCOPY N/A 05/08/2017   Procedure: COLONOSCOPY;  Surgeon: Rogene Houston, MD;  [facility];  Service: Endoscopy;  Laterality: N/A;  12:00   ESOPHAGOGASTRODUODENOSCOPY N/A 04/07/2013   Procedure: ESOPHAGOGASTRODUODENOSCOPY (EGD);  Surgeon: Rogene Houston, MD;  [facility];  Service: Endoscopy;  Laterality: N/A;  250   ESOPHAGOGASTRODUODENOSCOPY N/A 02/02/2014   Procedure: ESOPHAGOGASTRODUODENOSCOPY (EGD);  Surgeon: Rogene Houston, MD;  [facility];  Service: Endoscopy;  Laterality: N/A;  120   EYE SURGERY     Catartact bilateral   JOINT REPLACEMENT  March 2013   Left knee   JOINT REPLACEMENT  2006   Left Knee   kidney stent     Left December 2013   LUMBAR DISC SURGERY     L5-S1   MULTIPLE TOOTH EXTRACTIONS  Jan. 2015   Neck Fusion  1996  C4 to C6   PERCUTANEOUS STENT INTERVENTION Left 06/15/2012   Procedure: PERCUTANEOUS STENT INTERVENTION;  Surgeon: Serafina Mitchell, MD;  Location: [facility] CATH LAB;  Service: Cardiovascular;  Laterality: Left;  renal   PERIPHERAL VASCULAR BALLOON ANGIOPLASTY  05/26/2017   Procedure: PERIPHERAL VASCULAR BALLOON ANGIOPLASTY;  Surgeon: Serafina Mitchell, MD;  Location: [place] CV LAB;  Service: Cardiovascular;;  Lt. Renal Angioplasty   POLYPECTOMY  05/08/2017   Procedure: POLYPECTOMY;  Surgeon: Rogene Houston, MD;  [facility];  Service: Endoscopy;;  colon    RENAL  ANGIOGRAM N/A 06/15/2012   Procedure: RENAL ANGIOGRAM;  Surgeon: Serafina Mitchell, MD;  Location: [facility] CATH LAB;  Service: Cardiovascular;  Laterality: N/A;   RENAL ANGIOGRAPHY  05/26/2017   Procedure: RENAL ANGIOGRAPHY;  Surgeon: Serafina Mitchell, MD;  Location: [place] CV LAB;  Service: Cardiovascular;;   SPINE SURGERY  1994   Ruptured disc   TONSILECTOMY, ADENOIDECTOMY, BILATERAL MYRINGOTOMY AND TUBES  1969   TOTAL KNEE ARTHROPLASTY     Left   TUBAL LIGATION       OB History   No obstetric history on file.     Family History  Problem Relation Age of Onset   Other Mother        Cerebral hemorrage   Heart disease Mother    Heart attack Mother    Hyperlipidemia Mother    Heart disease Father    Heart attack Father    Hyperlipidemia Father    Heart disease Brother        Heart Disease before age 9   Diabetes Brother    Hypertension Brother    Heart attack Brother    Cancer Sister    Arthritis Sister    Heart disease Sister    Heart disease Daughter 30       Heart Disease before age 64   Heart attack Daughter    Hypertension Daughter    Cancer Sister    Arthritis Sister    Diabetes Brother        Varicose  Veins   Peripheral vascular disease Brother    Diabetes Brother     Social History   Tobacco Use   Smoking status: Former    Years: 45.00    Types: Cigarettes    Quit date: 10/2017    Years since quitting: 3.6   Smokeless tobacco: Never  Vaping Use   Vaping Use: Never used  Substance Use Topics   Alcohol use: No    Alcohol/week: 0.0 standard drinks   Drug use: No    Home Medications Prior to Admission medications   Medication Sig Start Date End Date Taking? Authorizing Provider  albuterol (VENTOLIN HFA) 108 (90 Base) MCG/ACT inhaler 1 puff 4 (four) times daily as needed for wheezing or shortness of breath. 04/22/21  Yes [provider]  Camphor-Menthol-Methyl Sal (SALONPAS) 3.07-19-08 % PTCH Apply 1 patch topically daily as needed.   Yes  [provider]  cephALEXin (KEFLEX) 500 MG capsule Take 1 capsule (500 mg total) by mouth 4 (four) times daily. 06/01/21  Yes Milton Ferguson, MD  DAYVIGO 5 MG TABS Take 1 tablet by mouth at bedtime as needed. 05/08/21  Yes [provider]  diazepam (VALIUM) 5 MG tablet Take 5 mg by mouth 2 (two) times daily as needed. 05/04/21  Yes [provider]  diclofenac Sodium (VOLTAREN) 1 % GEL Apply 2 g topically 4 (four) times daily.   Yes [provider]  DULoxetine (CYMBALTA) 20 MG capsule Take 20 mg by mouth daily. 05/28/21  Yes [provider]  EPINEPHrine (EPIPEN JR) 0.15 MG/0.3ML injection Inject into the muscle.   Yes [provider]  fluticasone (FLONASE) 50 MCG/ACT nasal spray Place 2 sprays into both nostrils daily as needed for allergies.    Yes [provider]  levothyroxine (SYNTHROID) 25 MCG tablet Take 25 mcg by mouth daily. 02/16/20  Yes [provider]  meloxicam (MOBIC) 15 MG tablet Take by mouth.   Yes [provider]  metoprolol succinate (TOPROL-XL) 25 MG 24 hr tablet Take 25 mg by mouth 2 (two) times daily. 05/30/21  Yes [provider]  Multiple Vitamins-Minerals (ALIVE WOMENS GUMMY PO) Take 1 Dose by mouth daily.   Yes [provider]  nitroGLYCERIN (NITROSTAT) 0.4 MG SL tablet Place 0.4 mg under the tongue every 5 (five) minutes as needed for chest pain.    Yes [provider]  omeprazole (PRILOSEC) 20 MG capsule Take 20 mg by mouth daily. 02/16/20  Yes [provider]  prochlorperazine (COMPAZINE) 25 MG suppository Place 1 suppository (25 mg total) rectally every 12 (twelve) hours as needed for nausea or vomiting. 06/01/21  Yes Milton Ferguson, MD  rosuvastatin (CRESTOR) 10 MG tablet Take 10 mg by mouth at bedtime. 05/28/21  Yes [provider]  sertraline (ZOLOFT) 50 MG tablet Take 50 mg by mouth daily. 05/28/21  Yes [provider]   triamterene-hydrochlorothiazide (MAXZIDE-25) 37.5-25 MG per tablet Take 0.5 tablets by mouth every morning.    Yes [provider]  vitamin B-12 (CYANOCOBALAMIN) 1000 MCG tablet Take 2 tablets (2,000 mcg total) by mouth daily. Patient taking differently: Take 1,000 mcg by mouth daily. 03/13/20  Yes Aline August, MD  Vitamin D, Ergocalciferol, (DRISDOL) 50000 UNITS CAPS Take 50,000 Units by mouth every Monday.    Yes [provider]  zinc gluconate 50 MG tablet Take 50 mg by mouth daily.   Yes [provider]  aspirin 81 MG EC tablet Take by mouth. Patient not taking: Reported on 06/01/2021    [provider]  clopidogrel (PLAVIX) 75 MG tablet Take 75 mg daily after breakfast by mouth.  Patient not taking: Reported on 06/01/2021 05/09/17   Rogene Houston, MD  diclofenac (VOLTAREN) 25 MG EC tablet Take by mouth. Patient not taking: Reported on 06/01/2021    [provider]  HYDROcodone-acetaminophen (NORCO/VICODIN) 5-325 MG tablet Take 1 tablet by mouth every 6 (six) hours as needed for moderate pain. 06/01/21   Milton Ferguson, MD  omeprazole (PRILOSEC) 10 MG capsule Take by mouth. Patient not taking: Reported on 06/01/2021    [provider]  potassium gluconate 595 (99 K) MG TABS tablet Take 595 mg by mouth daily. Patient not taking: Reported on 06/01/2021    [provider]  promethazine (PHENERGAN) 25 MG tablet Take 25 mg by mouth 4 (four) times daily as needed. Patient not taking: Reported on 06/01/2021 03/04/21   [provider]  trimethoprim-polymyxin b (POLYTRIM) ophthalmic solution Place 1 drop into the right eye every 4 (four) hours. Patient not taking: Reported on 06/01/2021    [provider]    Allergies    Celecoxib, Codeine, Dicyclomine, Lamictal [lamotrigine], Metoclopramide hcl, Morphine and related, Savella [milnacipran hcl], Simvastatin, Tape, Trazodone and nefazodone, Venlafaxine, Citalopram,  and Sertraline hcl  Review of Systems   Review of Systems  Constitutional:  Negative for appetite change and fatigue.  HENT:  Negative for  congestion, ear discharge and sinus pressure.   Eyes:  Negative for discharge.  Respiratory:  Negative for cough.   Cardiovascular:  Negative for chest pain.  Gastrointestinal:  Negative for abdominal pain and diarrhea.  Genitourinary:  Positive for hematuria. Negative for frequency.  Musculoskeletal:  Negative for back pain.  Skin:  Negative for rash.  Neurological:  Negative for seizures and headaches.  Psychiatric/Behavioral:  Negative for hallucinations.    Physical Exam Updated Vital Signs BP (!) 133/46   Pulse 60   Temp 98.3 F (36.8 C) (Oral)   Resp 17   Ht 4\' 11"  (1.499 m)   Wt 73.9 kg   SpO2 94%   BMI 32.92 kg/m   Physical Exam Vitals and nursing note reviewed.  Constitutional:      Appearance: She is well-developed.  HENT:     Head: Normocephalic.     Nose: Nose normal.  Eyes:     General: No scleral icterus.    Conjunctiva/sclera: Conjunctivae normal.  Neck:     Thyroid: No thyromegaly.  Cardiovascular:     Rate and Rhythm: Normal rate and regular rhythm.     Heart sounds: No murmur heard.   No friction rub. No gallop.  Pulmonary:     Breath sounds: No stridor. No wheezing or rales.  Chest:     Chest wall: No tenderness.  Abdominal:     General: There is no distension.     Tenderness: There is no abdominal tenderness. There is no rebound.  Musculoskeletal:        General: Normal range of motion.     Cervical back: Neck supple.  Lymphadenopathy:     Cervical: No cervical adenopathy.  Skin:    Findings: No erythema or rash.  Neurological:     Mental Status: She is alert and oriented to person, place, and time.     Motor: No abnormal muscle tone.     Coordination: Coordination normal.  Psychiatric:        Behavior: Behavior normal.    ED Results / Procedures / Treatments   Labs (all labs ordered are  listed, but only abnormal results are displayed) Labs Reviewed  COMPREHENSIVE METABOLIC PANEL - Abnormal; Notable for the following components:      Result Value   Sodium 134 (*)    Chloride 97 (*)    Creatinine, Ser 1.27 (*)    Total Protein 8.3 (*)    GFR, Estimated 45 (*)    All other components within normal limits  URINALYSIS, ROUTINE W REFLEX MICROSCOPIC - Abnormal; Notable for the following components:   APPearance HAZY (*)    Protein, ur 30 (*)    Leukocytes,Ua LARGE (*)    WBC, UA >50 (*)    Bacteria, UA RARE (*)    All other components within normal limits  URINE CULTURE  CBC WITH DIFFERENTIAL/PLATELET    EKG None  Radiology CT Renal Stone Study  Result Date: 06/01/2021 CLINICAL DATA:  Patient complaining of bilateral flank pain and lower pelvic pain with hematuria for 1 month. EXAM: CT ABDOMEN AND PELVIS WITHOUT CONTRAST TECHNIQUE: Multidetector CT imaging of the abdomen and pelvis was performed following the standard protocol without IV contrast. COMPARISON:  Abdominal CT 04/25/2021 FINDINGS: Lower chest: No acute abnormality. Evaluation of the abdominal viscera limited by the lack of IV contrast. Hepatobiliary: No focal liver abnormality is seen. Status post cholecystectomy. Pancreas: Unremarkable. No surrounding inflammatory changes. Spleen: Normal in size without focal abnormality. Adrenals/Urinary Tract: Adrenal  glands are unremarkable. Kidneys are symmetric in size. Bilateral small exophytic renal cyst. Small bilateral vascular calculi in the kidneys. No renal stones. Mild diffuse bladder wall thickening. Stomach/Bowel: Stomach is within normal limits. No evidence of bowel wall thickening, distention, or inflammatory changes. Vascular/Lymphatic: Aortic atherosclerosis. Vascular patency cannot be assessed in the absence of IV contrast. No enlarged abdominal or pelvic lymph nodes. Reproductive: Status post hysterectomy. No adnexal masses. Other: No abdominal wall hernia or  abnormality. No abdominopelvic ascites. Musculoskeletal: Multilevel degenerative disc disease in the lumbar spine. IMPRESSION: 1. Mild diffuse bladder wall thickening. Correlate with urinalysis to exclude cystitis. 2. Small bilateral vascular calculi in the kidneys. No renal stones. Bilateral renal cysts. 3. Aortic atherosclerosis. Aortic Atherosclerosis (ICD10-I70.0). Electronically Signed   By: Audie Pinto M.D.   On: 06/01/2021 17:44    Procedures Procedures   Medications Ordered in ED Medications  cefTRIAXone (ROCEPHIN) injection 1 g (has no administration in time range)  prochlorperazine (COMPAZINE) injection 5 mg (5 mg Intravenous Given 06/01/21 1743)  HYDROmorphone (DILAUDID) injection 0.5 mg (0.5 mg Intravenous Given 06/01/21 1743)    ED Course  I have reviewed the triage vital signs and the nursing notes.  Pertinent labs & imaging results that were available during my care of the patient were reviewed by me and considered in my medical decision making (see chart for details).    MDM Rules/Calculators/A&P                           Patient with cystitis.  She will be placed on Keflex and follow-up with [facility] Final Clinical Impression(s) / ED Diagnoses Final diagnoses:  Cystitis    Rx / DC Orders ED Discharge Orders          Ordered    cephALEXin (KEFLEX) 500 MG capsule  4 times daily        06/01/21 1912    HYDROcodone-acetaminophen (NORCO/VICODIN) 5-325 MG tablet  Every 6 hours PRN,   Status:  Discontinued        06/01/21 1912    HYDROcodone-acetaminophen (NORCO/VICODIN) 5-325 MG tablet  Every 6 hours PRN        06/01/21 1914    prochlorperazine (COMPAZINE) 25 MG suppository  Every 12 hours PRN        06/01/21 1915             Milton Ferguson, MD 06/03/21 1108

## 2021-06-01 NOTE — Discharge Instructions (Signed)
Drink plenty of fluids.  Follow-up with [facility] next week

## 2021-06-04 LAB — URINE CULTURE: Culture: >=100000 — AB

## 2021-06-12 NOTE — Progress Notes (Signed)
Assessment: 1. Mixed incontinence   2. History of UTI   3. Lower abdominal pain   4. Chronic bilateral low back pain, unspecified whether sciatica present     Plan: I reviewed the patient's records from her ER visit on 06/01/2021.  I also personally reviewed the CT study from 06/01/2021 with results noted below.  Her pelvic exam does not show any significant prolapse.  No evidence of UTI or hematuria today.  I discussed a trial of medical therapy for her lower urinary tract symptoms and incontinence.  I do not think that her back pain is urologic in nature. Trial of Gemtesa 75 mg daily.  Samples given. Return to office in 1 month  Chief Complaint:  Chief Complaint  Patient presents with   Abdominal Pain     History of Present Illness:  [name] is a 70 y.o. year old female who is seen in consultation from Jerene Bears B, MD for evaluation of lower abdominal and pelvic pain and recent UTI.  She presented to the emergency room on 06/01/2021 with back and lower abdominal pain.  She also reported blood in her urine.  She was evaluated with a CT scan which showed bilateral renal cyst, small bilateral vascular calcifications in both kidneys, no renal or ureteral calculi, and no evidence of obstruction.  Urine culture grew >100 K E. coli.  She was treated with cephalexin which she has completed.  She continues to have some low back pain with radiation into the gluteal area, lower abdominal and pelvic pain, urinary frequency, urgency, nocturia 3-4 times, urge and stress incontinence.  She is not having any dysuria or gross hematuria at the present time.   Past Medical History:  Past Medical History:  Diagnosis Date   Anxiety    Arthritis    Rhumatoid and Osteoarthritis   Carotid artery disease (HCC)    Dr. Trula Slade, 72-79% RICA, s/p left CEA   Coronary atherosclerosis of native coronary artery    DES RCA/circumflex 4/10, LVEF 65   Depression    Essential hypertension, benign     Fibromyalgia    GERD (gastroesophageal reflux disease)    Hiatal hernia    Hyperlipidemia    Kidney cysts    NSTEMI (non-ST elevated myocardial infarction) ([place])    4/10   Renal artery stenosis (HCC)    Left renal artery stent 12/13   Restless leg syndrome    Spondylosis without myelopathy     Past Surgical History:  Past Surgical History:  Procedure Laterality Date   ABDOMINAL HYSTERECTOMY  1977   BREAST LUMPECTOMY     CAROTID ENDARTERECTOMY Left 02/28/2009   CHOLECYSTECTOMY  1991   COLONOSCOPY     COLONOSCOPY N/A 05/08/2017   Procedure: COLONOSCOPY;  Surgeon: Rogene Houston, MD;  [facility];  Service: Endoscopy;  Laterality: N/A;  12:00   ESOPHAGOGASTRODUODENOSCOPY N/A 04/07/2013   Procedure: ESOPHAGOGASTRODUODENOSCOPY (EGD);  Surgeon: Rogene Houston, MD;  [facility];  Service: Endoscopy;  Laterality: N/A;  250   ESOPHAGOGASTRODUODENOSCOPY N/A 02/02/2014   Procedure: ESOPHAGOGASTRODUODENOSCOPY (EGD);  Surgeon: Rogene Houston, MD;  [facility];  Service: Endoscopy;  Laterality: N/A;  120   EYE SURGERY     Catartact bilateral   JOINT REPLACEMENT  March 2013   Left knee   JOINT REPLACEMENT  2006   Left Knee   kidney stent     Left December 2013   LUMBAR DISC SURGERY  L5-S1   MULTIPLE TOOTH EXTRACTIONS  Jan. 2015   Neck Fusion  1996   C4 to C6   PERCUTANEOUS STENT INTERVENTION Left 06/15/2012   Procedure: PERCUTANEOUS STENT INTERVENTION;  Surgeon: Serafina Mitchell, MD;  Location: [facility] CATH LAB;  Service: Cardiovascular;  Laterality: Left;  renal   PERIPHERAL VASCULAR BALLOON ANGIOPLASTY  05/26/2017   Procedure: PERIPHERAL VASCULAR BALLOON ANGIOPLASTY;  Surgeon: Serafina Mitchell, MD;  Location: [place] [facility];  Service: Cardiovascular;;  Lt. Renal Angioplasty   POLYPECTOMY  05/08/2017   Procedure: POLYPECTOMY;  Surgeon: Rogene Houston, MD;  [facility];  Service: Endoscopy;;  colon    RENAL ANGIOGRAM N/A 06/15/2012    Procedure: RENAL ANGIOGRAM;  Surgeon: Serafina Mitchell, MD;  Location: [facility] C[facility] LAB;  Service: Cardiovascular;  Laterality: N/A;   RENAL ANGIOGRAPHY  05/26/2017   Procedure: RENAL ANGIOGRAPHY;  Surgeon: Serafina Mitchell, MD;  Location: [place] [facility];  Service: Cardiovascular;;   SPINE SURGERY  1994   Ruptured disc   TONSILECTOMY, ADENOIDECTOMY, BILATERAL MYRINGOTOMY AND TUBES  1969   TOTAL KNEE ARTHROPLASTY     Left   TUBAL LIGATION      Allergies:  Allergies  Allergen Reactions   Celecoxib Itching   Ciprofloxacin Hcl    Codeine Itching   Dicyclomine Diarrhea and Other (See Comments)    Made diarrhea worse   Lamictal [Lamotrigine] Other (See Comments)    Unknown   Metoclopramide Hcl Other (See Comments)    Dizziness    Morphine    Morphine And Related Other (See Comments)    Patient states she is not allergic to morphine; states had morphine with no reaction   Omeprazole    Savella [Milnacipran Hcl] Other (See Comments)    Unknown    Simvastatin     Increased back and muscle pain   Tape Other (See Comments)    Skin gets red and skin pulls off, paper tape only   Tizanidine    Trazodone And Nefazodone Other (See Comments)    hallucinations   Venlafaxine Other (See Comments)    Affected eyes, blurred vision   Citalopram Palpitations and Other (See Comments)    Heart rate increased   Sertraline Hcl Other (See Comments)    Stomach upset    Family History:  Family History  Problem Relation Age of Onset   Other Mother        Cerebral hemorrage   Heart disease Mother    Heart attack Mother    Hyperlipidemia Mother    Heart disease Father    Heart attack Father    Hyperlipidemia Father    Heart disease Brother        Heart Disease before age 16   Diabetes Brother    Hypertension Brother    Heart attack Brother    Cancer Sister    Arthritis Sister    Heart disease Sister    Heart disease Daughter 4       Heart Disease before age 63   Heart attack Daughter     Hypertension Daughter    Cancer Sister    Arthritis Sister    Diabetes Brother        Varicose  Veins   Peripheral vascular disease Brother    Diabetes Brother     Social History:  Social History   Tobacco Use   Smoking status: Former    Years: 45.00    Types: Cigarettes    Quit  date: 10/2017    Years since quitting: 3.6   Smokeless tobacco: Never  Vaping Use   Vaping Use: Never used  Substance Use Topics   Alcohol use: No    Alcohol/week: 0.0 standard drinks   Drug use: No    Review of symptoms:  Constitutional:  Negative for unexplained weight loss, night sweats, fever, chills ENT:  Negative for nose bleeds, sinus pain, painful swallowing CV:  Negative for chest pain, shortness of breath, exercise intolerance, palpitations, loss of consciousness Resp:  Negative for cough, wheezing, shortness of breath GI:  Negative for nausea, vomiting, diarrhea, bloody stools GU:  Positives noted in HPI; otherwise negative for dysuria Neuro:  Negative for seizures, poor balance, limb weakness, slurred speech Psych:  Negative for lack of energy, depression, anxiety Endocrine:  Negative for polydipsia, polyuria, symptoms of hypoglycemia (dizziness, hunger, sweating) Hematologic:  Negative for anemia, purpura, petechia, prolonged or excessive bleeding, use of anticoagulants  Allergic:  Negative for difficulty breathing or choking as a result of exposure to anything; no shellfish allergy; no allergic response (rash/itch) to materials, foods  Physical exam: BP 99/66   Pulse 86   Temp 98.9 F (37.2 C)  GENERAL APPEARANCE:  Well appearing, well developed, well nourished, NAD HEENT: Atraumatic, Normocephalic, oropharynx clear. NECK: Supple without lymphadenopathy or thyromegaly. LUNGS: Clear to auscultation bilaterally. HEART: Regular Rate and Rhythm without murmurs, gallops, or rubs. ABDOMEN: Soft, non-tender, No Masses. EXTREMITIES: Moves all extremities well.  Without clubbing,  cyanosis, or edema. NEUROLOGIC:  Alert and oriented x 3, normal gait, CN II-XII grossly intact.  MENTAL STATUS:  Appropriate. BACK:  Non-tender to palpation.  No CVAT SKIN:  Warm, dry and intact.   GU: Urethra: no leakage with cough or valsalva, NT, no masses Vagina: normal mucosa, no significant cystocele or rectocele   Results: Results for orders placed or performed in visit on 06/13/21 (from the past 24 hour(s))  Urinalysis, Routine w reflex microscopic   Collection Time: 06/13/21 11:20 AM  Result Value Ref Range   Specific Gravity, UA 1.015 1.005 - 1.030   pH, UA 6.0 5.0 - 7.5   Color, UA Yellow Yellow   Appearance Ur Clear Clear   Leukocytes,UA Negative Negative   Protein,UA Negative Negative/Trace   Glucose, UA Negative Negative   Ketones, UA Negative Negative   RBC, UA Negative Negative   Bilirubin, UA Negative Negative   Urobilinogen, Ur 0.2 0.2 - 1.0 mg/dL   Nitrite, UA Negative Negative   Microscopic Examination Comment   BLADDER SCAN AMB NON-IMAGING   Collection Time: 06/13/21 12:16 PM  Result Value Ref Range   Scan Result 42

## 2021-06-13 ENCOUNTER — Encounter: Payer: Self-pay | Admitting: Urology

## 2021-06-13 ENCOUNTER — Ambulatory Visit (INDEPENDENT_AMBULATORY_CARE_PROVIDER_SITE_OTHER): Payer: 59 | Admitting: Urology

## 2021-06-13 ENCOUNTER — Other Ambulatory Visit: Payer: Self-pay

## 2021-06-13 VITALS — BP 99/66 | HR 86 | Temp 98.9°F

## 2021-06-13 DIAGNOSIS — R1030 Lower abdominal pain, unspecified: Secondary | ICD-10-CM

## 2021-06-13 DIAGNOSIS — G8929 Other chronic pain: Secondary | ICD-10-CM

## 2021-06-13 DIAGNOSIS — Z87440 Personal history of urinary (tract) infections: Secondary | ICD-10-CM

## 2021-06-13 DIAGNOSIS — N3946 Mixed incontinence: Secondary | ICD-10-CM

## 2021-06-13 DIAGNOSIS — M545 Low back pain, unspecified: Secondary | ICD-10-CM

## 2021-06-13 LAB — URINALYSIS, ROUTINE W REFLEX MICROSCOPIC
Bilirubin, UA: NEGATIVE
Glucose, UA: NEGATIVE
Ketones, UA: NEGATIVE
Leukocytes,UA: NEGATIVE
Nitrite, UA: NEGATIVE
Protein,UA: NEGATIVE
RBC, UA: NEGATIVE
Specific Gravity, UA: 1.015 (ref 1.005–1.030)
Urobilinogen, Ur: 0.2 mg/dL (ref 0.2–1.0)
pH, UA: 6 (ref 5.0–7.5)

## 2021-06-13 LAB — BLADDER SCAN AMB NON-IMAGING: Scan Result: 42

## 2021-06-13 MED ORDER — GEMTESA 75 MG PO TABS: 75.0000 mg | ORAL_TABLET | Freq: Every day | ORAL | 0 refills | Status: DC

## 2021-06-13 NOTE — Progress Notes (Signed)
Urological Symptom Review  PVR 42  Patient is experiencing the following symptoms: Frequent urination Hard to postpone urination Get up at night to urinate Leakage of urine Stream starts and stops Urinary tract infection   Review of Systems  Gastrointestinal (upper)  : Nausea Indigestion/heartburn  Gastrointestinal (lower) : Diarrhea  Constitutional : Fatigue  Skin: Skin rash/lesion Itching  Eyes: Negative for eye symptoms  Ear/Nose/Throat : Sinus problems  Hematologic/Lymphatic: Easy bruising  Cardiovascular : Leg swelling Chest pain  Respiratory : Shortness of breath  Endocrine: Excessive thirst  Musculoskeletal: Back pain Joint pain  Neurological: Headaches Dizziness  Psychologic: Depression Anxiety

## 2021-07-09 ENCOUNTER — Other Ambulatory Visit (HOSPITAL_COMMUNITY): Payer: Self-pay | Admitting: Neurology

## 2021-07-09 ENCOUNTER — Other Ambulatory Visit: Payer: Self-pay | Admitting: Neurology

## 2021-07-09 DIAGNOSIS — M5416 Radiculopathy, lumbar region: Secondary | ICD-10-CM

## 2021-07-16 ENCOUNTER — Ambulatory Visit (INDEPENDENT_AMBULATORY_CARE_PROVIDER_SITE_OTHER): Payer: 59 | Admitting: Urology

## 2021-07-16 ENCOUNTER — Encounter: Payer: Self-pay | Admitting: Urology

## 2021-07-16 ENCOUNTER — Other Ambulatory Visit: Payer: Self-pay

## 2021-07-16 VITALS — BP 163/78 | HR 73 | Wt 163.0 lb

## 2021-07-16 DIAGNOSIS — N3946 Mixed incontinence: Secondary | ICD-10-CM | POA: Diagnosis not present

## 2021-07-16 DIAGNOSIS — R8290 Unspecified abnormal findings in urine: Secondary | ICD-10-CM | POA: Diagnosis not present

## 2021-07-16 DIAGNOSIS — Z87440 Personal history of urinary (tract) infections: Secondary | ICD-10-CM | POA: Diagnosis not present

## 2021-07-16 LAB — MICROSCOPIC EXAMINATION
Epithelial Cells (non renal): >10 /hpf — AB (ref 0–10)
RBC, Urine: NONE SEEN /hpf (ref 0–2)
Renal Epithel, UA: NONE SEEN /hpf

## 2021-07-16 LAB — URINALYSIS, ROUTINE W REFLEX MICROSCOPIC
Bilirubin, UA: NEGATIVE
Glucose, UA: NEGATIVE
Ketones, UA: NEGATIVE
Nitrite, UA: NEGATIVE
RBC, UA: NEGATIVE
Specific Gravity, UA: 1.015 (ref 1.005–1.030)
Urobilinogen, Ur: 0.2 mg/dL (ref 0.2–1.0)
pH, UA: 6.5 (ref 5.0–7.5)

## 2021-07-16 MED ORDER — GEMTESA 75 MG PO TABS: 75.0000 mg | ORAL_TABLET | Freq: Every day | ORAL | 11 refills | Status: DC

## 2021-07-16 NOTE — Progress Notes (Signed)
Urological Symptom Review  Patient is experiencing the following symptoms: Frequent urination Get up at night to urinate Leakage of urine Stream starts and stops   Review of Systems  Gastrointestinal (upper)  : Indigestion/heartburn  Gastrointestinal (lower) : Negative for lower GI symptoms  Constitutional : Night Sweats Fatigue  Skin: Negative for skin symptoms  Eyes: Negative for eye symptoms  Ear/Nose/Throat : Negative for Ear/Nose/Throat symptoms  Hematologic/Lymphatic: Negative for Hematologic/Lymphatic symptoms  Cardiovascular : Negative for cardiovascular symptoms  Respiratory : Negative for respiratory symptoms  Endocrine: Negative for endocrine symptoms  Musculoskeletal: Negative for musculoskeletal symptoms  Neurological: Headaches Dizziness  Psychologic: Anxiety

## 2021-07-16 NOTE — Progress Notes (Signed)
Assessment: 1. Mixed incontinence   2. History of UTI   3. Abnormal urine findings      Plan: Continue Gemtesa 75 mg daily.  Samples and rx given. Urine culture sent today.  We will contact her with results. Return to office in 3 months  Chief Complaint:  Chief Complaint  Patient presents with   Urinary Incontinence    History of Present Illness:  [name] is a 71 y.o. year old female who is seen for further evaluation of lower abdominal and pelvic pain and recent UTI.  She presented to the emergency room on 06/01/2021 with back and lower abdominal pain.  She also reported blood in her urine.  She was evaluated with a CT scan which showed bilateral renal cysts, small bilateral vascular calcifications in both kidneys, no renal or ureteral calculi, and no evidence of obstruction.  Urine culture grew >100 K E. coli.  She was treated with cephalexin.  She continued to have some low back pain with radiation into the gluteal area, lower abdominal and pelvic pain, urinary frequency, urgency, nocturia 3-4 times, urge and stress incontinence.   At her initial visit on 06/13/21, she was not having any dysuria or gross hematuria.  U/A negative. Pelvic exam demonstrated no leakage with cough or valsalva and no obvious vaginal prolapse. She was given a trial of Gemtesa on 06/13/21.  She returns today for follow-up.    She reports improvement in her symptoms with the [facility].  She has decreased frequency, urgency, nocturia, and urge incontinence.  No side effects from the medication.  She continues to have significant back pain with sciatica and is scheduled for an MRI tomorrow. No dysuria or gross hematuria.   Portions of the above documentation were copied from a prior visit for review purposes only.  Past Medical History:  Past Medical History:  Diagnosis Date   Anxiety    Arthritis    Rhumatoid and Osteoarthritis   Carotid artery disease (HCC)    Dr. Trula Slade, 60-79% RICA, s/p  left CEA   Coronary atherosclerosis of native coronary artery    DES RCA/circumflex 4/10, LVEF 65   Depression    Essential hypertension, benign    Fibromyalgia    GERD (gastroesophageal reflux disease)    Hiatal hernia    Hyperlipidemia    Kidney cysts    NSTEMI (non-ST elevated myocardial infarction) ([place])    4/10   Renal artery stenosis (HCC)    Left renal artery stent 12/13   Restless leg syndrome    Spondylosis without myelopathy     Past Surgical History:  Past Surgical History:  Procedure Laterality Date   ABDOMINAL HYSTERECTOMY  1977   BREAST LUMPECTOMY     CAROTID ENDARTERECTOMY Left 02/28/2009   CHOLECYSTECTOMY  1991   COLONOSCOPY     COLONOSCOPY N/A 05/08/2017   Procedure: COLONOSCOPY;  Surgeon: Rogene Houston, MD;  [facility];  Service: Endoscopy;  Laterality: N/A;  12:00   ESOPHAGOGASTRODUODENOSCOPY N/A 04/07/2013   Procedure: ESOPHAGOGASTRODUODENOSCOPY (EGD);  Surgeon: Rogene Houston, MD;  [facility];  Service: Endoscopy;  Laterality: N/A;  250   ESOPHAGOGASTRODUODENOSCOPY N/A 02/02/2014   Procedure: ESOPHAGOGASTRODUODENOSCOPY (EGD);  Surgeon: Rogene Houston, MD;  [facility];  Service: Endoscopy;  Laterality: N/A;  120   EYE SURGERY     Catartact bilateral   JOINT REPLACEMENT  March 2013   Left knee   JOINT REPLACEMENT  2006   Left Knee  kidney stent     Left December 2013   LUMBAR DISC SURGERY     L5-S1   MULTIPLE TOOTH EXTRACTIONS  Jan. 2015   Neck Fusion  1996   C4 to C6   PERCUTANEOUS STENT INTERVENTION Left 06/15/2012   Procedure: PERCUTANEOUS STENT INTERVENTION;  Surgeon: Serafina Mitchell, MD;  Location: [facility] CATH LAB;  Service: Cardiovascular;  Laterality: Left;  renal   PERIPHERAL VASCULAR BALLOON ANGIOPLASTY  05/26/2017   Procedure: PERIPHERAL VASCULAR BALLOON ANGIOPLASTY;  Surgeon: Serafina Mitchell, MD;  Location: [place] [facility];  Service: Cardiovascular;;  Lt. Renal Angioplasty   POLYPECTOMY  05/08/2017    Procedure: POLYPECTOMY;  Surgeon: Rogene Houston, MD;  [facility];  Service: Endoscopy;;  colon    RENAL ANGIOGRAM N/A 06/15/2012   Procedure: RENAL ANGIOGRAM;  Surgeon: Serafina Mitchell, MD;  Location: [facility] CATH LAB;  Service: Cardiovascular;  Laterality: N/A;   RENAL ANGIOGRAPHY  05/26/2017   Procedure: RENAL ANGIOGRAPHY;  Surgeon: Serafina Mitchell, MD;  Location: [place] [facility];  Service: Cardiovascular;;   SPINE SURGERY  1994   Ruptured disc   TONSILECTOMY, ADENOIDECTOMY, BILATERAL MYRINGOTOMY AND TUBES  1969   TOTAL KNEE ARTHROPLASTY     Left   TUBAL LIGATION      Allergies:  Allergies  Allergen Reactions   Celecoxib Itching   Ciprofloxacin Hcl    Codeine Itching   Dicyclomine Diarrhea and Other (See Comments)    Made diarrhea worse   Lamictal [Lamotrigine] Other (See Comments)    Unknown   Metoclopramide Hcl Other (See Comments)    Dizziness    Morphine    Morphine And Related Other (See Comments)    Patient states she is not allergic to morphine; states had morphine with no reaction   Omeprazole    Savella [Milnacipran Hcl] Other (See Comments)    Unknown    Simvastatin     Increased back and muscle pain   Tape Other (See Comments)    Skin gets red and skin pulls off, paper tape only   Tizanidine    Trazodone And Nefazodone Other (See Comments)    hallucinations   Venlafaxine Other (See Comments)    Affected eyes, blurred vision   Citalopram Palpitations and Other (See Comments)    Heart rate increased   Sertraline Hcl Other (See Comments)    Stomach upset    Family History:  Family History  Problem Relation Age of Onset   Other Mother        Cerebral hemorrage   Heart disease Mother    Heart attack Mother    Hyperlipidemia Mother    Heart disease Father    Heart attack Father    Hyperlipidemia Father    Heart disease Brother        Heart Disease before age 68   Diabetes Brother    Hypertension Brother    Heart attack Brother     Cancer Sister    Arthritis Sister    Heart disease Sister    Heart disease Daughter 34       Heart Disease before age 73   Heart attack Daughter    Hypertension Daughter    Cancer Sister    Arthritis Sister    Diabetes Brother        Varicose  Veins   Peripheral vascular disease Brother    Diabetes Brother     Social History:  Social History   Tobacco Use  Smoking status: Former    Years: 45.00    Types: Cigarettes    Quit date: 10/2017    Years since quitting: 3.7   Smokeless tobacco: Never  Vaping Use   Vaping Use: Never used  Substance Use Topics   Alcohol use: No    Alcohol/week: 0.0 standard drinks   Drug use: No    ROS: Constitutional:  Negative for fever, chills, weight loss CV: Negative for chest pain, previous MI, hypertension Respiratory:  Negative for shortness of breath, wheezing, sleep apnea, frequent cough GI:  Negative for nausea, vomiting, bloody stool, GERD  Physical exam: BP (!) 163/78    Pulse 73    Wt 163 lb (73.9 kg)    BMI 32.92 kg/m  GENERAL APPEARANCE:  Well appearing, well developed, well nourished, NAD HEENT:  Atraumatic, normocephalic, oropharynx clear NECK:  Supple without lymphadenopathy or thyromegaly ABDOMEN:  Soft, non-tender, no masses EXTREMITIES:  Moves all extremities well, without clubbing, cyanosis, or edema NEUROLOGIC:  Alert and oriented x 3, normal gait, CN II-XII grossly intact MENTAL STATUS:  appropriate BACK:  Non-tender to palpation, No CVAT SKIN:  Warm, dry, and intact   Results: U/A:  11-30 WBC, 0-2 RBC, many bacteria, nitrite neg

## 2021-07-17 ENCOUNTER — Ambulatory Visit (HOSPITAL_COMMUNITY)
Admission: RE | Admit: 2021-07-17 | Discharge: 2021-07-17 | Disposition: A | Discharge status: Home / Self Care | Payer: 59 | Source: Ambulatory Visit | Attending: Neurology | Admitting: Neurology

## 2021-07-17 DIAGNOSIS — M5416 Radiculopathy, lumbar region: Secondary | ICD-10-CM | POA: Insufficient documentation

## 2021-07-22 ENCOUNTER — Telehealth: Payer: Self-pay

## 2021-07-22 LAB — URINE CULTURE

## 2021-07-22 MED ORDER — NITROFURANTOIN MONOHYD MACRO 100 MG PO CAPS: 100.0000 mg | ORAL_CAPSULE | Freq: Two times a day (BID) | ORAL | 0 refills | Status: AC

## 2021-07-22 NOTE — Telephone Encounter (Signed)
-----   Message from Primus Bravo, MD sent at 07/22/2021  3:50 PM EST ----- Please notify patient that her urine culture shows evidence of UTI.  I have sent in a prescription for Macrobid x10 days for treatment.

## 2021-07-22 NOTE — Telephone Encounter (Signed)
Patient called and made aware.

## 2021-07-22 NOTE — Addendum Note (Signed)
Addended by: Primus Bravo on: 07/22/2021 03:50 PM   Modules accepted: Orders

## 2021-08-19 ENCOUNTER — Ambulatory Visit (INDEPENDENT_AMBULATORY_CARE_PROVIDER_SITE_OTHER): Payer: 59 | Admitting: Cardiology

## 2021-08-19 ENCOUNTER — Encounter: Payer: Self-pay | Admitting: Cardiology

## 2021-08-19 ENCOUNTER — Other Ambulatory Visit: Payer: Self-pay

## 2021-08-19 VITALS — BP 112/52 | HR 78 | Ht 59.0 in | Wt 162.0 lb

## 2021-08-19 DIAGNOSIS — I25119 Atherosclerotic heart disease of native coronary artery with unspecified angina pectoris: Secondary | ICD-10-CM

## 2021-08-19 DIAGNOSIS — R0989 Other specified symptoms and signs involving the circulatory and respiratory systems: Secondary | ICD-10-CM | POA: Diagnosis not present

## 2021-08-19 DIAGNOSIS — R011 Cardiac murmur, unspecified: Secondary | ICD-10-CM

## 2021-08-19 DIAGNOSIS — I10 Essential (primary) hypertension: Secondary | ICD-10-CM | POA: Diagnosis not present

## 2021-08-19 NOTE — Progress Notes (Signed)
Cardiology Office Note  Date: 08/19/2021   ID: [name], DOB 16-Apr-1951, MRN [id number]  PCP:  Glenda Chroman, MD  Cardiologist:  Rozann Lesches, MD Electrophysiologist:  None   Chief Complaint  Patient presents with   Chest pain    History of Present Illness: [name] is a 71 y.o. female referred for cardiology consultation by Dr. Woody Seller for the evaluation of chest pain.  She states that over the last few months she has been experiencing episodes of dull chest discomfort and shortness of breath, sometimes with activity and at other times in the setting of emotional upset.  She has not taken any nitroglycerin as her current bottle is out of date.  She does use inhalers at times when this happens.  She was last seen back in 2015, history of CAD with DES to the RCA and circumflex in April 2010.  She has not undergone any recent cardiac structural or ischemic testing.  I personally reviewed her ECG today which shows sinus rhythm with nonspecific ST-T wave abnormalities.  We went over her medications which are outlined below.  Current cardiac regimen includes aspirin, Toprol-XL, Maxide, and Crestor.  Past Medical History:  Diagnosis Date   Anxiety    Arthritis    Rhumatoid and Osteoarthritis   Carotid artery disease (HCC)    Dr. Trula Slade, 60-79% RICA, s/p left CEA   Coronary atherosclerosis of native coronary artery    DES RCA/circumflex 4/10, LVEF 65   Depression    Essential hypertension, benign    Fibromyalgia    GERD (gastroesophageal reflux disease)    Hiatal hernia    Hyperlipidemia    Kidney cysts    NSTEMI (non-ST elevated myocardial infarction) ([place])    4/10   Renal artery stenosis (HCC)    Left renal artery stent 12/13   Restless leg syndrome    Spondylosis without myelopathy     Past Surgical History:  Procedure Laterality Date   ABDOMINAL HYSTERECTOMY  1977   BREAST LUMPECTOMY     CAROTID ENDARTERECTOMY Left 02/28/2009   CHOLECYSTECTOMY  1991    COLONOSCOPY     COLONOSCOPY N/A 05/08/2017   Procedure: COLONOSCOPY;  Surgeon: Rogene Houston, MD;  [facility];  Service: Endoscopy;  Laterality: N/A;  12:00   ESOPHAGOGASTRODUODENOSCOPY N/A 04/07/2013   Procedure: ESOPHAGOGASTRODUODENOSCOPY (EGD);  Surgeon: Rogene Houston, MD;  [facility];  Service: Endoscopy;  Laterality: N/A;  250   ESOPHAGOGASTRODUODENOSCOPY N/A 02/02/2014   Procedure: ESOPHAGOGASTRODUODENOSCOPY (EGD);  Surgeon: Rogene Houston, MD;  [facility];  Service: Endoscopy;  Laterality: N/A;  120   EYE SURGERY     Catartact bilateral   JOINT REPLACEMENT  March 2013   Left knee   JOINT REPLACEMENT  2006   Left Knee   kidney stent     Left December 2013   LUMBAR DISC SURGERY     L5-S1   MULTIPLE TOOTH EXTRACTIONS  Jan. 2015   Neck Fusion  1996   C4 to C6   PERCUTANEOUS STENT INTERVENTION Left 06/15/2012   Procedure: PERCUTANEOUS STENT INTERVENTION;  Surgeon: Serafina Mitchell, MD;  Location: [facility] CATH LAB;  Service: Cardiovascular;  Laterality: Left;  renal   PERIPHERAL VASCULAR BALLOON ANGIOPLASTY  05/26/2017   Procedure: PERIPHERAL VASCULAR BALLOON ANGIOPLASTY;  Surgeon: Serafina Mitchell, MD;  Location: [facility] CV [facility];  Service: Cardiovascular;;  Lt. Renal Angioplasty   POLYPECTOMY  05/08/2017   Procedure: POLYPECTOMY;  Surgeon: Rogene Houston, MD;  [facility];  Service: Endoscopy;;  colon    RENAL ANGIOGRAM N/A 06/15/2012   Procedure: RENAL ANGIOGRAM;  Surgeon: Serafina Mitchell, MD;  Location: [facility] CATH LAB;  Service: Cardiovascular;  Laterality: N/A;   RENAL ANGIOGRAPHY  05/26/2017   Procedure: RENAL ANGIOGRAPHY;  Surgeon: Serafina Mitchell, MD;  Location: [facility];  Service: Cardiovascular;;   SPINE SURGERY  1994   Ruptured disc   TONSILECTOMY, ADENOIDECTOMY, BILATERAL MYRINGOTOMY AND TUBES  1969   TOTAL KNEE ARTHROPLASTY     Left   TUBAL LIGATION      Current Outpatient Medications  Medication Sig  Dispense Refill   albuterol (VENTOLIN HFA) 108 (90 Base) MCG/ACT inhaler 1 puff 4 (four) times daily as needed for wheezing or shortness of breath.     aspirin 81 MG EC tablet Take by mouth.     Camphor-Menthol-Methyl Sal (SALONPAS) 3.07-19-08 % PTCH Apply 1 patch topically daily as needed.     DAYVIGO 5 MG TABS Take 1 tablet by mouth at bedtime as needed.     diazepam (VALIUM) 5 MG tablet Take 5 mg by mouth 2 (two) times daily as needed.     diclofenac (VOLTAREN) 25 MG EC tablet Take by mouth.     diclofenac Sodium (VOLTAREN) 1 % GEL Apply 2 g topically 4 (four) times daily.     DULoxetine (CYMBALTA) 20 MG capsule Take 20 mg by mouth daily.     EPINEPHrine (EPIPEN JR) 0.15 MG/0.3ML injection Inject into the muscle.     fluticasone (FLONASE) 50 MCG/ACT nasal spray Place 2 sprays into both nostrils daily as needed for allergies.      HYDROcodone-acetaminophen (NORCO/VICODIN) 5-325 MG tablet Take 1 tablet by mouth every 6 (six) hours as needed for moderate pain. 12 tablet 0   levothyroxine (SYNTHROID) 25 MCG tablet Take 25 mcg by mouth daily.     meloxicam (MOBIC) 15 MG tablet Take by mouth.     metoprolol succinate (TOPROL-XL) 25 MG 24 hr tablet Take 25 mg by mouth 2 (two) times daily.     Multiple Vitamins-Minerals (ALIVE WOMENS GUMMY PO) Take 1 Dose by mouth daily.     nitroGLYCERIN (NITROSTAT) 0.4 MG SL tablet Place 0.4 mg under the tongue every 5 (five) minutes as needed for chest pain.      omeprazole (PRILOSEC) 10 MG capsule Take by mouth.     omeprazole (PRILOSEC) 20 MG capsule Take 20 mg by mouth daily.     ondansetron (ZOFRAN) 4 MG tablet Take 4 mg by mouth 3 (three) times daily as needed.     oxyCODONE-acetaminophen (PERCOCET/ROXICET) 5-325 MG tablet Take 1 tablet by mouth 3 (three) times daily as needed.     potassium gluconate 595 (99 K) MG TABS tablet Take 595 mg by mouth daily.     predniSONE (DELTASONE) 10 MG tablet Take by mouth.     prochlorperazine (COMPAZINE) 25 MG suppository  Place 1 suppository (25 mg total) rectally every 12 (twelve) hours as needed for nausea or vomiting. 6 suppository 0   promethazine (PHENERGAN) 25 MG tablet Take 25 mg by mouth 4 (four) times daily as needed.     rosuvastatin (CRESTOR) 10 MG tablet Take 10 mg by mouth at bedtime.     sertraline (ZOLOFT) 50 MG tablet Take 50 mg by mouth daily.     triamterene-hydrochlorothiazide (MAXZIDE-25) 37.5-25 MG per tablet Take 0.5 tablets by mouth every morning.  trimethoprim-polymyxin b (POLYTRIM) ophthalmic solution Place 1 drop into the right eye every 4 (four) hours.     Vibegron (GEMTESA) 75 MG TABS Take 75 mg by mouth daily. 30 tablet 11   vitamin B-12 (CYANOCOBALAMIN) 1000 MCG tablet Take 2 tablets (2,000 mcg total) by mouth daily. (Patient taking differently: Take 1,000 mcg by mouth daily.) 60 tablet 0   Vitamin D, Ergocalciferol, (DRISDOL) 50000 UNITS CAPS Take 50,000 Units by mouth every Monday.      zinc gluconate 50 MG tablet Take 50 mg by mouth daily.     No current facility-administered medications for this visit.   Allergies:  Celecoxib, Ciprofloxacin hcl, Codeine, Dicyclomine, Lamictal [lamotrigine], Metoclopramide hcl, Morphine, Morphine and related, Omeprazole, Savella [milnacipran hcl], Simvastatin, Tape, Tizanidine, Trazodone and nefazodone, Venlafaxine, Citalopram, and Sertraline hcl   Social History: The patient  reports that she quit smoking about 3 years ago. Her smoking use included cigarettes. She has never used smokeless tobacco. She reports that she does not drink alcohol and does not use drugs.   Family History: The patient's family history includes Arthritis in her sister and sister; Cancer in her sister and sister; Diabetes in her brother, brother, and brother; Heart attack in her brother, daughter, father, and mother; Heart disease in her brother, father, mother, and sister; Heart disease (age of onset: 33) in her daughter; Hyperlipidemia in her father and mother;  Hypertension in her brother and daughter; Other in her mother; Peripheral vascular disease in her brother.   ROS: No palpitations or syncope.  Physical Exam: VS:  BP (!) 112/52 (BP Location: Left Arm)    Pulse 78    Ht 4\' 11"  (1.499 m)    Wt 162 lb (73.5 kg)    SpO2 96%    BMI 32.72 kg/m , BMI Body mass index is 32.72 kg/m.  Wt Readings from Last 3 Encounters:  08/19/21 162 lb (73.5 kg)  07/16/21 163 lb (73.9 kg)  06/01/21 163 lb (73.9 kg)    General: Patient appears comfortable at rest. HEENT: Conjunctiva and lids normal, wearing a mask. Neck: Supple, no elevated JVP, bilateral carotid bruits right greater than left, no thyromegaly. Lungs: Clear to auscultation, nonlabored breathing at rest. Cardiac: Regular rate and rhythm, no S3, 2/6 basal systolic murmur, no pericardial rub. Abdomen: Soft, nontender, bowel sounds present. Extremities: No pitting edema, distal pulses 2+. Skin: Warm and dry. Musculoskeletal: No kyphosis. Neuropsychiatric: Alert and oriented x3, affect grossly appropriate.  ECG:  An ECG dated 03/10/2020 was personally reviewed today and demonstrated:  Sinus rhythm with diffuse nonspecific ST-T wave abnormalities.  Recent Labwork: 06/01/2021: ALT 14; AST 20; BUN 22; Creatinine, Ser 1.27; Hemoglobin 14.9; Platelets 181; Potassium 3.8; Sodium 134   Other Studies Reviewed Today:  PCI 10/25/2008:  PROCEDURE PERFORMED:  1. Percutaneous coronary intervention with placement of Xience drug-      eluting stent in the mid circumflex coronary artery.  2. Percutaneous coronary intervention with placement of a Xience drug-      eluting stent in the mid right coronary artery.  3. Percutaneous coronary intervention with placement of 2 Xience drug-      eluting stents in the proximal and ostial right coronary artery.    OPERATOR:  Lauree Chandler, MD    PROCEDURE IN DETAIL:  The patient's diagnostic cath was performed this  morning by Dr. Minus Breeding.  The patient  was found to have severely  obstructive lesions in the mid circumflex artery, the mid right coronary  artery,  and the ostium of the right coronary artery.  I was asked to  come in and perform the patient's coronary intervention.  The patient  had been consented for possible percutaneous coronary intervention prior  to her diagnostic catheterization.  Initially, she was given a bolus of  Angiomax and drip was started.  She was given 600 mg of Plavix on the  cath table.  An XB 3.0 guiding catheter was used to selectively engage  the left main coronary artery.  At this point, the ACT was greater than  200, and a cougar intracoronary wire was passed down the circumflex  artery beyond the area of tightest stenosis.  A 2.5 x 12 mm balloon was  initially inflated in the area of tightest stenosis.  A 2.5 x 15 mm  Xience drug-eluting stent was then deployed in the area of tightest  stenosis.  A 2.75 x 12 mm noncompliant balloon was then used for post-  dilatation of lesion.  The stenosis prior to the intervention was 80%,  following intervention was 0%.    I then turned my attention to the right coronary artery.  A JR-4 guiding  catheter with side holes was used to approach the ostium of the right  coronary artery.  When the catheter was placed just outside the ostium,  I advanced a cougar intracoronary wire down the right coronary artery  without difficulty.  I initially used 2.5 x 12 mm balloon and inflated  this in the ostium.  This balloon expanded well.  I then removed this  balloon and passed a 2.5 x 8 mm balloon into the mid right coronary  artery at the area of tight stenosis.  This balloon was inflated and  then removed.  A 2.75 x 8 mm Xience drug-eluting stent was deployed in  the mid right coronary artery.  The stenosis was taken from 75% to 0%.    At this point, I inserted a 3.0 x 12 mm Xience drug-eluting stent into  the proximal right coronary artery.  It was difficult to see  exactly  where the ostium was in the vessel.  The vessel did have some mild  calcification which made stent placement difficult.  The stent was  deployed in the area of the ostium of the right coronary artery,  however, with deployment, the stent did slide forward a bit.  The ostium  was missed initially with the stent.  A 3.0 x 8 mm Xience drug-eluting  stent was then deployed at the ostium in an overlapping fashion with the  other stent.  There were 1-2 mm of stent struts hanging out into the  aorta in an intentional manner.  The stent was deployed with an  excellent angiographic result.  A 3.25 x 20 mm noncompliant balloon was  then used to post-dilate the overlapping stents in the proximal and  ostial right coronary artery.  This stenosis prior to the intervention  was 90%, following the intervention was 0%.    IMPRESSION:  Successful percutaneous coronary intervention with  placement of drug-eluting stents in the mid circumflex artery, mid right  coronary artery, and the ostium of the right coronary artery.  Assessment and Plan:  1.  CAD with history of DES to the RCA and circumflex in April 2010.  She reports recurrent chest discomfort and shortness of breath concerning for angina.  Current medical regimen includes aspirin, Toprol-XL, and Crestor.  ECG is nonspecific, and her ECG over time has been chronically abnormal.  Plan is to obtain a Lexiscan Myoview to assess ischemic burden and determine next step from there.  2.  Systolic murmur, possibly sclerotic aortic valve.  She has not had a recent echocardiogram and this will be arranged.  3.  Carotid bruits, right side predominant.  She has a prior history of left CEA and moderate RICA stenosis.  Obtain carotid Dopplers.  4.  Essential hypertension, blood pressure is well controlled today.  She is on Toprol-XL and Maxide.  Medication Adjustments/Labs and Tests Ordered: Current medicines are reviewed at length with the patient  today.  Concerns regarding medicines are outlined above.   Tests Ordered: Orders Placed This Encounter  Procedures   NM Myocar Multi W/Spect W/Wall Motion / EF   US Carotid Duplex Bilateral   EKG 12-Lead   ECHOCARDIOGRAM COMPLETE    Medication Changes: No orders of the defined types were placed in this encounter.   Disposition:  Follow up  test results, reestablish regular follow-up.  Signed, Satira Sark, MD, [facility] 08/19/2021 1:45 PM    [place] [facility] [address], [place], [place] [address] Phone: [phone]; Fax: [phone]

## 2021-08-19 NOTE — Patient Instructions (Signed)
Medication Instructions:   Your physician recommends that you continue on your current medications as directed. Please refer to the Current Medication list given to you today.   Labwork: None today  Testing/Procedures: Your physician has requested that you have an echocardiogram. Echocardiography is a painless test that uses sound waves to create images of your heart. It provides your doctor with information about the size and shape of your heart and how well your hearts chambers and valves are working. This procedure takes approximately one hour. There are no restrictions for this procedure.   Your physician has requested that you have a lexiscan myoview. For further information please visit [email or web address]. Please follow instruction sheet, as given.   Your physician has requested that you have a carotid duplex. This test is an ultrasound of the carotid arteries in your neck. It looks at blood flow through these arteries that supply the brain with blood. Allow one hour for this exam. There are no restrictions or special instructions.    Follow-Up: 6 months  Any Other Special Instructions Will Be Listed Below (If Applicable).  If you need a refill on your cardiac medications before your next appointment, please call your pharmacy.

## 2021-08-28 ENCOUNTER — Ambulatory Visit (HOSPITAL_COMMUNITY)
Admission: RE | Admit: 2021-08-28 | Discharge: 2021-08-28 | Disposition: A | Discharge status: Home / Self Care | Payer: 59 | Source: Ambulatory Visit | Attending: Cardiology | Admitting: Cardiology

## 2021-08-28 ENCOUNTER — Encounter (HOSPITAL_COMMUNITY): Payer: Self-pay

## 2021-08-28 ENCOUNTER — Telehealth: Payer: Self-pay

## 2021-08-28 ENCOUNTER — Other Ambulatory Visit: Payer: Self-pay

## 2021-08-28 DIAGNOSIS — I25119 Atherosclerotic heart disease of native coronary artery with unspecified angina pectoris: Secondary | ICD-10-CM | POA: Insufficient documentation

## 2021-08-28 LAB — NM MYOCAR MULTI W/SPECT W/WALL MOTION / EF
LV dias vol: 47 mL (ref 46–106)
LV sys vol: 13 mL
Nuc Stress EF: 73 %
Peak HR: 83 {beats}/min
RATE: 0.4
Rest HR: 68 {beats}/min
Rest Nuclear Isotope Dose: 10.2 mCi
SDS: 3
SRS: 0
SSS: 3
ST Depression (mm): 0 mm
Stress Nuclear Isotope Dose: 29.8 mCi
TID: 1.22

## 2021-08-28 MED ORDER — ISOSORBIDE MONONITRATE ER 30 MG PO TB24: ORAL_TABLET | ORAL | 3 refills | Status: DC

## 2021-08-28 MED ORDER — REGADENOSON 0.4 MG/5ML IV SOLN
INTRAVENOUS | Status: AC
  Administered 2021-08-28: 0.4 mg via INTRAVENOUS
  Filled 2021-08-28: qty 5

## 2021-08-28 MED ORDER — TECHNETIUM TC 99M TETROFOSMIN IV KIT
30.0000 | PACK | Freq: Once | INTRAVENOUS | Status: AC | PRN
  Administered 2021-08-28: 29.8 via INTRAVENOUS

## 2021-08-28 MED ORDER — SODIUM CHLORIDE FLUSH 0.9 % IV SOLN
INTRAVENOUS | Status: AC
  Administered 2021-08-28: 10 mL via INTRAVENOUS
  Filled 2021-08-28: qty 10

## 2021-08-28 MED ORDER — TECHNETIUM TC 99M TETROFOSMIN IV KIT
10.0000 | PACK | Freq: Once | INTRAVENOUS | Status: AC | PRN
  Administered 2021-08-28: 10.2 via INTRAVENOUS

## 2021-08-28 NOTE — Telephone Encounter (Signed)
-----   Message from Satira Sark, MD sent at 08/28/2021  2:51 PM EST ----- Results reviewed.  I also reviewed the images.  She does have evidence of RCA and possibly circumflex distribution ischemia, LVEF is normal range.  This suggests progression in CAD since her last stent intervention.  Would start Imdur beginning at 15 mg daily and increasing to 30 mg daily, please schedule office follow-up within the next few months.  If symptoms escalate, we will need to pursue further evaluation most likely via cardiac catheterization.

## 2021-08-28 NOTE — Telephone Encounter (Signed)
Follow up apt made for 11/11/21 at 1:30 pm with M.Lenze, PA-C   I discussed results with patient. She will start Imdur 15 mg  daily for 1 week then progress to 30 mg daily if tolerated.    She will update after starting Imdur,copied pcp with results.

## 2021-09-19 ENCOUNTER — Other Ambulatory Visit: Payer: Self-pay

## 2021-09-19 ENCOUNTER — Ambulatory Visit (INDEPENDENT_AMBULATORY_CARE_PROVIDER_SITE_OTHER): Payer: 59

## 2021-09-19 ENCOUNTER — Ambulatory Visit: Payer: 59

## 2021-09-19 DIAGNOSIS — R011 Cardiac murmur, unspecified: Secondary | ICD-10-CM | POA: Diagnosis not present

## 2021-09-19 DIAGNOSIS — R0989 Other specified symptoms and signs involving the circulatory and respiratory systems: Secondary | ICD-10-CM

## 2021-09-20 ENCOUNTER — Telehealth: Payer: Self-pay

## 2021-09-20 ENCOUNTER — Telehealth: Payer: Self-pay | Admitting: Cardiology

## 2021-09-20 DIAGNOSIS — I6523 Occlusion and stenosis of bilateral carotid arteries: Secondary | ICD-10-CM

## 2021-09-20 LAB — ECHOCARDIOGRAM COMPLETE
AR max vel: 2.32 cm2
AV Area VTI: 2.51 cm2
AV Area mean vel: 2.12 cm2
AV Mean grad: 4 mmHg
AV Peak grad: 6.7 mmHg
Ao pk vel: 1.29 m/s
Area-P 1/2: 3.34 cm2
Calc EF: 64 %
S' Lateral: 2.44 cm
Single Plane A2C EF: 66.1 %
Single Plane A4C EF: 61.5 %

## 2021-09-20 NOTE — Telephone Encounter (Signed)
A user error has taken place: encounter opened in error, closed for administrative reasons.

## 2021-09-20 NOTE — Telephone Encounter (Signed)
Pt wanting to speak with nurse Tye Maryland... please advise ?

## 2021-09-20 NOTE — Telephone Encounter (Signed)
Results discussed with patient and referral placed with [facility].Copied pcp ?

## 2021-09-20 NOTE — Telephone Encounter (Signed)
-----   Message from Satira Sark, MD sent at 09/20/2021  8:27 AM EST ----- ?Results reviewed.  Carotid Dopplers suggest severe RICA stenosis, mild LICA stenosis.  Please get her a consultation soon with Dr. Donnetta Hutching for vascular evaluation. ?

## 2021-09-23 NOTE — Telephone Encounter (Signed)
I spoke with patient.She wanted to reiterate results of carotid US,awaits call from [facility] for apt.  ?

## 2021-10-09 ENCOUNTER — Other Ambulatory Visit: Payer: Self-pay

## 2021-10-09 ENCOUNTER — Encounter: Payer: Self-pay | Admitting: Vascular Surgery

## 2021-10-09 ENCOUNTER — Ambulatory Visit (INDEPENDENT_AMBULATORY_CARE_PROVIDER_SITE_OTHER): Payer: 59 | Admitting: Vascular Surgery

## 2021-10-09 VITALS — BP 136/70 | HR 68 | Ht 59.0 in | Wt 162.4 lb

## 2021-10-09 DIAGNOSIS — I6521 Occlusion and stenosis of right carotid artery: Secondary | ICD-10-CM

## 2021-10-09 NOTE — Progress Notes (Signed)
? ? ?[facility] ? ?Patient name: [name] MRN: [id number] DOB: 05/30/1951 Sex: female ? ?REASON FOR CONSULT: Evaluation carotid disease ? ?HPI: ?[name] is a 70 y.o. female, who is here today for evaluation of carotid disease.  She has a very complex history.  She does have a prior history of left carotid endarterectomy in the remote past.  Apparently this was a very extensive procedure and postoperative course.  She does have known right carotid stenosis and underwent CT angiogram in 2018 for evaluation of this.  I have reviewed these films and discussed them with the patient.  This showed predicted 75 to 80% stenosis and she was to be followed with serial ultrasound.  She had a recent duplex suggesting correct progression of disease.  She also has prior history of renal artery stenosis and underwent angioplasty with Dr. Trula Slade in 2013 and subsequent left renal artery angioplasty and stenting in 2018.  She specifically denies any focal neurologic deficits.  She does have some significant coronary artery disease and has had persistent angina.  She has had recent adjustment of her cardiac meds with hope of holding off on any cardiac intervention if her symptoms can be controlled with medical management.  She tells me today that she is having intolerance of her isosorbide. ? ?Past Medical History:  ?Diagnosis Date  ? Anxiety   ? Arthritis   ? Rhumatoid and Osteoarthritis  ? Carotid artery disease ([place])   ? Dr. Trula Slade, 60-79% RICA, s/p left CEA  ? Coronary atherosclerosis of native coronary artery   ? DES RCA/circumflex 4/10, LVEF 65  ? Depression   ? Essential hypertension, benign   ? Fibromyalgia   ? GERD (gastroesophageal reflux disease)   ? Hiatal hernia   ? Hyperlipidemia   ? Kidney cysts   ? NSTEMI (non-ST elevated myocardial infarction) ([place])   ? 4/10  ? Renal artery stenosis ([place])   ? Left renal artery stent 12/13  ? Restless leg  syndrome   ? Spondylosis without myelopathy   ? ? ?Family History  ?Problem Relation Age of Onset  ? Other Mother   ?     Cerebral hemorrage  ? Heart disease Mother   ? Heart attack Mother   ? Hyperlipidemia Mother   ? Heart disease Father   ? Heart attack Father   ? Hyperlipidemia Father   ? Heart disease Brother   ?     Heart Disease before age 57  ? Diabetes Brother   ? Hypertension Brother   ? Heart attack Brother   ? Cancer Sister   ? Arthritis Sister   ? Heart disease Sister   ? Heart disease Daughter 19  ?     Heart Disease before age 63  ? Heart attack Daughter   ? Hypertension Daughter   ? Cancer Sister   ? Arthritis Sister   ? Diabetes Brother   ?     Varicose  Veins  ? Peripheral vascular disease Brother   ? Diabetes Brother   ? ? ?SOCIAL HISTORY: ?Social History  ? ?Socioeconomic History  ? Marital status: Divorced  ?  Spouse name: Not on file  ? Number of children: Not on file  ? Years of education: Not on file  ? Highest education level: Not on file  ?Occupational History  ? Not on file  ?Tobacco Use  ? Smoking status: Every Day  ?  Years: 45.00  ?  Types: Cigarettes  ?  Last attempt to quit: 10/2017  ?  Years since quitting: 3.9  ? Smokeless tobacco: Never  ?Vaping Use  ? Vaping Use: Never used  ?Substance and Sexual Activity  ? Alcohol use: No  ?  Alcohol/week: 0.0 standard drinks  ? Drug use: No  ? Sexual activity: Not on file  ?Other Topics Concern  ? Not on file  ?Social History Narrative  ? Not on file  ? ?Social Determinants of Health  ? ?Financial Resource Strain: Not on file  ?Food Insecurity: Not on file  ?Transportation Needs: Not on file  ?Physical Activity: Not on file  ?Stress: Not on file  ?Social Connections: Not on file  ?Intimate Partner Violence: Not on file  ? ? ?Allergies  ?Allergen Reactions  ? Celecoxib Itching  ? Ciprofloxacin Hcl   ? Codeine Itching  ? Dicyclomine Diarrhea and Other (See Comments)  ?  Made diarrhea worse  ? Lamictal [Lamotrigine] Other (See Comments)  ?   Unknown  ? Metoclopramide Hcl Other (See Comments)  ?  Dizziness   ? Morphine   ? Morphine And Related Other (See Comments)  ?  Patient states she is not allergic to morphine; states had morphine with no reaction  ? Omeprazole   ? Savella [Milnacipran Hcl] Other (See Comments)  ?  Unknown   ? Simvastatin   ?  Increased back and muscle pain  ? Tape Other (See Comments)  ?  Skin gets red and skin pulls off, paper tape only  ? Tizanidine   ? Trazodone And Nefazodone Other (See Comments)  ?  hallucinations  ? Venlafaxine Other (See Comments)  ?  Affected eyes, blurred vision  ? Citalopram Palpitations and Other (See Comments)  ?  Heart rate increased  ? Sertraline Hcl Other (See Comments)  ?  Stomach upset  ? ? ?Current Outpatient Medications  ?Medication Sig Dispense Refill  ? albuterol (VENTOLIN HFA) 108 (90 Base) MCG/ACT inhaler 1 puff 4 (four) times daily as needed for wheezing or shortness of breath.    ? aspirin 81 MG EC tablet Take by mouth.    ? Camphor-Menthol-Methyl Sal (SALONPAS) 3.07-19-08 % PTCH Apply 1 patch topically daily as needed.    ? DAYVIGO 5 MG TABS Take 1 tablet by mouth at bedtime as needed.    ? diazepam (VALIUM) 5 MG tablet Take 5 mg by mouth 2 (two) times daily as needed.    ? diclofenac (VOLTAREN) 25 MG EC tablet Take by mouth.    ? diclofenac Sodium (VOLTAREN) 1 % GEL Apply 2 g topically 4 (four) times daily.    ? DULoxetine (CYMBALTA) 20 MG capsule Take 20 mg by mouth daily.    ? EPINEPHrine (EPIPEN JR) 0.15 MG/0.3ML injection Inject into the muscle.    ? fluticasone (FLONASE) 50 MCG/ACT nasal spray Place 2 sprays into both nostrils daily as needed for allergies.     ? levothyroxine (SYNTHROID) 25 MCG tablet Take 25 mcg by mouth daily.    ? metoprolol succinate (TOPROL-XL) 25 MG 24 hr tablet Take 25 mg by mouth 2 (two) times daily.    ? Multiple Vitamins-Minerals (ALIVE WOMENS GUMMY PO) Take 1 Dose by mouth daily.    ? nitroGLYCERIN (NITROSTAT) 0.4 MG SL tablet Place 0.4 mg under the tongue  every 5 (five) minutes as needed for chest pain.     ? omeprazole (PRILOSEC) 20 MG capsule Take 20 mg by mouth daily.    ? ondansetron (ZOFRAN) 4 MG tablet Take  4 mg by mouth 3 (three) times daily as needed.    ? oxyCODONE-acetaminophen (PERCOCET/ROXICET) 5-325 MG tablet Take 1 tablet by mouth 3 (three) times daily as needed.    ? potassium gluconate 595 (99 K) MG TABS tablet Take 595 mg by mouth daily.    ? rosuvastatin (CRESTOR) 10 MG tablet Take 10 mg by mouth at bedtime.    ? sertraline (ZOLOFT) 50 MG tablet Take 50 mg by mouth daily.    ? triamterene-hydrochlorothiazide (MAXZIDE-25) 37.5-25 MG per tablet Take 0.5 tablets by mouth every morning.     ? vitamin B-12 (CYANOCOBALAMIN) 1000 MCG tablet Take 2 tablets (2,000 mcg total) by mouth daily. (Patient taking differently: Take 1,000 mcg by mouth daily.) 60 tablet 0  ? Vitamin D, Ergocalciferol, (DRISDOL) 50000 UNITS CAPS Take 50,000 Units by mouth every Monday.     ? zinc gluconate 50 MG tablet Take 50 mg by mouth daily.    ? isosorbide mononitrate (IMDUR) 30 MG 24 hr tablet Take 1/2 tablet daily (15 mg) for 1 week. If tolerated, increase to 30 mg daily (Patient not taking: Reported on 10/09/2021) 90 tablet 3  ? trimethoprim-polymyxin b (POLYTRIM) ophthalmic solution Place 1 drop into the right eye every 4 (four) hours. (Patient not taking: Reported on 10/09/2021)    ? Vibegron (GEMTESA) 75 MG TABS Take 75 mg by mouth daily. (Patient not taking: Reported on 10/09/2021) 30 tablet 11  ? ?No current facility-administered medications for this visit.  ? ? ?REVIEW OF SYSTEMS:  ?'[X]'$  denotes positive finding, '[ ]'$  denotes negative finding ?Cardiac  Comments:  ?Chest pain or chest pressure: x   ?Shortness of breath upon exertion: x   ?Short of breath when lying flat:    ?Irregular heart rhythm:    ?    ?Vascular    ?Pain in calf, thigh, or hip brought on by ambulation:    ?Pain in feet at night that wakes you up from your sleep:     ?Blood clot in your veins:    ?Leg  swelling:     ?    ?Pulmonary    ?Oxygen at home:    ?Productive cough:     ?Wheezing:     ?    ?Neurologic    ?Sudden weakness in arms or legs:     ?Sudden numbness in arms or legs:     ?Sudden onset of difficulty s

## 2021-10-10 ENCOUNTER — Other Ambulatory Visit: Payer: Self-pay

## 2021-10-10 DIAGNOSIS — I6521 Occlusion and stenosis of right carotid artery: Secondary | ICD-10-CM

## 2021-10-11 ENCOUNTER — Telehealth: Payer: Self-pay

## 2021-10-11 NOTE — Telephone Encounter (Signed)
-----   Message from Cathlean Cower sent at 10/11/2021 11:24 AM EDT ----- ?Regarding: RE: CTA ?Called patient. No answer , Lvm for patient to call back.  ?----- Message ----- ?From: Kaleen Mask, LPN ?Sent: 10/10/2021   3:38 PM EDT ?To: April H Pait, Cathlean Cower, # ?Subject: CTA                                           ? ?Please schedule at [facility] prior to appt on 10/28/21.  Thanks.   ? ? ?

## 2021-10-14 ENCOUNTER — Encounter: Payer: Self-pay | Admitting: Urology

## 2021-10-14 ENCOUNTER — Ambulatory Visit (INDEPENDENT_AMBULATORY_CARE_PROVIDER_SITE_OTHER): Payer: 59 | Admitting: Urology

## 2021-10-14 VITALS — BP 181/79 | HR 79 | Ht 59.0 in | Wt 161.0 lb

## 2021-10-14 DIAGNOSIS — N3946 Mixed incontinence: Secondary | ICD-10-CM | POA: Diagnosis not present

## 2021-10-14 DIAGNOSIS — Z87440 Personal history of urinary (tract) infections: Secondary | ICD-10-CM | POA: Diagnosis not present

## 2021-10-14 LAB — MICROSCOPIC EXAMINATION: RBC, Urine: NONE SEEN /hpf (ref 0–2)

## 2021-10-14 LAB — URINALYSIS, ROUTINE W REFLEX MICROSCOPIC
Bilirubin, UA: NEGATIVE
Glucose, UA: NEGATIVE
Ketones, UA: NEGATIVE
Nitrite, UA: NEGATIVE
Protein,UA: NEGATIVE
RBC, UA: NEGATIVE
Specific Gravity, UA: 1.01 (ref 1.005–1.030)
Urobilinogen, Ur: 0.2 mg/dL (ref 0.2–1.0)
pH, UA: 6.5 (ref 5.0–7.5)

## 2021-10-14 NOTE — Progress Notes (Signed)
? ?Assessment: ?1. Mixed incontinence   ?2. History of UTI   ? ? ?Plan: ?Resume Gemtesa 73 mg daily.   ?Continue pelvic floor exercises ?Return to office in 3 months ? ?Chief Complaint:  ?Chief Complaint  ?Patient presents with  ? Urinary Incontinence  ? ? ?History of Present Illness: ? ?[name] is a 71 y.o. year old female who is seen for further evaluation of lower abdominal and pelvic pain and recent UTI.  She presented to the emergency room on 06/01/2021 with back and lower abdominal pain.  She also reported blood in her urine.  She was evaluated with a CT scan which showed bilateral renal cysts, small bilateral vascular calcifications in both kidneys, no renal or ureteral calculi, and no evidence of obstruction.  Urine culture grew >100 K E. coli.  She was treated with cephalexin.  She continued to have some low back pain with radiation into the gluteal area, lower abdominal and pelvic pain, urinary frequency, urgency, nocturia 3-4 times, urge and stress incontinence.   ?At her initial visit on 06/13/21, she was not having any dysuria or gross hematuria.  U/A negative. ?Pelvic exam demonstrated no leakage with cough or valsalva and no obvious vaginal prolapse. ?She was given a trial of Gemtesa on 06/13/21. ? ?At her visit in 1/23, she reported improvement in her symptoms with the [facility].  She had decreased frequency, urgency, nocturia, and urge incontinence.  No side effects from the medication.  She continued to have significant back pain with sciatica and was scheduled for an MRI. ?No dysuria or gross hematuria. ?Urine culture from 1/23 grew >100 K Enterococcus.  Treated with Macrobid. ? ?She returns today for follow-up.  She discontinued the [facility] as she did not feel like it was controlling her symptoms.  She reports urinary frequency, urgency, and incontinence.  She has noted increased incontinence with coughing, sneezing, and laughing.  She is wearing depends on a regular basis.  No dysuria or  gross hematuria. ? ?Portions of the above documentation were copied from a prior visit for review purposes only. ? ?Past Medical History:  ?Past Medical History:  ?Diagnosis Date  ? Anxiety   ? Arthritis   ? Rhumatoid and Osteoarthritis  ? Carotid artery disease ([place])   ? Dr. Trula Slade, 60-79% RICA, s/p left CEA  ? Coronary atherosclerosis of native coronary artery   ? DES RCA/circumflex 4/10, LVEF 65  ? Depression   ? Essential hypertension, benign   ? Fibromyalgia   ? GERD (gastroesophageal reflux disease)   ? Hiatal hernia   ? Hyperlipidemia   ? Kidney cysts   ? NSTEMI (non-ST elevated myocardial infarction) ([place])   ? 4/10  ? Renal artery stenosis ([place])   ? Left renal artery stent 12/13  ? Restless leg syndrome   ? Spondylosis without myelopathy   ? ? ?Past Surgical History:  ?Past Surgical History:  ?Procedure Laterality Date  ? ABDOMINAL HYSTERECTOMY  1977  ? BREAST LUMPECTOMY    ? CAROTID ENDARTERECTOMY Left 02/28/2009  ? CHOLECYSTECTOMY  1991  ? COLONOSCOPY    ? COLONOSCOPY N/A 05/08/2017  ? Procedure: COLONOSCOPY;  Surgeon: Rogene Houston, MD;  [facility];  Service: Endoscopy;  Laterality: N/A;  12:00  ? ESOPHAGOGASTRODUODENOSCOPY N/A 04/07/2013  ? Procedure: ESOPHAGOGASTRODUODENOSCOPY (EGD);  Surgeon: Rogene Houston, MD;  Location: [facility];  Service: Endoscopy;  Laterality: N/A;  250  ? ESOPHAGOGASTRODUODENOSCOPY N/A 02/02/2014  ? Procedure: ESOPHAGOGASTRODUODENOSCOPY (EGD);  Surgeon: Mechele Dawley  Laural Golden, MD;  Location: [facility];  Service: Endoscopy;  Laterality: N/A;  120  ? EYE SURGERY    ? Catartact bilateral  ? JOINT REPLACEMENT  March 2013  ? Left knee  ? JOINT REPLACEMENT  2006  ? Left Knee  ? kidney stent    ? Left December 2013  ? LUMBAR DISC SURGERY    ? L5-S1  ? MULTIPLE TOOTH EXTRACTIONS  Jan. 2015  ? Neck Fusion  1996  ? C4 to C6  ? PERCUTANEOUS STENT INTERVENTION Left 06/15/2012  ? Procedure: PERCUTANEOUS STENT INTERVENTION;  Surgeon: Serafina Mitchell, MD;  Location: [facility] CATH  LAB;  Service: Cardiovascular;  Laterality: Left;  renal  ? PERIPHERAL VASCULAR BALLOON ANGIOPLASTY  05/26/2017  ? Procedure: PERIPHERAL VASCULAR BALLOON ANGIOPLASTY;  Surgeon: Serafina Mitchell, MD;  Location: [facility] CV LAB;  Service: Cardiovascular;;  Lt. Renal Angioplasty  ? POLYPECTOMY  05/08/2017  ? Procedure: POLYPECTOMY;  Surgeon: Rogene Houston, MD;  Location: [facility];  Service: Endoscopy;;  colon ?  ? RENAL ANGIOGRAM N/A 06/15/2012  ? Procedure: RENAL ANGIOGRAM;  Surgeon: Serafina Mitchell, MD;  Location: [facility] CATH LAB;  Service: Cardiovascular;  Laterality: N/A;  ? RENAL ANGIOGRAPHY  05/26/2017  ? Procedure: RENAL ANGIOGRAPHY;  Surgeon: Serafina Mitchell, MD;  Location: [facility] CV LAB;  Service: Cardiovascular;;  ? Manuel Garcia  ? Ruptured disc  ? TONSILECTOMY, ADENOIDECTOMY, BILATERAL MYRINGOTOMY AND TUBES  1969  ? TOTAL KNEE ARTHROPLASTY    ? Left  ? TUBAL LIGATION    ? ? ?Allergies:  ?Allergies  ?Allergen Reactions  ? Celecoxib Itching  ? Ciprofloxacin Hcl   ? Codeine Itching  ? Dicyclomine Diarrhea and Other (See Comments)  ?  Made diarrhea worse  ? Lamictal [Lamotrigine] Other (See Comments)  ?  Unknown  ? Metoclopramide Hcl Other (See Comments)  ?  Dizziness   ? Morphine   ? Morphine And Related Other (See Comments)  ?  Patient states she is not allergic to morphine; states had morphine with no reaction  ? Omeprazole   ? Savella [Milnacipran Hcl] Other (See Comments)  ?  Unknown   ? Simvastatin   ?  Increased back and muscle pain  ? Tape Other (See Comments)  ?  Skin gets red and skin pulls off, paper tape only  ? Tizanidine   ? Trazodone And Nefazodone Other (See Comments)  ?  hallucinations  ? Venlafaxine Other (See Comments)  ?  Affected eyes, blurred vision  ? Citalopram Palpitations and Other (See Comments)  ?  Heart rate increased  ? Sertraline Hcl Other (See Comments)  ?  Stomach upset  ? ? ?Family History:  ?Family History  ?Problem Relation Age of Onset  ? Other Mother   ?      Cerebral hemorrage  ? Heart disease Mother   ? Heart attack Mother   ? Hyperlipidemia Mother   ? Heart disease Father   ? Heart attack Father   ? Hyperlipidemia Father   ? Heart disease Brother   ?     Heart Disease before age 6  ? Diabetes Brother   ? Hypertension Brother   ? Heart attack Brother   ? Cancer Sister   ? Arthritis Sister   ? Heart disease Sister   ? Heart disease Daughter 20  ?     Heart Disease before age 93  ? Heart attack Daughter   ? Hypertension Daughter   ?  Cancer Sister   ? Arthritis Sister   ? Diabetes Brother   ?     Varicose  Veins  ? Peripheral vascular disease Brother   ? Diabetes Brother   ? ? ?Social History:  ?Social History  ? ?Tobacco Use  ? Smoking status: Every Day  ?  Years: 45.00  ?  Types: Cigarettes  ?  Last attempt to quit: 10/2017  ?  Years since quitting: 4.0  ? Smokeless tobacco: Never  ?Vaping Use  ? Vaping Use: Never used  ?Substance Use Topics  ? Alcohol use: No  ?  Alcohol/week: 0.0 standard drinks  ? Drug use: No  ? ? ?ROS: ?Constitutional:  Negative for fever, chills, weight loss ?CV: Negative for chest pain, previous MI, hypertension ?Respiratory:  Negative for shortness of breath, wheezing, sleep apnea, frequent cough ?GI:  Negative for nausea, vomiting, bloody stool, GERD ? ?Physical exam: ?BP (!) 181/79   Pulse 79   Ht '4\' 11"'$  (1.499 m)   Wt 161 lb (73 kg)   BMI 32.52 kg/m?  ?GENERAL APPEARANCE:  Well appearing, well developed, well nourished, NAD ?HEENT:  Atraumatic, normocephalic, oropharynx clear ?NECK:  Supple without lymphadenopathy or thyromegaly ?ABDOMEN:  Soft, non-tender, no masses ?EXTREMITIES:  Moves all extremities well, without clubbing, cyanosis, or edema ?NEUROLOGIC:  Alert and oriented x 3, normal gait, CN II-XII grossly intact ?MENTAL STATUS:  appropriate ?BACK:  Non-tender to palpation, No CVAT ?SKIN:  Warm, dry, and intact ? ?Results: ?U/A: 0-5 WBC, few bacteria ? ?

## 2021-10-21 ENCOUNTER — Encounter (INDEPENDENT_AMBULATORY_CARE_PROVIDER_SITE_OTHER): Payer: Self-pay | Admitting: *Deleted

## 2021-10-21 ENCOUNTER — Ambulatory Visit (HOSPITAL_COMMUNITY)
Admission: RE | Admit: 2021-10-21 | Discharge: 2021-10-21 | Disposition: A | Discharge status: Home / Self Care | Payer: 59 | Source: Ambulatory Visit | Attending: Vascular Surgery | Admitting: Vascular Surgery

## 2021-10-21 ENCOUNTER — Encounter (HOSPITAL_COMMUNITY): Payer: Self-pay | Admitting: Radiology

## 2021-10-21 DIAGNOSIS — I6521 Occlusion and stenosis of right carotid artery: Secondary | ICD-10-CM | POA: Diagnosis present

## 2021-10-21 LAB — POCT I-STAT CREATININE: Creatinine, Ser: 1.3 mg/dL — ABNORMAL HIGH (ref 0.44–1.00)

## 2021-10-21 MED ORDER — IOHEXOL 350 MG/ML SOLN
100.0000 mL | Freq: Once | INTRAVENOUS | Status: AC | PRN
  Administered 2021-10-21: 50 mL via INTRAVENOUS

## 2021-10-28 ENCOUNTER — Ambulatory Visit (INDEPENDENT_AMBULATORY_CARE_PROVIDER_SITE_OTHER): Payer: 59 | Admitting: Surgery

## 2021-10-28 ENCOUNTER — Encounter: Payer: Self-pay | Admitting: Surgery

## 2021-10-28 VITALS — BP 147/74 | HR 77 | Temp 98.8°F | Resp 20 | Ht 59.0 in | Wt 162.0 lb

## 2021-10-28 DIAGNOSIS — I6523 Occlusion and stenosis of bilateral carotid arteries: Secondary | ICD-10-CM

## 2021-10-28 NOTE — Progress Notes (Signed)
? ?[facility] [place] [facility] ? ?Patient name: [name] MRN: [id number] DOB: 08/03/1950 Sex: female ? ? ?REASON FOR VISIT:  ? ? ?Follow up ? ?HISOTRY OF PRESENT ILLNESS:  ? ? ?[name] is a 71 y.o. female who I performed an extensive left carotid endarterectomy in 2010 for symptomatic disease.  The plaque extended into the proximal common carotid artery.  I was unable to get to the proximal portion of the disease and has contacted plaque down ? ? ?I performed left renal artery stenting for hypertension on multiple medications on 06/15/2012.  A 5 mm stent was placed.  She had a recurrent stenosis treated with balloon angioplasty on 05/26/2017.  She is a former smoker.  He takes a statin for hypercholesterolemia.  She has a history of coronary disease status post MI.  She has persistent angina.  She is also complaining of constant abdominal pain and knee pain ? ?PAST MEDICAL HISTORY:  ? ?Past Medical History:  ?Diagnosis Date  ? Anxiety   ? Arthritis   ? Rhumatoid and Osteoarthritis  ? Carotid artery disease ([place])   ? Dr. Trula Slade, 60-79% RICA, s/p left CEA  ? Coronary atherosclerosis of native coronary artery   ? DES RCA/circumflex 4/10, LVEF 65  ? Depression   ? Essential hypertension, benign   ? Fibromyalgia   ? GERD (gastroesophageal reflux disease)   ? Hiatal hernia   ? Hyperlipidemia   ? Kidney cysts   ? NSTEMI (non-ST elevated myocardial infarction) ([place])   ? 4/10  ? Renal artery stenosis ([place])   ? Left renal artery stent 12/13  ? Restless leg syndrome   ? Spondylosis without myelopathy   ? ? ? ?FAMILY HISTORY:  ? ?Family History  ?Problem Relation Age of Onset  ? Other Mother   ?     Cerebral hemorrage  ? Heart disease Mother   ? Heart attack Mother   ? Hyperlipidemia Mother   ? Heart disease Father   ? Heart attack Father   ? Hyperlipidemia Father   ? Heart disease Brother   ?     Heart Disease before age 38  ? Diabetes Brother   ? Hypertension  Brother   ? Heart attack Brother   ? Cancer Sister   ? Arthritis Sister   ? Heart disease Sister   ? Heart disease Daughter 19  ?     Heart Disease before age 27  ? Heart attack Daughter   ? Hypertension Daughter   ? Cancer Sister   ? Arthritis Sister   ? Diabetes Brother   ?     Varicose  Veins  ? Peripheral vascular disease Brother   ? Diabetes Brother   ? ? ?SOCIAL HISTORY:  ? ?Social History  ? ?Tobacco Use  ? Smoking status: Every Day  ?  Years: 45.00  ?  Types: Cigarettes  ? Smokeless tobacco: Never  ?Substance Use Topics  ? Alcohol use: No  ?  Alcohol/week: 0.0 standard drinks  ? ? ? ?ALLERGIES:  ? ?Allergies  ?Allergen Reactions  ? Celecoxib Itching  ? Ciprofloxacin Hcl   ? Codeine Itching  ? Dicyclomine Diarrhea and Other (See Comments)  ?  Made diarrhea worse  ? Lamictal [Lamotrigine] Other (See Comments)  ?  Unknown  ? Metoclopramide Hcl Other (See Comments)  ?  Dizziness   ? Morphine   ? Morphine And Related Other (See Comments)  ?  Patient states she is not allergic  to morphine; states had morphine with no reaction  ? Omeprazole   ? Savella [Milnacipran Hcl] Other (See Comments)  ?  Unknown   ? Simvastatin   ?  Increased back and muscle pain  ? Tape Other (See Comments)  ?  Skin gets red and skin pulls off, paper tape only  ? Tizanidine   ? Trazodone And Nefazodone Other (See Comments)  ?  hallucinations  ? Venlafaxine Other (See Comments)  ?  Affected eyes, blurred vision  ? Citalopram Palpitations and Other (See Comments)  ?  Heart rate increased  ? Sertraline Hcl Other (See Comments)  ?  Stomach upset  ? ? ? ?CURRENT MEDICATIONS:  ? ?Current Outpatient Medications  ?Medication Sig Dispense Refill  ? albuterol (VENTOLIN HFA) 108 (90 Base) MCG/ACT inhaler 1 puff 4 (four) times daily as needed for wheezing or shortness of breath.    ? aspirin 81 MG EC tablet Take by mouth.    ? Camphor-Menthol-Methyl Sal (SALONPAS) 3.07-19-08 % PTCH Apply 1 patch topically daily as needed.    ? DAYVIGO 5 MG TABS Take 1  tablet by mouth at bedtime as needed.    ? diazepam (VALIUM) 5 MG tablet Take 5 mg by mouth 2 (two) times daily as needed.    ? diclofenac (VOLTAREN) 25 MG EC tablet Take by mouth.    ? diclofenac Sodium (VOLTAREN) 1 % GEL Apply 2 g topically 4 (four) times daily.    ? DULoxetine (CYMBALTA) 20 MG capsule Take 20 mg by mouth daily.    ? EPINEPHrine (EPIPEN JR) 0.15 MG/0.3ML injection Inject into the muscle.    ? fluticasone (FLONASE) 50 MCG/ACT nasal spray Place 2 sprays into both nostrils daily as needed for allergies.     ? isosorbide mononitrate (IMDUR) 30 MG 24 hr tablet Take 1/2 tablet daily (15 mg) for 1 week. If tolerated, increase to 30 mg daily 90 tablet 3  ? levothyroxine (SYNTHROID) 25 MCG tablet Take 25 mcg by mouth daily.    ? metoprolol succinate (TOPROL-XL) 25 MG 24 hr tablet Take 25 mg by mouth 2 (two) times daily.    ? Multiple Vitamins-Minerals (ALIVE WOMENS GUMMY PO) Take 1 Dose by mouth daily.    ? nitroGLYCERIN (NITROSTAT) 0.4 MG SL tablet Place 0.4 mg under the tongue every 5 (five) minutes as needed for chest pain.     ? omeprazole (PRILOSEC) 20 MG capsule Take 20 mg by mouth daily.    ? ondansetron (ZOFRAN) 4 MG tablet Take 4 mg by mouth 3 (three) times daily as needed.    ? oxyCODONE-acetaminophen (PERCOCET/ROXICET) 5-325 MG tablet Take 1 tablet by mouth 3 (three) times daily as needed.    ? potassium gluconate 595 (99 K) MG TABS tablet Take 595 mg by mouth daily.    ? rosuvastatin (CRESTOR) 10 MG tablet Take 10 mg by mouth at bedtime.    ? sertraline (ZOLOFT) 50 MG tablet Take 50 mg by mouth daily.    ? triamterene-hydrochlorothiazide (MAXZIDE-25) 37.5-25 MG per tablet Take 0.5 tablets by mouth every morning.     ? trimethoprim-polymyxin b (POLYTRIM) ophthalmic solution Place 1 drop into the right eye every 4 (four) hours.    ? Vibegron (GEMTESA) 75 MG TABS Take 75 mg by mouth daily. 30 tablet 11  ? vitamin B-12 (CYANOCOBALAMIN) 1000 MCG tablet Take 2 tablets (2,000 mcg total) by mouth  daily. (Patient taking differently: Take 1,000 mcg by mouth daily.) 60 tablet 0  ? Vitamin  D, Ergocalciferol, (DRISDOL) 50000 UNITS CAPS Take 50,000 Units by mouth every Monday.     ? zinc gluconate 50 MG tablet Take 50 mg by mouth daily.    ? ?No current facility-administered medications for this visit.  ? ? ?REVIEW OF SYSTEMS:  ? ?X denotes positive finding, denotes negative finding ?Cardiac  Comments:  ?Chest pain or chest pressure:    ?Shortness of breath upon exertion:    ?Short of breath when lying flat:    ?Irregular heart rhythm:    ?    ?Vascular    ?Pain in calf, thigh, or hip brought on by ambulation:    ?Pain in feet at night that wakes you up from your sleep:     ?Blood clot in your veins:    ?Leg swelling:     ?    ?Pulmonary    ?Oxygen at home:    ?Productive cough:     ?Wheezing:     ?    ?Neurologic    ?Sudden weakness in arms or legs:     ?Sudden numbness in arms or legs:     ?Sudden onset of difficulty speaking or slurred speech:    ?Temporary loss of vision in one eye:     ?Problems with dizziness:     ?    ?Gastrointestinal    ?Blood in stool:     ?Vomited blood:     ?    ?Genitourinary    ?Burning when urinating:     ?Blood in urine:    ?    ?Psychiatric    ?Major depression:     ?    ?Hematologic    ?Bleeding problems:    ?Problems with blood clotting too easily:    ?    ?Skin    ?Rashes or ulcers:    ?    ?Constitutional    ?Fever or chills:    ? ? ?PHYSICAL EXAM:  ? ?Vitals:  ? 10/28/21 1213 10/28/21 1214  ?BP: (!) 152/75 (!) 147/74  ?Pulse: 77   ?Resp: 20   ?Temp: 98.8 ?F (37.1 ?C)   ?SpO2: 93%   ?Weight: 162 lb (73.5 kg)   ?Height: '4\' 11"'$  (1.499 m)   ? ? ?GENERAL: The patient is a well-nourished female, in no acute distress. The vital signs are documented above. ?CARDIAC: There is a regular rate and rhythm.  ?PULMONARY: Non-labored respirations ?ABDOMEN: Soft but tender to palpation ?MUSCULOSKELETAL: There are no major deformities or cyanosis. ?NEUROLOGIC: No focal weakness or paresthesias  are detected. ?SKIN: There are no ulcers or rashes noted. ?PSYCHIATRIC: The patient has a normal affect. ? ?STUDIES:  ? ?I have reviewed her CT angiogram of the neck with the following findings: ? ?1. Extensive a

## 2021-11-04 NOTE — H&P (View-Only) (Signed)
? ?Cardiology Office Note   ? ?Date:  11/11/2021  ? ?ID:  [name], DOB [birth year], MRN [id number] ? ? ?PCP:  Glenda Chroman, MD ?  ?[place]  ?Cardiologist:  Rozann Lesches, MD   ?Advanced Practice Provider:  No care team member to display ?Electrophysiologist:  None  ? ?[id number]}  ? ?Chief Complaint  ?Patient presents with  ? Chest Pain  ? ? ?History of Present Illness:  ?[name] is a 71 y.o. female with history of CAD with DES to the RCA and circumflex in April 2010.   ? ?Patient seen 08/19/21 with chest pain and shortness of beath worrisome for angina. Lexiscan  which showed ischemia in RCA and possibly Cfx area. Imdur started. If ongoing symptoms will require cath. Echo for murmur-normal LVEF and aortic valve calcification. Also has asymptomatic bilateral carotid stenosis and will need RCEA and then left carotid redo endarterectomy. ? ?Patient comes in for f/u. Imdur gave her a severe headache so she can only tolerate 15 mg daily. When she vacuums or takes her trash out she gets chest tightness and shortness of breath eases when she stops after 3 min. Occurs daily and not getting better.  ? ?Past Medical History:  ?Diagnosis Date  ? Anxiety   ? Arthritis   ? Rhumatoid and Osteoarthritis  ? Carotid artery disease ([place])   ? Dr. Trula Slade, 60-79% RICA, s/p left CEA  ? Coronary atherosclerosis of native coronary artery   ? DES RCA/circumflex 4/10, LVEF 65  ? Depression   ? Essential hypertension, benign   ? Fibromyalgia   ? GERD (gastroesophageal reflux disease)   ? Hiatal hernia   ? Hyperlipidemia   ? Kidney cysts   ? NSTEMI (non-ST elevated myocardial infarction) ([place])   ? 4/10  ? Renal artery stenosis ([place])   ? Left renal artery stent 12/13  ? Restless leg syndrome   ? Spondylosis without myelopathy   ? ? ?Past Surgical History:  ?Procedure Laterality Date  ? ABDOMINAL HYSTERECTOMY  1977  ? BREAST LUMPECTOMY    ? CAROTID ENDARTERECTOMY Left 02/28/2009  ? CHOLECYSTECTOMY  1991  ?  COLONOSCOPY    ? COLONOSCOPY N/A 05/08/2017  ? Procedure: COLONOSCOPY;  Surgeon: Rogene [place], MD;  [facility];  Service: Endoscopy;  Laterality: N/A;  12:00  ? ESOPHAGOGASTRODUODENOSCOPY N/A 04/07/2013  ? Procedure: ESOPHAGOGASTRODUODENOSCOPY (EGD);  Surgeon: Rogene [place], MD;  Location: [facility];  Service: Endoscopy;  Laterality: N/A;  250  ? ESOPHAGOGASTRODUODENOSCOPY N/A 02/02/2014  ? Procedure: ESOPHAGOGASTRODUODENOSCOPY (EGD);  Surgeon: Rogene [place], MD;  [facility]: [facility];  Service: Endoscopy;  Laterality: N/A;  120  ? EYE SURGERY    ? Catartact bilateral  ? JOINT REPLACEMENT  March 2013  ? Left knee  ? JOINT REPLACEMENT  2006  ? Left Knee  ? kidney stent    ? Left December 2013  ? LUMBAR DISC SURGERY    ? L5-S1  ? MULTIPLE TOOTH EXTRACTIONS  Jan. 2015  ? Neck Fusion  1996  ? C4 to C6  ? PERCUTANEOUS STENT INTERVENTION Left 06/15/2012  ? Procedure: PERCUTANEOUS STENT INTERVENTION;  Surgeon: Serafina Mitchell, MD;  Location: [facility] C[facility] LAB;  Service: Cardiovascular;  Laterality: Left;  renal  ? PERIPHERAL VASCULAR BALLOON ANGIOPLASTY  05/26/2017  ? Procedure: PERIPHERAL VASCULAR BALLOON ANGIOPLASTY;  Surgeon: Serafina Mitchell, MD;  Location: [facility] LAB;  Service: Cardiovascular;;  Lt. Renal Angioplasty  ?  POLYPECTOMY  05/08/2017  ? Procedure: POLYPECTOMY;  Surgeon: Rogene [place], MD;  Location: [facility];  Service: Endoscopy;;  colon ?  ? RENAL ANGIOGRAM N/A 06/15/2012  ? Procedure: RENAL ANGIOGRAM;  Surgeon: Serafina Mitchell, MD;  Location: [facility] CATH LAB;  Service: Cardiovascular;  Laterality: N/A;  ? RENAL ANGIOGRAPHY  05/26/2017  ? Procedure: RENAL ANGIOGRAPHY;  Surgeon: Serafina Mitchell, MD;  Location: [place] [facility] LAB;  Service: Cardiovascular;;  ? [place]  ? Ruptured disc  ? TONSILECTOMY, ADENOIDECTOMY, BILATERAL MYRINGOTOMY AND TUBES  1969  ? TOTAL KNEE ARTHROPLASTY    ? Left  ? TUBAL LIGATION    ? ? ?Current Medications: ?Current Meds  ?Medication Sig   ? albuterol (VENTOLIN HFA) 108 (90 Base) MCG/ACT inhaler 1 puff 4 (four) times daily as needed for wheezing or shortness of breath.  ? aspirin 81 MG EC tablet Take by mouth.  ? Camphor-Menthol-Methyl Sal (SALONPAS) 3.07-19-08 % PTCH Apply 1 patch topically daily as needed.  ? cetirizine (ZYRTEC) 10 MG chewable tablet Chew 10 mg by mouth daily.  ? DAYVIGO 5 MG TABS Take 1 tablet by mouth at bedtime as needed.  ? diazepam (VALIUM) 5 MG tablet Take 5 mg by mouth 2 (two) times daily as needed.  ? diclofenac Sodium (VOLTAREN) 1 % GEL Apply 2 g topically 4 (four) times daily.  ? DULoxetine (CYMBALTA) 20 MG capsule Take 20 mg by mouth daily.  ? EPINEPHrine (EPIPEN JR) 0.15 MG/0.3ML injection Inject into the muscle.  ? fluticasone (FLONASE) 50 MCG/ACT nasal spray Place 2 sprays into both nostrils daily as needed for allergies.   ? isosorbide mononitrate (IMDUR) 30 MG 24 hr tablet Take 1/2 tablet daily (15 mg) for 1 week. If tolerated, increase to 30 mg daily  ? levothyroxine (SYNTHROID) 25 MCG tablet Take 25 mcg by mouth daily.  ? metoprolol succinate (TOPROL-XL) 25 MG 24 hr tablet Take 25 mg by mouth 2 (two) times daily.  ? Multiple Vitamins-Minerals (ALIVE WOMENS GUMMY PO) Take 1 Dose by mouth daily.  ? nitroGLYCERIN (NITROSTAT) 0.4 MG SL tablet Place 0.4 mg under the tongue every 5 (five) minutes as needed for chest pain.   ? omeprazole (PRILOSEC) 20 MG capsule Take 20 mg by mouth 2 (two) times daily before a meal.  ? ondansetron (ZOFRAN) 4 MG tablet Take 4 mg by mouth 3 (three) times daily as needed.  ? oxyCODONE-acetaminophen (PERCOCET/ROXICET) 5-325 MG tablet Take 1 tablet by mouth 3 (three) times daily as needed.  ? rosuvastatin (CRESTOR) 10 MG tablet Take 10 mg by mouth at bedtime.  ? sertraline (ZOLOFT) 50 MG tablet Take 50 mg by mouth daily.  ? triamterene-hydrochlorothiazide (MAXZIDE-25) 37.5-25 MG per tablet Take 0.5 tablets by mouth every morning.   ? trimethoprim-polymyxin b (POLYTRIM) ophthalmic solution Place  1 drop into the right eye every 4 (four) hours.  ? Vibegron (GEMTESA) 75 MG TABS Take 75 mg by mouth daily.  ? vitamin B-12 (CYANOCOBALAMIN) 1000 MCG tablet Take 2 tablets (2,000 mcg total) by mouth daily. (Patient taking differently: Take 1,000 mcg by mouth daily.)  ? Vitamin D, Ergocalciferol, (DRISDOL) 50000 UNITS CAPS Take 50,000 Units by mouth every Monday.   ? zinc gluconate 50 MG tablet Take 50 mg by mouth daily.  ?  ? ?Allergies:   Celecoxib, Ciprofloxacin hcl, Codeine, Dicyclomine, Lamictal [lamotrigine], Metoclopramide hcl, Morphine, Morphine and related, Omeprazole, Savella [milnacipran hcl], Simvastatin, Tape, Tizanidine, Trazodone and nefazodone, Venlafaxine, Citalopram, and Sertraline hcl  ? ?  Social History  ? ?Socioeconomic History  ? Marital status: Divorced  ?  Spouse name: Not on file  ? Number of children: Not on file  ? Years of education: Not on file  ? Highest education level: Not on file  ?Occupational History  ? Not on file  ?Tobacco Use  ? Smoking status: Every Day  ?  Years: 45.00  ?  Types: Cigarettes  ? Smokeless tobacco: Never  ?Vaping Use  ? Vaping Use: Never used  ?Substance and Sexual Activity  ? Alcohol use: No  ?  Alcohol/week: 0.0 standard drinks  ? Drug use: No  ? Sexual activity: Not on file  ?Other Topics Concern  ? Not on file  ?Social History Narrative  ? Not on file  ? ?Social Determinants of Health  ? ?Financial Resource Strain: Not on file  ?Food Insecurity: Not on file  ?Transportation Needs: Not on file  ?Physical Activity: Not on file  ?Stress: Not on file  ?Social Connections: Not on file  ?  ? ?Family History:  The patient's  family history includes Arthritis in her sister and sister; Cancer in her sister and sister; Diabetes in her brother, brother, and brother; Heart attack in her brother, daughter, father, and mother; Heart disease in her brother, father, mother, and sister; Heart disease (age of onset: 80) in her daughter; Hyperlipidemia in her father and mother;  Hypertension in her brother and daughter; Other in her mother; Peripheral vascular disease in her brother.  ? ?ROS:   ?Please see the history of present illness.    ?ROS All other systems reviewed and

## 2021-11-04 NOTE — Progress Notes (Signed)
? ?Cardiology Office Note   ? ?Date:  11/11/2021  ? ?ID:  [name], DOB May 02, 1951, MRN [id number] ? ? ?PCP:  Glenda Chroman, MD ?  ?[place]  ?Cardiologist:  Rozann Lesches, MD   ?Advanced Practice Provider:  No care team member to display ?Electrophysiologist:  None  ? ?[id number]}  ? ?Chief Complaint  ?Patient presents with  ? Chest Pain  ? ? ?History of Present Illness:  ?[name] is a 71 y.o. female with history of CAD with DES to the RCA and circumflex in April 2010.   ? ?Patient seen 08/19/21 with chest pain and shortness of beath worrisome for angina. Lexiscan  which showed ischemia in RCA and possibly Cfx area. Imdur started. If ongoing symptoms will require cath. Echo for murmur-normal LVEF and aortic valve calcification. Also has asymptomatic bilateral carotid stenosis and will need RCEA and then left carotid redo endarterectomy. ? ?Patient comes in for f/u. Imdur gave her a severe headache so she can only tolerate 15 mg daily. When she vacuums or takes her trash out she gets chest tightness and shortness of breath eases when she stops after 3 min. Occurs daily and not getting better.  ? ?Past Medical History:  ?Diagnosis Date  ? Anxiety   ? Arthritis   ? Rhumatoid and Osteoarthritis  ? Carotid artery disease ([place])   ? Dr. Trula Slade, 60-79% RICA, s/p left CEA  ? Coronary atherosclerosis of native coronary artery   ? DES RCA/circumflex 4/10, LVEF 65  ? Depression   ? Essential hypertension, benign   ? Fibromyalgia   ? GERD (gastroesophageal reflux disease)   ? Hiatal hernia   ? Hyperlipidemia   ? Kidney cysts   ? NSTEMI (non-ST elevated myocardial infarction) ([place])   ? 4/10  ? Renal artery stenosis ([place])   ? Left renal artery stent 12/13  ? Restless leg syndrome   ? Spondylosis without myelopathy   ? ? ?Past Surgical History:  ?Procedure Laterality Date  ? ABDOMINAL HYSTERECTOMY  1977  ? BREAST LUMPECTOMY    ? CAROTID ENDARTERECTOMY Left 02/28/2009  ? CHOLECYSTECTOMY  1991  ?  COLONOSCOPY    ? COLONOSCOPY N/A 05/08/2017  ? Procedure: COLONOSCOPY;  Surgeon: Rogene Houston, MD;  [facility];  Service: Endoscopy;  Laterality: N/A;  12:00  ? ESOPHAGOGASTRODUODENOSCOPY N/A 04/07/2013  ? Procedure: ESOPHAGOGASTRODUODENOSCOPY (EGD);  Surgeon: Rogene Houston, MD;  [facility];  Service: Endoscopy;  Laterality: N/A;  250  ? ESOPHAGOGASTRODUODENOSCOPY N/A 02/02/2014  ? Procedure: ESOPHAGOGASTRODUODENOSCOPY (EGD);  Surgeon: Rogene Houston, MD;  [facility];  Service: Endoscopy;  Laterality: N/A;  120  ? EYE SURGERY    ? Catartact bilateral  ? JOINT REPLACEMENT  March 2013  ? Left knee  ? JOINT REPLACEMENT  2006  ? Left Knee  ? kidney stent    ? Left December 2013  ? LUMBAR DISC SURGERY    ? L5-S1  ? MULTIPLE TOOTH EXTRACTIONS  Jan. 2015  ? Neck Fusion  1996  ? C4 to C6  ? PERCUTANEOUS STENT INTERVENTION Left 06/15/2012  ? Procedure: PERCUTANEOUS STENT INTERVENTION;  Surgeon: Serafina Mitchell, MD;  Location: [facility] CATH LAB;  Service: Cardiovascular;  Laterality: Left;  renal  ? PERIPHERAL VASCULAR BALLOON ANGIOPLASTY  05/26/2017  ? Procedure: PERIPHERAL VASCULAR BALLOON ANGIOPLASTY;  Surgeon: Serafina Mitchell, MD;  Location: [facility];  Service: Cardiovascular;;  Lt. Renal Angioplasty  ?  POLYPECTOMY  05/08/2017  ? Procedure: POLYPECTOMY;  Surgeon: Rogene Houston, MD;  [facility];  Service: Endoscopy;;  colon ?  ? RENAL ANGIOGRAM N/A 06/15/2012  ? Procedure: RENAL ANGIOGRAM;  Surgeon: Serafina Mitchell, MD;  Location: [facility] CATH LAB;  Service: Cardiovascular;  Laterality: N/A;  ? RENAL ANGIOGRAPHY  05/26/2017  ? Procedure: RENAL ANGIOGRAPHY;  Surgeon: Serafina Mitchell, MD;  Location: [place] CV LAB;  Service: Cardiovascular;;  ? [place]  ? Ruptured disc  ? TONSILECTOMY, ADENOIDECTOMY, BILATERAL MYRINGOTOMY AND TUBES  1969  ? TOTAL KNEE ARTHROPLASTY    ? Left  ? TUBAL LIGATION    ? ? ?Current Medications: ?Current Meds  ?Medication Sig   ? albuterol (VENTOLIN HFA) 108 (90 Base) MCG/ACT inhaler 1 puff 4 (four) times daily as needed for wheezing or shortness of breath.  ? aspirin 81 MG EC tablet Take by mouth.  ? Camphor-Menthol-Methyl Sal (SALONPAS) 3.07-19-08 % PTCH Apply 1 patch topically daily as needed.  ? cetirizine (ZYRTEC) 10 MG chewable tablet Chew 10 mg by mouth daily.  ? DAYVIGO 5 MG TABS Take 1 tablet by mouth at bedtime as needed.  ? diazepam (VALIUM) 5 MG tablet Take 5 mg by mouth 2 (two) times daily as needed.  ? diclofenac Sodium (VOLTAREN) 1 % GEL Apply 2 g topically 4 (four) times daily.  ? DULoxetine (CYMBALTA) 20 MG capsule Take 20 mg by mouth daily.  ? EPINEPHrine (EPIPEN JR) 0.15 MG/0.3ML injection Inject into the muscle.  ? fluticasone (FLONASE) 50 MCG/ACT nasal spray Place 2 sprays into both nostrils daily as needed for allergies.   ? isosorbide mononitrate (IMDUR) 30 MG 24 hr tablet Take 1/2 tablet daily (15 mg) for 1 week. If tolerated, increase to 30 mg daily  ? levothyroxine (SYNTHROID) 25 MCG tablet Take 25 mcg by mouth daily.  ? metoprolol succinate (TOPROL-XL) 25 MG 24 hr tablet Take 25 mg by mouth 2 (two) times daily.  ? Multiple Vitamins-Minerals (ALIVE WOMENS GUMMY PO) Take 1 Dose by mouth daily.  ? nitroGLYCERIN (NITROSTAT) 0.4 MG SL tablet Place 0.4 mg under the tongue every 5 (five) minutes as needed for chest pain.   ? omeprazole (PRILOSEC) 20 MG capsule Take 20 mg by mouth 2 (two) times daily before a meal.  ? ondansetron (ZOFRAN) 4 MG tablet Take 4 mg by mouth 3 (three) times daily as needed.  ? oxyCODONE-acetaminophen (PERCOCET/ROXICET) 5-325 MG tablet Take 1 tablet by mouth 3 (three) times daily as needed.  ? rosuvastatin (CRESTOR) 10 MG tablet Take 10 mg by mouth at bedtime.  ? sertraline (ZOLOFT) 50 MG tablet Take 50 mg by mouth daily.  ? triamterene-hydrochlorothiazide (MAXZIDE-25) 37.5-25 MG per tablet Take 0.5 tablets by mouth every morning.   ? trimethoprim-polymyxin b (POLYTRIM) ophthalmic solution Place  1 drop into the right eye every 4 (four) hours.  ? Vibegron (GEMTESA) 75 MG TABS Take 75 mg by mouth daily.  ? vitamin B-12 (CYANOCOBALAMIN) 1000 MCG tablet Take 2 tablets (2,000 mcg total) by mouth daily. (Patient taking differently: Take 1,000 mcg by mouth daily.)  ? Vitamin D, Ergocalciferol, (DRISDOL) 50000 UNITS CAPS Take 50,000 Units by mouth every Monday.   ? zinc gluconate 50 MG tablet Take 50 mg by mouth daily.  ?  ? ?Allergies:   Celecoxib, Ciprofloxacin hcl, Codeine, Dicyclomine, Lamictal [lamotrigine], Metoclopramide hcl, Morphine, Morphine and related, Omeprazole, Savella [milnacipran hcl], Simvastatin, Tape, Tizanidine, Trazodone and nefazodone, Venlafaxine, Citalopram, and Sertraline hcl  ? ?  Social History  ? ?Socioeconomic History  ? Marital status: Divorced  ?  Spouse name: Not on file  ? Number of children: Not on file  ? Years of education: Not on file  ? Highest education level: Not on file  ?Occupational History  ? Not on file  ?Tobacco Use  ? Smoking status: Every Day  ?  Years: 45.00  ?  Types: Cigarettes  ? Smokeless tobacco: Never  ?Vaping Use  ? Vaping Use: Never used  ?Substance and Sexual Activity  ? Alcohol use: No  ?  Alcohol/week: 0.0 standard drinks  ? Drug use: No  ? Sexual activity: Not on file  ?Other Topics Concern  ? Not on file  ?Social History Narrative  ? Not on file  ? ?Social Determinants of Health  ? ?Financial Resource Strain: Not on file  ?Food Insecurity: Not on file  ?Transportation Needs: Not on file  ?Physical Activity: Not on file  ?Stress: Not on file  ?Social Connections: Not on file  ?  ? ?Family History:  The patient's  family history includes Arthritis in her sister and sister; Cancer in her sister and sister; Diabetes in her brother, brother, and brother; Heart attack in her brother, daughter, father, and mother; Heart disease in her brother, father, mother, and sister; Heart disease (age of onset: 36) in her daughter; Hyperlipidemia in her father and mother;  Hypertension in her brother and daughter; Other in her mother; Peripheral vascular disease in her brother.  ? ?ROS:   ?Please see the history of present illness.    ?ROS All other systems reviewed and

## 2021-11-09 IMAGING — DX DG CHEST 1V PORT
1 series · 1 of 1 positions shown · non-contrast
Comparison: Chest radiograph dated 02/26/2009.

CLINICAL DATA: 69-year-old female with altered mental status.

EXAM:
PORTABLE CHEST 1 VIEW

[chest ap]
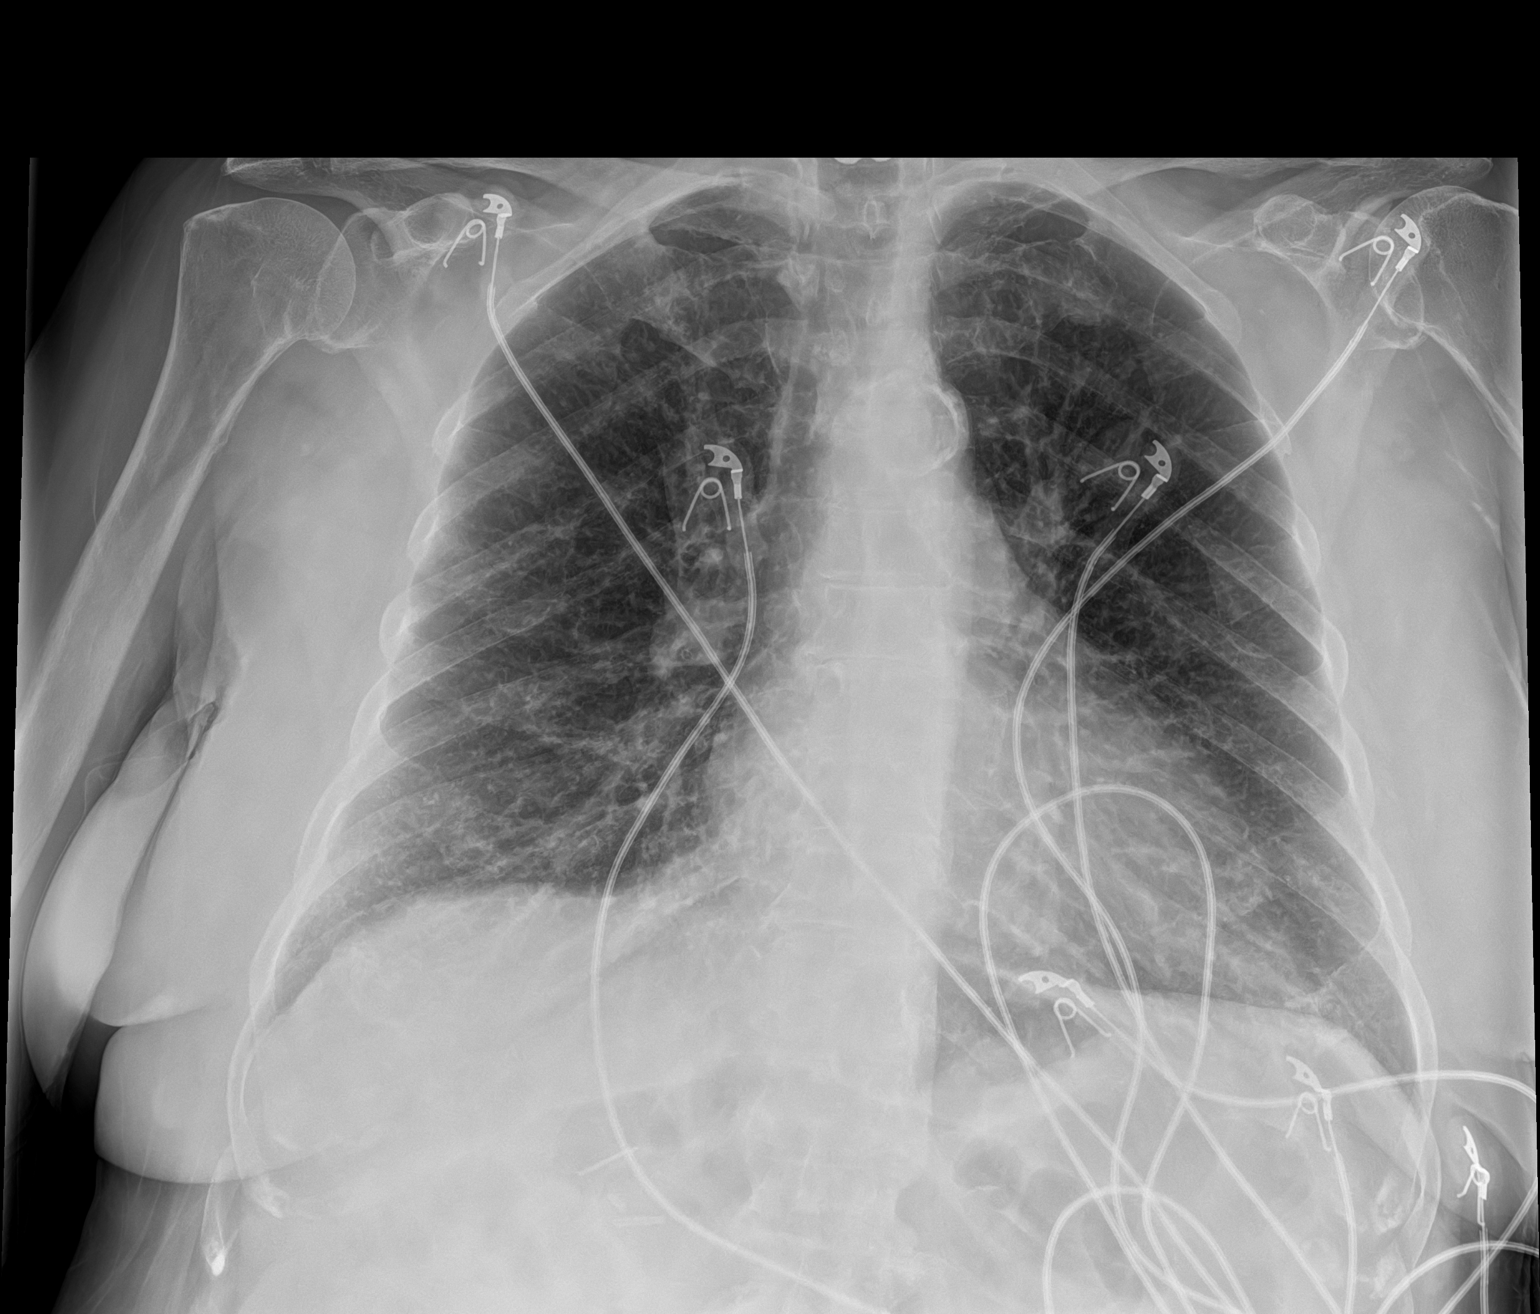

[1 of 1 positions shown; findings below may reference images not displayed]

FINDINGS: There is mild diffuse interstitial coarsening and bronchitic
changes. No focal consolidation, pleural effusion or pneumothorax.
The cardiac silhouette is within limits. No acute osseous pathology.
Atherosclerotic calcification of the aorta. Lower cervical ACDF.
IMPRESSION: No acute cardiopulmonary process.

## 2021-11-11 ENCOUNTER — Encounter: Payer: Self-pay | Admitting: Physician Assistant

## 2021-11-11 ENCOUNTER — Ambulatory Visit (INDEPENDENT_AMBULATORY_CARE_PROVIDER_SITE_OTHER): Payer: 59 | Admitting: Physician Assistant

## 2021-11-11 ENCOUNTER — Telehealth: Payer: Self-pay

## 2021-11-11 ENCOUNTER — Other Ambulatory Visit (HOSPITAL_COMMUNITY)
Admission: RE | Admit: 2021-11-11 | Discharge: 2021-11-11 | Disposition: A | Discharge status: Home / Self Care | Payer: 59 | Source: Ambulatory Visit | Attending: Physician Assistant | Admitting: Physician Assistant

## 2021-11-11 VITALS — BP 146/64 | HR 66 | Ht 59.0 in | Wt 165.0 lb

## 2021-11-11 DIAGNOSIS — I10 Essential (primary) hypertension: Secondary | ICD-10-CM

## 2021-11-11 DIAGNOSIS — I2510 Atherosclerotic heart disease of native coronary artery without angina pectoris: Secondary | ICD-10-CM | POA: Insufficient documentation

## 2021-11-11 DIAGNOSIS — R931 Abnormal findings on diagnostic imaging of heart and coronary circulation: Secondary | ICD-10-CM

## 2021-11-11 DIAGNOSIS — I6523 Occlusion and stenosis of bilateral carotid arteries: Secondary | ICD-10-CM

## 2021-11-11 DIAGNOSIS — R079 Chest pain, unspecified: Secondary | ICD-10-CM

## 2021-11-11 DIAGNOSIS — Z720 Tobacco use: Secondary | ICD-10-CM

## 2021-11-11 LAB — BASIC METABOLIC PANEL
Anion gap: 9 (ref 5–15)
BUN: 17 mg/dL (ref 8–23)
CO2: 27 mmol/L (ref 22–32)
Calcium: 9.3 mg/dL (ref 8.9–10.3)
Chloride: 93 mmol/L — ABNORMAL LOW (ref 98–111)
Creatinine, Ser: 0.97 mg/dL (ref 0.44–1.00)
GFR, Estimated: >60 mL/min (ref >60)
Glucose, Bld: 90 mg/dL (ref 70–99)
Potassium: 4.7 mmol/L (ref 3.5–5.1)
Sodium: 129 mmol/L — ABNORMAL LOW (ref 135–145)

## 2021-11-11 LAB — CBC
HCT: 39 % (ref 36.0–46.0)
Hemoglobin: 13.4 g/dL (ref 12.0–15.0)
MCH: 31.3 pg (ref 26.0–34.0)
MCHC: 34.4 g/dL (ref 30.0–36.0)
MCV: 91.1 fL (ref 80.0–100.0)
Platelets: 207 10*3/uL (ref 150–400)
RBC: 4.28 MIL/uL (ref 3.87–5.11)
RDW: 12.6 % (ref 11.5–15.5)
WBC: 8.6 10*3/uL (ref 4.0–10.5)
nRBC: 0 % (ref 0.0–0.2)

## 2021-11-11 MED ORDER — TRIAMTERENE-HCTZ 37.5-25 MG PO TABS: 0.5000 | ORAL_TABLET | Freq: Every morning | ORAL | 0 refills | Status: DC

## 2021-11-11 NOTE — Telephone Encounter (Signed)
-----   Message from Imogene Burn, PA-C sent at 11/11/2021  4:01 PM EDT ----- ?Sodium low. Please stop maxide until after cath. Will need repeat bmet in the hospital.thanks ?

## 2021-11-11 NOTE — Telephone Encounter (Signed)
Left message to call back  

## 2021-11-11 NOTE — Telephone Encounter (Signed)
Patient was returning call. Please advise ?

## 2021-11-11 NOTE — Telephone Encounter (Signed)
I spoke with patient.She will Hold maxzide until instructions given after heart cath. ?

## 2021-11-11 NOTE — Patient Instructions (Addendum)
?  [facility] ?[place] ?[address] ?[place] [address] ?Dept: [phone] ?Loc: [phone] ? ?[name]  11/11/2021 ? ?You are scheduled for a Cardiac Catheterization on Wednesday, May 3 with Dr. Kathlyn Sacramento. ? ?1. Please arrive at the Main Entrance A at [facility]: [place], [place] [address] at 12:30 PM (This time is two hours before your procedure to ensure your preparation). Free valet parking service is available.  ? ?Special note: Every effort is made to have your procedure done on time. Please understand that emergencies sometimes delay scheduled procedures. ? ?2. Diet: Do not eat solid foods after midnight.  You may have clear liquids until 5 AM upon the day of the procedure. ? ?3. Labs: You will need to have blood drawn on TODAY ,  at [facility] lab ? ?4. Medication instructions in preparation for your procedure: ? ? Contrast Allergy: No ? ?Stop taking, HTCZ (Hydrochlorothiazide) the morning of cath ? ? ? ? ? ?On the morning of your procedure, take Aspirin  81 mg and any morning medicines NOT listed above.  You may use sips of water. ? ?5. Plan to go home the same day, you will only stay overnight if medically necessary. ?6. You MUST have a responsible adult to drive you home. ?7. An adult MUST be with you the first 24 hours after you arrive home. ?8. Bring a current list of your medications, and the last time and date medication taken. ?9. Bring ID and current insurance cards. ?10.Please wear clothes that are easy to get on and off and wear slip-on shoes. ? ?Thank you for allowing [place] to care for you! ?  -- [facility] ? ?

## 2021-11-12 ENCOUNTER — Telehealth: Payer: Self-pay | Admitting: *Deleted

## 2021-11-12 NOTE — Telephone Encounter (Signed)
Cardiac Catheterization scheduled at [facility] for: Wednesday Nov 13, 2021 2:30 PM ?Arrival time and place: [facility] Main Entrance A at: 12 Noon-needs BMP ? ? ?No solid food after midnight prior to cath, clear liquids until 5 AM day of procedure. ? ?Medication instructions: ?-Hold: ? Maxide-on hold until post procedure ?-Except hold medications usual morning medications can be taken with sips of water including aspirin 81 mg. ? ?Confirmed patient has responsible adult to drive home post procedure and be with patient first 24 hours after arriving home. ? ?Patient reports no new symptoms concerning for COVID-19/no exposure to COVID-19 in the past 10 days. ? ?Reviewed procedure instructions with patient.  ? ?

## 2021-11-13 ENCOUNTER — Other Ambulatory Visit: Payer: Self-pay

## 2021-11-13 ENCOUNTER — Inpatient Hospital Stay (HOSPITAL_COMMUNITY)
Admission: AD | Disposition: A | Discharge status: Home / Self Care | Payer: Self-pay | Source: Home / Self Care | Attending: Cardiovascular Disease

## 2021-11-13 ENCOUNTER — Inpatient Hospital Stay (HOSPITAL_COMMUNITY)
Admission: AD | Admit: 2021-11-13 | Discharge: 2021-11-16 | DRG: 287 | Disposition: A | Discharge status: Home / Self Care | Payer: 59 | Attending: Cardiovascular Disease | Admitting: Cardiovascular Disease

## 2021-11-13 DIAGNOSIS — Z881 Allergy status to other antibiotic agents status: Secondary | ICD-10-CM

## 2021-11-13 DIAGNOSIS — Z79890 Hormone replacement therapy (postmenopausal): Secondary | ICD-10-CM

## 2021-11-13 DIAGNOSIS — M069 Rheumatoid arthritis, unspecified: Secondary | ICD-10-CM | POA: Diagnosis present

## 2021-11-13 DIAGNOSIS — I252 Old myocardial infarction: Secondary | ICD-10-CM

## 2021-11-13 DIAGNOSIS — I700 Atherosclerosis of aorta: Secondary | ICD-10-CM | POA: Diagnosis present

## 2021-11-13 DIAGNOSIS — I200 Unstable angina: Principal | ICD-10-CM

## 2021-11-13 DIAGNOSIS — M797 Fibromyalgia: Secondary | ICD-10-CM | POA: Diagnosis present

## 2021-11-13 DIAGNOSIS — I2510 Atherosclerotic heart disease of native coronary artery without angina pectoris: Secondary | ICD-10-CM | POA: Diagnosis not present

## 2021-11-13 DIAGNOSIS — I6529 Occlusion and stenosis of unspecified carotid artery: Secondary | ICD-10-CM | POA: Diagnosis present

## 2021-11-13 DIAGNOSIS — G2581 Restless legs syndrome: Secondary | ICD-10-CM | POA: Diagnosis present

## 2021-11-13 DIAGNOSIS — Z8261 Family history of arthritis: Secondary | ICD-10-CM | POA: Diagnosis not present

## 2021-11-13 DIAGNOSIS — Z8249 Family history of ischemic heart disease and other diseases of the circulatory system: Secondary | ICD-10-CM

## 2021-11-13 DIAGNOSIS — I10 Essential (primary) hypertension: Secondary | ICD-10-CM

## 2021-11-13 DIAGNOSIS — K219 Gastro-esophageal reflux disease without esophagitis: Secondary | ICD-10-CM | POA: Diagnosis present

## 2021-11-13 DIAGNOSIS — R079 Chest pain, unspecified: Secondary | ICD-10-CM

## 2021-11-13 DIAGNOSIS — Z885 Allergy status to narcotic agent status: Secondary | ICD-10-CM

## 2021-11-13 DIAGNOSIS — Z981 Arthrodesis status: Secondary | ICD-10-CM

## 2021-11-13 DIAGNOSIS — R9389 Abnormal findings on diagnostic imaging of other specified body structures: Secondary | ICD-10-CM

## 2021-11-13 DIAGNOSIS — Z95828 Presence of other vascular implants and grafts: Secondary | ICD-10-CM

## 2021-11-13 DIAGNOSIS — Z716 Tobacco abuse counseling: Secondary | ICD-10-CM | POA: Diagnosis not present

## 2021-11-13 DIAGNOSIS — E669 Obesity, unspecified: Secondary | ICD-10-CM | POA: Diagnosis present

## 2021-11-13 DIAGNOSIS — R931 Abnormal findings on diagnostic imaging of heart and coronary circulation: Secondary | ICD-10-CM

## 2021-11-13 DIAGNOSIS — Z83438 Family history of other disorder of lipoprotein metabolism and other lipidemia: Secondary | ICD-10-CM | POA: Diagnosis not present

## 2021-11-13 DIAGNOSIS — E039 Hypothyroidism, unspecified: Secondary | ICD-10-CM | POA: Diagnosis present

## 2021-11-13 DIAGNOSIS — F17210 Nicotine dependence, cigarettes, uncomplicated: Secondary | ICD-10-CM | POA: Diagnosis present

## 2021-11-13 DIAGNOSIS — Z7982 Long term (current) use of aspirin: Secondary | ICD-10-CM

## 2021-11-13 DIAGNOSIS — I25110 Atherosclerotic heart disease of native coronary artery with unstable angina pectoris: Secondary | ICD-10-CM | POA: Diagnosis present

## 2021-11-13 DIAGNOSIS — F32A Depression, unspecified: Secondary | ICD-10-CM | POA: Diagnosis present

## 2021-11-13 DIAGNOSIS — Z96652 Presence of left artificial knee joint: Secondary | ICD-10-CM | POA: Diagnosis present

## 2021-11-13 DIAGNOSIS — I2584 Coronary atherosclerosis due to calcified coronary lesion: Secondary | ICD-10-CM | POA: Diagnosis present

## 2021-11-13 DIAGNOSIS — Z833 Family history of diabetes mellitus: Secondary | ICD-10-CM

## 2021-11-13 DIAGNOSIS — Z6832 Body mass index (BMI) 32.0-32.9, adult: Secondary | ICD-10-CM

## 2021-11-13 DIAGNOSIS — I6521 Occlusion and stenosis of right carotid artery: Secondary | ICD-10-CM | POA: Diagnosis not present

## 2021-11-13 DIAGNOSIS — Z955 Presence of coronary angioplasty implant and graft: Secondary | ICD-10-CM | POA: Diagnosis not present

## 2021-11-13 DIAGNOSIS — Z91048 Other nonmedicinal substance allergy status: Secondary | ICD-10-CM

## 2021-11-13 DIAGNOSIS — I6523 Occlusion and stenosis of bilateral carotid arteries: Secondary | ICD-10-CM | POA: Diagnosis present

## 2021-11-13 DIAGNOSIS — F419 Anxiety disorder, unspecified: Secondary | ICD-10-CM | POA: Diagnosis present

## 2021-11-13 DIAGNOSIS — E785 Hyperlipidemia, unspecified: Secondary | ICD-10-CM | POA: Diagnosis present

## 2021-11-13 DIAGNOSIS — Z79899 Other long term (current) drug therapy: Secondary | ICD-10-CM

## 2021-11-13 DIAGNOSIS — Z720 Tobacco use: Secondary | ICD-10-CM

## 2021-11-13 DIAGNOSIS — Z888 Allergy status to other drugs, medicaments and biological substances status: Secondary | ICD-10-CM

## 2021-11-13 HISTORY — DX: Abnormal findings on diagnostic imaging of other specified body structures: R93.89

## 2021-11-13 HISTORY — DX: Essential (primary) hypertension: I10

## 2021-11-13 HISTORY — PX: LEFT HEART CATH AND CORONARY ANGIOGRAPHY: CATH118249

## 2021-11-13 LAB — BASIC METABOLIC PANEL
Anion gap: 10 (ref 5–15)
BUN: 12 mg/dL (ref 8–23)
CO2: 26 mmol/L (ref 22–32)
Calcium: 8.9 mg/dL (ref 8.9–10.3)
Chloride: 99 mmol/L (ref 98–111)
Creatinine, Ser: 0.97 mg/dL (ref 0.44–1.00)
GFR, Estimated: >60 mL/min (ref >60)
Glucose, Bld: 94 mg/dL (ref 70–99)
Potassium: 4.7 mmol/L (ref 3.5–5.1)
Sodium: 135 mmol/L (ref 135–145)

## 2021-11-13 SURGERY — LEFT HEART CATH AND CORONARY ANGIOGRAPHY
Anesthesia: LOCAL

## 2021-11-13 MED ORDER — MIDAZOLAM HCL 2 MG/2ML IJ SOLN
INTRAMUSCULAR | Status: AC
Start: 2021-11-13 — End: ?
  Filled 2021-11-13: qty 2

## 2021-11-13 MED ORDER — VERAPAMIL HCL 2.5 MG/ML IV SOLN: INTRAVENOUS | Status: DC | PRN

## 2021-11-13 MED ORDER — SODIUM CHLORIDE 0.9 % IV SOLN: INTRAVENOUS | Status: AC

## 2021-11-13 MED ORDER — ASPIRIN EC 81 MG PO TBEC
81.0000 mg | DELAYED_RELEASE_TABLET | Freq: Every day | ORAL | Status: DC
Start: 2021-11-13 — End: 2021-11-16
  Administered 2021-11-14 – 2021-11-16 (×3): 81 mg via ORAL
  Filled 2021-11-13 (×3): qty 1

## 2021-11-13 MED ORDER — DIAZEPAM 5 MG PO TABS
5.0000 mg | ORAL_TABLET | Freq: Two times a day (BID) | ORAL | Status: DC | PRN
  Administered 2021-11-13 – 2021-11-15 (×2): 5 mg via ORAL
  Filled 2021-11-13: qty 1

## 2021-11-13 MED ORDER — PANTOPRAZOLE SODIUM 40 MG PO TBEC
40.0000 mg | DELAYED_RELEASE_TABLET | Freq: Every day | ORAL | Status: DC
  Administered 2021-11-13 – 2021-11-16 (×4): 40 mg via ORAL
  Filled 2021-11-13 (×4): qty 1

## 2021-11-13 MED ORDER — LEVOTHYROXINE SODIUM 25 MCG PO TABS
25.0000 ug | ORAL_TABLET | Freq: Every day | ORAL | Status: DC
  Administered 2021-11-14 – 2021-11-16 (×3): 25 ug via ORAL
  Filled 2021-11-13 (×3): qty 1

## 2021-11-13 MED ORDER — SODIUM CHLORIDE 0.9% FLUSH: 3.0000 mL | Freq: Two times a day (BID) | INTRAVENOUS | Status: DC

## 2021-11-13 MED ORDER — NITROGLYCERIN IN D5W 200-5 MCG/ML-% IV SOLN
INTRAVENOUS | Status: AC
  Filled 2021-11-13: qty 250

## 2021-11-13 MED ORDER — SODIUM CHLORIDE 0.9% FLUSH
3.0000 mL | Freq: Two times a day (BID) | INTRAVENOUS | Status: DC
  Administered 2021-11-13 – 2021-11-16 (×3): 3 mL via INTRAVENOUS

## 2021-11-13 MED ORDER — VITAMIN D (ERGOCALCIFEROL) 1.25 MG (50000 UNIT) PO CAPS: 50000.0000 [IU] | ORAL_CAPSULE | ORAL | Status: DC

## 2021-11-13 MED ORDER — IOHEXOL 350 MG/ML SOLN
INTRAVENOUS | Status: DC | PRN
Start: 2021-11-13 — End: 2021-11-13
  Administered 2021-11-13: 90 mL

## 2021-11-13 MED ORDER — ASPIRIN 81 MG PO CHEW: 81.0000 mg | CHEWABLE_TABLET | ORAL | Status: DC

## 2021-11-13 MED ORDER — SODIUM CHLORIDE 0.9 % WEIGHT BASED INFUSION
3.0000 mL/kg/h | INTRAVENOUS | Status: DC
  Administered 2021-11-13: 3 mL/kg/h via INTRAVENOUS

## 2021-11-13 MED ORDER — ALBUTEROL SULFATE (2.5 MG/3ML) 0.083% IN NEBU: 3.0000 mL | INHALATION_SOLUTION | Freq: Four times a day (QID) | RESPIRATORY_TRACT | Status: DC | PRN

## 2021-11-13 MED ORDER — VERAPAMIL HCL 2.5 MG/ML IV SOLN
INTRAVENOUS | Status: AC
  Filled 2021-11-13: qty 2

## 2021-11-13 MED ORDER — LABETALOL HCL 5 MG/ML IV SOLN
INTRAVENOUS | Status: AC
  Filled 2021-11-13: qty 4

## 2021-11-13 MED ORDER — LABETALOL HCL 5 MG/ML IV SOLN
10.0000 mg | INTRAVENOUS | Status: AC | PRN
  Administered 2021-11-13: 10 mg via INTRAVENOUS

## 2021-11-13 MED ORDER — ACETAMINOPHEN 325 MG PO TABS
650.0000 mg | ORAL_TABLET | ORAL | Status: DC | PRN
  Administered 2021-11-14 – 2021-11-15 (×2): 650 mg via ORAL
  Filled 2021-11-13 (×3): qty 2

## 2021-11-13 MED ORDER — SODIUM CHLORIDE 0.9 % IV SOLN: 250.0000 mL | INTRAVENOUS | Status: DC | PRN

## 2021-11-13 MED ORDER — SODIUM CHLORIDE 0.9% FLUSH: 3.0000 mL | INTRAVENOUS | Status: DC | PRN

## 2021-11-13 MED ORDER — FENTANYL CITRATE (PF) 100 MCG/2ML IJ SOLN
INTRAMUSCULAR | Status: AC
Start: 2021-11-13 — End: ?
  Filled 2021-11-13: qty 2

## 2021-11-13 MED ORDER — NITROGLYCERIN IN D5W 200-5 MCG/ML-% IV SOLN
5.0000 ug/min | INTRAVENOUS | Status: DC
  Administered 2021-11-13: 5 ug/min via INTRAVENOUS
  Administered 2021-11-13: 10 ug/min via INTRAVENOUS
  Filled 2021-11-13: qty 250

## 2021-11-13 MED ORDER — LABETALOL HCL 5 MG/ML IV SOLN
INTRAVENOUS | Status: DC | PRN
  Administered 2021-11-13: 10 mg via INTRAVENOUS

## 2021-11-13 MED ORDER — FLUTICASONE PROPIONATE 50 MCG/ACT NA SUSP
2.0000 | Freq: Every day | NASAL | Status: DC | PRN
  Filled 2021-11-13: qty 16

## 2021-11-13 MED ORDER — LIDOCAINE HCL (PF) 1 % IJ SOLN
INTRAMUSCULAR | Status: AC
  Filled 2021-11-13: qty 30

## 2021-11-13 MED ORDER — ZINC GLUCONATE 50 MG PO TABS: 50.0000 mg | ORAL_TABLET | Freq: Every day | ORAL | Status: DC

## 2021-11-13 MED ORDER — HEPARIN SODIUM (PORCINE) 1000 UNIT/ML IJ SOLN
INTRAMUSCULAR | Status: DC | PRN
Start: 2021-11-13 — End: 2021-11-13
  Administered 2021-11-13: 3500 [IU] via INTRAVENOUS

## 2021-11-13 MED ORDER — SERTRALINE HCL 50 MG PO TABS
50.0000 mg | ORAL_TABLET | Freq: Every day | ORAL | Status: DC
  Administered 2021-11-13 – 2021-11-16 (×4): 50 mg via ORAL
  Filled 2021-11-13 (×4): qty 1

## 2021-11-13 MED ORDER — VIBEGRON 75 MG PO TABS: 75.0000 mg | ORAL_TABLET | Freq: Every day | ORAL | Status: DC

## 2021-11-13 MED ORDER — DIAZEPAM 5 MG PO TABS
ORAL_TABLET | ORAL | Status: AC
  Filled 2021-11-13: qty 1

## 2021-11-13 MED ORDER — TURMERIC 500 MG PO CAPS: 500.0000 mg | ORAL_CAPSULE | Freq: Every day | ORAL | Status: DC

## 2021-11-13 MED ORDER — POLYMYXIN B-TRIMETHOPRIM 10000-0.1 UNIT/ML-% OP SOLN: 1.0000 [drp] | Freq: Four times a day (QID) | OPHTHALMIC | Status: DC

## 2021-11-13 MED ORDER — ENOXAPARIN SODIUM 40 MG/0.4ML IJ SOSY
40.0000 mg | PREFILLED_SYRINGE | INTRAMUSCULAR | Status: DC
  Administered 2021-11-14 – 2021-11-16 (×3): 40 mg via SUBCUTANEOUS
  Filled 2021-11-13 (×3): qty 0.4

## 2021-11-13 MED ORDER — ROSUVASTATIN CALCIUM 5 MG PO TABS
10.0000 mg | ORAL_TABLET | Freq: Every day | ORAL | Status: DC
Start: 2021-11-13 — End: 2021-11-16
  Administered 2021-11-13 – 2021-11-15 (×3): 10 mg via ORAL
  Filled 2021-11-13 (×4): qty 2
  Filled 2021-11-13: qty 1

## 2021-11-13 MED ORDER — HEPARIN (PORCINE) IN NACL 1000-0.9 UT/500ML-% IV SOLN
INTRAVENOUS | Status: DC | PRN
Start: 2021-11-13 — End: 2021-11-13
  Administered 2021-11-13 (×2): 500 mL

## 2021-11-13 MED ORDER — OXYCODONE-ACETAMINOPHEN 5-325 MG PO TABS
1.0000 | ORAL_TABLET | Freq: Three times a day (TID) | ORAL | Status: DC | PRN
Start: 2021-11-13 — End: 2021-11-16
  Administered 2021-11-13 – 2021-11-16 (×6): 1 via ORAL
  Filled 2021-11-13 (×6): qty 1

## 2021-11-13 MED ORDER — MIDAZOLAM HCL 2 MG/2ML IJ SOLN
INTRAMUSCULAR | Status: DC | PRN
Start: 2021-11-13 — End: 2021-11-13
  Administered 2021-11-13 (×2): 1 mg via INTRAVENOUS

## 2021-11-13 MED ORDER — TRIAMTERENE-HCTZ 37.5-25 MG PO TABS
0.5000 | ORAL_TABLET | Freq: Every morning | ORAL | Status: DC
  Administered 2021-11-14 – 2021-11-16 (×3): 0.5 via ORAL
  Filled 2021-11-13 (×3): qty 0.5

## 2021-11-13 MED ORDER — FENTANYL CITRATE (PF) 100 MCG/2ML IJ SOLN
INTRAMUSCULAR | Status: DC | PRN
  Administered 2021-11-13: 50 ug via INTRAVENOUS
  Administered 2021-11-13: 25 ug via INTRAVENOUS

## 2021-11-13 MED ORDER — HEPARIN (PORCINE) IN NACL 1000-0.9 UT/500ML-% IV SOLN
INTRAVENOUS | Status: AC
  Filled 2021-11-13: qty 1000

## 2021-11-13 MED ORDER — DULOXETINE HCL 20 MG PO CPEP
20.0000 mg | ORAL_CAPSULE | Freq: Every day | ORAL | Status: DC
  Administered 2021-11-13 – 2021-11-15 (×3): 20 mg via ORAL
  Filled 2021-11-13 (×4): qty 1

## 2021-11-13 MED ORDER — ONDANSETRON HCL 4 MG PO TABS
4.0000 mg | ORAL_TABLET | Freq: Three times a day (TID) | ORAL | Status: DC | PRN
  Administered 2021-11-16: 4 mg via ORAL
  Filled 2021-11-13: qty 1

## 2021-11-13 MED ORDER — METAMUCIL FIBER PO CHEW: 3.0000 | CHEWABLE_TABLET | Freq: Every day | ORAL | Status: DC

## 2021-11-13 MED ORDER — SODIUM CHLORIDE 0.9 % WEIGHT BASED INFUSION: 1.0000 mL/kg/h | INTRAVENOUS | Status: DC

## 2021-11-13 MED ORDER — OMEGA-3-ACID ETHYL ESTERS 1 G PO CAPS
1000.0000 mg | ORAL_CAPSULE | Freq: Every day | ORAL | Status: DC
  Administered 2021-11-14 – 2021-11-16 (×3): 1000 mg via ORAL
  Filled 2021-11-13 (×4): qty 1

## 2021-11-13 MED ORDER — NITROGLYCERIN 0.4 MG SL SUBL: 0.4000 mg | SUBLINGUAL_TABLET | SUBLINGUAL | Status: DC | PRN

## 2021-11-13 MED ORDER — HEPARIN (PORCINE) IN NACL 1000-0.9 UT/500ML-% IV SOLN
INTRAVENOUS | Status: AC
  Filled 2021-11-13: qty 500

## 2021-11-13 MED ORDER — LIDOCAINE HCL (PF) 1 % IJ SOLN
INTRAMUSCULAR | Status: DC | PRN
  Administered 2021-11-13: 2 mL

## 2021-11-13 SURGICAL SUPPLY — 9 items
CATH OPTITORQUE TIG 4.0 5F (CATHETERS) ×1 IMPLANT
DEVICE RAD COMP TR BAND LRG (VASCULAR PRODUCTS) ×1 IMPLANT
GLIDESHEATH SLEND SS 6F .021 (SHEATH) ×1 IMPLANT
GUIDEWIRE INQWIRE 1.5J.035X260 (WIRE) IMPLANT
INQWIRE 1.5J .035X260CM (WIRE) ×2
KIT HEART LEFT (KITS) ×2 IMPLANT
PACK CARDIAC CATHETERIZATION (CUSTOM PROCEDURE TRAY) ×2 IMPLANT
TRANSDUCER W/STOPCOCK (MISCELLANEOUS) ×2 IMPLANT
TUBING CIL FLEX 10 FLL-RA (TUBING) ×2 IMPLANT

## 2021-11-13 NOTE — Interval H&P Note (Signed)
Cath Lab Visit (complete for each Cath Lab visit) ? ?Clinical Evaluation Leading to the Procedure:  ? ?ACS: No. ? ?Non-ACS:   ? ?Anginal Classification: CCS III ? ?Anti-ischemic medical therapy: Maximal Therapy (2 or more classes of medications) ? ?Non-Invasive Test Results: Intermediate-risk stress test findings: cardiac mortality 1-3%/year ? ?Prior CABG: No previous CABG ? ? ? ? ? ?History and Physical Interval Note: ? ?11/13/2021 ?2:43 PM ? ?[name]  has presented today for surgery, with the diagnosis of chest pain - abnormal nuc.  The various methods of treatment have been discussed with the patient and family. After consideration of risks, benefits and other options for treatment, the patient has consented to  Procedure(s): ?LEFT HEART CATH AND CORONARY ANGIOGRAPHY (N/A) as a surgical intervention.  The patient's history has been reviewed, patient examined, no change in status, stable for surgery.  I have reviewed the patient's chart and labs.  Questions were answered to the patient's satisfaction.   ? ? ?Kathlyn Sacramento ? ? ?

## 2021-11-14 ENCOUNTER — Inpatient Hospital Stay (HOSPITAL_COMMUNITY): Payer: 59

## 2021-11-14 ENCOUNTER — Encounter (HOSPITAL_COMMUNITY): Payer: Self-pay | Admitting: Cardiovascular Disease

## 2021-11-14 DIAGNOSIS — I6521 Occlusion and stenosis of right carotid artery: Secondary | ICD-10-CM

## 2021-11-14 DIAGNOSIS — I200 Unstable angina: Secondary | ICD-10-CM

## 2021-11-14 DIAGNOSIS — I6523 Occlusion and stenosis of bilateral carotid arteries: Secondary | ICD-10-CM

## 2021-11-14 DIAGNOSIS — I2510 Atherosclerotic heart disease of native coronary artery without angina pectoris: Secondary | ICD-10-CM

## 2021-11-14 DIAGNOSIS — Z716 Tobacco abuse counseling: Secondary | ICD-10-CM

## 2021-11-14 LAB — CBC
HCT: 35.5 % — ABNORMAL LOW (ref 36.0–46.0)
Hemoglobin: 12.1 g/dL (ref 12.0–15.0)
MCH: 31.5 pg (ref 26.0–34.0)
MCHC: 34.1 g/dL (ref 30.0–36.0)
MCV: 92.4 fL (ref 80.0–100.0)
Platelets: 187 10*3/uL (ref 150–400)
RBC: 3.84 MIL/uL — ABNORMAL LOW (ref 3.87–5.11)
RDW: 13 % (ref 11.5–15.5)
WBC: 4.6 10*3/uL (ref 4.0–10.5)
nRBC: 0 % (ref 0.0–0.2)

## 2021-11-14 LAB — CREATININE, SERUM
Creatinine, Ser: 1.17 mg/dL — ABNORMAL HIGH (ref 0.44–1.00)
GFR, Estimated: 50 mL/min — ABNORMAL LOW (ref >60)

## 2021-11-14 MED ORDER — METOPROLOL SUCCINATE ER 25 MG PO TB24
25.0000 mg | ORAL_TABLET | Freq: Every day | ORAL | Status: DC
  Administered 2021-11-14: 25 mg via ORAL
  Filled 2021-11-14: qty 1

## 2021-11-14 MED ORDER — IOHEXOL 300 MG/ML  SOLN
70.0000 mL | Freq: Once | INTRAMUSCULAR | Status: AC | PRN
  Administered 2021-11-14: 70 mL via INTRAVENOUS

## 2021-11-14 MED ORDER — MELATONIN 3 MG PO TABS
3.0000 mg | ORAL_TABLET | Freq: Every day | ORAL | Status: DC
  Administered 2021-11-14 – 2021-11-15 (×2): 3 mg via ORAL
  Filled 2021-11-14 (×2): qty 1

## 2021-11-14 MED FILL — Heparin Sod (Porcine)-NaCl IV Soln 1000 Unit/500ML-0.9%: INTRAVENOUS | Qty: 500 | Status: AC

## 2021-11-14 NOTE — Consult Note (Addendum)
? ?   ?[place].[address] ?      [place] [address] ?            [phone]       ? ?[name] ?[place] Record #[id number] ?Date of Birth: Aug 03, 1950 ? ?Referring: Dr. Fletcher Anon MD ?Primary Care: Glenda Chroman, MD ?Primary Cardiologist:Samuel Domenic Polite, MD ? ?Chief Complaint: Chest pain and shortness of breath ?Reason for consultation: Coronary artery disease (significant left main disease included) ? ? ?History of Present Illness:     ?This is a 71 year old female with a past medical history of history of NSTEMI/CAD (with DES to the RCA and circumflex in April 2010), carotid artery disease (80-99% RICA;s/p CEA on left 2010), tobacco abuse, essential hypertension, family history of CAD, hyperlipidemia, renal artery stenosis ([place]), hypothyroidism, restless leg syndrome, fibromyalgia, arthritis (rheumatoid and OA), depression, and hiatal hernia, GERD who has had chest pain and shortness of breath, worrisome for angina. Lexiscan done 08/28/2021 showed ischemia in the RCA and possibly Circumflex area. Echo done 09/19/2021 showed LVEF 60-65%, mild MR, and no pericardial effusion. ? ?Carotid US done 09/19/2021 showed velocities in the right ICA are consistent with a 80-99% stenosis and the right  ECA ?appears >50% stenosed. Velocities in the left ICA are consistent with a 1-39% stenosis. Dr. Trula Slade saw the patient in the office on 10/28/2021. He noted her best option would  be a right carotid endarterectomy with retrograde carotid angiogram and stenting of the ostium if indicated.  At a later date, she will also require intervention of a recurrent mid left common carotid stenosis via redo endarterectomy.  ? ?Cardiac catheterization done 05/03 showed ostial to mid left main with a 90% stenosis, mid to distal Circumflex with a 70% stenosis, and RPDA with a 90% stenosis. A consultation was requested with Dr. Cyndia Bent for the consideration of coronary artery bypass grafting surgery. At the time of my  exam, daughter at bedside. Patient denies chest pain and shortness of breath.  ? ?Current Activity/ Functional Status: ?Patient is independent with mobility/ambulation, transfers, ADL's, IADL's. ?  ?Zubrod Score: ?At the time of surgery this patient?s most appropriate activity status/level should be described as: ?'[]'$     0    Normal activity, no symptoms ?'[x]'$     1    Restricted in physical strenuous activity but ambulatory, able to do out light work ?'[]'$     2    Ambulatory and capable of self care, unable to do work activities, up and about more than 50%  Of the time                            ?'[]'$     3    Only limited self care, in bed greater than 50% of waking hours ?'[]'$     4    Completely disabled, no self care, confined to bed or chair ?'[]'$     5    Moribund ? ?Past Medical History:  ?Diagnosis Date  ? Anxiety   ? Arthritis   ? Rhumatoid and Osteoarthritis  ? Carotid artery disease ([place])   ? Dr. Trula Slade, 60-79% RICA, s/p left CEA  ? Coronary atherosclerosis of native coronary artery   ? DES RCA/circumflex 4/10, LVEF 65  ? Depression   ? Essential hypertension, benign   ? Fibromyalgia   ? GERD (gastroesophageal reflux disease)   ? Hiatal hernia   ? Hyperlipidemia   ? Kidney  cysts   ? NSTEMI (non-ST elevated myocardial infarction) ([place])   ? 4/10  ? Renal artery stenosis ([place])   ? Left renal artery stent 12/13  ? Restless leg syndrome   ? Spondylosis without myelopathy   ? ? ?Past Surgical History:  ?Procedure Laterality Date  ? ABDOMINAL HYSTERECTOMY  1977  ? BREAST LUMPECTOMY    ? CAROTID ENDARTERECTOMY Left 02/28/2009  ? CHOLECYSTECTOMY  1991  ? COLONOSCOPY    ? COLONOSCOPY N/A 05/08/2017  ? Procedure: COLONOSCOPY;  Surgeon: Rogene Houston, MD;  [facility];  Service: Endoscopy;  Laterality: N/A;  12:00  ? ESOPHAGOGASTRODUODENOSCOPY N/A 04/07/2013  ? Procedure: ESOPHAGOGASTRODUODENOSCOPY (EGD);  Surgeon: Rogene Houston, MD;  [facility];  Service: Endoscopy;  Laterality: N/A;  250  ?  ESOPHAGOGASTRODUODENOSCOPY N/A 02/02/2014  ? Procedure: ESOPHAGOGASTRODUODENOSCOPY (EGD);  Surgeon: Rogene Houston, MD;  [facility];  Service: Endoscopy;  Laterality: N/A;  120  ? EYE SURGERY    ? Catartact bilateral  ? JOINT REPLACEMENT  March 2013  ? Left knee  ? JOINT REPLACEMENT  2006  ? Left Knee  ? kidney stent    ? Left December 2013  ? LEFT HEART CATH AND CORONARY ANGIOGRAPHY N/A 11/13/2021  ? Procedure: LEFT HEART CATH AND CORONARY ANGIOGRAPHY;  Surgeon: Wellington Hampshire, MD;  Location: [facility] LAB;  Service: Cardiovascular;  Laterality: N/A;  ? LUMBAR DISC SURGERY    ? L5-S1  ? MULTIPLE TOOTH EXTRACTIONS  Jan. 2015  ? Neck Fusion  1996  ? C4 to C6  ? PERCUTANEOUS STENT INTERVENTION Left 06/15/2012  ? Procedure: PERCUTANEOUS STENT INTERVENTION;  Surgeon: Serafina Mitchell, MD;  Location: [facility] CATH LAB;  Service: Cardiovascular;  Laterality: Left;  renal  ? PERIPHERAL VASCULAR BALLOON ANGIOPLASTY  05/26/2017  ? Procedure: PERIPHERAL VASCULAR BALLOON ANGIOPLASTY;  Surgeon: Serafina Mitchell, MD;  Location: [facility] LAB;  Service: Cardiovascular;;  Lt. Renal Angioplasty  ? POLYPECTOMY  05/08/2017  ? Procedure: POLYPECTOMY;  Surgeon: Rogene Houston, MD;  [facility];  Service: Endoscopy;;  colon ?  ? RENAL ANGIOGRAM N/A 06/15/2012  ? Procedure: RENAL ANGIOGRAM;  Surgeon: Serafina Mitchell, MD;  Location: [facility] CATH LAB;  Service: Cardiovascular;  Laterality: N/A;  ? RENAL ANGIOGRAPHY  05/26/2017  ? Procedure: RENAL ANGIOGRAPHY;  Surgeon: Serafina Mitchell, MD;  Location: [facility] CV LAB;  Service: Cardiovascular;;  ? [place]  ? Ruptured disc  ? TONSILECTOMY, ADENOIDECTOMY, BILATERAL MYRINGOTOMY AND TUBES  1969  ? TOTAL KNEE ARTHROPLASTY    ? Left  ? TUBAL LIGATION    ? ? ?Social History  ? ?Tobacco Use  ?Smoking Status Every Day  ? Years: 45.00  ? Types: Cigarettes  ?Smokeless Tobacco Never  ? Recently quit smoking ? ?Social History  ? ?Substance and Sexual Activity   ?Alcohol Use No  ? Alcohol/week: 0.0 standard drinks  ? ? ?Allergies: ?Allergies  ?Allergen Reactions  ? Celecoxib Itching  ? Ciprofloxacin Hcl Other (See Comments)  ?  Mya have made her sick or bad diarrhea  ? Codeine Itching  ? Dicyclomine Diarrhea and Other (See Comments)  ?  Made diarrhea worse  ? Lamictal [Lamotrigine] Other (See Comments)  ?  Unknown  ? Metoclopramide Hcl Other (See Comments)  ?  Dizziness   ? Morphine And Related Other (See Comments)  ?  Patient states she is not allergic to morphine; states had morphine with  no reaction  ? Omeprazole   ? Savella [Milnacipran Hcl] Other (See Comments)  ?  Unknown   ? Simvastatin   ?  Increased back and muscle pain  ? Tape Other (See Comments)  ?  Skin gets red and skin pulls off, paper tape only  ? Tizanidine Other (See Comments)  ?  unknown  ? Trazodone And Nefazodone Other (See Comments)  ?  hallucinations  ? Venlafaxine Other (See Comments)  ?  Affected eyes, blurred vision  ? Citalopram Palpitations and Other (See Comments)  ?  Heart rate increased  ? Sertraline Hcl Other (See Comments)  ?  Stomach upset  ? ? ?Current Facility-Administered Medications  ?Medication Dose Route Frequency Provider Last Rate Last Admin  ? 0.9 %  sodium chloride infusion  250 mL Intravenous PRN Wellington Hampshire, MD      ? acetaminophen (TYLENOL) tablet 650 mg  650 mg Oral Q4H PRN Wellington Hampshire, MD      ? albuterol (PROVENTIL) (2.5 MG/3ML) 0.083% nebulizer solution 3 mL  3 mL Inhalation Q6H PRN Wellington Hampshire, MD      ? aspirin EC tablet 81 mg  81 mg Oral Daily Wellington Hampshire, MD   81 mg at 11/14/21 0955  ? diazepam (VALIUM) tablet 5 mg  5 mg Oral BID PRN Wellington Hampshire, MD   5 mg at 11/13/21 1620  ? DULoxetine (CYMBALTA) DR capsule 20 mg  20 mg Oral QHS Wellington Hampshire, MD   20 mg at 11/13/21 2158  ? enoxaparin (LOVENOX) injection 40 mg  40 mg Subcutaneous Q24H Kathlyn Sacramento A, MD   40 mg at 11/14/21 0953  ? fluticasone (FLONASE) 50 MCG/ACT nasal spray 2  spray  2 spray Each Nare Daily PRN Wellington Hampshire, MD      ? levothyroxine (SYNTHROID) tablet 25 mcg  25 mcg Oral QAC breakfast Wellington Hampshire, MD   25 mcg at 11/14/21 0800  ? metoprolol succinate (TOPROL-XL) 24

## 2021-11-14 NOTE — Progress Notes (Signed)
? ?Progress Note ? ?Patient Name: [name] ?Date of Encounter: 11/14/2021 ? ?[facility] Cardiologist: Rozann Lesches, MD  ? ?Subjective  ? ?No CP or dyspnea ? ?Inpatient Medications  ?  ?Scheduled Meds: ? aspirin EC  81 mg Oral Daily  ? DULoxetine  20 mg Oral QHS  ? enoxaparin (LOVENOX) injection  40 mg Subcutaneous Q24H  ? levothyroxine  25 mcg Oral QAC breakfast  ? omega-3 acid ethyl esters  1,000 mg Oral Daily  ? pantoprazole  40 mg Oral Daily  ? rosuvastatin  10 mg Oral QHS  ? sertraline  50 mg Oral Daily  ? sodium chloride flush  3 mL Intravenous Q12H  ? triamterene-hydrochlorothiazide  0.5 tablet Oral q morning  ? [START ON 11/18/2021] Vitamin D (Ergocalciferol)  50,000 Units Oral Q Mon  ? ?Continuous Infusions: ? sodium chloride    ? nitroGLYCERIN 10 mcg/min (11/13/21 2029)  ? ?PRN Meds: ?sodium chloride, acetaminophen, albuterol, diazepam, fluticasone, nitroGLYCERIN, ondansetron, oxyCODONE-acetaminophen, sodium chloride flush  ? ?Vital Signs  ?  ?Vitals:  ? 11/13/21 1800 11/13/21 2015 11/13/21 2315 11/14/21 0348  ?BP: (!) 134/56 (!) 141/75 (!) 118/52 (!) 169/58  ?Pulse: 67 68 68 81  ?Resp:  '19 18 17  '$ ?Temp:  98.4 ?F (36.9 ?C) 98 ?F (36.7 ?C) 98.9 ?F (37.2 ?C)  ?TempSrc:  Oral Oral Oral  ?SpO2: 96% 94% 96% 96%  ?Weight:      ?Height:      ? ? ?Intake/Output Summary (Last 24 hours) at 11/14/2021 0928 ?Last data filed at 11/13/2021 2315 ?Gross per 24 hour  ?Intake 240.24 ml  ?Output --  ?Net 240.24 ml  ? ? ?  11/13/2021  ? 12:07 PM 11/11/2021  ?  1:15 PM 10/28/2021  ? 12:13 PM  ?Last 3 Weights  ?Weight (lbs) 162 lb 165 lb 162 lb  ?Weight (kg) 73.483 kg 74.844 kg 73.483 kg  ?   ? ?Telemetry  ?  ?Sinus - Personally Reviewed ? ?Physical Exam  ? ?GEN: No acute distress.   ?Neck: No JVD ?Cardiac: RRR, no murmurs, rubs, or gallops.  ?Respiratory: Clear to auscultation bilaterally. ?GI: Soft, nontender, non-distended  ?MS: No edema, radial cath site with no hematoma ?Neuro:  Nonfocal  ?Psych: Normal affect  ? ?Labs  ?   ? ?Chemistry ?Recent Labs  ?Lab 11/11/21 ?1439 11/13/21 ?1241 11/14/21 ?0335  ?NA 129* 135  --   ?K 4.7 4.7  --   ?CL 93* 99  --   ?CO2 27 26  --   ?GLUCOSE 90 94  --   ?BUN 17 12  --   ?CREATININE 0.97 0.97 1.17*  ?CALCIUM 9.3 8.9  --   ?GFRNONAA >60 >60 50*  ?ANIONGAP 9 10  --   ?  ? ?Hematology ?Recent Labs  ?Lab 11/11/21 ?1439 11/14/21 ?0335  ?WBC 8.6 4.6  ?RBC 4.28 3.84*  ?HGB 13.4 12.1  ?HCT 39.0 35.5*  ?MCV 91.1 92.4  ?MCH 31.3 31.5  ?MCHC 34.4 34.1  ?RDW 12.6 13.0  ?PLT 207 187  ? ? ?Radiology  ?  ?CARDIAC CATHETERIZATION ? ?Result Date: 11/13/2021 ?  Ost LM to Mid LM lesion is 90% stenosed.   Mid Cx lesion is 20% stenosed.   Mid Cx to Dist Cx lesion is 70% stenosed.   Prox Cx lesion is 60% stenosed.   Ost RCA to Prox RCA lesion is 20% stenosed.   Prox RCA to Mid RCA lesion is 30% stenosed.   RPDA lesion is 90%  stenosed.   Prox RCA lesion is 60% stenosed.   RPAV lesion is 60% stenosed. 1.  Severe calcified ostial left main stenosis with significant pressure dampening with catheter engagement.  Patent left circumflex disease with mild in-stent restenosis and progression of native vessel disease in the left circumflex.  Patent RCA stents with diffuse disease in multiple areas with subtotal occlusion of the right PDA with well-developed left-to-right collaterals. 2.  Left ventricular angiography was not performed.  EF was normal by echo.  Severely elevated blood pressure with moderately elevated left ventricular end-diastolic pressure and no significant gradient across the aortic valve. Recommendations: Due to severe left main disease and patient's symptoms, recommend hospital admission for CABG evaluation.  The RCA might not have good targets for CABG but the right PDA has excellent collaterals from the left.   ? ? ?Patient Profile  ?   ?71 y.o. female with past medical history of coronary artery disease, hypertension, hyperlipidemia, fibromyalgia, renal artery stenosis admitted following PCI.  Echocardiogram  March 2023 showed normal LV function, grade 1 diastolic dysfunction, mild mitral regurgitation.  Carotid Dopplers March 2023 showed 80 to 99% right and 1 to 39% left stenosis.  CTA April 2023 showed moderate stenosis in the right subclavian, severe stenosis of the left subclavian at 70%, high-grade stenosis in the right common carotid artery, severe stenosis at the origin of the left common carotid artery estimated at 75% followed by 80% in the common carotid artery.  Cardiac catheterization yesterday showed a 90% ostial left main, 70% circumflex, 90% PDA and 60% right coronary artery.  Patient admitted for consideration of coronary artery bypass and graft. ? ?Assessment & Plan  ?  ?1 CAD-cardiac catheterization results noted.  We will arrange consult with CVTS for consideration of coronary artery bypass and graft.  Continue aspirin, IV nitroglycerin and add toprol 25 mg daily.  Continue statin. ? ?2 carotid artery disease-we will continue aspirin and statin.  We will ask vascular surgery to review (question need for combined surgery). ? ?3 hyperlipidemia-given documented coronary disease will increase Crestor to 40 mg daily. ? ?4 hypertension-blood pressure elevated.  Add Toprol and follow. ? ?5 tobacco abuse-patient counseled on discontinuing. ? ?For questions or updates, please contact [place] ?Please consult [email or web address] for contact info under  ? ?  ?   ?Signed, ?Kirk Ruths, MD  ?11/14/2021, 9:28 AM   ? ?

## 2021-11-14 NOTE — Progress Notes (Signed)
?  Transition of Care (TOC) Screening Note ? ? ?Patient Details  ?Name: [name] ?Date of Birth: [birth year] ? ? ?Transition of Care (TOC) CM/SW Contact:    ?Milas Gain, LCSWA ?Phone Number: ?11/14/2021, 4:21 PM ? ? ? ?Transition of Care Department [facility]) has reviewed patient and no TOC needs have been identified at this time. We will continue to monitor patient advancement through interdisciplinary progression rounds. If new patient transition needs arise, please place a TOC consult. ?  ?

## 2021-11-14 NOTE — Plan of Care (Signed)

## 2021-11-14 NOTE — Consult Note (Addendum)
?Hospital Consult ? ? ? ?Reason for Consult:  severe carotid artery stenosis ?Requesting Physician:  Wynetta Emery, [place] ?MRN #:  [id number] ? ?History of Present Illness: This is a 71 y.o. female who was recently seen by Dr. Trula Slade for severe carotid artery stenosis.  She has hx of extensive left CEA in 2010 for symptomatic disease.  The plaque extended into the proximal common carotid artery and he was unable to get to the proximal portion of the disease and has contacted plaque down.  Dr. Trula Slade also performed  left renal artery stenting for hypertension on multiple medications on 06/15/2012.  A 5 mm stent was placed.  She had a recurrent stenosis treated with balloon angioplasty on 05/26/2017. ? ?She was seen on 10/28/2021 and at that time, she was found to have severe right bifurcation disease, which extended proximally and he did not feel she was a good TCAR candidate.  He felt her best option would be a right carotid endarterectomy with retrograde carotid angiogram and stenting of the ostium if indicated.  At a later date, she will also require intervention of a recurrent mid left common carotid stenosis via redo endarterectomy.  This likely will not have to address the bifurcation.  She would also need further evaluation of the proximal left common carotid artery via retrograde angiography. ? ?She was having persistent angina and complaining of constant abdominal pain and knee pain.  Given her hx of CAD, she was referred for cardiac clearance.  ? ?She had been seen by cardiology is February with CP and SOB.  She had Lexiscan, which showed ischemia in RCA and possibly Cfx area. Imdur started. If she continued to have sx, she would require cardiac cath.  She did continue to have sx and underwent cardiac cath yesterday by Dr. Fletcher Anon.  She was found to have a severe calcified ostial LM stenosis and given this, CT surgery was consulted for CABG.   She did have a CT scan to evaluate her aorta but incidental finding of 84m  LUL nodule as well as mild generalized enlargement of mediastinal lymph nodes with 156mright paratracheal LN. Per impression, this is mildly suspicious for neoplasm.   ? ?VVS is consulted.  She tells me that her abdominal pain has gotten better.  She denies any visual changes, speech difficulties, unilateral weakness, numbness or paralysis.  She is not currently having chest pain.  She denies claudication sx but does get cramping in her legs/feet at night.  She does not have any non healing sores.  She tells me she is more scared than she has ever been.  She does not smoke anymore and states she will never pick up another one.   ? ?The pt is on a statin for cholesterol management.   ?The pt is on a daily aspirin.   Other AC:  Lovenox for DVT prophylaxis ?The pt is on diuretic, BB,  for hypertension.   ?The pt is not diabetic.   ?Tobacco hx:  former ? ?Past Medical History:  ?Diagnosis Date  ? Anxiety   ? Arthritis   ? Rhumatoid and Osteoarthritis  ? [place] Dr. Br[place]ula Slade60-79% RICA, s/p left CEA  ? Coronary atherosclerosis of native coronary artery   ? DES RCA/circumflex 4/10, LVEF 65  ? Depression   ? Essential hypertension, benign   ? Fibromyalgia   ? GERD (gastroesophageal reflux disease)   ? Hiatal hernia   ? Hyperlipidemia   ? Kidney cysts   ?  NSTEMI (non-ST elevated myocardial infarction) ([place])   ? 4/10  ? Renal artery stenosis ([place])   ? Left renal artery stent 12/13  ? Restless leg syndrome   ? Spondylosis without myelopathy   ? ? ?Past Surgical History:  ?Procedure Laterality Date  ? ABDOMINAL HYSTERECTOMY  1977  ? BREAST LUMPECTOMY    ? CAROTID ENDARTERECTOMY Left 02/28/2009  ? CHOLECYSTECTOMY  1991  ? COLONOSCOPY    ? COLONOSCOPY N/A 05/08/2017  ? Procedure: COLONOSCOPY;  Surgeon: Rogene Houston, MD;  [facility];  Service: Endoscopy;  Laterality: N/A;  12:00  ? ESOPHAGOGASTRODUODENOSCOPY N/A 04/07/2013  ? Procedure: ESOPHAGOGASTRODUODENOSCOPY (EGD);  Surgeon: Rogene Houston, MD;  [facility];  Service: Endoscopy;  Laterality: N/A;  250  ? ESOPHAGOGASTRODUODENOSCOPY N/A 02/02/2014  ? Procedure: ESOPHAGOGASTRODUODENOSCOPY (EGD);  Surgeon: Rogene Houston, MD;  [facility];  Service: Endoscopy;  Laterality: N/A;  120  ? EYE SURGERY    ? Catartact bilateral  ? JOINT REPLACEMENT  March 2013  ? Left knee  ? JOINT REPLACEMENT  2006  ? Left Knee  ? kidney stent    ? Left December 2013  ? LEFT HEART CATH AND CORONARY ANGIOGRAPHY N/A 11/13/2021  ? Procedure: LEFT HEART CATH AND CORONARY ANGIOGRAPHY;  Surgeon: Wellington Hampshire, MD;  Location: [place];  Service: Cardiovascular;  Laterality: N/A;  ? LUMBAR DISC SURGERY    ? L5-S1  ? MULTIPLE TOOTH EXTRACTIONS  Jan. 2015  ? Neck Fusion  1996  ? C4 to C6  ? PERCUTANEOUS STENT INTERVENTION Left 06/15/2012  ? Procedure: PERCUTANEOUS STENT INTERVENTION;  Surgeon: Serafina Mitchell, MD;  Location: [facility] CATH LAB;  Service: Cardiovascular;  Laterality: Left;  renal  ? PERIPHERAL VASCULAR BALLOON ANGIOPLASTY  05/26/2017  ? Procedure: PERIPHERAL VASCULAR BALLOON ANGIOPLASTY;  Surgeon: Serafina Mitchell, MD;  Location: [place] [facility];  Service: Cardiovascular;;  Lt. Renal Angioplasty  ? POLYPECTOMY  05/08/2017  ? Procedure: POLYPECTOMY;  Surgeon: Rogene Houston, MD;  [facility];  Service: Endoscopy;;  colon ?  ? RENAL ANGIOGRAM N/A 06/15/2012  ? Procedure: RENAL ANGIOGRAM;  Surgeon: Serafina Mitchell, MD;  Location: [facility] CATH LAB;  Service: Cardiovascular;  Laterality: N/A;  ? RENAL ANGIOGRAPHY  05/26/2017  ? Procedure: RENAL ANGIOGRAPHY;  Surgeon: Serafina Mitchell, MD;  Location: [place] [facility] LAB;  Service: Cardiovascular;;  ? [place]  ? Ruptured disc  ? TONSILECTOMY, ADENOIDECTOMY, BILATERAL MYRINGOTOMY AND TUBES  1969  ? TOTAL KNEE ARTHROPLASTY    ? Left  ? TUBAL LIGATION    ? ? ?Allergies  ?Allergen Reactions  ? Celecoxib Itching  ? Ciprofloxacin Hcl Other (See Comments)  ?  Mya have made her  sick or bad diarrhea  ? Codeine Itching  ? Dicyclomine Diarrhea and Other (See Comments)  ?  Made diarrhea worse  ? Lamictal [Lamotrigine] Other (See Comments)  ?  Unknown  ? Metoclopramide Hcl Other (See Comments)  ?  Dizziness   ? Morphine And Related Other (See Comments)  ?  Patient states she is not allergic to morphine; states had morphine with no reaction  ? Omeprazole   ? Savella [Milnacipran Hcl] Other (See Comments)  ?  Unknown   ? Simvastatin   ?  Increased back and muscle pain  ? Tape Other (See Comments)  ?  Skin gets red and skin pulls off, paper tape only  ? Tizanidine Other (See  Comments)  ?  unknown  ? Trazodone And Nefazodone Other (See Comments)  ?  hallucinations  ? Venlafaxine Other (See Comments)  ?  Affected eyes, blurred vision  ? Citalopram Palpitations and Other (See Comments)  ?  Heart rate increased  ? Sertraline Hcl Other (See Comments)  ?  Stomach upset  ? ? ?Prior to Admission medications   ?Medication Sig Start Date End Date Taking? Authorizing Provider  ?albuterol (VENTOLIN HFA) 108 (90 Base) MCG/ACT inhaler 1 puff 4 (four) times daily as needed for wheezing or shortness of breath. 04/22/21  Yes [provider]  ?aspirin 81 MG EC tablet Take 81 mg by mouth daily.   Yes [provider]  ?CALCIUM PO Take 1 tablet by mouth 2 (two) times daily. Gummy   Yes [provider]  ?cetirizine (ZYRTEC) 10 MG chewable tablet Chew 10 mg by mouth daily as needed for rhinitis.   Yes [provider]  ?DAYVIGO 5 MG TABS Take 5 mg by mouth at bedtime as needed (Sleep). 05/08/21  Yes [provider]  ?diazepam (VALIUM) 5 MG tablet Take 5 mg by mouth 2 (two) times daily as needed for anxiety. 05/04/21  Yes [provider]  ?diclofenac Sodium (VOLTAREN) 1 % GEL Apply 2 g topically 3 (three) times daily.   Yes [provider]  ?DULoxetine (CYMBALTA) 20 MG capsule Take 20 mg by mouth at bedtime. 05/28/21  Yes [provider]  ?EPINEPHrine  (EPIPEN JR) 0.15 MG/0.3ML injection Inject 0.15 mg into the muscle as needed for anaphylaxis.   Yes [provider]  ?fluticasone (FLONASE) 50 MCG/ACT nasal spray Place 2 sprays into both no

## 2021-11-15 ENCOUNTER — Encounter (HOSPITAL_COMMUNITY): Payer: Self-pay | Admitting: Cardiovascular Disease

## 2021-11-15 LAB — LIPOPROTEIN A (LPA): Lipoprotein (a): 239 nmol/L — ABNORMAL HIGH (ref <75.0)

## 2021-11-15 MED ORDER — AMLODIPINE BESYLATE 5 MG PO TABS
5.0000 mg | ORAL_TABLET | Freq: Every day | ORAL | Status: DC
  Administered 2021-11-15 – 2021-11-16 (×2): 5 mg via ORAL
  Filled 2021-11-15 (×2): qty 1

## 2021-11-15 MED ORDER — METOPROLOL SUCCINATE ER 50 MG PO TB24
50.0000 mg | ORAL_TABLET | Freq: Every day | ORAL | Status: DC
  Administered 2021-11-15 – 2021-11-16 (×2): 50 mg via ORAL
  Filled 2021-11-15 (×2): qty 1

## 2021-11-15 MED ORDER — ISOSORBIDE MONONITRATE ER 30 MG PO TB24
30.0000 mg | ORAL_TABLET | Freq: Every day | ORAL | Status: DC
  Administered 2021-11-15 – 2021-11-16 (×2): 30 mg via ORAL
  Filled 2021-11-15 (×2): qty 1

## 2021-11-15 NOTE — Progress Notes (Signed)
? ?Progress Note ? ?Patient Name: [name] ?Date of Encounter: 11/15/2021 ? ?[facility] Cardiologist: Rozann Lesches, MD  ? ?Subjective  ? ?Pt denies CP or dyspnea ? ?Inpatient Medications  ?  ?Scheduled Meds: ? aspirin EC  81 mg Oral Daily  ? DULoxetine  20 mg Oral QHS  ? enoxaparin (LOVENOX) injection  40 mg Subcutaneous Q24H  ? levothyroxine  25 mcg Oral QAC breakfast  ? melatonin  3 mg Oral QHS  ? metoprolol succinate  25 mg Oral Daily  ? omega-3 acid ethyl esters  1,000 mg Oral Daily  ? pantoprazole  40 mg Oral Daily  ? rosuvastatin  10 mg Oral QHS  ? sertraline  50 mg Oral Daily  ? sodium chloride flush  3 mL Intravenous Q12H  ? triamterene-hydrochlorothiazide  0.5 tablet Oral q morning  ? [START ON 11/18/2021] Vitamin D (Ergocalciferol)  50,000 Units Oral Q Mon  ? ?Continuous Infusions: ? sodium chloride    ? nitroGLYCERIN 5 mcg/min (11/14/21 1837)  ? ?PRN Meds: ?sodium chloride, acetaminophen, albuterol, diazepam, fluticasone, nitroGLYCERIN, ondansetron, oxyCODONE-acetaminophen, sodium chloride flush  ? ?Vital Signs  ?  ?Vitals:  ? 11/14/21 1330 11/14/21 1333 11/14/21 2037 11/15/21 0524  ?BP:  (!) 122/48 (!) 153/50 (!) 182/50  ?Pulse: 66 71 70 70  ?Resp: '18 18 18 18  '$ ?Temp:  98 ?F (36.7 ?C) 98.5 ?F (36.9 ?C) 97.9 ?F (36.6 ?C)  ?TempSrc:  Oral Oral Oral  ?SpO2:  95% 97% 94%  ?Weight:      ?Height:      ? ? ?Intake/Output Summary (Last 24 hours) at 11/15/2021 0837 ?Last data filed at 11/15/2021 0540 ?Gross per 24 hour  ?Intake 75.77 ml  ?Output 1000 ml  ?Net -924.23 ml  ? ? ? ?  11/13/2021  ? 12:07 PM 11/11/2021  ?  1:15 PM 10/28/2021  ? 12:13 PM  ?Last 3 Weights  ?Weight (lbs) 162 lb 165 lb 162 lb  ?Weight (kg) 73.483 kg 74.844 kg 73.483 kg  ?   ? ?Telemetry  ?  ?Sinus - Personally Reviewed ? ?Physical Exam  ? ?GEN: NAD ?Neck: supple ?Cardiac: RRR ?Respiratory: CTA ?GI: Soft, NT/ND ?MS: No edema ?Neuro:  Grossly intact ?Psych: Normal affect  ? ?Labs  ?  ? ?Chemistry ?Recent Labs  ?Lab 11/11/21 ?1439  11/13/21 ?1241 11/14/21 ?0335  ?NA 129* 135  --   ?K 4.7 4.7  --   ?CL 93* 99  --   ?CO2 27 26  --   ?GLUCOSE 90 94  --   ?BUN 17 12  --   ?CREATININE 0.97 0.97 1.17*  ?CALCIUM 9.3 8.9  --   ?GFRNONAA >60 >60 50*  ?ANIONGAP 9 10  --   ? ?  ? ?Hematology ?Recent Labs  ?Lab 11/11/21 ?1439 11/14/21 ?0335  ?WBC 8.6 4.6  ?RBC 4.28 3.84*  ?HGB 13.4 12.1  ?HCT 39.0 35.5*  ?MCV 91.1 92.4  ?MCH 31.3 31.5  ?MCHC 34.4 34.1  ?RDW 12.6 13.0  ?PLT 207 187  ? ? ? ?Radiology  ?  ?CT CHEST W CONTRAST ? ?Result Date: 11/14/2021 ?CLINICAL DATA:  A 71 year old female presents for evaluation of thoracic aortic disease. EXAM: CT CHEST WITH CONTRAST TECHNIQUE: Multidetector CT imaging of the chest was performed during intravenous contrast administration. RADIATION DOSE REDUCTION: This exam was performed according to the departmental dose-optimization program which includes automated exposure control, adjustment of the mA and/or kV according to patient size and/or use of iterative reconstruction technique.  CONTRAST:  57m OMNIPAQUE IOHEXOL 300 MG/ML  SOLN COMPARISON:  No recent comparison imaging of the chest. FINDINGS: Cardiovascular: Calcified and noncalcified aortic atherosclerotic plaque. Mild ascending aortic atherosclerosis with calcification and noncalcified plaque with moderate involvement of the thoracic aortic arch. Abundant calcified and noncalcified plaque within branch vessels. Heart size mildly enlarged no pericardial effusion. Central pulmonary vessels are unremarkable on venous phase. Mediastinum/Nodes: 17 mm RIGHT paratracheal lymph node. Mild fullness of RIGHT hilar nodal tissue without frank adenopathy in the RIGHT hilum lymph nodes ranging between 7 mm and 1 cm (image 63/3) Subcarinal nodal enlargement 11 mm (image 63/3) Pre-vascular lymph nodes without pathologic enlargement. No thoracic inlet lymphadenopathy. No axillary lymphadenopathy. Esophagus grossly normal. Lungs/Pleura: Mild subpleural reticulation and septal  thickening without bronchiectasis or honeycombing. No specific gradient noted. Indistinct LEFT upper lobe nodule (image 37/7) 4 mm. Subtle subpleural nodularity in the RIGHT upper lobe amidst areas of subpleural reticulation. No sign of consolidation. No pleural effusion. Airways are patent. Upper Abdomen: Incidental imaging of upper abdominal contents shows no acute process. Musculoskeletal: No acute bone finding. No destructive bone process. Spinal degenerative changes. IMPRESSION: 1. Mild generalized enlargement of mediastinal lymph nodes and with 17 mm RIGHT paratracheal lymph node. Findings while nonspecific are mildly suspicious for neoplasm. Consider short interval follow-up in 8-12 weeks or PET CT. Comparison with prior imaging could also be helpful if available to guide further management. 2. 4 mm LEFT upper lobe nodule. Scattered generalized subpleural nodularity in the upper lobe on the RIGHT also noted. Respiratory bronchiolitis is considered but would consider short interval follow-up in 3-6 months. Given presence of septal thickening and subpleural reticulation would consider correlation with any respiratory symptoms that would suggest developing interstitial lung disease with pulmonary consultation and high-resolution chest CT as warranted. 3. Aortic atherosclerosis and coronary artery disease. Aortic Atherosclerosis (ICD10-I70.0). Electronically Signed   By: GZetta BillsM.D.   On: 11/14/2021 11:29  ? ?CARDIAC CATHETERIZATION ? ?Result Date: 11/13/2021 ?  Ost LM to Mid LM lesion is 90% stenosed.   Mid Cx lesion is 20% stenosed.   Mid Cx to Dist Cx lesion is 70% stenosed.   Prox Cx lesion is 60% stenosed.   Ost RCA to Prox RCA lesion is 20% stenosed.   Prox RCA to Mid RCA lesion is 30% stenosed.   RPDA lesion is 90% stenosed.   Prox RCA lesion is 60% stenosed.   RPAV lesion is 60% stenosed. 1.  Severe calcified ostial left main stenosis with significant pressure dampening with catheter engagement.   Patent left circumflex disease with mild in-stent restenosis and progression of native vessel disease in the left circumflex.  Patent RCA stents with diffuse disease in multiple areas with subtotal occlusion of the right PDA with well-developed left-to-right collaterals. 2.  Left ventricular angiography was not performed.  EF was normal by echo.  Severely elevated blood pressure with moderately elevated left ventricular end-diastolic pressure and no significant gradient across the aortic valve. Recommendations: Due to severe left main disease and patient's symptoms, recommend hospital admission for CABG evaluation.  The RCA might not have good targets for CABG but the right PDA has excellent collaterals from the left.   ? ? ?Patient Profile  ?   ?71y.o. female with past medical history of coronary artery disease, hypertension, hyperlipidemia, fibromyalgia, renal artery stenosis admitted following PCI.  Echocardiogram March 2023 showed normal LV function, grade 1 diastolic dysfunction, mild mitral regurgitation.  Carotid Dopplers March 2023 showed 80 to 99%  right and 1 to 39% left stenosis.  CTA April 2023 showed moderate stenosis in the right subclavian, severe stenosis of the left subclavian at 70%, high-grade stenosis in the right common carotid artery, severe stenosis at the origin of the left common carotid artery estimated at 75% followed by 80% in the common carotid artery.  Cardiac catheterization yesterday showed a 90% ostial left main, 70% circumflex, 90% PDA and 60% right coronary artery.  Patient admitted for consideration of coronary artery bypass and graft. ? ?Assessment & Plan  ?  ?1 CAD-patient has severe coronary disease including left main.  I have reviewed the patient with Dr. Cyndia Bent.  She is not a surgical candidate due to severely calcified plaque involving the aortic root, entire arch and extending into the great vessels.  There is also calcification in the ascending aorta which precludes  cannulation for bypass or crossclamping the aorta.  Off-pump LIMA to the LAD is also not possible as she has subclavian stenosis.  I have discussed the patient with Dr. Burt Knack and Dr. Irish Lack; Dr. Tamala Julian and Dr. Maxine Glenn

## 2021-11-15 NOTE — Care Management Important Message (Signed)
Important Message ? ?Patient Details  ?Name: [name] ?MRN: [id number] ?Date of Birth: [birth year] ? ? ?Medicare Important Message Given:  Yes ? ? ? ? ?Shelda Altes ?11/15/2021, 10:50 AM ?

## 2021-11-15 NOTE — Progress Notes (Signed)
[facility] ? ?Subjective  - No complaints. ? ? ?Objective ?(!) 182/50 ?70 ?97.9 ?F (36.6 ?C) (Oral) ?18 ?94% ? ?Intake/Output Summary (Last 24 hours) at 11/15/2021 0714 ?Last data filed at 11/15/2021 0540 ?Gross per 24 hour  ?Intake 75.77 ml  ?Output 1000 ml  ?Net -924.23 ml  ? ? ?Grossly neurologically intact ? ?Laboratory ?Lab Results: ?Recent Labs  ?  11/14/21 ?0335  ?WBC 4.6  ?HGB 12.1  ?HCT 35.5*  ?PLT 187  ? ?BMET ?Recent Labs  ?  11/13/21 ?1241 11/14/21 ?0335  ?NA 135  --   ?K 4.7  --   ?CL 99  --   ?CO2 26  --   ?GLUCOSE 94  --   ?BUN 12  --   ?CREATININE 0.97 1.17*  ?CALCIUM 8.9  --   ? ? ?COAG ?Lab Results  ?Component Value Date  ? INR 0.92 02/06/2014  ? INR 1.03 04/17/2012  ? INR 1.0 02/26/2009  ? ?No results found for: PTT ? ?Assessment/Planning: ? ? ?71 year old female that vascular surgery was consulted for carotid artery disease ? ?Her presentation is somewhat complex given she previously had a left carotid endarterectomy in 2010 with Dr. Trula Slade.  She was seen in follow-up by Dr. Donnetta Hutching recently with severe high grade right carotid stenosis  and sent back to see Dr. Trula Slade for her carotid disease after CTA neck was ordered.  I have reviewed her CT that shows severe stenosis at the right carotid bifurcation with extensive common carotid disease.  Dr. Trula Slade thought the best option was likely right carotid endarterectomy with retrograde stenting if necessary.  I agree likely not candidate for TCAR.  She also has significant restenosis of the left mid common carotid artery and Dr. Trula Slade felt this could be addressed after intervention on the right side.  She was sent for cardiac clearance.  After cardiac cath she was admitted for CABG work-up. Dr. Cyndia Bent with CT surgery is favoring high risk PCI given she has a very calcified aorta.  If cardiology feels she is a candidate for high risk PCI would favor cardiac intervention first.  I will make Dr. Trula Slade aware of her  admission. ? ?Marty Heck ?11/15/2021 ?7:14 AM ?-- ? ? ?

## 2021-11-16 ENCOUNTER — Encounter (HOSPITAL_COMMUNITY): Payer: Self-pay | Admitting: Cardiovascular Disease

## 2021-11-16 DIAGNOSIS — R9389 Abnormal findings on diagnostic imaging of other specified body structures: Secondary | ICD-10-CM

## 2021-11-16 DIAGNOSIS — Z720 Tobacco use: Secondary | ICD-10-CM

## 2021-11-16 MED ORDER — ISOSORBIDE MONONITRATE ER 30 MG PO TB24: 30.0000 mg | ORAL_TABLET | Freq: Every day | ORAL | 5 refills | Status: DC

## 2021-11-16 MED ORDER — METOPROLOL SUCCINATE ER 50 MG PO TB24
50.0000 mg | ORAL_TABLET | Freq: Every day | ORAL | 5 refills | Status: DC
Start: 2021-11-16 — End: 2022-04-18

## 2021-11-16 MED ORDER — ROSUVASTATIN CALCIUM 40 MG PO TABS: 40.0000 mg | ORAL_TABLET | Freq: Every day | ORAL | 5 refills | Status: DC

## 2021-11-16 MED ORDER — AMLODIPINE BESYLATE 5 MG PO TABS: 5.0000 mg | ORAL_TABLET | Freq: Every day | ORAL | 5 refills | Status: DC

## 2021-11-16 NOTE — Progress Notes (Signed)
Pt is all set for discharge, Waiting on attending MD to fill a prescription void. All questions and concerns addressed. Pt is discharge lounge appropriate. Awaiting on son and daughter for transport. ?

## 2021-11-16 NOTE — Progress Notes (Signed)
? ?Progress Note ? ?Patient Name: [name] ?Date of Encounter: 11/16/2021 ? ?[facility] Cardiologist: Rozann Lesches, MD  ? ?Subjective  ? ?No CP or dyspnea ? ?Inpatient Medications  ?  ?Scheduled Meds: ? amLODipine  5 mg Oral Daily  ? aspirin EC  81 mg Oral Daily  ? DULoxetine  20 mg Oral QHS  ? enoxaparin (LOVENOX) injection  40 mg Subcutaneous Q24H  ? isosorbide mononitrate  30 mg Oral Daily  ? levothyroxine  25 mcg Oral QAC breakfast  ? melatonin  3 mg Oral QHS  ? metoprolol succinate  50 mg Oral Daily  ? omega-3 acid ethyl esters  1,000 mg Oral Daily  ? pantoprazole  40 mg Oral Daily  ? rosuvastatin  10 mg Oral QHS  ? sertraline  50 mg Oral Daily  ? sodium chloride flush  3 mL Intravenous Q12H  ? triamterene-hydrochlorothiazide  0.5 tablet Oral q morning  ? [START ON 11/18/2021] Vitamin D (Ergocalciferol)  50,000 Units Oral Q Mon  ? ?Continuous Infusions: ? sodium chloride    ? ?PRN Meds: ?sodium chloride, acetaminophen, albuterol, diazepam, fluticasone, nitroGLYCERIN, ondansetron, oxyCODONE-acetaminophen, sodium chloride flush  ? ?Vital Signs  ?  ?Vitals:  ? 11/15/21 0953 11/15/21 1248 11/15/21 2005 11/16/21 0555  ?BP: (!) 119/50 (!) 117/52 (!) 123/47 (!) 167/42  ?Pulse:  64 60 62  ?Resp:  '18 18 16  '$ ?Temp:  98.4 ?F (36.9 ?C) 97.8 ?F (36.6 ?C) 98.2 ?F (36.8 ?C)  ?TempSrc:  Axillary Oral Oral  ?SpO2:  92% 93% 92%  ?Weight:      ?Height:      ? ? ?Intake/Output Summary (Last 24 hours) at 11/16/2021 7793 ?Last data filed at 11/15/2021 2130 ?Gross per 24 hour  ?Intake 480 ml  ?Output --  ?Net 480 ml  ? ? ? ?  11/13/2021  ? 12:07 PM 11/11/2021  ?  1:15 PM 10/28/2021  ? 12:13 PM  ?Last 3 Weights  ?Weight (lbs) 162 lb 165 lb 162 lb  ?Weight (kg) 73.483 kg 74.844 kg 73.483 kg  ?   ? ?Telemetry  ?  ?Sinus - Personally Reviewed ? ?Physical Exam  ? ?GEN: NAD WD ?Neck: supple, no JVD ?Cardiac: RRR, no gallop ?Respiratory: CTA; no wheeze ?GI: Soft, NT/ND, no masses ?MS: No edema ?Neuro:  No focal findings ?Psych: Normal  affect  ? ?Labs  ?  ? ?Chemistry ?Recent Labs  ?Lab 11/11/21 ?1439 11/13/21 ?1241 11/14/21 ?0335  ?NA 129* 135  --   ?K 4.7 4.7  --   ?CL 93* 99  --   ?CO2 27 26  --   ?GLUCOSE 90 94  --   ?BUN 17 12  --   ?CREATININE 0.97 0.97 1.17*  ?CALCIUM 9.3 8.9  --   ?GFRNONAA >60 >60 50*  ?ANIONGAP 9 10  --   ? ?  ? ?Hematology ?Recent Labs  ?Lab 11/11/21 ?1439 11/14/21 ?0335  ?WBC 8.6 4.6  ?RBC 4.28 3.84*  ?HGB 13.4 12.1  ?HCT 39.0 35.5*  ?MCV 91.1 92.4  ?MCH 31.3 31.5  ?MCHC 34.4 34.1  ?RDW 12.6 13.0  ?PLT 207 187  ? ? ? ?Radiology  ?  ?CT CHEST W CONTRAST ? ?Result Date: 11/14/2021 ?CLINICAL DATA:  A 71 year old female presents for evaluation of thoracic aortic disease. EXAM: CT CHEST WITH CONTRAST TECHNIQUE: Multidetector CT imaging of the chest was performed during intravenous contrast administration. RADIATION DOSE REDUCTION: This exam was performed according to the departmental dose-optimization program which includes  automated exposure control, adjustment of the mA and/or kV according to patient size and/or use of iterative reconstruction technique. CONTRAST:  15m OMNIPAQUE IOHEXOL 300 MG/ML  SOLN COMPARISON:  No recent comparison imaging of the chest. FINDINGS: Cardiovascular: Calcified and noncalcified aortic atherosclerotic plaque. Mild ascending aortic atherosclerosis with calcification and noncalcified plaque with moderate involvement of the thoracic aortic arch. Abundant calcified and noncalcified plaque within branch vessels. Heart size mildly enlarged no pericardial effusion. Central pulmonary vessels are unremarkable on venous phase. Mediastinum/Nodes: 17 mm RIGHT paratracheal lymph node. Mild fullness of RIGHT hilar nodal tissue without frank adenopathy in the RIGHT hilum lymph nodes ranging between 7 mm and 1 cm (image 63/3) Subcarinal nodal enlargement 11 mm (image 63/3) Pre-vascular lymph nodes without pathologic enlargement. No thoracic inlet lymphadenopathy. No axillary lymphadenopathy. Esophagus  grossly normal. Lungs/Pleura: Mild subpleural reticulation and septal thickening without bronchiectasis or honeycombing. No specific gradient noted. Indistinct LEFT upper lobe nodule (image 37/7) 4 mm. Subtle subpleural nodularity in the RIGHT upper lobe amidst areas of subpleural reticulation. No sign of consolidation. No pleural effusion. Airways are patent. Upper Abdomen: Incidental imaging of upper abdominal contents shows no acute process. Musculoskeletal: No acute bone finding. No destructive bone process. Spinal degenerative changes. IMPRESSION: 1. Mild generalized enlargement of mediastinal lymph nodes and with 17 mm RIGHT paratracheal lymph node. Findings while nonspecific are mildly suspicious for neoplasm. Consider short interval follow-up in 8-12 weeks or PET CT. Comparison with prior imaging could also be helpful if available to guide further management. 2. 4 mm LEFT upper lobe nodule. Scattered generalized subpleural nodularity in the upper lobe on the RIGHT also noted. Respiratory bronchiolitis is considered but would consider short interval follow-up in 3-6 months. Given presence of septal thickening and subpleural reticulation would consider correlation with any respiratory symptoms that would suggest developing interstitial lung disease with pulmonary consultation and high-resolution chest CT as warranted. 3. Aortic atherosclerosis and coronary artery disease. Aortic Atherosclerosis (ICD10-I70.0). Electronically Signed   By: GZetta BillsM.D.   On: 11/14/2021 11:29   ? ? ?Patient Profile  ?   ?71y.o. female with past medical history of coronary artery disease, hypertension, hyperlipidemia, fibromyalgia, renal artery stenosis admitted following PCI.  Echocardiogram March 2023 showed normal LV function, grade 1 diastolic dysfunction, mild mitral regurgitation.  Carotid Dopplers March 2023 showed 80 to 99% right and 1 to 39% left stenosis.  CTA April 2023 showed moderate stenosis in the right  subclavian, severe stenosis of the left subclavian at 70%, high-grade stenosis in the right common carotid artery, severe stenosis at the origin of the left common carotid artery estimated at 75% followed by 80% in the common carotid artery.  Cardiac catheterization yesterday showed a 90% ostial left main, 70% circumflex, 90% PDA and 60% right coronary artery.  Patient admitted for consideration of coronary artery bypass and graft. ? ?Assessment & Plan  ?  ?1 CAD-patient has severe coronary disease including left main.  I previously reviewed the patient with Dr. BCyndia Bent  She is not a surgical candidate due to severely calcified plaque involving the aortic root, entire arch and extending into the great vessels.  There is also calcification in the ascending aorta which precludes cannulation for bypass or crossclamping the aorta.  Off-pump LIMA to the LAD is also not possible as she has subclavian stenosis.  I have discussed the patient with Dr. CBurt Knackand Dr. VIrish Lack Dr. STamala Julianand Dr. AFletcher Anonhave also reviewed the films.  PCI would be very high  risk.  Therefore plan is medical therapy at this point.  If she has symptoms despite medical therapy we will reconsider high risk PCI.  Options are not good for any approach.  She remains pain-free.  Continue aspirin, isosorbide, present dose of Toprol, amlodipine and statin.   ? ?2 carotid artery disease-we will continue aspirin and statin.  Given left main disease I do not think she would be a good candidate for carotid endarterectomy due to cardiac risk. ? ?3 hyperlipidemia-continue higher dose Crestor at 40 mg daily.  Check lipids and liver in 8 weeks. ? ?4 hypertension-blood pressure improved this morning.  Continue present medications and follow. ? ?5 tobacco abuse-needs to discontinue. ? ?6 Abnormal chest CT-adenopathy noted.  Will need follow-up chest CT in 3 months. ? ?Patient ambulated yesterday with no chest pain.  We will discharge today on present medications.  We  will arrange follow-up in [place] with APP in 1 to 2 weeks.  Follow-up with Dr. Domenic Polite in 3 months. ?Greater than 30 minutes PA and physician time. ?D2 ? ?For questions or updates, please contact [facility]

## 2021-11-16 NOTE — Discharge Summary (Addendum)
?Discharge Summary  ?  ?Patient ID: [name] ?MRN: [id number]; DOB: [birth year] ? ?Admit date: 11/13/2021 ?Discharge date: 11/16/2021 ? ?PCP:  Glenda Chroman, MD ?  ?[facility] Providers ?Cardiologist:  Rozann Lesches, MD      ? ? ?Discharge Diagnoses  ?  ?Principal Problem: ?  Unstable angina ([place]) ?Active Problems: ?  Hyperlipidemia ?  Essential hypertension ?  CAD (coronary artery disease) ?  Carotid artery stenosis ?  Abnormal nuclear cardiac imaging test ?  Tobacco abuse ?  Abnormal chest CT ? ? ? ?Diagnostic Studies/Procedures  ?  ?Cardiac Cath 11/13/21 ?  Ost LM to Mid LM lesion is 90% stenosed. ?  Mid Cx lesion is 20% stenosed. ?  Mid Cx to Dist Cx lesion is 70% stenosed. ?  Prox Cx lesion is 60% stenosed. ?  Ost RCA to Prox RCA lesion is 20% stenosed. ?  Prox RCA to Mid RCA lesion is 30% stenosed. ?  RPDA lesion is 90% stenosed. ?  Prox RCA lesion is 60% stenosed. ?  RPAV lesion is 60% stenosed. ?  ?1.  Severe calcified ostial left main stenosis with significant pressure dampening with catheter engagement.  Patent left circumflex disease with mild in-stent restenosis and progression of native vessel disease in the left circumflex.  Patent RCA stents with diffuse disease in multiple areas with subtotal occlusion of the right PDA with well-developed left-to-right collaterals. ?2.  Left ventricular angiography was not performed.  EF was normal by echo.  Severely elevated blood pressure with moderately elevated left ventricular end-diastolic pressure and no significant gradient across the aortic valve. ?  ?Recommendations: ?Due to severe left main disease and patient's symptoms, recommend hospital admission for CABG evaluation.  The RCA might not have good targets for CABG but the right PDA has excellent collaterals from the left. ?_____________ ?  ?History of Present Illness   ?  ?[name] is a 71 y.o. female with CAD s/p DES to RCA and Cx in 10/2008, carotid artery stenosis s/p prior L RCEA with  progressive RICA stenosis, anxiety, RA/OA, depression, HTN, fibromyalgia, GERD, HLD, hiatal hernia, RLS, spondylosis, RAS s/p L renal stent in 2013 who was admitted post-cath given findings of severe LM disease and angina.  ? ?She was seen in 08/2021 with chest pain and SOB worrisome for angina. Lexiscan showed ischemia in the RCA and possibly Cx area. Imdur was started with a plan for close follow-up to discuss cath if symptoms escalated. 2D echo 09/19/21 for murmur showed EF 60-65%, g1DD, mild MR, mild calcification of AV. Carotid duplex 09/19/21 showed severe RICA stenosis and mild LICA stenosis with recommendation to f/u vascular. She was seen in follow-up as outpatient with progressive angina and cardiac cath was arranged. ? ?Hospital Course  ?   ?1. CAD with unstable angina and progressive 3V CAD ?- cardiac cath outlined above with severe LM disease, multivessel coronary disease, admitted to hospital for CABG evaluation by CVTS ?- per recommendations, the team reviewed the patient with Dr. Cyndia Bent and several interventionalists. She was not felt to be a surgical candidate due to severely calcified plaque involving the aortic root, entire arch and extending into the great vessels.  There was also calcification in the ascending aorta which precludes cannulation for bypass or crossclamping the aorta.  Off-pump LIMA to the LAD was felt not possible as she has subclavian stenosis. PCI would be very high risk.  Therefore plan is medical therapy at this point.  If she has  symptoms despite medical therapy we will reconsider high risk PCI ? ?2. Carotid artery disease ?- she was seen by vascular surgery Dr. Carlis Abbott who commented, "Her presentation is somewhat complex given she previously had a left carotid endarterectomy in 2010 with Dr. Trula Slade.  She was seen in follow-up by Dr. Donnetta Hutching recently with severe high grade right carotid stenosis  and sent back to see Dr. Trula Slade for her carotid disease after CTA neck was ordered.  I  have reviewed her CT that shows severe stenosis at the right carotid bifurcation with extensive common carotid disease.  Dr. Trula Slade thought the best option was likely right carotid endarterectomy with retrograde stenting if necessary.  I agree likely not candidate for TCAR.  She also has significant restenosis of the left mid common carotid artery and Dr. Trula Slade felt this could be addressed after intervention on the right side.  She was sent for cardiac clearance.  After cardiac cath she was admitted for CABG work-up. Dr. Cyndia Bent with CT surgery is favoring high risk PCI given she has a very calcified aorta.  If cardiology feels she is a candidate for high risk PCI would favor cardiac intervention first" ?- Dr. Stanford Breed states he did not think she would be a good candidate for carotid endarterectomy due to cardiac risk, he recommended continued OP f/u ?- will send a staff msg to Dr. Earnie Larsson at [facility] requesting follow-up ? ?3. Hyperlipidemia ?- rosuvastatin titrated to '40mg'$  daily this admission  ?- If the patient is tolerating statin at time of follow-up appointment, would consider rechecking liver function/lipid panel in 6-8 weeks ? ?4. Essential HTN ?- managed in context of the above, controlled today ? ?5. Tobacco abuse ?- cessation advised ? ?6. Abnormal chest CT  ?- chest CT 11/14/21: "1. Mild generalized enlargement of mediastinal lymph nodes and with 17 mm RIGHT paratracheal lymph node. Findings while nonspecific are ?mildly suspicious for neoplasm. Consider short interval follow-up in 8-12 weeks or PET CT. Comparison with prior imaging could also be helpful if available to guide further management. 2. 4 mm LEFT upper lobe nodule. Scattered generalized subpleural nodularity in the upper lobe on the RIGHT also noted. Respiratory bronchiolitis is considered but would consider short interval follow-up in 3-6 months. Given presence of septal thickening and subpleural reticulation would consider correlation with  any respiratory symptoms that would suggest developing interstitial lung disease with pulmonary consultation and high-resolution chest CT as warranted." ?- discussed personally with patient today  ?- Dr. Stanford Breed recommended follow-up CT in 3 months - will need to be addressed at OP follow-up ? ?Dr. Stanford Breed has seen and examined the patient today and feels he is stable for discharge. Will send staff msg to scheduling team to arrange f/u with APP in 1-2 weeks and Dr. Domenic Polite in 3 months. ? ?Did the patient have an acute coronary syndrome (MI, NSTEMI, STEMI, etc) this admission?:  No                               ?Did the patient have a percutaneous coronary intervention (stent / angioplasty)?:  No.   ? ?   ? ?The patient will be scheduled for a TOC follow up appointment in 7-14 days.  A message has been sent to the [facility] and Scheduling Pool at the office where the patient should be seen for follow up.  ?_____________ ? ?Discharge Vitals ?Blood pressure (!) 167/42, pulse 62, temperature 98.2 ?F (  36.8 ?C), temperature source Oral, resp. rate 16, height '4\' 11"'$  (1.499 m), weight 73.5 kg, SpO2 92 %.  ?Filed Weights  ? 11/13/21 1207  ?Weight: 73.5 kg  ? ? ?Labs & Radiologic Studies  ?  ?CBC ?Recent Labs  ?  11/14/21 ?0335  ?WBC 4.6  ?HGB 12.1  ?HCT 35.5*  ?MCV 92.4  ?PLT 187  ? ?Basic Metabolic Panel ?Recent Labs  ?  11/13/21 ?1241 11/14/21 ?0335  ?NA 135  --   ?K 4.7  --   ?CL 99  --   ?CO2 26  --   ?GLUCOSE 94  --   ?BUN 12  --   ?CREATININE 0.97 1.17*  ?CALCIUM 8.9  --   ? ? ?_____________  ?CT ANGIO NECK W OR WO CONTRAST ? ?Result Date: 10/21/2021 ?CLINICAL DATA:  Right carotid artery stenosis EXAM: CT ANGIOGRAPHY NECK TECHNIQUE: Multidetector CT imaging of the neck was performed using the standard protocol during bolus administration of intravenous contrast. Multiplanar CT image reconstructions and MIPs were obtained to evaluate the vascular anatomy. Carotid stenosis measurements (when applicable) are obtained  utilizing NASCET criteria, using the distal internal carotid diameter as the denominator. RADIATION DOSE REDUCTION: This exam was performed according to the departmental dose-optimization program which includ

## 2021-11-16 NOTE — Progress Notes (Signed)
? ? ?  Subjective  -  ? ?No neurologic symptoms ? ? ?Physical Exam: ? ?Neuro exam is nonfocal ?Respirations nonlabored ?Abdomen soft ? ? ? ? ?Assessment/Plan:   ? ?The patient has known bilateral carotid disease.  I sent her for cardiac clearance prior to intervention.  She underwent cardiac catheterization.  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  She was also turned down by cardiology for high risk PCI.  Medical management has been recommended.  Because of her high risk cardiac status, coupled with the fact that she is asymptomatic from her carotid disease at this point I would favor medical management of her carotid artery stenosis unless she develops symptoms.  She will follow-up with me in the office in 3 months with a carotid duplex. ? ?[name] ?11/16/2021 ?12:48 PM ?-- ? ?Vitals:  ? 11/16/21 0555 11/16/21 1107  ?BP: (!) 167/42 116/62  ?Pulse: 62 66  ?Resp: 16 18  ?Temp: 98.2 ?F (36.8 ?C) 98.7 ?F (37.1 ?C)  ?SpO2: 92% 90%  ? ? ?Intake/Output Summary (Last 24 hours) at 11/16/2021 1248 ?Last data filed at 11/15/2021 2130 ?Gross per 24 hour  ?Intake 480 ml  ?Output --  ?Net 480 ml  ? ? ? ?Laboratory ?CBC ?   ?Component Value Date/Time  ? WBC 4.6 11/14/2021 0335  ? HGB 12.1 11/14/2021 0335  ? HCT 35.5 (L) 11/14/2021 0335  ? PLT 187 11/14/2021 0335  ? ? ?BMET ?   ?Component Value Date/Time  ? NA 135 11/13/2021 1241  ? K 4.7 11/13/2021 1241  ? CL 99 11/13/2021 1241  ? CO2 26 11/13/2021 1241  ? GLUCOSE 94 11/13/2021 1241  ? BUN 12 11/13/2021 1241  ? CREATININE 1.17 (H) 11/14/2021 0335  ? CALCIUM 8.9 11/13/2021 1241  ? GFRNONAA 50 (L) 11/14/2021 0335  ? GFRAA >60 03/12/2020 0641  ? ? ?COAG ?Lab Results  ?Component Value Date  ? INR 0.92 02/06/2014  ? INR 1.03 04/17/2012  ? INR 1.0 02/26/2009  ? ?No results found for: PTT ? ?Antibiotics ?Anti-infectives (From admission, onward)  ? ? None  ? ?  ? ? ? ?V. Leia Alf, M.D., FACS ?[facility] ?Office: [phone] ?Pager:  [phone]  ?

## 2021-11-19 ENCOUNTER — Telehealth: Payer: Self-pay | Admitting: Cardiology

## 2021-11-19 NOTE — Telephone Encounter (Signed)
Appointment Type: 1-2 weeks is TOC visit, 3 month follow-up is post-hospital  ? ?Thank you for all you do!  ? ?Dayna Dunn PA-C  ? ?(Received on 5/6 ) ?

## 2021-11-19 NOTE — Progress Notes (Signed)
? ?Cardiology Office Note   ? ?Date:  11/27/2021  ? ?ID:  [name], DOB [birth year], MRN [id number] ? ? ?PCP:  Glenda Chroman, MD ?  ?[place]  ?Cardiologist:  Rozann Lesches, MD   ?Advanced Practice Provider:  No care team member to display ?Electrophysiologist:  None  ? ?[id number]}  ? ?Chief Complaint  ?Patient presents with  ? Hospitalization Follow-up  ? ? ?History of Present Illness:  ?[name] is a 71 y.o. female  with CAD s/p DES to RCA and Cx in 10/2008, carotid artery stenosis s/p prior L RCEA with progressive RICA stenosis, anxiety, RA/OA, depression, HTN, fibromyalgia, GERD, HLD, hiatal hernia, RLS, spondylosis, RAS s/p L renal stent in 2013 who was admitted post-cath given findings of severe LM disease and angina.  ?  ?She was seen in 08/2021 with chest pain and SOB worrisome for angina. Lexiscan showed ischemia in the RCA and possibly Cx area. Imdur was started with a plan for close follow-up to discuss cath if symptoms escalated. 2D echo 09/19/21 for murmur showed EF 60-65%, g1DD, mild MR, mild calcification of AV. Carotid duplex 09/19/21 showed severe RICA stenosis and mild LICA stenosis with recommendation to f/u vascular.  ?  ?I saw the patient 11/11/21 follow-up as outpatient with progressive angina and cardiac cath was arranged.She was found to have  severe LM disease and multivessel disease. She was not felt to be a surgical candidate due to evere plaque in the aortic root, entire aortic arch. PCI would be very high risk so medical therapy recommended. Not felt to be a good candidate for carotid endarterectomy either. ? ?Patient comes in for f/u. She feels better. No further chest heaviness, watching diet closely. She quit smoking-using nicotine patch. Scheduled for endoscopy and colonoscopy in June-I advised her not to do. ? ? ? ?Past Medical History:  ?Diagnosis Date  ? Abnormal chest CT   ? Anxiety   ? Arthritis   ? Rhumatoid and Osteoarthritis  ? Carotid artery  disease ([place])   ? Dr. Trula Slade, s/p left CEA, severe R ICA stenosis  ? Coronary atherosclerosis of native coronary artery   ? DES RCA/circumflex 4/10, LVEF 65 -> repeat cath 11/2021 with multivessel CAD including LM stenosis, initial med rx  ? Depression   ? Essential hypertension   ? Fibromyalgia   ? GERD (gastroesophageal reflux disease)   ? Hiatal hernia   ? Hyperlipidemia   ? Kidney cysts   ? NSTEMI (non-ST elevated myocardial infarction) ([place])   ? 4/10  ? Renal artery stenosis ([place])   ? Left renal artery stent 12/13  ? Restless leg syndrome   ? Spondylosis without myelopathy   ? Tobacco abuse   ? ? ?Past Surgical History:  ?Procedure Laterality Date  ? ABDOMINAL HYSTERECTOMY  1977  ? BREAST LUMPECTOMY    ? CAROTID ENDARTERECTOMY Left 02/28/2009  ? CHOLECYSTECTOMY  1991  ? COLONOSCOPY    ? COLONOSCOPY N/A 05/08/2017  ? Procedure: COLONOSCOPY;  Surgeon: Rogene Houston, MD;  [facility];  Service: Endoscopy;  Laterality: N/A;  12:00  ? ESOPHAGOGASTRODUODENOSCOPY N/A 04/07/2013  ? Procedure: ESOPHAGOGASTRODUODENOSCOPY (EGD);  Surgeon: Rogene Houston, MD;  [facility];  Service: Endoscopy;  Laterality: N/A;  250  ? ESOPHAGOGASTRODUODENOSCOPY N/A 02/02/2014  ? Procedure: ESOPHAGOGASTRODUODENOSCOPY (EGD);  Surgeon: Rogene Houston, MD;  [facility];  Service: Endoscopy;  Laterality: N/A;  120  ? EYE SURGERY    ?  Catartact bilateral  ? JOINT REPLACEMENT  March 2013  ? Left knee  ? JOINT REPLACEMENT  2006  ? Left Knee  ? kidney stent    ? Left December 2013  ? LEFT HEART CATH AND CORONARY ANGIOGRAPHY N/A 11/13/2021  ? Procedure: LEFT HEART CATH AND CORONARY ANGIOGRAPHY;  Surgeon: Wellington Hampshire, MD;  Location: [facility];  Service: Cardiovascular;  Laterality: N/A;  ? LUMBAR DISC SURGERY    ? L5-S1  ? MULTIPLE TOOTH EXTRACTIONS  Jan. 2015  ? Neck Fusion  1996  ? C4 to C6  ? PERCUTANEOUS STENT INTERVENTION Left 06/15/2012  ? Procedure: PERCUTANEOUS STENT INTERVENTION;  Surgeon:  Serafina Mitchell, MD;  Location: [facility] CATH LAB;  Service: Cardiovascular;  Laterality: Left;  renal  ? PERIPHERAL VASCULAR BALLOON ANGIOPLASTY  05/26/2017  ? Procedure: PERIPHERAL VASCULAR BALLOON ANGIOPLASTY;  Surgeon: Serafina Mitchell, MD;  Location: [facility];  Service: Cardiovascular;;  Lt. Renal Angioplasty  ? POLYPECTOMY  05/08/2017  ? Procedure: POLYPECTOMY;  Surgeon: Rogene Houston, MD;  [facility];  Service: Endoscopy;;  colon ?  ? RENAL ANGIOGRAM N/A 06/15/2012  ? Procedure: RENAL ANGIOGRAM;  Surgeon: Serafina Mitchell, MD;  Location: [facility] CATH LAB;  Service: Cardiovascular;  Laterality: N/A;  ? RENAL ANGIOGRAPHY  05/26/2017  ? Procedure: RENAL ANGIOGRAPHY;  Surgeon: Serafina Mitchell, MD;  Location: [place] CV LAB;  Service: Cardiovascular;;  ? [place]  ? Ruptured disc  ? TONSILECTOMY, ADENOIDECTOMY, BILATERAL MYRINGOTOMY AND TUBES  1969  ? TOTAL KNEE ARTHROPLASTY    ? Left  ? TUBAL LIGATION    ? ? ?Current Medications: ?Current Meds  ?Medication Sig  ? albuterol (VENTOLIN HFA) 108 (90 Base) MCG/ACT inhaler 1 puff 4 (four) times daily as needed for wheezing or shortness of breath.  ? amLODipine (NORVASC) 5 MG tablet Take 1 tablet (5 mg total) by mouth daily.  ? aspirin 81 MG EC tablet Take 81 mg by mouth daily.  ? CALCIUM PO Take 1 tablet by mouth 2 (two) times daily. Gummy  ? cetirizine (ZYRTEC) 10 MG chewable tablet Chew 10 mg by mouth daily as needed for rhinitis.  ? DAYVIGO 5 MG TABS Take 5 mg by mouth at bedtime as needed (Sleep).  ? diazepam (VALIUM) 5 MG tablet Take 5 mg by mouth 2 (two) times daily as needed for anxiety.  ? diclofenac Sodium (VOLTAREN) 1 % GEL Apply 2 g topically 3 (three) times daily.  ? DULoxetine (CYMBALTA) 20 MG capsule Take 20 mg by mouth at bedtime.  ? EPINEPHrine (EPIPEN JR) 0.15 MG/0.3ML injection Inject 0.15 mg into the muscle as needed for anaphylaxis.  ? fluticasone (FLONASE) 50 MCG/ACT nasal spray Place 2 sprays into both nostrils daily  as needed for allergies.   ? isosorbide mononitrate (IMDUR) 30 MG 24 hr tablet Take 1 tablet (30 mg total) by mouth daily.  ? levothyroxine (SYNTHROID) 25 MCG tablet Take 25 mcg by mouth daily before breakfast.  ? Metamucil Fiber CHEW Chew 3 tablets by mouth daily.  ? metoprolol succinate (TOPROL-XL) 50 MG 24 hr tablet Take 1 tablet (50 mg total) by mouth daily.  ? Multiple Vitamins-Minerals (ALIVE WOMENS GUMMY PO) Take 1 tablet by mouth daily. gummy  ? nitroGLYCERIN (NITROSTAT) 0.4 MG SL tablet Place 0.4 mg under the tongue every 5 (five) minutes as needed for chest pain.   ? Omega-3 1000 MG CAPS Take 1,000 mg by mouth daily.  ?  omeprazole (PRILOSEC) 20 MG capsule Take 20 mg by mouth daily.  ? ondansetron (ZOFRAN) 4 MG tablet Take 4 mg by mouth 3 (three) times daily as needed for nausea.  ? oxyCODONE-acetaminophen (PERCOCET/ROXICET) 5-325 MG tablet Take 1 tablet by mouth 3 (three) times daily as needed for moderate pain or severe pain.  ? rosuvastatin (CRESTOR) 40 MG tablet Take 1 tablet (40 mg total) by mouth at bedtime.  ? sertraline (ZOLOFT) 50 MG tablet Take 50 mg by mouth daily.  ? triamterene-hydrochlorothiazide (MAXZIDE-25) 37.5-25 MG tablet Take 0.5 tablets by mouth every morning.  ? trimethoprim-polymyxin b (POLYTRIM) ophthalmic solution Place 1 drop into the right eye in the morning, at noon, in the evening, and at bedtime.  ? Turmeric 500 MG CAPS Take 500 mg by mouth daily.  ? vitamin B-12 (CYANOCOBALAMIN) 1000 MCG tablet Take 2 tablets (2,000 mcg total) by mouth daily. (Patient taking differently: Take 1,000 mcg by mouth daily.)  ? Vitamin D, Ergocalciferol, (DRISDOL) 50000 UNITS CAPS Take 50,000 Units by mouth every Monday.   ? zinc gluconate 50 MG tablet Take 50 mg by mouth daily.  ?  ? ?Allergies:   Celecoxib, Ciprofloxacin hcl, Codeine, Dicyclomine, Lamictal [lamotrigine], Metoclopramide hcl, Morphine and related, Omeprazole, Savella [milnacipran hcl], Simvastatin, Tape, Tizanidine, Trazodone and  nefazodone, Venlafaxine, Citalopram, and Sertraline hcl  ? ?Social History  ? ?Socioeconomic History  ? Marital status: Divorced  ?  Spouse name: Not on file  ? Number of children: Not on file  ? Years of ed

## 2021-11-19 NOTE — Telephone Encounter (Signed)
Called pt no answer. Left msg to call back.  

## 2021-11-20 NOTE — Telephone Encounter (Signed)
Patient contacted regarding discharge from [facility] on 11/16/21. ? ?Patient understands to follow up with provider Ermalinda Barrios, PA on 11/27/21 at 10:15 am at [facility]. ?Patient understands discharge instructions? Yes  ?Patient understands medications and regiment? Yes ? ?Patient understands to bring all medications to this visit? Yes ? ?Ask patient:  Are you enrolled in My Chart (yes or no)  If no ask patient if they would like to enroll.  ? ? ?Postop Surgical Patients: ?               What is your wound status? Any signs/ symptoms of infection (Temp, redness/ red streaks, swelling, purulent drainage, foul odor or smell)? ?             Please do not place any creams/ lotions/ or antibiotic ointment on any surgical incisions/ wounds without physician approval. ?             Do you have any questions about your medications?  All medications (except pain medications) are to be filled by your Cardiologist AFTER your first post op       appointment with them.  Are you taking your pain medication? ?             How is your pain controlled? Pain level? ?             If you require a refill on pain medications, know that the same medication/ amount may not be prescribed or a refill may not be given.  Please contact your pharmacy for refill requests.  ?             Do you have help at home with ADL's?  If you have home health, have you been contacted or seen by the agency? ?             Please refer to your Pre/post surgery booklet, there is a lot of useful information in it that may answer any questions you may have. ?             Please note that it is ok to remove your surgical dressing, shower (soap/ water), and pat the incision dry. ? ?For surgery related questions staff will route a phone note to CV DIV TCTS TOC pool ? ?[facility] ?[address] [place], [place] [id number] ? ?For nonsurgery patients please delete the surgery questions. ? ?For patients ? ? ?

## 2021-11-26 ENCOUNTER — Ambulatory Visit (INDEPENDENT_AMBULATORY_CARE_PROVIDER_SITE_OTHER): Payer: 59 | Admitting: Gastroenterology

## 2021-11-26 ENCOUNTER — Encounter (INDEPENDENT_AMBULATORY_CARE_PROVIDER_SITE_OTHER): Payer: Self-pay

## 2021-11-26 ENCOUNTER — Other Ambulatory Visit (INDEPENDENT_AMBULATORY_CARE_PROVIDER_SITE_OTHER): Payer: Self-pay

## 2021-11-26 ENCOUNTER — Encounter (INDEPENDENT_AMBULATORY_CARE_PROVIDER_SITE_OTHER): Payer: Self-pay | Admitting: Gastroenterology

## 2021-11-26 VITALS — BP 116/67 | HR 60 | Temp 97.7°F | Ht 59.0 in | Wt 162.5 lb

## 2021-11-26 DIAGNOSIS — R110 Nausea: Secondary | ICD-10-CM | POA: Diagnosis not present

## 2021-11-26 DIAGNOSIS — Z1211 Encounter for screening for malignant neoplasm of colon: Secondary | ICD-10-CM

## 2021-11-26 DIAGNOSIS — R1013 Epigastric pain: Secondary | ICD-10-CM | POA: Insufficient documentation

## 2021-11-26 DIAGNOSIS — K5900 Constipation, unspecified: Secondary | ICD-10-CM

## 2021-11-26 DIAGNOSIS — Z86010 Personal history of colonic polyps: Secondary | ICD-10-CM

## 2021-11-26 NOTE — Patient Instructions (Signed)
We will get you scheduled for EGD for nausea and upper abdominal pain as well as colonoscopy as last one did not have a good prep ?Please continue staying well hydrated with plenty of water and diet high in fruits, veggies and whole grains ?For constipation, Start taking Miralax (over the counter) 1 capful every day for one week. If bowel movements do not improve, increase to 1 capful every 12 hours. If after two weeks there is no improvement, increase to 1 capful every 8 hours ?We will check thyroid function to rule out this as a cause of constipation, however, I suspect that some of your constipation may be from chronic pain medication use. ? ?Follow up 4 months ?

## 2021-11-26 NOTE — Progress Notes (Addendum)
? ?Referring Provider: Glenda Chroman, MD ?Primary Care Physician:  Glenda Chroman, MD ?Primary GI Physician: new ? ?Chief Complaint  ?Patient presents with  ? Abdominal Pain  ?  Patient here today with concerns over lower abdominal pain. She has some issues with occasional nausea.  ? ?HPI:   ?[name] is a 71 y.o. female with past medical history of anxiety, arthritis, CAD, depression, HTN, fibromyalgia, GERD, HLD, NSTEMi, renal artery stenosis, RLS, spondylosis.  ? ?Patient presenting today as a new patient for abdominal pain.  ? ?CT A/P w wo contrast done in oct 2022 unremarkable for causes of abdominal pain. ? ?Patient states that she has been having stomach issues for about 1-1.5 months. She initially started with constipation, took dulcolax and then had watery stools. States that she does metamucil gummies, having a BM maybe every other day, usually little stool balls. She endorses straining to have a BM. Abdominal pain is improved after BM. Drinking 2-3 bottles of propel per day and unsweet tea. Does drink some non caffeinated/diet ginger ale. Diet is high in raw veggies and lean meats. Eats activia daily. She had recent hospitalization for cardiac issues and has had increase in her statin and beta blocker recently, though was having abdominal pain and constipation prior to these changes. Appetite is good, no unintentional weight loss. Does take oxycodone for arthritic pain, takes this 1-2x/day.  ? ?She does endorse frequent nausea, does not know of any precipitating factors. This has been ongoing for a while. No episodes of vomiting. Denies rectal bleeding or melena. Eating seems to improve her symptoms at times, though sometimes nausea is worse after eating. Does not seem to matter what she ate. She notes bloating to her abdomen after eating. Feels that acid reflux is well managed on omeprazole '20mg'$  daily. No dysphagia or odynophagia. Hx of cholecystectomy in 1991.  ? ?NSAID use: none ?Social hx:  recently quit smoking, no etoh  ?Fam OX:BDZHGD had stomach cancer in her 42s ? ?Last Colonoscopy:05/08/17 - Preparation of the colon was poor. ?- Two small polyps at the hepatic flexure and in the ascending colon. Tubular adenoma ?- One 4 mm polyp in the ascending colon, tubular adenoma ?Last Endoscopy:7/21/15Erosive reflux esophagitis. ?Nonerosive antral gastritis. Biopsy taken for routine histology-neg h pylori ? ?Recommendations:  ?Repeat colonoscopy recommended in oct 2019 ? ?Past Medical History:  ?Diagnosis Date  ? Abnormal chest CT   ? Anxiety   ? Arthritis   ? Rhumatoid and Osteoarthritis  ? Carotid artery disease ([place])   ? Dr. Trula Slade, s/p left CEA, severe R ICA stenosis  ? Coronary atherosclerosis of native coronary artery   ? DES RCA/circumflex 4/10, LVEF 65 -> repeat cath 11/2021 with multivessel CAD including LM stenosis, initial med rx  ? Depression   ? Essential hypertension   ? Fibromyalgia   ? GERD (gastroesophageal reflux disease)   ? Hiatal hernia   ? Hyperlipidemia   ? Kidney cysts   ? NSTEMI (non-ST elevated myocardial infarction) ([place])   ? 4/10  ? Renal artery stenosis ([place])   ? Left renal artery stent 12/13  ? Restless leg syndrome   ? Spondylosis without myelopathy   ? Tobacco abuse   ? ? ?Past Surgical History:  ?Procedure Laterality Date  ? ABDOMINAL HYSTERECTOMY  1977  ? BREAST LUMPECTOMY    ? CAROTID ENDARTERECTOMY Left 02/28/2009  ? CHOLECYSTECTOMY  1991  ? COLONOSCOPY    ? COLONOSCOPY N/A 05/08/2017  ? Procedure: COLONOSCOPY;  Surgeon: Rogene Houston, MD;  [facility];  Service: Endoscopy;  Laterality: N/A;  12:00  ? ESOPHAGOGASTRODUODENOSCOPY N/A 04/07/2013  ? Procedure: ESOPHAGOGASTRODUODENOSCOPY (EGD);  Surgeon: Rogene Houston, MD;  [facility];  Service: Endoscopy;  Laterality: N/A;  250  ? ESOPHAGOGASTRODUODENOSCOPY N/A 02/02/2014  ? Procedure: ESOPHAGOGASTRODUODENOSCOPY (EGD);  Surgeon: Rogene Houston, MD;  [facility];  Service: Endoscopy;   Laterality: N/A;  120  ? EYE SURGERY    ? Catartact bilateral  ? JOINT REPLACEMENT  March 2013  ? Left knee  ? JOINT REPLACEMENT  2006  ? Left Knee  ? kidney stent    ? Left December 2013  ? LEFT HEART CATH AND CORONARY ANGIOGRAPHY N/A 11/13/2021  ? Procedure: LEFT HEART CATH AND CORONARY ANGIOGRAPHY;  Surgeon: Wellington Hampshire, MD;  Location: [place] [facility] LAB;  Service: Cardiovascular;  Laterality: N/A;  ? LUMBAR DISC SURGERY    ? L5-S1  ? MULTIPLE TOOTH EXTRACTIONS  Jan. 2015  ? Neck Fusion  1996  ? C4 to C6  ? PERCUTANEOUS STENT INTERVENTION Left 06/15/2012  ? Procedure: PERCUTANEOUS STENT INTERVENTION;  Surgeon: Serafina Mitchell, MD;  Location: [facility] CATH LAB;  Service: Cardiovascular;  Laterality: Left;  renal  ? PERIPHERAL VASCULAR BALLOON ANGIOPLASTY  05/26/2017  ? Procedure: PERIPHERAL VASCULAR BALLOON ANGIOPLASTY;  Surgeon: Serafina Mitchell, MD;  Location: [place];  Service: Cardiovascular;;  Lt. Renal Angioplasty  ? POLYPECTOMY  05/08/2017  ? Procedure: POLYPECTOMY;  Surgeon: Rogene Houston, MD;  [facility];  Service: Endoscopy;;  colon ?  ? RENAL ANGIOGRAM N/A 06/15/2012  ? Procedure: RENAL ANGIOGRAM;  Surgeon: Serafina Mitchell, MD;  Location: [facility] CATH LAB;  Service: Cardiovascular;  Laterality: N/A;  ? RENAL ANGIOGRAPHY  05/26/2017  ? Procedure: RENAL ANGIOGRAPHY;  Surgeon: Serafina Mitchell, MD;  Location: [place] [facility] LAB;  Service: Cardiovascular;;  ? [place]  ? Ruptured disc  ? TONSILECTOMY, ADENOIDECTOMY, BILATERAL MYRINGOTOMY AND TUBES  1969  ? TOTAL KNEE ARTHROPLASTY    ? Left  ? TUBAL LIGATION    ? ? ?Current Outpatient Medications  ?Medication Sig Dispense Refill  ? albuterol (VENTOLIN HFA) 108 (90 Base) MCG/ACT inhaler 1 puff 4 (four) times daily as needed for wheezing or shortness of breath.    ? amLODipine (NORVASC) 5 MG tablet Take 1 tablet (5 mg total) by mouth daily. 30 tablet 5  ? aspirin 81 MG EC tablet Take 81 mg by mouth daily.    ? CALCIUM PO Take 1  tablet by mouth 2 (two) times daily. Gummy    ? cetirizine (ZYRTEC) 10 MG chewable tablet Chew 10 mg by mouth daily as needed for rhinitis.    ? DAYVIGO 5 MG TABS Take 5 mg by mouth at bedtime as needed (Sleep).    ? diazepam (VALIUM) 5 MG tablet Take 5 mg by mouth 2 (two) times daily as needed for anxiety.    ? diclofenac Sodium (VOLTAREN) 1 % GEL Apply 2 g topically 3 (three) times daily.    ? DULoxetine (CYMBALTA) 20 MG capsule Take 20 mg by mouth at bedtime.    ? EPINEPHrine (EPIPEN JR) 0.15 MG/0.3ML injection Inject 0.15 mg into the muscle as needed for anaphylaxis.    ? fluticasone (FLONASE) 50 MCG/ACT nasal spray Place 2 sprays into both nostrils daily as needed for allergies.     ? isosorbide mononitrate (IMDUR) 30 MG 24 hr  tablet Take 1 tablet (30 mg total) by mouth daily. 30 tablet 5  ? levothyroxine (SYNTHROID) 25 MCG tablet Take 25 mcg by mouth daily before breakfast.    ? Metamucil Fiber CHEW Chew 3 tablets by mouth daily.    ? metoprolol succinate (TOPROL-XL) 50 MG 24 hr tablet Take 1 tablet (50 mg total) by mouth daily. 30 tablet 5  ? Multiple Vitamins-Minerals (ALIVE WOMENS GUMMY PO) Take 1 tablet by mouth daily. gummy    ? nitroGLYCERIN (NITROSTAT) 0.4 MG SL tablet Place 0.4 mg under the tongue every 5 (five) minutes as needed for chest pain.     ? Omega-3 1000 MG CAPS Take 1,000 mg by mouth daily.    ? omeprazole (PRILOSEC) 20 MG capsule Take 20 mg by mouth daily.    ? ondansetron (ZOFRAN) 4 MG tablet Take 4 mg by mouth 3 (three) times daily as needed for nausea.    ? oxyCODONE-acetaminophen (PERCOCET/ROXICET) 5-325 MG tablet Take 1 tablet by mouth 3 (three) times daily as needed for moderate pain or severe pain.    ? rosuvastatin (CRESTOR) 40 MG tablet Take 1 tablet (40 mg total) by mouth at bedtime. 30 tablet 5  ? sertraline (ZOLOFT) 50 MG tablet Take 50 mg by mouth daily.    ? triamterene-hydrochlorothiazide (MAXZIDE-25) 37.5-25 MG tablet Take 0.5 tablets by mouth every morning. 30 tablet 0  ?  trimethoprim-polymyxin b (POLYTRIM) ophthalmic solution Place 1 drop into the right eye in the morning, at noon, in the evening, and at bedtime.    ? Turmeric 500 MG CAPS Take 500 mg by mouth daily.    ? vitamin

## 2021-11-27 ENCOUNTER — Encounter: Payer: Self-pay | Admitting: Physician Assistant

## 2021-11-27 ENCOUNTER — Ambulatory Visit (INDEPENDENT_AMBULATORY_CARE_PROVIDER_SITE_OTHER): Payer: 59 | Admitting: Physician Assistant

## 2021-11-27 VITALS — BP 138/62 | HR 61 | Ht 59.0 in | Wt 162.4 lb

## 2021-11-27 DIAGNOSIS — K5900 Constipation, unspecified: Secondary | ICD-10-CM

## 2021-11-27 DIAGNOSIS — I10 Essential (primary) hypertension: Secondary | ICD-10-CM

## 2021-11-27 DIAGNOSIS — E7849 Other hyperlipidemia: Secondary | ICD-10-CM

## 2021-11-27 DIAGNOSIS — R9389 Abnormal findings on diagnostic imaging of other specified body structures: Secondary | ICD-10-CM

## 2021-11-27 DIAGNOSIS — I2510 Atherosclerotic heart disease of native coronary artery without angina pectoris: Secondary | ICD-10-CM | POA: Diagnosis not present

## 2021-11-27 DIAGNOSIS — I6523 Occlusion and stenosis of bilateral carotid arteries: Secondary | ICD-10-CM | POA: Diagnosis not present

## 2021-11-27 DIAGNOSIS — Z720 Tobacco use: Secondary | ICD-10-CM

## 2021-11-27 NOTE — Patient Instructions (Signed)
Medication Instructions:  ?Your physician recommends that you continue on your current medications as directed. Please refer to the Current Medication list given to you today. ? ?*If you need a refill on your cardiac medications before your next appointment, please call your pharmacy* ? ? ?Lab Work: ?Your physician recommends that you return for lab work in: BMET  ? ?If you have labs (blood work) drawn today and your tests are completely normal, you will receive your results only by: ?MyChart Message (if you have MyChart) OR ?A paper copy in the mail ?If you have any lab test that is abnormal or we need to change your treatment, we will call you to review the results. ? ? ?Testing/Procedures: ?Chest CT  ? ? ?Follow-Up: ?At [facility], you and your health needs are our priority.  As part of our continuing mission to provide you with exceptional heart care, we have created designated Provider Care Teams.  These Care Teams include your primary Cardiologist (physician) and Advanced Practice Providers (APPs -  Physician Assistants and Nurse Practitioners) who all work together to provide you with the care you need, when you need it. ? ?We recommend signing up for the patient portal called "MyChart".  Sign up information is provided on this After Visit Summary.  MyChart is used to connect with patients for Virtual Visits (Telemedicine).  Patients are able to view lab/test results, encounter notes, upcoming appointments, etc.  Non-urgent messages can be sent to your provider as well.   ?To learn more about what you can do with MyChart, go to [email or web address].   ? ?Your next appointment:   ? As Scheduled  ? ?The format for your next appointment:   ?In Person ? ?Provider:   ?Rozann Lesches, MD  ? ? ?Other Instructions ?Thank you for choosing [facility]! ? ? ? ?Important Information About Sugar ? ? ? ? ?  ?

## 2022-01-01 ENCOUNTER — Other Ambulatory Visit (HOSPITAL_COMMUNITY): Payer: 59

## 2022-01-03 ENCOUNTER — Ambulatory Visit (HOSPITAL_COMMUNITY): Admit: 2022-01-03 | Payer: 59 | Admitting: Gastroenterology

## 2022-01-03 ENCOUNTER — Encounter (HOSPITAL_COMMUNITY): Payer: Self-pay

## 2022-01-03 SURGERY — ESOPHAGOGASTRODUODENOSCOPY (EGD) WITH PROPOFOL
Anesthesia: Monitor Anesthesia Care

## 2022-01-06 ENCOUNTER — Ambulatory Visit (HOSPITAL_COMMUNITY): Admission: RE | Admit: 2022-01-06 | Payer: 59 | Source: Ambulatory Visit

## 2022-01-13 ENCOUNTER — Ambulatory Visit (HOSPITAL_COMMUNITY)
Admission: RE | Admit: 2022-01-13 | Discharge: 2022-01-13 | Disposition: A | Discharge status: Home / Self Care | Payer: 59 | Source: Ambulatory Visit | Attending: Physician Assistant | Admitting: Physician Assistant

## 2022-01-13 ENCOUNTER — Other Ambulatory Visit (HOSPITAL_COMMUNITY)
Admission: RE | Admit: 2022-01-13 | Discharge: 2022-01-13 | Disposition: A | Discharge status: Home / Self Care | Payer: 59 | Source: Ambulatory Visit | Attending: Physician Assistant | Admitting: Physician Assistant

## 2022-01-13 DIAGNOSIS — I10 Essential (primary) hypertension: Secondary | ICD-10-CM | POA: Diagnosis present

## 2022-01-13 DIAGNOSIS — R9389 Abnormal findings on diagnostic imaging of other specified body structures: Secondary | ICD-10-CM | POA: Insufficient documentation

## 2022-01-13 LAB — BASIC METABOLIC PANEL
Anion gap: 11 (ref 5–15)
BUN: 25 mg/dL — ABNORMAL HIGH (ref 8–23)
CO2: 27 mmol/L (ref 22–32)
Calcium: 9.1 mg/dL (ref 8.9–10.3)
Chloride: 100 mmol/L (ref 98–111)
Creatinine, Ser: 1.59 mg/dL — ABNORMAL HIGH (ref 0.44–1.00)
GFR, Estimated: 34 mL/min — ABNORMAL LOW (ref >60)
Glucose, Bld: 132 mg/dL — ABNORMAL HIGH (ref 70–99)
Potassium: 3.3 mmol/L — ABNORMAL LOW (ref 3.5–5.1)
Sodium: 138 mmol/L (ref 135–145)

## 2022-01-16 ENCOUNTER — Telehealth: Payer: Self-pay | Admitting: Cardiology

## 2022-01-16 ENCOUNTER — Telehealth: Payer: Self-pay

## 2022-01-16 DIAGNOSIS — Z79899 Other long term (current) drug therapy: Secondary | ICD-10-CM

## 2022-01-16 DIAGNOSIS — J8410 Pulmonary fibrosis, unspecified: Secondary | ICD-10-CM

## 2022-01-16 MED ORDER — POTASSIUM CHLORIDE CRYS ER 20 MEQ PO TBCR: EXTENDED_RELEASE_TABLET | ORAL | 0 refills | Status: DC

## 2022-01-16 NOTE — Telephone Encounter (Signed)
-----   Message from Imogene Burn, PA-C sent at 01/16/2022  8:13 AM EDT ----- CT shows pulmonary fibrosis-please refer to pulmonary for further f/u and treatment if she doesn't already have one.

## 2022-01-16 NOTE — Telephone Encounter (Signed)
Please see previous note.

## 2022-01-16 NOTE — Telephone Encounter (Signed)
Spoke to pt who verbalized understanding. Pt had no question or concerns at this time.

## 2022-01-16 NOTE — Telephone Encounter (Signed)
-----   Message from Imogene Burn, PA-C sent at 01/15/2022  8:47 AM EDT ----- Kidney function up and K low. Stop triamterene/HCTZ. Give Kdur 20 meq 2 tablets daily for 3 days then stop. Increase hydration-at least 1 Liter of water a day. Recheck bmet next week. Keep track of BP and let us know if > 135/85. If she doesn't have a cuff can have nurse visit next week when she comes in for bmet. Thanks.

## 2022-01-16 NOTE — Telephone Encounter (Signed)
Pt is calling back in regards to lab results. Requesting a call back.

## 2022-01-22 ENCOUNTER — Other Ambulatory Visit (HOSPITAL_COMMUNITY)
Admission: RE | Admit: 2022-01-22 | Discharge: 2022-01-22 | Disposition: A | Discharge status: Home / Self Care | Payer: 59 | Source: Ambulatory Visit | Attending: Cardiology | Admitting: Cardiology

## 2022-01-22 DIAGNOSIS — Z79899 Other long term (current) drug therapy: Secondary | ICD-10-CM | POA: Insufficient documentation

## 2022-01-22 LAB — BASIC METABOLIC PANEL
Anion gap: 7 (ref 5–15)
BUN: 22 mg/dL (ref 8–23)
CO2: 25 mmol/L (ref 22–32)
Calcium: 9 mg/dL (ref 8.9–10.3)
Chloride: 108 mmol/L (ref 98–111)
Creatinine, Ser: 1.45 mg/dL — ABNORMAL HIGH (ref 0.44–1.00)
GFR, Estimated: 38 mL/min — ABNORMAL LOW (ref >60)
Glucose, Bld: 112 mg/dL — ABNORMAL HIGH (ref 70–99)
Potassium: 4.1 mmol/L (ref 3.5–5.1)
Sodium: 140 mmol/L (ref 135–145)

## 2022-01-23 ENCOUNTER — Telehealth: Payer: Self-pay | Admitting: *Deleted

## 2022-01-23 DIAGNOSIS — I10 Essential (primary) hypertension: Secondary | ICD-10-CM

## 2022-01-23 DIAGNOSIS — Z79899 Other long term (current) drug therapy: Secondary | ICD-10-CM

## 2022-01-23 NOTE — Telephone Encounter (Signed)
-----   Message from Imogene Burn, PA-C sent at 01/22/2022 12:35 PM EDT ----- K normal and kidney function improving. Repeat bmet in 2 weeks. No changes

## 2022-01-23 NOTE — Telephone Encounter (Signed)
Pt notified and voiced understanding 

## 2022-01-27 ENCOUNTER — Other Ambulatory Visit: Payer: Self-pay | Admitting: *Deleted

## 2022-01-27 DIAGNOSIS — I6523 Occlusion and stenosis of bilateral carotid arteries: Secondary | ICD-10-CM

## 2022-02-04 ENCOUNTER — Ambulatory Visit: Payer: 59 | Admitting: Cardiology

## 2022-02-04 ENCOUNTER — Encounter: Payer: Self-pay | Admitting: Cardiology

## 2022-02-04 ENCOUNTER — Other Ambulatory Visit (HOSPITAL_COMMUNITY)
Admission: RE | Admit: 2022-02-04 | Discharge: 2022-02-04 | Disposition: A | Discharge status: Home / Self Care | Payer: 59 | Source: Ambulatory Visit | Attending: Cardiology | Admitting: Cardiology

## 2022-02-04 ENCOUNTER — Ambulatory Visit (INDEPENDENT_AMBULATORY_CARE_PROVIDER_SITE_OTHER): Payer: 59 | Admitting: Cardiology

## 2022-02-04 VITALS — BP 118/60 | HR 78 | Ht 59.0 in | Wt 160.0 lb

## 2022-02-04 DIAGNOSIS — I10 Essential (primary) hypertension: Secondary | ICD-10-CM | POA: Diagnosis present

## 2022-02-04 DIAGNOSIS — I6523 Occlusion and stenosis of bilateral carotid arteries: Secondary | ICD-10-CM

## 2022-02-04 DIAGNOSIS — I25119 Atherosclerotic heart disease of native coronary artery with unspecified angina pectoris: Secondary | ICD-10-CM

## 2022-02-04 DIAGNOSIS — Z79899 Other long term (current) drug therapy: Secondary | ICD-10-CM | POA: Insufficient documentation

## 2022-02-04 LAB — BASIC METABOLIC PANEL
Anion gap: 8 (ref 5–15)
BUN: 21 mg/dL (ref 8–23)
CO2: 25 mmol/L (ref 22–32)
Calcium: 8.7 mg/dL — ABNORMAL LOW (ref 8.9–10.3)
Chloride: 106 mmol/L (ref 98–111)
Creatinine, Ser: 1.31 mg/dL — ABNORMAL HIGH (ref 0.44–1.00)
GFR, Estimated: 43 mL/min — ABNORMAL LOW (ref >60)
Glucose, Bld: 128 mg/dL — ABNORMAL HIGH (ref 70–99)
Potassium: 4 mmol/L (ref 3.5–5.1)
Sodium: 139 mmol/L (ref 135–145)

## 2022-02-04 NOTE — Progress Notes (Signed)
Cardiology Office Note  Date: 02/04/2022   ID: Iana, Buzan 1950/11/21, MRN [id number]  PCP:  Glenda Chroman, MD  Cardiologist:  Rozann Lesches, MD Electrophysiologist:  None   Chief Complaint  Patient presents with   Cardiac follow-up    History of Present Illness: [name] is a 71 y.o. female last seen in May by Ms. Vita Barley, I reviewed the note.  Last in May at which point cardiac catheterization showed severe left main/multivessel CAD.  Patient was evaluated by Dr. Cyndia Bent and not felt to be a surgical candidate due to extensive aortic calcification extending into the great vessels, also off-pump LIMA to LAD not an option due to subclavian stenosis.  The interventional team felt that PCI would be very high risk and therefore medical therapy was ultimately recommended.  She is here for follow-up today, feels less short of breath and without progressive angina symptoms on current medications.  She is functional with ADLs, but does have to pace herself.  Also limited by chronic back and leg pain.  I reviewed her cardiac regimen which is outlined below and stable.  Past Medical History:  Diagnosis Date   Abnormal chest CT    Anxiety    Arthritis    Rhumatoid and Osteoarthritis   Carotid artery disease (HCC)    Dr. Trula Slade, s/p left CEA, severe R ICA stenosis   Coronary atherosclerosis of native coronary artery    DES RCA/circumflex 4/10, LVEF 65 -> repeat cath 11/2021 with multivessel CAD including LM stenosis, initial med rx   Depression    Essential hypertension    Fibromyalgia    GERD (gastroesophageal reflux disease)    Hiatal hernia    Hyperlipidemia    Kidney cysts    NSTEMI (non-ST elevated myocardial infarction) ([place])    4/10   Renal artery stenosis (HCC)    Left renal artery stent 12/13   Restless leg syndrome    Spondylosis without myelopathy    Tobacco abuse     Past Surgical History:  Procedure Laterality Date   ABDOMINAL HYSTERECTOMY   1977   BREAST LUMPECTOMY     CAROTID ENDARTERECTOMY Left 02/28/2009   CHOLECYSTECTOMY  1991   COLONOSCOPY     COLONOSCOPY N/A 05/08/2017   Procedure: COLONOSCOPY;  Surgeon: Rogene Houston, MD;  [facility];  Service: Endoscopy;  Laterality: N/A;  12:00   ESOPHAGOGASTRODUODENOSCOPY N/A 04/07/2013   Procedure: ESOPHAGOGASTRODUODENOSCOPY (EGD);  Surgeon: Rogene Houston, MD;  [facility];  Service: Endoscopy;  Laterality: N/A;  250   ESOPHAGOGASTRODUODENOSCOPY N/A 02/02/2014   Procedure: ESOPHAGOGASTRODUODENOSCOPY (EGD);  Surgeon: Rogene Houston, MD;  [facility];  Service: Endoscopy;  Laterality: N/A;  120   EYE SURGERY     Catartact bilateral   JOINT REPLACEMENT  March 2013   Left knee   JOINT REPLACEMENT  2006   Left Knee   kidney stent     Left December 2013   LEFT HEART CATH AND CORONARY ANGIOGRAPHY N/A 11/13/2021   Procedure: LEFT HEART CATH AND CORONARY ANGIOGRAPHY;  Surgeon: Wellington Hampshire, MD;  Location: [facility];  Service: Cardiovascular;  Laterality: N/A;   LUMBAR DISC SURGERY     L5-S1   MULTIPLE TOOTH EXTRACTIONS  Jan. 2015   Neck Fusion  1996   C4 to C6   PERCUTANEOUS STENT INTERVENTION Left 06/15/2012   Procedure: PERCUTANEOUS STENT INTERVENTION;  Surgeon: Serafina Mitchell, MD;  Location:  [facility];  Service: Cardiovascular;  Laterality: Left;  renal   PERIPHERAL VASCULAR BALLOON ANGIOPLASTY  05/26/2017   Procedure: PERIPHERAL VASCULAR BALLOON ANGIOPLASTY;  Surgeon: Serafina Mitchell, MD;  Location: [place] [facility];  Service: Cardiovascular;;  Lt. Renal Angioplasty   POLYPECTOMY  05/08/2017   Procedure: POLYPECTOMY;  Surgeon: Rogene Houston, MD;  [facility];  Service: Endoscopy;;  colon    RENAL ANGIOGRAM N/A 06/15/2012   Procedure: RENAL ANGIOGRAM;  Surgeon: Serafina Mitchell, MD;  Location: [facility];  Service: Cardiovascular;  Laterality: N/A;   RENAL ANGIOGRAPHY  05/26/2017   Procedure: RENAL  ANGIOGRAPHY;  Surgeon: Serafina Mitchell, MD;  Location: [place] [facility];  Service: Cardiovascular;;   SPINE SURGERY  1994   Ruptured disc   TONSILECTOMY, ADENOIDECTOMY, BILATERAL MYRINGOTOMY AND TUBES  1969   TOTAL KNEE ARTHROPLASTY     Left   TUBAL LIGATION      Current Outpatient Medications  Medication Sig Dispense Refill   albuterol (VENTOLIN HFA) 108 (90 Base) MCG/ACT inhaler 1 puff 4 (four) times daily as needed for wheezing or shortness of breath.     amLODipine (NORVASC) 5 MG tablet Take 1 tablet (5 mg total) by mouth daily. 30 tablet 5   aspirin 81 MG EC tablet Take 81 mg by mouth daily.     CALCIUM PO Take 1 tablet by mouth 2 (two) times daily. Gummy     cetirizine (ZYRTEC) 10 MG chewable tablet Chew 10 mg by mouth daily as needed for rhinitis.     DAYVIGO 5 MG TABS Take 5 mg by mouth at bedtime as needed (Sleep).     diazepam (VALIUM) 5 MG tablet Take 5 mg by mouth 2 (two) times daily as needed for anxiety.     diclofenac Sodium (VOLTAREN) 1 % GEL Apply 2 g topically 3 (three) times daily.     DULoxetine (CYMBALTA) 20 MG capsule Take 20 mg by mouth at bedtime.     EPINEPHrine (EPIPEN JR) 0.15 MG/0.3ML injection Inject 0.15 mg into the muscle as needed for anaphylaxis.     fluticasone (FLONASE) 50 MCG/ACT nasal spray Place 2 sprays into both nostrils daily as needed for allergies.      isosorbide mononitrate (IMDUR) 30 MG 24 hr tablet Take 1 tablet (30 mg total) by mouth daily. 30 tablet 5   levothyroxine (SYNTHROID) 25 MCG tablet Take 25 mcg by mouth daily before breakfast.     Metamucil Fiber CHEW Chew 3 tablets by mouth daily.     metoprolol succinate (TOPROL-XL) 50 MG 24 hr tablet Take 1 tablet (50 mg total) by mouth daily. 30 tablet 5   Multiple Vitamins-Minerals (ALIVE WOMENS GUMMY PO) Take 1 tablet by mouth daily. gummy     nitroGLYCERIN (NITROSTAT) 0.4 MG SL tablet Place 0.4 mg under the tongue every 5 (five) minutes as needed for chest pain.      Omega-3 1000 MG CAPS  Take 1,000 mg by mouth daily.     omeprazole (PRILOSEC) 20 MG capsule Take 20 mg by mouth daily.     ondansetron (ZOFRAN) 4 MG tablet Take 4 mg by mouth 3 (three) times daily as needed for nausea.     oxyCODONE-acetaminophen (PERCOCET/ROXICET) 5-325 MG tablet Take 1 tablet by mouth 3 (three) times daily as needed for moderate pain or severe pain.     potassium chloride SA (KLOR-CON M) 20 MEQ tablet Take 1 tablet twice daily for 3 days, then stop.  6 tablet 0   rosuvastatin (CRESTOR) 40 MG tablet Take 1 tablet (40 mg total) by mouth at bedtime. 30 tablet 5   sertraline (ZOLOFT) 50 MG tablet Take 50 mg by mouth daily.     trimethoprim-polymyxin b (POLYTRIM) ophthalmic solution Place 1 drop into the right eye in the morning, at noon, in the evening, and at bedtime.     Turmeric 500 MG CAPS Take 500 mg by mouth daily.     vitamin B-12 (CYANOCOBALAMIN) 1000 MCG tablet Take 2 tablets (2,000 mcg total) by mouth daily. (Patient taking differently: Take 1,000 mcg by mouth daily.) 60 tablet 0   Vitamin D, Ergocalciferol, (DRISDOL) 50000 UNITS CAPS Take 50,000 Units by mouth every Monday.      zinc gluconate 50 MG tablet Take 50 mg by mouth daily.     No current facility-administered medications for this visit.   Allergies:  Celecoxib, Ciprofloxacin hcl, Codeine, Dicyclomine, Lamictal [lamotrigine], Metoclopramide hcl, Morphine and related, Omeprazole, Savella [milnacipran hcl], Simvastatin, Tape, Tizanidine, Trazodone and nefazodone, Venlafaxine, Citalopram, and Sertraline hcl   ROS:  Chronic back pain.  Physical Exam: VS:  BP 118/60   Pulse 78   Ht '4\' 11"'$  (1.499 m)   Wt 160 lb (72.6 kg)   SpO2 95%   BMI 32.32 kg/m , BMI Body mass index is 32.32 kg/m.  Wt Readings from Last 3 Encounters:  02/04/22 160 lb (72.6 kg)  11/27/21 162 lb 6.4 oz (73.7 kg)  11/26/21 162 lb 8 oz (73.7 kg)    General: Patient appears comfortable at rest. HEENT: Conjunctiva and lids normal, oropharynx clear. Neck:  Supple, no elevated JVP, right carotid bruit, no thyromegaly. Lungs: Clear to auscultation, nonlabored breathing at rest. Cardiac: Regular rate and rhythm, no S3, 2/6 systolic murmur. Extremities: Mild venous stasis and ankle edema.  ECG:  An ECG dated 11/11/2021 was personally reviewed today and demonstrated:  Sinus rhythm.  Recent Labwork: 06/01/2021: ALT 14; AST 20 11/14/2021: Hemoglobin 12.1; Platelets 187 02/04/2022: BUN 21; Creatinine, Ser 1.31; Potassium 4.0; Sodium 139   Other Studies Reviewed Today:  Carotid Dopplers 09/19/2021: 80 to 99% RICA stenosis, 1 to 32% LICA stenosis.  Echocardiogram 09/19/2021:  1. Left ventricular ejection fraction, by estimation, is 60 to 65%. The  left ventricle has normal function. The left ventricle has no regional  wall motion abnormalities. Left ventricular diastolic parameters are  consistent with Grade I diastolic  dysfunction (impaired relaxation). Elevated left atrial pressure.   2. Right ventricular systolic function is normal. The right ventricular  size is normal.   3. The mitral valve is abnormal. Mild mitral valve regurgitation. No  evidence of mitral stenosis.   4. The aortic valve is tricuspid. There is mild calcification of the  aortic valve. There is mild thickening of the aortic valve. Aortic valve  regurgitation is not visualized. No aortic stenosis is present.   Cardiac catheterization 11/13/2021:   Ost LM to Mid LM lesion is 90% stenosed.   Mid Cx lesion is 20% stenosed.   Mid Cx to Dist Cx lesion is 70% stenosed.   Prox Cx lesion is 60% stenosed.   Ost RCA to Prox RCA lesion is 20% stenosed.   Prox RCA to Mid RCA lesion is 30% stenosed.   RPDA lesion is 90% stenosed.   Prox RCA lesion is 60% stenosed.   RPAV lesion is 60% stenosed.   1.  Severe calcified ostial left main stenosis with significant pressure dampening with catheter engagement.  Patent left  circumflex disease with mild in-stent restenosis and progression of  native vessel disease in the left circumflex.  Patent RCA stents with diffuse disease in multiple areas with subtotal occlusion of the right PDA with well-developed left-to-right collaterals. 2.  Left ventricular angiography was not performed.  EF was normal by echo.  Severely elevated blood pressure with moderately elevated left ventricular end-diastolic pressure and no significant gradient across the aortic valve.   Recommendations: Due to severe left main disease and patient's symptoms, recommend hospital admission for CABG evaluation.  The RCA might not have good targets for CABG but the right PDA has excellent collaterals from the left.  Assessment and Plan:  1.  Left main/multivessel CAD with poor revascularization options as per above discussion.  Plan is for medical therapy and fortunately she is tolerating this reasonably well at this point with basic ADLs.  No increasing nitroglycerin use.  Continue aspirin, Norvasc, Imdur, Toprol-XL, Crestor, and as needed nitroglycerin.  2.  Carotid artery disease with severe RICA stenosis as of Dopplers in March.  She would be high risk for carotid endarterectomy.  Keep follow-up with [facility].  She is on aspirin and Crestor.  3.  Essential hypertension, blood pressure is well controlled today on current regimen.  No changes were made.  Medication Adjustments/Labs and Tests Ordered: Current medicines are reviewed at length with the patient today.  Concerns regarding medicines are outlined above.   Tests Ordered: No orders of the defined types were placed in this encounter.   Medication Changes: No orders of the defined types were placed in this encounter.   Disposition:  Follow up  6 months.  Signed, Satira Sark, MD, [facility] 02/04/2022 1:58 PM    [place] [facility] [address], [place], [place] [address] Phone: [phone]; Fax: [phone]

## 2022-02-04 NOTE — Patient Instructions (Signed)
Medication Instructions:  Your physician recommends that you continue on your current medications as directed. Please refer to the Current Medication list given to you today.   Labwork: None today  Testing/Procedures: None today  Follow-Up: 6 months  Any Other Special Instructions Will Be Listed Below (If Applicable).  If you need a refill on your cardiac medications before your next appointment, please call your pharmacy.  

## 2022-02-17 ENCOUNTER — Encounter (HOSPITAL_COMMUNITY): Payer: 59

## 2022-02-17 ENCOUNTER — Ambulatory Visit: Payer: 59

## 2022-02-17 ENCOUNTER — Ambulatory Visit (HOSPITAL_COMMUNITY): Payer: 59

## 2022-02-17 ENCOUNTER — Ambulatory Visit: Payer: Medicare Other | Admitting: Surgery

## 2022-02-25 ENCOUNTER — Ambulatory Visit (INDEPENDENT_AMBULATORY_CARE_PROVIDER_SITE_OTHER): Payer: Medicare Other | Admitting: Internal Medicine

## 2022-02-25 ENCOUNTER — Encounter: Payer: Self-pay | Admitting: Internal Medicine

## 2022-02-25 DIAGNOSIS — J84112 Idiopathic pulmonary fibrosis: Secondary | ICD-10-CM | POA: Insufficient documentation

## 2022-02-25 NOTE — Assessment & Plan Note (Addendum)
Onset ? In setting of low grade RA  - macrodantin exposure ended 08/01/21  - HRCT chest 01/13/22  prob uip/ emphysema  - 02/25/2022   Walked on RA  x  3  lap(s) =  approx 450  ft  @ slow pace, stopped due to end of study with lowest 02 sats 90% - 02/25/2022 rec gerd diet / serial walking sats  - collagen vasc profile 02/25/2022  - [facility]  Serology 02/25/2022     DDx for pulmonary fibrosis  includes idiopathic pulmonary fibrosis, pulmonary fibrosis associated with rheumatologic diseases (which have a relatively benign course in most cases) , adverse effect from  drugs such as chemotherapy or amiodarone exposure, nonspecific interstitial pneumonia which is typically steroid responsive, and chronic hypersensitivity pneumonitis.   In active  smokers Langerhan's Cell  Histiocyctosis (eosinophilic granuomatosis),  DIP,  and Respiratory Bronchiolitis ILD also need to be considered but less likely  To be progressive in nature now that she says she's quit smoking ,    Will start with collagen vasc w/u and gerd diet (? Allergic to PPI's_ and f/u serial 02 sats with ex before returning with pfts and consider refer to PF clinic for antifibrotic  Discussed in detail all the  indications, usual  risks and alternatives  relative to the benefits with patient who agrees to proceed with Rx as outlined.      Each maintenance medication was reviewed in detail including emphasizing most importantly the difference between maintenance and prns and under what circumstances the prns are to be triggered using an action plan format where appropriate.  Total time for H and P, chart review, counseling,  directly observing portions of ambulatory 02 saturation study/ and generating customized AVS unique to this office visit / same day charting > 45 min new pt eval

## 2022-02-25 NOTE — Patient Instructions (Addendum)
Avoid macrodantin indefinitely   GERD (REFLUX)  is an extremely common cause of respiratory symptoms just like yours , many times with no obvious heartburn at all.    It can be treated with medication, but also with lifestyle changes including elevation of the head of your bed (ideally with 6 -8inch blocks under the headboard of your bed),  Smoking cessation, avoidance of late meals, excessive alcohol, and avoid fatty foods, chocolate, peppermint, colas, red wine, and acidic juices such as orange juice.  NO MINT OR MENTHOL PRODUCTS SO NO COUGH DROPS  USE SUGARLESS CANDY INSTEAD (Jolley ranchers or Stover's or Life Savers) or even ice chips will also do - the key is to swallow to prevent all throat clearing. NO OIL BASED VITAMINS - use powdered substitutes.  Avoid fish oil when coughing.     Make sure you check your oxygen saturation  AT  your highest level of activity (not after you stop)   to be sure it stays over 90% and  if dropping we may need to start you on oxygen   Please schedule a follow up visit in 3 months but call sooner if needed with pfts next available

## 2022-02-25 NOTE — Progress Notes (Signed)
MINERVIA OSSO, female    DOB: 01/02/1951    MRN: [id number]   Brief patient profile:  15  yowf quit smoking 02/18/22  with stable AP  referred to pulmonary clinic in [facility]  02/25/2022 by Jonelle Sidle  for ? PF  with onset of  rhinitis/ sob several years prior to breathing problems she attributed  to covid. Aug 2022.   Has seen [facility] in past with dx of RA does not recall details of rx    History of Present Illness  02/25/2022  Pulmonary/ 1st office eval/ Melvyn Novas / [facility] last dose of macrodantin 08/01/21  Chief Complaint  Patient presents with   Consult    Consult for pulm fibrosis ref by cardiology   *patient states she had chest pain this am when walking*   Dyspnea:  vaccuum /  walking mailbox slt uphill to mailbox 100 ft and freq nitro p walk less lately  Cough: no more cough  Sleep: no resp symptoms 30 degrees  SABA use: 7 h ours prior to OV     No obvious day to day or daytime pattern/variability or assoc excess/ purulent sputum or mucus plugs or hemoptysis or  subjective wheeze or overt sinus or hb symptoms.   Sleeping as above  without nocturnal  or early am exacerbation  of respiratory  c/o's or need for noct saba. Also denies any obvious fluctuation of symptoms with weather or environmental changes or other aggravating or alleviating factors except as outlined above   No unusual exposure hx or h/o childhood pna/ asthma or knowledge of premature birth.  Current Allergies, Complete Past Medical History, Past Surgical History, Family History, and Social History were reviewed in [employer] record.  ROS  The following are not active complaints unless bolded Hoarseness, sore throat, dysphagia, dental problems, itching, sneezing,  nasal congestion or discharge of excess mucus or purulent secretions, ear ache,   fever, chills, sweats, unintended wt loss or wt gain, classically pleuritic  orthopnea pnd or arm/hand swelling  or leg swelling,  presyncope, palpitations, abdominal pain, anorexia, nausea, vomiting, diarrhea  or change in bowel habits or change in bladder habits, change in stools or change in urine, dysuria, hematuria,  rash, arthralgias, visual complaints, headache, numbness, weakness or ataxia or problems with walking or coordination,  change in mood or  memory.           Past Medical History:  Diagnosis Date   Abnormal chest CT    Anxiety    Arthritis    Rhumatoid and Osteoarthritis   Carotid artery disease (HCC)    Dr. Trula Slade, s/p left CEA, severe R ICA stenosis   Coronary atherosclerosis of native coronary artery    DES RCA/circumflex 4/10, LVEF 65 -> repeat cath 11/2021 with multivessel CAD including LM stenosis, initial med rx   Depression    Essential hypertension    Fibromyalgia    GERD (gastroesophageal reflux disease)    Hiatal hernia    Hyperlipidemia    Kidney cysts    NSTEMI (non-ST elevated myocardial infarction) ([place])    4/10   Renal artery stenosis (HCC)    Left renal artery stent 12/13   Restless leg syndrome    Spondylosis without myelopathy    Tobacco abuse     Outpatient Medications Prior to Visit  Medication Sig Dispense Refill   albuterol (VENTOLIN HFA) 108 (90 Base) MCG/ACT inhaler 1 puff 4 (four) times daily as needed for wheezing or shortness of  breath.     amLODipine (NORVASC) 5 MG tablet Take 1 tablet (5 mg total) by mouth daily. 30 tablet 5   aspirin 81 MG EC tablet Take 81 mg by mouth daily.     CALCIUM PO Take 1 tablet by mouth 2 (two) times daily. Gummy     cetirizine (ZYRTEC) 10 MG chewable tablet Chew 10 mg by mouth daily as needed for rhinitis.     DAYVIGO 5 MG TABS Take 5 mg by mouth at bedtime as needed (Sleep).     diazepam (VALIUM) 5 MG tablet Take 5 mg by mouth 2 (two) times daily as needed for anxiety.     diclofenac Sodium (VOLTAREN) 1 % GEL Apply 2 g topically 3 (three) times daily.     DULoxetine (CYMBALTA) 20 MG capsule Take 20 mg by mouth at bedtime.      EPINEPHrine (EPIPEN JR) 0.15 MG/0.3ML injection Inject 0.15 mg into the muscle as needed for anaphylaxis.     fluticasone (FLONASE) 50 MCG/ACT nasal spray Place 2 sprays into both nostrils daily as needed for allergies.      isosorbide mononitrate (IMDUR) 30 MG 24 hr tablet Take 1 tablet (30 mg total) by mouth daily. 30 tablet 5   levothyroxine (SYNTHROID) 25 MCG tablet Take 25 mcg by mouth daily before breakfast.     Metamucil Fiber CHEW Chew 3 tablets by mouth daily.     metoprolol succinate (TOPROL-XL) 50 MG 24 hr tablet Take 1 tablet (50 mg total) by mouth daily. 30 tablet 5   Multiple Vitamins-Minerals (ALIVE WOMENS GUMMY PO) Take 1 tablet by mouth daily. gummy     nitroGLYCERIN (NITROSTAT) 0.4 MG SL tablet Place 0.4 mg under the tongue every 5 (five) minutes as needed for chest pain.      Omega-3 1000 MG CAPS Take 1,000 mg by mouth daily.     omeprazole (PRILOSEC) 20 MG capsule Take 20 mg by mouth daily.     ondansetron (ZOFRAN) 4 MG tablet Take 4 mg by mouth 3 (three) times daily as needed for nausea.     oxyCODONE-acetaminophen (PERCOCET/ROXICET) 5-325 MG tablet Take 1 tablet by mouth 3 (three) times daily as needed for moderate pain or severe pain.     potassium chloride SA (KLOR-CON M) 20 MEQ tablet Take 1 tablet twice daily for 3 days, then stop. 6 tablet 0   rosuvastatin (CRESTOR) 40 MG tablet Take 1 tablet (40 mg total) by mouth at bedtime. 30 tablet 5   sertraline (ZOLOFT) 50 MG tablet Take 50 mg by mouth daily.     trimethoprim-polymyxin b (POLYTRIM) ophthalmic solution Place 1 drop into the right eye in the morning, at noon, in the evening, and at bedtime.     Turmeric 500 MG CAPS Take 500 mg by mouth daily.     vitamin B-12 (CYANOCOBALAMIN) 1000 MCG tablet Take 2 tablets (2,000 mcg total) by mouth daily. (Patient taking differently: Take 1,000 mcg by mouth daily.) 60 tablet 0   Vitamin D, Ergocalciferol, (DRISDOL) 50000 UNITS CAPS Take 50,000 Units by mouth every Monday.      zinc  gluconate 50 MG tablet Take 50 mg by mouth daily.     No facility-administered medications prior to visit.     Objective:     BP 128/84 (BP Location: Left Arm, Patient Position: Sitting)   Pulse 82   Temp 97.6 F (36.4 C) (Temporal)   Ht '4\' 11"'$  (1.499 m)   Wt 162 lb 3.2 oz (73.6  kg)   SpO2 94% Comment: ra  BMI 32.76 kg/m   SpO2: 94 % (ra)  Amb wf who failed to answer a single question asked in a straightforward manner, tending to go off on tangents or answer questions with ambiguous medical terms or diagnoses and seemed somewhat aggravated  when asked the same question more than once for clarification.     3/6 sem -  full top dentures    HEENT : Oropharynx  clear/ full top dentures   Nasal turbinates nl    NECK :  without  apparent JVD/ palpable Nodes/TM    LUNGS: no acc muscle use,  Min barrel  contour chest wall with bilateral  slightly decreased bs s audible wheeze and  without cough on insp or exp maneuvers and min  Hyperresonant  to  percussion bilaterally    CV:  RRR  no s3 or murmur or increase in P2, and no edema   ABD:  soft and nontender with pos end  insp Hoover's  in the supine position.  No bruits or organomegaly appreciated   MS:  Nl gait/ ext warm without deformities Or obvious joint restrictions  calf tenderness, cyanosis or clubbing     SKIN: warm and dry without lesions    NEURO:  alert, approp, nl sensorium with  no motor or cerebellar deficits apparent.            I personally reviewed images and agree with radiology impression as follows:   Chest HRCT  01/13/22 1. Mild pulmonary fibrosis in a pattern with apical to basal gradient, featuring irregular peripheral interstitial opacity, and septal thickening, with small areas of subpleural bronchiolectasis at the lung bases, however without clear evidence of traction bronchiectasis or honeycombing.   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 Small, indistinct ground-glass nodule of the  peripheral left upper lobe is unchanged, measuring 0.3 cm, nonspecific, almost certainly infectious or inflammatory. Additional tiny centrilobular pulmonary nodules, most concentrated in the lung apices, are unchanged, most commonly seen in smoking-related respiratory bronchiolitis. No follow-up needed if patient is low-risk (and has no known or suspected primary neoplasm). Non-contrast chest CT can be considered in 12 months if patient is high-risk.   3. Emphysema. 4. Coronary artery disease.   Aortic Atherosclerosis (ICD10-I70.0) and Emphysema (ICD10-J43.9).   Assessment   IPF (idiopathic pulmonary fibrosis) (HCC) Onset ? In setting of low grade RA  - macrodantin exposure ended 08/01/21  - HRCT chest 01/13/22  prob uip/ emphysema  - 02/25/2022   Walked on RA  x  3  lap(s) =  approx 450  ft  @ slow pace, stopped due to end of study with lowest 02 sats 90% - 02/25/2022 rec gerd diet / serial walking sats  - collagen vasc profile 02/25/2022  - HSP  Serology 02/25/2022     DDx for pulmonary fibrosis  includes idiopathic pulmonary fibrosis, pulmonary fibrosis associated with rheumatologic diseases (which have a relatively benign course in most cases) , adverse effect from  drugs such as chemotherapy or amiodarone exposure, nonspecific interstitial pneumonia which is typically steroid responsive, and chronic hypersensitivity pneumonitis.   In active  smokers Langerhan's Cell  Histiocyctosis (eosinophilic granuomatosis),  DIP,  and Respiratory Bronchiolitis ILD also need to be considered but less likely  To be progressive in nature now that she says she's quit smoking ,    Will start with collagen vasc w/u and gerd diet (? Allergic to PPI's_ and f/u serial 02 sats with ex  before returning with pfts and consider refer to PF clinic for antifibrotic  Discussed in detail all the  indications, usual  risks and alternatives  relative to the benefits with patient who agrees to proceed with Rx as  outlined.      Each maintenance medication was reviewed in detail including emphasizing most importantly the difference between maintenance and prns and under what circumstances the prns are to be triggered using an action plan format where appropriate.  Total time for H and P, chart review, counseling,  directly observing portions of ambulatory 02 saturation study/ and generating customized AVS unique to this office visit / same day charting > 45 min new pt eval                    Christinia Gully, MD 02/25/2022

## 2022-03-05 ENCOUNTER — Telehealth: Payer: Self-pay

## 2022-03-05 NOTE — Telephone Encounter (Signed)
Per Tigan with [facility], sputum sample that we sent to lab to test- hypersensitivity pneumonitis is on backorder and is scheduled to result on 04/11/22. Wanted to make provider aware. Dr.Wert routing as an Pharmacist, hospital

## 2022-03-07 DIAGNOSIS — I10 Essential (primary) hypertension: Secondary | ICD-10-CM | POA: Diagnosis not present

## 2022-03-07 DIAGNOSIS — M2550 Pain in unspecified joint: Secondary | ICD-10-CM | POA: Diagnosis not present

## 2022-03-07 DIAGNOSIS — N1831 Chronic kidney disease, stage 3a: Secondary | ICD-10-CM | POA: Diagnosis not present

## 2022-03-07 DIAGNOSIS — Z299 Encounter for prophylactic measures, unspecified: Secondary | ICD-10-CM | POA: Diagnosis not present

## 2022-03-08 LAB — CBC WITH DIFFERENTIAL/PLATELET
Basophils Absolute: 0 10*3/uL (ref 0.0–0.2)
Basos: 0 %
EOS (ABSOLUTE): 0.2 10*3/uL (ref 0.0–0.4)
Eos: 3 %
Hematocrit: 36.1 % (ref 34.0–46.6)
Hemoglobin: 11.9 g/dL (ref 11.1–15.9)
Immature Grans (Abs): 0 10*3/uL (ref 0.0–0.1)
Immature Granulocytes: 0 %
Lymphocytes Absolute: 1.9 10*3/uL (ref 0.7–3.1)
Lymphs: 27 %
MCH: 32.2 pg (ref 26.6–33.0)
MCHC: 33 g/dL (ref 31.5–35.7)
MCV: 98 fL — ABNORMAL HIGH (ref 79–97)
Monocytes Absolute: 0.7 10*3/uL (ref 0.1–0.9)
Monocytes: 10 %
Neutrophils Absolute: 4.1 10*3/uL (ref 1.4–7.0)
Neutrophils: 60 %
Platelets: 175 10*3/uL (ref 150–450)
RBC: 3.7 x10E6/uL — ABNORMAL LOW (ref 3.77–5.28)
RDW: 12.8 % (ref 11.7–15.4)
WBC: 6.8 10*3/uL (ref 3.4–10.8)

## 2022-03-08 LAB — HYPERSENSITIVITY PNEUMONITIS
A. Pullulans Abs: NEGATIVE
A.Fumigatus #1 Abs: NEGATIVE
Micropolyspora faeni, IgG: NEGATIVE
Pigeon Serum Abs: NEGATIVE
Thermoact. Saccharii: NEGATIVE
Thermoactinomyces vulgaris, IgG: NEGATIVE

## 2022-03-08 LAB — RHEUMATOID FACTOR: Rheumatoid fact SerPl-aCnc: <10 IU/mL (ref <14.0)

## 2022-03-08 LAB — CYCLIC CITRUL PEPTIDE ANTIBODY, IGG/IGA: Cyclic Citrullin Peptide Ab: 4 units (ref 0–19)

## 2022-03-08 LAB — SEDIMENTATION RATE: Sed Rate: 17 mm/hr (ref 0–40)

## 2022-03-08 LAB — ANA: Anti Nuclear Antibody (ANA): NEGATIVE

## 2022-03-14 ENCOUNTER — Ambulatory Visit: Payer: 59 | Admitting: Cardiology

## 2022-03-27 DIAGNOSIS — Z79891 Long term (current) use of opiate analgesic: Secondary | ICD-10-CM | POA: Diagnosis not present

## 2022-03-27 DIAGNOSIS — M25569 Pain in unspecified knee: Secondary | ICD-10-CM | POA: Diagnosis not present

## 2022-03-27 DIAGNOSIS — M545 Low back pain, unspecified: Secondary | ICD-10-CM | POA: Diagnosis not present

## 2022-03-27 DIAGNOSIS — G4700 Insomnia, unspecified: Secondary | ICD-10-CM | POA: Diagnosis not present

## 2022-03-27 DIAGNOSIS — M130 Polyarthritis, unspecified: Secondary | ICD-10-CM | POA: Diagnosis not present

## 2022-04-01 ENCOUNTER — Encounter (INDEPENDENT_AMBULATORY_CARE_PROVIDER_SITE_OTHER): Payer: Self-pay | Admitting: Gastroenterology

## 2022-04-01 ENCOUNTER — Ambulatory Visit (INDEPENDENT_AMBULATORY_CARE_PROVIDER_SITE_OTHER): Payer: 59 | Admitting: Gastroenterology

## 2022-04-07 ENCOUNTER — Ambulatory Visit (INDEPENDENT_AMBULATORY_CARE_PROVIDER_SITE_OTHER): Payer: Medicare Other | Admitting: Surgery

## 2022-04-07 ENCOUNTER — Encounter: Payer: Self-pay | Admitting: Surgery

## 2022-04-07 ENCOUNTER — Ambulatory Visit (HOSPITAL_COMMUNITY)
Admission: RE | Admit: 2022-04-07 | Discharge: 2022-04-07 | Disposition: A | Discharge status: Home / Self Care | Payer: Medicare Other | Source: Ambulatory Visit | Attending: Surgery | Admitting: Surgery

## 2022-04-07 VITALS — BP 117/69 | HR 66 | Temp 97.7°F | Resp 20 | Ht 59.0 in | Wt 161.0 lb

## 2022-04-07 DIAGNOSIS — I70213 Atherosclerosis of native arteries of extremities with intermittent claudication, bilateral legs: Secondary | ICD-10-CM | POA: Diagnosis not present

## 2022-04-07 DIAGNOSIS — I6523 Occlusion and stenosis of bilateral carotid arteries: Secondary | ICD-10-CM | POA: Diagnosis not present

## 2022-04-07 MED ORDER — NITROGLYCERIN 0.4 MG SL SUBL: 0.4000 mg | SUBLINGUAL_TABLET | SUBLINGUAL | 12 refills | Status: DC | PRN

## 2022-04-07 NOTE — Progress Notes (Signed)
[facility] [place]  Patient name: [name] MRN: [id number] DOB: 04-08-1951 Sex: female   REASON FOR VISIT:    Follow up  HISOTRY OF PRESENT ILLNESS:    [name] is a 71 y.o. female who is status post left carotid endarterectomy in 2010 for symptomatic stenosis.  She had extensive plaque which extended into her proximal common carotid artery.  I sent her for CT scan for further evaluation of her right-sided carotid disease.  Because of the extensive plaque, I do not think she is a good TCAR candidate.  I felt her best option was going to be a right carotid endarterectomy with retrograde stenting of the ostium if indicated.  She was also going to require intervention of recurrent mid left common carotid artery stenosis via redo carotid endarterectomy.  Because of her persistent cardiac symptoms, she was sent for formal clearance.  She had a cardiac catheterization.  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  She was also turned down by cardiology for high risk PCI.  Medical management has been favored for her coronary disease.  Because she was asymptomatic from her carotid perspective, I elected to monitor her without intervention.  She is back today for follow-up she denies any neurologic symptoms.  She denies numbness or weakness in either extremity.  She denies slurred speech.  She denies emesis features.  She continues to have intermittent chest pain.  I performed left renal artery stenting for hypertension on 06/15/2012.  She developed a recurrence and underwent angioplasty on 05/26/2017.  The patient is a former smoker.  She takes a statin for hypercholesterolemia.  She has a history of coronary artery disease, status post MI.  She has persistent angina.   PAST MEDICAL HISTORY:   Past Medical History:  Diagnosis Date   Abnormal chest CT    Anxiety    Arthritis    Rhumatoid and  Osteoarthritis   Carotid artery disease (HCC)    Dr. Trula Slade, s/p left CEA, severe R ICA stenosis   Coronary atherosclerosis of native coronary artery    DES RCA/circumflex 4/10, LVEF 65 -> repeat cath 11/2021 with multivessel CAD including LM stenosis, initial med rx   Depression    Essential hypertension    Fibromyalgia    GERD (gastroesophageal reflux disease)    Hiatal hernia    Hyperlipidemia    Kidney cysts    NSTEMI (non-ST elevated myocardial infarction) ([place])    4/10   Renal artery stenosis (HCC)    Left renal artery stent 12/13   Restless leg syndrome    Spondylosis without myelopathy    Tobacco abuse      FAMILY HISTORY:   Family History  Problem Relation Age of Onset   Other Mother        Cerebral hemorrage   Heart disease Mother    Heart attack Mother    Hyperlipidemia Mother    Heart disease Father    Heart attack Father    Hyperlipidemia Father    Heart disease Brother        Heart Disease before age 52   Diabetes Brother    Hypertension Brother    Heart attack Brother    Cancer Sister    Arthritis Sister    Heart disease Sister    Heart disease Daughter 56       Heart Disease before age 30   Heart attack Daughter  Hypertension Daughter    Cancer Sister    Arthritis Sister    Diabetes Brother        Varicose  Veins   Peripheral vascular disease Brother    Diabetes Brother     SOCIAL HISTORY:   Social History   Tobacco Use   Smoking status: Former    Years: 45.00    Types: Cigarettes    Quit date: 02/18/2022    Years since quitting: 0.1   Smokeless tobacco: Never  Substance Use Topics   Alcohol use: No    Alcohol/week: 0.0 standard drinks of alcohol     ALLERGIES:   Allergies  Allergen Reactions   Celecoxib Itching   Ciprofloxacin Hcl Other (See Comments)    Mya have made her sick or bad diarrhea   Codeine Itching   Dicyclomine Diarrhea and Other (See Comments)    Made diarrhea worse   Lamictal [Lamotrigine] Other (See  Comments)    Unknown   Metoclopramide Hcl Other (See Comments)    Dizziness    Morphine And Related Other (See Comments)    Patient states she is not allergic to morphine; states had morphine with no reaction   Omeprazole    Savella [Milnacipran Hcl] Other (See Comments)    Unknown    Simvastatin     Increased back and muscle pain   Tape Other (See Comments)    Skin gets red and skin pulls off, paper tape only   Tizanidine Other (See Comments)    unknown   Trazodone And Nefazodone Other (See Comments)    hallucinations   Venlafaxine Other (See Comments)    Affected eyes, blurred vision   Citalopram Palpitations and Other (See Comments)    Heart rate increased   Sertraline Hcl Other (See Comments)    Stomach upset     CURRENT MEDICATIONS:   Current Outpatient Medications  Medication Sig Dispense Refill   albuterol (VENTOLIN HFA) 108 (90 Base) MCG/ACT inhaler 1 puff 4 (four) times daily as needed for wheezing or shortness of breath.     amLODipine (NORVASC) 5 MG tablet Take 1 tablet (5 mg total) by mouth daily. 30 tablet 5   aspirin 81 MG EC tablet Take 81 mg by mouth daily.     CALCIUM PO Take 1 tablet by mouth 2 (two) times daily. Gummy     cetirizine (ZYRTEC) 10 MG chewable tablet Chew 10 mg by mouth daily as needed for rhinitis.     DAYVIGO 5 MG TABS Take 5 mg by mouth at bedtime as needed (Sleep).     diazepam (VALIUM) 5 MG tablet Take 5 mg by mouth 2 (two) times daily as needed for anxiety.     diclofenac Sodium (VOLTAREN) 1 % GEL Apply 2 g topically 3 (three) times daily.     DULoxetine (CYMBALTA) 20 MG capsule Take 20 mg by mouth at bedtime.     EPINEPHrine (EPIPEN JR) 0.15 MG/0.3ML injection Inject 0.15 mg into the muscle as needed for anaphylaxis.     fluticasone (FLONASE) 50 MCG/ACT nasal spray Place 2 sprays into both nostrils daily as needed for allergies.      isosorbide mononitrate (IMDUR) 30 MG 24 hr tablet Take 1 tablet (30 mg total) by mouth daily. 30 tablet 5    levothyroxine (SYNTHROID) 25 MCG tablet Take 25 mcg by mouth daily before breakfast.     Metamucil Fiber CHEW Chew 3 tablets by mouth daily.     metoprolol succinate (TOPROL-XL) 50 MG  24 hr tablet Take 1 tablet (50 mg total) by mouth daily. 30 tablet 5   Multiple Vitamins-Minerals (ALIVE WOMENS GUMMY PO) Take 1 tablet by mouth daily. gummy     nitroGLYCERIN (NITROSTAT) 0.4 MG SL tablet Place 0.4 mg under the tongue every 5 (five) minutes as needed for chest pain.      Omega-3 1000 MG CAPS Take 1,000 mg by mouth daily.     ondansetron (ZOFRAN) 4 MG tablet Take 4 mg by mouth 3 (three) times daily as needed for nausea.     oxyCODONE-acetaminophen (PERCOCET/ROXICET) 5-325 MG tablet Take 1 tablet by mouth 3 (three) times daily as needed for moderate pain or severe pain.     potassium chloride SA (KLOR-CON M) 20 MEQ tablet Take 1 tablet twice daily for 3 days, then stop. 6 tablet 0   rosuvastatin (CRESTOR) 40 MG tablet Take 1 tablet (40 mg total) by mouth at bedtime. 30 tablet 5   sertraline (ZOLOFT) 50 MG tablet Take 50 mg by mouth daily.     trimethoprim-polymyxin b (POLYTRIM) ophthalmic solution Place 1 drop into the right eye in the morning, at noon, in the evening, and at bedtime.     Turmeric 500 MG CAPS Take 500 mg by mouth daily.     vitamin B-12 (CYANOCOBALAMIN) 1000 MCG tablet Take 2 tablets (2,000 mcg total) by mouth daily. (Patient taking differently: Take 1,000 mcg by mouth daily.) 60 tablet 0   Vitamin D, Ergocalciferol, (DRISDOL) 50000 UNITS CAPS Take 50,000 Units by mouth every Monday.      zinc gluconate 50 MG tablet Take 50 mg by mouth daily.     No current facility-administered medications for this visit.    REVIEW OF SYSTEMS:   '[X]'$  denotes positive finding, '[ ]'$  denotes negative finding Cardiac  Comments:  Chest pain or chest pressure:    Shortness of breath upon exertion:    Short of breath when lying flat:    Irregular heart rhythm:        Vascular    Pain in calf,  thigh, or hip brought on by ambulation:    Pain in feet at night that wakes you up from your sleep:     Blood clot in your veins:    Leg swelling:         Pulmonary    Oxygen at home:    Productive cough:     Wheezing:         Neurologic    Sudden weakness in arms or legs:     Sudden numbness in arms or legs:     Sudden onset of difficulty speaking or slurred speech:    Temporary loss of vision in one eye:     Problems with dizziness:         Gastrointestinal    Blood in stool:     Vomited blood:         Genitourinary    Burning when urinating:     Blood in urine:        Psychiatric    Major depression:         Hematologic    Bleeding problems:    Problems with blood clotting too easily:        Skin    Rashes or ulcers:        Constitutional    Fever or chills:      PHYSICAL EXAM:   Vitals:   04/07/22 1336 04/07/22 1339  BP: 130/76 117/69  Pulse: 66   Resp: 20   Temp: 97.7 F (36.5 C)   SpO2: 91%   Weight: 161 lb (73 kg)   Height: '4\' 11"'$  (1.499 m)     GENERAL: The patient is a well-nourished female, in no acute distress. The vital signs are documented above. CARDIAC: There is a regular rate and rhythm.  VASCULAR: Nonpalpable pedal pulses PULMONARY: Non-labored respirations ABDOMEN: Soft and non-tender  MUSCULOSKELETAL: There are no major deformities or cyanosis. NEUROLOGIC: No focal weakness or paresthesias are detected. SKIN: There are no ulcers or rashes noted. PSYCHIATRIC: The patient has a normal affect.  STUDIES:   I reviewed her carotid ultrasound with the following findings: Right Carotid: Velocities in the right ICA are consistent with a 80-99%                 stenosis. Hemodynamically significant plaque >50%  visualized in                 the CCA. The ECA appears >50% stenosed.   Left Carotid: Velocities in the left ICA are consistent with a 40-59%  stenosis.                Hemodynamically significant plaque >50% visualized in the   CCA. The                ECA appears <50% stenosed.   Vertebrals:  Bilateral vertebral arteries demonstrate antegrade flow.  Subclavians: Bilateral subclavian arteries were stenotic. Bilateral  subclavian   MEDICAL ISSUES:   Asymptomatic carotid disease, right greater than left: The patient was initially scheduled for right carotid endarterectomy with retrograde stenting and then subsequent redo left endarterectomy.  She was sent for cardiac clearance.  She could not be cleared from a cardiac perspective.  She was turned down for CABG as well as high risk PCI.  Because she remains asymptomatic and is not a candidate for cardiac intervention, I will continue to treat her carotid disease with medical management.  Should she develop signs or symptoms, we will be forced to consider intervention.  Hopefully that does not happen.  I have her scheduled for follow-up with me with repeat carotid ultrasound in 6 months.    Leia Alf, MD, FACS [facility] [phone] Pager [phone]

## 2022-04-09 ENCOUNTER — Other Ambulatory Visit: Payer: Self-pay

## 2022-04-09 DIAGNOSIS — I6523 Occlusion and stenosis of bilateral carotid arteries: Secondary | ICD-10-CM

## 2022-04-18 ENCOUNTER — Other Ambulatory Visit: Payer: Self-pay | Admitting: Physician Assistant

## 2022-04-18 DIAGNOSIS — J069 Acute upper respiratory infection, unspecified: Secondary | ICD-10-CM | POA: Diagnosis not present

## 2022-04-18 DIAGNOSIS — I10 Essential (primary) hypertension: Secondary | ICD-10-CM | POA: Diagnosis not present

## 2022-05-07 DIAGNOSIS — Z23 Encounter for immunization: Secondary | ICD-10-CM | POA: Diagnosis not present

## 2022-05-07 DIAGNOSIS — N1831 Chronic kidney disease, stage 3a: Secondary | ICD-10-CM | POA: Diagnosis not present

## 2022-05-07 DIAGNOSIS — R5383 Other fatigue: Secondary | ICD-10-CM | POA: Diagnosis not present

## 2022-05-07 DIAGNOSIS — M545 Low back pain, unspecified: Secondary | ICD-10-CM | POA: Diagnosis not present

## 2022-05-07 DIAGNOSIS — E7800 Pure hypercholesterolemia, unspecified: Secondary | ICD-10-CM | POA: Diagnosis not present

## 2022-05-07 DIAGNOSIS — M25561 Pain in right knee: Secondary | ICD-10-CM | POA: Diagnosis not present

## 2022-05-07 DIAGNOSIS — Z299 Encounter for prophylactic measures, unspecified: Secondary | ICD-10-CM | POA: Diagnosis not present

## 2022-05-07 DIAGNOSIS — I10 Essential (primary) hypertension: Secondary | ICD-10-CM | POA: Diagnosis not present

## 2022-05-07 DIAGNOSIS — Z79899 Other long term (current) drug therapy: Secondary | ICD-10-CM | POA: Diagnosis not present

## 2022-05-12 DIAGNOSIS — M1711 Unilateral primary osteoarthritis, right knee: Secondary | ICD-10-CM | POA: Diagnosis not present

## 2022-05-21 ENCOUNTER — Ambulatory Visit: Payer: Medicare Other | Admitting: Internal Medicine

## 2022-05-21 NOTE — Progress Notes (Deleted)
[name], female    DOB: [birth year]    MRN: [id number]   Brief patient profile:  62  yowf quit smoking 02/18/22  with stable AP  referred to pulmonary clinic in [facility]  02/25/2022 by Jonelle Sidle  for ? PF  with onset of  rhinitis/ sob several years prior to breathing problems she attributed  to covid. Aug 2022.   Has seen [facility] in past with dx of RA does not recall details of rx    History of Present Illness  02/25/2022  Pulmonary/ 1st office eval/ Melvyn Novas / [facility] last dose of macrodantin 08/01/21  Chief Complaint  Patient presents with   Consult    Consult for pulm fibrosis ref by cardiology   *patient states she had chest pain this am when walking*   Dyspnea:  vaccuum /  walking mailbox slt uphill to mailbox 100 ft and freq nitro p walk less lately  Cough: no more cough  Sleep: no resp symptoms 30 degrees  SABA use: 7 h ours prior to OV   Rec Avoid macrodantin indefinitely  GERD diet  Make sure you check your oxygen saturation  AT  your highest level of activity (not after you stop)   to be sure it stays over 90%  Please schedule a follow up visit in 3 months but call sooner if needed with pfts next available - not done as of 05/21/2022    05/21/2022  f/u ov/[place] office/Chantry Headen re: *** maint on ***  No chief complaint on file.   Dyspnea:  *** Cough: *** Sleeping: *** SABA use: *** 02: *** Covid status: *** Lung cancer screening: ***   No obvious day to day or daytime variability or assoc excess/ purulent sputum or mucus plugs or hemoptysis or cp or chest tightness, subjective wheeze or overt sinus or hb symptoms.   *** without nocturnal  or early am exacerbation  of respiratory  c/o's or need for noct saba. Also denies any obvious fluctuation of symptoms with weather or environmental changes or other aggravating or alleviating factors except as outlined above   No unusual exposure hx or h/o childhood pna/ asthma or knowledge of premature  birth.  Current Allergies, Complete Past Medical History, Past Surgical History, Family History, and Social History were reviewed in [employer] record.  ROS  The following are not active complaints unless bolded Hoarseness, sore throat, dysphagia, dental problems, itching, sneezing,  nasal congestion or discharge of excess mucus or purulent secretions, ear ache,   fever, chills, sweats, unintended wt loss or wt gain, classically pleuritic or exertional cp,  orthopnea pnd or arm/hand swelling  or leg swelling, presyncope, palpitations, abdominal pain, anorexia, nausea, vomiting, diarrhea  or change in bowel habits or change in bladder habits, change in stools or change in urine, dysuria, hematuria,  rash, arthralgias, visual complaints, headache, numbness, weakness or ataxia or problems with walking or coordination,  change in mood or  memory.        No outpatient medications have been marked as taking for the 05/21/22 encounter (Appointment) with Tanda Rockers, MD.                      Past Medical History:  Diagnosis Date   Abnormal chest CT    Anxiety    Arthritis    Rhumatoid and Osteoarthritis   Carotid artery disease ([place])    Dr. Trula Slade, s/p left CEA, severe R ICA stenosis  Coronary atherosclerosis of native coronary artery    DES RCA/circumflex 4/10, LVEF 65 -> repeat cath 11/2021 with multivessel CAD including LM stenosis, initial med rx   Depression    Essential hypertension    Fibromyalgia    GERD (gastroesophageal reflux disease)    Hiatal hernia    Hyperlipidemia    Kidney cysts    NSTEMI (non-ST elevated myocardial infarction) ([place])    4/10   Renal artery stenosis (HCC)    Left renal artery stent 12/13   Restless leg syndrome    Spondylosis without myelopathy    Tobacco abuse          Objective:       Wt Readings from Last 3 Encounters:  04/07/22 161 lb (73 kg)  02/25/22 162 lb 3.2 oz (73.6 kg)  02/04/22 160 lb (72.6 kg)       Vital signs reviewed  05/21/2022  - Note at rest 02 sats  ***% on ***   General appearance:    ***     3/6 sem -  full top dentures    Min bar***      I personally reviewed images and agree with radiology impression as follows:   Chest HRCT  01/13/22 1. Mild pulmonary fibrosis in a pattern with apical to basal gradient, featuring irregular peripheral interstitial opacity, and septal thickening, with small areas of subpleural bronchiolectasis at the lung bases, however without clear evidence of traction bronchiectasis or honeycombing.   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 Small, indistinct ground-glass nodule of the peripheral left upper lobe is unchanged, measuring 0.3 cm, nonspecific, almost certainly infectious or inflammatory. Additional tiny centrilobular pulmonary nodules, most concentrated in the lung apices, are unchanged, most commonly seen in smoking-related respiratory bronchiolitis. No follow-up needed if patient is low-risk (and has no known or suspected primary neoplasm). Non-contrast chest CT can be considered in 12 months if patient is high-risk.   3. Emphysema. 4. Coronary artery disease.      Assessment

## 2022-05-22 DIAGNOSIS — G4700 Insomnia, unspecified: Secondary | ICD-10-CM | POA: Diagnosis not present

## 2022-05-22 DIAGNOSIS — Z79891 Long term (current) use of opiate analgesic: Secondary | ICD-10-CM | POA: Diagnosis not present

## 2022-05-22 DIAGNOSIS — M130 Polyarthritis, unspecified: Secondary | ICD-10-CM | POA: Diagnosis not present

## 2022-05-22 DIAGNOSIS — M25569 Pain in unspecified knee: Secondary | ICD-10-CM | POA: Diagnosis not present

## 2022-05-22 DIAGNOSIS — M545 Low back pain, unspecified: Secondary | ICD-10-CM | POA: Diagnosis not present

## 2022-06-19 DIAGNOSIS — M130 Polyarthritis, unspecified: Secondary | ICD-10-CM | POA: Diagnosis not present

## 2022-06-19 DIAGNOSIS — Z79891 Long term (current) use of opiate analgesic: Secondary | ICD-10-CM | POA: Diagnosis not present

## 2022-06-19 DIAGNOSIS — M25569 Pain in unspecified knee: Secondary | ICD-10-CM | POA: Diagnosis not present

## 2022-06-19 DIAGNOSIS — M545 Low back pain, unspecified: Secondary | ICD-10-CM | POA: Diagnosis not present

## 2022-06-19 DIAGNOSIS — G4700 Insomnia, unspecified: Secondary | ICD-10-CM | POA: Diagnosis not present

## 2022-06-26 ENCOUNTER — Other Ambulatory Visit: Payer: Self-pay | Admitting: Cardiology

## 2022-06-27 DIAGNOSIS — G8929 Other chronic pain: Secondary | ICD-10-CM | POA: Diagnosis not present

## 2022-06-27 DIAGNOSIS — Z79891 Long term (current) use of opiate analgesic: Secondary | ICD-10-CM | POA: Diagnosis not present

## 2022-06-27 DIAGNOSIS — M25561 Pain in right knee: Secondary | ICD-10-CM | POA: Diagnosis not present

## 2022-06-27 DIAGNOSIS — M5136 Other intervertebral disc degeneration, lumbar region: Secondary | ICD-10-CM | POA: Diagnosis not present

## 2022-06-27 DIAGNOSIS — M47816 Spondylosis without myelopathy or radiculopathy, lumbar region: Secondary | ICD-10-CM | POA: Diagnosis not present

## 2022-06-27 DIAGNOSIS — M542 Cervicalgia: Secondary | ICD-10-CM | POA: Diagnosis not present

## 2022-07-10 ENCOUNTER — Encounter: Payer: Self-pay | Admitting: Internal Medicine

## 2022-07-10 ENCOUNTER — Ambulatory Visit (INDEPENDENT_AMBULATORY_CARE_PROVIDER_SITE_OTHER): Payer: Medicare Other | Admitting: Internal Medicine

## 2022-07-10 VITALS — BP 136/84 | HR 74 | Temp 98.4°F | Ht 59.0 in | Wt 161.6 lb

## 2022-07-10 DIAGNOSIS — J84112 Idiopathic pulmonary fibrosis: Secondary | ICD-10-CM

## 2022-07-10 NOTE — Progress Notes (Signed)
[name], female    DOB: Jul 05, 1951    MRN: [id number]   Brief patient profile:  34  yowf quit smoking 02/18/22  with stable AP  referred to pulmonary clinic in [facility]  02/25/2022 by [name]  for ? PF  with onset of  rhinitis/ sob several years prior to breathing problems she attributed  to covid. Aug 2022.   Has seen [name] in past with dx of RA does not recall details of rx    History of Present Illness  02/25/2022  Pulmonary/ 1st office eval/ [name] / [name] Office last dose of macrodantin 08/01/21  Chief Complaint  Patient presents with   Consult    Consult for pulm fibrosis ref by cardiology   *patient states she had chest pain this am when walking*   Dyspnea:  vaccuum /  walking mailbox slt uphill to mailbox 100 ft and freq nitro p walk less lately  Cough: no more cough  Sleep: no resp symptoms 30 degrees  SABA use: 7 hours prior to OV  Rec Avoid macrodantin indefinitely  GERD diet reviewed, bed blocks  Make sure you check your oxygen saturation  AT  your highest level of activity (not after you stop)   to be sure it stays over 90%  Please schedule a follow up visit in 3 months but call sooner if needed with pfts next available    07/10/2022  f/u ov/[place] office/[name] re: ? RA lung dz    Chief Complaint  Patient presents with   Follow-up    Breathing doing okay.  Was unsure of walking test today- having issues with her spine   Dyspnea:  limited by back pain more than breathing / no longer under care of rheum  Cough: mild daytime assoc with mucoid pnds  Sleeping: 30 degrees  s resp cc  SABA use: every few days, helps bring up sputum = mucoid  02: none  Covid status: last covid vax Aug 2021    No obvious day to day or daytime variability or assoc  purulent sputum or mucus plugs or hemoptysis or cp or chest tightness, subjective wheeze or overt sinus or hb symptoms.   Sleeping as above without nocturnal  or early am exacerbation  of respiratory  c/o's  or need for noct saba. Also denies any obvious fluctuation of symptoms with weather or environmental changes or other aggravating or alleviating factors except as outlined above   No unusual exposure hx or h/o childhood pna/ asthma or knowledge of premature birth.  Current Allergies, Complete Past Medical History, Past Surgical History, Family History, and Social History were reviewed in [employer] record.  ROS  The following are not active complaints unless bolded Hoarseness, sore throat, dysphagia, dental problems, itching, sneezing,  nasal congestion or discharge of excess mucus or purulent secretions, ear ache,   fever, chills, sweats, unintended wt loss or wt gain, classically pleuritic or exertional cp,  orthopnea pnd or arm/hand swelling  or leg swelling, presyncope, palpitations, abdominal pain, anorexia, nausea, vomiting, diarrhea  or change in bowel habits or change in bladder habits, change in stools or change in urine, dysuria, hematuria,  rash, arthralgias, visual complaints, headache, numbness, weakness or ataxia or problems with walking or coordination,  change in mood or  memory.        Current Meds  Medication Sig   albuterol (VENTOLIN HFA) 108 (90 Base) MCG/ACT inhaler 1 puff 4 (four) times daily as needed for wheezing or  shortness of breath.   amLODipine (NORVASC) 5 MG tablet Take 1 tablet (5 mg total) by mouth daily. Please call [phone] to schedule an appointment for future refills. Thank you.   aspirin 81 MG EC tablet Take 81 mg by mouth daily.   CALCIUM PO Take 1 tablet by mouth 2 (two) times daily. Gummy   cetirizine (ZYRTEC) 10 MG chewable tablet Chew 10 mg by mouth daily as needed for rhinitis.   DAYVIGO 5 MG TABS Take 5 mg by mouth at bedtime as needed (Sleep).   diazepam (VALIUM) 5 MG tablet Take 5 mg by mouth 2 (two) times daily as needed for anxiety.   diclofenac Sodium (VOLTAREN) 1 % GEL Apply 2 g topically 3 (three) times daily.    DULoxetine (CYMBALTA) 20 MG capsule Take 20 mg by mouth at bedtime.   EPINEPHrine (EPIPEN JR) 0.15 MG/0.3ML injection Inject 0.15 mg into the muscle as needed for anaphylaxis.   fluticasone (FLONASE) 50 MCG/ACT nasal spray Place 2 sprays into both nostrils daily as needed for allergies.    isosorbide mononitrate (IMDUR) 30 MG 24 hr tablet Take 1 tablet (30 mg total) by mouth daily.   levothyroxine (SYNTHROID) 25 MCG tablet Take 25 mcg by mouth daily before breakfast.   Metamucil Fiber CHEW Chew 3 tablets by mouth daily.   metoprolol succinate (TOPROL-XL) 50 MG 24 hr tablet Take 1 tablet (50 mg total) by mouth daily. Please call [phone] to schedule an appointment for future refills. Thank you.   Multiple Vitamins-Minerals (ALIVE WOMENS GUMMY PO) Take 1 tablet by mouth daily. gummy   nitroGLYCERIN (NITROSTAT) 0.4 MG SL tablet Place 1 tablet (0.4 mg total) under the tongue every 5 (five) minutes as needed for chest pain.   Omega-3 1000 MG CAPS Take 1,000 mg by mouth daily.   ondansetron (ZOFRAN) 4 MG tablet Take 4 mg by mouth 3 (three) times daily as needed for nausea.   oxyCODONE-acetaminophen (PERCOCET/ROXICET) 5-325 MG tablet Take 1 tablet by mouth 3 (three) times daily as needed for moderate pain or severe pain.   potassium chloride SA (KLOR-CON M) 20 MEQ tablet Take 1 tablet twice daily for 3 days, then stop.   rosuvastatin (CRESTOR) 40 MG tablet Take 1 tablet (40 mg total) by mouth at bedtime. Please call [phone] to schedule an appointment for future refills. Thank you.   sertraline (ZOLOFT) 50 MG tablet Take 50 mg by mouth daily.   trimethoprim-polymyxin b (POLYTRIM) ophthalmic solution Place 1 drop into the right eye in the morning, at noon, in the evening, and at bedtime.   Turmeric 500 MG CAPS Take 500 mg by mouth daily.   vitamin B-12 (CYANOCOBALAMIN) 1000 MCG tablet Take 2 tablets (2,000 mcg total) by mouth daily. (Patient taking differently: Take 1,000 mcg by mouth daily.)    Vitamin D, Ergocalciferol, (DRISDOL) 50000 UNITS CAPS Take 50,000 Units by mouth every Monday.    zinc gluconate 50 MG tablet Take 50 mg by mouth daily.                     Past Medical History:  Diagnosis Date   Abnormal chest CT    Anxiety    Arthritis    Rhumatoid and Osteoarthritis   Carotid artery disease (HCC)    Dr. Trula Slade, s/p left CEA, severe R ICA stenosis   Coronary atherosclerosis of native coronary artery    DES RCA/circumflex 4/10, LVEF 65 -> repeat cath 11/2021 with multivessel CAD including LM stenosis, initial  med rx   Depression    Essential hypertension    Fibromyalgia    GERD (gastroesophageal reflux disease)    Hiatal hernia    Hyperlipidemia    Kidney cysts    NSTEMI (non-ST elevated myocardial infarction) ([place])    4/10   Renal artery stenosis (HCC)    Left renal artery stent 12/13   Restless leg syndrome    Spondylosis without myelopathy    Tobacco abuse        Objective:       Wt Readings from Last 3 Encounters:  07/10/22 161 lb 9.6 oz (73.3 kg)  04/07/22 161 lb (73 kg)  02/25/22 162 lb 3.2 oz (73.6 kg)      Vital signs reviewed  07/10/2022  - Note at rest 02 sats  95% on RA   General appearance:    amb slow moving wf nad       HEENT : Oropharynx  clear / top dentures      NECK :  without  apparent JVD/ palpable Nodes/TM    LUNGS: no acc muscle use,  Min barrel  contour chest wall with bilateral  slightly decreased bs s audible wheeze and  without cough on insp or exp maneuvers and min  Hyperresonant  to  percussion bilaterally    CV:  RRR  no s3  with 2-3/6 SEM s increase in P2, and no edema   ABD:  soft and nontender    MS:    ext warm without deformities Or obvious joint restrictions  calf tenderness, cyanosis or clubbing     SKIN: warm and dry without lesions    NEURO:  alert, approp, nl sensorium with  no motor or cerebellar deficits apparent.            Assessment

## 2022-07-10 NOTE — Patient Instructions (Signed)
Make sure you check your oxygen saturation at your highest level of activity (NOT after you stop)  to be sure it stays over 90% and keep track of it at least once a week, more often if breathing getting worse, and let me know if losing ground. (Collect the dots to connect the dots approach)    Please schedule a follow up visit in 3-4  months but call sooner if needed with pfts on return

## 2022-07-12 ENCOUNTER — Encounter: Payer: Self-pay | Admitting: Internal Medicine

## 2022-07-12 NOTE — Assessment & Plan Note (Addendum)
Onset ? In setting of low grade RA  - macrodantin exposure ended 08/01/21  - HRCT chest 01/13/22  prob uip/ emphysema  - 02/25/2022   Walked on RA  x  3  lap(s) =  approx 450  ft  @ slow pace, stopped due to end of study with lowest 02 sats 90% - 02/25/2022 rec gerd diet / serial walking sats  - collagen vasc profile 02/25/2022  Nl esr 17   - HSP  Serology 02/25/2022   Neg  - 07/10/2022   Walked on RA  x  1  lap(s) =  approx 150  ft  @ slow/ cane assist pace, stopped due to back pain  with lowest 02 sats 91%    Suspect she would be more symptomatic if more mobile but now limited by back so rec f/u rheum and monitor sats:  >>> Make sure you check your oxygen saturation at your highest level of activity(NOT after you stop)  to be sure it stays over 90% and keep track of it at least once a week, more often if breathing getting worse, and let me know if losing ground. (Collect the dots to connect the dots approach)    F/u with pfts in 3-4 months, call sooner if needed   Each maintenance medication was reviewed in detail including emphasizing most importantly the difference between maintenance and prns and under what circumstances the prns are to be triggered using an action plan format where appropriate.  Total time for H and P, chart review, counseling, reviewing hfa device(s) , directly observing portions of ambulatory 02 saturation study/ and generating customized AVS unique to this office visit / same day charting  > 30 min

## 2022-07-29 ENCOUNTER — Other Ambulatory Visit: Payer: Self-pay | Admitting: Cardiology

## 2022-07-30 ENCOUNTER — Other Ambulatory Visit: Payer: Self-pay | Admitting: Cardiology

## 2022-08-14 DIAGNOSIS — M47816 Spondylosis without myelopathy or radiculopathy, lumbar region: Secondary | ICD-10-CM | POA: Diagnosis not present

## 2022-09-01 DIAGNOSIS — M179 Osteoarthritis of knee, unspecified: Secondary | ICD-10-CM | POA: Diagnosis not present

## 2022-09-01 DIAGNOSIS — Z299 Encounter for prophylactic measures, unspecified: Secondary | ICD-10-CM | POA: Diagnosis not present

## 2022-09-01 DIAGNOSIS — I10 Essential (primary) hypertension: Secondary | ICD-10-CM | POA: Diagnosis not present

## 2022-09-04 ENCOUNTER — Ambulatory Visit: Payer: 59 | Attending: Cardiology | Admitting: Cardiology

## 2022-09-04 ENCOUNTER — Encounter: Payer: Self-pay | Admitting: Cardiology

## 2022-09-04 VITALS — BP 110/64 | HR 63 | Ht 59.0 in | Wt 157.0 lb

## 2022-09-04 DIAGNOSIS — I25119 Atherosclerotic heart disease of native coronary artery with unspecified angina pectoris: Secondary | ICD-10-CM | POA: Diagnosis not present

## 2022-09-04 DIAGNOSIS — I10 Essential (primary) hypertension: Secondary | ICD-10-CM

## 2022-09-04 DIAGNOSIS — I6523 Occlusion and stenosis of bilateral carotid arteries: Secondary | ICD-10-CM

## 2022-09-04 NOTE — Progress Notes (Signed)
Cardiology Office Note  Date: 09/04/2022   ID: [name], [name] 1951-06-08, MRN [id number]  PCP:  Glenda Chroman, MD  Cardiologist:  Rozann Lesches, MD Electrophysiologist:  None   Chief Complaint  Patient presents with   Cardiac follow-up    History of Present Illness: [name] is a 72 y.o. female last seen in July 2023.  She is here for a routine visit.  Fortunately, reports no obvious angina, denies any nitroglycerin use.  She has been having a lot of trouble with lower back pain and undergoing pain control injections, sees Dr. Francesco Runner.  She has complex cardiac history with severe left main/multivessel CAD. Patient was evaluated by Dr. Cyndia Bent in May of last year and not felt to be a surgical candidate due to extensive aortic calcification extending into the great vessels, also off-pump LIMA to LAD not an option due to subclavian stenosis. The interventional team felt that PCI would be very high risk and therefore medical therapy was ultimately recommended.   Follow-up with Dr. Trula Slade noted in September 2023, I reviewed the note.  We went over her medications, she had stopped aspirin somewhere along the way and I recommended that she resume an 81 mg dose.  She is otherwise on Norvasc, Imdur, Toprol-XL, Crestor, and as needed nitroglycerin.  Her last LDL was 58 in October 2023.  Past Medical History:  Diagnosis Date   Abnormal chest CT    Anxiety    Arthritis    Rhumatoid and Osteoarthritis   Carotid artery disease (HCC)    Dr. Trula Slade, s/p left CEA, severe R ICA stenosis   Coronary atherosclerosis of native coronary artery    DES RCA/circumflex 4/10, LVEF 65 -> repeat cath 11/2021 with multivessel CAD including LM stenosis, initial med rx   Depression    Essential hypertension    Fibromyalgia    GERD (gastroesophageal reflux disease)    Hiatal hernia    Hyperlipidemia    Kidney cysts    NSTEMI (non-ST elevated myocardial infarction) ([place])    4/10   Renal artery  stenosis (HCC)    Left renal artery stent 12/13   Restless leg syndrome    Spondylosis without myelopathy    Tobacco abuse     Current Outpatient Medications  Medication Sig Dispense Refill   albuterol (VENTOLIN HFA) 108 (90 Base) MCG/ACT inhaler 1 puff 4 (four) times daily as needed for wheezing or shortness of breath.     amLODipine (NORVASC) 5 MG tablet Take 1 tablet (5 mg total) by mouth daily. 30 tablet 2   aspirin 81 MG EC tablet Take 81 mg by mouth daily.     CALCIUM PO Take 1 tablet by mouth daily. Gummy     diazepam (VALIUM) 5 MG tablet Take 5 mg by mouth 2 (two) times daily as needed for anxiety.     diclofenac Sodium (VOLTAREN) 1 % GEL Apply 2 g topically 3 (three) times daily.     DULoxetine (CYMBALTA) 20 MG capsule Take 20 mg by mouth at bedtime.     EPINEPHrine (EPIPEN JR) 0.15 MG/0.3ML injection Inject 0.15 mg into the muscle as needed for anaphylaxis.     isosorbide mononitrate (IMDUR) 30 MG 24 hr tablet Take 1 tablet (30 mg total) by mouth daily. 30 tablet 5   levothyroxine (SYNTHROID) 25 MCG tablet Take 25 mcg by mouth daily before breakfast.     metoprolol succinate (TOPROL-XL) 50 MG 24 hr tablet Take 1 tablet (50 mg total)  by mouth daily. Please call [phone] to schedule an appointment for future refills. Thank you. 30 tablet 2   Multiple Vitamins-Minerals (ALIVE WOMENS GUMMY PO) Take 1 tablet by mouth daily. gummy     nitroGLYCERIN (NITROSTAT) 0.4 MG SL tablet Place 1 tablet (0.4 mg total) under the tongue every 5 (five) minutes as needed for chest pain. 30 tablet 12   Omega-3 1000 MG CAPS Take 1,000 mg by mouth daily.     ondansetron (ZOFRAN) 4 MG tablet Take 4 mg by mouth 3 (three) times daily as needed for nausea.     oxyCODONE-acetaminophen (PERCOCET/ROXICET) 5-325 MG tablet Take 1 tablet by mouth 3 (three) times daily as needed for moderate pain or severe pain.     rosuvastatin (CRESTOR) 40 MG tablet Take 1 tablet (40 mg total) by mouth at bedtime. Please call  [phone] to schedule an appointment for future refills. Thank you. 30 tablet 9   sertraline (ZOLOFT) 50 MG tablet Take 50 mg by mouth daily.     Turmeric 500 MG CAPS Take 500 mg by mouth daily.     vitamin B-12 (CYANOCOBALAMIN) 1000 MCG tablet Take 2 tablets (2,000 mcg total) by mouth daily. (Patient taking differently: Take 1,000 mcg by mouth daily.) 60 tablet 0   Vitamin D, Ergocalciferol, (DRISDOL) 50000 UNITS CAPS Take 50,000 Units by mouth every Monday.      zinc gluconate 50 MG tablet Take 50 mg by mouth daily.     No current facility-administered medications for this visit.   Allergies:  Celecoxib, Citalopram, Morphine and related, Ciprofloxacin hcl, Dicyclomine, Lamotrigine, Metoclopramide, Milnacipran hcl, Omeprazole, Pregabalin, Serotonin reuptake inhibitors (ssris), Sertraline hcl, Simvastatin, Tizanidine, Trazodone and nefazodone, Venlafaxine, Codeine, Metoclopramide hcl, Morphine, Nefazodone, Sertraline, Tape, and Sertraline hcl   ROS: No palpitations or syncope.  Physical Exam: VS:  BP 110/64   Pulse 63   Ht 4' 11"$  (1.499 m)   Wt 157 lb (71.2 kg)   SpO2 97%   BMI 31.71 kg/m , BMI Body mass index is 31.71 kg/m.  Wt Readings from Last 3 Encounters:  09/04/22 157 lb (71.2 kg)  07/10/22 161 lb 9.6 oz (73.3 kg)  04/07/22 161 lb (73 kg)    General: Patient appears comfortable at rest. HEENT: Conjunctiva and lids normal. Neck: Supple, no elevated JVP, right carotid bruit. Lungs: Clear to auscultation, nonlabored breathing at rest. Cardiac: Regular rate and rhythm, no S3, 2/6 systolic murmur. Extremities: No pitting edema.  ECG:  An ECG dated 11/11/2021 was personally reviewed today and demonstrated:  Sinus rhythm.  Recent Labwork: 02/04/2022: BUN 21; Creatinine, Ser 1.31; Potassium 4.0; Sodium 139 02/25/2022: Hemoglobin 11.9; Platelets 175   Other Studies Reviewed Today:  Carotid Dopplers 09/19/2021: 80 to 99% RICA stenosis, 1 to [id number] LICA stenosis.   Echocardiogram  09/19/2021:  1. Left ventricular ejection fraction, by estimation, is 60 to 65%. The  left ventricle has normal function. The left ventricle has no regional  wall motion abnormalities. Left ventricular diastolic parameters are  consistent with Grade I diastolic  dysfunction (impaired relaxation). Elevated left atrial pressure.   2. Right ventricular systolic function is normal. The right ventricular  size is normal.   3. The mitral valve is abnormal. Mild mitral valve regurgitation. No  evidence of mitral stenosis.   4. The aortic valve is tricuspid. There is mild calcification of the  aortic valve. There is mild thickening of the aortic valve. Aortic valve  regurgitation is not visualized. No aortic stenosis is present.  Cardiac catheterization 11/13/2021:   Ost LM to Mid LM lesion is 90% stenosed.   Mid Cx lesion is 20% stenosed.   Mid Cx to Dist Cx lesion is 70% stenosed.   Prox Cx lesion is 60% stenosed.   Ost RCA to Prox RCA lesion is 20% stenosed.   Prox RCA to Mid RCA lesion is 30% stenosed.   RPDA lesion is 90% stenosed.   Prox RCA lesion is 60% stenosed.   RPAV lesion is 60% stenosed.   1.  Severe calcified ostial left main stenosis with significant pressure dampening with catheter engagement.  Patent left circumflex disease with mild in-stent restenosis and progression of native vessel disease in the left circumflex.  Patent RCA stents with diffuse disease in multiple areas with subtotal occlusion of the right PDA with well-developed left-to-right collaterals. 2.  Left ventricular angiography was not performed.  EF was normal by echo.  Severely elevated blood pressure with moderately elevated left ventricular end-diastolic pressure and no significant gradient across the aortic valve.  Assessment and Plan:  1.  Left main/multivessel CAD with poor revascularization options as discussed above.  She reports no active angina on medical therapy and we will continue with observation  for now.  Resuming aspirin 81 mg daily.  Otherwise continue Norvasc, Imdur, Toprol-XL, Crestor, and as needed nitroglycerin.  2.  Asymptomatic carotid artery disease with severe RICA stenosis, managed conservatively with follow-up by Dr. Trula Slade.  She is high risk for revascularization.  Continue aspirin and Crestor.  Recent LDL 58.  3.  Essential hypertension, blood pressure is well-controlled today.  No changes were made.  Medication Adjustments/Labs and Tests Ordered: Current medicines are reviewed at length with the patient today.  Concerns regarding medicines are outlined above.   Tests Ordered: No orders of the defined types were placed in this encounter.   Medication Changes: No orders of the defined types were placed in this encounter.   Disposition:  Follow up  6 months.  Signed, Satira Sark, MD, [facility] 09/04/2022 2:51 PM    [place] [facility] [address], [place], [place] [address] Phone: [phone]; Fax: [phone]

## 2022-09-04 NOTE — Patient Instructions (Signed)
Medication Instructions:  Your physician recommends that you continue on your current medications as directed. Please refer to the Current Medication list given to you today.   Labwork: None today  Testing/Procedures: None today  Follow-Up: 6 months  Any Other Special Instructions Will Be Listed Below (If Applicable).  If you need a refill on your cardiac medications before your next appointment, please call your pharmacy.  

## 2022-09-27 ENCOUNTER — Other Ambulatory Visit: Payer: Self-pay | Admitting: Physician Assistant

## 2022-10-06 ENCOUNTER — Ambulatory Visit (HOSPITAL_COMMUNITY)
Admission: RE | Admit: 2022-10-06 | Discharge: 2022-10-06 | Disposition: A | Discharge status: Home / Self Care | Payer: 59 | Source: Ambulatory Visit | Attending: Surgery | Admitting: Surgery

## 2022-10-06 ENCOUNTER — Encounter: Payer: Self-pay | Admitting: Surgery

## 2022-10-06 ENCOUNTER — Ambulatory Visit (INDEPENDENT_AMBULATORY_CARE_PROVIDER_SITE_OTHER): Payer: 59 | Admitting: Surgery

## 2022-10-06 VITALS — BP 129/65 | HR 66 | Temp 98.6°F | Resp 20 | Ht 59.0 in | Wt 162.0 lb

## 2022-10-06 DIAGNOSIS — I6523 Occlusion and stenosis of bilateral carotid arteries: Secondary | ICD-10-CM

## 2022-10-06 NOTE — Progress Notes (Signed)
[facility] [place]  Patient name: [name] MRN: [id number] DOB: 13-Jul-1951 Sex: female   REASON FOR VISIT:    Follow up  HISOTRY OF PRESENT ILLNESS:    [name] is a 72 y.o. female who is status post left carotid endarterectomy in 2010 for symptomatic stenosis.  She had extensive plaque which extended into her proximal common carotid artery.  I sent her for CT scan for further evaluation of her right-sided carotid disease.  Because of the extensive plaque, I do not think she is a good TCAR candidate.  I felt her best option was going to be a right carotid endarterectomy with retrograde stenting of the ostium if indicated.  She was also going to require intervention of recurrent mid left common carotid artery stenosis via redo carotid endarterectomy.  Because of her persistent cardiac symptoms, she was sent for formal clearance.  She had a cardiac catheterization.  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  She was also turned down by cardiology for high risk PCI.  Medical management has been favored for her coronary disease.  Because she was asymptomatic from her carotid perspective, I elected to monitor her without intervention.  She is back today for follow-up she denies any neurologic symptoms.  She denies numbness or weakness in either extremity.  She denies slurred speech.  She denies emesis features.  She continues to have intermittent chest pain.   I performed left renal artery stenting for hypertension on 06/15/2012.  She developed a recurrence and underwent angioplasty on 05/26/2017.  The patient is a former smoker.  She takes a statin for hypercholesterolemia.  She has a history of coronary artery disease, status post MI.  She has persistent angina.   PAST MEDICAL HISTORY:   Past Medical History:  Diagnosis Date   Abnormal chest CT    Anxiety    Arthritis    Rhumatoid and  Osteoarthritis   Carotid artery disease (HCC)    Dr. Trula Slade, s/p left CEA, severe R ICA stenosis   Coronary atherosclerosis of native coronary artery    DES RCA/circumflex 4/10, LVEF 65 -> repeat cath 11/2021 with multivessel CAD including LM stenosis, initial med rx   Depression    Essential hypertension    Fibromyalgia    GERD (gastroesophageal reflux disease)    Hiatal hernia    Hyperlipidemia    Kidney cysts    NSTEMI (non-ST elevated myocardial infarction) ([facility])    4/10   Renal artery stenosis (HCC)    Left renal artery stent 12/13   Restless leg syndrome    Spondylosis without myelopathy    Tobacco abuse      FAMILY HISTORY:   Family History  Problem Relation Age of Onset   Other Mother        Cerebral hemorrage   Heart disease Mother    Heart attack Mother    Hyperlipidemia Mother    Heart disease Father    Heart attack Father    Hyperlipidemia Father    Heart disease Brother        Heart Disease before age 24   Diabetes Brother    Hypertension Brother    Heart attack Brother    Cancer Sister    Arthritis Sister    Heart disease Sister    Heart disease Daughter 44       Heart Disease before age 86   Heart attack Daughter  Hypertension Daughter    Cancer Sister    Arthritis Sister    Diabetes Brother        Varicose  Veins   Peripheral vascular disease Brother    Diabetes Brother     SOCIAL HISTORY:   Social History   Tobacco Use   Smoking status: Former    Years: 78    Types: Cigarettes    Quit date: 02/18/2022    Years since quitting: 0.6   Smokeless tobacco: Never  Substance Use Topics   Alcohol use: No    Alcohol/week: 0.0 standard drinks of alcohol     ALLERGIES:   Allergies  Allergen Reactions   Celecoxib Itching and Other (See Comments)    Other Reaction(s): Not available   Citalopram Other (See Comments) and Palpitations    Heart rate increased   Morphine And Related Other (See Comments) and Itching    Patient states she is  not allergic to morphine; states had morphine with no reaction   Ciprofloxacin Hcl Other (See Comments)    Mya have made her sick or bad diarrhea   Dicyclomine Diarrhea and Other (See Comments)    Made diarrhea worse   Lamotrigine Other (See Comments) and Diarrhea    Unknown   Metoclopramide Other (See Comments)    Dizziness   Milnacipran Hcl Other (See Comments)    Unknown   Omeprazole Other (See Comments)   Pregabalin Other (See Comments)   Serotonin Reuptake Inhibitors (Ssris) Other (See Comments)   Sertraline Hcl Other (See Comments)    Stomach upset   Simvastatin Other (See Comments)    Increased back and muscle pain   Tizanidine Other (See Comments)    unknown   Trazodone And Nefazodone Other (See Comments)    hallucinations   Venlafaxine Other (See Comments)    Affected eyes, blurred vision   Codeine Itching   Metoclopramide Hcl Other (See Comments)    Dizziness    Morphine Other (See Comments)   Nefazodone Other (See Comments)   Sertraline Other (See Comments)   Tape Other (See Comments)    Skin gets red and skin pulls off, paper tape only   Sertraline Hcl Other (See Comments)    Stomach upset     CURRENT MEDICATIONS:   Current Outpatient Medications  Medication Sig Dispense Refill   albuterol (VENTOLIN HFA) 108 (90 Base) MCG/ACT inhaler 1 puff 4 (four) times daily as needed for wheezing or shortness of breath.     amLODipine (NORVASC) 5 MG tablet Take 1 tablet (5 mg total) by mouth daily. 30 tablet 2   aspirin 81 MG EC tablet Take 81 mg by mouth daily.     CALCIUM PO Take 1 tablet by mouth daily. Gummy     diazepam (VALIUM) 5 MG tablet Take 5 mg by mouth 2 (two) times daily as needed for anxiety.     diclofenac Sodium (VOLTAREN) 1 % GEL Apply 2 g topically 3 (three) times daily.     DULoxetine (CYMBALTA) 20 MG capsule Take 20 mg by mouth at bedtime.     EPINEPHrine (EPIPEN JR) 0.15 MG/0.3ML injection Inject 0.15 mg into the muscle as needed for anaphylaxis.      isosorbide mononitrate (IMDUR) 30 MG 24 hr tablet TAKE ONE TABLET BY MOUTH ONCE DAILY 30 tablet 5   levothyroxine (SYNTHROID) 25 MCG tablet Take 25 mcg by mouth daily before breakfast.     metoprolol succinate (TOPROL-XL) 50 MG 24 hr tablet Take 1 tablet (50  mg total) by mouth daily. Please call [phone] to schedule an appointment for future refills. Thank you. 30 tablet 2   Multiple Vitamins-Minerals (ALIVE WOMENS GUMMY PO) Take 1 tablet by mouth daily. gummy     nitroGLYCERIN (NITROSTAT) 0.4 MG SL tablet Place 1 tablet (0.4 mg total) under the tongue every 5 (five) minutes as needed for chest pain. 30 tablet 12   Omega-3 1000 MG CAPS Take 1,000 mg by mouth daily.     ondansetron (ZOFRAN) 4 MG tablet Take 4 mg by mouth 3 (three) times daily as needed for nausea.     oxyCODONE-acetaminophen (PERCOCET/ROXICET) 5-325 MG tablet Take 1 tablet by mouth 3 (three) times daily as needed for moderate pain or severe pain.     rosuvastatin (CRESTOR) 40 MG tablet Take 1 tablet (40 mg total) by mouth at bedtime. Please call [phone] to schedule an appointment for future refills. Thank you. 30 tablet 9   sertraline (ZOLOFT) 50 MG tablet Take 50 mg by mouth daily.     Turmeric 500 MG CAPS Take 500 mg by mouth daily.     vitamin B-12 (CYANOCOBALAMIN) 1000 MCG tablet Take 2 tablets (2,000 mcg total) by mouth daily. (Patient taking differently: Take 1,000 mcg by mouth daily.) 60 tablet 0   Vitamin D, Ergocalciferol, (DRISDOL) 50000 UNITS CAPS Take 50,000 Units by mouth every Monday.      zinc gluconate 50 MG tablet Take 50 mg by mouth daily.     No current facility-administered medications for this visit.    REVIEW OF SYSTEMS:   [X]  denotes positive finding, [ ]  denotes negative finding Cardiac  Comments:  Chest pain or chest pressure:    Shortness of breath upon exertion:    Short of breath when lying flat:    Irregular heart rhythm:        Vascular    Pain in calf, thigh, or hip brought on by  ambulation:    Pain in feet at night that wakes you up from your sleep:     Blood clot in your veins:    Leg swelling:         Pulmonary    Oxygen at home:    Productive cough:     Wheezing:         Neurologic    Sudden weakness in arms or legs:     Sudden numbness in arms or legs:     Sudden onset of difficulty speaking or slurred speech:    Temporary loss of vision in one eye:     Problems with dizziness:         Gastrointestinal    Blood in stool:     Vomited blood:         Genitourinary    Burning when urinating:     Blood in urine:        Psychiatric    Major depression:         Hematologic    Bleeding problems:    Problems with blood clotting too easily:        Skin    Rashes or ulcers:        Constitutional    Fever or chills:      PHYSICAL EXAM:   Vitals:   10/06/22 1105 10/06/22 1106  BP: 134/69 129/65  Pulse: 66   Resp: 20   Temp: 98.6 F (37 C)   Weight: 162 lb (73.5 kg)   Height: 4\' 11"  (1.499 m)  GENERAL: The patient is a well-nourished female, in no acute distress. The vital signs are documented above. CARDIAC: There is a regular rate and rhythm.  PULMONARY: Non-labored respirations MUSCULOSKELETAL: There are no major deformities or cyanosis. NEUROLOGIC: No focal weakness or paresthesias are detected. SKIN: There are no ulcers or rashes noted. PSYCHIATRIC: The patient has a normal affect.  STUDIES:   I have reviewed the following carotid duplex: Right Carotid: Velocities in the right ICA are consistent with a 80-99%                 stenosis. Hemodynamically significant plaque >50%  visualized in                 the proximal and distal CCA. The ECA appears >50% stenosed.   Left Carotid: Velocities in the left ICA are consistent with a 1-39%  stenosis.               Hemodynamically significant plaque >50% visualized in the  mid and                distal CCA.   Vertebrals:  Bilateral vertebral arteries demonstrate antegrade flow.  High               velocities in the left vertebral.  Subclavians: Bilateral subclavian arteries were stenotic. Bilateral  subclavian              artery flow was disturbed.    MEDICAL ISSUES:   Carotid: She remains asymptomatic.  She has a high-grade stenosis on the right which extends down into the chest as well as a stenosis on the left.  She was sent for cardiac clearance.  She was turned down for high risk PCI as well as CABG.  I discussed that I am reluctant to proceed with carotid endarterectomy knowing that she has underlying coronary disease which cannot be addressed.  I will continue to follow her with a ultrasound in 6 months.  If she has symptoms between now and then she would need repair.    Leia Alf, MD, FACS [facility] [phone] Pager [phone]

## 2022-10-14 DIAGNOSIS — Z7189 Other specified counseling: Secondary | ICD-10-CM | POA: Diagnosis not present

## 2022-10-14 DIAGNOSIS — J449 Chronic obstructive pulmonary disease, unspecified: Secondary | ICD-10-CM | POA: Diagnosis not present

## 2022-10-14 DIAGNOSIS — I25119 Atherosclerotic heart disease of native coronary artery with unspecified angina pectoris: Secondary | ICD-10-CM | POA: Diagnosis not present

## 2022-10-14 DIAGNOSIS — I10 Essential (primary) hypertension: Secondary | ICD-10-CM | POA: Diagnosis not present

## 2022-10-14 DIAGNOSIS — Z0000 Encounter for general adult medical examination without abnormal findings: Secondary | ICD-10-CM | POA: Diagnosis not present

## 2022-10-14 DIAGNOSIS — I700 Atherosclerosis of aorta: Secondary | ICD-10-CM | POA: Diagnosis not present

## 2022-10-14 DIAGNOSIS — Z299 Encounter for prophylactic measures, unspecified: Secondary | ICD-10-CM | POA: Diagnosis not present

## 2022-10-17 ENCOUNTER — Other Ambulatory Visit: Payer: Self-pay | Admitting: Cardiology

## 2022-10-20 DIAGNOSIS — M47816 Spondylosis without myelopathy or radiculopathy, lumbar region: Secondary | ICD-10-CM | POA: Diagnosis not present

## 2022-10-20 DIAGNOSIS — M5459 Other low back pain: Secondary | ICD-10-CM | POA: Diagnosis not present

## 2022-10-21 ENCOUNTER — Ambulatory Visit (HOSPITAL_COMMUNITY): Admission: RE | Admit: 2022-10-21 | Payer: 59 | Source: Ambulatory Visit

## 2022-11-05 NOTE — Progress Notes (Deleted)
[name], female    DOB: 21-Jul-1950    MRN: [id number]   Brief patient profile:  18 yowf quit smoking 02/18/22  with stable AP  referred to pulmonary clinic in [facility]  02/25/2022 by [name]  for ? PF  with onset of  rhinitis/ sob several years prior to breathing problems she attributed  to covid. Aug 2022.   Has seen [name] in past with dx of RA does not recall details of rx    History of Present Illness  02/25/2022  Pulmonary/ 1st office eval/ [name] / [name] Office last dose of macrodantin 08/01/21  Chief Complaint  Patient presents with   Consult    Consult for pulm fibrosis ref by cardiology   *patient states she had chest pain this am when walking*   Dyspnea:  vaccuum /  walking mailbox slt uphill to mailbox 100 ft and freq nitro p walk less lately  Cough: no more cough  Sleep: no resp symptoms 30 degrees  SABA use: 7 hours prior to OV  Rec Avoid macrodantin indefinitely  GERD diet reviewed, bed blocks  Make sure you check your oxygen saturation  AT  your highest level of activity (not after you stop)   to be sure it stays over 90%  Please schedule a follow up visit in 3 months but call sooner if needed with pfts next available    07/10/2022  f/u ov/[name] office/[name] re: ? RA lung dz    Chief Complaint  Patient presents with   Follow-up    Breathing doing okay.  Was unsure of walking test today- having issues with her spine   Dyspnea:  limited by back pain more than breathing / no longer under care of rheum  Cough: mild daytime assoc with mucoid pnds  Sleeping: 30 degrees  s resp cc  SABA use: every few days, helps bring up sputum = mucoid  02: none  Covid status: last covid vax Aug 2021 Rec Make sure you check your oxygen saturation at your highest level of activity (NOT after you stop)  to be sure it stays over 90%     11/07/2022  f/u ov/[name] office/[name] re: ? RA lung dz  maint on ***  No chief complaint on file.   Dyspnea:   *** Cough: *** Sleeping: *** SABA use: *** 02: *** Covid status: *** Lung cancer screening: ***   No obvious day to day or daytime variability or assoc excess/ purulent sputum or mucus plugs or hemoptysis or cp or chest tightness, subjective wheeze or overt sinus or hb symptoms.   *** without nocturnal  or early am exacerbation  of respiratory  c/o's or need for noct saba. Also denies any obvious fluctuation of symptoms with weather or environmental changes or other aggravating or alleviating factors except as outlined above   No unusual exposure hx or h/o childhood pna/ asthma or knowledge of premature birth.  Current Allergies, Complete Past Medical History, Past Surgical History, Family History, and Social History were reviewed in [name] record.  ROS  The following are not active complaints unless bolded Hoarseness, sore throat, dysphagia, dental problems, itching, sneezing,  nasal congestion or discharge of excess mucus or purulent secretions, ear ache,   fever, chills, sweats, unintended wt loss or wt gain, classically pleuritic or exertional cp,  orthopnea pnd or arm/hand swelling  or leg swelling, presyncope, palpitations, abdominal pain, anorexia, nausea, vomiting, diarrhea  or change in bowel habits or change in  bladder habits, change in stools or change in urine, dysuria, hematuria,  rash, arthralgias, visual complaints, headache, numbness, weakness or ataxia or problems with walking or coordination,  change in mood or  memory.        No outpatient medications have been marked as taking for the 11/07/22 encounter (Appointment) with Heather Cowden, MD.                       Past Medical History:  Diagnosis Date   Abnormal chest CT    Anxiety    Arthritis    Rhumatoid and Osteoarthritis   Carotid artery disease (HCC)    [name], s/p left CEA, severe R ICA stenosis   Coronary atherosclerosis of native coronary artery    DES RCA/circumflex  4/10, LVEF 65 -> repeat cath 11/2021 with multivessel CAD including LM stenosis, initial med rx   Depression    Essential hypertension    Fibromyalgia    GERD (gastroesophageal reflux disease)    Hiatal hernia    Hyperlipidemia    Kidney cysts    NSTEMI (non-ST elevated myocardial infarction) (HCC)    4/10   Renal artery stenosis (HCC)    Left renal artery stent 12/13   Restless leg syndrome    Spondylosis without myelopathy    Tobacco abuse        Objective:       11/07/2022        ***   07/10/22 161 lb 9.6 oz (73.3 kg)  04/07/22 161 lb (73 kg)  02/25/22 162 lb 3.2 oz (73.6 kg)      Vital signs reviewed  11/07/2022  - Note at rest 02 sats  ***% on ***   General appearance:    ***   Min barr  2-3/6 SEM***         Assessment

## 2022-11-07 ENCOUNTER — Ambulatory Visit: Payer: 59 | Admitting: Internal Medicine

## 2022-11-14 DIAGNOSIS — D692 Other nonthrombocytopenic purpura: Secondary | ICD-10-CM | POA: Diagnosis not present

## 2022-11-14 DIAGNOSIS — J449 Chronic obstructive pulmonary disease, unspecified: Secondary | ICD-10-CM | POA: Diagnosis not present

## 2022-11-14 DIAGNOSIS — Z299 Encounter for prophylactic measures, unspecified: Secondary | ICD-10-CM | POA: Diagnosis not present

## 2022-11-14 DIAGNOSIS — I10 Essential (primary) hypertension: Secondary | ICD-10-CM | POA: Diagnosis not present

## 2022-11-16 ENCOUNTER — Emergency Department (HOSPITAL_COMMUNITY): Payer: 59

## 2022-11-16 ENCOUNTER — Other Ambulatory Visit: Payer: Self-pay

## 2022-11-16 ENCOUNTER — Encounter (HOSPITAL_COMMUNITY): Payer: Self-pay | Admitting: Emergency Medicine

## 2022-11-16 ENCOUNTER — Inpatient Hospital Stay (HOSPITAL_COMMUNITY)
Admission: EM | Admit: 2022-11-16 | Discharge: 2022-11-20 | DRG: 322 | Disposition: A | Discharge status: Home / Self Care | Payer: 59 | Attending: Internal Medicine | Admitting: Internal Medicine

## 2022-11-16 DIAGNOSIS — I700 Atherosclerosis of aorta: Secondary | ICD-10-CM | POA: Diagnosis present

## 2022-11-16 DIAGNOSIS — Z79890 Hormone replacement therapy (postmenopausal): Secondary | ICD-10-CM

## 2022-11-16 DIAGNOSIS — N1832 Chronic kidney disease, stage 3b: Secondary | ICD-10-CM | POA: Insufficient documentation

## 2022-11-16 DIAGNOSIS — Z7982 Long term (current) use of aspirin: Secondary | ICD-10-CM

## 2022-11-16 DIAGNOSIS — I214 Non-ST elevation (NSTEMI) myocardial infarction: Secondary | ICD-10-CM | POA: Diagnosis not present

## 2022-11-16 DIAGNOSIS — K219 Gastro-esophageal reflux disease without esophagitis: Secondary | ICD-10-CM | POA: Diagnosis not present

## 2022-11-16 DIAGNOSIS — M797 Fibromyalgia: Secondary | ICD-10-CM | POA: Diagnosis not present

## 2022-11-16 DIAGNOSIS — I739 Peripheral vascular disease, unspecified: Secondary | ICD-10-CM | POA: Diagnosis present

## 2022-11-16 DIAGNOSIS — I2584 Coronary atherosclerosis due to calcified coronary lesion: Secondary | ICD-10-CM | POA: Diagnosis not present

## 2022-11-16 DIAGNOSIS — Z87891 Personal history of nicotine dependence: Secondary | ICD-10-CM

## 2022-11-16 DIAGNOSIS — Z6832 Body mass index (BMI) 32.0-32.9, adult: Secondary | ICD-10-CM

## 2022-11-16 DIAGNOSIS — I129 Hypertensive chronic kidney disease with stage 1 through stage 4 chronic kidney disease, or unspecified chronic kidney disease: Secondary | ICD-10-CM | POA: Diagnosis not present

## 2022-11-16 DIAGNOSIS — E782 Mixed hyperlipidemia: Secondary | ICD-10-CM | POA: Diagnosis not present

## 2022-11-16 DIAGNOSIS — I200 Unstable angina: Secondary | ICD-10-CM | POA: Diagnosis present

## 2022-11-16 DIAGNOSIS — I358 Other nonrheumatic aortic valve disorders: Secondary | ICD-10-CM | POA: Diagnosis not present

## 2022-11-16 DIAGNOSIS — Z79899 Other long term (current) drug therapy: Secondary | ICD-10-CM | POA: Diagnosis not present

## 2022-11-16 DIAGNOSIS — Z9049 Acquired absence of other specified parts of digestive tract: Secondary | ICD-10-CM

## 2022-11-16 DIAGNOSIS — G2581 Restless legs syndrome: Secondary | ICD-10-CM | POA: Diagnosis not present

## 2022-11-16 DIAGNOSIS — E7849 Other hyperlipidemia: Secondary | ICD-10-CM | POA: Diagnosis not present

## 2022-11-16 DIAGNOSIS — Z8261 Family history of arthritis: Secondary | ICD-10-CM

## 2022-11-16 DIAGNOSIS — Z955 Presence of coronary angioplasty implant and graft: Secondary | ICD-10-CM

## 2022-11-16 DIAGNOSIS — E785 Hyperlipidemia, unspecified: Secondary | ICD-10-CM | POA: Diagnosis present

## 2022-11-16 DIAGNOSIS — I252 Old myocardial infarction: Secondary | ICD-10-CM | POA: Diagnosis not present

## 2022-11-16 DIAGNOSIS — Z96653 Presence of artificial knee joint, bilateral: Secondary | ICD-10-CM | POA: Diagnosis present

## 2022-11-16 DIAGNOSIS — E039 Hypothyroidism, unspecified: Secondary | ICD-10-CM | POA: Insufficient documentation

## 2022-11-16 DIAGNOSIS — I249 Acute ischemic heart disease, unspecified: Secondary | ICD-10-CM

## 2022-11-16 DIAGNOSIS — Z888 Allergy status to other drugs, medicaments and biological substances status: Secondary | ICD-10-CM

## 2022-11-16 DIAGNOSIS — M069 Rheumatoid arthritis, unspecified: Secondary | ICD-10-CM | POA: Diagnosis not present

## 2022-11-16 DIAGNOSIS — I25110 Atherosclerotic heart disease of native coronary artery with unstable angina pectoris: Principal | ICD-10-CM | POA: Diagnosis present

## 2022-11-16 DIAGNOSIS — I2510 Atherosclerotic heart disease of native coronary artery without angina pectoris: Secondary | ICD-10-CM | POA: Diagnosis not present

## 2022-11-16 DIAGNOSIS — Z981 Arthrodesis status: Secondary | ICD-10-CM

## 2022-11-16 DIAGNOSIS — I708 Atherosclerosis of other arteries: Secondary | ICD-10-CM | POA: Diagnosis present

## 2022-11-16 DIAGNOSIS — I10 Essential (primary) hypertension: Secondary | ICD-10-CM | POA: Diagnosis not present

## 2022-11-16 DIAGNOSIS — J84112 Idiopathic pulmonary fibrosis: Secondary | ICD-10-CM | POA: Diagnosis present

## 2022-11-16 DIAGNOSIS — Z8249 Family history of ischemic heart disease and other diseases of the circulatory system: Secondary | ICD-10-CM

## 2022-11-16 DIAGNOSIS — Z833 Family history of diabetes mellitus: Secondary | ICD-10-CM

## 2022-11-16 DIAGNOSIS — Z90710 Acquired absence of both cervix and uterus: Secondary | ICD-10-CM | POA: Diagnosis not present

## 2022-11-16 DIAGNOSIS — E669 Obesity, unspecified: Secondary | ICD-10-CM | POA: Insufficient documentation

## 2022-11-16 DIAGNOSIS — R079 Chest pain, unspecified: Secondary | ICD-10-CM | POA: Diagnosis not present

## 2022-11-16 DIAGNOSIS — M549 Dorsalgia, unspecified: Secondary | ICD-10-CM | POA: Diagnosis present

## 2022-11-16 DIAGNOSIS — Z83438 Family history of other disorder of lipoprotein metabolism and other lipidemia: Secondary | ICD-10-CM

## 2022-11-16 DIAGNOSIS — F410 Panic disorder [episodic paroxysmal anxiety] without agoraphobia: Secondary | ICD-10-CM | POA: Diagnosis present

## 2022-11-16 DIAGNOSIS — Z885 Allergy status to narcotic agent status: Secondary | ICD-10-CM

## 2022-11-16 LAB — BASIC METABOLIC PANEL
Anion gap: 11 (ref 5–15)
BUN: 18 mg/dL (ref 8–23)
CO2: 23 mmol/L (ref 22–32)
Calcium: 9.1 mg/dL (ref 8.9–10.3)
Chloride: 101 mmol/L (ref 98–111)
Creatinine, Ser: 1.09 mg/dL — ABNORMAL HIGH (ref 0.44–1.00)
GFR, Estimated: 54 mL/min — ABNORMAL LOW (ref >60)
Glucose, Bld: 111 mg/dL — ABNORMAL HIGH (ref 70–99)
Potassium: 4 mmol/L (ref 3.5–5.1)
Sodium: 135 mmol/L (ref 135–145)

## 2022-11-16 LAB — TROPONIN I (HIGH SENSITIVITY)
Troponin I (High Sensitivity): 132 ng/L (ref <18)
Troponin I (High Sensitivity): 129 ng/L (ref <18)

## 2022-11-16 LAB — CBC
HCT: 43.1 % (ref 36.0–46.0)
Hemoglobin: 14.6 g/dL (ref 12.0–15.0)
MCH: 32 pg (ref 26.0–34.0)
MCHC: 33.9 g/dL (ref 30.0–36.0)
MCV: 94.5 fL (ref 80.0–100.0)
Platelets: 156 10*3/uL (ref 150–400)
RBC: 4.56 MIL/uL (ref 3.87–5.11)
RDW: 13.1 % (ref 11.5–15.5)
WBC: 8 10*3/uL (ref 4.0–10.5)
nRBC: 0 % (ref 0.0–0.2)

## 2022-11-16 LAB — APTT: aPTT: 29 seconds (ref 24–36)

## 2022-11-16 LAB — PROTIME-INR
INR: 1 (ref 0.8–1.2)
Prothrombin Time: 13.2 seconds (ref 11.4–15.2)

## 2022-11-16 MED ORDER — AMLODIPINE BESYLATE 5 MG PO TABS
5.0000 mg | ORAL_TABLET | Freq: Every day | ORAL | Status: DC
  Administered 2022-11-17 – 2022-11-18 (×2): 5 mg via ORAL
  Filled 2022-11-16 (×2): qty 1

## 2022-11-16 MED ORDER — LEVOTHYROXINE SODIUM 50 MCG PO TABS: 25.0000 ug | ORAL_TABLET | Freq: Every day | ORAL | Status: DC

## 2022-11-16 MED ORDER — ASPIRIN 81 MG PO TBEC
81.0000 mg | DELAYED_RELEASE_TABLET | Freq: Every day | ORAL | Status: DC
  Administered 2022-11-17 – 2022-11-20 (×4): 81 mg via ORAL
  Filled 2022-11-16 (×5): qty 1

## 2022-11-16 MED ORDER — PANTOPRAZOLE SODIUM 40 MG PO TBEC
40.0000 mg | DELAYED_RELEASE_TABLET | Freq: Every day | ORAL | Status: DC
  Administered 2022-11-17 – 2022-11-20 (×4): 40 mg via ORAL
  Filled 2022-11-16 (×4): qty 1

## 2022-11-16 MED ORDER — ALPRAZOLAM 0.5 MG PO TABS
0.5000 mg | ORAL_TABLET | Freq: Three times a day (TID) | ORAL | Status: DC | PRN
  Administered 2022-11-17 – 2022-11-20 (×9): 0.5 mg via ORAL
  Filled 2022-11-16 (×9): qty 1

## 2022-11-16 MED ORDER — ONDANSETRON HCL 4 MG/2ML IJ SOLN
4.0000 mg | Freq: Four times a day (QID) | INTRAMUSCULAR | Status: DC | PRN
  Filled 2022-11-16: qty 2

## 2022-11-16 MED ORDER — FENTANYL CITRATE PF 50 MCG/ML IJ SOSY
25.0000 ug | PREFILLED_SYRINGE | INTRAMUSCULAR | Status: DC | PRN
  Administered 2022-11-16 – 2022-11-17 (×2): 25 ug via INTRAVENOUS
  Filled 2022-11-16 (×2): qty 1

## 2022-11-16 MED ORDER — ASPIRIN 325 MG PO TABS
325.0000 mg | ORAL_TABLET | Freq: Once | ORAL | Status: AC
  Administered 2022-11-16: 325 mg via ORAL
  Filled 2022-11-16: qty 1

## 2022-11-16 MED ORDER — OXYCODONE-ACETAMINOPHEN 5-325 MG PO TABS
1.0000 | ORAL_TABLET | Freq: Three times a day (TID) | ORAL | Status: DC | PRN
  Administered 2022-11-17 – 2022-11-20 (×10): 1 via ORAL
  Filled 2022-11-16 (×10): qty 1

## 2022-11-16 MED ORDER — SODIUM CHLORIDE 0.9% FLUSH: 3.0000 mL | INTRAVENOUS | Status: DC | PRN

## 2022-11-16 MED ORDER — ROSUVASTATIN CALCIUM 20 MG PO TABS
40.0000 mg | ORAL_TABLET | Freq: Every day | ORAL | Status: DC
  Administered 2022-11-17 – 2022-11-19 (×4): 40 mg via ORAL
  Filled 2022-11-16 (×4): qty 2

## 2022-11-16 MED ORDER — NALOXONE HCL 0.4 MG/ML IJ SOLN: 0.4000 mg | INTRAMUSCULAR | Status: DC | PRN

## 2022-11-16 MED ORDER — FENTANYL CITRATE PF 50 MCG/ML IJ SOSY
25.0000 ug | PREFILLED_SYRINGE | Freq: Once | INTRAMUSCULAR | Status: AC
  Administered 2022-11-16: 25 ug via INTRAVENOUS
  Filled 2022-11-16: qty 1

## 2022-11-16 MED ORDER — ALUM & MAG HYDROXIDE-SIMETH 200-200-20 MG/5ML PO SUSP: 30.0000 mL | Freq: Once | ORAL | Status: DC

## 2022-11-16 MED ORDER — METOPROLOL SUCCINATE ER 50 MG PO TB24: 50.0000 mg | ORAL_TABLET | Freq: Every day | ORAL | Status: DC

## 2022-11-16 MED ORDER — ONDANSETRON HCL 4 MG PO TABS: 4.0000 mg | ORAL_TABLET | Freq: Four times a day (QID) | ORAL | Status: DC | PRN

## 2022-11-16 MED ORDER — SODIUM CHLORIDE 0.9% FLUSH
3.0000 mL | Freq: Two times a day (BID) | INTRAVENOUS | Status: DC
  Administered 2022-11-17 – 2022-11-20 (×5): 3 mL via INTRAVENOUS

## 2022-11-16 MED ORDER — FAMOTIDINE IN NACL 20-0.9 MG/50ML-% IV SOLN
20.0000 mg | Freq: Once | INTRAVENOUS | Status: AC
  Administered 2022-11-16: 20 mg via INTRAVENOUS
  Filled 2022-11-16: qty 50

## 2022-11-16 MED ORDER — HEPARIN (PORCINE) 25000 UT/250ML-% IV SOLN
700.0000 [IU]/h | INTRAVENOUS | Status: DC
  Administered 2022-11-16 – 2022-11-17 (×2): 750 [IU]/h via INTRAVENOUS
  Filled 2022-11-16 (×2): qty 250

## 2022-11-16 MED ORDER — SODIUM CHLORIDE 0.9 % IV SOLN: 250.0000 mL | INTRAVENOUS | Status: DC | PRN

## 2022-11-16 MED ORDER — HEPARIN BOLUS VIA INFUSION
3000.0000 [IU] | Freq: Once | INTRAVENOUS | Status: AC
  Administered 2022-11-16: 3000 [IU] via INTRAVENOUS

## 2022-11-16 MED ORDER — LEVOTHYROXINE SODIUM 25 MCG PO TABS
25.0000 ug | ORAL_TABLET | Freq: Every day | ORAL | Status: DC
  Administered 2022-11-17 – 2022-11-20 (×4): 25 ug via ORAL
  Filled 2022-11-16 (×4): qty 1

## 2022-11-16 MED ORDER — ISOSORBIDE MONONITRATE ER 30 MG PO TB24
30.0000 mg | ORAL_TABLET | Freq: Every day | ORAL | Status: DC
  Administered 2022-11-17: 30 mg via ORAL
  Filled 2022-11-16: qty 1

## 2022-11-16 MED ORDER — NITROGLYCERIN 0.4 MG SL SUBL
0.4000 mg | SUBLINGUAL_TABLET | SUBLINGUAL | Status: DC | PRN
  Administered 2022-11-16: 0.4 mg via SUBLINGUAL
  Filled 2022-11-16: qty 1

## 2022-11-16 NOTE — ED Notes (Addendum)
Assumed care of pt, this RN went into introduce herself to pt. Pt states she wants to go home, RN explained the risks of leaving AMA and reasons why she needs to stay to be transferred to [facility]. Pt said she does not care and wants to leave and follow up with her cardiologist tomorrow. Dr. Thomes Dinning made aware

## 2022-11-16 NOTE — ED Triage Notes (Signed)
Pt via POV c/o chest pain and anxiety. Seen at PCP 5/3 and given rx for xanax with minimal relief. Pt has been SOB, diaphoretic, nauseated, and has had diarrhea. She reports severe anxiety today also. Pt ambulatory but nearly fell in hallway with what appeared to be altered LOC x several seconds.

## 2022-11-16 NOTE — Progress Notes (Signed)
ANTICOAGULATION CONSULT NOTE - Initial Consult  Pharmacy Consult:  Heparin Indication: chest pain/ACS  Allergies  Allergen Reactions   Celecoxib Itching and Other (See Comments)    Other Reaction(s): Not available   Citalopram Other (See Comments) and Palpitations    Heart rate increased   Morphine And Related Other (See Comments) and Itching    Patient states she is not allergic to morphine; states had morphine with no reaction   Ciprofloxacin Hcl Other (See Comments)    Mya have made her sick or bad diarrhea   Dicyclomine Diarrhea and Other (See Comments)    Made diarrhea worse   Lamotrigine Other (See Comments) and Diarrhea    Unknown   Metoclopramide Other (See Comments)    Dizziness   Milnacipran Hcl Other (See Comments)    Unknown   Omeprazole Other (See Comments)   Pregabalin Other (See Comments)   Serotonin Reuptake Inhibitors (Ssris) Other (See Comments)   Sertraline Hcl Other (See Comments)    Stomach upset   Simvastatin Other (See Comments)    Increased back and muscle pain   Tizanidine Other (See Comments)    unknown   Trazodone And Nefazodone Other (See Comments)    hallucinations   Venlafaxine Other (See Comments)    Affected eyes, blurred vision   Codeine Itching   Metoclopramide Hcl Other (See Comments)    Dizziness    Morphine Other (See Comments)   Nefazodone Other (See Comments)   Sertraline Other (See Comments)   Tape Other (See Comments)    Skin gets red and skin pulls off, paper tape only   Sertraline Hcl Other (See Comments)    Stomach upset    Patient Measurements: Height: 4\' 11"  (149.9 cm) Weight: 68 kg (150 lb) IBW/kg (Calculated) : 43.2 Heparin Dosing Weight: 60 kg  Vital Signs: Temp: 98 F (36.7 C) (05/05 1644) Temp Source: Oral (05/05 1644) BP: 144/62 (05/05 1644) Pulse Rate: 84 (05/05 1644)  Labs: Recent Labs    11/16/22 1645  HGB 14.6  HCT 43.1  PLT 156  CREATININE 1.09*  TROPONINIHS 132*    Estimated Creatinine  Clearance: 38.5 mL/min (A) (by C-G formula based on SCr of 1.09 mg/dL (H)).   Medical History: Past Medical History:  Diagnosis Date   Abnormal chest CT    Anxiety    Arthritis    Rhumatoid and Osteoarthritis   Carotid artery disease (HCC)    Dr. Myra Gianotti, s/p left CEA, severe R ICA stenosis   Coronary atherosclerosis of native coronary artery    DES RCA/circumflex 4/10, LVEF 65 -> repeat cath 11/2021 with multivessel CAD including LM stenosis, initial med rx   Depression    Essential hypertension    Fibromyalgia    GERD (gastroesophageal reflux disease)    Hiatal hernia    Hyperlipidemia    Kidney cysts    NSTEMI (non-ST elevated myocardial infarction) (HCC)    4/10   Renal artery stenosis (HCC)    Left renal artery stent 12/13   Restless leg syndrome    Spondylosis without myelopathy    Tobacco abuse     Assessment: 21 YOF presented with chest pain and anxiety.  Troponin elevated at 132.  Pharmacy consulted to dose IV heparin for ACS.  CBC WNL; no bleeding reported.  Goal of Therapy:  Heparin level 0.3-0.7 units/ml Monitor platelets by anticoagulation protocol: Yes   Plan:  Heparin 3000 units IV bolus, then Heparin gtt at 750 units/hr Check 8 hr heparin level Daily  heparin level and CBC  Jamelyn Bovard D. Laney Potash, PharmD, BCPS, BCCCP 11/16/2022, 6:29 PM

## 2022-11-16 NOTE — ED Notes (Signed)
MD at bedside to evaluate patient.

## 2022-11-16 NOTE — ED Notes (Signed)
Date and time results received: 11/16/22 1730 (use smartphrase ".now" to insert current time)  Test: Troponin   Critical Value: 132  Name of Provider Notified: Dr Jearld Fenton  Orders Received? Or Actions Taken?:

## 2022-11-16 NOTE — H&P (Signed)
TRH H&P    Patient Demographics:    [name], is a 72 y.o. female  MRN: [id number]  DOB - 03-03-1951  Admit Date - 11/16/2022  Referring MD/NP/PA: Dr Jearld Fenton  Outpatient Primary MD for the patient is Ignatius Specking, MD  Patient coming from: Home  Chief complaint-Chest pain   HPI:    [name]  is a 72 y.o. female, with history of hypertension, carotid artery stenosis, severe left main/multivessel CAD per last cath in May 2023, not a surgical candidate per cardiology.  She has extensive aortic calcification extending into great vessels and an off-pump LIMA to LAD, not able to be performed due to subclavian stenosis.  Interventional team felt PCI would be very high risk so medical therapy was recommended. Patient came to hospital with ongoing chest pain for past 1 week.  Patient says that she felt anxious and had panic attack and went to PCP and was prescribed Xanax, 7 tablets.  Patient says that she felt better after taking Xanax for short while and then she would suddenly get chest pain.  No correlation of chest pain with exertion.  Pain would just come while watching TV.  She did take nitroglycerin to relieve her symptoms. Patient also has been having diarrhea. Denies vomiting but complains of nausea. Denies abdominal pain or dysuria. In the ED troponin was elevated at 132, 129. Cardiology fellow Dr. Orson Aloe was consulted by ED provider.  He recommended heparin drip and transferred to [facility].    Review of systems:    In addition to the HPI above,    All other systems reviewed and are negative.    Past History of the following :    Past Medical History:  Diagnosis Date   Abnormal chest CT    Anxiety    Arthritis    Rhumatoid and Osteoarthritis   Carotid artery disease (HCC)    Dr. Myra Gianotti, s/p left CEA, severe R ICA stenosis   Coronary atherosclerosis of native coronary artery    DES  RCA/circumflex 4/10, LVEF 65 -> repeat cath 11/2021 with multivessel CAD including LM stenosis, initial med rx   Depression    Essential hypertension    Fibromyalgia    GERD (gastroesophageal reflux disease)    Hiatal hernia    Hyperlipidemia    Kidney cysts    NSTEMI (non-ST elevated myocardial infarction) (HCC)    4/10   Renal artery stenosis (HCC)    Left renal artery stent 12/13   Restless leg syndrome    Spondylosis without myelopathy    Tobacco abuse       Past Surgical History:  Procedure Laterality Date   ABDOMINAL HYSTERECTOMY  1977   BREAST LUMPECTOMY     CAROTID ENDARTERECTOMY Left 02/28/2009   CHOLECYSTECTOMY  1991   COLONOSCOPY     COLONOSCOPY N/A 05/08/2017   Procedure: COLONOSCOPY;  Surgeon: Malissa Hippo, MD;  [facility];  Service: Endoscopy;  Laterality: N/A;  12:00   ESOPHAGOGASTRODUODENOSCOPY N/A 04/07/2013   Procedure: ESOPHAGOGASTRODUODENOSCOPY (EGD);  Surgeon: Ok Anis  Cline Crock, MD;  Location: [facility];  Service: Endoscopy;  Laterality: N/A;  250   ESOPHAGOGASTRODUODENOSCOPY N/A 02/02/2014   Procedure: ESOPHAGOGASTRODUODENOSCOPY (EGD);  Surgeon: Malissa Hippo, MD;  [facility]: [facility];  Service: Endoscopy;  Laterality: N/A;  120   EYE SURGERY     Catartact bilateral   JOINT REPLACEMENT  March 2013   Left knee   JOINT REPLACEMENT  2006   Left Knee   kidney stent     Left December 2013   LEFT HEART CATH AND CORONARY ANGIOGRAPHY N/A 11/13/2021   Procedure: LEFT HEART CATH AND CORONARY ANGIOGRAPHY;  Surgeon: Iran Ouch, MD;  [facility]: [facility];  Service: Cardiovascular;  Laterality: N/A;   LUMBAR DISC SURGERY     L5-S1   MULTIPLE TOOTH EXTRACTIONS  Jan. 2015   Neck Fusion  1996   C4 to C6   PERCUTANEOUS STENT INTERVENTION Left 06/15/2012   Procedure: PERCUTANEOUS STENT INTERVENTION;  Surgeon: Nada Libman, MD;  Location: [facility] CATH LAB;  Service: Cardiovascular;  Laterality: Left;  renal   PERIPHERAL VASCULAR BALLOON  ANGIOPLASTY  05/26/2017   Procedure: PERIPHERAL VASCULAR BALLOON ANGIOPLASTY;  Surgeon: Nada Libman, MD;  Location: [facility];  Service: Cardiovascular;;  Lt. Renal Angioplasty   POLYPECTOMY  05/08/2017   Procedure: POLYPECTOMY;  Surgeon: Malissa Hippo, MD;  Location: [facility];  Service: Endoscopy;;  colon    RENAL ANGIOGRAM N/A 06/15/2012   Procedure: RENAL ANGIOGRAM;  Surgeon: Nada Libman, MD;  Location: [facility] CATH LAB;  Service: Cardiovascular;  Laterality: N/A;   RENAL ANGIOGRAPHY  05/26/2017   Procedure: RENAL ANGIOGRAPHY;  Surgeon: Nada Libman, MD;  Location: [facility];  Service: Cardiovascular;;   SPINE SURGERY  1994   Ruptured disc   TONSILECTOMY, ADENOIDECTOMY, BILATERAL MYRINGOTOMY AND TUBES  1969   TOTAL KNEE ARTHROPLASTY     Left   TUBAL LIGATION        Social History:      Social History   Tobacco Use   Smoking status: Former    Years: 45    Types: Cigarettes    Quit date: 02/18/2022    Years since quitting: 0.7   Smokeless tobacco: Never  Substance Use Topics   Alcohol use: No    Alcohol/week: 0.0 standard drinks of alcohol       Family History :     Family History  Problem Relation Age of Onset   Other Mother        Cerebral hemorrage   Heart disease Mother    Heart attack Mother    Hyperlipidemia Mother    Heart disease Father    Heart attack Father    Hyperlipidemia Father    Heart disease Brother        Heart Disease before age 70   Diabetes Brother    Hypertension Brother    Heart attack Brother    Cancer Sister    Arthritis Sister    Heart disease Sister    Heart disease Daughter 34       Heart Disease before age 48   Heart attack Daughter    Hypertension Daughter    Cancer Sister    Arthritis Sister    Diabetes Brother        Varicose  Veins   Peripheral vascular disease Brother    Diabetes Brother       Home Medications:   Prior to Admission medications  Medication Sig Start Date End  Date Taking? Authorizing Provider  albuterol (VENTOLIN HFA) 108 (90 Base) MCG/ACT inhaler 1 puff 4 (four) times daily as needed for wheezing or shortness of breath. 04/22/21  Yes [provider]  ALPRAZolam Prudy Feeler) 0.5 MG tablet Take 0.5 mg by mouth 3 (three) times daily as needed. 11/14/22  Yes [provider]  amLODipine (NORVASC) 5 MG tablet TAKE 1 TABLET BY MOUTH DAILY. PLEASE CALL [phone] TO SCHEDULE AN APPOINTMENT FOR FUTURE REFILLS Patient taking differently: Take 5 mg by mouth daily. 10/17/22  Yes Jonelle Sidle, MD  aspirin 81 MG EC tablet Take 81 mg by mouth daily.   Yes [provider]  CALCIUM PO Take 1-2 tablets by mouth daily. Gummy   Yes [provider]  diclofenac Sodium (VOLTAREN) 1 % GEL Apply 2 g topically 3 (three) times daily.   Yes [provider]  DULoxetine (CYMBALTA) 20 MG capsule Take 20 mg by mouth at bedtime. 05/28/21  Yes [provider]  EPINEPHrine (EPIPEN JR) 0.15 MG/0.3ML injection Inject 0.15 mg into the muscle as needed for anaphylaxis.   Yes [provider]  isosorbide mononitrate (IMDUR) 30 MG 24 hr tablet TAKE ONE TABLET BY MOUTH ONCE DAILY 09/30/22  Yes Dunn, Tacey Ruiz, PA-C  levothyroxine (SYNTHROID) 25 MCG tablet Take 25 mcg by mouth daily before breakfast. 02/16/20  Yes [provider]  metoprolol succinate (TOPROL-XL) 50 MG 24 hr tablet Take 1 tablet (50 mg total) by mouth daily. Please call [phone] to schedule an appointment for future refills. Thank you. 07/29/22  Yes Jonelle Sidle, MD  Multiple Vitamins-Minerals (ALIVE WOMENS GUMMY PO) Take 1 tablet by mouth daily. gummy   Yes [provider]  nitroGLYCERIN (NITROSTAT) 0.4 MG SL tablet Place 1 tablet (0.4 mg total) under the tongue every 5 (five) minutes as needed for chest pain. 04/07/22  Yes Nada Libman, MD  Omega-3 1000 MG CAPS Take 1,000 mg by mouth daily.   Yes [provider]   oxyCODONE-acetaminophen (PERCOCET/ROXICET) 5-325 MG tablet Take 1 tablet by mouth 3 (three) times daily as needed for moderate pain or severe pain. 06/05/21  Yes [provider]  pantoprazole (PROTONIX) 40 MG tablet Take 40 mg by mouth daily. 10/08/22  Yes [provider]  rosuvastatin (CRESTOR) 40 MG tablet Take 1 tablet (40 mg total) by mouth at bedtime. Please call [phone] to schedule an appointment for future refills. Thank you. 06/27/22  Yes Jonelle Sidle, MD  sertraline (ZOLOFT) 50 MG tablet Take 50 mg by mouth daily. 05/28/21  Yes [provider]  Turmeric 500 MG CAPS Take 500 mg by mouth daily.   Yes [provider]  vitamin B-12 (CYANOCOBALAMIN) 1000 MCG tablet Take 2 tablets (2,000 mcg total) by mouth daily. Patient taking differently: Take 1,000 mcg by mouth daily. 03/13/20  Yes Glade Lloyd, MD  Vitamin D, Ergocalciferol, (DRISDOL) 50000 UNITS CAPS Take 50,000 Units by mouth every Monday.    Yes [provider]  zinc gluconate 50 MG tablet Take 50 mg by mouth daily.   Yes [provider]  BELSOMRA 10 MG TABS Take 1 tablet by mouth at bedtime as needed. Patient not taking: Reported on 11/16/2022 07/24/22   [provider]  diazepam (VALIUM) 5 MG tablet Take 5 mg by mouth 2 (two) times daily as needed for anxiety. Patient not taking: Reported on 11/16/2022 05/04/21   [provider]  ondansetron (ZOFRAN) 4 MG tablet Take 4  mg by mouth 3 (three) times daily as needed for nausea. 06/03/21   [provider]     Allergies:     Allergies  Allergen Reactions   Celecoxib Itching and Other (See Comments)    Other Reaction(s): Not available   Citalopram Other (See Comments) and Palpitations    Heart rate increased   Morphine And Related Other (See Comments) and Itching    Patient states she is not allergic to morphine; states had morphine with no reaction   Ciprofloxacin Hcl Other (See Comments)     Mya have made her sick or bad diarrhea   Dicyclomine Diarrhea and Other (See Comments)    Made diarrhea worse   Lamotrigine Other (See Comments) and Diarrhea    Unknown   Metoclopramide Other (See Comments)    Dizziness   Milnacipran Hcl Other (See Comments)    Unknown   Omeprazole Other (See Comments)   Pregabalin Other (See Comments)   Serotonin Reuptake Inhibitors (Ssris) Other (See Comments)   Sertraline Hcl Other (See Comments)    Stomach upset   Simvastatin Other (See Comments)    Increased back and muscle pain   Tizanidine Other (See Comments)    unknown   Trazodone And Nefazodone Other (See Comments)    hallucinations   Venlafaxine Other (See Comments)    Affected eyes, blurred vision   Codeine Itching   Metoclopramide Hcl Other (See Comments)    Dizziness    Morphine Other (See Comments)   Nefazodone Other (See Comments)   Sertraline Other (See Comments)   Tape Other (See Comments)    Skin gets red and skin pulls off, paper tape only   Sertraline Hcl Other (See Comments)    Stomach upset     Physical Exam:   Vitals  Blood pressure (!) 175/59, pulse 74, temperature 98.2 F (36.8 C), temperature source Oral, resp. rate 11, height 4\' 11"  (1.499 m), weight 68 kg, SpO2 98 %.  1.  General: Appears in no acute distress  2. Psychiatric: Alert, oriented x 3, intact insight and judgment  3. Neurologic: Cranial nerves II through XII grossly intact, no focal deficit noted  4. HEENMT:  Atraumatic normocephalic, extraocular muscles are intact  5. Respiratory : Lungs clear to auscultation bilaterally  6. Cardiovascular : S1-S2, regular, no murmur auscultated  7. Gastrointestinal:  Abdomen is soft, nontender, no organomegaly     Data Review:    CBC Recent Labs  Lab 11/16/22 1645  WBC 8.0  HGB 14.6  HCT 43.1  PLT 156  MCV 94.5  MCH 32.0  MCHC 33.9  RDW 13.1    ------------------------------------------------------------------------------------------------------------------  Results for orders placed or performed during the hospital encounter of 11/16/22 (from the past 48 hour(s))  Basic metabolic panel     Status: Abnormal   Collection Time: 11/16/22  4:45 PM  Result Value Ref Range   Sodium 135 135 - 145 mmol/L   Potassium 4.0 3.5 - 5.1 mmol/L   Chloride 101 98 - 111 mmol/L   CO2 23 22 - 32 mmol/L   Glucose, Bld 111 (H) 70 - 99 mg/dL    Comment: Glucose reference range applies only to samples taken after fasting for at least 8 hours.   BUN 18 8 - 23 mg/dL   Creatinine, Ser 5.40 (H) 0.44 - 1.00 mg/dL   Calcium 9.1 8.9 - 98.1 mg/dL   GFR, Estimated 54 (L) >60 mL/min    Comment: (NOTE) Calculated using the CKD-EPI Creatinine Equation (  2021)    Anion gap 11 5 - 15    Comment: Performed at [facility], [address], [place], [place] [address]  CBC     Status: None   Collection Time: 11/16/22  4:45 PM  Result Value Ref Range   WBC 8.0 4.0 - 10.5 K/uL   RBC 4.56 3.87 - 5.11 MIL/uL   Hemoglobin 14.6 12.0 - 15.0 g/dL   HCT 60.4 54.0 - 98.1 %   MCV 94.5 80.0 - 100.0 fL   MCH 32.0 26.0 - 34.0 pg   MCHC 33.9 30.0 - 36.0 g/dL   RDW 19.1 47.8 - 29.5 %   Platelets 156 150 - 400 K/uL   nRBC 0.0 0.0 - 0.2 %    Comment: Performed at [facility], [address], [place], [place] [address]  Troponin I (High Sensitivity)     Status: Abnormal   Collection Time: 11/16/22  4:45 PM  Result Value Ref Range   Troponin I (High Sensitivity) 132 (HH) <18 ng/L    Comment: CRITICAL RESULT CALLED TO, READ BACK BY AND VERIFIED WITH KING C @ 1730 ON [id number] BY HENDERSON L (NOTE) Elevated high sensitivity troponin I (hsTnI) values and significant  changes across serial measurements may suggest ACS but many other  chronic and acute conditions are known to elevate hsTnI results.  Refer to the "Links" section for chest pain algorithms and additional   guidance. Performed at [facility], [address] [place], [place] [address]   Protime-INR     Status: None   Collection Time: 11/16/22  6:13 PM  Result Value Ref Range   Prothrombin Time 13.2 11.4 - 15.2 seconds   INR 1.0 0.8 - 1.2    Comment: (NOTE) INR goal varies based on device and disease states. Performed at [facility], [address] [place], [place] [address]   APTT     Status: None   Collection Time: 11/16/22  6:13 PM  Result Value Ref Range   aPTT 29 24 - 36 seconds    Comment: Performed at [facility], [address] [place], [place] [address]  Troponin I (High Sensitivity)     Status: Abnormal   Collection Time: 11/16/22  6:41 PM  Result Value Ref Range   Troponin I (High Sensitivity) 129 (HH) <18 ng/L    Comment: CRITICAL VALUE NOTED. VALUE IS CONSISTENT WITH PREVIOUSLY CRITICAL REPORTED/CALLED VALUE (NOTE) Elevated high sensitivity troponin I (hsTnI) values and significant  changes across serial measurements may suggest ACS but many other  chronic and acute conditions are known to elevate hsTnI results.  Refer to the "Links" section for chest pain algorithms and additional  guidance. Performed at [facility], [address] [place], [place] [address]     Chemistries  Recent Labs  Lab 11/16/22 1645  NA 135  K 4.0  CL 101  CO2 23  GLUCOSE 111*  BUN 18  CREATININE 1.09*  CALCIUM 9.1   ------------------------------------------------------------------------------------------------------------------  ------------------------------------------------------------------------------------------------------------------ GFR: Estimated Creatinine Clearance: 38.5 mL/min (A) (by C-G formula based on SCr of 1.09 mg/dL (H)). Liver Function Tests: No results for input(s): "AST", "ALT", "ALKPHOS", "BILITOT", "PROT", "ALBUMIN" in the last 168 hours. No results for input(s): "LIPASE", "AMYLASE" in the last 168 hours. No results for input(s): "AMMONIA" in the  last 168 hours. Coagulation Profile: Recent Labs  Lab 11/16/22 1813  INR 1.0   Cardiac Enzymes: No results for input(s): "CKTOTAL", "CKMB", "CKMBINDEX", "TROPONINI" in the last 168 hours. BNP (last 3 results)  No results for input(s): "PROBNP" in the last 8760 hours. HbA1C: No results for input(s): "HGBA1C" in the last 72 hours. CBG: No results for input(s): "GLUCAP" in the last 168 hours. Lipid Profile: No results for input(s): "CHOL", "HDL", "LDLCALC", "TRIG", "CHOLHDL", "LDLDIRECT" in the last 72 hours. Thyroid Function Tests: No results for input(s): "TSH", "T4TOTAL", "FREET4", "T3FREE", "THYROIDAB" in the last 72 hours. Anemia Panel: No results for input(s): "VITAMINB12", "FOLATE", "FERRITIN", "TIBC", "IRON", "RETICCTPCT" in the last 72 hours.  --------------------------------------------------------------------------------------------------------------- Urine analysis:    Component Value Date/Time   COLORURINE YELLOW 06/01/2021 1710   APPEARANCEUR Clear 10/14/2021 1419   LABSPEC 1.011 06/01/2021 1710   PHURINE 6.0 06/01/2021 1710   GLUCOSEU Negative 10/14/2021 1419   HGBUR NEGATIVE 06/01/2021 1710   BILIRUBINUR Negative 10/14/2021 1419   KETONESUR NEGATIVE 06/01/2021 1710   PROTEINUR Negative 10/14/2021 1419   PROTEINUR 30 (A) 06/01/2021 1710   UROBILINOGEN 0.2 02/06/2014 1816   NITRITE Negative 10/14/2021 1419   NITRITE NEGATIVE 06/01/2021 1710   LEUKOCYTESUR Trace (A) 10/14/2021 1419   LEUKOCYTESUR LARGE (A) 06/01/2021 1710      Imaging Results:    DG Chest Port 1 View  Result Date: 11/16/2022 CLINICAL DATA:  Chest pain. EXAM: PORTABLE CHEST 1 VIEW COMPARISON:  CT chest dated January 13, 2022 FINDINGS: The heart is enlarged on this AP projection. Prominent aortic atherosclerotic calcifications. Lungs are clear without evidence of focal consolidation or pleural effusion. Partially imaged anterior cervical discectomy and fusion hardware. No acute osseous abnormality.  IMPRESSION: Cardiomegaly. No acute cardiopulmonary process. Electronically Signed   By: Larose Hires D.O.   On: 11/16/2022 17:18    My personal review of EKG: Rhythm NSR, diffuse T wave abnormalities noted with T wave inversions in leads V4, V5, V6   Assessment & Plan:    Principal Problem:   NSTEMI (non-ST elevated myocardial infarction) (HCC)   NSTEMI-patient presented with chest pain, mild elevation of troponin 132, 129.  Has significant history of CAD.  Not a candidate for PCI.  Started on heparin as per cardiology recommendation.  Also received aspirin 325 mg p.o. x 1 in the ED.  Will continue with heparin, aspirin 81 mg p.o. daily, statin.  Cardiology will see patient once patient arrives at [facility]. Anxiety-continue Xanax 0.5 mg 3 times daily as needed Severe CAD-not a candidate for PCI as per cardiology.  Continue statin, aspirin.  Continue Imdur, metoprolol GERD-patient has been feeling bloated for past 3 days.  Will give 1 dose of Maalox/Mylanta, Pepcid 20 mg IV x 1.  Continue Protonix 40 mg p.o. daily from tomorrow morning. Hypertension-blood pressure stable, continue Norvasc, metoprolol    DVT Prophylaxis-   heparin  AM Labs Ordered, also please review Full Orders  Family Communication: Admission, patients condition and plan of care including tests being ordered have been discussed with the patient  who indicate understanding and agree with the plan and Code Status.  Code Status: Full code  Admission status: Inpatient :The appropriate admission status for this patient is INPATIENT. Inpatient status is judged to be reasonable and necessary in order to provide the required intensity of service to ensure the patient's safety. The patient's presenting symptoms, physical exam findings, and initial radiographic and laboratory data in the context of their chronic comorbidities is felt to place them at high risk for further clinical deterioration. Furthermore, it is not  anticipated that the patient will be medically stable for discharge from the hospital within 2 midnights of admission. The  following factors support the admission status of inpatient.      * I certify that at the point of admission it is my clinical judgment that the patient will require inpatient hospital care spanning beyond 2 midnights from the point of admission due to high intensity of service, high risk for further deterioration and high frequency of surveillance required.*  Time spent in minutes : 60 minutes   Manly Nestle S Tremel Setters M.D

## 2022-11-16 NOTE — ED Notes (Signed)
Dr Adefeso at bedside. 

## 2022-11-16 NOTE — ED Provider Notes (Signed)
Patient presents with CP to ED w/ intermittent diaphoresis/nausea.   Per chart review of cards note from 09/04/22: [name] is a 72 y.o. female last seen in July 2023.  She is here for a routine visit.  Fortunately, reports no obvious angina, denies any nitroglycerin use.  She has been having a lot of trouble with lower back pain and undergoing pain control injections, sees Dr. Laurian Brim.   She has complex cardiac history with severe left main/multivessel CAD. Patient was evaluated by Dr. Laneta Simmers in May of last year and not felt to be a surgical candidate due to extensive aortic calcification extending into the great vessels, also off-pump LIMA to LAD not an option due to subclavian stenosis. The interventional team felt that PCI would be very high risk and therefore medical therapy was ultimately recommended.   Initial trop 132.EKG without signs of ischemia. Pt w/ NSTEMI. Given ASA, nitroglycerin, has mild HA but is CP-free. D/w cards, will give heparin and admit.    CRITICAL CARE Performed by: Loetta Rough Total critical care time: 30 minutes Critical care time was exclusive of separately billable procedures and treating other patients. Critical care was necessary to treat or prevent imminent or life-threatening deterioration. Critical care was time spent personally by me on the following activities: development of treatment plan with patient and/or surrogate as well as nursing, discussions with consultants, evaluation of patient's response to treatment, examination of patient, obtaining history from patient or surrogate, ordering and performing treatments and interventions, ordering and review of laboratory studies, ordering and review of radiographic studies, pulse oximetry and re-evaluation of patient's condition.    Loetta Rough, MD 12/04/22 [phone]
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
## 2022-11-16 NOTE — ED Notes (Signed)
Pt to bathroom via wheelchair with RN and NT.

## 2022-11-16 NOTE — ED Notes (Signed)
Pt to bathroom with NT via wheelchair.

## 2022-11-17 ENCOUNTER — Inpatient Hospital Stay (HOSPITAL_COMMUNITY): Payer: 59

## 2022-11-17 DIAGNOSIS — E7849 Other hyperlipidemia: Secondary | ICD-10-CM | POA: Diagnosis not present

## 2022-11-17 DIAGNOSIS — M797 Fibromyalgia: Secondary | ICD-10-CM

## 2022-11-17 DIAGNOSIS — E039 Hypothyroidism, unspecified: Secondary | ICD-10-CM | POA: Insufficient documentation

## 2022-11-17 DIAGNOSIS — I200 Unstable angina: Secondary | ICD-10-CM | POA: Diagnosis not present

## 2022-11-17 DIAGNOSIS — I2510 Atherosclerotic heart disease of native coronary artery without angina pectoris: Secondary | ICD-10-CM

## 2022-11-17 DIAGNOSIS — R079 Chest pain, unspecified: Secondary | ICD-10-CM

## 2022-11-17 DIAGNOSIS — M069 Rheumatoid arthritis, unspecified: Secondary | ICD-10-CM | POA: Insufficient documentation

## 2022-11-17 DIAGNOSIS — I10 Essential (primary) hypertension: Secondary | ICD-10-CM

## 2022-11-17 DIAGNOSIS — I739 Peripheral vascular disease, unspecified: Secondary | ICD-10-CM

## 2022-11-17 DIAGNOSIS — J84112 Idiopathic pulmonary fibrosis: Secondary | ICD-10-CM

## 2022-11-17 LAB — COMPREHENSIVE METABOLIC PANEL
ALT: 13 U/L (ref 0–44)
AST: 17 U/L (ref 15–41)
Albumin: 3.5 g/dL (ref 3.5–5.0)
Alkaline Phosphatase: 64 U/L (ref 38–126)
Anion gap: 8 (ref 5–15)
BUN: 15 mg/dL (ref 8–23)
CO2: 25 mmol/L (ref 22–32)
Calcium: 8.6 mg/dL — ABNORMAL LOW (ref 8.9–10.3)
Chloride: 102 mmol/L (ref 98–111)
Creatinine, Ser: 1.11 mg/dL — ABNORMAL HIGH (ref 0.44–1.00)
GFR, Estimated: 52 mL/min — ABNORMAL LOW (ref >60)
Glucose, Bld: 102 mg/dL — ABNORMAL HIGH (ref 70–99)
Potassium: 3.3 mmol/L — ABNORMAL LOW (ref 3.5–5.1)
Sodium: 135 mmol/L (ref 135–145)
Total Bilirubin: 0.5 mg/dL (ref 0.3–1.2)
Total Protein: 6.3 g/dL — ABNORMAL LOW (ref 6.5–8.1)

## 2022-11-17 LAB — ECHOCARDIOGRAM COMPLETE
AR max vel: 1.69 cm2
AV Peak grad: 5.8 mmHg
Ao pk vel: 1.2 m/s
Area-P 1/2: 2.95 cm2
Height: 59 in
S' Lateral: 2.7 cm
Single Plane A4C EF: 57.9 %
Weight: 2400 oz

## 2022-11-17 LAB — CBC
HCT: 38.9 % (ref 36.0–46.0)
Hemoglobin: 12.8 g/dL (ref 12.0–15.0)
MCH: 31.4 pg (ref 26.0–34.0)
MCHC: 32.9 g/dL (ref 30.0–36.0)
MCV: 95.6 fL (ref 80.0–100.0)
Platelets: 145 10*3/uL — ABNORMAL LOW (ref 150–400)
RBC: 4.07 MIL/uL (ref 3.87–5.11)
RDW: 13.2 % (ref 11.5–15.5)
WBC: 6.9 10*3/uL (ref 4.0–10.5)
nRBC: 0 % (ref 0.0–0.2)

## 2022-11-17 LAB — HEPARIN LEVEL (UNFRACTIONATED)
Heparin Unfractionated: 0.39 IU/mL (ref 0.30–0.70)
Heparin Unfractionated: 0.38 IU/mL (ref 0.30–0.70)

## 2022-11-17 MED ORDER — SODIUM CHLORIDE 0.9 % WEIGHT BASED INFUSION: 1.0000 mL/kg/h | INTRAVENOUS | Status: DC

## 2022-11-17 MED ORDER — ISOSORBIDE MONONITRATE ER 60 MG PO TB24
60.0000 mg | ORAL_TABLET | Freq: Every day | ORAL | Status: DC
  Administered 2022-11-18 – 2022-11-20 (×3): 60 mg via ORAL
  Filled 2022-11-17 (×3): qty 1

## 2022-11-17 MED ORDER — DULOXETINE HCL 20 MG PO CPEP
20.0000 mg | ORAL_CAPSULE | Freq: Every day | ORAL | Status: DC
  Administered 2022-11-17 – 2022-11-19 (×3): 20 mg via ORAL
  Filled 2022-11-17 (×4): qty 1

## 2022-11-17 MED ORDER — SERTRALINE HCL 50 MG PO TABS
50.0000 mg | ORAL_TABLET | Freq: Every day | ORAL | Status: DC
  Administered 2022-11-17 – 2022-11-20 (×4): 50 mg via ORAL
  Filled 2022-11-17 (×4): qty 1

## 2022-11-17 MED ORDER — METOPROLOL SUCCINATE ER 100 MG PO TB24
100.0000 mg | ORAL_TABLET | Freq: Every day | ORAL | Status: DC
  Administered 2022-11-17 – 2022-11-20 (×4): 100 mg via ORAL
  Filled 2022-11-17 (×4): qty 1

## 2022-11-17 MED ORDER — SODIUM CHLORIDE 0.9 % WEIGHT BASED INFUSION
3.0000 mL/kg/h | INTRAVENOUS | Status: DC
  Administered 2022-11-18: 3 mL/kg/h via INTRAVENOUS

## 2022-11-17 MED ORDER — POTASSIUM CHLORIDE CRYS ER 20 MEQ PO TBCR
40.0000 meq | EXTENDED_RELEASE_TABLET | Freq: Once | ORAL | Status: AC
  Administered 2022-11-17: 40 meq via ORAL
  Filled 2022-11-17: qty 2

## 2022-11-17 NOTE — Progress Notes (Signed)
ANTICOAGULATION CONSULT NOTE   Pharmacy Consult:  Heparin Indication: chest pain/ACS  Allergies  Allergen Reactions   Celecoxib Itching and Other (See Comments)    Other Reaction(s): Not available   Citalopram Other (See Comments) and Palpitations    Heart rate increased   Morphine And Related Other (See Comments) and Itching    Patient states she is not allergic to morphine; states had morphine with no reaction   Ciprofloxacin Hcl Other (See Comments)    Mya have made her sick or bad diarrhea   Dicyclomine Diarrhea and Other (See Comments)    Made diarrhea worse   Lamotrigine Other (See Comments) and Diarrhea    Unknown   Metoclopramide Other (See Comments)    Dizziness   Milnacipran Hcl Other (See Comments)    Unknown   Omeprazole Other (See Comments)   Pregabalin Other (See Comments)   Serotonin Reuptake Inhibitors (Ssris) Other (See Comments)   Simvastatin Other (See Comments)    Increased back and muscle pain   Tizanidine Other (See Comments)    unknown   Trazodone And Nefazodone Other (See Comments)    hallucinations   Venlafaxine Other (See Comments)    Affected eyes, blurred vision   Codeine Itching   Metoclopramide Hcl Other (See Comments)    Dizziness    Morphine Other (See Comments)   Nefazodone Other (See Comments)   Sertraline Other (See Comments)   Tape Other (See Comments)    Skin gets red and skin pulls off, paper tape only   Sertraline Hcl Other (See Comments)    Stomach upset   Sertraline Hcl Other (See Comments)    Stomach upset. Pt takes this med at home 11/2022    Patient Measurements: Height: 4\' 11"  (149.9 cm) Weight: 68 kg (150 lb) IBW/kg (Calculated) : 43.2 Heparin Dosing Weight: 60 kg  Vital Signs: Temp: 97.9 F (36.6 C) (05/06 0839) Temp Source: Oral (05/06 0839) BP: 141/57 (05/06 0839) Pulse Rate: 70 (05/06 0839)  Labs: Recent Labs    11/16/22 1645 11/16/22 1813 11/16/22 1841 11/17/22 0223 11/17/22 0955  HGB 14.6  --   --   12.8  --   HCT 43.1  --   --  38.9  --   PLT 156  --   --  145*  --   APTT  --  29  --   --   --   LABPROT  --  13.2  --   --   --   INR  --  1.0  --   --   --   HEPARINUNFRC  --   --   --  0.39 0.38  CREATININE 1.09*  --   --  1.11*  --   TROPONINIHS 132*  --  129*  --   --      Estimated Creatinine Clearance: 37.8 mL/min (A) (by C-G formula based on SCr of 1.11 mg/dL (H)).   Medical History: Past Medical History:  Diagnosis Date   Abnormal chest CT    Anxiety    Arthritis    Rhumatoid and Osteoarthritis   Carotid artery disease (HCC)    Dr. Myra [name], s/p left CEA, severe R ICA stenosis   Coronary atherosclerosis of native coronary artery    DES RCA/circumflex 4/10, LVEF 65 -> repeat cath 11/2021 with multivessel CAD including LM stenosis, initial med rx   Depression    Essential hypertension    Fibromyalgia    GERD (gastroesophageal reflux disease)    Hiatal  hernia    Hyperlipidemia    Kidney cysts    NSTEMI (non-ST elevated myocardial infarction) (HCC)    4/10   Renal artery stenosis (HCC)    Left renal artery stent 12/13   Restless leg syndrome    Spondylosis without myelopathy    Tobacco abuse     Assessment: [name] presented with chest pain and anxiety.  Troponin elevated at 132.  Pharmacy consulted to dose IV heparin for ACS.   Heparin level came back therapeutic at 0.38, on 750 units/hr. Hgb 12.8, plt 145. No s/sx of bleeding or infusion issues.   Goal of Therapy:  Heparin level 0.3-0.7 units/ml Monitor platelets by anticoagulation protocol: Yes   Plan:  Cont heparin 750 units/hr Monitor daily HL, CBC, and for s/sx of bleeding   Thank you for allowing pharmacy to participate in this patient's care,  [name], PharmD, BCCCP Clinical Pharmacist  Phone: [phone] 11/17/2022 11:59 AM  Please check AMION for all [facility] Pharmacy phone numbers After 10:00 PM, call Main Pharmacy [phone]

## 2022-11-17 NOTE — Progress Notes (Signed)
ANTICOAGULATION CONSULT NOTE   Pharmacy Consult:  Heparin Indication: chest pain/ACS  Allergies  Allergen Reactions   Celecoxib Itching and Other (See Comments)    Other Reaction(s): Not available   Citalopram Other (See Comments) and Palpitations    Heart rate increased   Morphine And Related Other (See Comments) and Itching    Patient states she is not allergic to morphine; states had morphine with no reaction   Ciprofloxacin Hcl Other (See Comments)    Mya have made her sick or bad diarrhea   Dicyclomine Diarrhea and Other (See Comments)    Made diarrhea worse   Lamotrigine Other (See Comments) and Diarrhea    Unknown   Metoclopramide Other (See Comments)    Dizziness   Milnacipran Hcl Other (See Comments)    Unknown   Omeprazole Other (See Comments)   Pregabalin Other (See Comments)   Serotonin Reuptake Inhibitors (Ssris) Other (See Comments)   Sertraline Hcl Other (See Comments)    Stomach upset   Simvastatin Other (See Comments)    Increased back and muscle pain   Tizanidine Other (See Comments)    unknown   Trazodone And Nefazodone Other (See Comments)    hallucinations   Venlafaxine Other (See Comments)    Affected eyes, blurred vision   Codeine Itching   Metoclopramide Hcl Other (See Comments)    Dizziness    Morphine Other (See Comments)   Nefazodone Other (See Comments)   Sertraline Other (See Comments)   Tape Other (See Comments)    Skin gets red and skin pulls off, paper tape only   Sertraline Hcl Other (See Comments)    Stomach upset    Patient Measurements: Height: 4\' 11"  (149.9 cm) Weight: 68 kg (150 lb) IBW/kg (Calculated) : 43.2 Heparin Dosing Weight: 60 kg  Vital Signs: Temp: 97.7 F (36.5 C) (05/06 0114) Temp Source: Oral (05/06 0114) BP: 138/51 (05/06 0114) Pulse Rate: 67 (05/06 0114)  Labs: Recent Labs    11/16/22 1645 11/16/22 1813 11/16/22 1841 11/17/22 0223  HGB 14.6  --   --   --   HCT 43.1  --   --   --   PLT 156  --    --   --   APTT  --  29  --   --   LABPROT  --  13.2  --   --   INR  --  1.0  --   --   HEPARINUNFRC  --   --   --  0.39  CREATININE 1.09*  --   --   --   TROPONINIHS 132*  --  129*  --      Estimated Creatinine Clearance: 38.5 mL/min (A) (by C-G formula based on SCr of 1.09 mg/dL (H)).   Medical History: Past Medical History:  Diagnosis Date   Abnormal chest CT    Anxiety    Arthritis    Rhumatoid and Osteoarthritis   Carotid artery disease (HCC)    Dr. Myra Gianotti, s/p left CEA, severe R ICA stenosis   Coronary atherosclerosis of native coronary artery    DES RCA/circumflex 4/10, LVEF 65 -> repeat cath 11/2021 with multivessel CAD including LM stenosis, initial med rx   Depression    Essential hypertension    Fibromyalgia    GERD (gastroesophageal reflux disease)    Hiatal hernia    Hyperlipidemia    Kidney cysts    NSTEMI (non-ST elevated myocardial infarction) (HCC)    4/10   Renal  artery stenosis (HCC)    Left renal artery stent 12/13   Restless leg syndrome    Spondylosis without myelopathy    Tobacco abuse     Assessment: 56 YOF presented with chest pain and anxiety.  Troponin elevated at 132.  Pharmacy consulted to dose IV heparin for ACS.  CBC WNL; no bleeding reported.  5/6 AM update:  Heparin level therapeutic  Goal of Therapy:  Heparin level 0.3-0.7 units/ml Monitor platelets by anticoagulation protocol: Yes   Plan:  Cont heparin 750 units/hr 1000 heparin level  Abran Duke, PharmD, BCPS Clinical Pharmacist Phone: [phone]

## 2022-11-17 NOTE — Assessment & Plan Note (Signed)
Continue levothyroxine 

## 2022-11-17 NOTE — Assessment & Plan Note (Signed)
See above

## 2022-11-17 NOTE — Assessment & Plan Note (Signed)
Patient with known left main disease presents with accelerating chest discomfort, postexertional, relieved with rest and occurring spontaneously at rest, associated with nausea and diaphoresis.  Unfortunately, from review of her chart, this has previously been deemed to be not amenable to CABG or PCI - Continue heparin - Continue aspirin, high-dose Crestor, Imdur -Consult cardiology, appreciate expertise

## 2022-11-17 NOTE — ED Notes (Signed)
Report given to RN @[id number] 

## 2022-11-17 NOTE — Progress Notes (Signed)
TRH night cross cover note:   I notified the on-call cardiology fellow of this patient's arrival from [facility].    Newton Pigg, DO Hospitalist

## 2022-11-17 NOTE — Care Management (Signed)
  Transition of Care [facility]) Screening Note   Patient Details  Name: [name] Date of Birth: 11/19/1950   Transition of Care [facility]) CM/SW Contact:    Gala Lewandowsky, RN Phone Number: 11/17/2022, 2:43 PM    Transition of Care Department [facility]) has reviewed the patient and no TOC needs have been identified at this time. Patient presented for chest pain- plan for [facility]. We will continue to monitor patient advancement through interdisciplinary progression rounds. If new patient transition needs arise, please place a TOC consult.

## 2022-11-17 NOTE — Assessment & Plan Note (Signed)
Blood pressure high normal - Continue amlodipine, Imdur, metoprolol -Titrate up metoprolol

## 2022-11-17 NOTE — ED Notes (Signed)
Per Dr. Thomes Dinning pt has agreed to stay to be transferred to [facility]

## 2022-11-17 NOTE — Progress Notes (Signed)
  Progress Note   Patient: [name]:[id number] DOB: 1951-03-04 DOA: 11/16/2022     1 DOS: the patient was seen and examined on 11/17/2022 at 8:40 AM      Brief hospital course: [name] is a 72 y.o. 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  In the ER, troponins elevated, started on heparin and transferred to [facility].     Assessment and Plan: * Unstable angina [facility]) Patient with known left main disease presents with accelerating chest discomfort, postexertional, relieved with rest and occurring spontaneously at rest, associated with nausea and diaphoresis.  Unfortunately, from review of her chart, this has previously been deemed to be not amenable to CABG or PCI - Continue heparin - Continue aspirin, high-dose Crestor, Imdur -Consult cardiology, appreciate expertise     Hypothyroidism - Continue levothyroxine  Fibromyalgia - Continue duloxetine and sertraline  Rheumatoid arthritis (HCC) Old diagnosis, seems quiescent.  Saw Rheum a long time ago, not currently on DMARD.  IPF (idiopathic pulmonary fibrosis) (HCC) HRCT in July 2023 with some UIP, probably RA related.    PAD (peripheral artery disease) (HCC)    CAD (coronary artery disease) See above  Essential hypertension Blood pressure high normal - Continue amlodipine, Imdur, metoprolol -Titrate up metoprolol  Hyperlipidemia - Continue Crestor 40          Subjective: Patient has nausea and apprehension, she has had no chest pain overnight, no dyspnea, no leg swelling, no orthopnea, no confusion.  She still has some residual cramping in her bowels from some diarrhea last week     Physical Exam: BP (!) 141/57 (BP Location: Left Arm)   Pulse 70   Temp 97.9 F (36.6 C) (Oral)   Resp 19   Ht 4\' 11"  (1.499 m)   Wt  68 kg   SpO2 96%   BMI 30.30 kg/m   Elderly adult female, lying in bed,, appears uncomfortable and anxious RRR, no murmurs, no peripheral edema Respiratory rate normal, lungs clear without rales or wheezes Abdomen with some mild diffuse tenderness, soft, no guarding Attention normal, affect anxious, judgment and insight appear normal, moves all extremities with generalized weakness but symmetric strength    Data Reviewed: Basic metabolic panel shows mild hypokalemia, creatinine stable CBC unremarkable EKG shows T wave abnormality, nonspecific, unchanged from previous    Family Communication: None present    Disposition: Status is: Inpatient Patient was admitted with chest pain.  Evidently this is left main disease that is not amenable to intervention.  Cardiology were consulted for medical management, may need palliative consultation        Author: Alberteen Sam, MD 11/17/2022 8:55 AM  For on call review [email or web address].

## 2022-11-17 NOTE — Assessment & Plan Note (Signed)
-   Continue duloxetine and sertraline

## 2022-11-17 NOTE — Hospital Course (Addendum)
[name] is a 72 y.o. 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  In the ER, troponins elevated, started on heparin and transferred to [facility].

## 2022-11-17 NOTE — H&P (View-Only) (Signed)
 Cardiology Consultation   Patient ID: [name] MRN: [id number]; DOB: 10/10/1950  Admit date: 11/16/2022 Date of Consult: 11/17/2022  PCP:  Vyas, Dhruv B, MD   [facility] Providers Cardiologist:  Samuel McDowell, MD   {    Patient Profile:   [name] is a 71 y.o. 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 Danford.  History of Present Illness:   [name] has complex history with severe left main/multivessel CAD.  Previously patient had stents placed in the RCA and left circumflex on April 2010 and was evaluated in May 2023 and found to have multivessel disease.  May 2023 cath showed patient had severely calcified left main disease with significant pressure dampening with catheter engagement.  The Lcx was patent, but had some mild in-stent restenosis. The RCA stents were patent but had diffuse disease and subtotal occlusions of the right PDA with well-developed collaterals. Patient was evaluated by Dr. Bartle in May 2023 and not felt to be surgical candidate due to extensive aortic calcifications in the aortic root and entire aortic arch.  PCI was very high risk so medical therapy was recommended.  Off-pump LIMA to LAD was also not an option due to subclavian stenosis.  Echocardiogram during same time noted normal LVEF.  In February 2024 patient saw cardiology and reported no active anginal complaints.  Now patient is presenting with ongoing complaints of a mix of exertional and nonexertional chest pain that has worsened the past 2 weeks.  Since her catheterization in 2023 patient notes that her symptoms have gotten progressively worse.  Sometimes relieved with rest. She states that she has had other  accompanying symptoms such as intermittent diaphoresis, shortness of breath, significant weakness, numbness and tingling in her lower extremities.  The symptoms have significantly interfered with her activities of daily living and is something she thinks about all the time.  At times she will take nitroglycerin that before offered a greater benefit, but now does not provide the same relief.  She also reports other life stressors and vague complaints appear to be noncardiac in nature (knee swelling, back pain)  EKG showing normal sinus rhythm with a heart rate of 85.  No ST elevation but nonspecific T abnormalities are noted diffusely. No significant changes from prior readings. Elevated trops 132-129. She has been started on heparin and loaded on aspirin Neg CXR.  Past Medical History:  Diagnosis Date   Abnormal chest CT    Anxiety    Arthritis    Rhumatoid and Osteoarthritis   Carotid artery disease (HCC)    Dr. Brabham, s/p left CEA, severe R ICA stenosis   Coronary atherosclerosis of native coronary artery    DES RCA/circumflex 4/10, LVEF 65 -> repeat cath 11/2021 with multivessel CAD including LM stenosis, initial med rx   Depression    Essential hypertension    Fibromyalgia    GERD (gastroesophageal reflux disease)    Hiatal hernia    Hyperlipidemia    Kidney cysts    NSTEMI (non-ST elevated myocardial infarction) (HCC)    4/10   Renal artery stenosis (HCC)    Left renal artery stent 12/13   Restless leg syndrome    Spondylosis   without myelopathy    Tobacco abuse     Past Surgical History:  Procedure Laterality Date   ABDOMINAL HYSTERECTOMY  1977   BREAST LUMPECTOMY     CAROTID ENDARTERECTOMY Left 02/28/2009   CHOLECYSTECTOMY  1991   COLONOSCOPY     COLONOSCOPY N/A 05/08/2017   Procedure: COLONOSCOPY;  Surgeon: Rehman, Najeeb U, MD;  Location: [facility];  Service: Endoscopy;  Laterality: N/A;  12:00   ESOPHAGOGASTRODUODENOSCOPY N/A 04/07/2013   Procedure:  ESOPHAGOGASTRODUODENOSCOPY (EGD);  Surgeon: Najeeb U Rehman, MD;  Location: [facility];  Service: Endoscopy;  Laterality: N/A;  250   ESOPHAGOGASTRODUODENOSCOPY N/A 02/02/2014   Procedure: ESOPHAGOGASTRODUODENOSCOPY (EGD);  Surgeon: Najeeb U Rehman, MD;  Location: [facility];  Service: Endoscopy;  Laterality: N/A;  120   EYE SURGERY     Catartact bilateral   JOINT REPLACEMENT  March 2013   Left knee   JOINT REPLACEMENT  2006   Left Knee   kidney stent     Left December 2013   LEFT HEART CATH AND CORONARY ANGIOGRAPHY N/A 11/13/2021   Procedure: LEFT HEART CATH AND CORONARY ANGIOGRAPHY;  Surgeon: Arida, Muhammad A, MD;  Location: [facility];  Service: Cardiovascular;  Laterality: N/A;   LUMBAR DISC SURGERY     L5-S1   MULTIPLE TOOTH EXTRACTIONS  Jan. 2015   Neck Fusion  1996   C4 to C6   PERCUTANEOUS STENT INTERVENTION Left 06/15/2012   Procedure: PERCUTANEOUS STENT INTERVENTION;  Surgeon: Vance W Brabham, MD;  Location: [facility]ATH [facility];  Service: Cardiovascular;  Laterality: Left;  renal   PERIPHERAL VASCULAR BALLOON ANGIOPLASTY  05/26/2017   Procedure: PERIPHERAL VASCULAR BALLOON ANGIOPLASTY;  Surgeon: Brabham, Vance W, MD;  Location: [facility];  Service: Cardiovascular;;  Lt. Renal Angioplasty   POLYPECTOMY  05/08/2017   Procedure: POLYPECTOMY;  Surgeon: Rehman, Najeeb U, MD;  Location: [facility];  Service: Endoscopy;;  colon    RENAL ANGIOGRAM N/A 06/15/2012   Procedure: RENAL ANGIOGRAM;  Surgeon: Vance W Brabham, MD;  Location: [facility];  Service: Cardiovascular;  Laterality: N/A;   RENAL ANGIOGRAPHY  05/26/2017   Procedure: RENAL ANGIOGRAPHY;  Surgeon: Brabham, Vance W, MD;  Location: [facility];  Service: Cardiovascular;;   SPINE SURGERY  1994   Ruptured disc   TONSILECTOMY, ADENOIDECTOMY, BILATERAL MYRINGOTOMY AND TUBES  1969   TOTAL KNEE ARTHROPLASTY     Left   TUBAL LIGATION       Inpatient Medications: Scheduled Meds:  alum & mag  hydroxide-simeth  30 mL Oral Once   amLODipine  5 mg Oral Daily   aspirin EC  81 mg Oral Daily   DULoxetine  20 mg Oral QHS   isosorbide mononitrate  30 mg Oral Daily   levothyroxine  25 mcg Oral Q0600   metoprolol succinate  100 mg Oral Daily   pantoprazole  40 mg Oral Daily   rosuvastatin  40 mg Oral QHS   sertraline  50 mg Oral Daily   sodium chloride flush  3 mL Intravenous Q12H   Continuous Infusions:  sodium chloride     heparin 750 Units/hr (11/17/22 0559)   PRN Meds: sodium chloride, ALPRAZolam, fentaNYL (SUBLIMAZE) injection, naLOXone (NARCAN)  injection, nitroGLYCERIN, ondansetron **OR** ondansetron (ZOFRAN) IV, oxyCODONE-acetaminophen, sodium chloride flush  Allergies:    Allergies  Allergen Reactions   Celecoxib Itching and Other (See Comments)    Other Reaction(s): Not available   Citalopram Other (See Comments) and Palpitations      Heart rate increased   Morphine And Related Other (See Comments) and Itching    Patient states she is not allergic to morphine; states had morphine with no reaction   Ciprofloxacin Hcl Other (See Comments)    Mya have made her sick or bad diarrhea   Dicyclomine Diarrhea and Other (See Comments)    Made diarrhea worse   Lamotrigine Other (See Comments) and Diarrhea    Unknown   Metoclopramide Other (See Comments)    Dizziness   Milnacipran Hcl Other (See Comments)    Unknown   Omeprazole Other (See Comments)   Pregabalin Other (See Comments)   Serotonin Reuptake Inhibitors (Ssris) Other (See Comments)   Sertraline Hcl Other (See Comments)    Stomach upset   Simvastatin Other (See Comments)    Increased back and muscle pain   Tizanidine Other (See Comments)    unknown   Trazodone And Nefazodone Other (See Comments)    hallucinations   Venlafaxine Other (See Comments)    Affected eyes, blurred vision   Codeine Itching   Metoclopramide Hcl Other (See Comments)    Dizziness    Morphine Other (See Comments)   Nefazodone Other  (See Comments)   Sertraline Other (See Comments)   Tape Other (See Comments)    Skin gets red and skin pulls off, paper tape only   Sertraline Hcl Other (See Comments)    Stomach upset    Social History:   Social History   Socioeconomic History   Marital status: Divorced    Spouse name: Not on file   Number of children: Not on file   Years of education: Not on file   Highest education level: Not on file  Occupational History   Not on file  Tobacco Use   Smoking status: Former    Years: 45    Types: Cigarettes    Quit date: 02/18/2022    Years since quitting: 0.7   Smokeless tobacco: Never  Vaping Use   Vaping Use: Never used  Substance and Sexual Activity   Alcohol use: No    Alcohol/week: 0.0 standard drinks of alcohol   Drug use: No   Sexual activity: Not on file  Other Topics Concern   Not on file  Social History Narrative   Not on file   Social Determinants of Health   Financial Resource Strain: Not on file  Food Insecurity: Not on file  Transportation Needs: Not on file  Physical Activity: Not on file  Stress: Not on file  Social Connections: Not on file  Intimate Partner Violence: Not on file    Family History:   Family History  Problem Relation Age of Onset   Other Mother        Cerebral hemorrage   Heart disease Mother    Heart attack Mother    Hyperlipidemia Mother    Heart disease Father    Heart attack Father    Hyperlipidemia Father    Heart disease Brother        Heart Disease before age 60   Diabetes Brother    Hypertension Brother    Heart attack Brother    Cancer Sister    Arthritis Sister    Heart disease Sister    Heart disease Daughter 44       Heart Disease before age 60   Heart attack Daughter    Hypertension Daughter    Cancer Sister    Arthritis Sister    Diabetes Brother          Varicose  Veins   Peripheral vascular disease Brother    Diabetes Brother      ROS:  Please see the history of present illness. All other  ROS reviewed and negative.     Physical Exam/Data:   Vitals:   11/17/22 0010 11/17/22 0114 11/17/22 0408 11/17/22 0839  BP: (!) 147/62 (!) 138/51 (!) 122/43 (!) 141/57  Pulse: 75 67 70 70  Resp: (!) 23 20 20 19  Temp:  97.7 F (36.5 C) 97.8 F (36.6 C) 97.9 F (36.6 C)  TempSrc:  Oral Oral Oral  SpO2: 96% 97% 96%   Weight:      Height:        Intake/Output Summary (Last 24 hours) at 11/17/2022 0901 Last data filed at 11/17/2022 0559 Gross per 24 hour  Intake 517.08 ml  Output --  Net 517.08 ml      11/16/2022    4:40 PM 10/06/2022   11:05 AM 09/04/2022    2:28 PM  Last 3 Weights  Weight (lbs) 150 lb 162 lb 157 lb  Weight (kg) 68.04 kg 73.483 kg 71.215 kg     Body mass index is 30.3 kg/m.  General:  Well nourished, well developed, in no acute distress HEENT: normal Neck: no JVD Vascular: No carotid bruits; Distal pulses 2+ bilaterally Cardiac:  normal S1, S2; RRR; no murmur  Lungs:  clear to auscultation bilaterally, no wheezing, rhonchi or rales  Abd: soft, nontender, no hepatomegaly  Ext: no edema Musculoskeletal:  No deformities, BUE and BLE strength normal and equal. + trace edema  Skin: warm and dry  Neuro:  CNs 2-12 intact, no focal abnormalities noted Psych:  Normal affect   EKG:  The EKG was personally reviewed and demonstrates:  EKG showing normal sinus rhythm with a heart rate of 85.  No ST elevation but nonspecific T abnormalities are noted diffusely. No changes from prior readings. Telemetry:  Telemetry was personally reviewed and demonstrates:  NSR HR 70s  Relevant CV Studies: Left heart catheterization 11/13/2021   Ost LM to Mid LM lesion is 90% stenosed.   Mid Cx lesion is 20% stenosed.   Mid Cx to Dist Cx lesion is 70% stenosed.   Prox Cx lesion is 60% stenosed.   Ost RCA to Prox RCA lesion is 20% stenosed.   Prox RCA to Mid RCA lesion is 30% stenosed.   RPDA lesion is 90% stenosed.   Prox RCA lesion is 60% stenosed.   RPAV lesion is 60% stenosed.    1.  Severe calcified ostial left main stenosis with significant pressure dampening with catheter engagement.  Patent left circumflex disease with mild in-stent restenosis and progression of native vessel disease in the left circumflex.  Patent RCA stents with diffuse disease in multiple areas with subtotal occlusion of the right PDA with well-developed left-to-right collaterals. 2.  Left ventricular angiography was not performed.  EF was normal by echo.  Severely elevated blood pressure with moderately elevated left ventricular end-diastolic pressure and no significant gradient across the aortic valve.   Recommendations: Due to severe left main disease and patient's symptoms, recommend hospital admission for CABG evaluation.  The RCA might not have good targets for CABG but the right PDA has excellent collaterals from the left.  Echocardiogram 09/19/2021 1. Left ventricular ejection fraction, by estimation, is 60 to 65%. The  left ventricle has normal function. The left ventricle has no regional  wall motion abnormalities. Left ventricular diastolic parameters are  consistent with Grade   I diastolic  dysfunction (impaired relaxation). Elevated left atrial pressure.   2. Right ventricular systolic function is normal. The right ventricular  size is normal.   3. The mitral valve is abnormal. Mild mitral valve regurgitation. No  evidence of mitral stenosis.   4. The aortic valve is tricuspid. There is mild calcification of the  aortic valve. There is mild thickening of the aortic valve. Aortic valve  regurgitation is not visualized. No aortic stenosis is present.   Laboratory Data:  High Sensitivity Troponin:   Recent Labs  Lab 11/16/22 1645 11/16/22 1841  TROPONINIHS 132* 129*     Chemistry Recent Labs  Lab 11/16/22 1645 11/17/22 0223  NA 135 135  K 4.0 3.3*  CL 101 102  CO2 23 25  GLUCOSE 111* 102*  BUN 18 15  CREATININE 1.09* 1.11*  CALCIUM 9.1 8.6*  GFRNONAA 54* 52*   ANIONGAP 11 8    Recent Labs  Lab 11/17/22 0223  PROT 6.3*  ALBUMIN 3.5  AST 17  ALT 13  ALKPHOS 64  BILITOT 0.5   Lipids No results for input(s): "CHOL", "TRIG", "HDL", "LABVLDL", "LDLCALC", "CHOLHDL" in the last 168 hours.  Hematology Recent Labs  Lab 11/16/22 1645 11/17/22 0223  WBC 8.0 6.9  RBC 4.56 4.07  HGB 14.6 12.8  HCT 43.1 38.9  MCV 94.5 95.6  MCH 32.0 31.4  MCHC 33.9 32.9  RDW 13.1 13.2  PLT 156 145*   Thyroid No results for input(s): "TSH", "FREET4" in the last 168 hours.  BNPNo results for input(s): "BNP", "PROBNP" in the last 168 hours.  DDimer No results for input(s): "DDIMER" in the last 168 hours.   Radiology/Studies:  DG Chest Port 1 View  Result Date: 11/16/2022 CLINICAL DATA:  Chest pain. EXAM: PORTABLE CHEST 1 VIEW COMPARISON:  CT chest dated January 13, 2022 FINDINGS: The heart is enlarged on this AP projection. Prominent aortic atherosclerotic calcifications. Lungs are clear without evidence of focal consolidation or pleural effusion. Partially imaged anterior cervical discectomy and fusion hardware. No acute osseous abnormality. IMPRESSION: Cardiomegaly. No acute cardiopulmonary process. Electronically Signed   By: Imran  Ahmed D.O.   On: 11/16/2022 17:18     Assessment and Plan:   Chest pain  CAD with severe multivessel disease to L main 90%, Lcx 70%, PDA 90% and prior history of DES to RCA and Lcx Patient presenting with worsening and progressive symptoms of chest pain with increased accompanied symptoms such as weakness, shortness of breath, diaphoresis, numbness and tingling.  EKG not showing any ST elevations and nonspecific T wave abnormalities unchanged from prior readings.  Elevated troponins 132-129.  Dr. Bartle evaluated patient in 2023 and felt that she was not a surgical candidate due to extensive aortic calcifications in the aortic root and entire aortic arch.  PCI was very high risk so medical therapy was recommended.  Off-pump LIMA to LAD  was also not an option due to subclavian stenosis.  Will discuss with MD about plans of continued medical management or if intervention is warranted given worsening and progressive symptoms that have significantly interfered with ADLs.  Potential plans of medical management in intervention have been discussed with patient, patient still unsure about what she would want. Continue aspirin 81 mg, Toprol XL 50 mg daily, rosuvastatin 40 mg PTA Imdur 30 mg daily.  Will increase dose to 60mg Will also order an echo, previously normal EF however does have clinical sxs of HF that could also be attributed to underlying CAD.   Continue heparin drip, has been loaded on aspirin 325 mg.  *Addendum will plan for cath tomorrow with possible PCI with Dr. Thukkani  Shared Decision Making/Informed Consent{  The risks [stroke (1 in 1000), death (1 in 1000), kidney failure [usually temporary] (1 in 500), bleeding (1 in 200), allergic reaction [possibly serious] (1 in 200)], benefits (diagnostic support and management of coronary artery disease) and alternatives of a cardiac catheterization were discussed in detail with [name] and she is willing to proceed.   HTN Has been very variable in hospital, however most readings are between 120-150 systolic.  Continue Toprol XL 50 mg and amlodipine 5mg.  Hyperlipidemia Patient states that she is not tolerating rosuvastatin due to myaglias and stated worsened back pain. Will reorder lipid panel and then decide alternative therapies. Consider PCSK9 if intolerant, however it does seem that she is willing to continue.   Carotid artery disease s/p L CEA with severe R ICA stenosis Asx, managed conservatively. Followed by Dr. Bragham. Continue risk reduction with aspirin and statin.   Risk Assessment/Risk Scores:   TIMI Risk Score for Unstable Angina or Non-ST Elevation MI:   The patient's TIMI risk score is 6, which indicates a 41% risk of all cause mortality, new or recurrent  myocardial infarction or need for urgent revascularization in the next 14 days.{  For questions or updates, please contact [facility] Please consult [email or web address] for contact info under    Signed, Sheng L Haley, PA-C  11/17/2022 9:01 AM   I have examined the patient and reviewed assessment and plan and discussed with patient.  Agree with above as stated.    I reviewed her cath films from last year.  She has complex, calcific ostial left main disease.  She has relatively small coronary arteries.  Her left main is also very short.  Her distal RCA is occluded.  She is now having more exertional angina with even moving around the house.  Will check echocardiogram.  Hopefully, LVEF remains normal.  Plan for cath tomorrow.  Will need some type of calcium modification of the left main but would need to consider high risk PCI later in the week given recurrent symptoms and the fact that she has been turned down for CABG in 2023.  Of note, she has left subclavian stenosis.  Left radial pulse 1+, right radial pulse 2+.  She does not report left arm claudication.  Huldah Marin  

## 2022-11-17 NOTE — ED Notes (Signed)
[facility] arrived to transport pt 

## 2022-11-17 NOTE — Consult Note (Addendum)
Cardiology Consultation   Patient ID: [name] MRN: [id number]; DOB: [birth year]  Admit date: 11/16/2022 Date of Consult: 11/17/2022  PCP:  Ignatius Specking, MD   [facility] Providers Cardiologist:  Nona Dell, MD   {    Patient Profile:   [name] is a 72 y.o. 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 Maryfrances Bunnell.  History of Present Illness:   [name] has complex history with severe left main/multivessel CAD.  Previously patient had stents placed in the RCA and left circumflex on April 2010 and was evaluated in May 2023 and found to have multivessel disease.  May 2023 cath showed patient had severely calcified left main disease with significant pressure dampening with catheter engagement.  The Lcx was patent, but had some mild in-stent restenosis. The RCA stents were patent but had diffuse disease and subtotal occlusions of the right PDA with well-developed collaterals. Patient was evaluated by Dr. Laneta Simmers in May 2023 and not felt to be surgical candidate due to extensive aortic calcifications in the aortic root and entire aortic arch.  PCI was very high risk so medical therapy was recommended.  Off-pump LIMA to LAD was also not an option due to subclavian stenosis.  Echocardiogram during same time noted normal LVEF.  In February 2024 patient saw cardiology and reported no active anginal complaints.  Now patient is presenting with ongoing complaints of a mix of exertional and nonexertional chest pain that has worsened the past 2 weeks.  Since her catheterization in 2023 patient notes that her symptoms have gotten progressively worse.  Sometimes relieved with rest. She states that she has had other  accompanying symptoms such as intermittent diaphoresis, shortness of breath, significant weakness, numbness and tingling in her lower extremities.  The symptoms have significantly interfered with her activities of daily living and is something she thinks about all the time.  At times she will take nitroglycerin that before offered a greater benefit, but now does not provide the same relief.  She also reports other life stressors and vague complaints appear to be noncardiac in nature (knee swelling, back pain)  EKG showing normal sinus rhythm with a heart rate of 85.  No ST elevation but nonspecific T abnormalities are noted diffusely. No significant changes from prior readings. Elevated trops 132-129. She has been started on heparin and loaded on aspirin Neg CXR.  Past Medical History:  Diagnosis Date   Abnormal chest CT    Anxiety    Arthritis    Rhumatoid and Osteoarthritis   Carotid artery disease (HCC)    Dr. Myra Gianotti, s/p left CEA, severe R ICA stenosis   Coronary atherosclerosis of native coronary artery    DES RCA/circumflex 4/10, LVEF 65 -> repeat cath 11/2021 with multivessel CAD including LM stenosis, initial med rx   Depression    Essential hypertension    Fibromyalgia    GERD (gastroesophageal reflux disease)    Hiatal hernia    Hyperlipidemia    Kidney cysts    NSTEMI (non-ST elevated myocardial infarction) (HCC)    4/10   Renal artery stenosis (HCC)    Left renal artery stent 12/13   Restless leg syndrome    Spondylosis  without myelopathy    Tobacco abuse     Past Surgical History:  Procedure Laterality Date   ABDOMINAL HYSTERECTOMY  1977   BREAST LUMPECTOMY     CAROTID ENDARTERECTOMY Left 02/28/2009   CHOLECYSTECTOMY  1991   COLONOSCOPY     COLONOSCOPY N/A 05/08/2017   Procedure: COLONOSCOPY;  Surgeon: Malissa Hippo, MD;  Location: [facility];  Service: Endoscopy;  Laterality: N/A;  12:00   ESOPHAGOGASTRODUODENOSCOPY N/A 04/07/2013   Procedure:  ESOPHAGOGASTRODUODENOSCOPY (EGD);  Surgeon: Malissa Hippo, MD;  Location: [facility];  Service: Endoscopy;  Laterality: N/A;  250   ESOPHAGOGASTRODUODENOSCOPY N/A 02/02/2014   Procedure: ESOPHAGOGASTRODUODENOSCOPY (EGD);  Surgeon: Malissa Hippo, MD;  Location: [facility];  Service: Endoscopy;  Laterality: N/A;  120   EYE SURGERY     Catartact bilateral   JOINT REPLACEMENT  March 2013   Left knee   JOINT REPLACEMENT  2006   Left Knee   kidney stent     Left December 2013   LEFT HEART CATH AND CORONARY ANGIOGRAPHY N/A 11/13/2021   Procedure: LEFT HEART CATH AND CORONARY ANGIOGRAPHY;  Surgeon: Iran Ouch, MD;  Location: [facility];  Service: Cardiovascular;  Laterality: N/A;   LUMBAR DISC SURGERY     L5-S1   MULTIPLE TOOTH EXTRACTIONS  Jan. 2015   Neck Fusion  1996   C4 to C6   PERCUTANEOUS STENT INTERVENTION Left 06/15/2012   Procedure: PERCUTANEOUS STENT INTERVENTION;  Surgeon: Nada Libman, MD;  Location: [facility] CATH LAB;  Service: Cardiovascular;  Laterality: Left;  renal   PERIPHERAL VASCULAR BALLOON ANGIOPLASTY  05/26/2017   Procedure: PERIPHERAL VASCULAR BALLOON ANGIOPLASTY;  Surgeon: Nada Libman, MD;  Location: [facility];  Service: Cardiovascular;;  Lt. Renal Angioplasty   POLYPECTOMY  05/08/2017   Procedure: POLYPECTOMY;  Surgeon: Malissa Hippo, MD;  Location: [facility];  Service: Endoscopy;;  colon    RENAL ANGIOGRAM N/A 06/15/2012   Procedure: RENAL ANGIOGRAM;  Surgeon: Nada Libman, MD;  Location: [facility] CATH LAB;  Service: Cardiovascular;  Laterality: N/A;   RENAL ANGIOGRAPHY  05/26/2017   Procedure: RENAL ANGIOGRAPHY;  Surgeon: Nada Libman, MD;  Location: [facility];  Service: Cardiovascular;;   SPINE SURGERY  1994   Ruptured disc   TONSILECTOMY, ADENOIDECTOMY, BILATERAL MYRINGOTOMY AND TUBES  1969   TOTAL KNEE ARTHROPLASTY     Left   TUBAL LIGATION       Inpatient Medications: Scheduled Meds:  alum & mag  hydroxide-simeth  30 mL Oral Once   amLODipine  5 mg Oral Daily   aspirin EC  81 mg Oral Daily   DULoxetine  20 mg Oral QHS   isosorbide mononitrate  30 mg Oral Daily   levothyroxine  25 mcg Oral Q0600   metoprolol succinate  100 mg Oral Daily   pantoprazole  40 mg Oral Daily   rosuvastatin  40 mg Oral QHS   sertraline  50 mg Oral Daily   sodium chloride flush  3 mL Intravenous Q12H   Continuous Infusions:  sodium chloride     heparin 750 Units/hr (11/17/22 0559)   PRN Meds: sodium chloride, ALPRAZolam, fentaNYL (SUBLIMAZE) injection, naLOXone (NARCAN)  injection, nitroGLYCERIN, ondansetron **OR** ondansetron (ZOFRAN) IV, oxyCODONE-acetaminophen, sodium chloride flush  Allergies:    Allergies  Allergen Reactions   Celecoxib Itching and Other (See Comments)    Other Reaction(s): Not available   Citalopram Other (See Comments) and Palpitations  Heart rate increased   Morphine And Related Other (See Comments) and Itching    Patient states she is not allergic to morphine; states had morphine with no reaction   Ciprofloxacin Hcl Other (See Comments)    Mya have made her sick or bad diarrhea   Dicyclomine Diarrhea and Other (See Comments)    Made diarrhea worse   Lamotrigine Other (See Comments) and Diarrhea    Unknown   Metoclopramide Other (See Comments)    Dizziness   Milnacipran Hcl Other (See Comments)    Unknown   Omeprazole Other (See Comments)   Pregabalin Other (See Comments)   Serotonin Reuptake Inhibitors (Ssris) Other (See Comments)   Sertraline Hcl Other (See Comments)    Stomach upset   Simvastatin Other (See Comments)    Increased back and muscle pain   Tizanidine Other (See Comments)    unknown   Trazodone And Nefazodone Other (See Comments)    hallucinations   Venlafaxine Other (See Comments)    Affected eyes, blurred vision   Codeine Itching   Metoclopramide Hcl Other (See Comments)    Dizziness    Morphine Other (See Comments)   Nefazodone Other  (See Comments)   Sertraline Other (See Comments)   Tape Other (See Comments)    Skin gets red and skin pulls off, paper tape only   Sertraline Hcl Other (See Comments)    Stomach upset    Social History:   Social History   Socioeconomic History   Marital status: Divorced    Spouse name: Not on file   Number of children: Not on file   Years of education: Not on file   Highest education level: Not on file  Occupational History   Not on file  Tobacco Use   Smoking status: Former    Years: 45    Types: Cigarettes    Quit date: 02/18/2022    Years since quitting: 0.7   Smokeless tobacco: Never  Vaping Use   Vaping Use: Never used  Substance and Sexual Activity   Alcohol use: No    Alcohol/week: 0.0 standard drinks of alcohol   Drug use: No   Sexual activity: Not on file  Other Topics Concern   Not on file  Social History Narrative   Not on file   Social Determinants of Health   Financial Resource Strain: Not on file  Food Insecurity: Not on file  Transportation Needs: Not on file  Physical Activity: Not on file  Stress: Not on file  Social Connections: Not on file  Intimate Partner Violence: Not on file    Family History:   Family History  Problem Relation Age of Onset   Other Mother        Cerebral hemorrage   Heart disease Mother    Heart attack Mother    Hyperlipidemia Mother    Heart disease Father    Heart attack Father    Hyperlipidemia Father    Heart disease Brother        Heart Disease before age 69   Diabetes Brother    Hypertension Brother    Heart attack Brother    Cancer Sister    Arthritis Sister    Heart disease Sister    Heart disease Daughter 4       Heart Disease before age 59   Heart attack Daughter    Hypertension Daughter    Cancer Sister    Arthritis Sister    Diabetes Brother  Varicose  Veins   Peripheral vascular disease Brother    Diabetes Brother      ROS:  Please see the history of present illness. All other  ROS reviewed and negative.     Physical Exam/Data:   Vitals:   11/17/22 0010 11/17/22 0114 11/17/22 0408 11/17/22 0839  BP: (!) 147/62 (!) 138/51 (!) 122/43 (!) 141/57  Pulse: 75 67 70 70  Resp: (!) 23 20 20 19   Temp:  97.7 F (36.5 C) 97.8 F (36.6 C) 97.9 F (36.6 C)  TempSrc:  Oral Oral Oral  SpO2: 96% 97% 96%   Weight:      Height:        Intake/Output Summary (Last 24 hours) at 11/17/2022 0901 Last data filed at 11/17/2022 0559 Gross per 24 hour  Intake 517.08 ml  Output --  Net 517.08 ml      11/16/2022    4:40 PM 10/06/2022   11:05 AM 09/04/2022    2:28 PM  Last 3 Weights  Weight (lbs) 150 lb 162 lb 157 lb  Weight (kg) 68.04 kg 73.483 kg 71.215 kg     Body mass index is 30.3 kg/m.  General:  Well nourished, well developed, in no acute distress HEENT: normal Neck: no JVD Vascular: No carotid bruits; Distal pulses 2+ bilaterally Cardiac:  normal S1, S2; RRR; no murmur  Lungs:  clear to auscultation bilaterally, no wheezing, rhonchi or rales  Abd: soft, nontender, no hepatomegaly  Ext: no edema Musculoskeletal:  No deformities, BUE and BLE strength normal and equal. + trace edema  Skin: warm and dry  Neuro:  CNs 2-12 intact, no focal abnormalities noted Psych:  Normal affect   EKG:  The EKG was personally reviewed and demonstrates:  EKG showing normal sinus rhythm with a heart rate of 85.  No ST elevation but nonspecific T abnormalities are noted diffusely. No changes from prior readings. Telemetry:  Telemetry was personally reviewed and demonstrates:  NSR HR 70s  Relevant CV Studies: Left heart catheterization 11/13/2021   Ost LM to Mid LM lesion is 90% stenosed.   Mid Cx lesion is 20% stenosed.   Mid Cx to Dist Cx lesion is 70% stenosed.   Prox Cx lesion is 60% stenosed.   Ost RCA to Prox RCA lesion is 20% stenosed.   Prox RCA to Mid RCA lesion is 30% stenosed.   RPDA lesion is 90% stenosed.   Prox RCA lesion is 60% stenosed.   RPAV lesion is 60% stenosed.    1.  Severe calcified ostial left main stenosis with significant pressure dampening with catheter engagement.  Patent left circumflex disease with mild in-stent restenosis and progression of native vessel disease in the left circumflex.  Patent RCA stents with diffuse disease in multiple areas with subtotal occlusion of the right PDA with well-developed left-to-right collaterals. 2.  Left ventricular angiography was not performed.  EF was normal by echo.  Severely elevated blood pressure with moderately elevated left ventricular end-diastolic pressure and no significant gradient across the aortic valve.   Recommendations: Due to severe left main disease and patient's symptoms, recommend hospital admission for CABG evaluation.  The RCA might not have good targets for CABG but the right PDA has excellent collaterals from the left.  Echocardiogram 09/19/2021 1. Left ventricular ejection fraction, by estimation, is 60 to 65%. The  left ventricle has normal function. The left ventricle has no regional  wall motion abnormalities. Left ventricular diastolic parameters are  consistent with Grade  I diastolic  dysfunction (impaired relaxation). Elevated left atrial pressure.   2. Right ventricular systolic function is normal. The right ventricular  size is normal.   3. The mitral valve is abnormal. Mild mitral valve regurgitation. No  evidence of mitral stenosis.   4. The aortic valve is tricuspid. There is mild calcification of the  aortic valve. There is mild thickening of the aortic valve. Aortic valve  regurgitation is not visualized. No aortic stenosis is present.   Laboratory Data:  High Sensitivity Troponin:   Recent Labs  Lab 11/16/22 1645 11/16/22 1841  TROPONINIHS 132* 129*     Chemistry Recent Labs  Lab 11/16/22 1645 11/17/22 0223  NA 135 135  K 4.0 3.3*  CL 101 102  CO2 23 25  GLUCOSE 111* 102*  BUN 18 15  CREATININE 1.09* 1.11*  CALCIUM 9.1 8.6*  GFRNONAA 54* 52*   ANIONGAP 11 8    Recent Labs  Lab 11/17/22 0223  PROT 6.3*  ALBUMIN 3.5  AST 17  ALT 13  ALKPHOS 64  BILITOT 0.5   Lipids No results for input(s): "CHOL", "TRIG", "HDL", "LABVLDL", "LDLCALC", "CHOLHDL" in the last 168 hours.  Hematology Recent Labs  Lab 11/16/22 1645 11/17/22 0223  WBC 8.0 6.9  RBC 4.56 4.07  HGB 14.6 12.8  HCT 43.1 38.9  MCV 94.5 95.6  MCH 32.0 31.4  MCHC 33.9 32.9  RDW 13.1 13.2  PLT 156 145*   Thyroid No results for input(s): "TSH", "FREET4" in the last 168 hours.  BNPNo results for input(s): "BNP", "PROBNP" in the last 168 hours.  DDimer No results for input(s): "DDIMER" in the last 168 hours.   Radiology/Studies:  DG Chest Port 1 View  Result Date: 11/16/2022 CLINICAL DATA:  Chest pain. EXAM: PORTABLE CHEST 1 VIEW COMPARISON:  CT chest dated January 13, 2022 FINDINGS: The heart is enlarged on this AP projection. Prominent aortic atherosclerotic calcifications. Lungs are clear without evidence of focal consolidation or pleural effusion. Partially imaged anterior cervical discectomy and fusion hardware. No acute osseous abnormality. IMPRESSION: Cardiomegaly. No acute cardiopulmonary process. Electronically Signed   By: Larose Hires D.O.   On: 11/16/2022 17:18     Assessment and Plan:   Chest pain  CAD with severe multivessel disease to L main 90%, Lcx 70%, PDA 90% and prior history of DES to RCA and Lcx Patient presenting with worsening and progressive symptoms of chest pain with increased accompanied symptoms such as weakness, shortness of breath, diaphoresis, numbness and tingling.  EKG not showing any ST elevations and nonspecific T wave abnormalities unchanged from prior readings.  Elevated troponins 132-129.  Dr. Laneta Simmers evaluated patient in 2023 and felt that she was not a surgical candidate due to extensive aortic calcifications in the aortic root and entire aortic arch.  PCI was very high risk so medical therapy was recommended.  Off-pump LIMA to LAD  was also not an option due to subclavian stenosis.  Will discuss with MD about plans of continued medical management or if intervention is warranted given worsening and progressive symptoms that have significantly interfered with ADLs.  Potential plans of medical management in intervention have been discussed with patient, patient still unsure about what she would want. Continue aspirin 81 mg, Toprol XL 50 mg daily, rosuvastatin 40 mg PTA Imdur 30 mg daily.  Will increase dose to 60mg  Will also order an echo, previously normal EF however does have clinical sxs of HF that could also be attributed to underlying CAD.  Continue heparin drip, has been loaded on aspirin 325 mg.  *Addendum will plan for cath tomorrow with possible PCI with Dr. Lynnette Caffey  Shared Decision Making/Informed Consent{  The risks [stroke (1 in 1000), death (1 in 1000), kidney failure [usually temporary] (1 in 500), bleeding (1 in 200), allergic reaction [possibly serious] (1 in 200)], benefits (diagnostic support and management of coronary artery disease) and alternatives of a cardiac catheterization were discussed in detail with Ms. Castelli and she is willing to proceed.   HTN Has been very variable in hospital, however most readings are between 120-150 systolic.  Continue Toprol XL 50 mg and amlodipine 5mg .  Hyperlipidemia Patient states that she is not tolerating rosuvastatin due to myaglias and stated worsened back pain. Will reorder lipid panel and then decide alternative therapies. Consider PCSK9 if intolerant, however it does seem that she is willing to continue.   Carotid artery disease s/p L CEA with severe R ICA stenosis Asx, managed conservatively. Followed by Dr. Moreen Fowler. Continue risk reduction with aspirin and statin.   Risk Assessment/Risk Scores:   TIMI Risk Score for Unstable Angina or Non-ST Elevation MI:   The patient's TIMI risk score is 6, which indicates a 41% risk of all cause mortality, new or recurrent  myocardial infarction or need for urgent revascularization in the next 14 days.{  For questions or updates, please contact [facility] Please consult [email or web address] for contact info under    Signed, Abagail Kitchens, PA-C  11/17/2022 9:01 AM   I have examined the patient and reviewed assessment and plan and discussed with patient.  Agree with above as stated.    I reviewed her cath films from last year.  She has complex, calcific ostial left main disease.  She has relatively small coronary arteries.  Her left main is also very short.  Her distal RCA is occluded.  She is now having more exertional angina with even moving around the house.  Will check echocardiogram.  Hopefully, LVEF remains normal.  Plan for cath tomorrow.  Will need some type of calcium modification of the left main but would need to consider high risk PCI later in the week given recurrent symptoms and the fact that she has been turned down for CABG in 2023.  Of note, she has left subclavian stenosis.  Left radial pulse 1+, right radial pulse 2+.  She does not report left arm claudication.  Lance Muss

## 2022-11-17 NOTE — Assessment & Plan Note (Signed)
-   Continue Crestor 40 - PCSK9 per cardiology

## 2022-11-17 NOTE — Assessment & Plan Note (Signed)
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

## 2022-11-17 NOTE — Assessment & Plan Note (Signed)
Old diagnosis, seems quiescent.  Saw Rheum a long time ago, not currently on DMARD.

## 2022-11-18 ENCOUNTER — Encounter (HOSPITAL_COMMUNITY): Payer: Self-pay | Admitting: Cardiology

## 2022-11-18 ENCOUNTER — Encounter (HOSPITAL_COMMUNITY)
Admission: EM | Disposition: A | Discharge status: Home / Self Care | Payer: Self-pay | Source: Home / Self Care | Attending: Family Medicine

## 2022-11-18 DIAGNOSIS — I25110 Atherosclerotic heart disease of native coronary artery with unstable angina pectoris: Secondary | ICD-10-CM | POA: Diagnosis not present

## 2022-11-18 DIAGNOSIS — N1832 Chronic kidney disease, stage 3b: Secondary | ICD-10-CM | POA: Insufficient documentation

## 2022-11-18 DIAGNOSIS — I2510 Atherosclerotic heart disease of native coronary artery without angina pectoris: Secondary | ICD-10-CM | POA: Diagnosis not present

## 2022-11-18 DIAGNOSIS — E7849 Other hyperlipidemia: Secondary | ICD-10-CM | POA: Diagnosis not present

## 2022-11-18 DIAGNOSIS — M797 Fibromyalgia: Secondary | ICD-10-CM | POA: Diagnosis not present

## 2022-11-18 DIAGNOSIS — I10 Essential (primary) hypertension: Secondary | ICD-10-CM | POA: Diagnosis not present

## 2022-11-18 DIAGNOSIS — E669 Obesity, unspecified: Secondary | ICD-10-CM | POA: Insufficient documentation

## 2022-11-18 HISTORY — PX: LEFT HEART CATH AND CORONARY ANGIOGRAPHY: CATH118249

## 2022-11-18 LAB — BASIC METABOLIC PANEL
Anion gap: 10 (ref 5–15)
BUN: 22 mg/dL (ref 8–23)
CO2: 22 mmol/L (ref 22–32)
Calcium: 8.7 mg/dL — ABNORMAL LOW (ref 8.9–10.3)
Chloride: 105 mmol/L (ref 98–111)
Creatinine, Ser: 1.36 mg/dL — ABNORMAL HIGH (ref 0.44–1.00)
GFR, Estimated: 41 mL/min — ABNORMAL LOW (ref >60)
Glucose, Bld: 129 mg/dL — ABNORMAL HIGH (ref 70–99)
Potassium: 3.9 mmol/L (ref 3.5–5.1)
Sodium: 137 mmol/L (ref 135–145)

## 2022-11-18 LAB — CBC
HCT: 38.4 % (ref 36.0–46.0)
Hemoglobin: 13.1 g/dL (ref 12.0–15.0)
MCH: 32.2 pg (ref 26.0–34.0)
MCHC: 34.1 g/dL (ref 30.0–36.0)
MCV: 94.3 fL (ref 80.0–100.0)
Platelets: 137 10*3/uL — ABNORMAL LOW (ref 150–400)
RBC: 4.07 MIL/uL (ref 3.87–5.11)
RDW: 13.1 % (ref 11.5–15.5)
WBC: 5.4 10*3/uL (ref 4.0–10.5)
nRBC: 0 % (ref 0.0–0.2)

## 2022-11-18 LAB — LIPID PANEL
Cholesterol: 124 mg/dL (ref 0–200)
HDL: 50 mg/dL (ref >40)
LDL Cholesterol: 44 mg/dL (ref 0–99)
Total CHOL/HDL Ratio: 2.5 RATIO
Triglycerides: 149 mg/dL (ref <150)
VLDL: 30 mg/dL (ref 0–40)

## 2022-11-18 LAB — HEPARIN LEVEL (UNFRACTIONATED): Heparin Unfractionated: 0.69 IU/mL (ref 0.30–0.70)

## 2022-11-18 SURGERY — LEFT HEART CATH AND CORONARY ANGIOGRAPHY
Anesthesia: LOCAL

## 2022-11-18 MED ORDER — VERAPAMIL HCL 2.5 MG/ML IV SOLN
INTRAVENOUS | Status: AC
  Filled 2022-11-18: qty 2

## 2022-11-18 MED ORDER — SODIUM CHLORIDE 0.9 % WEIGHT BASED INFUSION
1.0000 mL/kg/h | INTRAVENOUS | Status: DC
  Administered 2022-11-19: 1 mL/kg/h via INTRAVENOUS

## 2022-11-18 MED ORDER — LIDOCAINE HCL (PF) 1 % IJ SOLN
INTRAMUSCULAR | Status: AC
  Filled 2022-11-18: qty 30

## 2022-11-18 MED ORDER — HEPARIN (PORCINE) 25000 UT/250ML-% IV SOLN
700.0000 [IU]/h | INTRAVENOUS | Status: DC
  Administered 2022-11-18: 700 [IU]/h via INTRAVENOUS
  Filled 2022-11-18: qty 250

## 2022-11-18 MED ORDER — HEPARIN (PORCINE) IN NACL 1000-0.9 UT/500ML-% IV SOLN
INTRAVENOUS | Status: DC | PRN
  Administered 2022-11-18 (×2): 500 mL

## 2022-11-18 MED ORDER — SODIUM CHLORIDE 0.9% FLUSH
3.0000 mL | Freq: Two times a day (BID) | INTRAVENOUS | Status: DC
  Administered 2022-11-19 – 2022-11-20 (×2): 3 mL via INTRAVENOUS

## 2022-11-18 MED ORDER — FENTANYL CITRATE (PF) 100 MCG/2ML IJ SOLN
INTRAMUSCULAR | Status: DC | PRN
  Administered 2022-11-18 (×2): 25 ug via INTRAVENOUS

## 2022-11-18 MED ORDER — CLOPIDOGREL BISULFATE 75 MG PO TABS
300.0000 mg | ORAL_TABLET | Freq: Once | ORAL | Status: AC
  Administered 2022-11-18: 300 mg via ORAL
  Filled 2022-11-18: qty 4

## 2022-11-18 MED ORDER — ACETAMINOPHEN 325 MG PO TABS: 650.0000 mg | ORAL_TABLET | ORAL | Status: DC | PRN

## 2022-11-18 MED ORDER — VERAPAMIL HCL 2.5 MG/ML IV SOLN
INTRAVENOUS | Status: DC | PRN
  Administered 2022-11-18: 10 mL via INTRA_ARTERIAL

## 2022-11-18 MED ORDER — LABETALOL HCL 5 MG/ML IV SOLN: 10.0000 mg | INTRAVENOUS | Status: AC | PRN

## 2022-11-18 MED ORDER — HEPARIN SODIUM (PORCINE) 1000 UNIT/ML IJ SOLN
INTRAMUSCULAR | Status: AC
  Filled 2022-11-18: qty 10

## 2022-11-18 MED ORDER — HYDRALAZINE HCL 20 MG/ML IJ SOLN: 10.0000 mg | INTRAMUSCULAR | Status: AC | PRN

## 2022-11-18 MED ORDER — LIDOCAINE HCL (PF) 1 % IJ SOLN
INTRAMUSCULAR | Status: DC | PRN
  Administered 2022-11-18: 2 mL

## 2022-11-18 MED ORDER — SODIUM CHLORIDE 0.9 % IV SOLN: INTRAVENOUS | Status: AC

## 2022-11-18 MED ORDER — MIDAZOLAM HCL 2 MG/2ML IJ SOLN
INTRAMUSCULAR | Status: DC | PRN
  Administered 2022-11-18 (×2): 1 mg via INTRAVENOUS

## 2022-11-18 MED ORDER — SODIUM CHLORIDE 0.9 % WEIGHT BASED INFUSION
3.0000 mL/kg/h | INTRAVENOUS | Status: DC
  Administered 2022-11-19: 3 mL/kg/h via INTRAVENOUS

## 2022-11-18 MED ORDER — CLOPIDOGREL BISULFATE 75 MG PO TABS: 300.0000 mg | ORAL_TABLET | Freq: Every day | ORAL | Status: DC

## 2022-11-18 MED ORDER — SODIUM CHLORIDE 0.9 % IV SOLN: 250.0000 mL | INTRAVENOUS | Status: DC | PRN

## 2022-11-18 MED ORDER — MIDAZOLAM HCL 2 MG/2ML IJ SOLN
INTRAMUSCULAR | Status: AC
  Filled 2022-11-18: qty 2

## 2022-11-18 MED ORDER — CLOPIDOGREL BISULFATE 75 MG PO TABS
75.0000 mg | ORAL_TABLET | Freq: Every day | ORAL | Status: DC
  Administered 2022-11-19 – 2022-11-20 (×2): 75 mg via ORAL
  Filled 2022-11-18 (×3): qty 1

## 2022-11-18 MED ORDER — HEPARIN SODIUM (PORCINE) 1000 UNIT/ML IJ SOLN
INTRAMUSCULAR | Status: DC | PRN
  Administered 2022-11-18: 3500 [IU] via INTRAVENOUS

## 2022-11-18 MED ORDER — SODIUM CHLORIDE 0.9% FLUSH: 3.0000 mL | INTRAVENOUS | Status: DC | PRN

## 2022-11-18 MED ORDER — FENTANYL CITRATE (PF) 100 MCG/2ML IJ SOLN
INTRAMUSCULAR | Status: AC
  Filled 2022-11-18: qty 2

## 2022-11-18 SURGICAL SUPPLY — 9 items
CATH 5FR JL3.5 JR4 ANG PIG MP (CATHETERS) IMPLANT
DEVICE RAD COMP TR BAND LRG (VASCULAR PRODUCTS) IMPLANT
GLIDESHEATH SLEND SS 6F .021 (SHEATH) IMPLANT
GUIDEWIRE INQWIRE 1.5J.035X260 (WIRE) IMPLANT
INQWIRE 1.5J .035X260CM (WIRE) ×1
KIT HEART LEFT (KITS) ×1 IMPLANT
PACK CARDIAC CATHETERIZATION (CUSTOM PROCEDURE TRAY) ×1 IMPLANT
TRANSDUCER W/STOPCOCK (MISCELLANEOUS) ×1 IMPLANT
TUBING CIL FLEX 10 FLL-RA (TUBING) ×1 IMPLANT

## 2022-11-18 NOTE — Plan of Care (Signed)
  Problem: Education: Goal: Knowledge of General Education information will improve Description Including pain rating scale, medication(s)/side effects and non-pharmacologic comfort measures Outcome: Progressing   

## 2022-11-18 NOTE — Interval H&P Note (Signed)
History and Physical Interval Note:  11/18/2022 10:20 AM  [name]  has presented today for surgery, with the diagnosis of unstable angina -> known severe left main disease.  The various methods of treatment have been discussed with the patient and family. After consideration of risks, benefits and other options for treatment, the patient has consented to  Procedure(s): LEFT HEART CATH AND CORONARY ANGIOGRAPHY (N/A) as a surgical intervention.  The patient's history has been reviewed, patient examined, no change in status, stable for surgery.  I have reviewed the patient's chart and labs.  Questions were answered to the patient's satisfaction.     Bryan Lemma

## 2022-11-18 NOTE — Assessment & Plan Note (Signed)
BMI >30 

## 2022-11-18 NOTE — Progress Notes (Addendum)
ANTICOAGULATION CONSULT NOTE   Pharmacy Consult:  Heparin Indication: chest pain/ACS  Allergies  Allergen Reactions   Celecoxib Itching and Other (See Comments)    Other Reaction(s): Not available   Citalopram Other (See Comments) and Palpitations    Heart rate increased   Morphine And Related Other (See Comments) and Itching    Patient states she is not allergic to morphine; states had morphine with no reaction   Ciprofloxacin Hcl Other (See Comments)    Mya have made her sick or bad diarrhea   Dicyclomine Diarrhea and Other (See Comments)    Made diarrhea worse   Lamotrigine Other (See Comments) and Diarrhea    Unknown   Metoclopramide Other (See Comments)    Dizziness   Milnacipran Hcl Other (See Comments)    Unknown   Omeprazole Other (See Comments)   Pregabalin Other (See Comments)   Serotonin Reuptake Inhibitors (Ssris) Other (See Comments)   Simvastatin Other (See Comments)    Increased back and muscle pain   Tizanidine Other (See Comments)    unknown   Trazodone And Nefazodone Other (See Comments)    hallucinations   Venlafaxine Other (See Comments)    Affected eyes, blurred vision   Codeine Itching   Morphine Other (See Comments)   Nefazodone Other (See Comments)   Tape Other (See Comments)    Skin gets red and skin pulls off, paper tape only   Sertraline Hcl Other (See Comments)    Stomach upset. Pt takes this med at home 11/2022    Patient Measurements: Height: 4\' 11"  (149.9 cm) Weight: 68 kg (150 lb) IBW/kg (Calculated) : 43.2 Heparin Dosing Weight: 60 kg  Vital Signs:    Labs: Recent Labs    11/16/22 1645 11/16/22 1813 11/16/22 1841 11/17/22 0223 11/17/22 0955 11/18/22 0245 11/18/22 0815  HGB 14.6  --   --  12.8  --  13.1  --   HCT 43.1  --   --  38.9  --  38.4  --   PLT 156  --   --  145*  --  137*  --   APTT  --  29  --   --   --   --   --   LABPROT  --  13.2  --   --   --   --   --   INR  --  1.0  --   --   --   --   --   HEPARINUNFRC   --   --   --  0.39 0.38  --  0.69  CREATININE 1.09*  --   --  1.11*  --  1.36*  --   TROPONINIHS 132*  --  129*  --   --   --   --      Estimated Creatinine Clearance: 30.9 mL/min (A) (by C-G formula based on SCr of 1.36 mg/dL (H)).   Medical History: Past Medical History:  Diagnosis Date   Abnormal chest CT    Anxiety    Arthritis    Rhumatoid and Osteoarthritis   Carotid artery disease (HCC)    Dr. Myra Gianotti, s/p left CEA, severe R ICA stenosis   Coronary atherosclerosis of native coronary artery    DES RCA/circumflex 4/10, LVEF 65 -> repeat cath 11/2021 with multivessel CAD including LM stenosis, initial med rx   Depression    Essential hypertension    Fibromyalgia    GERD (gastroesophageal reflux disease)    Hiatal hernia  Hyperlipidemia    Kidney cysts    NSTEMI (non-ST elevated myocardial infarction) (HCC)    4/10   Renal artery stenosis (HCC)    Left renal artery stent 12/13   Restless leg syndrome    Spondylosis without myelopathy    Tobacco abuse     Assessment: [name] presented with chest pain and anxiety.  Troponin elevated at 132.  Pharmacy consulted to dose IV heparin for ACS.   Heparin level came back therapeutic at 0.69 (on higher end of goal range), on 750 units/hr. Hgb 13.1, plt 137. No s/sx of bleeding or infusion issues.   Goal of Therapy:  Heparin level 0.3-0.7 units/ml Monitor platelets by anticoagulation protocol: Yes   Plan:  Reduce heparin infusion to 700 units/hr Monitor daily HL, CBC, and for s/sx of bleeding  F/u after cath   Thank you for allowing pharmacy to participate in this patient's care,  Sherron Monday, PharmD, BCCCP Clinical Pharmacist  Phone: [phone] 11/18/2022 9:02 AM  Please check AMION for all [facility] Pharmacy phone numbers After 10:00 PM, call Main Pharmacy [phone]  [facility] Cath showing stable calcified left main stenosis - plan for cath tomorrow. Loading with plavix now. Plan to restart heparin infusion 2 hours after  TR band removed (documented on 5/7@1300 ). Will restart heparin infusion at 700 units/hr on 5/7@1500 . Monitor daily HL, CBC, and for s/sx of bleeding.  Sherron Monday, PharmD, BCCCP Clinical Pharmacist

## 2022-11-18 NOTE — Progress Notes (Signed)
  Progress Note   Patient: [name] VOZ:[id number] DOB: [birth year] DOA: 11/16/2022     2 DOS: the patient was seen and examined on 11/18/2022 pre-cath at 9:04AM      Brief hospital course: [name] is a 72 y.o. 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  In the ER, troponins elevated   5/5: Admitted, started on heparin and transferred to [facility] 5/6: Cardiology evaluated patient, recommended LHC 5/7: LHC unfortunately shows 90% ostial to mid left main stenosis     Assessment and Plan: * Progressive angina with severe left main stenosis Patient with known left main disease p/w accelerating chest discomfort, associated with nausea and diaphoresis.  Known LM disease not amenable to CABG.  Unfortunately, LHC today shows progressed disease.  NSTEMI ruled out. - Continue heparin gtt - Continue aspirin - Add Plavix - Continue Crestor, Imdur, metoprolol -Consult cardiology, appreciate expertise     Chronic kidney disease stage IIIb Baseline since last July appears to be 1.1-1.5 - Trend Cr post-cath  Hypothyroidism - Continue levothyroxine  Fibromyalgia - Continue duloxetine and sertraline  Rheumatoid arthritis (HCC) Old diagnosis, seems quiescent.  Saw Rheum a long time ago, not currently on DMARD.  IPF (idiopathic pulmonary fibrosis) (HCC) HRCT in July 2023 with some UIP, probably RA related.    PAD (peripheral artery disease) (HCC)    CAD (coronary artery disease) See above  Essential hypertension BP normal this morning. - Continue amlodipine, Imdur, metoprolol  Hyperlipidemia - Continue Crestor 40 - PCSK9 per cardiology          Subjective: Patient with nausea, would like ginger ale.  No fever, no cough, no dyspnea.  No confusion.  To cath  today.     Physical Exam: BP (!) 96/32   Pulse (!) 59   Temp (!) 97.4 F (36.3 C) (Oral)   Resp 18   Ht 4\' 11"  (1.499 m)   Wt 68 kg   SpO2 91%   BMI 30.30 kg/m   Elderly adult female, ambulating in the room without assistance, appears uncomfortable but oriented and no acute distress RRR, appreciated murmurs, no JVD Respiratory rate seems normal, lungs clear without rales or wheezes Attention normal, affect pained, judgment and insight appear normal    Data Reviewed: Heart cath report reviewed Basic metabolic panel shows a stable creatinine  Family Communication: None present    Disposition: Status is: Inpatient The patient was admitted with progressive angina  This is from left main disease, which is difficult to treat as she is not a CABG candidate  Cardiology plan for PCI tomorrow  Ultimate disposition per cardiology        Author: Alberteen Sam, MD 11/18/2022 2:15 PM  For on call review [email or web address].

## 2022-11-18 NOTE — Assessment & Plan Note (Addendum)
Baseline since last July appears to be 1.1-1.5 - Trend Cr post-cath

## 2022-11-18 NOTE — Progress Notes (Addendum)
Rounding Note    Patient Name: [name] Date of Encounter: 11/18/2022  [facility] Cardiologist: Nona Dell, MD   Subjective   Patient seen after LHC with Dr. Herbie Baltimore today. A little sleepy following procedural fentanyl. Denies chest pain or dyspnea this morning except during her heart cath.   Inpatient Medications    Scheduled Meds:  alum & mag hydroxide-simeth  30 mL Oral Once   amLODipine  5 mg Oral Daily   aspirin EC  81 mg Oral Daily   DULoxetine  20 mg Oral QHS   isosorbide mononitrate  60 mg Oral Daily   levothyroxine  25 mcg Oral Q0600   metoprolol succinate  100 mg Oral Daily   pantoprazole  40 mg Oral Daily   rosuvastatin  40 mg Oral QHS   sertraline  50 mg Oral Daily   sodium chloride flush  3 mL Intravenous Q12H   Continuous Infusions:  sodium chloride     sodium chloride 1 mL/kg/hr (11/18/22 0540)   heparin 750 Units/hr (11/17/22 1956)   PRN Meds: sodium chloride, ALPRAZolam, fentaNYL (SUBLIMAZE) injection, naLOXone (NARCAN)  injection, nitroGLYCERIN, ondansetron **OR** ondansetron (ZOFRAN) IV, oxyCODONE-acetaminophen, sodium chloride flush   Vital Signs    Vitals:   11/17/22 0408 11/17/22 0839 11/17/22 1228 11/17/22 1948  BP: (!) 122/43 (!) 141/57 (!) 133/50 (!) 141/37  Pulse: 70 70 71 64  Resp: 20 19 18 18   Temp: 97.8 F (36.6 C) 97.9 F (36.6 C) 98.3 F (36.8 C) 97.9 F (36.6 C)  TempSrc: Oral Oral Oral Oral  SpO2: 96%   97%  Weight:      Height:       No intake or output data in the 24 hours ending 11/18/22 0944    11/16/2022    4:40 PM 10/06/2022   11:05 AM 09/04/2022    2:28 PM  Last 3 Weights  Weight (lbs) 150 lb 162 lb 157 lb  Weight (kg) 68.04 kg 73.483 kg 71.215 kg      Telemetry    Sinus rhythm - Personally Reviewed  ECG    No new tracing - Personally Reviewed  Physical Exam   GEN: No acute distress.   Neck: No JVD Cardiac: RRR, no murmurs, rubs, or gallops.  Respiratory: Clear to auscultation  bilaterally. GI: Soft, nontender, non-distended  MS: No edema; No deformity. Right radial TR band in place Neuro:  Nonfocal  Psych: Normal affect   Labs    High Sensitivity Troponin:   Recent Labs  Lab 11/16/22 1645 11/16/22 1841  TROPONINIHS 132* 129*     Chemistry Recent Labs  Lab 11/16/22 1645 11/17/22 0223 11/18/22 0245  NA 135 135 137  K 4.0 3.3* 3.9  CL 101 102 105  CO2 23 25 22   GLUCOSE 111* 102* 129*  BUN 18 15 22   CREATININE 1.09* 1.11* 1.36*  CALCIUM 9.1 8.6* 8.7*  PROT  --  6.3*  --   ALBUMIN  --  3.5  --   AST  --  17  --   ALT  --  13  --   ALKPHOS  --  64  --   BILITOT  --  0.5  --   GFRNONAA 54* 52* 41*  ANIONGAP 11 8 10     Lipids  Recent Labs  Lab 11/18/22 0245  CHOL 124  TRIG 149  HDL 50  LDLCALC 44  CHOLHDL 2.5    Hematology Recent Labs  Lab 11/16/22 1645 11/17/22 0223  11/18/22 0245  WBC 8.0 6.9 5.4  RBC 4.56 4.07 4.07  HGB 14.6 12.8 13.1  HCT 43.1 38.9 38.4  MCV 94.5 95.6 94.3  MCH 32.0 31.4 32.2  MCHC 33.9 32.9 34.1  RDW 13.1 13.2 13.1  PLT 156 145* 137*   Thyroid No results for input(s): "TSH", "FREET4" in the last 168 hours.  BNPNo results for input(s): "BNP", "PROBNP" in the last 168 hours.  DDimer No results for input(s): "DDIMER" in the last 168 hours.   Radiology    ECHOCARDIOGRAM COMPLETE  Result Date: 11/17/2022    ECHOCARDIOGRAM REPORT   Patient Name:   [name] Date of Exam: 11/17/2022 Medical Rec #:  [id number]      Height:       59.0 in Accession #:    [id number]     Weight:       150.0 lb Date of Birth:  1951-03-21      BSA:          1.632 m Patient Age:    71 years       BP:           133/50 mmHg Patient Gender: F              HR:           65 bpm. Exam Location:  Inpatient Procedure: 2D Echo, Cardiac Doppler and Color Doppler Indications:    Chest Pain  History:        Patient has prior history of Echocardiogram examinations, most                 recent 09/19/2021. CAD, Signs/Symptoms:Shortness of Breath; Risk                  Factors:Hypertension.  Sonographer:    Raeford Razor Sonographer#2:  Milbert Coulter Referring Phys: Yvonna Alanis, L  Sonographer Comments: Image acquisition challenging due to patient body habitus. IMPRESSIONS  1. Left ventricular ejection fraction, by estimation, is 55 to 60%. The left ventricle has normal function. The left ventricle has no regional wall motion abnormalities. There is mild concentric left ventricular hypertrophy. Left ventricular diastolic parameters are consistent with Grade I diastolic dysfunction (impaired relaxation).  2. Right ventricular systolic function is normal. The right ventricular size is normal. There is normal pulmonary artery systolic pressure. The estimated right ventricular systolic pressure is 19.2 mmHg.  3. The mitral valve is normal in structure. Trivial mitral valve regurgitation. No evidence of mitral stenosis.  4. The aortic valve is tricuspid. There is mild calcification of the aortic valve. Aortic valve regurgitation is not visualized. No aortic stenosis is present.  5. The inferior vena cava is normal in size with greater than 50% respiratory variability, suggesting right atrial pressure of 3 mmHg. FINDINGS  Left Ventricle: Left ventricular ejection fraction, by estimation, is 55 to 60%. The left ventricle has normal function. The left ventricle has no regional wall motion abnormalities. The left ventricular internal cavity size was normal in size. There is  mild concentric left ventricular hypertrophy. Left ventricular diastolic parameters are consistent with Grade I diastolic dysfunction (impaired relaxation). Right Ventricle: The right ventricular size is normal. No increase in right ventricular wall thickness. Right ventricular systolic function is normal. There is normal pulmonary artery systolic pressure. The tricuspid regurgitant velocity is 2.01 m/s, and  with an assumed right atrial pressure of 3 mmHg, the estimated right ventricular systolic pressure  is 19.2 mmHg. Left Atrium: Left atrial size was  normal in size. Right Atrium: Right atrial size was normal in size. Pericardium: Trivial pericardial effusion is present. Mitral Valve: The mitral valve is normal in structure. There is mild calcification of the mitral valve leaflet(s). Mild mitral annular calcification. Trivial mitral valve regurgitation. No evidence of mitral valve stenosis. Tricuspid Valve: The tricuspid valve is normal in structure. Tricuspid valve regurgitation is trivial. Aortic Valve: The aortic valve is tricuspid. There is mild calcification of the aortic valve. Aortic valve regurgitation is not visualized. No aortic stenosis is present. Aortic valve peak gradient measures 5.8 mmHg. Pulmonic Valve: The pulmonic valve was normal in structure. Pulmonic valve regurgitation is not visualized. Aorta: The aortic root is normal in size and structure. Venous: The inferior vena cava is normal in size with greater than 50% respiratory variability, suggesting right atrial pressure of 3 mmHg. IAS/Shunts: No atrial level shunt detected by color flow Doppler.  LEFT VENTRICLE PLAX 2D LVIDd:         3.80 cm     Diastology LVIDs:         2.70 cm     LV e' medial:    6.74 cm/s LV PW:         1.20 cm     LV E/e' medial:  12.2 LV IVS:        1.10 cm     LV e' lateral:   6.85 cm/s LVOT diam:     1.70 cm     LV E/e' lateral: 12.0 LV SV:         52 LV SV Index:   32 LVOT Area:     2.27 cm  LV Volumes (MOD) LV vol d, MOD A4C: 71.7 ml LV vol s, MOD A4C: 30.2 ml LV SV MOD A4C:     71.7 ml RIGHT VENTRICLE             IVC RV Basal diam:  2.40 cm     IVC diam: 1.40 cm RV S prime:     11.20 cm/s TAPSE (M-mode): 2.9 cm LEFT ATRIUM             Index        RIGHT ATRIUM           Index LA diam:        4.30 cm 2.63 cm/m   RA Area:     15.40 cm LA Vol (A2C):   35.2 ml 21.57 ml/m  RA Volume:   42.10 ml  25.79 ml/m LA Vol (A4C):   28.7 ml 17.58 ml/m LA Biplane Vol: 32.6 ml 19.97 ml/m  AORTIC VALVE AV Area (Vmax): 1.69 cm  AV Vmax:        120.00 cm/s AV Peak Grad:   5.8 mmHg LVOT Vmax:      89.60 cm/s LVOT Vmean:     62.400 cm/s LVOT VTI:       0.227 m  AORTA Ao Root diam: 2.60 cm Ao Asc diam:  2.60 cm MITRAL VALVE               TRICUSPID VALVE MV Area (PHT): 2.95 cm    TR Peak grad:   16.2 mmHg MV Decel Time: 257 msec    TR Vmax:        201.00 cm/s MV E velocity: 82.30 cm/s MV A velocity: 91.70 cm/s  SHUNTS MV E/A ratio:  0.90        Systemic VTI:  0.23 m  Systemic Diam: 1.70 cm Dalton McleanMD Electronically signed by Wilfred Lacy Signature Date/Time: 11/17/2022/4:26:42 PM    Final    DG Chest Port 1 View  Result Date: 11/16/2022 CLINICAL DATA:  Chest pain. EXAM: PORTABLE CHEST 1 VIEW COMPARISON:  CT chest dated January 13, 2022 FINDINGS: The heart is enlarged on this AP projection. Prominent aortic atherosclerotic calcifications. Lungs are clear without evidence of focal consolidation or pleural effusion. Partially imaged anterior cervical discectomy and fusion hardware. No acute osseous abnormality. IMPRESSION: Cardiomegaly. No acute cardiopulmonary process. Electronically Signed   By: Larose Hires D.O.   On: 11/16/2022 17:18    Cardiac Studies   Left heart catheterization 11/13/2021   Ost LM to Mid LM lesion is 90% stenosed.   Mid Cx lesion is 20% stenosed.   Mid Cx to Dist Cx lesion is 70% stenosed.   Prox Cx lesion is 60% stenosed.   Ost RCA to Prox RCA lesion is 20% stenosed.   Prox RCA to Mid RCA lesion is 30% stenosed.   RPDA lesion is 90% stenosed.   Prox RCA lesion is 60% stenosed.   RPAV lesion is 60% stenosed.   1.  Severe calcified ostial left main stenosis with significant pressure dampening with catheter engagement.  Patent left circumflex disease with mild in-stent restenosis and progression of native vessel disease in the left circumflex.  Patent RCA stents with diffuse disease in multiple areas with subtotal occlusion of the right PDA with well-developed left-to-right  collaterals. 2.  Left ventricular angiography was not performed.  EF was normal by echo.  Severely elevated blood pressure with moderately elevated left ventricular end-diastolic pressure and no significant gradient across the aortic valve.   Recommendations: Due to severe left main disease and patient's symptoms, recommend hospital admission for CABG evaluation.  The RCA might not have good targets for CABG but the right PDA has excellent collaterals from the left.   Echocardiogram 09/19/2021 1. Left ventricular ejection fraction, by estimation, is 60 to 65%. The  left ventricle has normal function. The left ventricle has no regional  wall motion abnormalities. Left ventricular diastolic parameters are  consistent with Grade I diastolic  dysfunction (impaired relaxation). Elevated left atrial pressure.   2. Right ventricular systolic function is normal. The right ventricular  size is normal.   3. The mitral valve is abnormal. Mild mitral valve regurgitation. No  evidence of mitral stenosis.   4. The aortic valve is tricuspid. There is mild calcification of the  aortic valve. There is mild thickening of the aortic valve. Aortic valve  regurgitation is not visualized. No aortic stenosis is present.  11/18/22 LHC    Ost LM to Mid LM lesion is 90% stenosed.   Mid Cx lesion is 20% stenosed.   Mid Cx to Dist Cx lesion is 70% stenosed.  Prox Cx lesion is 60% stenosed.   Ost RCA to Prox RCA lesion is 20% stenosed.   Prox RCA to Mid RCA lesion is 30% stenosed.   Prox RCA lesion is 60% stenosed.   RPDA lesion is 100% stenosed. fills via L-R collaterals   RPAV lesion is 60% stenosed.   LV end diastolic pressure is moderately elevated.   There is no aortic valve stenosis.   POST-CATH DIAGNOSES Relatively stable elliptical calcified 90% Left Main stenosis with significant catheter dampening during engagement pressures normalized the pressures in the 80s with significant anginal chest  pain Progression of proximal RCA disease but otherwise stable disease elsewhere. Significant pressure gradient  in the innominate this up to right subclavian artery with tandem lesions leading to a 40 to 50 mmHg gradient.   RECOMMENDATIONS Will discuss with ICU team determine best course of action, would likely plan unsupported left main PCI with either atherectomy versus shockwave lithotripsy and IVUS guidance. Would likely load with Plavix once the plan for PCI is made.  Patient Profile     [name] is a 72 y.o. 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 Maryfrances Bunnell.   Assessment & Plan    Chest pain CAD with severe multivessel disease (L main 90%, Lcx 70%, PDA 90% and prior history of DES to RCA and Lcx)  Patient presented with progressive angina, dyspnea with associated weakness, diaphoresis, numbness, and tingling. EKG without acute ischemia. Troponin 132->129. Patient previously seen by [facility] in 2023 and deemed not a surgical candidate due to extensive aortic root/arch calcifications. Off-pump LIMA to LAD was also not an option due to subclavian stenosis Given high risk of PCI, medical management recommended at that time.   LHC today showed Ost LM to Mid LM lesion is 90% stenosed. Per Dr. Eldridge Dace, will plan for left main PCI with Dr. Kirke Corin tomorrow. Continue ASA 81mg . Will discuss P2Y12 loading with Dr. Eldridge Dace. Continue Metoprolol Succinate 50mg  Imdur increased to 60mg  yesterday, continue Continue heparin  Hypertension  BP elevated this admission. Will plan to increase Amlodipine if BP remains elevated post-cath. Continue metoprolol succinate 50mg .  Hyperlipidemia  Unclear whether patient is tolerating Crestor, will  need to discuss further with patient and strongly consider PCSK9 if ongoing statin use deemed problematic.       For questions or updates, please contact [facility] Please consult [email or web address] for contact info under        Signed, Perlie Gold, PA-C  11/18/2022, 9:44 AM    I have examined the patient and reviewed assessment and plan and discussed with patient.  Agree with above as stated.    I personally reviewed the cath films from today and from last year.  She has had worsening of her anginal symptoms.  Echocardiogram showed normal LV function yesterday.  Discussed the case with the interventional team.  Will plan high risk intervention to the ostial left main as the patient is failing medical management.  Her lifestyle is significantly limited by angina, now even with just small activities.  I spoke at length with the patient regarding the severe calcification of her ostial left main.  We discussed atherectomy and intravascular lithotripsy as possible ways to modify the calcium so that a stent could be properly deployed.  At least 1 of these methods if not both will likely be used.  All questions were answered.  She is in agreement with proceeding as her symptoms are quite limiting.  Will load with clopidogrel 300 mg p.o. x 1.  She will then follow this with clopidogrel 75 mg daily.  Dr. Kirke Corin will perform the intervention tomorrow.    Lance Muss

## 2022-11-19 ENCOUNTER — Inpatient Hospital Stay (HOSPITAL_COMMUNITY)
Admission: EM | Disposition: A | Discharge status: Home / Self Care | Payer: Self-pay | Source: Home / Self Care | Attending: Family Medicine

## 2022-11-19 DIAGNOSIS — I200 Unstable angina: Secondary | ICD-10-CM

## 2022-11-19 DIAGNOSIS — E782 Mixed hyperlipidemia: Secondary | ICD-10-CM | POA: Diagnosis not present

## 2022-11-19 DIAGNOSIS — I25110 Atherosclerotic heart disease of native coronary artery with unstable angina pectoris: Secondary | ICD-10-CM | POA: Diagnosis not present

## 2022-11-19 DIAGNOSIS — I10 Essential (primary) hypertension: Secondary | ICD-10-CM | POA: Diagnosis not present

## 2022-11-19 HISTORY — PX: CORONARY STENT INTERVENTION: CATH118234

## 2022-11-19 HISTORY — PX: TEMPORARY PACEMAKER: CATH118268

## 2022-11-19 HISTORY — PX: CORONARY ATHERECTOMY: CATH118238

## 2022-11-19 HISTORY — PX: CORONARY ULTRASOUND/IVUS: CATH118244

## 2022-11-19 LAB — CBC
HCT: 36.5 % (ref 36.0–46.0)
HCT: 38.2 % (ref 36.0–46.0)
Hemoglobin: 12.2 g/dL (ref 12.0–15.0)
Hemoglobin: 12.8 g/dL (ref 12.0–15.0)
MCH: 31.9 pg (ref 26.0–34.0)
MCH: 31.8 pg (ref 26.0–34.0)
MCHC: 33.4 g/dL (ref 30.0–36.0)
MCHC: 33.5 g/dL (ref 30.0–36.0)
MCV: 95.3 fL (ref 80.0–100.0)
MCV: 94.8 fL (ref 80.0–100.0)
Platelets: 129 10*3/uL — ABNORMAL LOW (ref 150–400)
Platelets: 143 10*3/uL — ABNORMAL LOW (ref 150–400)
RBC: 3.83 MIL/uL — ABNORMAL LOW (ref 3.87–5.11)
RBC: 4.03 MIL/uL (ref 3.87–5.11)
RDW: 13.1 % (ref 11.5–15.5)
RDW: 13 % (ref 11.5–15.5)
WBC: 5.1 10*3/uL (ref 4.0–10.5)
WBC: 4.5 10*3/uL (ref 4.0–10.5)
nRBC: 0 % (ref 0.0–0.2)
nRBC: 0 % (ref 0.0–0.2)

## 2022-11-19 LAB — BASIC METABOLIC PANEL
Anion gap: 7 (ref 5–15)
BUN: 23 mg/dL (ref 8–23)
CO2: 21 mmol/L — ABNORMAL LOW (ref 22–32)
Calcium: 8.4 mg/dL — ABNORMAL LOW (ref 8.9–10.3)
Chloride: 108 mmol/L (ref 98–111)
Creatinine, Ser: 1.28 mg/dL — ABNORMAL HIGH (ref 0.44–1.00)
GFR, Estimated: 44 mL/min — ABNORMAL LOW (ref >60)
Glucose, Bld: 113 mg/dL — ABNORMAL HIGH (ref 70–99)
Potassium: 3.8 mmol/L (ref 3.5–5.1)
Sodium: 136 mmol/L (ref 135–145)

## 2022-11-19 LAB — CREATININE, SERUM
Creatinine, Ser: 1.13 mg/dL — ABNORMAL HIGH (ref 0.44–1.00)
GFR, Estimated: 51 mL/min — ABNORMAL LOW (ref >60)

## 2022-11-19 LAB — POCT ACTIVATED CLOTTING TIME
Activated Clotting Time: 531 seconds
Activated Clotting Time: 266 seconds
Activated Clotting Time: 374 seconds
Activated Clotting Time: 271 seconds

## 2022-11-19 LAB — HEPARIN LEVEL (UNFRACTIONATED): Heparin Unfractionated: 0.43 IU/mL (ref 0.30–0.70)

## 2022-11-19 SURGERY — CORONARY STENT INTERVENTION
Anesthesia: LOCAL

## 2022-11-19 MED ORDER — VERAPAMIL HCL 2.5 MG/ML IV SOLN
INTRAVENOUS | Status: AC
  Filled 2022-11-19: qty 4

## 2022-11-19 MED ORDER — CLOPIDOGREL BISULFATE 300 MG PO TABS
ORAL_TABLET | ORAL | Status: DC | PRN
  Administered 2022-11-19: 300 mg via ORAL

## 2022-11-19 MED ORDER — VERAPAMIL HCL 2.5 MG/ML IV SOLN
INTRAVENOUS | Status: AC
  Filled 2022-11-19: qty 2

## 2022-11-19 MED ORDER — NITROGLYCERIN 1 MG/10 ML FOR IR/CATH LAB
INTRA_ARTERIAL | Status: AC
  Filled 2022-11-19: qty 20

## 2022-11-19 MED ORDER — FENTANYL CITRATE (PF) 100 MCG/2ML IJ SOLN
INTRAMUSCULAR | Status: DC | PRN
  Administered 2022-11-19 (×3): 50 ug via INTRAVENOUS

## 2022-11-19 MED ORDER — CLOPIDOGREL BISULFATE 300 MG PO TABS
ORAL_TABLET | ORAL | Status: AC
  Filled 2022-11-19: qty 1

## 2022-11-19 MED ORDER — FENTANYL CITRATE (PF) 100 MCG/2ML IJ SOLN
INTRAMUSCULAR | Status: AC
  Filled 2022-11-19: qty 2

## 2022-11-19 MED ORDER — NITROGLYCERIN IN D5W 200-5 MCG/ML-% IV SOLN
INTRAVENOUS | Status: AC
  Filled 2022-11-19: qty 250

## 2022-11-19 MED ORDER — VERAPAMIL HCL 2.5 MG/ML IV SOLN: TOPICAL | Status: DC | PRN

## 2022-11-19 MED ORDER — SODIUM CHLORIDE 0.9 % IV SOLN
INTRAVENOUS | Status: AC | PRN
  Administered 2022-11-19: 10 mL/h via INTRAVENOUS

## 2022-11-19 MED ORDER — HEPARIN (PORCINE) IN NACL 1000-0.9 UT/500ML-% IV SOLN
INTRAVENOUS | Status: DC | PRN
  Administered 2022-11-19 (×4): 500 mL

## 2022-11-19 MED ORDER — IOHEXOL 350 MG/ML SOLN
INTRAVENOUS | Status: DC | PRN
  Administered 2022-11-19: 165 mL

## 2022-11-19 MED ORDER — FAMOTIDINE IN NACL 20-0.9 MG/50ML-% IV SOLN
20.0000 mg | Freq: Once | INTRAVENOUS | Status: AC
  Administered 2022-11-19: 20 mg via INTRAVENOUS

## 2022-11-19 MED ORDER — LIDOCAINE HCL (PF) 1 % IJ SOLN
INTRAMUSCULAR | Status: DC | PRN
  Administered 2022-11-19: 15 mL

## 2022-11-19 MED ORDER — LIDOCAINE HCL (PF) 1 % IJ SOLN
INTRAMUSCULAR | Status: AC
  Filled 2022-11-19: qty 30

## 2022-11-19 MED ORDER — NITROGLYCERIN 1 MG/10 ML FOR IR/CATH LAB
INTRA_ARTERIAL | Status: AC
  Filled 2022-11-19: qty 10

## 2022-11-19 MED ORDER — NITROGLYCERIN IN D5W 200-5 MCG/ML-% IV SOLN
INTRAVENOUS | Status: AC | PRN
  Administered 2022-11-19: 10 ug/min via INTRAVENOUS

## 2022-11-19 MED ORDER — SODIUM CHLORIDE 0.9 % WEIGHT BASED INFUSION: 1.0000 mL/kg/h | INTRAVENOUS | Status: AC

## 2022-11-19 MED ORDER — HEPARIN SODIUM (PORCINE) 1000 UNIT/ML IJ SOLN
INTRAMUSCULAR | Status: DC | PRN
  Administered 2022-11-19: 7000 [IU] via INTRAVENOUS
  Administered 2022-11-19: 2000 [IU] via INTRAVENOUS

## 2022-11-19 MED ORDER — FAMOTIDINE IN NACL 20-0.9 MG/50ML-% IV SOLN
INTRAVENOUS | Status: AC
  Filled 2022-11-19: qty 50

## 2022-11-19 MED ORDER — HEPARIN SODIUM (PORCINE) 1000 UNIT/ML IJ SOLN
INTRAMUSCULAR | Status: AC
  Filled 2022-11-19: qty 10

## 2022-11-19 MED ORDER — AMLODIPINE BESYLATE 10 MG PO TABS
10.0000 mg | ORAL_TABLET | Freq: Every day | ORAL | Status: DC
  Administered 2022-11-19 – 2022-11-20 (×2): 10 mg via ORAL
  Filled 2022-11-19 (×2): qty 1

## 2022-11-19 MED ORDER — ENOXAPARIN SODIUM 40 MG/0.4ML IJ SOSY
40.0000 mg | PREFILLED_SYRINGE | INTRAMUSCULAR | Status: DC
  Administered 2022-11-20: 40 mg via SUBCUTANEOUS
  Filled 2022-11-19: qty 0.4

## 2022-11-19 MED ORDER — SODIUM CHLORIDE 0.9% FLUSH
3.0000 mL | Freq: Two times a day (BID) | INTRAVENOUS | Status: DC
  Administered 2022-11-20: 3 mL via INTRAVENOUS

## 2022-11-19 MED ORDER — MIDAZOLAM HCL 2 MG/2ML IJ SOLN
INTRAMUSCULAR | Status: DC | PRN
  Administered 2022-11-19 (×2): 1 mg via INTRAVENOUS

## 2022-11-19 MED ORDER — MIDAZOLAM HCL 2 MG/2ML IJ SOLN
INTRAMUSCULAR | Status: AC
  Filled 2022-11-19: qty 2

## 2022-11-19 MED ORDER — NITROGLYCERIN IN D5W 200-5 MCG/ML-% IV SOLN: 0.0000 ug/min | INTRAVENOUS | Status: DC

## 2022-11-19 MED ORDER — SODIUM CHLORIDE 0.9 % IV SOLN: 250.0000 mL | INTRAVENOUS | Status: DC | PRN

## 2022-11-19 MED ORDER — SODIUM CHLORIDE 0.9% FLUSH: 3.0000 mL | INTRAVENOUS | Status: DC | PRN

## 2022-11-19 SURGICAL SUPPLY — 30 items
BALL SAPPHIRE NC24 3.0X12 (BALLOONS) ×2
BALL SAPPHIRE NC24 3.5X8 (BALLOONS) ×1
BALL SAPPHIRE NC24 3.75X8 (BALLOONS) ×1
BALLOON SAPPHIRE NC24 3.0X12 (BALLOONS) IMPLANT
BALLOON SAPPHIRE NC24 3.5X8 (BALLOONS) IMPLANT
BALLOON SAPPHIRE NC24 3.75X8 (BALLOONS) IMPLANT
BUR ROTAPRO CNCT ADVNCR 1.5 (BURR) IMPLANT
BURR ROTAPRO CNCT ADVNCR 1.5 (BURR) ×1
CABLE ADAPT PACING TEMP 12FT (ADAPTER) IMPLANT
CATH OPTICROSS HD (CATHETERS) IMPLANT
CATH S G BIP PACING (CATHETERS) IMPLANT
CATH VISTA GUIDE 7FR XB3.5 SH (CATHETERS) IMPLANT
CATH VISTA GUIDE 7FRXBLAD3.5SH (CATHETERS) IMPLANT
DEVICE CLOSURE MYNXGRIP 6/7F (Vascular Products) IMPLANT
ELECT DEFIB PAD ADLT CADENCE (PAD) IMPLANT
KIT ENCORE 26 ADVANTAGE (KITS) IMPLANT
KIT HEART LEFT (KITS) ×1 IMPLANT
KIT MICROPUNCTURE NIT STIFF (SHEATH) IMPLANT
LUBRICANT ROTAGLIDE 20CC VIAL (MISCELLANEOUS) IMPLANT
MAT PREVALON FULL STRYKER (MISCELLANEOUS) IMPLANT
PACK CARDIAC CATHETERIZATION (CUSTOM PROCEDURE TRAY) ×1 IMPLANT
SHEATH PINNACLE 6F 10CM (SHEATH) IMPLANT
SHEATH PINNACLE 7F 10CM (SHEATH) IMPLANT
SHEATH PROBE COVER 6X72 (BAG) IMPLANT
SLED PULL BACK IVUS (MISCELLANEOUS) IMPLANT
STENT MEGATRON 3.5X8 (Permanent Stent) IMPLANT
TRANSDUCER W/STOPCOCK (MISCELLANEOUS) ×1 IMPLANT
TUBING CIL FLEX 10 FLL-RA (TUBING) ×1 IMPLANT
WIRE EMERALD 3MM-J .035X150CM (WIRE) IMPLANT
WIRE RUNTHROUGH .014X180CM (WIRE) IMPLANT

## 2022-11-19 NOTE — Plan of Care (Signed)
  Problem: Education: Goal: Knowledge of General Education information will improve Description Including pain rating scale, medication(s)/side effects and non-pharmacologic comfort measures Outcome: Progressing   

## 2022-11-19 NOTE — Progress Notes (Signed)
Site area: Right groin a 6 french venous sheath was removed  Site Prior to Removal:  Level 0  Pressure Applied For 20 MINUTES    Minutes Beginning at 1835pm X 2 hours  Manual:   Yes.    Patient Status During Pull:  stable  Post Pull Groin Site:  Level 0  Post Pull Instructions Given:  Yes.    Post Pull Pulses Present:  Yes.    Dressing Applied:  Yes.    Comments:

## 2022-11-19 NOTE — Interval H&P Note (Signed)
History and Physical Interval Note:  11/19/2022 2:33 PM  [name]  has presented today for surgery, with the diagnosis of cad.  The various methods of treatment have been discussed with the patient and family. After consideration of risks, benefits and other options for treatment, the patient has consented to  Procedure(s): CORONARY STENT INTERVENTION (N/A) CORONARY ATHERECTOMY (N/A) Coronary Ultrasound/IVUS (N/A) TEMPORARY PACEMAKER (N/A) as a surgical intervention.  The patient's history has been reviewed, patient examined, no change in status, stable for surgery.  I have reviewed the patient's chart and labs.  Questions were answered to the patient's satisfaction.     Lorine Bears

## 2022-11-19 NOTE — Care Management Important Message (Signed)
Important Message  Patient Details  Name: [name] MRN: [id number] Date of Birth: 1951-07-01   Medicare Important Message Given:  Yes     Renie Ora 11/19/2022, 9:25 AM

## 2022-11-19 NOTE — Progress Notes (Signed)
ANTICOAGULATION CONSULT NOTE   Pharmacy Consult:  Heparin Indication: chest pain/ACS  Allergies  Allergen Reactions   Celecoxib Itching and Other (See Comments)    Other Reaction(s): Not available   Citalopram Other (See Comments) and Palpitations    Heart rate increased   Morphine And Related Other (See Comments) and Itching    Patient states she is not allergic to morphine; states had morphine with no reaction   Ciprofloxacin Hcl Other (See Comments)    Mya have made her sick or bad diarrhea   Dicyclomine Diarrhea and Other (See Comments)    Made diarrhea worse   Lamotrigine Other (See Comments) and Diarrhea    Unknown   Metoclopramide Other (See Comments)    Dizziness   Milnacipran Hcl Other (See Comments)    Unknown   Omeprazole Other (See Comments)   Pregabalin Other (See Comments)   Serotonin Reuptake Inhibitors (Ssris) Other (See Comments)   Simvastatin Other (See Comments)    Increased back and muscle pain   Tizanidine Other (See Comments)    unknown   Trazodone And Nefazodone Other (See Comments)    hallucinations   Venlafaxine Other (See Comments)    Affected eyes, blurred vision   Codeine Itching   Morphine Other (See Comments)   Nefazodone Other (See Comments)   Tape Other (See Comments)    Skin gets red and skin pulls off, paper tape only   Sertraline Hcl Other (See Comments)    Stomach upset. Pt takes this med at home 11/2022    Patient Measurements: Height: 4\' 11"  (149.9 cm) Weight: 71.9 kg (158 lb 9.6 oz) IBW/kg (Calculated) : 43.2 Heparin Dosing Weight: 60 kg  Vital Signs: Temp: 97.6 F (36.4 C) (05/08 0408) Temp Source: Oral (05/08 0408) BP: 159/53 (05/08 0408) Pulse Rate: 70 (05/08 0408)  Labs: Recent Labs    11/16/22 1645 11/16/22 1645 11/16/22 1813 11/16/22 1841 11/17/22 0223 11/17/22 0955 11/18/22 0245 11/18/22 0815 11/19/22 0200  HGB 14.6  --   --   --  12.8  --  13.1  --  12.2  HCT 43.1  --   --   --  38.9  --  38.4  --  36.5   PLT 156  --   --   --  145*  --  137*  --  129*  APTT  --   --  29  --   --   --   --   --   --   LABPROT  --   --  13.2  --   --   --   --   --   --   INR  --   --  1.0  --   --   --   --   --   --   HEPARINUNFRC  --    < >  --   --  0.39 0.38  --  0.69 0.43  CREATININE 1.09*  --   --   --  1.11*  --  1.36*  --  1.28*  TROPONINIHS 132*  --   --  129*  --   --   --   --   --    < > = values in this interval not displayed.     Estimated Creatinine Clearance: 33.8 mL/min (A) (by C-G formula based on SCr of 1.28 mg/dL (H)).   Medical History: Past Medical History:  Diagnosis Date   Abnormal chest CT    Anxiety  Arthritis    Rhumatoid and Osteoarthritis   Carotid artery disease (HCC)    Dr. Myra Gianotti, s/p left CEA, severe R ICA stenosis   Coronary atherosclerosis of native coronary artery    DES RCA/circumflex 4/10, LVEF 65 -> repeat cath 11/2021 with multivessel CAD including LM stenosis, initial med rx   Depression    Essential hypertension    Fibromyalgia    GERD (gastroesophageal reflux disease)    Hiatal hernia    Hyperlipidemia    Kidney cysts    NSTEMI (non-ST elevated myocardial infarction) (HCC)    4/10   Renal artery stenosis (HCC)    Left renal artery stent 12/13   Restless leg syndrome    Spondylosis without myelopathy    Tobacco abuse     Assessment: 18 YOF presented with chest pain and anxiety.  Troponin elevated at 132.  Pharmacy consulted to dose IV heparin for ACS.   Heparin level came back therapeutic at 0.43, on 700 units/hr. Hgb 12.2, plt 129. No s/sx of bleeding or infusion issues.   Goal of Therapy:  Heparin level 0.3-0.7 units/ml Monitor platelets by anticoagulation protocol: Yes   Plan:  Continue heparin infusion at 700 units/hr Monitor daily HL, CBC, and for s/sx of bleeding  F/u after cath   Thank you for allowing pharmacy to participate in this patient's care,  Sherron Monday, PharmD, BCCCP Clinical Pharmacist  Phone:  [phone] 11/19/2022 7:30 AM  Please check AMION for all [facility] Pharmacy phone numbers After 10:00 PM, call Main Pharmacy [phone]

## 2022-11-19 NOTE — Progress Notes (Signed)
[facility] PROGRESS NOTE   [name] ZOX:[id number] DOB: Aug 07, 1950 DOA: 11/16/2022  PCP: Heather Specking, MD  Brief History/Interval Summary: 72 y.o. 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 In the ER, troponins elevated    5/5: Admitted, started on heparin and transferred to [facility] 5/6: Cardiology evaluated patient, recommended LHC 5/7: LHC unfortunately shows 90% ostial to mid left main stenosis   Consultants: Cardiology  Procedures: Cardiac catheterization    Subjective/Interval History: Patient denies any chest pain shortness of breath.  Looking forward to her procedure this afternoon.    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 Known LM disease not amenable to CABG.  LHC shows progressed disease.  NSTEMI ruled out. Cardiology is following.  Plan is for intervention to the left main disease today. Patient noted to be on IV heparin infusion.  Patient is on aspirin Plavix amlodipine isosorbide mononitrate, metoprolol.    Chronic kidney disease stage IIIb Baseline since last July appears to be 1.1-1.5 Seems to be close to baseline.  Monitor urine output.  Avoid nephrotoxic agents.   Hypothyroidism - Continue levothyroxine   Fibromyalgia - Continue duloxetine and sertraline   Rheumatoid arthritis (HCC) Old diagnosis, seems quiescent.  Saw Rheum a long time ago, not currently on DMARD.   IPF (idiopathic pulmonary fibrosis) (HCC) HRCT in July 2023 with some UIP, probably RA related.     PAD (peripheral artery disease) (HCC) Stable   CAD (coronary artery disease) See above   Essential hypertension Occasional  elevated blood pressure readings noted.  Continue amlodipine, Imdur, metoprolol   Hyperlipidemia - Continue Crestor 40 - PCSK9 per cardiology  Obesity Estimated body mass index is 32.03 kg/m as calculated from the following:   Height as of this encounter: 4\' 11"  (1.499 m).   Weight as of this encounter: 71.9 kg.  DVT Prophylaxis: On IV heparin Code Status: Full code Family Communication: Discussed with patient Disposition Plan: Home when cleared by cardiology  Status is: Inpatient Remains inpatient appropriate because: Angina with coronary artery disease      Medications: Scheduled:  alum & mag hydroxide-simeth  30 mL Oral Once   amLODipine  10 mg Oral Daily   aspirin EC  81 mg Oral Daily   clopidogrel  75 mg Oral Daily   DULoxetine  20 mg Oral QHS   isosorbide mononitrate  60 mg Oral Daily   levothyroxine  25 mcg Oral Q0600   metoprolol succinate  100 mg Oral Daily   pantoprazole  40 mg Oral Daily   rosuvastatin  40 mg Oral QHS   sertraline  50 mg Oral Daily   sodium chloride flush  3 mL Intravenous Q12H   sodium chloride flush  3 mL Intravenous Q12H   Continuous:  sodium chloride     sodium chloride     sodium chloride 1 mL/kg/hr (11/19/22 0555)   heparin 700 Units/hr (11/19/22 0555)   WJX:BJYNWG chloride, sodium chloride, acetaminophen, ALPRAZolam, fentaNYL (SUBLIMAZE) injection, naLOXone (NARCAN)  injection, nitroGLYCERIN, ondansetron **OR** ondansetron (ZOFRAN) IV, oxyCODONE-acetaminophen, sodium chloride flush, sodium chloride flush  Antibiotics: Anti-infectives (From admission, onward)    None       Objective:  Vital Signs  Vitals:   11/18/22  1631 11/18/22 2026 11/18/22 2126 11/19/22 0408  BP: (!) 116/54 (!) 111/56 (!) 143/51 (!) 159/53  Pulse: 62 64 64 70  Resp: 18 18  18   Temp: 97.8 F (36.6 C) 97.6 F (36.4 C)  97.6 F (36.4 C)  TempSrc: Oral Oral  Oral  SpO2: 94% 96% 94% 95%  Weight:    71.9 kg  Height:        Intake/Output Summary  (Last 24 hours) at 11/19/2022 0934 Last data filed at 11/19/2022 0555 Gross per 24 hour  Intake 477.12 ml  Output --  Net 477.12 ml   Filed Weights   11/16/22 1640 11/19/22 0408  Weight: 68 kg 71.9 kg    General appearance: Awake alert.  In no distress Resp: Clear to auscultation bilaterally.  Normal effort Cardio: S1-S2 is normal regular.  No S3-S4.  No rubs murmurs or bruit GI: Abdomen is soft.  Nontender nondistended.  Bowel sounds are present normal.  No masses organomegaly Extremities: No edema.  Full range of motion of lower extremities. Neurologic: Alert and oriented x3.  No focal neurological deficits.    Lab Results:  Data Reviewed: I have personally reviewed following labs and reports of the imaging studies  CBC: Recent Labs  Lab 11/16/22 1645 11/17/22 0223 11/18/22 0245 11/19/22 0200  WBC 8.0 6.9 5.4 5.1  HGB 14.6 12.8 13.1 12.2  HCT 43.1 38.9 38.4 36.5  MCV 94.5 95.6 94.3 95.3  PLT 156 145* 137* 129*    Basic Metabolic Panel: Recent Labs  Lab 11/16/22 1645 11/17/22 0223 11/18/22 0245 11/19/22 0200  NA 135 135 137 136  K 4.0 3.3* 3.9 3.8  CL 101 102 105 108  CO2 23 25 22  21*  GLUCOSE 111* 102* 129* 113*  BUN 18 15 22 23   CREATININE 1.09* 1.11* 1.36* 1.28*  CALCIUM 9.1 8.6* 8.7* 8.4*    GFR: Estimated Creatinine Clearance: 33.8 mL/min (A) (by C-G formula based on SCr of 1.28 mg/dL (H)).  Liver Function Tests: Recent Labs  Lab 11/17/22 0223  AST 17  ALT 13  ALKPHOS 64  BILITOT 0.5  PROT 6.3*  ALBUMIN 3.5    Coagulation Profile: Recent Labs  Lab 11/16/22 1813  INR 1.0    Lipid Profile: Recent Labs    11/18/22 0245  CHOL 124  HDL 50  LDLCALC 44  TRIG 149  CHOLHDL 2.5     Radiology Studies: CARDIAC CATHETERIZATION  Result Date: 11/18/2022   Ost LM to Mid LM lesion is 90% stenosed.   Mid Cx lesion is 20% stenosed.   Mid Cx to Dist Cx lesion is 70% stenosed.  Prox Cx lesion is 60% stenosed.   Ost RCA to Prox RCA lesion is 20%  stenosed.   Prox RCA to Mid RCA lesion is 30% stenosed.   Prox RCA lesion is 60% stenosed.   RPDA lesion is 100% stenosed. fills via L-R collaterals   RPAV lesion is 60% stenosed.   LV end diastolic pressure is moderately elevated.   There is no aortic valve stenosis. POST-CATH DIAGNOSES Relatively stable elliptical calcified 90% Left Main stenosis with significant catheter dampening during engagement pressures normalized the pressures in the 80s with significant anginal chest pain Progression of proximal RCA disease but otherwise stable disease elsewhere. Significant pressure gradient in the innominate this up to right subclavian artery with tandem lesions leading to a 40 to 50 mmHg gradient. RECOMMENDATIONS Will discuss with ICU team determine best course of action, would likely plan unsupported left  main PCI with either atherectomy versus shockwave lithotripsy and IVUS guidance. Would likely load with Plavix once the plan for PCI is made. Bryan Lemma, MD  ECHOCARDIOGRAM COMPLETE  Result Date: 11/17/2022    ECHOCARDIOGRAM REPORT   Patient Name:   [name] Date of Exam: 11/17/2022 Medical Rec #:  [id number]      Height:       59.0 in Accession #:    [id number]     Weight:       150.0 lb Date of Birth:  13-Oct-1950      BSA:          1.632 m Patient Age:    71 years       BP:           133/50 mmHg Patient Gender: F              HR:           65 bpm. Exam Location:  Inpatient Procedure: 2D Echo, Cardiac Doppler and Color Doppler Indications:    Chest Pain  History:        Patient has prior history of Echocardiogram examinations, most                 recent 09/19/2021. CAD, Signs/Symptoms:Shortness of Breath; Risk                 Factors:Hypertension.  Sonographer:    Raeford Razor Sonographer#2:  Milbert Coulter Referring Phys: Yvonna Alanis, L  Sonographer Comments: Image acquisition challenging due to patient body habitus. IMPRESSIONS  1. Left ventricular ejection fraction, by estimation, is 55 to 60%. The left  ventricle has normal function. The left ventricle has no regional wall motion abnormalities. There is mild concentric left ventricular hypertrophy. Left ventricular diastolic parameters are consistent with Grade I diastolic dysfunction (impaired relaxation).  2. Right ventricular systolic function is normal. The right ventricular size is normal. There is normal pulmonary artery systolic pressure. The estimated right ventricular systolic pressure is 19.2 mmHg.  3. The mitral valve is normal in structure. Trivial mitral valve regurgitation. No evidence of mitral stenosis.  4. The aortic valve is tricuspid. There is mild calcification of the aortic valve. Aortic valve regurgitation is not visualized. No aortic stenosis is present.  5. The inferior vena cava is normal in size with greater than 50% respiratory variability, suggesting right atrial pressure of 3 mmHg. FINDINGS  Left Ventricle: Left ventricular ejection fraction, by estimation, is 55 to 60%. The left ventricle has normal function. The left ventricle has no regional wall motion abnormalities. The left ventricular internal cavity size was normal in size. There is  mild concentric left ventricular hypertrophy. Left ventricular diastolic parameters are consistent with Grade I diastolic dysfunction (impaired relaxation). Right Ventricle: The right ventricular size is normal. No increase in right ventricular wall thickness. Right ventricular systolic function is normal. There is normal pulmonary artery systolic pressure. The tricuspid regurgitant velocity is 2.01 m/s, and  with an assumed right atrial pressure of 3 mmHg, the estimated right ventricular systolic pressure is 19.2 mmHg. Left Atrium: Left atrial size was normal in size. Right Atrium: Right atrial size was normal in size. Pericardium: Trivial pericardial effusion is present. Mitral Valve: The mitral valve is normal in structure. There is mild calcification of the mitral valve leaflet(s). Mild mitral  annular calcification. Trivial mitral valve regurgitation. No evidence of mitral valve stenosis. Tricuspid Valve: The tricuspid valve is normal in structure. Tricuspid valve regurgitation is trivial.  Aortic Valve: The aortic valve is tricuspid. There is mild calcification of the aortic valve. Aortic valve regurgitation is not visualized. No aortic stenosis is present. Aortic valve peak gradient measures 5.8 mmHg. Pulmonic Valve: The pulmonic valve was normal in structure. Pulmonic valve regurgitation is not visualized. Aorta: The aortic root is normal in size and structure. Venous: The inferior vena cava is normal in size with greater than 50% respiratory variability, suggesting right atrial pressure of 3 mmHg. IAS/Shunts: No atrial level shunt detected by color flow Doppler.  LEFT VENTRICLE PLAX 2D LVIDd:         3.80 cm     Diastology LVIDs:         2.70 cm     LV e' medial:    6.74 cm/s LV PW:         1.20 cm     LV E/e' medial:  12.2 LV IVS:        1.10 cm     LV e' lateral:   6.85 cm/s LVOT diam:     1.70 cm     LV E/e' lateral: 12.0 LV SV:         52 LV SV Index:   32 LVOT Area:     2.27 cm  LV Volumes (MOD) LV vol d, MOD A4C: 71.7 ml LV vol s, MOD A4C: 30.2 ml LV SV MOD A4C:     71.7 ml RIGHT VENTRICLE             IVC RV Basal diam:  2.40 cm     IVC diam: 1.40 cm RV S prime:     11.20 cm/s TAPSE (M-mode): 2.9 cm LEFT ATRIUM             Index        RIGHT ATRIUM           Index LA diam:        4.30 cm 2.63 cm/m   RA Area:     15.40 cm LA Vol (A2C):   35.2 ml 21.57 ml/m  RA Volume:   42.10 ml  25.79 ml/m LA Vol (A4C):   28.7 ml 17.58 ml/m LA Biplane Vol: 32.6 ml 19.97 ml/m  AORTIC VALVE AV Area (Vmax): 1.69 cm AV Vmax:        120.00 cm/s AV Peak Grad:   5.8 mmHg LVOT Vmax:      89.60 cm/s LVOT Vmean:     62.400 cm/s LVOT VTI:       0.227 m  AORTA Ao Root diam: 2.60 cm Ao Asc diam:  2.60 cm MITRAL VALVE               TRICUSPID VALVE MV Area (PHT): 2.95 cm    TR Peak grad:   16.2 mmHg MV Decel Time: 257  msec    TR Vmax:        201.00 cm/s MV E velocity: 82.30 cm/s MV A velocity: 91.70 cm/s  SHUNTS MV E/A ratio:  0.90        Systemic VTI:  0.23 m                            Systemic Diam: 1.70 cm Dalton McleanMD Electronically signed by Wilfred Lacy Signature Date/Time: 11/17/2022/4:26:42 PM    Final        LOS: 3 days   Osvaldo Shipper  [facility] Pager on [email or web address]  11/19/2022, 9:34 AM

## 2022-11-19 NOTE — H&P (View-Only) (Signed)
 Rounding Note    Patient Name: [name] Date of Encounter: 11/19/2022  [facility] Cardiologist: Samuel McDowell, MD   Subjective   Feeling well, no chest pain.   Inpatient Medications    Scheduled Meds:  alum & mag hydroxide-simeth  30 mL Oral Once   amLODipine  5 mg Oral Daily   aspirin EC  81 mg Oral Daily   clopidogrel  75 mg Oral Daily   DULoxetine  20 mg Oral QHS   isosorbide mononitrate  60 mg Oral Daily   levothyroxine  25 mcg Oral Q0600   metoprolol succinate  100 mg Oral Daily   pantoprazole  40 mg Oral Daily   rosuvastatin  40 mg Oral QHS   sertraline  50 mg Oral Daily   sodium chloride flush  3 mL Intravenous Q12H   sodium chloride flush  3 mL Intravenous Q12H   Continuous Infusions:  sodium chloride     sodium chloride     sodium chloride 1 mL/kg/hr (11/19/22 0555)   heparin 700 Units/hr (11/19/22 0555)   PRN Meds: sodium chloride, sodium chloride, acetaminophen, ALPRAZolam, fentaNYL (SUBLIMAZE) injection, naLOXone (NARCAN)  injection, nitroGLYCERIN, ondansetron **OR** ondansetron (ZOFRAN) IV, oxyCODONE-acetaminophen, sodium chloride flush, sodium chloride flush   Vital Signs    Vitals:   11/18/22 1631 11/18/22 2026 11/18/22 2126 11/19/22 0408  BP: (!) 116/54 (!) 111/56 (!) 143/51 (!) 159/53  Pulse: 62 64 64 70  Resp: 18 18  18  Temp: 97.8 F (36.6 C) 97.6 F (36.4 C)  97.6 F (36.4 C)  TempSrc: Oral Oral  Oral  SpO2: 94% 96% 94% 95%  Weight:    71.9 kg  Height:        Intake/Output Summary (Last 24 hours) at 11/19/2022 0741 Last data filed at 11/19/2022 0555 Gross per 24 hour  Intake 477.12 ml  Output --  Net 477.12 ml      11/19/2022    4:08 AM 11/16/2022    4:40 PM 10/06/2022   11:05 AM  Last 3 Weights  Weight (lbs) 158 lb 9.6 oz 150 lb 162 lb  Weight (kg) 71.94 kg 68.04 kg 73.483 kg      Telemetry    Sinus Rhythm - Personally Reviewed  ECG    No new tracing  Physical Exam   GEN: No acute distress.   Neck: No  JVD Cardiac: RRR, no murmurs, rubs, or gallops.  Respiratory: Clear to auscultation bilaterally. GI: Soft, nontender, non-distended  MS: No edema; No deformity. Right radial cath site stable Neuro:  Nonfocal  Psych: Normal affect   Labs    High Sensitivity Troponin:   Recent Labs  Lab 11/16/22 1645 11/16/22 1841  TROPONINIHS 132* 129*     Chemistry Recent Labs  Lab 11/17/22 0223 11/18/22 0245 11/19/22 0200  NA 135 137 136  K 3.3* 3.9 3.8  CL 102 105 108  CO2 25 22 21*  GLUCOSE 102* 129* 113*  BUN 15 22 23  CREATININE 1.11* 1.36* 1.28*  CALCIUM 8.6* 8.7* 8.4*  PROT 6.3*  --   --   ALBUMIN 3.5  --   --   AST 17  --   --   ALT 13  --   --   ALKPHOS 64  --   --   BILITOT 0.5  --   --   GFRNONAA 52* 41* 44*  ANIONGAP 8 10 7    Lipids  Recent Labs  Lab 11/18/22 0245  CHOL   124  TRIG 149  HDL 50  LDLCALC 44  CHOLHDL 2.5    Hematology Recent Labs  Lab 11/17/22 0223 11/18/22 0245 11/19/22 0200  WBC 6.9 5.4 5.1  RBC 4.07 4.07 3.83*  HGB 12.8 13.1 12.2  HCT 38.9 38.4 36.5  MCV 95.6 94.3 95.3  MCH 31.4 32.2 31.9  MCHC 32.9 34.1 33.4  RDW 13.2 13.1 13.1  PLT 145* 137* 129*   Thyroid No results for input(s): "TSH", "FREET4" in the last 168 hours.  BNPNo results for input(s): "BNP", "PROBNP" in the last 168 hours.  DDimer No results for input(s): "DDIMER" in the last 168 hours.   Radiology    CARDIAC CATHETERIZATION  Result Date: 11/18/2022   Ost LM to Mid LM lesion is 90% stenosed.   Mid Cx lesion is 20% stenosed.   Mid Cx to Dist Cx lesion is 70% stenosed.  Prox Cx lesion is 60% stenosed.   Ost RCA to Prox RCA lesion is 20% stenosed.   Prox RCA to Mid RCA lesion is 30% stenosed.   Prox RCA lesion is 60% stenosed.   RPDA lesion is 100% stenosed. fills via L-R collaterals   RPAV lesion is 60% stenosed.   LV end diastolic pressure is moderately elevated.   There is no aortic valve stenosis. POST-CATH DIAGNOSES Relatively stable elliptical calcified 90% Left  Main stenosis with significant catheter dampening during engagement pressures normalized the pressures in the 80s with significant anginal chest pain Progression of proximal RCA disease but otherwise stable disease elsewhere. Significant pressure gradient in the innominate this up to right subclavian artery with tandem lesions leading to a 40 to 50 mmHg gradient. RECOMMENDATIONS Will discuss with ICU team determine best course of action, would likely plan unsupported left main PCI with either atherectomy versus shockwave lithotripsy and IVUS guidance. Would likely load with Plavix once the plan for PCI is made. David Harding, MD  ECHOCARDIOGRAM COMPLETE  Result Date: 11/17/2022    ECHOCARDIOGRAM REPORT   Patient Name:   Mercer H Baldwin Date of Exam: 11/17/2022 Medical Rec #:  [id number]      Height:       59.0 in Accession #:    [id number]     Weight:       150.0 lb Date of Birth:  01/15/1951      BSA:          1.632 m Patient Age:    72 years       BP:           133/50 mmHg Patient Gender: F              HR:           65 bpm. Exam Location:  Inpatient Procedure: 2D Echo, Cardiac Doppler and Color Doppler Indications:    Chest Pain  History:        Patient has prior history of Echocardiogram examinations, most                 recent 09/19/2021. CAD, Signs/Symptoms:Shortness of Breath; Risk                 Factors:Hypertension.  Sonographer:    Mia Henry Sonographer#2:  Melissa Kafa Referring Phys: HALEY, SHENG, L  Sonographer Comments: Image acquisition challenging due to patient body habitus. IMPRESSIONS  1. Left ventricular ejection fraction, by estimation, is 55 to 60%. The left ventricle has normal function. The left ventricle has no regional wall motion abnormalities. There is   mild concentric left ventricular hypertrophy. Left ventricular diastolic parameters are consistent with Grade I diastolic dysfunction (impaired relaxation).  2. Right ventricular systolic function is normal. The right ventricular size is  normal. There is normal pulmonary artery systolic pressure. The estimated right ventricular systolic pressure is 19.2 mmHg.  3. The mitral valve is normal in structure. Trivial mitral valve regurgitation. No evidence of mitral stenosis.  4. The aortic valve is tricuspid. There is mild calcification of the aortic valve. Aortic valve regurgitation is not visualized. No aortic stenosis is present.  5. The inferior vena cava is normal in size with greater than 50% respiratory variability, suggesting right atrial pressure of 3 mmHg. FINDINGS  Left Ventricle: Left ventricular ejection fraction, by estimation, is 55 to 60%. The left ventricle has normal function. The left ventricle has no regional wall motion abnormalities. The left ventricular internal cavity size was normal in size. There is  mild concentric left ventricular hypertrophy. Left ventricular diastolic parameters are consistent with Grade I diastolic dysfunction (impaired relaxation). Right Ventricle: The right ventricular size is normal. No increase in right ventricular wall thickness. Right ventricular systolic function is normal. There is normal pulmonary artery systolic pressure. The tricuspid regurgitant velocity is 2.01 m/s, and  with an assumed right atrial pressure of 3 mmHg, the estimated right ventricular systolic pressure is 19.2 mmHg. Left Atrium: Left atrial size was normal in size. Right Atrium: Right atrial size was normal in size. Pericardium: Trivial pericardial effusion is present. Mitral Valve: The mitral valve is normal in structure. There is mild calcification of the mitral valve leaflet(s). Mild mitral annular calcification. Trivial mitral valve regurgitation. No evidence of mitral valve stenosis. Tricuspid Valve: The tricuspid valve is normal in structure. Tricuspid valve regurgitation is trivial. Aortic Valve: The aortic valve is tricuspid. There is mild calcification of the aortic valve. Aortic valve regurgitation is not visualized.  No aortic stenosis is present. Aortic valve peak gradient measures 5.8 mmHg. Pulmonic Valve: The pulmonic valve was normal in structure. Pulmonic valve regurgitation is not visualized. Aorta: The aortic root is normal in size and structure. Venous: The inferior vena cava is normal in size with greater than 50% respiratory variability, suggesting right atrial pressure of 3 mmHg. IAS/Shunts: No atrial level shunt detected by color flow Doppler.  LEFT VENTRICLE PLAX 2D LVIDd:         3.80 cm     Diastology LVIDs:         2.70 cm     LV e' medial:    6.74 cm/s LV PW:         1.20 cm     LV E/e' medial:  12.2 LV IVS:        1.10 cm     LV e' lateral:   6.85 cm/s LVOT diam:     1.70 cm     LV E/e' lateral: 12.0 LV SV:         52 LV SV Index:   32 LVOT Area:     2.27 cm  LV Volumes (MOD) LV vol d, MOD A4C: 71.7 ml LV vol s, MOD A4C: 30.2 ml LV SV MOD A4C:     71.7 ml RIGHT VENTRICLE             IVC RV Basal diam:  2.40 cm     IVC diam: 1.40 cm RV S prime:     11.20 cm/s TAPSE (M-mode): 2.9 cm LEFT ATRIUM               Index        RIGHT ATRIUM           Index LA diam:        4.30 cm 2.63 cm/m   RA Area:     15.40 cm LA Vol (A2C):   35.2 ml 21.57 ml/m  RA Volume:   42.10 ml  25.79 ml/m LA Vol (A4C):   28.7 ml 17.58 ml/m LA Biplane Vol: 32.6 ml 19.97 ml/m  AORTIC VALVE AV Area (Vmax): 1.69 cm AV Vmax:        120.00 cm/s AV Peak Grad:   5.8 mmHg LVOT Vmax:      89.60 cm/s LVOT Vmean:     62.400 cm/s LVOT VTI:       0.227 m  AORTA Ao Root diam: 2.60 cm Ao Asc diam:  2.60 cm MITRAL VALVE               TRICUSPID VALVE MV Area (PHT): 2.95 cm    TR Peak grad:   16.2 mmHg MV Decel Time: 257 msec    TR Vmax:        201.00 cm/s MV E velocity: 82.30 cm/s MV A velocity: 91.70 cm/s  SHUNTS MV E/A ratio:  0.90        Systemic VTI:  0.23 m                            Systemic Diam: 1.70 cm Dalton McleanMD Electronically signed by Dalton McleanMD Signature Date/Time: 11/17/2022/4:26:42 PM    Final     Cardiac Studies   Echo:  11/17/2022  IMPRESSIONS     1. Left ventricular ejection fraction, by estimation, is 55 to 60%. The  left ventricle has normal function. The left ventricle has no regional  wall motion abnormalities. There is mild concentric left ventricular  hypertrophy. Left ventricular diastolic  parameters are consistent with Grade I diastolic dysfunction (impaired  relaxation).   2. Right ventricular systolic function is normal. The right ventricular  size is normal. There is normal pulmonary artery systolic pressure. The  estimated right ventricular systolic pressure is 19.2 mmHg.   3. The mitral valve is normal in structure. Trivial mitral valve  regurgitation. No evidence of mitral stenosis.   4. The aortic valve is tricuspid. There is mild calcification of the  aortic valve. Aortic valve regurgitation is not visualized. No aortic  stenosis is present.   5. The inferior vena cava is normal in size with greater than 50%  respiratory variability, suggesting right atrial pressure of 3 mmHg.   FINDINGS   Left Ventricle: Left ventricular ejection fraction, by estimation, is 55  to 60%. The left ventricle has normal function. The left ventricle has no  regional wall motion abnormalities. The left ventricular internal cavity  size was normal in size. There is   mild concentric left ventricular hypertrophy. Left ventricular diastolic  parameters are consistent with Grade I diastolic dysfunction (impaired  relaxation).   Right Ventricle: The right ventricular size is normal. No increase in  right ventricular wall thickness. Right ventricular systolic function is  normal. There is normal pulmonary artery systolic pressure. The tricuspid  regurgitant velocity is 2.01 m/s, and   with an assumed right atrial pressure of 3 mmHg, the estimated right  ventricular systolic pressure is 19.2 mmHg.   Left Atrium: Left atrial size was normal in size.   Right Atrium: Right atrial size was normal in size.      Pericardium: Trivial pericardial effusion is present.   Mitral Valve: The mitral valve is normal in structure. There is mild  calcification of the mitral valve leaflet(s). Mild mitral annular  calcification. Trivial mitral valve regurgitation. No evidence of mitral  valve stenosis.   Tricuspid Valve: The tricuspid valve is normal in structure. Tricuspid  valve regurgitation is trivial.   Aortic Valve: The aortic valve is tricuspid. There is mild calcification  of the aortic valve. Aortic valve regurgitation is not visualized. No  aortic stenosis is present. Aortic valve peak gradient measures 5.8 mmHg.   Pulmonic Valve: The pulmonic valve was normal in structure. Pulmonic valve  regurgitation is not visualized.   Aorta: The aortic root is normal in size and structure.   Venous: The inferior vena cava is normal in size with greater than 50%  respiratory variability, suggesting right atrial pressure of 3 mmHg.   IAS/Shunts: No atrial level shunt detected by color flow Doppler.   Cath: 11/18/2022    Ost LM to Mid LM lesion is 90% stenosed.   Mid Cx lesion is 20% stenosed.   Mid Cx to Dist Cx lesion is 70% stenosed.  Prox Cx lesion is 60% stenosed.   Ost RCA to Prox RCA lesion is 20% stenosed.   Prox RCA to Mid RCA lesion is 30% stenosed.   Prox RCA lesion is 60% stenosed.   RPDA lesion is 100% stenosed. fills via L-R collaterals   RPAV lesion is 60% stenosed.   LV end diastolic pressure is moderately elevated.   There is no aortic valve stenosis.   POST-CATH DIAGNOSES Relatively stable elliptical calcified 90% Left Main stenosis with significant catheter dampening during engagement pressures normalized the pressures in the 80s with significant anginal chest pain Progression of proximal RCA disease but otherwise stable disease elsewhere. Significant pressure gradient in the innominate this up to right subclavian artery with tandem lesions leading to a 40 to 50 mmHg gradient.    RECOMMENDATIONS Will discuss with ICU team determine best course of action, would likely plan unsupported left main PCI with either atherectomy versus shockwave lithotripsy and IVUS guidance. Would likely load with Plavix once the plan for PCI is made.   David Harding, MD  Diagnostic Dominance: Right   Patient Profile     71 y.o. 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 Danford.   Assessment & Plan    Chest pain CAD with severe multivessel disease (L main 90%, Lcx 70%, PDA 90% and prior history of DES to RCA and Lcx) -- Patient presented with progressive angina, dyspnea with associated weakness, diaphoresis, numbness, and tingling. EKG without acute ischemia. Troponin 132->129.  -- Patient previously seen by [facility] in 2023 and deemed not a surgical candidate due to extensive aortic root/arch calcifications. Off-pump LIMA to LAD was also not an option due to subclavian stenosis Given high risk of PCI, medical management recommended at that time.   -- LHC 5/7 showed Ost LM to Mid LM lesion is 90% stenosed.  Cath films reviewed with plans for left main intervention today with Dr. Arida -- Continue ASA 81mg, plavix, Toprol XL 100mg, Imdur 60mg daily  -- IV heparin    Hypertension -- elevated this morning -- increase   amlodipine 5>>10mg daily, continue Toprol 100mg daily, Imdur 60mg daily    Hyperlipidemia -- continue Crestor 40mg daily  For questions or updates, please contact [facility] Please consult [email or web address] for contact info under        Signed, Lindsay Roberts, NP  11/19/2022, 7:41 AM    I have examined the patient and reviewed assessment and plan and discussed with patient.  Agree with above as stated.     We again discussed the plan for high risk PCI today.  Plan is for atherectomy.  She is aware that this will be done from the femoral approach.  Discussed the case with Dr. Reda.  Plan is for a short stent in the ostial left main.  The other disease will be left alone.  I suspect her marked symptoms will be improved with left main intervention.  She again stated that her current level of activity that causes angina is really limiting quality of life.  She is agreeable to proceeding with cardiac catheterization and PCI.  All questions answered.  Mariesha Venturella   

## 2022-11-19 NOTE — Progress Notes (Addendum)
Rounding Note    Patient Name: [name] Date of Encounter: 11/19/2022  [facility] Cardiologist: Nona Dell, MD   Subjective   Feeling well, no chest pain.   Inpatient Medications    Scheduled Meds:  alum & mag hydroxide-simeth  30 mL Oral Once   amLODipine  5 mg Oral Daily   aspirin EC  81 mg Oral Daily   clopidogrel  75 mg Oral Daily   DULoxetine  20 mg Oral QHS   isosorbide mononitrate  60 mg Oral Daily   levothyroxine  25 mcg Oral Q0600   metoprolol succinate  100 mg Oral Daily   pantoprazole  40 mg Oral Daily   rosuvastatin  40 mg Oral QHS   sertraline  50 mg Oral Daily   sodium chloride flush  3 mL Intravenous Q12H   sodium chloride flush  3 mL Intravenous Q12H   Continuous Infusions:  sodium chloride     sodium chloride     sodium chloride 1 mL/kg/hr (11/19/22 0555)   heparin 700 Units/hr (11/19/22 0555)   PRN Meds: sodium chloride, sodium chloride, acetaminophen, ALPRAZolam, fentaNYL (SUBLIMAZE) injection, naLOXone (NARCAN)  injection, nitroGLYCERIN, ondansetron **OR** ondansetron (ZOFRAN) IV, oxyCODONE-acetaminophen, sodium chloride flush, sodium chloride flush   Vital Signs    Vitals:   11/18/22 1631 11/18/22 2026 11/18/22 2126 11/19/22 0408  BP: (!) 116/54 (!) 111/56 (!) 143/51 (!) 159/53  Pulse: 62 64 64 70  Resp: 18 18  18   Temp: 97.8 F (36.6 C) 97.6 F (36.4 C)  97.6 F (36.4 C)  TempSrc: Oral Oral  Oral  SpO2: 94% 96% 94% 95%  Weight:    71.9 kg  Height:        Intake/Output Summary (Last 24 hours) at 11/19/2022 0741 Last data filed at 11/19/2022 0555 Gross per 24 hour  Intake 477.12 ml  Output --  Net 477.12 ml      11/19/2022    4:08 AM 11/16/2022    4:40 PM 10/06/2022   11:05 AM  Last 3 Weights  Weight (lbs) 158 lb 9.6 oz 150 lb 162 lb  Weight (kg) 71.94 kg 68.04 kg 73.483 kg      Telemetry    Sinus Rhythm - Personally Reviewed  ECG    No new tracing  Physical Exam   GEN: No acute distress.   Neck: No  JVD Cardiac: RRR, no murmurs, rubs, or gallops.  Respiratory: Clear to auscultation bilaterally. GI: Soft, nontender, non-distended  MS: No edema; No deformity. Right radial cath site stable Neuro:  Nonfocal  Psych: Normal affect   Labs    High Sensitivity Troponin:   Recent Labs  Lab 11/16/22 1645 11/16/22 1841  TROPONINIHS 132* 129*     Chemistry Recent Labs  Lab 11/17/22 0223 11/18/22 0245 11/19/22 0200  NA 135 137 136  K 3.3* 3.9 3.8  CL 102 105 108  CO2 25 22 21*  GLUCOSE 102* 129* 113*  BUN 15 22 23   CREATININE 1.11* 1.36* 1.28*  CALCIUM 8.6* 8.7* 8.4*  PROT 6.3*  --   --   ALBUMIN 3.5  --   --   AST 17  --   --   ALT 13  --   --   ALKPHOS 64  --   --   BILITOT 0.5  --   --   GFRNONAA 52* 41* 44*  ANIONGAP 8 10 7     Lipids  Recent Labs  Lab 11/18/22 0245  CHOL  124  TRIG 149  HDL 50  LDLCALC 44  CHOLHDL 2.5    Hematology Recent Labs  Lab 11/17/22 0223 11/18/22 0245 11/19/22 0200  WBC 6.9 5.4 5.1  RBC 4.07 4.07 3.83*  HGB 12.8 13.1 12.2  HCT 38.9 38.4 36.5  MCV 95.6 94.3 95.3  MCH 31.4 32.2 31.9  MCHC 32.9 34.1 33.4  RDW 13.2 13.1 13.1  PLT 145* 137* 129*   Thyroid No results for input(s): "TSH", "FREET4" in the last 168 hours.  BNPNo results for input(s): "BNP", "PROBNP" in the last 168 hours.  DDimer No results for input(s): "DDIMER" in the last 168 hours.   Radiology    CARDIAC CATHETERIZATION  Result Date: 11/18/2022   Ost LM to Mid LM lesion is 90% stenosed.   Mid Cx lesion is 20% stenosed.   Mid Cx to Dist Cx lesion is 70% stenosed.  Prox Cx lesion is 60% stenosed.   Ost RCA to Prox RCA lesion is 20% stenosed.   Prox RCA to Mid RCA lesion is 30% stenosed.   Prox RCA lesion is 60% stenosed.   RPDA lesion is 100% stenosed. fills via L-R collaterals   RPAV lesion is 60% stenosed.   LV end diastolic pressure is moderately elevated.   There is no aortic valve stenosis. POST-CATH DIAGNOSES Relatively stable elliptical calcified 90% Left  Main stenosis with significant catheter dampening during engagement pressures normalized the pressures in the 80s with significant anginal chest pain Progression of proximal RCA disease but otherwise stable disease elsewhere. Significant pressure gradient in the innominate this up to right subclavian artery with tandem lesions leading to a 40 to 50 mmHg gradient. RECOMMENDATIONS Will discuss with ICU team determine best course of action, would likely plan unsupported left main PCI with either atherectomy versus shockwave lithotripsy and IVUS guidance. Would likely load with Plavix once the plan for PCI is made. Bryan Lemma, MD  ECHOCARDIOGRAM COMPLETE  Result Date: 11/17/2022    ECHOCARDIOGRAM REPORT   Patient Name:   [name] Date of Exam: 11/17/2022 Medical Rec #:  [id number]      Height:       59.0 in Accession #:    [id number]     Weight:       150.0 lb Date of Birth:  05-19-1951      BSA:          1.632 m Patient Age:    71 years       BP:           133/50 mmHg Patient Gender: F              HR:           65 bpm. Exam Location:  Inpatient Procedure: 2D Echo, Cardiac Doppler and Color Doppler Indications:    Chest Pain  History:        Patient has prior history of Echocardiogram examinations, most                 recent 09/19/2021. CAD, Signs/Symptoms:Shortness of Breath; Risk                 Factors:Hypertension.  Sonographer:    Raeford Razor Sonographer#2:  Milbert Coulter Referring Phys: Yvonna Alanis, L  Sonographer Comments: Image acquisition challenging due to patient body habitus. IMPRESSIONS  1. Left ventricular ejection fraction, by estimation, is 55 to 60%. The left ventricle has normal function. The left ventricle has no regional wall motion abnormalities. There is  mild concentric left ventricular hypertrophy. Left ventricular diastolic parameters are consistent with Grade I diastolic dysfunction (impaired relaxation).  2. Right ventricular systolic function is normal. The right ventricular size is  normal. There is normal pulmonary artery systolic pressure. The estimated right ventricular systolic pressure is 19.2 mmHg.  3. The mitral valve is normal in structure. Trivial mitral valve regurgitation. No evidence of mitral stenosis.  4. The aortic valve is tricuspid. There is mild calcification of the aortic valve. Aortic valve regurgitation is not visualized. No aortic stenosis is present.  5. The inferior vena cava is normal in size with greater than 50% respiratory variability, suggesting right atrial pressure of 3 mmHg. FINDINGS  Left Ventricle: Left ventricular ejection fraction, by estimation, is 55 to 60%. The left ventricle has normal function. The left ventricle has no regional wall motion abnormalities. The left ventricular internal cavity size was normal in size. There is  mild concentric left ventricular hypertrophy. Left ventricular diastolic parameters are consistent with Grade I diastolic dysfunction (impaired relaxation). Right Ventricle: The right ventricular size is normal. No increase in right ventricular wall thickness. Right ventricular systolic function is normal. There is normal pulmonary artery systolic pressure. The tricuspid regurgitant velocity is 2.01 m/s, and  with an assumed right atrial pressure of 3 mmHg, the estimated right ventricular systolic pressure is 19.2 mmHg. Left Atrium: Left atrial size was normal in size. Right Atrium: Right atrial size was normal in size. Pericardium: Trivial pericardial effusion is present. Mitral Valve: The mitral valve is normal in structure. There is mild calcification of the mitral valve leaflet(s). Mild mitral annular calcification. Trivial mitral valve regurgitation. No evidence of mitral valve stenosis. Tricuspid Valve: The tricuspid valve is normal in structure. Tricuspid valve regurgitation is trivial. Aortic Valve: The aortic valve is tricuspid. There is mild calcification of the aortic valve. Aortic valve regurgitation is not visualized.  No aortic stenosis is present. Aortic valve peak gradient measures 5.8 mmHg. Pulmonic Valve: The pulmonic valve was normal in structure. Pulmonic valve regurgitation is not visualized. Aorta: The aortic root is normal in size and structure. Venous: The inferior vena cava is normal in size with greater than 50% respiratory variability, suggesting right atrial pressure of 3 mmHg. IAS/Shunts: No atrial level shunt detected by color flow Doppler.  LEFT VENTRICLE PLAX 2D LVIDd:         3.80 cm     Diastology LVIDs:         2.70 cm     LV e' medial:    6.74 cm/s LV PW:         1.20 cm     LV E/e' medial:  12.2 LV IVS:        1.10 cm     LV e' lateral:   6.85 cm/s LVOT diam:     1.70 cm     LV E/e' lateral: 12.0 LV SV:         52 LV SV Index:   32 LVOT Area:     2.27 cm  LV Volumes (MOD) LV vol d, MOD A4C: 71.7 ml LV vol s, MOD A4C: 30.2 ml LV SV MOD A4C:     71.7 ml RIGHT VENTRICLE             IVC RV Basal diam:  2.40 cm     IVC diam: 1.40 cm RV S prime:     11.20 cm/s TAPSE (M-mode): 2.9 cm LEFT ATRIUM  Index        RIGHT ATRIUM           Index LA diam:        4.30 cm 2.63 cm/m   RA Area:     15.40 cm LA Vol (A2C):   35.2 ml 21.57 ml/m  RA Volume:   42.10 ml  25.79 ml/m LA Vol (A4C):   28.7 ml 17.58 ml/m LA Biplane Vol: 32.6 ml 19.97 ml/m  AORTIC VALVE AV Area (Vmax): 1.69 cm AV Vmax:        120.00 cm/s AV Peak Grad:   5.8 mmHg LVOT Vmax:      89.60 cm/s LVOT Vmean:     62.400 cm/s LVOT VTI:       0.227 m  AORTA Ao Root diam: 2.60 cm Ao Asc diam:  2.60 cm MITRAL VALVE               TRICUSPID VALVE MV Area (PHT): 2.95 cm    TR Peak grad:   16.2 mmHg MV Decel Time: 257 msec    TR Vmax:        201.00 cm/s MV E velocity: 82.30 cm/s MV A velocity: 91.70 cm/s  SHUNTS MV E/A ratio:  0.90        Systemic VTI:  0.23 m                            Systemic Diam: 1.70 cm Dalton McleanMD Electronically signed by Wilfred Lacy Signature Date/Time: 11/17/2022/4:26:42 PM    Final     Cardiac Studies   Echo:  11/17/2022  IMPRESSIONS     1. Left ventricular ejection fraction, by estimation, is 55 to 60%. The  left ventricle has normal function. The left ventricle has no regional  wall motion abnormalities. There is mild concentric left ventricular  hypertrophy. Left ventricular diastolic  parameters are consistent with Grade I diastolic dysfunction (impaired  relaxation).   2. Right ventricular systolic function is normal. The right ventricular  size is normal. There is normal pulmonary artery systolic pressure. The  estimated right ventricular systolic pressure is 19.2 mmHg.   3. The mitral valve is normal in structure. Trivial mitral valve  regurgitation. No evidence of mitral stenosis.   4. The aortic valve is tricuspid. There is mild calcification of the  aortic valve. Aortic valve regurgitation is not visualized. No aortic  stenosis is present.   5. The inferior vena cava is normal in size with greater than 50%  respiratory variability, suggesting right atrial pressure of 3 mmHg.   FINDINGS   Left Ventricle: Left ventricular ejection fraction, by estimation, is 55  to 60%. The left ventricle has normal function. The left ventricle has no  regional wall motion abnormalities. The left ventricular internal cavity  size was normal in size. There is   mild concentric left ventricular hypertrophy. Left ventricular diastolic  parameters are consistent with Grade I diastolic dysfunction (impaired  relaxation).   Right Ventricle: The right ventricular size is normal. No increase in  right ventricular wall thickness. Right ventricular systolic function is  normal. There is normal pulmonary artery systolic pressure. The tricuspid  regurgitant velocity is 2.01 m/s, and   with an assumed right atrial pressure of 3 mmHg, the estimated right  ventricular systolic pressure is 19.2 mmHg.   Left Atrium: Left atrial size was normal in size.   Right Atrium: Right atrial size was normal in size.  Pericardium: Trivial pericardial effusion is present.   Mitral Valve: The mitral valve is normal in structure. There is mild  calcification of the mitral valve leaflet(s). Mild mitral annular  calcification. Trivial mitral valve regurgitation. No evidence of mitral  valve stenosis.   Tricuspid Valve: The tricuspid valve is normal in structure. Tricuspid  valve regurgitation is trivial.   Aortic Valve: The aortic valve is tricuspid. There is mild calcification  of the aortic valve. Aortic valve regurgitation is not visualized. No  aortic stenosis is present. Aortic valve peak gradient measures 5.8 mmHg.   Pulmonic Valve: The pulmonic valve was normal in structure. Pulmonic valve  regurgitation is not visualized.   Aorta: The aortic root is normal in size and structure.   Venous: The inferior vena cava is normal in size with greater than 50%  respiratory variability, suggesting right atrial pressure of 3 mmHg.   IAS/Shunts: No atrial level shunt detected by color flow Doppler.   Cath: 11/18/2022    Ost LM to Mid LM lesion is 90% stenosed.   Mid Cx lesion is 20% stenosed.   Mid Cx to Dist Cx lesion is 70% stenosed.  Prox Cx lesion is 60% stenosed.   Ost RCA to Prox RCA lesion is 20% stenosed.   Prox RCA to Mid RCA lesion is 30% stenosed.   Prox RCA lesion is 60% stenosed.   RPDA lesion is 100% stenosed. fills via L-R collaterals   RPAV lesion is 60% stenosed.   LV end diastolic pressure is moderately elevated.   There is no aortic valve stenosis.   POST-CATH DIAGNOSES Relatively stable elliptical calcified 90% Left Main stenosis with significant catheter dampening during engagement pressures normalized the pressures in the 80s with significant anginal chest pain Progression of proximal RCA disease but otherwise stable disease elsewhere. Significant pressure gradient in the innominate this up to right subclavian artery with tandem lesions leading to a 40 to 50 mmHg gradient.    RECOMMENDATIONS Will discuss with ICU team determine best course of action, would likely plan unsupported left main PCI with either atherectomy versus shockwave lithotripsy and IVUS guidance. Would likely load with Plavix once the plan for PCI is made.   Bryan Lemma, MD  Diagnostic Dominance: Right   Patient Profile     72 y.o. 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 Maryfrances Bunnell.   Assessment & Plan    Chest pain CAD with severe multivessel disease (L main 90%, Lcx 70%, PDA 90% and prior history of DES to RCA and Lcx) -- Patient presented with progressive angina, dyspnea with associated weakness, diaphoresis, numbness, and tingling. EKG without acute ischemia. Troponin 132->129.  -- Patient previously seen by [facility] in 2023 and deemed not a surgical candidate due to extensive aortic root/arch calcifications. Off-pump LIMA to LAD was also not an option due to subclavian stenosis Given high risk of PCI, medical management recommended at that time.   -- LHC 5/7 showed Ost LM to Mid LM lesion is 90% stenosed.  Cath films reviewed with plans for left main intervention today with Dr. Kirke Corin -- Continue ASA 81mg , plavix, Toprol XL 100mg , Imdur 60mg  daily  -- IV heparin    Hypertension -- elevated this morning -- increase  amlodipine 5>>10mg  daily, continue Toprol 100mg  daily, Imdur 60mg  daily    Hyperlipidemia -- continue Crestor 40mg  daily  For questions or updates, please contact [facility] Please consult [email or web address] for contact info under        Signed, Laverda Page, NP  11/19/2022, 7:41 AM    I have examined the patient and reviewed assessment and plan and discussed with patient.  Agree with above as stated.     We again discussed the plan for high risk PCI today.  Plan is for atherectomy.  She is aware that this will be done from the femoral approach.  Discussed the case with Dr. Hilary Hertz.  Plan is for a short stent in the ostial left main.  The other disease will be left alone.  I suspect her marked symptoms will be improved with left main intervention.  She again stated that her current level of activity that causes angina is really limiting quality of life.  She is agreeable to proceeding with cardiac catheterization and PCI.  All questions answered.  Lance Muss

## 2022-11-19 NOTE — Discharge Instructions (Signed)

## 2022-11-20 ENCOUNTER — Encounter (HOSPITAL_COMMUNITY): Payer: Self-pay | Admitting: Cardiovascular Disease

## 2022-11-20 ENCOUNTER — Other Ambulatory Visit (HOSPITAL_COMMUNITY): Payer: Self-pay

## 2022-11-20 DIAGNOSIS — I200 Unstable angina: Secondary | ICD-10-CM | POA: Diagnosis not present

## 2022-11-20 DIAGNOSIS — E782 Mixed hyperlipidemia: Secondary | ICD-10-CM | POA: Diagnosis not present

## 2022-11-20 DIAGNOSIS — I249 Acute ischemic heart disease, unspecified: Secondary | ICD-10-CM | POA: Diagnosis not present

## 2022-11-20 DIAGNOSIS — N1832 Chronic kidney disease, stage 3b: Secondary | ICD-10-CM

## 2022-11-20 DIAGNOSIS — I10 Essential (primary) hypertension: Secondary | ICD-10-CM | POA: Diagnosis not present

## 2022-11-20 LAB — CBC
HCT: 34.3 % — ABNORMAL LOW (ref 36.0–46.0)
Hemoglobin: 11.6 g/dL — ABNORMAL LOW (ref 12.0–15.0)
MCH: 31.7 pg (ref 26.0–34.0)
MCHC: 33.8 g/dL (ref 30.0–36.0)
MCV: 93.7 fL (ref 80.0–100.0)
Platelets: 129 10*3/uL — ABNORMAL LOW (ref 150–400)
RBC: 3.66 MIL/uL — ABNORMAL LOW (ref 3.87–5.11)
RDW: 13 % (ref 11.5–15.5)
WBC: 4.2 10*3/uL (ref 4.0–10.5)
nRBC: 0 % (ref 0.0–0.2)

## 2022-11-20 LAB — BASIC METABOLIC PANEL
Anion gap: 6 (ref 5–15)
BUN: 18 mg/dL (ref 8–23)
CO2: 23 mmol/L (ref 22–32)
Calcium: 8.4 mg/dL — ABNORMAL LOW (ref 8.9–10.3)
Chloride: 108 mmol/L (ref 98–111)
Creatinine, Ser: 1.26 mg/dL — ABNORMAL HIGH (ref 0.44–1.00)
GFR, Estimated: 45 mL/min — ABNORMAL LOW (ref >60)
Glucose, Bld: 105 mg/dL — ABNORMAL HIGH (ref 70–99)
Potassium: 3.8 mmol/L (ref 3.5–5.1)
Sodium: 137 mmol/L (ref 135–145)

## 2022-11-20 MED ORDER — CLOPIDOGREL BISULFATE 75 MG PO TABS
75.0000 mg | ORAL_TABLET | Freq: Every day | ORAL | 3 refills | Status: DC
  Filled 2022-11-20: qty 30, 30d supply, fill #0

## 2022-11-20 MED ORDER — METOPROLOL SUCCINATE ER 100 MG PO TB24
100.0000 mg | ORAL_TABLET | Freq: Every day | ORAL | 2 refills | Status: DC
  Filled 2022-11-20: qty 30, 30d supply, fill #0

## 2022-11-20 MED ORDER — AMLODIPINE BESYLATE 10 MG PO TABS
10.0000 mg | ORAL_TABLET | Freq: Every day | ORAL | 2 refills | Status: DC
  Filled 2022-11-20: qty 30, 30d supply, fill #0

## 2022-11-20 MED ORDER — ISOSORBIDE MONONITRATE ER 60 MG PO TB24
60.0000 mg | ORAL_TABLET | Freq: Every day | ORAL | 2 refills | Status: DC
  Filled 2022-11-20: qty 30, 30d supply, fill #0

## 2022-11-20 MED FILL — Nitroglycerin IV Soln 100 MCG/ML in D5W: INTRA_ARTERIAL | Qty: 10 | Status: AC

## 2022-11-20 NOTE — Progress Notes (Signed)
CARDIAC REHAB PHASE I   Pt and daughter preparing for discharge home. Pt has walked in room and bathed by sink, tolerating well with no CP, dizziness or SOB. Post stent education including site care, restrictions, risk factors, exercise guidelines, heart healthy diet, NTG use, antiplatelet therapy importance and CRP2 reviewed. All questions and concerns addressed. Will refer to AP for CRP2.  [id number]  Woodroe Chen, RN BSN 11/20/2022 12:29 PM

## 2022-11-20 NOTE — Discharge Summary (Signed)
[facility]  Physician Discharge Summary   Patient ID: TREYA ALTER MRN: [id number] DOB/AGE: September 18, 1950 72 y.o.  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 In the ER, troponins elevated    5/5: Admitted, started on heparin and transferred to [facility] 5/6: Cardiology evaluated patient, recommended LHC 5/7: LHC unfortunately shows 90% ostial to mid left main stenosis   Consultants: Cardiology   Procedures: Cardiac catheterization. PCI to left main.    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 Known LM disease not amenable to CABG.  LHC shows progressed disease.  NSTEMI ruled out. Cardiology was following.  Patient underwent PCI to left main on 5/8.  Seems to have tolerated the procedure well.  Cardiology recommends dual antiplatelet treatment for 1 year.  Currently noted to be on aspirin and Plavix.  Patient also on statin beta-blocker and nitrates.    Chronic kidney disease stage IIIb Baseline since last July appears to be 1.1-1.5 Seems to be close to baseline.     Hypothyroidism Continue levothyroxine   Fibromyalgia Continue duloxetine and sertraline   Rheumatoid arthritis (HCC) Old diagnosis, seems quiescent.  Saw Rheum a long time ago, not currently on DMARD.   IPF (idiopathic pulmonary fibrosis) (HCC) HRCT in July 2023 with some UIP, probably RA related.     PAD (peripheral artery disease) (HCC) Stable   CAD (coronary artery disease) See above   Essential hypertension Occasional elevated blood pressure readings noted.  Continue amlodipine, Imdur, metoprolol   Hyperlipidemia Continue Crestor 40   Obesity Estimated body mass index is 32.03 kg/m as calculated from the following:   Height as of this encounter: 4\' 11"  (1.499 m).   Weight as of this encounter: 71.9 kg.   Patient feels well this morning.  Cleared by cardiology for discharge.  Okay for discharge home today.   PERTINENT LABS:  The results of significant diagnostics from this hospitalization (including imaging, microbiology, ancillary and laboratory) are listed below for reference.     Labs:   Basic Metabolic Panel: Recent Labs  Lab 11/16/22 1645 11/17/22 0223 11/18/22 0245 11/19/22 0200 11/19/22 1916 11/20/22 0333  NA 135 135 137 136  --  137  K 4.0 3.3* 3.9 3.8  --  3.8  CL 101 102 105 108  --  108  CO2 23 25 22  21*  --  23  GLUCOSE 111* 102* 129* 113*  --  105*  BUN 18 15 22 23   --  18  CREATININE 1.09* 1.11* 1.36* 1.28* 1.13* 1.26*  CALCIUM 9.1 8.6* 8.7*  8.4*  --  8.4*   Liver Function Tests: Recent Labs  Lab 11/17/22 0223  AST 17  ALT 13  ALKPHOS 64  BILITOT 0.5  PROT 6.3*  ALBUMIN 3.5    CBC: Recent Labs  Lab 11/17/22 0223 11/18/22 0245 11/19/22 0200 11/19/22 1916 11/20/22 0333  WBC 6.9 5.4 5.1 4.5 4.2  HGB 12.8 13.1 12.2 12.8 11.6*  HCT 38.9 38.4 36.5 38.2 34.3*  MCV 95.6 94.3 95.3 94.8 93.7  PLT 145* 137*  129* 143* 129*      IMAGING STUDIES CARDIAC CATHETERIZATION  Result Date: 11/19/2022   Mid Cx lesion is 20% stenosed.   Mid Cx to Dist Cx lesion is 70% stenosed.   Prox Cx lesion is 60% stenosed.   Ost LM to Mid LM lesion is 90% stenosed.   A drug-eluting stent was successfully placed using a STENT MEGATRON 3.5X8.   Post intervention, there is a 10% residual stenosis. Successful IVUS guided high risk rotational atherectomy and drug-eluting stent placement to the ostial left main coronary artery extending into the distal segment just before the bifurcation.  Difficult procedure overall due to heavy eccentric calcifications and short left main. The patient had chest pain and elevated blood pressure with balloon inflations and thus she was started on nitroglycerin drip with resolution of symptoms. Recommendations: Dual antiplatelet therapy for at least 1 year. Aggressive treatment of risk factors. Remove venous sheath once ACT is below 200.   CARDIAC CATHETERIZATION  Result Date: 11/18/2022   Ost LM to Mid LM lesion is 90% stenosed.   Mid Cx lesion is 20% stenosed.   Mid Cx to Dist Cx lesion is 70% stenosed.  Prox Cx lesion is 60% stenosed.   Ost RCA to Prox RCA lesion is 20% stenosed.   Prox RCA to Mid RCA lesion is 30% stenosed.   Prox RCA lesion is 60% stenosed.   RPDA lesion is 100% stenosed. fills via L-R collaterals   RPAV lesion is 60% stenosed.   LV end diastolic pressure is moderately elevated.   There is no aortic valve stenosis. POST-CATH DIAGNOSES Relatively stable elliptical calcified 90% Left Main stenosis with significant catheter dampening during engagement pressures normalized the pressures in the 80s with significant anginal chest pain Progression of proximal RCA disease but otherwise stable disease elsewhere. Significant pressure gradient in the innominate this up to right subclavian artery with tandem lesions leading to a 40 to 50 mmHg gradient. RECOMMENDATIONS Will discuss with ICU team  determine best course of action, would likely plan unsupported left main PCI with either atherectomy versus shockwave lithotripsy and IVUS guidance. Would likely load with Plavix once the plan for PCI is made. Bryan Lemma, MD  ECHOCARDIOGRAM COMPLETE  Result Date: 11/17/2022    ECHOCARDIOGRAM REPORT   Patient Name:   [name] Date of Exam: 11/17/2022 Medical Rec #:  [id number]      Height:       59.0 in Accession #:    [id number]     Weight:       150.0 lb Date of Birth:  [birth year]      BSA:          1.632 m Patient Age:    71 years       BP:           133/50 mmHg Patient Gender: F              HR:  65 bpm. Exam Location:  Inpatient Procedure: 2D Echo, Cardiac Doppler and Color Doppler Indications:    Chest Pain  History:        Patient has prior history of Echocardiogram examinations, most                 recent 09/19/2021. CAD, Signs/Symptoms:Shortness of Breath; Risk                 Factors:Hypertension.  Sonographer:    Raeford Razor Sonographer#2:  Milbert Coulter Referring Phys: Yvonna Alanis, L  Sonographer Comments: Image acquisition challenging due to patient body habitus. IMPRESSIONS  1. Left ventricular ejection fraction, by estimation, is 55 to 60%. The left ventricle has normal function. The left ventricle has no regional wall motion abnormalities. There is mild concentric left ventricular hypertrophy. Left ventricular diastolic parameters are consistent with Grade I diastolic dysfunction (impaired relaxation).  2. Right ventricular systolic function is normal. The right ventricular size is normal. There is normal pulmonary artery systolic pressure. The estimated right ventricular systolic pressure is 19.2 mmHg.  3. The mitral valve is normal in structure. Trivial mitral valve regurgitation. No evidence of mitral stenosis.  4. The aortic valve is tricuspid. There is mild calcification of the aortic valve. Aortic valve regurgitation is not visualized. No aortic stenosis is present.  5. The  inferior vena cava is normal in size with greater than 50% respiratory variability, suggesting right atrial pressure of 3 mmHg. FINDINGS  Left Ventricle: Left ventricular ejection fraction, by estimation, is 55 to 60%. The left ventricle has normal function. The left ventricle has no regional wall motion abnormalities. The left ventricular internal cavity size was normal in size. There is  mild concentric left ventricular hypertrophy. Left ventricular diastolic parameters are consistent with Grade I diastolic dysfunction (impaired relaxation). Right Ventricle: The right ventricular size is normal. No increase in right ventricular wall thickness. Right ventricular systolic function is normal. There is normal pulmonary artery systolic pressure. The tricuspid regurgitant velocity is 2.01 m/s, and  with an assumed right atrial pressure of 3 mmHg, the estimated right ventricular systolic pressure is 19.2 mmHg. Left Atrium: Left atrial size was normal in size. Right Atrium: Right atrial size was normal in size. Pericardium: Trivial pericardial effusion is present. Mitral Valve: The mitral valve is normal in structure. There is mild calcification of the mitral valve leaflet(s). Mild mitral annular calcification. Trivial mitral valve regurgitation. No evidence of mitral valve stenosis. Tricuspid Valve: The tricuspid valve is normal in structure. Tricuspid valve regurgitation is trivial. Aortic Valve: The aortic valve is tricuspid. There is mild calcification of the aortic valve. Aortic valve regurgitation is not visualized. No aortic stenosis is present. Aortic valve peak gradient measures 5.8 mmHg. Pulmonic Valve: The pulmonic valve was normal in structure. Pulmonic valve regurgitation is not visualized. Aorta: The aortic root is normal in size and structure. Venous: The inferior vena cava is normal in size with greater than 50% respiratory variability, suggesting right atrial pressure of 3 mmHg. IAS/Shunts: No atrial  level shunt detected by color flow Doppler.  LEFT VENTRICLE PLAX 2D LVIDd:         3.80 cm     Diastology LVIDs:         2.70 cm     LV e' medial:    6.74 cm/s LV PW:         1.20 cm     LV E/e' medial:  12.2 LV IVS:  1.10 cm     LV e' lateral:   6.85 cm/s LVOT diam:     1.70 cm     LV E/e' lateral: 12.0 LV SV:         52 LV SV Index:   32 LVOT Area:     2.27 cm  LV Volumes (MOD) LV vol d, MOD A4C: 71.7 ml LV vol s, MOD A4C: 30.2 ml LV SV MOD A4C:     71.7 ml RIGHT VENTRICLE             IVC RV Basal diam:  2.40 cm     IVC diam: 1.40 cm RV S prime:     11.20 cm/s TAPSE (M-mode): 2.9 cm LEFT ATRIUM             Index        RIGHT ATRIUM           Index LA diam:        4.30 cm 2.63 cm/m   RA Area:     15.40 cm LA Vol (A2C):   35.2 ml 21.57 ml/m  RA Volume:   42.10 ml  25.79 ml/m LA Vol (A4C):   28.7 ml 17.58 ml/m LA Biplane Vol: 32.6 ml 19.97 ml/m  AORTIC VALVE AV Area (Vmax): 1.69 cm AV Vmax:        120.00 cm/s AV Peak Grad:   5.8 mmHg LVOT Vmax:      89.60 cm/s LVOT Vmean:     62.400 cm/s LVOT VTI:       0.227 m  AORTA Ao Root diam: 2.60 cm Ao Asc diam:  2.60 cm MITRAL VALVE               TRICUSPID VALVE MV Area (PHT): 2.95 cm    TR Peak grad:   16.2 mmHg MV Decel Time: 257 msec    TR Vmax:        201.00 cm/s MV E velocity: 82.30 cm/s MV A velocity: 91.70 cm/s  SHUNTS MV E/A ratio:  0.90        Systemic VTI:  0.23 m                            Systemic Diam: 1.70 cm Dalton McleanMD Electronically signed by Wilfred Lacy Signature Date/Time: 11/17/2022/4:26:42 PM    Final    DG Chest Port 1 View  Result Date: 11/16/2022 CLINICAL DATA:  Chest pain. EXAM: PORTABLE CHEST 1 VIEW COMPARISON:  CT chest dated January 13, 2022 FINDINGS: The heart is enlarged on this AP projection. Prominent aortic atherosclerotic calcifications. Lungs are clear without evidence of focal consolidation or pleural effusion. Partially imaged anterior cervical discectomy and fusion hardware. No acute osseous abnormality. IMPRESSION:  Cardiomegaly. No acute cardiopulmonary process. Electronically Signed   By: Larose Hires D.O.   On: 11/16/2022 17:18    DISCHARGE EXAMINATION: See progress note from earlier today  DISPOSITION: Home  Discharge Instructions     Amb Referral to Cardiac Rehabilitation   Complete by: As directed    Diagnosis: Coronary Stents   After initial evaluation and assessments completed: Virtual Based Care may be provided alone or in conjunction with Phase 2 Cardiac Rehab based on patient barriers.: Yes   Intensive Cardiac Rehabilitation (ICR) [facility] location only OR Traditional Cardiac Rehabilitation (TCR) *If criteria for ICR are not met will enroll in TCR [facility] only): Yes   Call MD for:  difficulty breathing, headache  or visual disturbances   Complete by: As directed    Call MD for:  extreme fatigue   Complete by: As directed    Call MD for:  persistant dizziness or light-headedness   Complete by: As directed    Call MD for:  persistant nausea and vomiting   Complete by: As directed    Call MD for:  severe uncontrolled pain   Complete by: As directed    Call MD for:  temperature >100.4   Complete by: As directed    Diet - low sodium heart healthy   Complete by: As directed    Discharge instructions   Complete by: As directed    Please take your medications as prescribed.  Cardiology will arrange outpatient follow-up.  You were cared for by a hospitalist during your hospital stay. If you have any questions about your discharge medications or the care you received while you were in the hospital after you are discharged, you can call the unit and asked to speak with the hospitalist on call if the hospitalist that took care of you is not available. Once you are discharged, your primary care physician will handle any further medical issues. Please note that NO REFILLS for any discharge medications will be authorized once you are discharged, as it is imperative that you return to your primary care  physician (or establish a relationship with a primary care physician if you do not have one) for your aftercare needs so that they can reassess your need for medications and monitor your lab values. If you do not have a primary care physician, you can call [phone] for a physician referral.   Increase activity slowly   Complete by: As directed           Allergies as of 11/20/2022       Reactions   Celecoxib Itching, Other (See Comments)   Other Reaction(s): Not available   Citalopram Other (See Comments), Palpitations   Heart rate increased   Morphine And Related Other (See Comments), Itching   Patient states she is not allergic to morphine; states had morphine with no reaction   Ciprofloxacin Hcl Other (See Comments)   Mya have made her sick or bad diarrhea   Dicyclomine Diarrhea, Other (See Comments)   Made diarrhea worse   Lamotrigine Other (See Comments), Diarrhea   Unknown   Metoclopramide Other (See Comments)   Dizziness   Milnacipran Hcl Other (See Comments)   Unknown   Omeprazole Other (See Comments)   Pregabalin Other (See Comments)   Serotonin Reuptake Inhibitors (ssris) Other (See Comments)   Simvastatin Other (See Comments)   Increased back and muscle pain   Tizanidine Other (See Comments)   unknown   Trazodone And Nefazodone Other (See Comments)   hallucinations   Venlafaxine Other (See Comments)   Affected eyes, blurred vision   Codeine Itching   Morphine Other (See Comments)   Nefazodone Other (See Comments)   Tape Other (See Comments)   Skin gets red and skin pulls off, paper tape only   Sertraline Hcl Other (See Comments)   Stomach upset. Pt takes this med at home 11/2022        Medication List     STOP taking these medications    Belsomra 10 MG Tabs Generic drug: Suvorexant   diazepam 5 MG tablet Commonly known as: VALIUM   Turmeric 500 MG Caps       TAKE these medications    albuterol 108 (90  Base) MCG/ACT inhaler Commonly known  as: VENTOLIN HFA 1 puff 4 (four) times daily as needed for wheezing or shortness of breath.   ALIVE WOMENS GUMMY PO Take 1 tablet by mouth daily. gummy   ALPRAZolam 0.5 MG tablet Commonly known as: XANAX Take 0.5 mg by mouth 3 (three) times daily as needed.   amLODipine 10 MG tablet Commonly known as: NORVASC Take 1 tablet (10 mg total) by mouth daily. What changed:  medication strength See the new instructions.   aspirin EC 81 MG tablet Take 81 mg by mouth daily.   CALCIUM PO Take 1-2 tablets by mouth daily. Gummy   clopidogrel 75 MG tablet Commonly known as: PLAVIX Take 1 tablet (75 mg total) by mouth daily.   cyanocobalamin 1000 MCG tablet Commonly known as: VITAMIN B12 Take 2 tablets (2,000 mcg total) by mouth daily. What changed: how much to take   diclofenac Sodium 1 % Gel Commonly known as: VOLTAREN Apply 2 g topically 3 (three) times daily.   DULoxetine 20 MG capsule Commonly known as: CYMBALTA Take 20 mg by mouth at bedtime.   EPINEPHrine 0.15 MG/0.3ML injection Commonly known as: EPIPEN JR Inject 0.15 mg into the muscle as needed for anaphylaxis.   isosorbide mononitrate 60 MG 24 hr tablet Commonly known as: IMDUR Take 1 tablet (60 mg total) by mouth daily. What changed:  medication strength how much to take   levothyroxine 25 MCG tablet Commonly known as: SYNTHROID Take 25 mcg by mouth daily before breakfast.   metoprolol succinate 100 MG 24 hr tablet Commonly known as: TOPROL-XL Take 1 tablet (100 mg total) by mouth daily. Take with or immediately following a meal. What changed:  medication strength how much to take additional instructions   nitroGLYCERIN 0.4 MG SL tablet Commonly known as: NITROSTAT Place 1 tablet (0.4 mg total) under the tongue every 5 (five) minutes as needed for chest pain. Notes to patient: The proper use and anticipated side effects of nitroglycerine has been carefully explained.  If a single episode of chest pain is  not relieved by one tablet, the patient will try another within 5 minutes; and if this doesn't relieve the pain, the patient is instructed to call 911 for transportation to an emergency department.   Omega-3 1000 MG Caps Take 1,000 mg by mouth daily.   ondansetron 4 MG tablet Commonly known as: ZOFRAN Take 4 mg by mouth 3 (three) times daily as needed for nausea.   oxyCODONE-acetaminophen 5-325 MG tablet Commonly known as: PERCOCET/ROXICET Take 1 tablet by mouth 3 (three) times daily as needed for moderate pain or severe pain.   pantoprazole 40 MG tablet Commonly known as: PROTONIX Take 40 mg by mouth daily.   rosuvastatin 40 MG tablet Commonly known as: CRESTOR Take 1 tablet (40 mg total) by mouth at bedtime. Please call [phone] to schedule an appointment for future refills. Thank you.   sertraline 50 MG tablet Commonly known as: ZOLOFT Take 50 mg by mouth daily.   Vitamin D (Ergocalciferol) 1.25 MG (50000 UNIT) Caps capsule Commonly known as: DRISDOL Take 50,000 Units by mouth every Monday.   zinc gluconate 50 MG tablet Take 50 mg by mouth daily.           TOTAL DISCHARGE TIME: 35 minutes  Gwendolyn Nishi [employer] on [email or web address]  11/21/2022, 10:39 AM

## 2022-11-20 NOTE — Plan of Care (Signed)

## 2022-11-20 NOTE — Progress Notes (Signed)
[facility] PROGRESS NOTE   Heather THERIEN [name]:[id number] DOB: 06-10-1951 DOA: 11/16/2022  PCP: Ignatius Specking, MD  Brief History/Interval Summary: 72 y.o. 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 In the ER, troponins elevated    5/5: Admitted, started on heparin and transferred to [facility] 5/6: Cardiology evaluated patient, recommended LHC 5/7: LHC unfortunately shows 90% ostial to mid left main stenosis   Consultants: Cardiology  Procedures: Cardiac catheterization. PCI to left main.    Subjective/Interval History: Patient feels well.  Denies any chest pain shortness of breath.  Slept well.  Wants to go home.     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 Known LM disease not amenable to CABG.  LHC shows progressed disease.  NSTEMI ruled out. Cardiology was following.  Patient underwent PCI to left main on 5/8.  Seems to have tolerated the procedure well.  She does have some bruising in the right groin area.  Cardiology to evaluate. She did have chest pain during the procedure yesterday requiring nitroglycerin.  She is off of the nitroglycerin infusion at this time. Cardiology recommends dual antiplatelet treatment for 1 year.  Currently noted to be on aspirin and Plavix.  Patient also on statin beta-blocker and nitrates.   Chronic kidney disease stage IIIb Baseline since last July appears to be 1.1-1.5 Seems to be close to baseline.  Monitor urine output.  Avoid nephrotoxic agents.   Hypothyroidism Continue levothyroxine   Fibromyalgia Continue duloxetine and sertraline   Rheumatoid arthritis (HCC) Old diagnosis, seems  quiescent.  Saw Rheum a long time ago, not currently on DMARD.   IPF (idiopathic pulmonary fibrosis) (HCC) HRCT in July 2023 with some UIP, probably RA related.     PAD (peripheral artery disease) (HCC) Stable   CAD (coronary artery disease) See above   Essential hypertension Occasional elevated blood pressure readings noted.  Continue amlodipine, Imdur, metoprolol   Hyperlipidemia Continue Crestor 40  Obesity Estimated body mass index is 32.03 kg/m as calculated from the following:   Height as of this encounter: 4\' 11"  (1.499 m).   Weight as of this encounter: 71.9 kg.  DVT Prophylaxis: On IV heparin Code Status: Full code Family Communication: Discussed with patient Disposition Plan: Home when cleared by cardiology  Status is: Inpatient Remains inpatient appropriate because: Angina with coronary artery disease      Medications: Scheduled:  alum & mag hydroxide-simeth  30 mL Oral Once   amLODipine  10 mg Oral Daily   aspirin EC  81 mg Oral Daily   clopidogrel  75 mg Oral Daily   DULoxetine  20 mg Oral QHS   enoxaparin (LOVENOX) injection  40 mg Subcutaneous Q24H   isosorbide mononitrate  60 mg Oral Daily   levothyroxine  25 mcg Oral Q0600   metoprolol succinate  100 mg Oral Daily   pantoprazole  40 mg Oral Daily   rosuvastatin  40 mg Oral QHS   sertraline  50 mg Oral Daily   sodium chloride flush  3 mL Intravenous Q12H   sodium chloride flush  3 mL Intravenous Q12H   sodium chloride flush  3 mL Intravenous Q12H   Continuous:  sodium chloride     sodium chloride  sodium chloride     nitroGLYCERIN Stopped (11/19/22 2134)   RUE:AVWUJW chloride, sodium chloride, sodium chloride, acetaminophen, ALPRAZolam, fentaNYL (SUBLIMAZE) injection, naLOXone (NARCAN)  injection, nitroGLYCERIN, ondansetron **OR** ondansetron (ZOFRAN) IV, oxyCODONE-acetaminophen, sodium chloride flush, sodium chloride flush, sodium chloride flush  Antibiotics: Anti-infectives (From  admission, onward)    None       Objective:  Vital Signs  Vitals:   11/19/22 1924 11/20/22 0355 11/20/22 0837 11/20/22 0839  BP: (!) 125/59 (!) 131/42 (!) 134/39   Pulse: (!) 58 60  66  Resp: 18     Temp: 97.9 F (36.6 C) 98 F (36.7 C)    TempSrc: Oral Oral    SpO2: 96% 94%    Weight:      Height:        Intake/Output Summary (Last 24 hours) at 11/20/2022 1054 Last data filed at 11/20/2022 0245 Gross per 24 hour  Intake 935.23 ml  Output --  Net 935.23 ml    Filed Weights   11/16/22 1640 11/19/22 0408  Weight: 68 kg 71.9 kg    General appearance: Awake alert.  In no distress Resp: Clear to auscultation bilaterally.  Normal effort Cardio: S1-S2 is normal regular.  No S3-S4.  No rubs murmurs or bruit GI: Abdomen is soft.  Nontender nondistended.  Bowel sounds are present normal.  No masses organomegaly Extremities: No edema.  Full range of motion of lower extremities.    Lab Results:  Data Reviewed: I have personally reviewed following labs and reports of the imaging studies  CBC: Recent Labs  Lab 11/17/22 0223 11/18/22 0245 11/19/22 0200 11/19/22 1916 11/20/22 0333  WBC 6.9 5.4 5.1 4.5 4.2  HGB 12.8 13.1 12.2 12.8 11.6*  HCT 38.9 38.4 36.5 38.2 34.3*  MCV 95.6 94.3 95.3 94.8 93.7  PLT 145* 137* 129* 143* 129*     Basic Metabolic Panel: Recent Labs  Lab 11/16/22 1645 11/17/22 0223 11/18/22 0245 11/19/22 0200 11/19/22 1916 11/20/22 0333  NA 135 135 137 136  --  137  K 4.0 3.3* 3.9 3.8  --  3.8  CL 101 102 105 108  --  108  CO2 23 25 22  21*  --  23  GLUCOSE 111* 102* 129* 113*  --  105*  BUN 18 15 22 23   --  18  CREATININE 1.09* 1.11* 1.36* 1.28* 1.13* 1.26*  CALCIUM 9.1 8.6* 8.7* 8.4*  --  8.4*     GFR: Estimated Creatinine Clearance: 34.3 mL/min (A) (by C-G formula based on SCr of 1.26 mg/dL (H)).  Liver Function Tests: Recent Labs  Lab 11/17/22 0223  AST 17  ALT 13  ALKPHOS 64  BILITOT 0.5  PROT 6.3*  ALBUMIN 3.5      Coagulation Profile: Recent Labs  Lab 11/16/22 1813  INR 1.0     Lipid Profile: Recent Labs    11/18/22 0245  CHOL 124  HDL 50  LDLCALC 44  TRIG 149  CHOLHDL 2.5      Radiology Studies: CARDIAC CATHETERIZATION  Result Date: 11/19/2022   Mid Cx lesion is 20% stenosed.   Mid Cx to Dist Cx lesion is 70% stenosed.   Prox Cx lesion is 60% stenosed.   Ost LM to Mid LM lesion is 90% stenosed.   A drug-eluting stent was successfully placed using a STENT MEGATRON 3.5X8.   Post intervention, there is a 10% residual stenosis. Successful IVUS guided high risk rotational atherectomy and drug-eluting stent placement to the ostial left main coronary  artery extending into the distal segment just before the bifurcation.  Difficult procedure overall due to heavy eccentric calcifications and short left main. The patient had chest pain and elevated blood pressure with balloon inflations and thus she was started on nitroglycerin drip with resolution of symptoms. Recommendations: Dual antiplatelet therapy for at least 1 year. Aggressive treatment of risk factors. Remove venous sheath once ACT is below 200.       LOS: 4 days   Alleen Kehm [employer] on [email or web address]  11/20/2022, 10:54 AM

## 2022-11-20 NOTE — Progress Notes (Addendum)
Rounding Note    Patient Name: [name] Date of Encounter: 11/20/2022  [facility] Cardiologist: Nona Dell, MD   Subjective   No chest pain, ready to go home this morning  Inpatient Medications    Scheduled Meds:  alum & mag hydroxide-simeth  30 mL Oral Once   amLODipine  10 mg Oral Daily   aspirin EC  81 mg Oral Daily   clopidogrel  75 mg Oral Daily   DULoxetine  20 mg Oral QHS   enoxaparin (LOVENOX) injection  40 mg Subcutaneous Q24H   isosorbide mononitrate  60 mg Oral Daily   levothyroxine  25 mcg Oral Q0600   metoprolol succinate  100 mg Oral Daily   pantoprazole  40 mg Oral Daily   rosuvastatin  40 mg Oral QHS   sertraline  50 mg Oral Daily   sodium chloride flush  3 mL Intravenous Q12H   sodium chloride flush  3 mL Intravenous Q12H   sodium chloride flush  3 mL Intravenous Q12H   Continuous Infusions:  sodium chloride     sodium chloride     sodium chloride     nitroGLYCERIN Stopped (11/19/22 2134)   PRN Meds: sodium chloride, sodium chloride, sodium chloride, acetaminophen, ALPRAZolam, fentaNYL (SUBLIMAZE) injection, naLOXone (NARCAN)  injection, nitroGLYCERIN, ondansetron **OR** ondansetron (ZOFRAN) IV, oxyCODONE-acetaminophen, sodium chloride flush, sodium chloride flush, sodium chloride flush   Vital Signs    Vitals:   11/19/22 1924 11/20/22 0355 11/20/22 0837 11/20/22 0839  BP: (!) 125/59 (!) 131/42 (!) 134/39   Pulse: (!) 58 60  66  Resp: 18     Temp: 97.9 F (36.6 C) 98 F (36.7 C)    TempSrc: Oral Oral    SpO2: 96% 94%    Weight:      Height:        Intake/Output Summary (Last 24 hours) at 11/20/2022 0851 Last data filed at 11/20/2022 0245 Gross per 24 hour  Intake 935.23 ml  Output --  Net 935.23 ml      11/19/2022    4:08 AM 11/16/2022    4:40 PM 10/06/2022   11:05 AM  Last 3 Weights  Weight (lbs) 158 lb 9.6 oz 150 lb 162 lb  Weight (kg) 71.94 kg 68.04 kg 73.483 kg      Telemetry    Sinus Rhythm - Personally  Reviewed  ECG    Sinus Bradycardia, 52 bpm - Personally Reviewed  Physical Exam   GEN: No acute distress.   Neck: No JVD Cardiac: RRR, no murmurs, rubs, or gallops.  Respiratory: Clear to auscultation bilaterally. GI: Soft, nontender, non-distended  MS: No edema; No deformity. Right radial cath site stable Neuro:  Nonfocal  Psych: Normal affect   Labs    High Sensitivity Troponin:   Recent Labs  Lab 11/16/22 1645 11/16/22 1841  TROPONINIHS 132* 129*     Chemistry Recent Labs  Lab 11/17/22 0223 11/18/22 0245 11/19/22 0200 11/19/22 1916 11/20/22 0333  NA 135 137 136  --  137  K 3.3* 3.9 3.8  --  3.8  CL 102 105 108  --  108  CO2 25 22 21*  --  23  GLUCOSE 102* 129* 113*  --  105*  BUN 15 22 23   --  18  CREATININE 1.11* 1.36* 1.28* 1.13* 1.26*  CALCIUM 8.6* 8.7* 8.4*  --  8.4*  PROT 6.3*  --   --   --   --   ALBUMIN 3.5  --   --   --   --  AST 17  --   --   --   --   ALT 13  --   --   --   --   ALKPHOS 64  --   --   --   --   BILITOT 0.5  --   --   --   --   GFRNONAA 52* 41* 44* 51* 45*  ANIONGAP 8 10 7   --  6    Lipids  Recent Labs  Lab 11/18/22 0245  CHOL 124  TRIG 149  HDL 50  LDLCALC 44  CHOLHDL 2.5    Hematology Recent Labs  Lab 11/19/22 0200 11/19/22 1916 11/20/22 0333  WBC 5.1 4.5 4.2  RBC 3.83* 4.03 3.66*  HGB 12.2 12.8 11.6*  HCT 36.5 38.2 34.3*  MCV 95.3 94.8 93.7  MCH 31.9 31.8 31.7  MCHC 33.4 33.5 33.8  RDW 13.1 13.0 13.0  PLT 129* 143* 129*   Thyroid No results for input(s): "TSH", "FREET4" in the last 168 hours.  BNPNo results for input(s): "BNP", "PROBNP" in the last 168 hours.  DDimer No results for input(s): "DDIMER" in the last 168 hours.   Radiology    CARDIAC CATHETERIZATION  Result Date: 11/19/2022   Mid Cx lesion is 20% stenosed.   Mid Cx to Dist Cx lesion is 70% stenosed.   Prox Cx lesion is 60% stenosed.   Ost LM to Mid LM lesion is 90% stenosed.   A drug-eluting stent was successfully placed using a STENT  MEGATRON 3.5X8.   Post intervention, there is a 10% residual stenosis. Successful IVUS guided high risk rotational atherectomy and drug-eluting stent placement to the ostial left main coronary artery extending into the distal segment just before the bifurcation.  Difficult procedure overall due to heavy eccentric calcifications and short left main. The patient had chest pain and elevated blood pressure with balloon inflations and thus she was started on nitroglycerin drip with resolution of symptoms. Recommendations: Dual antiplatelet therapy for at least 1 year. Aggressive treatment of risk factors. Remove venous sheath once ACT is below 200.   CARDIAC CATHETERIZATION  Result Date: 11/18/2022   Ost LM to Mid LM lesion is 90% stenosed.   Mid Cx lesion is 20% stenosed.   Mid Cx to Dist Cx lesion is 70% stenosed.  Prox Cx lesion is 60% stenosed.   Ost RCA to Prox RCA lesion is 20% stenosed.   Prox RCA to Mid RCA lesion is 30% stenosed.   Prox RCA lesion is 60% stenosed.   RPDA lesion is 100% stenosed. fills via L-R collaterals   RPAV lesion is 60% stenosed.   LV end diastolic pressure is moderately elevated.   There is no aortic valve stenosis. POST-CATH DIAGNOSES Relatively stable elliptical calcified 90% Left Main stenosis with significant catheter dampening during engagement pressures normalized the pressures in the 80s with significant anginal chest pain Progression of proximal RCA disease but otherwise stable disease elsewhere. Significant pressure gradient in the innominate this up to right subclavian artery with tandem lesions leading to a 40 to 50 mmHg gradient. RECOMMENDATIONS Will discuss with ICU team determine best course of action, would likely plan unsupported left main PCI with either atherectomy versus shockwave lithotripsy and IVUS guidance. Would likely load with Plavix once the plan for PCI is made. Bryan Lemma, MD   Cardiac Studies   Echo: 11/17/2022   IMPRESSIONS     1. Left  ventricular ejection fraction, by estimation, is 55 to 60%. The  left ventricle has normal function. The left ventricle has no regional  wall motion abnormalities. There is mild concentric left ventricular  hypertrophy. Left ventricular diastolic  parameters are consistent with Grade I diastolic dysfunction (impaired  relaxation).   2. Right ventricular systolic function is normal. The right ventricular  size is normal. There is normal pulmonary artery systolic pressure. The  estimated right ventricular systolic pressure is 19.2 mmHg.   3. The mitral valve is normal in structure. Trivial mitral valve  regurgitation. No evidence of mitral stenosis.   4. The aortic valve is tricuspid. There is mild calcification of the  aortic valve. Aortic valve regurgitation is not visualized. No aortic  stenosis is present.   5. The inferior vena cava is normal in size with greater than 50%  respiratory variability, suggesting right atrial pressure of 3 mmHg.   FINDINGS   Left Ventricle: Left ventricular ejection fraction, by estimation, is 55  to 60%. The left ventricle has normal function. The left ventricle has no  regional wall motion abnormalities. The left ventricular internal cavity  size was normal in size. There is   mild concentric left ventricular hypertrophy. Left ventricular diastolic  parameters are consistent with Grade I diastolic dysfunction (impaired  relaxation).   Right Ventricle: The right ventricular size is normal. No increase in  right ventricular wall thickness. Right ventricular systolic function is  normal. There is normal pulmonary artery systolic pressure. The tricuspid  regurgitant velocity is 2.01 m/s, and   with an assumed right atrial pressure of 3 mmHg, the estimated right  ventricular systolic pressure is 19.2 mmHg.   Left Atrium: Left atrial size was normal in size.   Right Atrium: Right atrial size was normal in size.   Pericardium: Trivial pericardial  effusion is present.   Mitral Valve: The mitral valve is normal in structure. There is mild  calcification of the mitral valve leaflet(s). Mild mitral annular  calcification. Trivial mitral valve regurgitation. No evidence of mitral  valve stenosis.   Tricuspid Valve: The tricuspid valve is normal in structure. Tricuspid  valve regurgitation is trivial.   Aortic Valve: The aortic valve is tricuspid. There is mild calcification  of the aortic valve. Aortic valve regurgitation is not visualized. No  aortic stenosis is present. Aortic valve peak gradient measures 5.8 mmHg.   Pulmonic Valve: The pulmonic valve was normal in structure. Pulmonic valve  regurgitation is not visualized.   Aorta: The aortic root is normal in size and structure.   Venous: The inferior vena cava is normal in size with greater than 50%  respiratory variability, suggesting right atrial pressure of 3 mmHg.   IAS/Shunts: No atrial level shunt detected by color flow Doppler.    Cath: 11/18/2022     Ost LM to Mid LM lesion is 90% stenosed.   Mid Cx lesion is 20% stenosed.   Mid Cx to Dist Cx lesion is 70% stenosed.  Prox Cx lesion is 60% stenosed.   Ost RCA to Prox RCA lesion is 20% stenosed.   Prox RCA to Mid RCA lesion is 30% stenosed.   Prox RCA lesion is 60% stenosed.   RPDA lesion is 100% stenosed. fills via L-R collaterals   RPAV lesion is 60% stenosed.   LV end diastolic pressure is moderately elevated.   There is no aortic valve stenosis.   POST-CATH DIAGNOSES Relatively stable elliptical calcified 90% Left Main stenosis with significant catheter dampening during engagement pressures normalized the pressures in the 80s with  significant anginal chest pain Progression of proximal RCA disease but otherwise stable disease elsewhere. Significant pressure gradient in the innominate this up to right subclavian artery with tandem lesions leading to a 40 to 50 mmHg gradient.   RECOMMENDATIONS Will discuss with  ICU team determine best course of action, would likely plan unsupported left main PCI with either atherectomy versus shockwave lithotripsy and IVUS guidance. Would likely load with Plavix once the plan for PCI is made.   Bryan Lemma, MD   Diagnostic Dominance: Right   Cath: 11/19/2022    Mid Cx lesion is 20% stenosed.   Mid Cx to Dist Cx lesion is 70% stenosed.   Prox Cx lesion is 60% stenosed.   Ost LM to Mid LM lesion is 90% stenosed.   A drug-eluting stent was successfully placed using a STENT MEGATRON 3.5X8.   Post intervention, there is a 10% residual stenosis.   Successful IVUS guided high risk rotational atherectomy and drug-eluting stent placement to the ostial left main coronary artery extending into the distal segment just before the bifurcation.  Difficult procedure overall due to heavy eccentric calcifications and short left main. The patient had chest pain and elevated blood pressure with balloon inflations and thus she was started on nitroglycerin drip with resolution of symptoms.   Recommendations: Dual antiplatelet therapy for at least 1 year. Aggressive treatment of risk factors. Remove venous sheath once ACT is below 200.  Diagnostic Dominance: Right  Intervention    Patient Profile     72 y.o. 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 Maryfrances Bunnell.   Assessment & Plan    Chest pain CAD with severe multivessel disease (L main 90%, Lcx 70%, PDA 90% and prior history of DES to RCA and Lcx) -- Patient presented with progressive angina, dyspnea with associated weakness, diaphoresis, numbness, and tingling. EKG without acute ischemia. Troponin 132->129.  -- Patient previously seen by [facility] in 2023 and deemed  not a surgical candidate due to extensive aortic root/arch calcifications. Off-pump LIMA to LAD was also not an option due to subclavian stenosis Given high risk of PCI, medical management recommended at that time.   -- LHC 5/7 showed Ost LM to Mid LM lesion is 90% stenosed.  Cath films reviewed with recommendations for PCI.  Underwent successful PCI/DES x 1 to ostial/mid left main lesion using IVUS guidance.  Difficult procedure due to heavy eccentric calcifications and short left main.  She did have chest pain and elevated blood pressures with balloon inflations which was treated with nitroglycerin. -- cardiac rehab to see -- Continue ASA 81mg , plavix, Toprol XL 100mg , Imdur 60mg  daily    Hypertension -- Continue amlodipine 10mg  daily, continue Toprol 100mg  daily, Imdur 60mg  daily    Hyperlipidemia -- continue Crestor 40mg  daily  Will arrange for outpatient follow up in the office  For questions or updates, please contact [facility] Please consult [email or web address] for contact info under        Signed, Laverda Page, NP  11/20/2022, 8:51 AM    I have examined the patient and reviewed assessment and plan and discussed with patient.  Agree with above as stated.    Right groin stable.  No hematoma.  Mild bruising.  Okay for discharge as long she does well walking with cardiac rehab.  Continue dual antiplatelet therapy along with aggressive secondary prevention.  Lance Muss

## 2022-11-20 NOTE — Plan of Care (Signed)

## 2022-12-01 ENCOUNTER — Ambulatory Visit: Payer: 59 | Attending: Nurse Practitioner | Admitting: Nurse Practitioner

## 2022-12-01 ENCOUNTER — Encounter: Payer: Self-pay | Admitting: Nurse Practitioner

## 2022-12-01 VITALS — BP 120/56 | HR 71 | Ht 59.0 in | Wt 162.6 lb

## 2022-12-01 DIAGNOSIS — Z79899 Other long term (current) drug therapy: Secondary | ICD-10-CM

## 2022-12-01 DIAGNOSIS — H43399 Other vitreous opacities, unspecified eye: Secondary | ICD-10-CM | POA: Diagnosis not present

## 2022-12-01 DIAGNOSIS — R600 Localized edema: Secondary | ICD-10-CM

## 2022-12-01 DIAGNOSIS — I10 Essential (primary) hypertension: Secondary | ICD-10-CM | POA: Diagnosis not present

## 2022-12-01 DIAGNOSIS — N183 Chronic kidney disease, stage 3 unspecified: Secondary | ICD-10-CM | POA: Diagnosis not present

## 2022-12-01 DIAGNOSIS — F419 Anxiety disorder, unspecified: Secondary | ICD-10-CM

## 2022-12-01 DIAGNOSIS — I739 Peripheral vascular disease, unspecified: Secondary | ICD-10-CM

## 2022-12-01 DIAGNOSIS — I6523 Occlusion and stenosis of bilateral carotid arteries: Secondary | ICD-10-CM

## 2022-12-01 DIAGNOSIS — I2510 Atherosclerotic heart disease of native coronary artery without angina pectoris: Secondary | ICD-10-CM

## 2022-12-01 DIAGNOSIS — E785 Hyperlipidemia, unspecified: Secondary | ICD-10-CM

## 2022-12-01 NOTE — Patient Instructions (Addendum)
Medication Instructions:   Continue all current medications.   Labwork:  BMET, CBC - orders given  Please do in 1 week  Office will contact with results via phone, letter or mychart.     Testing/Procedures:  none  Follow-Up:  As planned with Dr. Diona Browner   Any Other Special Instructions Will Be Listed Below (If Applicable).  You have been referred to:  Behavioral Health  If you need a refill on your cardiac medications before your next appointment, please call your pharmacy.

## 2022-12-01 NOTE — Progress Notes (Signed)
Office Visit    Patient Name: [name] Date of Encounter: 12/01/2022  PCP:  Ignatius Specking, MD   [facility]  Cardiologist:  Nona Dell, MD  Advanced Practice Provider:  No care team member to display Electrophysiologist:  None   Chief Complaint    [name] is a 72 y.o. female with a hx of CAD, carotid artery disease with severe RICA stenosis, IPF, HTN, renal artery stenosis, GERD, fibromyalgia, rheumatoid arthritis, hypothyroidism, CKD stage IIIb, and HLD, who presents today for PCI follow-up.    Past Medical History    Past Medical History:  Diagnosis Date   Abnormal chest CT    Anxiety    Arthritis    Rhumatoid and Osteoarthritis   Carotid artery disease (HCC)    Dr. Myra Gianotti, s/p left CEA, severe R ICA stenosis   Coronary atherosclerosis of native coronary artery    DES RCA/circumflex 4/10, LVEF 65 -> repeat cath 11/2021 with multivessel CAD including LM stenosis, initial med rx   Depression    Essential hypertension    Fibromyalgia    GERD (gastroesophageal reflux disease)    Hiatal hernia    Hyperlipidemia    Kidney cysts    NSTEMI (non-ST elevated myocardial infarction) (HCC)    4/10   Renal artery stenosis (HCC)    Left renal artery stent 12/13   Restless leg syndrome    Spondylosis without myelopathy    Tobacco abuse    Past Surgical History:  Procedure Laterality Date   ABDOMINAL HYSTERECTOMY  1977   BREAST LUMPECTOMY     CAROTID ENDARTERECTOMY Left 02/28/2009   CHOLECYSTECTOMY  1991   COLONOSCOPY     COLONOSCOPY N/A 05/08/2017   Procedure: COLONOSCOPY;  Surgeon: Malissa Hippo, MD;  [facility];  Service: Endoscopy;  Laterality: N/A;  12:00   CORONARY ATHERECTOMY N/A 11/19/2022   Procedure: CORONARY ATHERECTOMY;  Surgeon: Iran Ouch, MD;  [facility]: [facility];  Service: Cardiovascular;  Laterality: N/A;   CORONARY STENT INTERVENTION N/A 11/19/2022   Procedure: CORONARY STENT  INTERVENTION;  Surgeon: Iran Ouch, MD;  Location: [facility];  Service: Cardiovascular;  Laterality: N/A;   CORONARY ULTRASOUND/IVUS N/A 11/19/2022   Procedure: Coronary Ultrasound/IVUS;  Surgeon: Iran Ouch, MD;  [facility]: [facility];  Service: Cardiovascular;  Laterality: N/A;   ESOPHAGOGASTRODUODENOSCOPY N/A 04/07/2013   Procedure: ESOPHAGOGASTRODUODENOSCOPY (EGD);  Surgeon: Malissa Hippo, MD;  [facility]: [facility];  Service: Endoscopy;  Laterality: N/A;  250   ESOPHAGOGASTRODUODENOSCOPY N/A 02/02/2014   Procedure: ESOPHAGOGASTRODUODENOSCOPY (EGD);  Surgeon: Malissa Hippo, MD;  [facility]: [facility];  Service: Endoscopy;  Laterality: N/A;  120   EYE SURGERY     Catartact bilateral   JOINT REPLACEMENT  March 2013   Left knee   JOINT REPLACEMENT  2006   Left Knee   kidney stent     Left December 2013   LEFT HEART CATH AND CORONARY ANGIOGRAPHY N/A 11/13/2021   Procedure: LEFT HEART CATH AND CORONARY ANGIOGRAPHY;  Surgeon: Iran Ouch, MD;  [facility];  Service: Cardiovascular;  Laterality: N/A;   LEFT HEART CATH AND CORONARY ANGIOGRAPHY N/A 11/18/2022   Procedure: LEFT HEART CATH AND CORONARY ANGIOGRAPHY;  Surgeon: Marykay Lex, MD;  Location: [facility] INVASIVE CV LAB;  Service: Cardiovascular;  Laterality: N/A;   LUMBAR DISC SURGERY     L5-S1   MULTIPLE TOOTH EXTRACTIONS  Jan. 2015   Neck Fusion  1996   C4 to C6   PERCUTANEOUS STENT INTERVENTION Left 06/15/2012   Procedure: PERCUTANEOUS STENT INTERVENTION;  Surgeon: Nada Libman, MD;  Location: [facility] CATH LAB;  Service: Cardiovascular;  Laterality: Left;  renal   PERIPHERAL VASCULAR BALLOON ANGIOPLASTY  05/26/2017   Procedure: PERIPHERAL VASCULAR BALLOON ANGIOPLASTY;  Surgeon: Nada Libman, MD;  [facility];  Service: Cardiovascular;;  Lt. Renal Angioplasty   POLYPECTOMY  05/08/2017   Procedure: POLYPECTOMY;  Surgeon: Malissa Hippo, MD;  [facility];  Service: Endoscopy;;  colon    RENAL ANGIOGRAM N/A 06/15/2012   Procedure: RENAL ANGIOGRAM;  Surgeon: Nada Libman, MD;  Location: [facility] CATH LAB;  Service: Cardiovascular;  Laterality: N/A;   RENAL ANGIOGRAPHY  05/26/2017   Procedure: RENAL ANGIOGRAPHY;  Surgeon: Nada Libman, MD;  [facility];  Service: Cardiovascular;;   SPINE SURGERY  1994   Ruptured disc   TEMPORARY PACEMAKER N/A 11/19/2022   Procedure: TEMPORARY PACEMAKER;  Surgeon: Iran Ouch, MD;  [facility];  Service: Cardiovascular;  Laterality: N/A;   TONSILECTOMY, ADENOIDECTOMY, BILATERAL MYRINGOTOMY AND TUBES  1969   TOTAL KNEE ARTHROPLASTY     Left   TUBAL LIGATION      Allergies  Allergies  Allergen Reactions   Celecoxib Itching and Other (See Comments)    Other Reaction(s): Not available   Citalopram Other (See Comments) and Palpitations    Heart rate increased   Morphine And Codeine Other (See Comments) and Itching    Patient states she is not allergic to morphine; states had morphine with no reaction   Ciprofloxacin Hcl Other (See Comments)    Mya have made her sick or bad diarrhea   Dicyclomine Diarrhea and Other (See Comments)    Made diarrhea worse   Lamotrigine Other (See Comments) and Diarrhea    Unknown   Metoclopramide Other (See Comments)    Dizziness   Milnacipran Hcl Other (See Comments)    Unknown   Omeprazole Other (See Comments)   Pregabalin Other (See Comments)   Serotonin Reuptake Inhibitors (Ssris) Other (See Comments)   Simvastatin Other (See Comments)    Increased back and muscle pain   Tizanidine Other (See Comments)    unknown   Trazodone And Nefazodone Other (See Comments)    hallucinations   Venlafaxine Other (See Comments)    Affected eyes, blurred vision   Codeine Itching   Morphine Other (See Comments)   Nefazodone Other (See Comments)   Tape Other (See Comments)    Skin gets red and skin pulls off, paper tape only    Sertraline Hcl Other (See Comments)    Stomach upset. Pt takes this med at home 11/2022    History of Present Illness    [name] is a 72 y.o. female with a PMH as mentioned above.  Cardiac catheterization in May 2023 revealed left main/multivessel CAD with poor revascularize options-see result below.  Evaluated by Dr. Laneta Simmers and was not felt to be a surgical candidate.  It was felt that PCA would be very high risk, therefore medical therapy recommended.  Closely followed by [facility].  Last seen by Dr. Diona Browner on September 04, 2022.  Was overall doing well at that time.  Denied any chest pain.  Recent hospital admission for chest pain.  Underwent left heart cath that showed 90% ostial to left main stenosis, received PCI to  left main.  Recommended to continue DAPT x 1 year.  Today she presents for PCI follow-up.  She states she is doing well.  Does admit to episodes of panic attacks, says she feels like her heart is fluttering, she has taken nitroglycerin and Xanax that has helped.  Denies any chest pain.  Admits to bruising from heart catheterization and leg edema.  States prior to hospitalization legs were not swollen.  Also admits to having floaters that were not there before.  Denies any red flag symptoms. Denies any chest pain, shortness of breath, syncope, presyncope, dizziness, orthopnea, PND, significant weight changes, acute bleeding, or claudication.  EKGs/Labs/Other Studies Reviewed:   The following studies were reviewed today:   EKG:  EKG is not ordered today.   Coronary stent intervention 5/80/2024:   Mid Cx lesion is 20% stenosed.   Mid Cx to Dist Cx lesion is 70% stenosed.   Prox Cx lesion is 60% stenosed.   Ost LM to Mid LM lesion is 90% stenosed.   A drug-eluting stent was successfully placed using a STENT MEGATRON 3.5X8.   Post intervention, there is a 10% residual stenosis.   Successful IVUS guided high risk rotational atherectomy and drug-eluting stent placement to  the ostial left main coronary artery extending into the distal segment just before the bifurcation.  Difficult procedure overall due to heavy eccentric calcifications and short left main. The patient had chest pain and elevated blood pressure with balloon inflations and thus she was started on nitroglycerin drip with resolution of symptoms.   Recommendations: Dual antiplatelet therapy for at least 1 year. Aggressive treatment of risk factors. Remove venous sheath once ACT is below 200  Left heart cath 11/18/2022:   Ost LM to Mid LM lesion is 90% stenosed.   Mid Cx lesion is 20% stenosed.   Mid Cx to Dist Cx lesion is 70% stenosed.  Prox Cx lesion is 60% stenosed.   Ost RCA to Prox RCA lesion is 20% stenosed.   Prox RCA to Mid RCA lesion is 30% stenosed.   Prox RCA lesion is 60% stenosed.   RPDA lesion is 100% stenosed. fills via L-R collaterals   RPAV lesion is 60% stenosed.   LV end diastolic pressure is moderately elevated.   There is no aortic valve stenosis.   POST-CATH DIAGNOSES Relatively stable elliptical calcified 90% Left Main stenosis with significant catheter dampening during engagement pressures normalized the pressures in the 80s with significant anginal chest pain Progression of proximal RCA disease but otherwise stable disease elsewhere. Significant pressure gradient in the innominate this up to right subclavian artery with tandem lesions leading to a 40 to 50 mmHg gradient.   RECOMMENDATIONS Will discuss with ICU team determine best course of action, would likely plan unsupported left main PCI with either atherectomy versus shockwave lithotripsy and IVUS guidance. Would likely load with Plavix once the plan for PCI is made.  Echocardiogram 11/17/2022: 1. Left ventricular ejection fraction, by estimation, is 55 to 60%. The  left ventricle has normal function. The left ventricle has no regional  wall motion abnormalities. There is mild concentric left ventricular   hypertrophy. Left ventricular diastolic  parameters are consistent with Grade I diastolic dysfunction (impaired  relaxation).   2. Right ventricular systolic function is normal. The right ventricular  size is normal. There is normal pulmonary artery systolic pressure. The  estimated right ventricular systolic pressure is 19.2 mmHg.   3. The mitral valve is normal in structure. Trivial mitral valve  regurgitation. No evidence of mitral stenosis.   4. The aortic valve is tricuspid. There is mild calcification of the  aortic valve. Aortic valve regurgitation is not visualized. No aortic  stenosis is present.   5. The inferior vena cava is normal in size with greater than 50%  respiratory variability, suggesting right atrial pressure of 3 mmHg.  Carotid duplex 10/06/2022: Summary:  Right Carotid: Velocities in the right ICA are consistent with a 80-99% stenosis. Hemodynamically significant plaque >50% visualized in the proximal and distal CCA. The ECA appears >50% stenosed.   Left Carotid: Velocities in the left ICA are consistent with a 1-39% stenosis. Hemodynamically significant plaque >50% visualized in the mid and distal CCA.   Vertebrals:  Bilateral vertebral arteries demonstrate antegrade flow. High velocities in the left vertebral.   Subclavians: Bilateral subclavian arteries were stenotic. Bilateral  subclavian artery flow was disturbed.   Recent Labs: 11/17/2022: ALT 13 11/20/2022: BUN 18; Creatinine, Ser 1.26; Hemoglobin 11.6; Platelets 129; Potassium 3.8; Sodium 137  Recent Lipid Panel    Component Value Date/Time   CHOL 124 11/18/2022 0245   TRIG 149 11/18/2022 0245   HDL 50 11/18/2022 0245   CHOLHDL 2.5 11/18/2022 0245   VLDL 30 11/18/2022 0245   LDLCALC 44 11/18/2022 0245    Home Medications   Current Meds  Medication Sig   albuterol (VENTOLIN HFA) 108 (90 Base) MCG/ACT inhaler 1 puff 4 (four) times daily as needed for wheezing or shortness of breath.   ALPRAZolam  (XANAX) 0.5 MG tablet Take 0.5 mg by mouth 3 (three) times daily as needed.   amLODipine (NORVASC) 10 MG tablet Take 1 tablet (10 mg total) by mouth daily.   aspirin 81 MG EC tablet Take 81 mg by mouth daily.   clopidogrel (PLAVIX) 75 MG tablet Take 1 tablet (75 mg total) by mouth daily.   diclofenac Sodium (VOLTAREN) 1 % GEL Apply 2 g topically 3 (three) times daily.   DULoxetine (CYMBALTA) 20 MG capsule Take 20 mg by mouth at bedtime.   EPINEPHrine (EPIPEN JR) 0.15 MG/0.3ML injection Inject 0.15 mg into the muscle as needed for anaphylaxis.   isosorbide mononitrate (IMDUR) 60 MG 24 hr tablet Take 1 tablet (60 mg total) by mouth daily.   levothyroxine (SYNTHROID) 25 MCG tablet Take 25 mcg by mouth daily before breakfast.   metoprolol succinate (TOPROL-XL) 100 MG 24 hr tablet Take 1 tablet (100 mg total) by mouth daily. Take with or immediately following a meal.   Multiple Vitamins-Minerals (ALIVE WOMENS GUMMY PO) Take 1 tablet by mouth daily. gummy   nitroGLYCERIN (NITROSTAT) 0.4 MG SL tablet Place 1 tablet (0.4 mg total) under the tongue every 5 (five) minutes as needed for chest pain.   ondansetron (ZOFRAN) 4 MG tablet Take 4 mg by mouth 3 (three) times daily as needed for nausea.   oxyCODONE-acetaminophen (PERCOCET/ROXICET) 5-325 MG tablet Take 1 tablet by mouth 3 (three) times daily as needed for moderate pain or severe pain.   pantoprazole (PROTONIX) 40 MG tablet Take 40 mg by mouth daily.   rosuvastatin (CRESTOR) 40 MG tablet Take 1 tablet (40 mg total) by mouth at bedtime. Please call [phone] to schedule an appointment for future refills. Thank you.   sertraline (ZOLOFT) 50 MG tablet Take 50 mg by mouth daily.   vitamin B-12 (CYANOCOBALAMIN) 1000 MCG tablet Take 2 tablets (2,000 mcg total) by mouth daily. (Patient taking differently: Take 1,000 mcg by mouth daily.)   Vitamin D, Ergocalciferol, (DRISDOL) 50000  UNITS CAPS Take 50,000 Units by mouth every Monday.    zinc gluconate 50 MG  tablet Take 50 mg by mouth daily.     Review of Systems    All other systems reviewed and are otherwise negative except as noted above.  Physical Exam    VS:  BP (!) 120/56   Pulse 71   Ht 4\' 11"  (1.499 m)   Wt 162 lb 9.6 oz (73.8 kg)   SpO2 91%   BMI 32.84 kg/m  , BMI Body mass index is 32.84 kg/m.  Wt Readings from Last 3 Encounters:  12/01/22 162 lb 9.6 oz (73.8 kg)  11/19/22 158 lb 9.6 oz (71.9 kg)  10/06/22 162 lb (73.5 kg)     GEN: Obese, 72 y.o. female in no acute distress. HEENT: normal. Neck: Supple, no JVD, carotid bruits, or masses. Cardiac: S1/S2, RRR, no murmurs, rubs, or gallops. No clubbing, cyanosis.  1+ pitting edema to BLE. Radials/PT 2+ and equal bilaterally.  Respiratory:  Respirations regular and unlabored, clear to auscultation bilaterally. GI: Soft, nontender, nondistended. Skin: Warm and dry, no rash, scattered generalized bruising. Neuro:  Strength and sensation are intact. Psych: Normal affect.  Assessment & Plan    CAD, medication management Stable with no anginal symptoms.  See recent LHC as mentioned above.  Does admit to what sounds like some anxiety episodes-see #5.  Does note some bruising that is resolving, no hematoma noted.  Continue aspirin, Plavix, Imdur, metoprolol succinate, rosuvastatin, and nitroglycerin as needed.  Okay to start cardiac rehab. Heart healthy diet and regular cardiovascular exercise encouraged.  Will obtain CBC and BMET in 1 week.     Cardiac Rehabilitation Eligibility Assessment  The patient is ready to start cardiac rehabilitation from a cardiac standpoint.   PAD, carotid artery disease, hyperlipidemia Closely followed by Dr. Myra Gianotti. Carotid Doppler 09/2022 revealed severe stenosis along right ICA, mild stenosis (1 to 39%) along left ICA.  Denies any symptoms.  Recent LDL 44.  Continue current medication regiment. Heart healthy diet and regular cardiovascular exercise encouraged.   3.  Leg edema +1 pitting  edema to bilateral lower extremities.  Recommended wearing compression stockings.  Will obtain BMET in 1 week.  If kidney function permits, will prescribe Lasix 20 mg daily as needed for leg edema.  Low-salt, heart healthy diet and regular cardiovascular exercise encouraged.   4. HTN Blood pressure stable. Discussed to monitor BP at home at least 2 hours after medications and sitting for 5-10 minutes.  Continue amlodipine, Imdur, and metoprolol succinate. Heart healthy diet and regular cardiovascular exercise encouraged.   5. CKD stage 3 Most recent kidney function stable.  Will obtain BMET in 1 week as mentioned above.  Avoid nephrotoxic agents.  Continue follow-up with PCP.  6. Anxiety She is requesting a refill of her Xanax. States her PCP will not refill this.  I recommended a psychiatry referral, she is agreeable.  Will arrange.  7.  Vitreous floaters States she has noticed this since hospital discharge.  Denies any red flag symptoms.  I offered to arrange a ophthalmology referral, and she politely declined.  Recommended to continue to follow-up with eye doctor. ED precautions discussed.  Disposition: Follow up with Nona Dell, MD or [facility] as scheduled.  Signed, Sharlene Dory, NP 12/01/2022, 9:19 AM [facility]

## 2022-12-02 ENCOUNTER — Other Ambulatory Visit: Payer: Self-pay | Admitting: Cardiology

## 2022-12-13 ENCOUNTER — Emergency Department (HOSPITAL_COMMUNITY): Payer: 59

## 2022-12-13 ENCOUNTER — Encounter (HOSPITAL_COMMUNITY): Payer: Self-pay | Admitting: Emergency Medicine

## 2022-12-13 ENCOUNTER — Other Ambulatory Visit: Payer: Self-pay

## 2022-12-13 ENCOUNTER — Emergency Department (HOSPITAL_COMMUNITY)
Admission: EM | Admit: 2022-12-13 | Discharge: 2022-12-13 | Disposition: A | Discharge status: Home / Self Care | Payer: 59 | Attending: Emergency Medicine | Admitting: Emergency Medicine

## 2022-12-13 DIAGNOSIS — Z7902 Long term (current) use of antithrombotics/antiplatelets: Secondary | ICD-10-CM | POA: Diagnosis not present

## 2022-12-13 DIAGNOSIS — Z7982 Long term (current) use of aspirin: Secondary | ICD-10-CM | POA: Diagnosis not present

## 2022-12-13 DIAGNOSIS — R1030 Lower abdominal pain, unspecified: Secondary | ICD-10-CM | POA: Diagnosis not present

## 2022-12-13 DIAGNOSIS — Z79899 Other long term (current) drug therapy: Secondary | ICD-10-CM | POA: Diagnosis not present

## 2022-12-13 DIAGNOSIS — R319 Hematuria, unspecified: Secondary | ICD-10-CM | POA: Insufficient documentation

## 2022-12-13 DIAGNOSIS — Z87891 Personal history of nicotine dependence: Secondary | ICD-10-CM | POA: Diagnosis not present

## 2022-12-13 DIAGNOSIS — R079 Chest pain, unspecified: Secondary | ICD-10-CM | POA: Diagnosis not present

## 2022-12-13 DIAGNOSIS — I10 Essential (primary) hypertension: Secondary | ICD-10-CM | POA: Insufficient documentation

## 2022-12-13 LAB — BASIC METABOLIC PANEL
Anion gap: 10 (ref 5–15)
BUN: 14 mg/dL (ref 8–23)
CO2: 25 mmol/L (ref 22–32)
Calcium: 9 mg/dL (ref 8.9–10.3)
Chloride: 98 mmol/L (ref 98–111)
Creatinine, Ser: 0.88 mg/dL (ref 0.44–1.00)
GFR, Estimated: >60 mL/min (ref >60)
Glucose, Bld: 119 mg/dL — ABNORMAL HIGH (ref 70–99)
Potassium: 4.2 mmol/L (ref 3.5–5.1)
Sodium: 133 mmol/L — ABNORMAL LOW (ref 135–145)

## 2022-12-13 LAB — HEPATIC FUNCTION PANEL
ALT: 13 U/L (ref 0–44)
AST: 18 U/L (ref 15–41)
Albumin: 4.3 g/dL (ref 3.5–5.0)
Alkaline Phosphatase: 64 U/L (ref 38–126)
Bilirubin, Direct: 0.1 mg/dL (ref 0.0–0.2)
Indirect Bilirubin: 0.7 mg/dL (ref 0.3–0.9)
Total Bilirubin: 0.8 mg/dL (ref 0.3–1.2)
Total Protein: 7.8 g/dL (ref 6.5–8.1)

## 2022-12-13 LAB — URINALYSIS, ROUTINE W REFLEX MICROSCOPIC
Bacteria, UA: NONE SEEN
Bilirubin Urine: NEGATIVE
Glucose, UA: NEGATIVE mg/dL
Hgb urine dipstick: NEGATIVE
Ketones, ur: NEGATIVE mg/dL
Leukocytes,Ua: NEGATIVE
Nitrite: NEGATIVE
Protein, ur: 100 mg/dL — AB
Specific Gravity, Urine: 1.005 (ref 1.005–1.030)
pH: 7 (ref 5.0–8.0)

## 2022-12-13 LAB — CBC
HCT: 40.8 % (ref 36.0–46.0)
Hemoglobin: 13.4 g/dL (ref 12.0–15.0)
MCH: 31.9 pg (ref 26.0–34.0)
MCHC: 32.8 g/dL (ref 30.0–36.0)
MCV: 97.1 fL (ref 80.0–100.0)
Platelets: 267 10*3/uL (ref 150–400)
RBC: 4.2 MIL/uL (ref 3.87–5.11)
RDW: 13.9 % (ref 11.5–15.5)
WBC: 8.2 10*3/uL (ref 4.0–10.5)
nRBC: 0 % (ref 0.0–0.2)

## 2022-12-13 LAB — BRAIN NATRIURETIC PEPTIDE: B Natriuretic Peptide: 289 pg/mL — ABNORMAL HIGH (ref 0.0–100.0)

## 2022-12-13 MED ORDER — SODIUM CHLORIDE 0.9 % IV SOLN: INTRAVENOUS | Status: DC

## 2022-12-13 MED ORDER — ONDANSETRON HCL 4 MG/2ML IJ SOLN
4.0000 mg | Freq: Once | INTRAMUSCULAR | Status: AC
  Administered 2022-12-13: 4 mg via INTRAVENOUS
  Filled 2022-12-13: qty 2

## 2022-12-13 MED ORDER — SODIUM CHLORIDE 0.9 % IV BOLUS
500.0000 mL | Freq: Once | INTRAVENOUS | Status: AC
  Administered 2022-12-13: 500 mL via INTRAVENOUS

## 2022-12-13 MED ORDER — HYDROMORPHONE HCL 1 MG/ML IJ SOLN
0.5000 mg | Freq: Once | INTRAMUSCULAR | Status: AC
  Administered 2022-12-13: 0.5 mg via INTRAVENOUS
  Filled 2022-12-13: qty 0.5

## 2022-12-13 NOTE — ED Provider Notes (Signed)
[place] EMERGENCY DEPARTMENT AT [facility] Provider Note   CSN: [id number] Arrival date & time: 12/13/22  1643     History  Chief Complaint  Patient presents with   Hematuria   Flank Pain    [name] is a 72 y.o. female.  Patient brought in with family member.  Patient with 4 days history of bilateral lower quadrant abdominal pain and hematuria with red blood in the toilet water.  Not coming from vaginal area or coming from rectal area.  Also associated with some low back pain.  Patient followed by cardiology and recent admission May 5 for acute coronary syndrome high troponins patient had a stent.  Patient also has a left renal artery stent that was placed by vascular surgery in the past.  Patient's blood pressure here is 158/72 patient states she had lower abdominal pains had some nausea.  She is worried about her kidneys and worried about a kidney infection.  Patient is on Plavix she is not on another blood thinner.  Patient may be noncompliant with her meds.  Past medical history also sniffing for hypertension reflux disease hyperlipidemia fibromyalgia the renal artery stenosis tobacco abuse patient just quit smoking in August 2023.  Patient denies any chest pain or shortness of breath.       Home Medications Prior to Admission medications   Medication Sig Start Date End Date Taking? Authorizing Provider  albuterol (VENTOLIN HFA) 108 (90 Base) MCG/ACT inhaler 1 puff 4 (four) times daily as needed for wheezing or shortness of breath. 04/22/21  Yes [provider]  amLODipine (NORVASC) 10 MG tablet Take 1 tablet (10 mg total) by mouth daily. 11/21/22  Yes Osvaldo Shipper, MD  aspirin 81 MG EC tablet Take 81 mg by mouth daily.   Yes [provider]  clopidogrel (PLAVIX) 75 MG tablet Take 1 tablet (75 mg total) by mouth daily. 11/21/22  Yes Osvaldo Shipper, MD  diclofenac Sodium (VOLTAREN) 1 % GEL Apply 2 g topically 3 (three) times daily.   Yes  [provider]  DULoxetine (CYMBALTA) 20 MG capsule Take 20 mg by mouth at bedtime. 05/28/21  Yes [provider]  isosorbide mononitrate (IMDUR) 60 MG 24 hr tablet Take 1 tablet (60 mg total) by mouth daily. Patient taking differently: Take 30 mg by mouth daily. 11/21/22  Yes Osvaldo Shipper, MD  levothyroxine (SYNTHROID) 25 MCG tablet Take 25 mcg by mouth daily before breakfast. 02/16/20  Yes [provider]  metoprolol succinate (TOPROL-XL) 100 MG 24 hr tablet Take 1 tablet (100 mg total) by mouth daily. Take with or immediately following a meal. Patient taking differently: Take 50 mg by mouth daily. Take with or immediately following a meal. 11/21/22  Yes Osvaldo Shipper, MD  nitroGLYCERIN (NITROSTAT) 0.4 MG SL tablet Place 1 tablet (0.4 mg total) under the tongue every 5 (five) minutes as needed for chest pain. 04/07/22  Yes Nada Libman, MD  ondansetron (ZOFRAN) 4 MG tablet Take 4 mg by mouth 3 (three) times daily as needed for nausea. 06/03/21  Yes [provider]  oxyCODONE-acetaminophen (PERCOCET/ROXICET) 5-325 MG tablet Take 1 tablet by mouth 3 (three) times daily as needed for moderate pain or severe pain. 06/05/21  Yes [provider]  pantoprazole (PROTONIX) 40 MG tablet Take 40 mg by mouth daily. 10/08/22  Yes [provider]  rosuvastatin (CRESTOR) 40 MG tablet TAKE 1 TABLET BY MOUTH AT BEDTIME 12/02/22  Yes Jonelle Sidle, MD  sertraline (  ZOLOFT) 50 MG tablet Take 50 mg by mouth daily. 05/28/21  Yes [provider]  vitamin B-12 (CYANOCOBALAMIN) 1000 MCG tablet Take 2 tablets (2,000 mcg total) by mouth daily. Patient taking differently: Take 1,000 mcg by mouth daily. 03/13/20  Yes Glade Lloyd, MD  Vitamin D, Ergocalciferol, (DRISDOL) 50000 UNITS CAPS Take 50,000 Units by mouth every Monday.    Yes [provider]  ALPRAZolam Prudy Feeler) 0.5 MG tablet Take 0.5 mg by mouth 3 (three) times daily as needed. Patient  not taking: Reported on 12/13/2022 11/14/22   [provider]  EPINEPHrine (EPIPEN JR) 0.15 MG/0.3ML injection Inject 0.15 mg into the muscle as needed for anaphylaxis. Patient not taking: Reported on 12/13/2022    [provider]      Allergies    Celecoxib, Citalopram, Morphine and codeine, Ciprofloxacin hcl, Dicyclomine, Lamotrigine, Metoclopramide, Milnacipran hcl, Omeprazole, Pregabalin, Serotonin reuptake inhibitors (ssris), Simvastatin, Tizanidine, Trazodone and nefazodone, Venlafaxine, Codeine, Morphine, Nefazodone, Tape, and Sertraline hcl    Review of Systems   Review of Systems  Constitutional:  Negative for chills and fever.  HENT:  Negative for ear pain and sore throat.   Eyes:  Negative for pain and visual disturbance.  Respiratory:  Negative for cough and shortness of breath.   Cardiovascular:  Negative for chest pain and palpitations.  Gastrointestinal:  Positive for abdominal pain and nausea. Negative for blood in stool and vomiting.  Genitourinary:  Positive for hematuria. Negative for dysuria.  Musculoskeletal:  Negative for arthralgias and back pain.  Skin:  Negative for color change and rash.  Neurological:  Negative for seizures and syncope.  All other systems reviewed and are negative.   Physical Exam Updated Vital Signs BP (!) 174/52   Pulse 61   Temp 97.7 F (36.5 C) (Oral)   Resp 16   Ht 1.499 m (4\' 11" )   Wt 72.6 kg   SpO2 96%   BMI 32.32 kg/m  Physical Exam Vitals and nursing note reviewed.  Constitutional:      General: She is not in acute distress.    Appearance: Normal appearance. She is well-developed.  HENT:     Head: Normocephalic and atraumatic.     Mouth/Throat:     Mouth: Mucous membranes are moist.  Eyes:     Extraocular Movements: Extraocular movements intact.     Conjunctiva/sclera: Conjunctivae normal.     Pupils: Pupils are equal, round, and reactive to light.  Cardiovascular:     Rate and Rhythm: Normal rate and  regular rhythm.     Heart sounds: No murmur heard. Pulmonary:     Effort: Pulmonary effort is normal. No respiratory distress.     Breath sounds: Normal breath sounds.  Abdominal:     General: There is no distension.     Palpations: Abdomen is soft.     Tenderness: There is no abdominal tenderness. There is no guarding.  Genitourinary:    Rectum: Normal. Guaiac result negative.     Comments: No evidence of any blood in the vaginal area.  No any evidence of blood around the urethra.  Rectal is heme occult negative brown stool. Musculoskeletal:        General: No swelling.     Cervical back: Normal range of motion and neck supple.  Skin:    General: Skin is warm and dry.     Capillary Refill: Capillary refill takes less than 2 seconds.  Neurological:     General: No focal deficit present.  Mental Status: She is alert and oriented to person, place, and time.  Psychiatric:        Mood and Affect: Mood normal.     ED Results / Procedures / Treatments   Labs (all labs ordered are listed, but only abnormal results are displayed) Labs Reviewed  URINALYSIS, ROUTINE W REFLEX MICROSCOPIC - Abnormal; Notable for the following components:      Result Value   Color, Urine STRAW (*)    Protein, ur 100 (*)    All other components within normal limits  BASIC METABOLIC PANEL - Abnormal; Notable for the following components:   Sodium 133 (*)    Glucose, Bld 119 (*)    All other components within normal limits  CBC  HEPATIC FUNCTION PANEL  BRAIN NATRIURETIC PEPTIDE    EKG EKG Interpretation  Date/Time:  Saturday December 13 2022 18:32:27 EDT Ventricular Rate:  61 PR Interval:  164 QRS Duration: 79 QT Interval:  449 QTC Calculation: 456 R Axis:   53 Text Interpretation: Sinus rhythm Abnormal R-wave progression, early transition Borderline ST elevation, lateral leads Confirmed by Vanetta Mulders [phone]) on 12/13/2022 6:55:33 PM  Radiology DG Chest Port 1 View  Result Date:  12/13/2022 CLINICAL DATA:  Abd pain EXAM: PORTABLE CHEST - 1 VIEW COMPARISON:  11/16/2022 FINDINGS: Coarse bilateral interstitial infiltrates or edema slightly increased since previous exam. Heart size upper limits normal. Aortic Atherosclerosis (ICD10-170.0). No effusion. Cervical fixation hardware partially visualized. IMPRESSION: Slight increase in coarse interstitial infiltrates or edema. Electronically Signed   By: Corlis Leak M.D.   On: 12/13/2022 18:16    Procedures Procedures    Medications Ordered in ED Medications  0.9 %  sodium chloride infusion ( Intravenous New Bag/Given 12/13/22 1852)  sodium chloride 0.9 % bolus 500 mL (500 mLs Intravenous New Bag/Given 12/13/22 1848)  HYDROmorphone (DILAUDID) injection 0.5 mg (0.5 mg Intravenous Given 12/13/22 1843)  ondansetron (ZOFRAN) injection 4 mg (4 mg Intravenous Given 12/13/22 1843)    ED Course/ Medical Decision Making/ A&P                             Medical Decision Making Amount and/or Complexity of Data Reviewed Labs: ordered. Radiology: ordered.  Risk Prescription drug management.   We do not have CT scan available here.  Original plan was to get CT chest CT abdomen due to the bilateral lower quadrant pain expected to find hematuria but no evidence of blood in the urine urinalysis completely normal renal function normal.  Sodium just a little low at 133.  CBC no leukocytosis hemoglobin is good at 13.4 and platelets are very good at 267.  Patient also had no shortness of breath and no chest pain regular chest x-ray did show a little bit of interstitial fluid increased from before that would have been the reason for the CT chest just to get a better look at that.  The main reason for transfer would have been for CT abdomen.  On close exam no hematuria no vaginal bleeding no blood in the rectal area.  Abdominal pain is now resolved.  Family and patient is comfortable going home with close follow-up with primary care doctor.  BNP was  ordered but is still pending patient's oxygen saturations have been steady in the low 90 percentile and patient in no respiratory distress and again denied any chest pain. Final Clinical Impression(s) / ED Diagnoses Final diagnoses:  Hematuria, unspecified type  Lower  abdominal pain    Rx / DC Orders ED Discharge Orders     None         Vanetta Mulders, MD 12/13/22 2012

## 2022-12-13 NOTE — ED Triage Notes (Signed)
Pt via POV c/o left flank pain and blood in urine x 1 week. Pt reports fever at home but has not checked her temp and feels SOB. Hx left renal stent.

## 2022-12-13 NOTE — Discharge Instructions (Signed)
An appointment to follow-up with your primary care doctor.  If you continue to see evidence of hematuria will need CT scan of the abdomen and pelvis.  Return for any new or worse symptoms.  Urinalysis normal here kidney function normal here complete blood count normal as well.  Chest x-ray did show a little bit of increased fluid interstitially in the lungs compared to your previous scan if you develop shortness of breath get seen by her primary or by cardiology.

## 2022-12-18 DIAGNOSIS — J449 Chronic obstructive pulmonary disease, unspecified: Secondary | ICD-10-CM | POA: Diagnosis not present

## 2022-12-18 DIAGNOSIS — I25119 Atherosclerotic heart disease of native coronary artery with unspecified angina pectoris: Secondary | ICD-10-CM | POA: Diagnosis not present

## 2022-12-18 DIAGNOSIS — I10 Essential (primary) hypertension: Secondary | ICD-10-CM | POA: Diagnosis not present

## 2022-12-18 DIAGNOSIS — Z299 Encounter for prophylactic measures, unspecified: Secondary | ICD-10-CM | POA: Diagnosis not present

## 2022-12-25 ENCOUNTER — Other Ambulatory Visit: Payer: Self-pay

## 2022-12-25 MED ORDER — CLOPIDOGREL BISULFATE 75 MG PO TABS: 75.0000 mg | ORAL_TABLET | Freq: Every day | ORAL | 2 refills | Status: DC

## 2022-12-25 NOTE — Telephone Encounter (Signed)
Refilled plavix 75 mg qd #90, RF2 to [facility]

## 2022-12-29 ENCOUNTER — Ambulatory Visit: Payer: 59 | Admitting: Internal Medicine

## 2022-12-29 ENCOUNTER — Other Ambulatory Visit (HOSPITAL_COMMUNITY): Payer: Self-pay

## 2022-12-29 NOTE — Progress Notes (Deleted)
[name], female    DOB: 1951/03/01    MRN: [id number]   Brief patient profile:  14  yowf quit smoking 02/18/22  with stable AP  referred to pulmonary clinic in [facility]  02/25/2022 by Velva Harman  for ? PF  with onset of  rhinitis/ sob several years prior to breathing problems she attributed  to covid. Aug 2022.   Has seen [facility] in past with dx of RA does not recall details of rx    History of Present Illness  02/25/2022  Pulmonary/ 1st office eval/ [name] / [facility] last dose of macrodantin 08/01/21  Chief Complaint  Patient presents with   Consult    Consult for pulm fibrosis ref by cardiology   *patient states she had chest pain this am when walking*   Dyspnea:  vaccuum /  walking mailbox slt uphill to mailbox 100 ft and freq nitro p walk less lately  Cough: no more cough  Sleep: no resp symptoms 30 degrees  SABA use: 7 hours prior to OV  Rec Avoid macrodantin indefinitely  GERD diet reviewed, bed blocks  Make sure you check your oxygen saturation  AT  your highest level of activity (not after you stop)   to be sure it stays over 90%  Please schedule a follow up visit in 3 months but call sooner if needed with pfts next available    07/10/2022  f/u ov/[place] office/Rashae Rother re: ? RA lung dz    Chief Complaint  Patient presents with   Follow-up    Breathing doing okay.  Was unsure of walking test today- having issues with her spine   Dyspnea:  limited by back pain more than breathing / no longer under care of rheum  Cough: mild daytime assoc with mucoid pnds  Sleeping: 30 degrees  s resp cc  SABA use: every few days, helps bring up sputum = mucoid  02: none  Covid status: last covid vax Aug 2021 Rec Make sure you check your oxygen saturation at your highest level of activity (NOT after you stop)  to be sure it stays over 90% PFTs> not done by 12/29/2022     12/29/2022  f/u ov/[place] office/Bera Pinela re: *** maint on ***  No chief complaint on file.    Dyspnea:  *** Cough: *** Sleeping: *** SABA use: *** 02: *** Covid status: *** Lung cancer screening: ***   No obvious day to day or daytime variability or assoc excess/ purulent sputum or mucus plugs or hemoptysis or cp or chest tightness, subjective wheeze or overt sinus or hb symptoms.   *** without nocturnal  or early am exacerbation  of respiratory  c/o's or need for noct saba. Also denies any obvious fluctuation of symptoms with weather or environmental changes or other aggravating or alleviating factors except as outlined above   No unusual exposure hx or h/o childhood pna/ asthma or knowledge of premature birth.  Current Allergies, Complete Past Medical History, Past Surgical History, Family History, and Social History were reviewed in [employer] record.  ROS  The following are not active complaints unless bolded Hoarseness, sore throat, dysphagia, dental problems, itching, sneezing,  nasal congestion or discharge of excess mucus or purulent secretions, ear ache,   fever, chills, sweats, unintended wt loss or wt gain, classically pleuritic or exertional cp,  orthopnea pnd or arm/hand swelling  or leg swelling, presyncope, palpitations, abdominal pain, anorexia, nausea, vomiting, diarrhea  or change in bowel habits or  change in bladder habits, change in stools or change in urine, dysuria, hematuria,  rash, arthralgias, visual complaints, headache, numbness, weakness or ataxia or problems with walking or coordination,  change in mood or  memory.        No outpatient medications have been marked as taking for the 12/29/22 encounter (Appointment) with Nyoka Cowden, MD.               Past Medical History:  Diagnosis Date   Abnormal chest CT    Anxiety    Arthritis    Rhumatoid and Osteoarthritis   Carotid artery disease (HCC)    Dr. Myra Gianotti, s/p left CEA, severe R ICA stenosis   Coronary atherosclerosis of native coronary artery    DES RCA/circumflex  4/10, LVEF 65 -> repeat cath 11/2021 with multivessel CAD including LM stenosis, initial med rx   Depression    Essential hypertension    Fibromyalgia    GERD (gastroesophageal reflux disease)    Hiatal hernia    Hyperlipidemia    Kidney cysts    NSTEMI (non-ST elevated myocardial infarction) (HCC)    4/10   Renal artery stenosis (HCC)    Left renal artery stent 12/13   Restless leg syndrome    Spondylosis without myelopathy    Tobacco abuse        Objective:    Wts   12/29/2022       *** 07/10/22 161 lb 9.6 oz (73.3 kg)  04/07/22 161 lb (73 kg)  02/25/22 162 lb 3.2 oz (73.6 kg)     Vital signs reviewed  12/29/2022  - Note at rest 02 sats  ***% on ***   General appearance:    ***      Min barrel  2-3/6 SEM s increase in P2***             Assessment

## 2023-01-19 DIAGNOSIS — Z299 Encounter for prophylactic measures, unspecified: Secondary | ICD-10-CM | POA: Diagnosis not present

## 2023-01-19 DIAGNOSIS — I10 Essential (primary) hypertension: Secondary | ICD-10-CM | POA: Diagnosis not present

## 2023-01-19 DIAGNOSIS — J449 Chronic obstructive pulmonary disease, unspecified: Secondary | ICD-10-CM | POA: Diagnosis not present

## 2023-01-19 DIAGNOSIS — I779 Disorder of arteries and arterioles, unspecified: Secondary | ICD-10-CM | POA: Diagnosis not present

## 2023-01-19 DIAGNOSIS — N1831 Chronic kidney disease, stage 3a: Secondary | ICD-10-CM | POA: Diagnosis not present

## 2023-01-19 DIAGNOSIS — I700 Atherosclerosis of aorta: Secondary | ICD-10-CM | POA: Diagnosis not present

## 2023-02-26 ENCOUNTER — Ambulatory Visit (HOSPITAL_COMMUNITY): Payer: 59 | Admitting: Psychiatry

## 2023-03-05 ENCOUNTER — Other Ambulatory Visit: Payer: Self-pay | Admitting: Cardiology

## 2023-03-09 DIAGNOSIS — G894 Chronic pain syndrome: Secondary | ICD-10-CM | POA: Diagnosis not present

## 2023-03-11 ENCOUNTER — Ambulatory Visit: Payer: 59 | Admitting: Cardiology

## 2023-03-11 NOTE — Progress Notes (Deleted)
Cardiology Office Note  Date: 03/11/2023   ID: [name], [name] [name], MRN [id number]  History of Present Illness: [name] is a 72 y.o. female last seen in May by Ms. Philis Nettle NP, reviewed the note.  She has complex cardiac history with severe left main/multivessel CAD. Patient was evaluated by Dr. Laneta Simmers in May 2023 and not felt to be a surgical candidate due to extensive aortic calcification extending into the great vessels, also off-pump LIMA to LAD not an option due to subclavian stenosis. The interventional team felt that PCI would be very high risk and therefore medical therapy was ultimately recommended.   Physical Exam: VS:  There were no vitals taken for this visit., BMI There is no height or weight on file to calculate BMI.  Wt Readings from Last 3 Encounters:  12/13/22 160 lb (72.6 kg)  12/01/22 162 lb 9.6 oz (73.8 kg)  11/19/22 158 lb 9.6 oz (71.9 kg)    General: Patient appears comfortable at rest. HEENT: Conjunctiva and lids normal, oropharynx clear with moist mucosa. Neck: Supple, no elevated JVP or carotid bruits, no thyromegaly. Lungs: Clear to auscultation, nonlabored breathing at rest. Cardiac: Regular rate and rhythm, no S3 or significant systolic murmur, no pericardial rub. Abdomen: Soft, nontender, no hepatomegaly, bowel sounds present, no guarding or rebound. Extremities: No pitting edema, distal pulses 2+. Skin: Warm and dry. Musculoskeletal: No kyphosis. Neuropsychiatric: Alert and oriented x3, affect grossly appropriate.  ECG:  An ECG dated 12/13/2022 was personally reviewed today and demonstrated:  Sinus rhythm with lead motion artifact.  Labwork: 12/13/2022: ALT 13; AST 18; B Natriuretic Peptide 289.0; BUN 14; Creatinine, Ser 0.88; Hemoglobin 13.4; Platelets 267; Potassium 4.2; Sodium 133     Component Value Date/Time   CHOL 124 11/18/2022 0245   TRIG 149 11/18/2022 0245   HDL 50 11/18/2022 0245   CHOLHDL 2.5 11/18/2022 0245   VLDL 30  11/18/2022 0245   LDLCALC 44 11/18/2022 0245   Other Studies Reviewed Today:  Carotid Dopplers 09/19/2021: 80 to 99% RICA stenosis, 1 to 39% LICA stenosis.   Echocardiogram 09/19/2021:  1. Left ventricular ejection fraction, by estimation, is 60 to 65%. The  left ventricle has normal function. The left ventricle has no regional  wall motion abnormalities. Left ventricular diastolic parameters are  consistent with Grade I diastolic  dysfunction (impaired relaxation). Elevated left atrial pressure.   2. Right ventricular systolic function is normal. The right ventricular  size is normal.   3. The mitral valve is abnormal. Mild mitral valve regurgitation. No  evidence of mitral stenosis.   4. The aortic valve is tricuspid. There is mild calcification of the  aortic valve. There is mild thickening of the aortic valve. Aortic valve  regurgitation is not visualized. No aortic stenosis is present.   Assessment and Plan:  1.  Left main/multivessel CAD with poor revascularization options and plan for medical therapy at this point.  LVEF 60 to 65% by echocardiogram in March.  2.  Carotid artery disease, severe RICA stenosis as discussed above.  She has been asymptomatic with plan for conservative management per prior workup with [facility].  3.  Essential hypertension.  4.  Mixed hyperlipidemia.  Disposition:  Follow up {follow up:15908}  Signed, Jonelle Sidle, M.D., F.A.C.C. [facility]

## 2023-03-20 DIAGNOSIS — Z0000 Encounter for general adult medical examination without abnormal findings: Secondary | ICD-10-CM | POA: Diagnosis not present

## 2023-03-20 DIAGNOSIS — E7800 Pure hypercholesterolemia, unspecified: Secondary | ICD-10-CM | POA: Diagnosis not present

## 2023-03-20 DIAGNOSIS — R5383 Other fatigue: Secondary | ICD-10-CM | POA: Diagnosis not present

## 2023-03-20 DIAGNOSIS — Z79899 Other long term (current) drug therapy: Secondary | ICD-10-CM | POA: Diagnosis not present

## 2023-03-20 DIAGNOSIS — Z87891 Personal history of nicotine dependence: Secondary | ICD-10-CM | POA: Diagnosis not present

## 2023-03-20 DIAGNOSIS — I10 Essential (primary) hypertension: Secondary | ICD-10-CM | POA: Diagnosis not present

## 2023-03-20 DIAGNOSIS — Z299 Encounter for prophylactic measures, unspecified: Secondary | ICD-10-CM | POA: Diagnosis not present

## 2023-04-02 DIAGNOSIS — Z299 Encounter for prophylactic measures, unspecified: Secondary | ICD-10-CM | POA: Diagnosis not present

## 2023-04-02 DIAGNOSIS — J019 Acute sinusitis, unspecified: Secondary | ICD-10-CM | POA: Diagnosis not present

## 2023-04-02 DIAGNOSIS — N1832 Chronic kidney disease, stage 3b: Secondary | ICD-10-CM | POA: Diagnosis not present

## 2023-04-02 DIAGNOSIS — R109 Unspecified abdominal pain: Secondary | ICD-10-CM | POA: Diagnosis not present

## 2023-04-02 DIAGNOSIS — I10 Essential (primary) hypertension: Secondary | ICD-10-CM | POA: Diagnosis not present

## 2023-04-04 ENCOUNTER — Other Ambulatory Visit: Payer: Self-pay | Admitting: Cardiology

## 2023-04-06 ENCOUNTER — Telehealth: Payer: Self-pay | Admitting: Cardiology

## 2023-04-06 ENCOUNTER — Ambulatory Visit (HOSPITAL_COMMUNITY)
Admission: RE | Admit: 2023-04-06 | Discharge: 2023-04-06 | Disposition: A | Discharge status: Home / Self Care | Payer: 59 | Source: Ambulatory Visit | Attending: Surgery | Admitting: Surgery

## 2023-04-06 ENCOUNTER — Other Ambulatory Visit: Payer: Self-pay

## 2023-04-06 ENCOUNTER — Ambulatory Visit (INDEPENDENT_AMBULATORY_CARE_PROVIDER_SITE_OTHER): Payer: 59 | Admitting: Surgery

## 2023-04-06 ENCOUNTER — Encounter: Payer: Self-pay | Admitting: Surgery

## 2023-04-06 VITALS — BP 145/66 | HR 58 | Temp 98.0°F | Resp 18 | Ht 59.0 in | Wt 146.8 lb

## 2023-04-06 DIAGNOSIS — I6523 Occlusion and stenosis of bilateral carotid arteries: Secondary | ICD-10-CM

## 2023-04-06 NOTE — Telephone Encounter (Signed)
Pre-operative Risk Assessment    Patient Name: [name]  DOB: [birth year] MRN: [id number]      Request for Surgical Clearance    Procedure:   Right Carotid Endarterectomy  Date of Surgery:  Clearance TBD                                 Surgeon:  Dr. Kristie Cowman Group or Practice Name:  [facility]   Phone number:  [phone] Fax number:  [phone]   Type of Clearance Requested:   - Medical    Type of Anesthesia:  General    Additional requests/questions:   [facility] would like medical clearance for the patient asap  Signed, Tawni Millers   04/06/2023, 2:49 PM

## 2023-04-06 NOTE — Telephone Encounter (Signed)
Name: [name]  DOB: 11-29-1950  MRN: [id number]  Primary Cardiologist: Nona Dell, MD   Preoperative team, please contact this patient and set up a phone call appointment for further preoperative risk assessment. Please obtain consent and complete medication review. Thank you for your help.  I confirm that guidance regarding antiplatelet and oral anticoagulation therapy has been completed and, if necessary, noted below.  None requested.    Carlos Levering, NP 04/06/2023, 4:21 PM [facility]

## 2023-04-06 NOTE — Progress Notes (Signed)
[facility] [place]  Patient name: [name] MRN: [id number] DOB: 02-Feb-1951 Sex: female   REASON FOR VISIT:    Follow-up  HISOTRY OF PRESENT ILLNESS:   [name] is a 72 y.o. female who is status post left carotid endarterectomy in 2010 for symptomatic stenosis.  She had extensive plaque which extended into her proximal common carotid artery.  I sent her for CT scan for further evaluation of her right-sided carotid disease.  Because of the extensive plaque, I do not think she is a good TCAR candidate.  I felt her best option was going to be a right carotid endarterectomy with retrograde stenting of the ostium if indicated.  She was also going to require intervention of recurrent mid left common carotid artery stenosis via redo carotid endarterectomy.  Because of her persistent cardiac symptoms, she was sent for formal clearance.  She had a cardiac catheterization.  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  She was also turned down by cardiology for high risk PCI.  Medical management has been favored for her coronary disease.  Because she was asymptomatic from her carotid perspective, I elected to monitor her without intervention.  She is back today for follow-up she denies any neurologic symptoms.  She denies numbness or weakness in either extremity.  She denies slurred speech.  She denies emesis features.  She has undergone PCI now on 11/19/2022 for chest pain  She does feel that her dizziness is getting worse   I performed left renal artery stenting for hypertension on 06/15/2012.  She developed a recurrence and underwent angioplasty on 05/26/2017.  The patient is a former smoker.  She takes a statin for hypercholesterolemia.  She has a history of coronary artery disease, status post MI.  She has persistent angina.  PAST MEDICAL HISTORY:   Past Medical History:  Diagnosis Date   Abnormal  chest CT    Anxiety    Arthritis    Rhumatoid and Osteoarthritis   Carotid artery disease (HCC)    Dr. Myra Gianotti, s/p left CEA, severe R ICA stenosis   Coronary atherosclerosis of native coronary artery    DES RCA/circumflex 4/10, LVEF 65 -> repeat cath 11/2021 with multivessel CAD including LM stenosis, initial med rx   Depression    Essential hypertension    Fibromyalgia    GERD (gastroesophageal reflux disease)    Hiatal hernia    Hyperlipidemia    Kidney cysts    NSTEMI (non-ST elevated myocardial infarction) (HCC)    4/10   Renal artery stenosis (HCC)    Left renal artery stent 12/13   Restless leg syndrome    Spondylosis without myelopathy    Tobacco abuse      FAMILY HISTORY:   Family History  Problem Relation Age of Onset   Other Mother        Cerebral hemorrage   Heart disease Mother    Heart attack Mother    Hyperlipidemia Mother    Heart disease Father    Heart attack Father    Hyperlipidemia Father    Heart disease Brother        Heart Disease before age 47   Diabetes Brother    Hypertension Brother    Heart attack Brother    Cancer Sister    Arthritis Sister    Heart disease Sister    Heart disease Daughter 45       Heart Disease  before age 70   Heart attack Daughter    Hypertension Daughter    Cancer Sister    Arthritis Sister    Diabetes Brother        Varicose  Veins   Peripheral vascular disease Brother    Diabetes Brother     SOCIAL HISTORY:   Social History   Tobacco Use   Smoking status: Former    Current packs/day: 0.00    Types: Cigarettes    Start date: 02/18/1977    Quit date: 02/18/2022    Years since quitting: 1.1   Smokeless tobacco: Never  Substance Use Topics   Alcohol use: No    Alcohol/week: 0.0 standard drinks of alcohol     ALLERGIES:   Allergies  Allergen Reactions   Celecoxib Itching and Other (See Comments)    Other Reaction(s): Not available   Citalopram Other (See Comments) and Palpitations    Heart rate  increased   Morphine And Codeine Other (See Comments) and Itching    Patient states she is not allergic to morphine; states had morphine with no reaction   Ciprofloxacin Hcl Other (See Comments)    Mya have made her sick or bad diarrhea   Dicyclomine Diarrhea and Other (See Comments)    Made diarrhea worse   Lamotrigine Other (See Comments) and Diarrhea    Unknown   Metoclopramide Other (See Comments)    Dizziness   Milnacipran Hcl Other (See Comments)    Unknown   Omeprazole Other (See Comments)   Pregabalin Other (See Comments)   Serotonin Reuptake Inhibitors (Ssris) Other (See Comments)   Simvastatin Other (See Comments)    Increased back and muscle pain   Tizanidine Other (See Comments)    unknown   Trazodone And Nefazodone Other (See Comments)    hallucinations   Venlafaxine Other (See Comments)    Affected eyes, blurred vision   Codeine Itching   Morphine Other (See Comments)   Nefazodone Other (See Comments)   Tape Other (See Comments)    Skin gets red and skin pulls off, paper tape only   Sertraline Hcl Other (See Comments)    Stomach upset. Pt takes this med at home 11/2022     CURRENT MEDICATIONS:   Current Outpatient Medications  Medication Sig Dispense Refill   albuterol (VENTOLIN HFA) 108 (90 Base) MCG/ACT inhaler 1 puff 4 (four) times daily as needed for wheezing or shortness of breath.     ALPRAZolam (XANAX) 0.5 MG tablet Take 0.5 mg by mouth 3 (three) times daily as needed. (Patient not taking: Reported on 12/13/2022)     amLODipine (NORVASC) 10 MG tablet Take 1 tablet (10 mg total) by mouth daily. 30 tablet 2   aspirin 81 MG EC tablet Take 81 mg by mouth daily.     clopidogrel (PLAVIX) 75 MG tablet Take 1 tablet (75 mg total) by mouth daily. 90 tablet 2   diclofenac Sodium (VOLTAREN) 1 % GEL Apply 2 g topically 3 (three) times daily.     DULoxetine (CYMBALTA) 20 MG capsule Take 20 mg by mouth at bedtime.     EPINEPHrine (EPIPEN JR) 0.15 MG/0.3ML injection  Inject 0.15 mg into the muscle as needed for anaphylaxis. (Patient not taking: Reported on 12/13/2022)     isosorbide mononitrate (IMDUR) 60 MG 24 hr tablet Take 1 tablet (60 mg total) by mouth daily. (Patient taking differently: Take 30 mg by mouth daily.) 30 tablet 2   levothyroxine (SYNTHROID) 25 MCG tablet Take 25  mcg by mouth daily before breakfast.     metoprolol succinate (TOPROL-XL) 100 MG 24 hr tablet Take 1 tablet (100 mg total) by mouth daily. Take with or immediately following a meal. (Patient taking differently: Take 50 mg by mouth daily. Take with or immediately following a meal.) 30 tablet 2   nitroGLYCERIN (NITROSTAT) 0.4 MG SL tablet Place 1 tablet (0.4 mg total) under the tongue every 5 (five) minutes as needed for chest pain. 30 tablet 12   ondansetron (ZOFRAN) 4 MG tablet Take 4 mg by mouth 3 (three) times daily as needed for nausea.     oxyCODONE-acetaminophen (PERCOCET/ROXICET) 5-325 MG tablet Take 1 tablet by mouth 3 (three) times daily as needed for moderate pain or severe pain.     pantoprazole (PROTONIX) 40 MG tablet Take 40 mg by mouth daily.     rosuvastatin (CRESTOR) 40 MG tablet TAKE 1 TABLET BY MOUTH AT BEDTIME 30 tablet 0   sertraline (ZOLOFT) 50 MG tablet Take 50 mg by mouth daily.     vitamin B-12 (CYANOCOBALAMIN) 1000 MCG tablet Take 2 tablets (2,000 mcg total) by mouth daily. (Patient taking differently: Take 1,000 mcg by mouth daily.) 60 tablet 0   Vitamin D, Ergocalciferol, (DRISDOL) 50000 UNITS CAPS Take 50,000 Units by mouth every Monday.      No current facility-administered medications for this visit.    REVIEW OF SYSTEMS:   [X]  denotes positive finding, [ ]  denotes negative finding Cardiac  Comments:  Chest pain or chest pressure:    Shortness of breath upon exertion:    Short of breath when lying flat:    Irregular heart rhythm:        Vascular    Pain in calf, thigh, or hip brought on by ambulation:    Pain in feet at night that wakes you up from  your sleep:     Blood clot in your veins:    Leg swelling:         Pulmonary    Oxygen at home:    Productive cough:     Wheezing:         Neurologic    Sudden weakness in arms or legs:     Sudden numbness in arms or legs:     Sudden onset of difficulty speaking or slurred speech:    Temporary loss of vision in one eye:     Problems with dizziness:  x       Gastrointestinal    Blood in stool:     Vomited blood:         Genitourinary    Burning when urinating:     Blood in urine:        Psychiatric    Major depression:         Hematologic    Bleeding problems:    Problems with blood clotting too easily:        Skin    Rashes or ulcers:        Constitutional    Fever or chills:      PHYSICAL EXAM:   Vitals:   04/06/23 0935  BP: (!) 145/66  Pulse: (!) 58  Resp: 18  Temp: 98 F (36.7 C)  TempSrc: Temporal  SpO2: 92%  Weight: 146 lb 12.8 oz (66.6 kg)  Height: 4\' 11"  (1.499 m)    GENERAL: The patient is a well-nourished female, in no acute distress. The vital signs are documented above. CARDIAC: There is a regular rate  and rhythm.  PULMONARY: Non-labored respirations ABDOMEN: Soft and non-tender with normal pitched bowel sounds.  MUSCULOSKELETAL: There are no major deformities or cyanosis. NEUROLOGIC: No focal weakness or paresthesias are detected. SKIN: There are no ulcers or rashes noted. PSYCHIATRIC: The patient has a normal affect.  STUDIES:   Carotid duplex is essentially unchanged.  There is flow in both carotid arteries   MEDICAL ISSUES:    ?  Symptomatic carotid stenosis, right greater than left: The patient's biggest complaint is dizziness.  Her severe carotid occlusive disease could be contributing.  Previously, she was felt to be too high risk for carotid surgery because of her severe coronary disease.  At that time, there was no plans for coronary intervention because of the high risk nature.  She developed chest pain over the summer and  underwent PCI.  I am sending her back to her cardiologist to determine whether or not we can proceed with carotid endarterectomy.  I would begin on the right side.  She will likely need retrograde carotid artery stenting of the ostium of her carotid artery.  This will be done to hopefully improve her dizziness symptoms.  I discussed the risks and benefits of the procedure with the patient including the risk of stroke and nerve injury.  All questions were answered.  I will not stop her dual antiplatelet therapy (aspirin and Plavix)  Charlena Cross, MD, FACS [facility] [phone] Pager [phone]

## 2023-04-07 ENCOUNTER — Telehealth: Payer: Self-pay

## 2023-04-07 NOTE — Telephone Encounter (Signed)
  Patient Consent for Virtual Visit        [name] has provided verbal consent on 04/07/2023 for a virtual visit (video or telephone).   CONSENT FOR VIRTUAL VISIT FOR:  [name]  By participating in this virtual visit I agree to the following:  I hereby voluntarily request, consent and authorize [facility] and its employed or contracted physicians, physician assistants, nurse practitioners or other licensed health care professionals (the Practitioner), to provide me with telemedicine health care services (the "Services") as deemed necessary by the treating Practitioner. I acknowledge and consent to receive the Services by the Practitioner via telemedicine. I understand that the telemedicine visit will involve communicating with the Practitioner through live audiovisual communication technology and the disclosure of certain medical information by electronic transmission. I acknowledge that I have been given the opportunity to request an in-person assessment or other available alternative prior to the telemedicine visit and am voluntarily participating in the telemedicine visit.  I understand that I have the right to withhold or withdraw my consent to the use of telemedicine in the course of my care at any time, without affecting my right to future care or treatment, and that the Practitioner or I may terminate the telemedicine visit at any time. I understand that I have the right to inspect all information obtained and/or recorded in the course of the telemedicine visit and may receive copies of available information for a reasonable fee.  I understand that some of the potential risks of receiving the Services via telemedicine include:  Delay or interruption in medical evaluation due to technological equipment failure or disruption; Information transmitted may not be sufficient (e.g. poor resolution of images) to allow for appropriate medical decision making by the Practitioner;  and/or  In rare instances, security protocols could fail, causing a breach of personal health information.  Furthermore, I acknowledge that it is my responsibility to provide information about my medical history, conditions and care that is complete and accurate to the best of my ability. I acknowledge that Practitioner's advice, recommendations, and/or decision may be based on factors not within their control, such as incomplete or inaccurate data provided by me or distortions of diagnostic images or specimens that may result from electronic transmissions. I understand that the practice of medicine is not an exact science and that Practitioner makes no warranties or guarantees regarding treatment outcomes. I acknowledge that a copy of this consent can be made available to me via my patient portal [facility] MyChart), or I can request a printed copy by calling the office of [facility].    I understand that my insurance will be billed for this visit.   I have read or had this consent read to me. I understand the contents of this consent, which adequately explains the benefits and risks of the Services being provided via telemedicine.  I have been provided ample opportunity to ask questions regarding this consent and the Services and have had my questions answered to my satisfaction. I give my informed consent for the services to be provided through the use of telemedicine in my medical care

## 2023-04-07 NOTE — Addendum Note (Signed)
Addended by: Michaelle Copas on: 04/07/2023 09:10 AM   Modules accepted: Orders

## 2023-04-07 NOTE — Telephone Encounter (Signed)
Spoke with patient who is agreeable to do a tele visit on 10/8 at 2 pm. Med rec and consent have been done.

## 2023-04-19 NOTE — Progress Notes (Unsigned)
Virtual Visit via Telephone Note   Because of [name]'s co-morbid illnesses, she is at least at moderate risk for complications without adequate follow up.  This format is felt to be most appropriate for this patient at this time.  The patient did not have access to video technology/had technical difficulties with video requiring transitioning to audio format only (telephone).  All issues noted in this document were discussed and addressed.  No physical exam could be performed with this format.  Please refer to the patient's chart for her consent to telehealth for [facility].  Evaluation Performed:  Preoperative cardiovascular risk assessment _____________   Date:  04/21/2023   Patient ID:  [name], DOB 1950-12-10, MRN [id number] Patient Location:  Home Provider location:   Office  Primary Care Provider:  Ignatius Specking, MD Primary Cardiologist:  Nona Dell, MD  NO SHOW  Chief Complaint / Patient Profile   72 y.o. y/o female with a h/o  CAD PCI to left main 11/18/2022 with recommendations to be on dual antiplatelet therapy for minimum of 1 year, carotid artery disease with severe RICA stenosis, IPF, HTN, renal artery stenosis, GERD, fibromyalgia, rheumatoid arthritis, hypothyroidism, CKD stage IIIb, and HLD.    She is pending right carotid endarterectomy by Dr. Myra Gianotti on date to be determined and presents today for telephonic preoperative cardiovascular risk assessment.  History of Present Illness    [name] is a 72 y.o. female who presents via audio/video conferencing for a telehealth visit today.  Pt was last seen in cardiology clinic on 12/01/2022 by Sharlene Dory, NP.  At that time [name] was doing well she was cleared to start cardiac rehab she continued to have episodes of anxiety.  The patient is now pending procedure as outlined above. Since her last visit, she   Past Medical History    Past Medical History:  Diagnosis Date    Abnormal chest CT    Anxiety    Arthritis    Rhumatoid and Osteoarthritis   Carotid artery disease (HCC)    Dr. Myra Gianotti, s/p left CEA, severe R ICA stenosis   Coronary atherosclerosis of native coronary artery    DES RCA/circumflex 4/10, LVEF 65 -> repeat cath 11/2021 with multivessel CAD including LM stenosis, initial med rx   Depression    Essential hypertension    Fibromyalgia    GERD (gastroesophageal reflux disease)    Hiatal hernia    Hyperlipidemia    Kidney cysts    NSTEMI (non-ST elevated myocardial infarction) (HCC)    4/10   Renal artery stenosis (HCC)    Left renal artery stent 12/13   Restless leg syndrome    Spondylosis without myelopathy    Tobacco abuse    Past Surgical History:  Procedure Laterality Date   ABDOMINAL HYSTERECTOMY  1977   BREAST LUMPECTOMY     CAROTID ENDARTERECTOMY Left 02/28/2009   CHOLECYSTECTOMY  1991   COLONOSCOPY     COLONOSCOPY N/A 05/08/2017   Procedure: COLONOSCOPY;  Surgeon: Malissa Hippo, MD;  [facility];  Service: Endoscopy;  Laterality: N/A;  12:00   CORONARY ATHERECTOMY N/A 11/19/2022   Procedure: CORONARY ATHERECTOMY;  Surgeon: Iran Ouch, MD;  [facility];  Service: Cardiovascular;  Laterality: N/A;   CORONARY STENT INTERVENTION N/A 11/19/2022   Procedure: CORONARY STENT INTERVENTION;  Surgeon: Iran Ouch, MD;  [facility];  Service: Cardiovascular;  Laterality: N/A;  CORONARY ULTRASOUND/IVUS N/A 11/19/2022   Procedure: Coronary Ultrasound/IVUS;  Surgeon: Iran Ouch, MD;  [facility];  Service: Cardiovascular;  Laterality: N/A;   ESOPHAGOGASTRODUODENOSCOPY N/A 04/07/2013   Procedure: ESOPHAGOGASTRODUODENOSCOPY (EGD);  Surgeon: Malissa Hippo, MD;  [facility];  Service: Endoscopy;  Laterality: N/A;  250   ESOPHAGOGASTRODUODENOSCOPY N/A 02/02/2014   Procedure: ESOPHAGOGASTRODUODENOSCOPY (EGD);  Surgeon: Malissa Hippo, MD;  [facility];  Service: Endoscopy;  Laterality: N/A;  120   EYE SURGERY     Catartact bilateral   JOINT REPLACEMENT  March 2013   Left knee   JOINT REPLACEMENT  2006   Left Knee   kidney stent     Left December 2013   LEFT HEART CATH AND CORONARY ANGIOGRAPHY N/A 11/13/2021   Procedure: LEFT HEART CATH AND CORONARY ANGIOGRAPHY;  Surgeon: Iran Ouch, MD;  [facility];  Service: Cardiovascular;  Laterality: N/A;   LEFT HEART CATH AND CORONARY ANGIOGRAPHY N/A 11/18/2022   Procedure: LEFT HEART CATH AND CORONARY ANGIOGRAPHY;  Surgeon: Marykay Lex, MD;  Location: [facility] INVASIVE CV LAB;  Service: Cardiovascular;  Laterality: N/A;   LUMBAR DISC SURGERY     L5-S1   MULTIPLE TOOTH EXTRACTIONS  Jan. 2015   Neck Fusion  1996   C4 to C6   PERCUTANEOUS STENT INTERVENTION Left 06/15/2012   Procedure: PERCUTANEOUS STENT INTERVENTION;  Surgeon: Nada Libman, MD;  Location: [facility] CATH LAB;  Service: Cardiovascular;  Laterality: Left;  renal   PERIPHERAL VASCULAR BALLOON ANGIOPLASTY  05/26/2017   Procedure: PERIPHERAL VASCULAR BALLOON ANGIOPLASTY;  Surgeon: Nada Libman, MD;  [facility];  Service: Cardiovascular;;  Lt. Renal Angioplasty   POLYPECTOMY  05/08/2017   Procedure: POLYPECTOMY;  Surgeon: Malissa Hippo, MD;  [facility];  Service: Endoscopy;;  colon    RENAL ANGIOGRAM N/A 06/15/2012   Procedure: RENAL ANGIOGRAM;  Surgeon: Nada Libman, MD;  Location: [facility] CATH LAB;  Service: Cardiovascular;  Laterality: N/A;   RENAL ANGIOGRAPHY  05/26/2017   Procedure: RENAL ANGIOGRAPHY;  Surgeon: Nada Libman, MD;  [facility];  Service: Cardiovascular;;   SPINE SURGERY  1994   Ruptured disc   TEMPORARY PACEMAKER N/A 11/19/2022   Procedure: TEMPORARY PACEMAKER;  Surgeon: Iran Ouch, MD;  [facility];  Service: Cardiovascular;  Laterality: N/A;   TONSILECTOMY, ADENOIDECTOMY, BILATERAL MYRINGOTOMY AND TUBES  1969    TOTAL KNEE ARTHROPLASTY     Left   TUBAL LIGATION      Allergies  Allergies  Allergen Reactions   Celecoxib Itching and Other (See Comments)    Other Reaction(s): Not available   Citalopram Other (See Comments) and Palpitations    Heart rate increased   Morphine And Codeine Other (See Comments) and Itching    Patient states she is not allergic to morphine; states had morphine with no reaction   Ciprofloxacin Hcl Other (See Comments)    Mya have made her sick or bad diarrhea   Dicyclomine Diarrhea and Other (See Comments)    Made diarrhea worse   Lamotrigine Other (See Comments) and Diarrhea    Unknown   Metoclopramide Other (See Comments)    Dizziness   Milnacipran Hcl Other (See Comments)    Unknown   Omeprazole Other (See Comments)   Pregabalin Other (See Comments)   Serotonin Reuptake Inhibitors (Ssris) Other (See Comments)   Simvastatin Other (See Comments)  Increased back and muscle pain   Tizanidine Other (See Comments)    unknown   Trazodone And Nefazodone Other (See Comments)    hallucinations   Venlafaxine Other (See Comments)    Affected eyes, blurred vision   Codeine Itching   Morphine Other (See Comments)   Nefazodone Other (See Comments)   Tape Other (See Comments)    Skin gets red and skin pulls off, paper tape only   Sertraline Hcl Other (See Comments)    Stomach upset. Pt takes this med at home 11/2022    Home Medications    Prior to Admission medications   Medication Sig Start Date End Date Taking? Authorizing Provider  albuterol (VENTOLIN HFA) 108 (90 Base) MCG/ACT inhaler 1 puff 4 (four) times daily as needed for wheezing or shortness of breath. 04/22/21   [provider]  ALPRAZolam Prudy Feeler) 0.5 MG tablet Take 0.5 mg by mouth 3 (three) times daily as needed. 11/14/22   [provider]  amLODipine (NORVASC) 10 MG tablet Take 1 tablet (10 mg total) by mouth daily. 11/21/22   Osvaldo Shipper, MD  amoxicillin-clavulanate  (AUGMENTIN) 500-125 MG tablet Take 1 tablet by mouth 2 (two) times daily. 04/02/23   [provider]  aspirin 81 MG EC tablet Take 81 mg by mouth daily.    [provider]  clopidogrel (PLAVIX) 75 MG tablet Take 1 tablet (75 mg total) by mouth daily. 12/25/22   Jonelle Sidle, MD  diclofenac Sodium (VOLTAREN) 1 % GEL Apply 2 g topically 3 (three) times daily.    [provider]  DULoxetine (CYMBALTA) 20 MG capsule Take 20 mg by mouth at bedtime. 05/28/21   [provider]  EPINEPHrine (EPIPEN JR) 0.15 MG/0.3ML injection Inject 0.15 mg into the muscle as needed for anaphylaxis.    [provider]  isosorbide mononitrate (IMDUR) 60 MG 24 hr tablet Take 1 tablet (60 mg total) by mouth daily. Patient taking differently: Take 30 mg by mouth daily. 11/21/22   Osvaldo Shipper, MD  levothyroxine (SYNTHROID) 25 MCG tablet Take 25 mcg by mouth daily before breakfast. 02/16/20   [provider]  metoprolol succinate (TOPROL-XL) 100 MG 24 hr tablet Take 1 tablet (100 mg total) by mouth daily. Take with or immediately following a meal. Patient taking differently: Take 50 mg by mouth daily. Take with or immediately following a meal. 11/21/22   Osvaldo Shipper, MD  nitroGLYCERIN (NITROSTAT) 0.4 MG SL tablet Place 1 tablet (0.4 mg total) under the tongue every 5 (five) minutes as needed for chest pain. 04/07/22   Nada Libman, MD  ondansetron (ZOFRAN) 4 MG tablet Take 4 mg by mouth 3 (three) times daily as needed for nausea. 06/03/21   [provider]  oxyCODONE-acetaminophen (PERCOCET/ROXICET) 5-325 MG tablet Take 1 tablet by mouth 3 (three) times daily as needed for moderate pain or severe pain. 06/05/21   [provider]  pantoprazole (PROTONIX) 40 MG tablet Take 40 mg by mouth daily. 10/08/22   [provider]  rosuvastatin (CRESTOR) 40 MG tablet TAKE 1 TABLET BY MOUTH AT BEDTIME 04/06/23   Jonelle Sidle, MD  sertraline (ZOLOFT)  50 MG tablet Take 50 mg by mouth daily. 05/28/21   [provider]  vitamin B-12 (CYANOCOBALAMIN) 1000 MCG tablet Take 2 tablets (2,000 mcg total) by mouth daily. Patient taking differently: Take 1,000 mcg by mouth daily. 03/13/20   Glade Lloyd, MD  Vitamin D, Ergocalciferol, (DRISDOL) 50000 UNITS CAPS Take 50,000 Units  by mouth every Monday.     [provider]    Physical Exam    Vital Signs:  DAZIE SUHRE does not have vital signs available for review today.  Given telephonic nature of communication, physical exam is limited. AAOx3. NAD. Normal affect.  Speech and respirations are unlabored.  Accessory Clinical Findings    None  Assessment & Plan    1.  Preoperative Cardiovascular Risk Assessment: According to the Revised Cardiac Risk Index (RCRI), her Perioperative Risk of Major Cardiac Event is (%): 0.9  Her   according to the Duke Activity Status Index (DASI).   The patient was advised that if she develops new symptoms prior to surgery to contact our office to arrange for a follow-up visit, and she verbalized understanding.  Per Dr. Myra Gianotti note 04/06/2023 "I discussed the risks and benefits of the procedure with the patient including the risk of stroke and nerve injury. All questions were answered. I will not stop her dual antiplatelet therapy (aspirin and Plavix) (sig) preoperatively."  A copy of this note will be routed to requesting surgeon.  Time:   Today, I have spent 0 minutes with the patient with telehealth technology discussing medical history, symptoms, and management plan.  Patient NO SHOW   Heather Reining, NP  04/21/2023, 9:37 AM

## 2023-04-21 ENCOUNTER — Ambulatory Visit: Payer: 59 | Attending: Cardiovascular Disease

## 2023-04-21 DIAGNOSIS — Z91199 Patient's noncompliance with other medical treatment and regimen due to unspecified reason: Secondary | ICD-10-CM

## 2023-04-23 ENCOUNTER — Telehealth: Payer: Self-pay

## 2023-04-23 NOTE — Telephone Encounter (Signed)
Noted patient no showed for telephone visit with cardiology on 10/8. Attempted to reach patient to advise her to contact cardiology office to reschedule missed appointment, as this is required prior to scheduling carotid surgery with Dr. Myra Gianotti. Left detailed message per DPR with this information. Contact number for cardiology office and [facility] office provided

## 2023-04-24 ENCOUNTER — Telehealth: Payer: Self-pay | Admitting: Cardiology

## 2023-04-24 NOTE — Telephone Encounter (Signed)
Pt would like a callback to reschedule Telephone Appt that was on 10/8. Please advise

## 2023-04-24 NOTE — Telephone Encounter (Signed)
Pt missed her tele appt 04/21/23 and has been rescheduled to 05/06/23. I will update all parties involved.

## 2023-04-24 NOTE — Telephone Encounter (Signed)
Received a call from patient requesting to schedule surgery. Explained previous message regarding contacting cardiology to reschedule missed appt and once cleared, our office can schedule her surgery. Provided patient with [facility] number. She voiced understanding and appreciation.

## 2023-05-03 DIAGNOSIS — R079 Chest pain, unspecified: Secondary | ICD-10-CM | POA: Diagnosis not present

## 2023-05-03 DIAGNOSIS — Z7982 Long term (current) use of aspirin: Secondary | ICD-10-CM | POA: Diagnosis not present

## 2023-05-03 DIAGNOSIS — R0602 Shortness of breath: Secondary | ICD-10-CM | POA: Diagnosis not present

## 2023-05-03 DIAGNOSIS — Z888 Allergy status to other drugs, medicaments and biological substances status: Secondary | ICD-10-CM | POA: Diagnosis not present

## 2023-05-03 DIAGNOSIS — Z880 Allergy status to penicillin: Secondary | ICD-10-CM | POA: Diagnosis not present

## 2023-05-03 DIAGNOSIS — J849 Interstitial pulmonary disease, unspecified: Secondary | ICD-10-CM | POA: Diagnosis not present

## 2023-05-03 DIAGNOSIS — Z9049 Acquired absence of other specified parts of digestive tract: Secondary | ICD-10-CM | POA: Diagnosis not present

## 2023-05-03 DIAGNOSIS — I700 Atherosclerosis of aorta: Secondary | ICD-10-CM | POA: Diagnosis not present

## 2023-05-03 DIAGNOSIS — Z881 Allergy status to other antibiotic agents status: Secondary | ICD-10-CM | POA: Diagnosis not present

## 2023-05-03 DIAGNOSIS — Z79899 Other long term (current) drug therapy: Secondary | ICD-10-CM | POA: Diagnosis not present

## 2023-05-06 ENCOUNTER — Ambulatory Visit: Payer: 59 | Attending: Cardiovascular Disease

## 2023-05-06 DIAGNOSIS — Z01810 Encounter for preprocedural cardiovascular examination: Secondary | ICD-10-CM | POA: Diagnosis not present

## 2023-05-06 NOTE — Progress Notes (Signed)
Virtual Visit via Telephone Note   Because of [name]'s co-morbid illnesses, she is at least at moderate risk for complications without adequate follow up.  This format is felt to be most appropriate for this patient at this time.  The patient did not have access to video technology/had technical difficulties with video requiring transitioning to audio format only (telephone).  All issues noted in this document were discussed and addressed.  No physical exam could be performed with this format.  Please refer to the patient's chart for her consent to telehealth for [facility].  Evaluation Performed:  Preoperative cardiovascular risk assessment _____________   Date:  05/06/2023   Patient ID:  [name], DOB [birth year], MRN [id number] Patient Location:  Home Provider location:   Office  Primary Care Provider:  Ignatius Specking, MD Primary Cardiologist:  Nona Dell, MD  Chief Complaint / Patient Profile   72 y.o. y/o female with a h/o coronary artery disease, PAD, HLD, HTN, CKD stage III who is pending Right Carotid Endarterectomy  and presents today for telephonic preoperative cardiovascular risk assessment.  History of Present Illness    [name] is a 72 y.o. female who presents via audio/video conferencing for a telehealth visit today.  Pt was last seen in cardiology clinic on 12/01/2022 by Sharlene Dory, NP.  At that time [name] was doing well .  The patient is now pending procedure as outlined above. Since her last visit, she remained stable from a cardiac standpoint.  Today she denies chest pain, shortness of breath, lower extremity edema, fatigue, palpitations, melena, hematuria, hemoptysis, diaphoresis, weakness, presyncope, syncope, orthopnea, and PND.   Past Medical History    Past Medical History:  Diagnosis Date   Abnormal chest CT    Anxiety    Arthritis    Rhumatoid and Osteoarthritis   Carotid artery disease (HCC)    Dr.  Myra Gianotti, s/p left CEA, severe R ICA stenosis   Coronary atherosclerosis of native coronary artery    DES RCA/circumflex 4/10, LVEF 65 -> repeat cath 11/2021 with multivessel CAD including LM stenosis, initial med rx   Depression    Essential hypertension    Fibromyalgia    GERD (gastroesophageal reflux disease)    Hiatal hernia    Hyperlipidemia    Kidney cysts    NSTEMI (non-ST elevated myocardial infarction) (HCC)    4/10   Renal artery stenosis (HCC)    Left renal artery stent 12/13   Restless leg syndrome    Spondylosis without myelopathy    Tobacco abuse    Past Surgical History:  Procedure Laterality Date   ABDOMINAL HYSTERECTOMY  1977   BREAST LUMPECTOMY     CAROTID ENDARTERECTOMY Left 02/28/2009   CHOLECYSTECTOMY  1991   COLONOSCOPY     COLONOSCOPY N/A 05/08/2017   Procedure: COLONOSCOPY;  Surgeon: Malissa Hippo, MD;  [facility];  Service: Endoscopy;  Laterality: N/A;  12:00   CORONARY ATHERECTOMY N/A 11/19/2022   Procedure: CORONARY ATHERECTOMY;  Surgeon: Iran Ouch, MD;  [facility];  Service: Cardiovascular;  Laterality: N/A;   CORONARY STENT INTERVENTION N/A 11/19/2022   Procedure: CORONARY STENT INTERVENTION;  Surgeon: Iran Ouch, MD;  [facility];  Service: Cardiovascular;  Laterality: N/A;   CORONARY ULTRASOUND/IVUS N/A 11/19/2022   Procedure: Coronary Ultrasound/IVUS;  Surgeon: Iran Ouch, MD;  [facility];  Service: Cardiovascular;  Laterality: N/A;  ESOPHAGOGASTRODUODENOSCOPY N/A 04/07/2013   Procedure: ESOPHAGOGASTRODUODENOSCOPY (EGD);  Surgeon: Malissa Hippo, MD;  [facility];  Service: Endoscopy;  Laterality: N/A;  250   ESOPHAGOGASTRODUODENOSCOPY N/A 02/02/2014   Procedure: ESOPHAGOGASTRODUODENOSCOPY (EGD);  Surgeon: Malissa Hippo, MD;  [facility];  Service: Endoscopy;  Laterality: N/A;  120   EYE SURGERY     Catartact bilateral   JOINT REPLACEMENT   March 2013   Left knee   JOINT REPLACEMENT  2006   Left Knee   kidney stent     Left December 2013   LEFT HEART CATH AND CORONARY ANGIOGRAPHY N/A 11/13/2021   Procedure: LEFT HEART CATH AND CORONARY ANGIOGRAPHY;  Surgeon: Iran Ouch, MD;  [facility];  Service: Cardiovascular;  Laterality: N/A;   LEFT HEART CATH AND CORONARY ANGIOGRAPHY N/A 11/18/2022   Procedure: LEFT HEART CATH AND CORONARY ANGIOGRAPHY;  Surgeon: Marykay Lex, MD;  Location: [facility] INVASIVE CV LAB;  Service: Cardiovascular;  Laterality: N/A;   LUMBAR DISC SURGERY     L5-S1   MULTIPLE TOOTH EXTRACTIONS  Jan. 2015   Neck Fusion  1996   C4 to C6   PERCUTANEOUS STENT INTERVENTION Left 06/15/2012   Procedure: PERCUTANEOUS STENT INTERVENTION;  Surgeon: Nada Libman, MD;  Location: [facility] CATH LAB;  Service: Cardiovascular;  Laterality: Left;  renal   PERIPHERAL VASCULAR BALLOON ANGIOPLASTY  05/26/2017   Procedure: PERIPHERAL VASCULAR BALLOON ANGIOPLASTY;  Surgeon: Nada Libman, MD;  [facility];  Service: Cardiovascular;;  Lt. Renal Angioplasty   POLYPECTOMY  05/08/2017   Procedure: POLYPECTOMY;  Surgeon: Malissa Hippo, MD;  [facility];  Service: Endoscopy;;  colon    RENAL ANGIOGRAM N/A 06/15/2012   Procedure: RENAL ANGIOGRAM;  Surgeon: Nada Libman, MD;  [facility] CATH LAB;  Service: Cardiovascular;  Laterality: N/A;   RENAL ANGIOGRAPHY  05/26/2017   Procedure: RENAL ANGIOGRAPHY;  Surgeon: Nada Libman, MD;  [facility];  Service: Cardiovascular;;   SPINE SURGERY  1994   Ruptured disc   TEMPORARY PACEMAKER N/A 11/19/2022   Procedure: TEMPORARY PACEMAKER;  Surgeon: Iran Ouch, MD;  [facility];  Service: Cardiovascular;  Laterality: N/A;   TONSILECTOMY, ADENOIDECTOMY, BILATERAL MYRINGOTOMY AND TUBES  1969   TOTAL KNEE ARTHROPLASTY     Left   TUBAL LIGATION      Allergies  Allergies  Allergen Reactions    Celecoxib Itching and Other (See Comments)    Other Reaction(s): Not available   Citalopram Other (See Comments) and Palpitations    Heart rate increased   Morphine And Codeine Other (See Comments) and Itching    Patient states she is not allergic to morphine; states had morphine with no reaction   Ciprofloxacin Hcl Other (See Comments)    Mya have made her sick or bad diarrhea   Dicyclomine Diarrhea and Other (See Comments)    Made diarrhea worse   Lamotrigine Other (See Comments) and Diarrhea    Unknown   Metoclopramide Other (See Comments)    Dizziness   Milnacipran Hcl Other (See Comments)    Unknown   Omeprazole Other (See Comments)   Pregabalin Other (See Comments)   Serotonin Reuptake Inhibitors (Ssris) Other (See Comments)   Simvastatin Other (See Comments)    Increased back and muscle pain   Tizanidine Other (See Comments)    unknown   Trazodone And Nefazodone Other (See Comments)  hallucinations   Venlafaxine Other (See Comments)    Affected eyes, blurred vision   Codeine Itching   Morphine Other (See Comments)   Nefazodone Other (See Comments)   Tape Other (See Comments)    Skin gets red and skin pulls off, paper tape only   Sertraline Hcl Other (See Comments)    Stomach upset. Pt takes this med at home 11/2022    Home Medications    Prior to Admission medications   Medication Sig Start Date End Date Taking? Authorizing Provider  albuterol (VENTOLIN HFA) 108 (90 Base) MCG/ACT inhaler 1 puff 4 (four) times daily as needed for wheezing or shortness of breath. 04/22/21   [provider]  ALPRAZolam Prudy Feeler) 0.5 MG tablet Take 0.5 mg by mouth 3 (three) times daily as needed. 11/14/22   [provider]  amLODipine (NORVASC) 10 MG tablet Take 1 tablet (10 mg total) by mouth daily. 11/21/22   Osvaldo Shipper, MD  amoxicillin-clavulanate (AUGMENTIN) 500-125 MG tablet Take 1 tablet by mouth 2 (two) times daily. 04/02/23   [provider]  aspirin  81 MG EC tablet Take 81 mg by mouth daily.    [provider]  clopidogrel (PLAVIX) 75 MG tablet Take 1 tablet (75 mg total) by mouth daily. 12/25/22   Jonelle Sidle, MD  diclofenac Sodium (VOLTAREN) 1 % GEL Apply 2 g topically 3 (three) times daily.    [provider]  DULoxetine (CYMBALTA) 20 MG capsule Take 20 mg by mouth at bedtime. 05/28/21   [provider]  EPINEPHrine (EPIPEN JR) 0.15 MG/0.3ML injection Inject 0.15 mg into the muscle as needed for anaphylaxis.    [provider]  isosorbide mononitrate (IMDUR) 60 MG 24 hr tablet Take 1 tablet (60 mg total) by mouth daily. Patient taking differently: Take 30 mg by mouth daily. 11/21/22   Osvaldo Shipper, MD  levothyroxine (SYNTHROID) 25 MCG tablet Take 25 mcg by mouth daily before breakfast. 02/16/20   [provider]  metoprolol succinate (TOPROL-XL) 100 MG 24 hr tablet Take 1 tablet (100 mg total) by mouth daily. Take with or immediately following a meal. Patient taking differently: Take 50 mg by mouth daily. Take with or immediately following a meal. 11/21/22   Osvaldo Shipper, MD  nitroGLYCERIN (NITROSTAT) 0.4 MG SL tablet Place 1 tablet (0.4 mg total) under the tongue every 5 (five) minutes as needed for chest pain. 04/07/22   Nada Libman, MD  ondansetron (ZOFRAN) 4 MG tablet Take 4 mg by mouth 3 (three) times daily as needed for nausea. 06/03/21   [provider]  oxyCODONE-acetaminophen (PERCOCET/ROXICET) 5-325 MG tablet Take 1 tablet by mouth 3 (three) times daily as needed for moderate pain or severe pain. 06/05/21   [provider]  pantoprazole (PROTONIX) 40 MG tablet Take 40 mg by mouth daily. 10/08/22   [provider]  rosuvastatin (CRESTOR) 40 MG tablet TAKE 1 TABLET BY MOUTH AT BEDTIME 04/06/23   Jonelle Sidle, MD  sertraline (ZOLOFT) 50 MG tablet Take 50 mg by mouth daily. 05/28/21   [provider]  vitamin B-12 (CYANOCOBALAMIN) 1000 MCG  tablet Take 2 tablets (2,000 mcg total) by mouth daily. Patient taking differently: Take 1,000 mcg by mouth daily. 03/13/20   Glade Lloyd, MD  Vitamin D, Ergocalciferol, (DRISDOL) 50000 UNITS CAPS Take 50,000 Units by mouth every Monday.     [provider]    Physical Exam    Vital Signs:  [name] does  not have vital signs available for review today.  Given telephonic nature of communication, physical exam is limited. AAOx3. NAD. Normal affect.  Speech and respirations are unlabored.  Accessory Clinical Findings    None  Assessment & Plan    1.  Preoperative Cardiovascular Risk Assessment:Right Carotid Endarterectomy , Dr. Myra Gianotti[facility], fax #[phone]      Primary Cardiologist: Nona Dell, MD  Chart reviewed as part of pre-operative protocol coverage. Given past medical history and time since last visit, based on ACC/AHA guidelines, [name] would be at acceptable risk for the planned procedure without further cardiovascular testing.   Her RCRI is moderate risk, 6.6% risk of major cardiac event.  She is able to complete greater than 4 METS of physical activity.  Patient was advised that if she develops new symptoms prior to surgery to contact our office to arrange a follow-up appointment.  She verbalized understanding.  I will route this recommendation to the requesting party via Epic fax function and remove from pre-op pool.       Time:   Today, I have spent 8 minutes with the patient with telehealth technology discussing medical history, symptoms, and management plan.  Prior to patient's phone evaluation I spent greater than 10 minutes reviewing their past medical history and cardiac medications.    Ronney Asters, NP  05/06/2023, 7:01 AM

## 2023-05-07 DIAGNOSIS — N1832 Chronic kidney disease, stage 3b: Secondary | ICD-10-CM | POA: Diagnosis not present

## 2023-05-07 DIAGNOSIS — R413 Other amnesia: Secondary | ICD-10-CM | POA: Diagnosis not present

## 2023-05-07 DIAGNOSIS — I10 Essential (primary) hypertension: Secondary | ICD-10-CM | POA: Diagnosis not present

## 2023-05-07 DIAGNOSIS — Z299 Encounter for prophylactic measures, unspecified: Secondary | ICD-10-CM | POA: Diagnosis not present

## 2023-05-07 DIAGNOSIS — M159 Polyosteoarthritis, unspecified: Secondary | ICD-10-CM | POA: Diagnosis not present

## 2023-05-12 ENCOUNTER — Other Ambulatory Visit: Payer: Self-pay

## 2023-05-12 DIAGNOSIS — I6523 Occlusion and stenosis of bilateral carotid arteries: Secondary | ICD-10-CM

## 2023-05-29 ENCOUNTER — Telehealth: Payer: Self-pay

## 2023-05-29 NOTE — Patient Outreach (Signed)
[facility] Assistant attempted to call patient on today regarding preventative mammogram screening. No answer from patient after multiple rings. Assistant left confidential voicemail for patient to return call.  Will call back patient back for final attempt.  Baruch Gouty [facility] Assistant [facility] [phone]

## 2023-06-01 ENCOUNTER — Telehealth: Payer: Self-pay

## 2023-06-01 NOTE — Patient Outreach (Signed)
Successful incoming call today regarding preventative mammogram screening. Spoke with patients daughter who shared patient scheduled in December for screening.  Baruch Gouty [facility] Assistant [facility] [phone]

## 2023-06-08 ENCOUNTER — Other Ambulatory Visit: Payer: Self-pay | Admitting: Surgery

## 2023-06-09 ENCOUNTER — Other Ambulatory Visit: Payer: Self-pay

## 2023-06-09 ENCOUNTER — Encounter (HOSPITAL_COMMUNITY): Payer: Self-pay

## 2023-06-09 ENCOUNTER — Emergency Department (HOSPITAL_COMMUNITY)
Admission: EM | Admit: 2023-06-09 | Discharge: 2023-06-10 | Disposition: A | Discharge status: Home / Self Care | Payer: 59 | Attending: Emergency Medicine | Admitting: Emergency Medicine

## 2023-06-09 DIAGNOSIS — F22 Delusional disorders: Secondary | ICD-10-CM

## 2023-06-09 DIAGNOSIS — Z7901 Long term (current) use of anticoagulants: Secondary | ICD-10-CM | POA: Insufficient documentation

## 2023-06-09 DIAGNOSIS — Z7982 Long term (current) use of aspirin: Secondary | ICD-10-CM | POA: Insufficient documentation

## 2023-06-09 DIAGNOSIS — E039 Hypothyroidism, unspecified: Secondary | ICD-10-CM | POA: Diagnosis not present

## 2023-06-09 DIAGNOSIS — Z79899 Other long term (current) drug therapy: Secondary | ICD-10-CM | POA: Diagnosis not present

## 2023-06-09 DIAGNOSIS — I2510 Atherosclerotic heart disease of native coronary artery without angina pectoris: Secondary | ICD-10-CM | POA: Diagnosis not present

## 2023-06-09 DIAGNOSIS — F410 Panic disorder [episodic paroxysmal anxiety] without agoraphobia: Secondary | ICD-10-CM | POA: Insufficient documentation

## 2023-06-09 DIAGNOSIS — F411 Generalized anxiety disorder: Secondary | ICD-10-CM | POA: Diagnosis present

## 2023-06-09 DIAGNOSIS — I129 Hypertensive chronic kidney disease with stage 1 through stage 4 chronic kidney disease, or unspecified chronic kidney disease: Secondary | ICD-10-CM | POA: Insufficient documentation

## 2023-06-09 DIAGNOSIS — Z638 Other specified problems related to primary support group: Secondary | ICD-10-CM | POA: Diagnosis not present

## 2023-06-09 DIAGNOSIS — N1832 Chronic kidney disease, stage 3b: Secondary | ICD-10-CM | POA: Insufficient documentation

## 2023-06-09 DIAGNOSIS — Z658 Other specified problems related to psychosocial circumstances: Secondary | ICD-10-CM

## 2023-06-09 LAB — COMPREHENSIVE METABOLIC PANEL
ALT: 21 U/L (ref 0–44)
AST: 44 U/L — ABNORMAL HIGH (ref 15–41)
Albumin: 4.1 g/dL (ref 3.5–5.0)
Alkaline Phosphatase: 64 U/L (ref 38–126)
Anion gap: 10 (ref 5–15)
BUN: 24 mg/dL — ABNORMAL HIGH (ref 8–23)
CO2: 22 mmol/L (ref 22–32)
Calcium: 9 mg/dL (ref 8.9–10.3)
Chloride: 107 mmol/L (ref 98–111)
Creatinine, Ser: 1.14 mg/dL — ABNORMAL HIGH (ref 0.44–1.00)
Glucose, Bld: 163 mg/dL — ABNORMAL HIGH (ref 70–99)
Potassium: 3.4 mmol/L — ABNORMAL LOW (ref 3.5–5.1)
Sodium: 139 mmol/L (ref 135–145)
Total Bilirubin: 0.7 mg/dL (ref <1.2)
Total Protein: 7.4 g/dL (ref 6.5–8.1)

## 2023-06-09 LAB — CBC WITH DIFFERENTIAL/PLATELET
Abs Immature Granulocytes: 0.02 10*3/uL (ref 0.00–0.07)
Basophils Absolute: 0 10*3/uL (ref 0.0–0.1)
Basophils Relative: 1 %
Eosinophils Absolute: 0.1 10*3/uL (ref 0.0–0.5)
Eosinophils Relative: 2 %
HCT: 38.4 % (ref 36.0–46.0)
Hemoglobin: 12.8 g/dL (ref 12.0–15.0)
Immature Granulocytes: 0 %
Lymphocytes Relative: 26 %
Lymphs Abs: 1.8 10*3/uL (ref 0.7–4.0)
MCH: 31.8 pg (ref 26.0–34.0)
MCHC: 33.3 g/dL (ref 30.0–36.0)
MCV: 95.5 fL (ref 80.0–100.0)
Monocytes Absolute: 0.6 10*3/uL (ref 0.1–1.0)
Monocytes Relative: 9 %
Neutro Abs: 4.2 10*3/uL (ref 1.7–7.7)
Neutrophils Relative %: 62 %
Platelets: 216 10*3/uL (ref 150–400)
RBC: 4.02 MIL/uL (ref 3.87–5.11)
RDW: 12.9 % (ref 11.5–15.5)
WBC: 6.8 10*3/uL (ref 4.0–10.5)
nRBC: 0 % (ref 0.0–0.2)

## 2023-06-09 LAB — ETHANOL: Alcohol, Ethyl (B): <10 mg/dL (ref <10)

## 2023-06-09 MED ORDER — PANTOPRAZOLE SODIUM 40 MG PO TBEC: 40.0000 mg | DELAYED_RELEASE_TABLET | Freq: Every day | ORAL | Status: DC

## 2023-06-09 MED ORDER — VITAMIN D (ERGOCALCIFEROL) 1.25 MG (50000 UNIT) PO CAPS: 50000.0000 [IU] | ORAL_CAPSULE | ORAL | Status: DC

## 2023-06-09 MED ORDER — CLOPIDOGREL BISULFATE 75 MG PO TABS: 75.0000 mg | ORAL_TABLET | Freq: Every day | ORAL | Status: DC

## 2023-06-09 MED ORDER — ONDANSETRON HCL 4 MG PO TABS: 4.0000 mg | ORAL_TABLET | Freq: Three times a day (TID) | ORAL | Status: DC | PRN

## 2023-06-09 MED ORDER — ALPRAZOLAM 0.5 MG PO TABS
0.5000 mg | ORAL_TABLET | Freq: Three times a day (TID) | ORAL | Status: DC | PRN
  Administered 2023-06-09: 0.5 mg via ORAL
  Filled 2023-06-09: qty 1

## 2023-06-09 MED ORDER — DICLOFENAC SODIUM 1 % EX GEL: 2.0000 g | Freq: Three times a day (TID) | CUTANEOUS | Status: DC | PRN

## 2023-06-09 MED ORDER — VITAMIN B-12 1000 MCG PO TABS: 1000.0000 ug | ORAL_TABLET | Freq: Every day | ORAL | Status: DC

## 2023-06-09 MED ORDER — DULOXETINE HCL 20 MG PO CPEP
20.0000 mg | ORAL_CAPSULE | Freq: Every day | ORAL | Status: DC
  Administered 2023-06-09: 20 mg via ORAL
  Filled 2023-06-09: qty 1

## 2023-06-09 MED ORDER — LEVOTHYROXINE SODIUM 50 MCG PO TABS: 25.0000 ug | ORAL_TABLET | Freq: Every day | ORAL | Status: DC

## 2023-06-09 MED ORDER — LEVOTHYROXINE SODIUM 50 MCG PO TABS
25.0000 ug | ORAL_TABLET | Freq: Every day | ORAL | Status: DC
  Administered 2023-06-10: 25 ug via ORAL
  Filled 2023-06-09: qty 1

## 2023-06-09 MED ORDER — METOPROLOL SUCCINATE ER 50 MG PO TB24: 50.0000 mg | ORAL_TABLET | Freq: Every day | ORAL | Status: DC

## 2023-06-09 MED ORDER — ROSUVASTATIN CALCIUM 20 MG PO TABS
40.0000 mg | ORAL_TABLET | Freq: Every day | ORAL | Status: DC
  Administered 2023-06-09: 40 mg via ORAL
  Filled 2023-06-09: qty 2

## 2023-06-09 MED ORDER — ISOSORBIDE MONONITRATE ER 30 MG PO TB24: 30.0000 mg | ORAL_TABLET | Freq: Every day | ORAL | Status: DC

## 2023-06-09 MED ORDER — AMLODIPINE BESYLATE 5 MG PO TABS: 10.0000 mg | ORAL_TABLET | Freq: Every day | ORAL | Status: DC

## 2023-06-09 MED ORDER — DIPHENHYDRAMINE HCL 25 MG PO CAPS: 25.0000 mg | ORAL_CAPSULE | Freq: Four times a day (QID) | ORAL | Status: DC | PRN

## 2023-06-09 MED ORDER — SERTRALINE HCL 50 MG PO TABS: 50.0000 mg | ORAL_TABLET | Freq: Every day | ORAL | Status: DC

## 2023-06-09 NOTE — ED Triage Notes (Addendum)
Pt brought in by [facility] dept under IVC that states pt is seeing things & people that are not present. Pt becomes agitated very easy, petitioner is concerted that the agitation may become violent.  Pt denies any complaints at time of triage. Denies visual or auditory hallucinations, SI or HI. Pt states she has had decreased appetite for the past 3 days.  Denies trouble sleeping.

## 2023-06-09 NOTE — ED Provider Notes (Signed)
[place] EMERGENCY DEPARTMENT AT [facility] Provider Note  CSN: [id number] Arrival date & time: 06/09/23 2038  Chief Complaint(s) No chief complaint on file.  HPI [name] is a 72 y.o. female here today on an IVC placed by the patient's daughter.  Patient's daughter reports that the patient has seemed increasingly paranoid since being evicted from her [address] 3 weeks ago and coming to stay with her.  Reportedly, the patient was evicted after having frequent altercations with a neighbor.  Patient has been living with her daughter since.  Daughter states the patient has been making claims such as she wants to get a gun and shoot people.  Patient denies these claims.  Daughter says that the patient has been "paranoid.  Daughter is worried that the patient has developed schizophrenia.  She says that the patient has been taking more of her Xanax than she is supposed to.   Past Medical History Past Medical History:  Diagnosis Date   Abnormal chest CT    Anxiety    Arthritis    Rhumatoid and Osteoarthritis   Carotid artery disease (HCC)    Dr. Myra Gianotti, s/p left CEA, severe R ICA stenosis   Coronary atherosclerosis of native coronary artery    DES RCA/circumflex 4/10, LVEF 65 -> repeat cath 11/2021 with multivessel CAD including LM stenosis, initial med rx   Depression    Essential hypertension    Fibromyalgia    GERD (gastroesophageal reflux disease)    Hiatal hernia    Hyperlipidemia    Kidney cysts    NSTEMI (non-ST elevated myocardial infarction) (HCC)    4/10   Renal artery stenosis (HCC)    Left renal artery stent 12/13   Restless leg syndrome    Spondylosis without myelopathy    Tobacco abuse    Patient Active Problem List   Diagnosis Date Noted   Acute coronary syndrome with high troponin (HCC) 11/20/2022   Chronic kidney disease stage IIIb 11/18/2022   Obesity (BMI 30-39.9) 11/18/2022   Rheumatoid arthritis (HCC) 11/17/2022   Fibromyalgia 11/17/2022    Hypothyroidism 11/17/2022   Progressive angina with severe left main stenosis 11/16/2022   IPF (idiopathic pulmonary fibrosis) (HCC) 02/25/2022   Abdominal pain, epigastric 11/26/2021   Nausea without vomiting 11/26/2021   Constipation 11/26/2021   Tobacco abuse 11/16/2021   Abnormal chest CT 11/16/2021   Abnormal nuclear cardiac imaging test    Mixed incontinence 07/16/2021   History of UTI 07/16/2021   AMS (altered mental status) 03/11/2020   AKI (acute kidney injury) (HCC) 03/11/2020   Hypokalemia 03/11/2020   Altered mental status 03/11/2020   Depression    Prolonged QT interval    Unilateral primary osteoarthritis, right knee 02/22/2020   Gastroesophageal reflux disease without esophagitis 01/25/2018   Encounter for colonoscopy due to history of adenomatous colonic polyps 03/23/2017   Melena 01/31/2014   Aftercare following surgery of the circulatory system, NEC 01/30/2014   Tooth pain 05/03/2013   GERD (gastroesophageal reflux disease) 02/16/2013   Bloating 02/16/2013   Renal artery stenosis (HCC) 11/04/2012   PAD (peripheral artery disease) (HCC) 05/07/2012   Shortness of breath 04/14/2012   Carotid artery stenosis 11/13/2009   Hyperlipidemia 04/19/2009   Essential hypertension 03/12/2009   CAD (coronary artery disease) 03/12/2009   Home Medication(s) Prior to Admission medications   Medication Sig Start Date End Date Taking? Authorizing Provider  predniSONE (DELTASONE) 5 MG tablet Take 5 mg by mouth 2 (two) times daily. 05/07/23  Yes  [provider]  traZODone (DESYREL) 50 MG tablet Take 50 mg by mouth at bedtime. 05/26/23  Yes [provider]  albuterol (VENTOLIN HFA) 108 (90 Base) MCG/ACT inhaler 1 puff 4 (four) times daily as needed for wheezing or shortness of breath. 04/22/21   [provider]  ALPRAZolam Prudy Feeler) 0.5 MG tablet Take 0.5 mg by mouth 3 (three) times daily as needed. 11/14/22   [provider]  amLODipine (NORVASC) 10  MG tablet Take 1 tablet (10 mg total) by mouth daily. 11/21/22   Osvaldo Shipper, MD  amoxicillin-clavulanate (AUGMENTIN) 500-125 MG tablet Take 1 tablet by mouth 2 (two) times daily. 04/02/23   [provider]  aspirin 81 MG EC tablet Take 81 mg by mouth daily.    [provider]  clopidogrel (PLAVIX) 75 MG tablet Take 1 tablet (75 mg total) by mouth daily. 12/25/22   Jonelle Sidle, MD  diclofenac Sodium (VOLTAREN) 1 % GEL Apply 2 g topically 3 (three) times daily.    [provider]  DULoxetine (CYMBALTA) 20 MG capsule Take 20 mg by mouth at bedtime. 05/28/21   [provider]  EPINEPHrine (EPIPEN JR) 0.15 MG/0.3ML injection Inject 0.15 mg into the muscle as needed for anaphylaxis.    [provider]  isosorbide mononitrate (IMDUR) 60 MG 24 hr tablet Take 1 tablet (60 mg total) by mouth daily. Patient taking differently: Take 30 mg by mouth daily. 11/21/22   Osvaldo Shipper, MD  levothyroxine (SYNTHROID) 25 MCG tablet Take 25 mcg by mouth daily before breakfast. 02/16/20   [provider]  metoprolol succinate (TOPROL-XL) 100 MG 24 hr tablet Take 1 tablet (100 mg total) by mouth daily. Take with or immediately following a meal. Patient taking differently: Take 50 mg by mouth daily. Take with or immediately following a meal. 11/21/22   Osvaldo Shipper, MD  nitroGLYCERIN (NITROSTAT) 0.4 MG SL tablet Place 1 tablet (0.4 mg total) under the tongue every 5 (five) minutes x 3 doses as needed for chest pain (if no relief after 2nd dose, proceed to ED or call 911). 06/08/23   Jonelle Sidle, MD  ondansetron (ZOFRAN) 4 MG tablet Take 4 mg by mouth 3 (three) times daily as needed for nausea. 06/03/21   [provider]  oxyCODONE-acetaminophen (PERCOCET/ROXICET) 5-325 MG tablet Take 1 tablet by mouth 3 (three) times daily as needed for moderate pain or severe pain. 06/05/21   [provider]  pantoprazole (PROTONIX) 40 MG tablet Take 40  mg by mouth daily. 10/08/22   [provider]  rosuvastatin (CRESTOR) 40 MG tablet TAKE 1 TABLET BY MOUTH AT BEDTIME 04/06/23   Jonelle Sidle, MD  sertraline (ZOLOFT) 50 MG tablet Take 50 mg by mouth daily. 05/28/21   [provider]  vitamin B-12 (CYANOCOBALAMIN) 1000 MCG tablet Take 2 tablets (2,000 mcg total) by mouth daily. Patient taking differently: Take 1,000 mcg by mouth daily. 03/13/20   Glade Lloyd, MD  Vitamin D, Ergocalciferol, (DRISDOL) 50000 UNITS CAPS Take 50,000 Units by mouth every Monday.     [provider]  Past Surgical History Past Surgical History:  Procedure Laterality Date   ABDOMINAL HYSTERECTOMY  1977   BREAST LUMPECTOMY     CAROTID ENDARTERECTOMY Left 02/28/2009   CHOLECYSTECTOMY  1991   COLONOSCOPY     COLONOSCOPY N/A 05/08/2017   Procedure: COLONOSCOPY;  Surgeon: Malissa Hippo, MD;  Location: [facility];  Service: Endoscopy;  Laterality: N/A;  12:00   CORONARY ATHERECTOMY N/A 11/19/2022   Procedure: CORONARY ATHERECTOMY;  Surgeon: Iran Ouch, MD;  Location: [facility] INVASIVE CV LAB;  Service: Cardiovascular;  Laterality: N/A;   CORONARY STENT INTERVENTION N/A 11/19/2022   Procedure: CORONARY STENT INTERVENTION;  Surgeon: Iran Ouch, MD;  Location: [facility] INVASIVE CV LAB;  Service: Cardiovascular;  Laterality: N/A;   CORONARY ULTRASOUND/IVUS N/A 11/19/2022   Procedure: Coronary Ultrasound/IVUS;  Surgeon: Iran Ouch, MD;  Location: [facility] INVAS[facility] CV LAB;  Service: Cardiovascular;  Laterality: N/A;   ESOPHAGOGASTRODUODENOSCOPY N/A 04/07/2013   Procedure: ESOPHAGOGASTRODUODENOSCOPY (EGD);  Surgeon: Malissa Hippo, MD;  Location: [facility];  Service: Endoscopy;  Laterality: N/A;  250   ESOPHAGOGASTRODUODENOSCOPY N/A 02/02/2014   Procedure: ESOPHAGOGASTRODUODENOSCOPY (EGD);  Surgeon: Malissa Hippo, MD;  Location: [facility];  Service: Endoscopy;  Laterality: N/A;  120   EYE SURGERY     Catartact bilateral   JOINT REPLACEMENT  March 2013   Left knee   JOINT REPLACEMENT  2006   Left Knee   kidney stent     Left December 2013   LEFT HEART CATH AND CORONARY ANGIOGRAPHY N/A 11/13/2021   Procedure: LEFT HEART CATH AND CORONARY ANGIOGRAPHY;  Surgeon: Iran Ouch, MD;  Location: [facility] CV LAB;  Service: Cardiovascular;  Laterality: N/A;   LEFT HEART CATH AND CORONARY ANGIOGRAPHY N/A 11/18/2022   Procedure: LEFT HEART CATH AND CORONARY ANGIOGRAPHY;  Surgeon: Marykay Lex, MD;  Location: [facility] INVASIVE CV LAB;  Service: Cardiovascular;  Laterality: N/A;   LUMBAR DISC SURGERY     L5-S1   MULTIPLE TOOTH EXTRACTIONS  Jan. 2015   Neck Fusion  1996   C4 to C6   PERCUTANEOUS STENT INTERVENTION Left 06/15/2012   Procedure: PERCUTANEOUS STENT INTERVENTION;  Surgeon: Nada Libman, MD;  Location: [facility] CATH LAB;  Service: Cardiovascular;  Laterality: Left;  renal   PERIPHERAL VASCULAR BALLOON ANGIOPLASTY  05/26/2017   Procedure: PERIPHERAL VASCULAR BALLOON ANGIOPLASTY;  Surgeon: Nada Libman, MD;  Location: [facility] INVAS[facility] CV LAB;  Service: Cardiovascular;;  Lt. Renal Angioplasty   POLYPECTOMY  05/08/2017   Procedure: POLYPECTOMY;  Surgeon: Malissa Hippo, MD;  Location: [facility];  Service: Endoscopy;;  colon    RENAL ANGIOGRAM N/A 06/15/2012   Procedure: RENAL ANGIOGRAM;  Surgeon: Nada Libman, MD;  Location: [facility] CATH LAB;  Service: Cardiovascular;  Laterality: N/A;   RENAL ANGIOGRAPHY  05/26/2017   Procedure: RENAL ANGIOGRAPHY;  Surgeon: Nada Libman, MD;  Location: [facility] INVASIVE CV LAB;  Service: Cardiovascular;;   SPINE SURGERY  1994   Ruptured disc   TEMPORARY PACEMAKER N/A 11/19/2022   Procedure: TEMPORARY PACEMAKER;  Surgeon: Iran Ouch, MD;  Location: [facility] INV[facility] CV LAB;  Service: Cardiovascular;  Laterality: N/A;   TONSILECTOMY, ADENOIDECTOMY, BILATERAL  MYRINGOTOMY AND TUBES  1969   TOTAL KNEE ARTHROPLASTY     Left   TUBAL LIGATION     Family History Family History  Problem Relation Age of Onset   Other Mother        Cerebral hemorrage   Heart disease  Mother    Heart attack Mother    Hyperlipidemia Mother    Heart disease Father    Heart attack Father    Hyperlipidemia Father    Heart disease Brother        Heart Disease before age 16   Diabetes Brother    Hypertension Brother    Heart attack Brother    Cancer Sister    Arthritis Sister    Heart disease Sister    Heart disease Daughter 31       Heart Disease before age 50   Heart attack Daughter    Hypertension Daughter    Cancer Sister    Arthritis Sister    Diabetes Brother        Varicose  Veins   Peripheral vascular disease Brother    Diabetes Brother     Social History Social History   Tobacco Use   Smoking status: Former    Current packs/day: 0.00    Types: Cigarettes    Start date: 02/18/1977    Quit date: 02/18/2022    Years since quitting: 1.3   Smokeless tobacco: Never  Vaping Use   Vaping status: Never Used  Substance Use Topics   Alcohol use: No    Alcohol/week: 0.0 standard drinks of alcohol   Drug use: No   Allergies Celecoxib, Citalopram, Morphine and codeine, Ciprofloxacin hcl, Dicyclomine, Lamotrigine, Metoclopramide, Milnacipran hcl, Morphine, Omeprazole, Pregabalin, Serotonin reuptake inhibitors (ssris), Simvastatin, Tizanidine, Trazodone and nefazodone, Venlafaxine, Codeine, Nefazodone, Tape, and Sertraline hcl  Review of Systems Review of Systems  Physical Exam Vital Signs  I have reviewed the triage vital signs BP (!) 127/52 (BP Location: Left Arm)   Pulse (!) 56   Temp 97.6 F (36.4 C) (Oral)   Resp 15   Ht 4\' 11"  (1.499 m)   Wt 63.5 kg   SpO2 100%   BMI 28.28 kg/m   Physical Exam Vitals reviewed.  Constitutional:      Appearance: Normal appearance.  HENT:     Head: Normocephalic.     Mouth/Throat:     Mouth: Mucous  membranes are moist.  Eyes:     Pupils: Pupils are equal, round, and reactive to light.  Skin:    General: Skin is warm and dry.  Neurological:     General: No focal deficit present.     Mental Status: She is alert and oriented to person, place, and time.  Psychiatric:        Mood and Affect: Mood normal.     ED Results and Treatments Labs (all labs ordered are listed, but only abnormal results are displayed) Labs Reviewed  COMPREHENSIVE METABOLIC PANEL  ETHANOL  RAPID URINE DRUG SCREEN, HOSP PERFORMED  CBC WITH DIFFERENTIAL/PLATELET  URINALYSIS, ROUTINE W REFLEX MICROSCOPIC                                                                                                                          Radiology No results  found.  Pertinent labs & imaging results that were available during my care of the patient were reviewed by me and considered in my medical decision making (see MDM for details).  Medications Ordered in ED Medications - No data to display                                                                                                                                   Procedures Procedures  (including critical care time)  Medical Decision Making / ED Course   This patient presents to the ED for concern of paranoia, reportedly saying that she wanted to get a gun and shoot people, this involves an extensive number of treatment options, and is a complaint that carries with it a high risk of complications and morbidity.  The differential diagnosis includes psychosis, dementia, paranoia, metabolic abnormalities, cystitis.  MDM: Patient was completely appropriate for me.  She denied her daughters claims, is alert and oriented x 3.  However with the report that the patient wanted to get a gun and shoot people, this is a claim that they must take seriously.  To that end, will attempt to medically clear the patient and refer her to TTS.  Patient with reassuring vital signs,  normal physical exam.  Patient medically cleared, pending TTS evaluation.   Additional history obtained: -Additional history obtained from daughter -External records from outside source obtained and reviewed including: Chart review including previous notes, labs, imaging, consultation notes   Lab Tests: -I ordered, reviewed, and interpreted labs.   The pertinent results include:   Labs Reviewed  COMPREHENSIVE METABOLIC PANEL  ETHANOL  RAPID URINE DRUG SCREEN, HOSP PERFORMED  CBC WITH DIFFERENTIAL/PLATELET  URINALYSIS, ROUTINE W REFLEX MICROSCOPIC      EKG my independent review the patient's EKG shows no ST segment depressions or elevations, no T wave inversions, no evidence of acute ischemia.  EKG Interpretation Date/Time:    Ventricular Rate:    PR Interval:    QRS Duration:    QT Interval:    QTC Calculation:   R Axis:      Text Interpretation:           Imaging Studies ordered: I ordered imaging studies including  I independently visualized and interpreted imaging. I agree with the radiologist interpretation   Medicines ordered and prescription drug management: No orders of the defined types were placed in this encounter.   -I have reviewed the patients home medicines and have made adjustments as needed  Cardiac Monitoring: The patient was maintained on a cardiac monitor.  I personally viewed and interpreted the cardiac monitored which showed an underlying rhythm of:   Social Determinants of Health:  Factors impacting patients care include: Housing insecurity   Reevaluation: After the interventions noted above, I reevaluated the patient and found that they have :improved  Co morbidities that complicate the patient evaluation  Past Medical History:  Diagnosis Date  Abnormal chest CT    Anxiety    Arthritis    Rhumatoid and Osteoarthritis   Carotid artery disease (HCC)    Dr. Myra Gianotti, s/p left CEA, severe R ICA stenosis   Coronary  atherosclerosis of native coronary artery    DES RCA/circumflex 4/10, LVEF 65 -> repeat cath 11/2021 with multivessel CAD including LM stenosis, initial med rx   Depression    Essential hypertension    Fibromyalgia    GERD (gastroesophageal reflux disease)    Hiatal hernia    Hyperlipidemia    Kidney cysts    NSTEMI (non-ST elevated myocardial infarction) (HCC)    4/10   Renal artery stenosis (HCC)    Left renal artery stent 12/13   Restless leg syndrome    Spondylosis without myelopathy    Tobacco abuse       Dispostion: TTS referral.     Final Clinical Impression(s) / ED Diagnoses Final diagnoses:  None     @PCDICTATION @    Anders Simmonds T, DO 06/09/23 2258

## 2023-06-10 ENCOUNTER — Encounter (HOSPITAL_COMMUNITY): Payer: Self-pay | Admitting: Psychiatry

## 2023-06-10 DIAGNOSIS — F410 Panic disorder [episodic paroxysmal anxiety] without agoraphobia: Secondary | ICD-10-CM | POA: Diagnosis not present

## 2023-06-10 DIAGNOSIS — Z658 Other specified problems related to psychosocial circumstances: Secondary | ICD-10-CM

## 2023-06-10 DIAGNOSIS — F411 Generalized anxiety disorder: Secondary | ICD-10-CM | POA: Diagnosis present

## 2023-06-10 MED ORDER — ACETAMINOPHEN 500 MG PO TABS
1000.0000 mg | ORAL_TABLET | Freq: Once | ORAL | Status: AC
  Administered 2023-06-10: 1000 mg via ORAL
  Filled 2023-06-10: qty 2

## 2023-06-10 MED ORDER — ACETAMINOPHEN 325 MG PO TABS: 650.0000 mg | ORAL_TABLET | Freq: Four times a day (QID) | ORAL | Status: DC | PRN

## 2023-06-10 MED ORDER — IBUPROFEN 400 MG PO TABS: 400.0000 mg | ORAL_TABLET | Freq: Four times a day (QID) | ORAL | Status: DC | PRN

## 2023-06-10 NOTE — ED Notes (Addendum)
Ask pt before releasing information

## 2023-06-10 NOTE — Consult Note (Signed)
[name]  Patient Name: [name] MRN: [id number] DOB: 29-Dec-1950 DATE OF Consult: 06/10/2023  PRIMARY PSYCHIATRIC DIAGNOSES  1.  Anxiety Disorder 2.  High Expressed Emotion Level Within Family 3.  Rule out Benzodiazepine Use Disorder  RECOMMENDATIONS  Recommendations: Medication recommendations: continue with previously prescribed outpatient sertraline 50mg  po daily, duloxetine DR 20mg  po daily; would recommend taper to discontinue benzodiazepines given family suspects abuse of medication and benzodiazepines known to impair cognition, mobility and driving skills in elderly; would consider alternative buspirone in not previously trial Non-Medication/therapeutic recommendations: SW intervention for potential placement ALF Is inpatient psychiatric hospitalization recommended for this patient? No (Explain why): patient is not imminent immediate harm to self/others; has been in ER for 7.5 hrs, has not demonstrated paranoia, psychosis, or aggression/violent behavior, no SI/HI Statements, does not meet IVC criteria; however if patient would be agreeable inpatient psychiatric hospitalization may be beneficial on voluntary basis for further monitoring of family reported paranoia and labile mood as continue to rule out underlying causative reasons for change in behavior over the past year Is another care setting recommended for this patient? (examples may include Crisis Stabilization Unit, Residential/Recovery Treatment, ALF/SNF, Memory Care Unit)  No (Explain why): patient would benefit from ALF placement From a psychiatric perspective, is this patient appropriate for discharge to an outpatient setting/resource or other less restrictive environment for continued care?  No (Explain why): recommend outpatient specialty psychiatry services for further evaluation and medication evaluation and ongoing treatment Communication: Treatment team members (and family members if applicable)  who were involved in treatment/care discussions and planning, and with whom we spoke or engaged with via secure text/chat, include the following: RN [name], Dr. Erin [name], and Patient Son [name].  Inpt psych admission recommended:    [x] YES       [x] NO   If yes:       []   Pt meets involuntary commitment criteria if not voluntary       [x]    Pt does not meet involuntary commitment criteria and must be         voluntary. If patient is not voluntary, then discharge is recommended.   Follow-Up Telepsychiatry C/L services:            []  We will continue to follow this patient with you.             [x]  Will sign off for now. Please re-consult our service as necessary.    Thank you for involving [place] in the care of this patient. If you have any additional questions or concerns, please call [phone] and ask for me or the provider on-call.  TELEPSYCHIATRY ATTESTATION & CONSENT  As the provider for this telehealth consult, I attest that I verified the patient's identity using two separate identifiers, introduced myself to the patient, provided my credentials, disclosed my location, and performed this encounter via a HIPAA-compliant, real-time, face-to-face, two-way, interactive audio and video platform and with the full consent and agreement of the patient (or guardian as applicable.)  Patient physical location: [facility] Telehealth provider physical location: home office in state of [place]  Video start time: 02:31am(Central Time) Video end time: 03:06 (Central Time)  IDENTIFYING DATA  [name] is a 72 y.o. year-old female for whom a psychiatric consultation has been ordered by the primary provider. The patient was identified using two separate identifiers.  CHIEF COMPLAINT/REASON FOR CONSULT  "My daughter just wants to put me in a nursing home, I was just sitting  in my room and she told me to get dressed, I asked where we were going and next thing I know police walked in to say we were  coming here"  HISTORY OF PRESENT ILLNESS (HPI)  The patient presents to emergency department transport by [facility] after daughter filed an IVP citing seeing and [name] things not there and fear she may become violent.  Unable to reach daughter by phone after 3 attempts.  Spoke with son via telephone, he resides in [place].   Patient is prescribed sertraline 50mg  daily for past 2 years and alprazolam 0.5mg  po three times daily for past 1 year , duloxetine 20mg  po daily for past 2 yrs by PCP. She denied taking more alprazolam than prescribed; however family suspect she may be taking more than prescribed.  Son reports pain management will no longer give her pain medication and while he is not sure why feels may be related to her taking more than prescribed.   Patient lives with her daughter for past month due to ongoing altercations with neighbor to point she was evicted and has charges of threatening with a deadly weapon, patient denied this happening, son is unsure what happened but both agree patient has not access to guns; son stated "don't know what the deadly weapon might have been the police didn't put in the report".  Patient was able to inform me she had a court date but it was rescheduled due to her upcoming scheduled surgery, son confirmed this to be accurate.  Patient reports she did not get along with her neighbor as "they were smoking crack and I was throwing up, door doesn't close all the way";  she denied threatening the neighbor "I just got run out of there because I was standing up for myself, I couldn't sleep as I was up all night because of them".  "I'm not that type of person, I'm not going to hell for murdering myself or someone else, I got saved, I'm a [place]".  Patient states she feels her daughter is trying to move her into a nursing home.  She never has liked me, she don't want me there and I'm not going to a nursing home"  Patient reports she wants to look for another  independent apartment, denied getting lonely living alone, "I got my music, I read my bible, I like to cook, work puzzle books" Patient states she is able to clean and cook, son reports she still has her car.  Son reports they were raised by aunt/uncle that mom was in and out of their life.  Stated daughter looks as mom as "birth mother only".  Son reports his sister has been telling him that his mom is paranoid, threatening to lock them under the house, stating they they are stealing from her, reports brother in law locks the bedroom door at night.  However, patient has not exhibited any aggressive or violent behaviors in the home.  Son stated when he spoke to his sister that she informed him their mother cannot return to their home.  He admits to feeling upset that sister filed the paperwork on mom "right before Thanksgiving".  He stated he cannot bring her to [facility] right now due to the legal court dates and upcoming surgery.  Son talks about patient's cardiac status and he wonders "if she is getting good oxygen maybe that is what is wrong".  Son stated patient had work up for dementia and was told it was ruled out. "It  really makes me wonder if she can hold it together there what is going on that she can't at home"  Today, client denied symptoms of depression no anergia, anhedonia, amotivation, no anxiety, denied frequent worry, no restlessness, no reported panic symptoms, no reported obsessive/compulsive behaviors. Client denies active SI/HI ideations, plans or intent. There is no evidence of psychosis or delusional thinking.  Client denied past episodes of hypomania, hyperactivity, erratic/excessive spending, involvement in dangerous activities, self-inflated ego, grandiosity, or promiscuity.  sleeping 8-10 hrs/24hrs, appetite good, concentration fair. has somatic concerns with pain and arthritis Client denied any current binging/purging behaviors, denied withholding food from self or engaging in anorexic  behaviors. No self-mutilation behaviors. Reviewed active outpatient medication list/reviewed labs.   Nursing staff reports patient has been with them for 7.5 hrs, reports they do have to provide repeat reminders to some questions; however, they have not seen patient responding to internal stimuli, nor has the patient exhibited aggressive or harmful behaviors, denied observing any psychosis. Patient has made no threatening statements.   PAST PSYCHIATRIC HISTORY  Entered mental health system when her husband was in [place] for depression.  Previous Psychiatric Hospitalizations: once in [place] couple years ago;  reports when begin tapered from morphine and she became depressed Previous Detox/Residential treatments: Outpt treatment:  from PCP Previous psychotropic medication trials: diazepam (took off after her heart attack), nefazodone, trazodone, venlafaxine, pregabalin, lamotrigine, citalopram Previous mental health diagnosis per client/MEDICAL RECORD NUMBERanxiety  Suicide attempts/self-injurious behaviors:  history years ago of overdose of aspirin, was charcoaled when she separated from husband "but I got saved since then"   PAST MEDICAL HISTORY  Past Medical History:  Diagnosis Date   Abnormal chest CT    Anxiety    Arthritis    Rhumatoid and Osteoarthritis   Carotid artery disease (HCC)    Dr. Myra Gianotti, s/p left CEA, severe R ICA stenosis   Coronary atherosclerosis of native coronary artery    DES RCA/circumflex 4/10, LVEF 65 -> repeat cath 11/2021 with multivessel CAD including LM stenosis, initial med rx   Depression    Essential hypertension    Fibromyalgia    GERD (gastroesophageal reflux disease)    Hiatal hernia    Hyperlipidemia    Kidney cysts    NSTEMI (non-ST elevated myocardial infarction) (HCC)    4/10   Renal artery stenosis (HCC)    Left renal artery stent 12/13   Restless leg syndrome    Spondylosis without myelopathy    Tobacco abuse      HOME MEDICATIONS   Facility Ordered Medications  Medication   ALPRAZolam (XANAX) tablet 0.5 mg   amLODipine (NORVASC) tablet 10 mg   clopidogrel (PLAVIX) tablet 75 mg   diclofenac Sodium (VOLTAREN) 1 % topical gel 2 g   diphenhydrAMINE (BENADRYL) capsule 25 mg   DULoxetine (CYMBALTA) DR capsule 20 mg   isosorbide mononitrate (IMDUR) 24 hr tablet 30 mg   metoprolol succinate (TOPROL-XL) 24 hr tablet 50 mg   ondansetron (ZOFRAN) tablet 4 mg   pantoprazole (PROTONIX) EC tablet 40 mg   rosuvastatin (CRESTOR) tablet 40 mg   sertraline (ZOLOFT) tablet 50 mg   cyanocobalamin (VITAMIN B12) tablet 1,000 mcg   [START ON 06/15/2023] Vitamin D (Ergocalciferol) (DRISDOL) 1.25 MG (50000 UNIT) capsule 50,000 Units   levothyroxine (SYNTHROID) tablet 25 mcg   [COMPLETED] acetaminophen (TYLENOL) tablet 1,000 mg   acetaminophen (TYLENOL) tablet 650 mg   ibuprofen (ADVIL) tablet 400 mg   PTA Medications  Medication Sig   Vitamin  D, Ergocalciferol, (DRISDOL) 50000 UNITS CAPS Take 50,000 Units by mouth every Monday.    levothyroxine (SYNTHROID) 25 MCG tablet Take 25 mcg by mouth daily before breakfast.   vitamin B-12 (CYANOCOBALAMIN) 1000 MCG tablet Take 2 tablets (2,000 mcg total) by mouth daily. (Patient taking differently: Take 1,000 mcg by mouth daily.)   sertraline (ZOLOFT) 50 MG tablet Take 50 mg by mouth daily.   DULoxetine (CYMBALTA) 20 MG capsule Take 20 mg by mouth at bedtime.   aspirin 81 MG EC tablet Take 81 mg by mouth daily.   albuterol (VENTOLIN HFA) 108 (90 Base) MCG/ACT inhaler 1 puff 4 (four) times daily as needed for wheezing or shortness of breath.   diclofenac Sodium (VOLTAREN) 1 % GEL Apply 2 g topically 3 (three) times daily.   ondansetron (ZOFRAN) 4 MG tablet Take 4 mg by mouth 3 (three) times daily as needed for nausea.   ALPRAZolam (XANAX) 0.5 MG tablet Take 0.5 mg by mouth 3 (three) times daily as needed.   pantoprazole (PROTONIX) 40 MG tablet Take 40 mg by mouth daily.   isosorbide mononitrate  (IMDUR) 60 MG 24 hr tablet Take 1 tablet (60 mg total) by mouth daily. (Patient taking differently: Take 30 mg by mouth daily.)   metoprolol succinate (TOPROL-XL) 100 MG 24 hr tablet Take 1 tablet (100 mg total) by mouth daily. Take with or immediately following a meal. (Patient taking differently: Take 50 mg by mouth daily. Take with or immediately following a meal.)   amLODipine (NORVASC) 10 MG tablet Take 1 tablet (10 mg total) by mouth daily.   clopidogrel (PLAVIX) 75 MG tablet Take 1 tablet (75 mg total) by mouth daily.   rosuvastatin (CRESTOR) 40 MG tablet TAKE 1 TABLET BY MOUTH AT BEDTIME   nitroGLYCERIN (NITROSTAT) 0.4 MG SL tablet Place 1 tablet (0.4 mg total) under the tongue every 5 (five) minutes x 3 doses as needed for chest pain (if no relief after 2nd dose, proceed to ED or call 911).   EPINEPHrine (EPIPEN JR) 0.15 MG/0.3ML injection Inject 0.15 mg into the muscle as needed for anaphylaxis. (Patient not taking: Reported on 06/09/2023)    ALLERGIES  Allergies  Allergen Reactions   Celecoxib Itching and Other (See Comments)    Other Reaction(s): Not available   Citalopram Other (See Comments) and Palpitations    Heart rate increased   Morphine And Codeine Other (See Comments) and Itching    Patient states she is not allergic to morphine; states had morphine with no reaction   Ciprofloxacin Hcl Other (See Comments)    Mya have made her sick or bad diarrhea   Dicyclomine Diarrhea and Other (See Comments)    Made diarrhea worse   Lamotrigine Other (See Comments) and Diarrhea    Unknown   Metoclopramide Other (See Comments)    Dizziness   Milnacipran Hcl Other (See Comments)    Unknown   Omeprazole Other (See Comments)   Pregabalin Other (See Comments)   Serotonin Reuptake Inhibitors (Ssris) Other (See Comments)   Simvastatin Other (See Comments)    Increased back and muscle pain   Tizanidine Other (See Comments)    unknown   Trazodone And Nefazodone Other (See Comments)     hallucinations   Venlafaxine Other (See Comments)    Affected eyes, blurred vision   Codeine Itching   Nefazodone Other (See Comments)   Tape Other (See Comments)    Skin gets red and skin pulls off, paper tape only  Sertraline Hcl Other (See Comments)    Stomach upset. 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     had to quit for bulging disk; obtains disability Education: 11th grade, obtained GED has current legal issues with upcoming court date   Social Drivers of Health Y/N   Physicist, medical Strain: N  Food Insecurity: N  Transportation Needs: N  Physical Activity: Y  Stress: Y  Social Connections: Y     FAMILY HISTORY  Family Psychiatric History (if known):  denied psychiatric illnesses, father "would get into the corn liquor";  brother committed suicide "he was in bad car accident, did something to his head, told mom pain was too bad"--patient was age 74 or 84  MENTAL STATUS EXAM (MSE)  Appearance:  [] Appropriately dressed for the context and circumstances      []  Well-groomed       []  Casually dressed  [x] Hospital gown       []  Unkempt   Attitude:  [x] Cooperative      []  Guarded       []  Aggressive      []  Demanding       []  Suspicious       []  Withdrawn  [] Attention-seeking      []  Sullen   Behavior:   [x] Normal motor activity and eye contact   []  Psychomotor slowing       [] Poor eye contact         [] Tearful []  Restless       []  Fidgeting        [] Psychomotor agitation   Impulse Control  [x] Good     []  Fair     []  Poor   Speech: [x] Within normal limits for the context and circumstances  []  Slow       []  Soft       [] Monotone      [] Little spontaneous speech    []  Incoherent     []  Slurred    [] Excessive       []  Rapid        []        []  Pressured       []  Dramatic      []  Accent noted   Mood:  [x] Euthymic      []  Dysthymic     []  Depressed        []  Anxious        [] Angry/Irritable   []  Expansive     []  Euphoric/Elated       Affect:  [x] Congruent and appropriate to content of speech and circumstances  [] Full range    []  Constricted    []  Flat    []  Blunted    []  Exaggerated     [] Labile     [] Inappropriate:   Thought process:  [x] Linear, logical and goal directed    []  Disorganized    []  Circumstantial    [] Circumferential    []  Flight of ideas  [] Tangential   []  Thought blocking   []  Loose associations   Thought content:  [x] Denies hallucinations, delusions, or paranoia    []  Hallucinations [] Delusions      []   Paranoia        []  Ideas of reference      []  Obsessions        [] ELOC   Suicidality and Homicidality:  [x] Denies any active or passive suicidal or homicidal ideation   []  Passive suicidal ideation [] Active suicidal ideation     []  Passive homicidal ideation      []  Active homicidal ideation      []  As per HPI   Insight:   []  Good      [x]  Fair      []  Poor   Judgement:    []  Good      [x]  Fair      []  Poor   Memory:    []  Good       [x]  Fair      []  Poor   Attention/Concentration:    [x]  Good      []  Fair      []  Poor   Orientation:      [x]  Oriented to person, place, and time        []  Disoriented   Language:   [x] Normal Fluency      []  Fair Fluency      []  Poor Fluency   Fund of knowledge:    [] Good       [] Fair         []  Poor    VITALS  Blood pressure (!) 127/52, pulse (!) 56, temperature 97.6 F (36.4 C), temperature source Oral, resp. rate 15, height 4\' 11"  (1.499 m), weight 63.5 kg, SpO2 100%.  LABS  Admission on 06/09/2023  Component Date Value Ref Range Status   Sodium 06/09/2023 139  135 - 145 mmol/L Final   Potassium 06/09/2023 3.4 (L)  3.5 - 5.1 mmol/L Final   Chloride 06/09/2023 107  98 - 111 mmol/L Final   CO2 06/09/2023 22  22 - 32 mmol/L Final   Glucose, Bld 06/09/2023 163 (H)   70 - 99 mg/dL Final   BUN 40/98/1191 24 (H)  8 - 23 mg/dL Final   Creatinine, Ser 06/09/2023 1.14 (H)  0.44 - 1.00 mg/dL Final   Calcium 47/82/9562 9.0  8.9 - 10.3 mg/dL Final   Total Protein 13/02/6577 7.4  6.5 - 8.1 g/dL Final   Albumin 46/96/2952 4.1  3.5 - 5.0 g/dL Final   AST 84/13/2440 44 (H)  15 - 41 U/L Final   ALT 06/09/2023 21  0 - 44 U/L Final   Alkaline Phosphatase 06/09/2023 64  38 - 126 U/L Final   Total Bilirubin 06/09/2023 0.7  <1.2 mg/dL Final   GFR, Estimated 06/09/2023 NOT CALCULATED  >60 mL/min Final   GFR calc Af Amer 06/09/2023 NOT CALCULATED  >60 mL/min Final   Anion gap 06/09/2023 10.0  5 - 15 Final   Performed at [facility], [address], [place], [place] [address]   Alcohol, Ethyl (B) 06/09/2023 <10  <10 mg/dL Final   Comment: ([name]) Lowest detectable limit for serum alcohol is 10 mg/dL.  For medical purposes only. Performed at [facility], [address], [place], [place] [address]    WBC 06/09/2023 6.8  4.0 - 10.5 K/uL Final   RBC 06/09/2023 4.02  3.87 - 5.11 MIL/uL Final   Hemoglobin 06/09/2023 12.8  12.0 - 15.0 g/dL Final   HCT 40/34/7425 38.4  36.0 - 46.0 % Final   MCV 06/09/2023 95.5  80.0 - 100.0 fL Final   MCH 06/09/2023 31.8  26.0 - 34.0  pg Final   MCHC 06/09/2023 33.3  30.0 - 36.0 g/dL Final   RDW 46/96/2952 12.9  11.5 - 15.5 % Final   Platelets 06/09/2023 216  150 - 400 K/uL Final   nRBC 06/09/2023 0.0  0.0 - 0.2 % Final   Neutrophils Relative % 06/09/2023 62  % Final   Neutro Abs 06/09/2023 4.2  1.7 - 7.7 K/uL Final   Lymphocytes Relative 06/09/2023 26  % Final   Lymphs Abs 06/09/2023 1.8  0.7 - 4.0 K/uL Final   Monocytes Relative 06/09/2023 9  % Final   Monocytes Absolute 06/09/2023 0.6  0.1 - 1.0 K/uL Final   Eosinophils Relative 06/09/2023 2  % Final   Eosinophils Absolute 06/09/2023 0.1  0.0 - 0.5 K/uL Final   Basophils Relative 06/09/2023 1  % Final   Basophils Absolute 06/09/2023 0.0  0.0 - 0.1 K/uL Final   Immature Granulocytes  06/09/2023 0  % Final   Abs Immature Granulocytes 06/09/2023 0.02  0.00 - 0.07 K/uL Final   Performed at [facility], [address] [place], [place] [address]    PSYCHIATRIC REVIEW OF SYSTEMS (ROS)  ROS: Notable for the following relevant positive findings: ROS  Additional findings:      Musculoskeletal: No abnormal movements observed      Gait & Station: Laying/Sitting      Pain Screening: Present - mild to moderate      Nutrition & Dental Concerns: none noted  RISK FORMULATION/ASSESSMENT  Is the patient experiencing any suicidal or homicidal ideations: No       Explain if yes:  Protective factors considered for safety management:  Absence of psychosis Access to adequate health care Advice& help seeking Resourcefulness/Survival skills Children Sense of responsibility Spirituality Positive therapeutic relationship Future oriented Suicide Inquiry:  Denies suicidal ideations, intentions, or plans.  Denies  recent self-harm behavior. Talks futuristically.  Risk factors/concerns considered for safety management:  Prior attempt Family history of suicide Substance abuse/dependence Age over 72 Unmarried  Is there a Astronomer plan with the patient and treatment team to minimize risk factors and promote protective factors: Yes           Explain: currently on observation status for IVC Is crisis care placement or psychiatric hospitalization recommended: No     Based on my current evaluation and risk assessment, patient is determined at this time to be at:  Low risk  Global Suicide Risk Assessment: The client is found to be at Low risk of suicide or violence; however, risk lethality increased under context of drugs/alcohol. Encouraged to only take her alprazolam as prescribed  *RISK ASSESSMENT Risk assessment is a dynamic process; it is possible that this patient's condition, and risk level, may change. This should be re-evaluated and managed over time as appropriate.  Please re-consult psychiatric consult services if additional assistance is needed in terms of risk assessment and management. If your team decides to discharge this patient, please advise the patient how to best access emergency psychiatric services, or to call 911, if their condition worsens or they feel unsafe in any way.  Total time spent in this encounter was 70 minutes with greater than 50% of time spent in counseling and coordination of care.     Dr. Olivia Mackie. Christell Constant, PhD, MSN, APRN, PMHNP-BC, MCJ   Tera Helper, NP Telepsychiatry Consult Services

## 2023-06-10 NOTE — ED Notes (Signed)
Pt on call with TTS

## 2023-06-10 NOTE — ED Provider Notes (Signed)
Patient transferred to me pending TTS consultation.   Requesting night meds. Ordered.   Requesting pain meds. Tylenol ordered.   TTS evaluated and no e/o psychosis. On my evaluation patient is calm, collected and not responding to internal stimuli. Per nursing, has been same all night. No indication to continue IVC at this time. Rescinded.  Patient prefers to go back to daughter's house but if not has the resources to stay at a motel and feels safe doing so. Doesn't want to go to ALF or voluntary psychiatric facility at this point. She will work on getting a discharge plan together.    Senya Hinzman, Barbara Cower, MD 06/10/23 [phone]

## 2023-06-10 NOTE — ED Notes (Signed)
Pt family informed about discharge. Daughter informed us that her husband would be picking the pt up in roughly an hours time.

## 2023-06-10 NOTE — BH Assessment (Signed)
TTS Consult will be completed by [facility]. Provider and time of assessment will be communicated in established secure chat. Thanks

## 2023-06-10 NOTE — ED Notes (Signed)
Pt changed into purple scrubs and belongings placed in locker with sticker on items and locker.

## 2023-06-19 DIAGNOSIS — I10 Essential (primary) hypertension: Secondary | ICD-10-CM | POA: Diagnosis not present

## 2023-06-19 DIAGNOSIS — J449 Chronic obstructive pulmonary disease, unspecified: Secondary | ICD-10-CM | POA: Diagnosis not present

## 2023-06-19 DIAGNOSIS — Z299 Encounter for prophylactic measures, unspecified: Secondary | ICD-10-CM | POA: Diagnosis not present

## 2023-06-19 DIAGNOSIS — N1832 Chronic kidney disease, stage 3b: Secondary | ICD-10-CM | POA: Diagnosis not present

## 2023-06-19 DIAGNOSIS — I779 Disorder of arteries and arterioles, unspecified: Secondary | ICD-10-CM | POA: Diagnosis not present

## 2023-06-19 DIAGNOSIS — I25119 Atherosclerotic heart disease of native coronary artery with unspecified angina pectoris: Secondary | ICD-10-CM | POA: Diagnosis not present

## 2023-06-19 DIAGNOSIS — R52 Pain, unspecified: Secondary | ICD-10-CM | POA: Diagnosis not present

## 2023-06-23 ENCOUNTER — Telehealth: Payer: Self-pay

## 2023-06-23 DIAGNOSIS — Z1231 Encounter for screening mammogram for malignant neoplasm of breast: Secondary | ICD-10-CM | POA: Diagnosis not present

## 2023-06-23 NOTE — Telephone Encounter (Signed)
Returned pt's daughter's call regarding upcoming surgery. Unsure of what she was needing info on. Left VM for her to call us back if she still needs assistance.

## 2023-06-30 ENCOUNTER — Other Ambulatory Visit: Payer: Self-pay | Admitting: *Deleted

## 2023-06-30 DIAGNOSIS — I6523 Occlusion and stenosis of bilateral carotid arteries: Secondary | ICD-10-CM

## 2023-07-01 NOTE — Pre-Procedure Instructions (Signed)
Surgical Instructions   Your procedure is scheduled on Friday, December 20th. Report to [facility] Main Entrance "A" at 0530 A.M., then check in with the Admitting office. Any questions or running late day of surgery: call [phone]  Questions prior to your surgery date: call [phone], Monday-Friday, 8am-4pm. If you experience any cold or flu symptoms such as cough, fever, chills, shortness of breath, etc. between now and your scheduled surgery, please notify us at the above number.     Remember:  Do not eat or drink after midnight the night before your surgery     Take these medicines the morning of surgery with A SIP OF WATER  amLODipine (NORVASC)  aspirin  clopidogrel (PLAVIX)  isosorbide mononitrate (IMDUR)  levothyroxine (SYNTHROID)  metoprolol succinate (TOPROL-XL)  pantoprazole (PROTONIX)  sertraline (ZOLOFT)    May take these medicines IF NEEDED: albuterol (VENTOLIN HFA)- bring inhaler with you on day of surgery ALPRAZolam (XANAX)  diphenhydrAMINE (BENADRYL)  EPINEPHrine (EPIPEN JR)  nitroGLYCERIN (NITROSTAT)- If you have to take this medication prior to surgery, please call [phone] and report this to a nurse  [facility])    One week prior to surgery, STOP taking any Aspirin (unless otherwise instructed by your surgeon) Aleve, Naproxen, Ibuprofen, Motrin, Advil, Goody's, BC's, all herbal medications, fish oil, and non-prescription vitamins. This includes diclofenac Sodium (VOLTAREN) gel.                     Do NOT Smoke (Tobacco/Vaping) for 24 hours prior to your procedure.  If you use a CPAP at night, you may bring your mask/headgear for your overnight stay.   You will be asked to remove any contacts, glasses, piercing's, hearing aid's, dentures/partials prior to surgery. Please bring cases for these items if needed.    Patients discharged the day of surgery will not be allowed to drive home, and someone needs to stay with them for 24  hours.  SURGICAL WAITING ROOM VISITATION Patients may have no more than 2 support people in the waiting area - these visitors may rotate.   Pre-op nurse will coordinate an appropriate time for 1 ADULT support person, who may not rotate, to accompany patient in pre-op.  Children under the age of 69 must have an adult with them who is not the patient and must remain in the main waiting area with an adult.  If the patient needs to stay at the hospital during part of their recovery, the visitor guidelines for inpatient rooms apply.  Please refer to the [facility] website for the visitor guidelines for any additional information.   If you received a COVID test during your pre-op visit  it is requested that you wear a mask when out in public, stay away from anyone that may not be feeling well and notify your surgeon if you develop symptoms. If you have been in contact with anyone that has tested positive in the last 10 days please notify you surgeon.      Pre-operative CHG Bathing Instructions   You can play a key role in reducing the risk of infection after surgery. Your skin needs to be as free of germs as possible. You can reduce the number of germs on your skin by washing with CHG (chlorhexidine gluconate) soap before surgery. CHG is an antiseptic soap that kills germs and continues to kill germs even after washing.   DO NOT use if you have an allergy to chlorhexidine/CHG or antibacterial soaps. If your skin becomes  reddened or irritated, stop using the CHG and notify one of our RNs at [phone].              TAKE A SHOWER THE NIGHT BEFORE SURGERY AND THE DAY OF SURGERY    Please keep in mind the following:  DO NOT shave, including legs and underarms, 48 hours prior to surgery.   You may shave your face before/day of surgery.  Place clean sheets on your bed the night before surgery Use a clean washcloth (not used since being washed) for each shower. DO NOT sleep with pet's night  before surgery.  CHG Shower Instructions:  Wash your face and private area with normal soap. If you choose to wash your hair, wash first with your normal shampoo.  After you use shampoo/soap, rinse your hair and body thoroughly to remove shampoo/soap residue.  Turn the water OFF and apply half the bottle of CHG soap to a CLEAN washcloth.  Apply CHG soap ONLY FROM YOUR NECK DOWN TO YOUR TOES (washing for 3-5 minutes)  DO NOT use CHG soap on face, private areas, open wounds, or sores.  Pay special attention to the area where your surgery is being performed.  If you are having back surgery, having someone wash your back for you may be helpful. Wait 2 minutes after CHG soap is applied, then you may rinse off the CHG soap.  Pat dry with a clean towel  Put on clean pajamas    Additional instructions for the day of surgery: DO NOT APPLY any lotions, deodorants, cologne, or perfumes.   Do not wear jewelry or makeup Do not wear nail polish, gel polish, artificial nails, or any other type of covering on natural nails (fingers and toes) Do not bring valuables to the hospital. [facility] is not responsible for valuables/personal belongings. Put on clean/comfortable clothes.  Please brush your teeth.  Ask your nurse before applying any prescription medications to the skin.

## 2023-07-02 ENCOUNTER — Other Ambulatory Visit: Payer: Self-pay

## 2023-07-02 ENCOUNTER — Encounter (HOSPITAL_COMMUNITY)
Admission: RE | Admit: 2023-07-02 | Discharge: 2023-07-02 | Disposition: A | Discharge status: Home / Self Care | Payer: 59 | Source: Ambulatory Visit | Attending: Surgery | Admitting: Surgery

## 2023-07-02 ENCOUNTER — Encounter (HOSPITAL_COMMUNITY): Payer: Self-pay

## 2023-07-02 VITALS — BP 130/49 | HR 55 | Temp 98.2°F | Resp 17 | Ht 59.0 in | Wt 146.4 lb

## 2023-07-02 DIAGNOSIS — I252 Old myocardial infarction: Secondary | ICD-10-CM | POA: Diagnosis not present

## 2023-07-02 DIAGNOSIS — I2510 Atherosclerotic heart disease of native coronary artery without angina pectoris: Secondary | ICD-10-CM | POA: Insufficient documentation

## 2023-07-02 DIAGNOSIS — Z8249 Family history of ischemic heart disease and other diseases of the circulatory system: Secondary | ICD-10-CM | POA: Diagnosis not present

## 2023-07-02 DIAGNOSIS — R079 Chest pain, unspecified: Secondary | ICD-10-CM | POA: Insufficient documentation

## 2023-07-02 DIAGNOSIS — Z8261 Family history of arthritis: Secondary | ICD-10-CM | POA: Diagnosis not present

## 2023-07-02 DIAGNOSIS — R030 Elevated blood-pressure reading, without diagnosis of hypertension: Secondary | ICD-10-CM | POA: Insufficient documentation

## 2023-07-02 DIAGNOSIS — I701 Atherosclerosis of renal artery: Secondary | ICD-10-CM | POA: Insufficient documentation

## 2023-07-02 DIAGNOSIS — Z832 Family history of diseases of the blood and blood-forming organs and certain disorders involving the immune mechanism: Secondary | ICD-10-CM | POA: Diagnosis not present

## 2023-07-02 DIAGNOSIS — I959 Hypotension, unspecified: Secondary | ICD-10-CM | POA: Diagnosis not present

## 2023-07-02 DIAGNOSIS — D62 Acute posthemorrhagic anemia: Secondary | ICD-10-CM | POA: Diagnosis not present

## 2023-07-02 DIAGNOSIS — Z01812 Encounter for preprocedural laboratory examination: Secondary | ICD-10-CM | POA: Insufficient documentation

## 2023-07-02 DIAGNOSIS — Z7982 Long term (current) use of aspirin: Secondary | ICD-10-CM | POA: Diagnosis not present

## 2023-07-02 DIAGNOSIS — E785 Hyperlipidemia, unspecified: Secondary | ICD-10-CM | POA: Insufficient documentation

## 2023-07-02 DIAGNOSIS — I6521 Occlusion and stenosis of right carotid artery: Secondary | ICD-10-CM | POA: Diagnosis not present

## 2023-07-02 DIAGNOSIS — Z87891 Personal history of nicotine dependence: Secondary | ICD-10-CM | POA: Diagnosis not present

## 2023-07-02 DIAGNOSIS — I129 Hypertensive chronic kidney disease with stage 1 through stage 4 chronic kidney disease, or unspecified chronic kidney disease: Secondary | ICD-10-CM | POA: Insufficient documentation

## 2023-07-02 DIAGNOSIS — I6522 Occlusion and stenosis of left carotid artery: Secondary | ICD-10-CM | POA: Insufficient documentation

## 2023-07-02 DIAGNOSIS — Z01818 Encounter for other preprocedural examination: Secondary | ICD-10-CM

## 2023-07-02 DIAGNOSIS — I739 Peripheral vascular disease, unspecified: Secondary | ICD-10-CM | POA: Insufficient documentation

## 2023-07-02 DIAGNOSIS — I10 Essential (primary) hypertension: Secondary | ICD-10-CM | POA: Diagnosis not present

## 2023-07-02 DIAGNOSIS — N183 Chronic kidney disease, stage 3 unspecified: Secondary | ICD-10-CM | POA: Insufficient documentation

## 2023-07-02 DIAGNOSIS — I6523 Occlusion and stenosis of bilateral carotid arteries: Secondary | ICD-10-CM

## 2023-07-02 DIAGNOSIS — Z885 Allergy status to narcotic agent status: Secondary | ICD-10-CM | POA: Diagnosis not present

## 2023-07-02 DIAGNOSIS — I358 Other nonrheumatic aortic valve disorders: Secondary | ICD-10-CM | POA: Diagnosis not present

## 2023-07-02 DIAGNOSIS — G2581 Restless legs syndrome: Secondary | ICD-10-CM | POA: Diagnosis not present

## 2023-07-02 DIAGNOSIS — Z91048 Other nonmedicinal substance allergy status: Secondary | ICD-10-CM | POA: Diagnosis not present

## 2023-07-02 DIAGNOSIS — Z7902 Long term (current) use of antithrombotics/antiplatelets: Secondary | ICD-10-CM | POA: Diagnosis not present

## 2023-07-02 DIAGNOSIS — Z79899 Other long term (current) drug therapy: Secondary | ICD-10-CM | POA: Diagnosis not present

## 2023-07-02 DIAGNOSIS — Z888 Allergy status to other drugs, medicaments and biological substances status: Secondary | ICD-10-CM | POA: Diagnosis not present

## 2023-07-02 DIAGNOSIS — M797 Fibromyalgia: Secondary | ICD-10-CM | POA: Diagnosis not present

## 2023-07-02 DIAGNOSIS — Z833 Family history of diabetes mellitus: Secondary | ICD-10-CM | POA: Diagnosis not present

## 2023-07-02 HISTORY — DX: Sleep apnea, unspecified: G47.30

## 2023-07-02 LAB — URINALYSIS, ROUTINE W REFLEX MICROSCOPIC
Bacteria, UA: NONE SEEN
Bilirubin Urine: NEGATIVE
Glucose, UA: NEGATIVE mg/dL
Hgb urine dipstick: NEGATIVE
Ketones, ur: NEGATIVE mg/dL
Leukocytes,Ua: NEGATIVE
Nitrite: NEGATIVE
Protein, ur: 100 mg/dL — AB
Specific Gravity, Urine: 1.02 (ref 1.005–1.030)
pH: 5 (ref 5.0–8.0)

## 2023-07-02 LAB — CBC
HCT: 36.6 % (ref 36.0–46.0)
Hemoglobin: 11.7 g/dL — ABNORMAL LOW (ref 12.0–15.0)
MCH: 31.9 pg (ref 26.0–34.0)
MCHC: 32 g/dL (ref 30.0–36.0)
MCV: 99.7 fL (ref 80.0–100.0)
Platelets: 145 10*3/uL — ABNORMAL LOW (ref 150–400)
RBC: 3.67 MIL/uL — ABNORMAL LOW (ref 3.87–5.11)
RDW: 12.9 % (ref 11.5–15.5)
WBC: 6.7 10*3/uL (ref 4.0–10.5)
nRBC: 0 % (ref 0.0–0.2)

## 2023-07-02 LAB — COMPREHENSIVE METABOLIC PANEL
ALT: 12 U/L (ref 0–44)
AST: 17 U/L (ref 15–41)
Albumin: 3.7 g/dL (ref 3.5–5.0)
Alkaline Phosphatase: 55 U/L (ref 38–126)
Anion gap: 7 (ref 5–15)
BUN: 22 mg/dL (ref 8–23)
CO2: 25 mmol/L (ref 22–32)
Calcium: 9 mg/dL (ref 8.9–10.3)
Chloride: 107 mmol/L (ref 98–111)
Creatinine, Ser: 1.42 mg/dL — ABNORMAL HIGH (ref 0.44–1.00)
GFR, Estimated: 39 mL/min — ABNORMAL LOW (ref >60)
Glucose, Bld: 112 mg/dL — ABNORMAL HIGH (ref 70–99)
Potassium: 4 mmol/L (ref 3.5–5.1)
Sodium: 139 mmol/L (ref 135–145)
Total Bilirubin: 0.5 mg/dL (ref <1.2)
Total Protein: 6.7 g/dL (ref 6.5–8.1)

## 2023-07-02 LAB — APTT: aPTT: 29 s (ref 24–36)

## 2023-07-02 LAB — SURGICAL PCR SCREEN
MRSA, PCR: NEGATIVE
Staphylococcus aureus: NEGATIVE

## 2023-07-02 LAB — PROTIME-INR
INR: 1 (ref 0.8–1.2)
Prothrombin Time: 13.6 s (ref 11.4–15.2)

## 2023-07-02 NOTE — Progress Notes (Signed)
Anesthesia Chart Review:  72 year old female follows cardiology for history of PAD, HLD, HTN, renal artery stenosis s/p stenting, carotid stenosis s/p left CEA 2010, CAD s/p DES to ostial left main 11/19/2022.  Seen by Edd Fabian, NP 05/03/2023 for preop evaluation.  Per note, "Chart reviewed as part of pre-operative protocol coverage. Given past medical history and time since last visit, based on ACC/AHA guidelines, [name] would be at acceptable risk for the planned procedure without further cardiovascular testing. Her RCRI is moderate risk, 6.6% risk of major cardiac event.  She is able to complete greater than 4 METS of physical activity. Patient was advised that if she develops new symptoms prior to surgery to contact our office to arrange a follow-up appointment.  She verbalized understanding."  Per vascular surgery, she is to continue her dual antiplatelet therapy.  Other pertinent history includes GERD, hiatal hernia, rheumatoid arthritis, OSA not on CPAP, hypothyroidism, stage III CKD.  Preop labs reviewed, creatinine mildly elevated 1.42, mild anemia with hemoglobin 11.7, otherwise unremarkable.  EKG 06/09/2023: Bradycardia.  Rate 55.  Nonspecific ST abnormality.  Carotid duplex 04/06/2023: Summary:  Right Carotid: Velocities in the right ICA are consistent with a 80-99% stenosis. The ECA appears >50% stenosed.  Left Carotid: Velocities in the left ICA are consistent with a 1-39% stenosis. Hemodynamically significant plaque >50% visualized in the CCA.  Vertebrals: Bilateral vertebral arteries demonstrate antegrade flow.  Subclavians: Bilateral subclavian arteries were stenotic.   Cath and PCI 11/19/2022:   Mid Cx lesion is 20% stenosed.   Mid Cx to Dist Cx lesion is 70% stenosed.   Prox Cx lesion is 60% stenosed.   Ost LM to Mid LM lesion is 90% stenosed.   A drug-eluting stent was successfully placed using a STENT MEGATRON 3.5X8.   Post intervention, there is a 10% residual  stenosis.   Successful IVUS guided high risk rotational atherectomy and drug-eluting stent placement to the ostial left main coronary artery extending into the distal segment just before the bifurcation.  Difficult procedure overall due to heavy eccentric calcifications and short left main. The patient had chest pain and elevated blood pressure with balloon inflations and thus she was started on nitroglycerin drip with resolution of symptoms.   Recommendations: Dual antiplatelet therapy for at least 1 year. Aggressive treatment of risk factors.  Echocardiogram 11/17/2022: 1. Left ventricular ejection fraction, by estimation, is 55 to 60%. The  left ventricle has normal function. The left ventricle has no regional  wall motion abnormalities. There is mild concentric left ventricular  hypertrophy. Left ventricular diastolic  parameters are consistent with Grade I diastolic dysfunction (impaired  relaxation).   2. Right ventricular systolic function is normal. The right ventricular  size is normal. There is normal pulmonary artery systolic pressure. The  estimated right ventricular systolic pressure is 19.2 mmHg.   3. The mitral valve is normal in structure. Trivial mitral valve  regurgitation. No evidence of mitral stenosis.   4. The aortic valve is tricuspid. There is mild calcification of the  aortic valve. Aortic valve regurgitation is not visualized. No aortic  stenosis is present.   5. The inferior vena cava is normal in size with greater than 50%  respiratory variability, suggesting right atrial pressure of 3 mmHg.    Zannie Cove [facility] Short Stay Center/Anesthesiology Phone [phone] 07/02/2023 11:21 AM

## 2023-07-02 NOTE — Progress Notes (Signed)
PCP - Dr. Doreen Beam Cardiologist - Dr. Nona Dell  PPM/ICD - Temp pacemaker 11/19/2022 Device Orders - na Rep Notified - na  Chest x-ray - na EKG - 06/09/2023 Stress Test - 09/03/2021 ECHO - 11/17/2022 Cardiac Cath - 11/19/2022  Sleep Study - diagnosed with sleep apnea CPAP - does not wear her CPAP  Non-diabetic  Blood Thinner Instructions:Plavix, continue Aspirin Instructions:continue  ERAS Protcol -NPO  COVID TEST- na  Anesthesia review: Yes.  CAD, HTN, MI, PVD  Patient denies shortness of breath, fever, cough and chest pain at PAT appointment   All instructions explained to the patient, with a verbal understanding of the material. Patient agrees to go over the instructions while at home for a better understanding. Patient also instructed to self quarantine after being tested for COVID-19. The opportunity to ask questions was provided.

## 2023-07-02 NOTE — Anesthesia Preprocedure Evaluation (Addendum)
Anesthesia Evaluation  Patient identified by MRN, date of birth, ID band Patient awake    Reviewed: Allergy & Precautions, NPO status , Patient's Chart, lab work & pertinent test results  Airway Mallampati: II  TM Distance: >3 FB Neck ROM: Full    Dental  (+) Edentulous Upper, Edentulous Lower   Pulmonary sleep apnea , Patient abstained from smoking., former smoker   breath sounds clear to auscultation       Cardiovascular hypertension, Pt. on medications + CAD, + Past MI, + Cardiac Stents and + Peripheral Vascular Disease   Rhythm:Regular Rate:Normal     Neuro/Psych  Neuromuscular disease    GI/Hepatic Neg liver ROS, hiatal hernia,GERD  ,,  Endo/Other  diabetesHypothyroidism    Renal/GU Renal disease     Musculoskeletal  (+) Arthritis ,  Fibromyalgia -  Abdominal   Peds  Hematology  (+) Blood dyscrasia, anemia   Anesthesia Other Findings   Reproductive/Obstetrics                              Lab Results  Component Value Date   WBC 6.7 07/02/2023   HGB 11.7 (L) 07/02/2023   HCT 36.6 07/02/2023   MCV 99.7 07/02/2023   PLT 145 (L) 07/02/2023   Lab Results  Component Value Date   NA 139 07/02/2023   CL 107 07/02/2023   K 4.0 07/02/2023   CO2 25 07/02/2023   BUN 22 07/02/2023   CREATININE 1.42 (H) 07/02/2023   GFRNONAA 39 (L) 07/02/2023   CALCIUM 9.0 07/02/2023   PHOS 4.0 03/10/2020   ALBUMIN 3.7 07/02/2023   GLUCOSE 112 (H) 07/02/2023    Anesthesia Physical Anesthesia Plan  ASA: 3  Anesthesia Plan: General   Post-op Pain Management: Tylenol PO (pre-op)*   Induction: Intravenous  PONV Risk Score and Plan: 3 and Dexamethasone, Ondansetron and Treatment may vary due to age or medical condition  Airway Management Planned: Oral ETT  Additional Equipment: Arterial line  Intra-op Plan:   Post-operative Plan: Extubation in OR and Possible Post-op  intubation/ventilation  Informed Consent: I have reviewed the patients History and Physical, chart, labs and discussed the procedure including the risks, benefits and alternatives for the proposed anesthesia with the patient or authorized representative who has indicated his/her understanding and acceptance.     Dental advisory given  Plan Discussed with: CRNA  Anesthesia Plan Comments: (PAT note by Heather Poles, PA-C:  72 year old female follows cardiology for history of PAD, HLD, HTN, renal artery stenosis s/p stenting, carotid stenosis s/p left CEA 2010, CAD s/p DES to ostial left main 11/19/2022.  Seen by Heather Fabian, NP 05/03/2023 for preop evaluation.  Per note, "Chart reviewed as part of pre-operative protocol coverage. Given past medical history and time since last visit, based on ACC/AHA guidelines, [name] would be at acceptable risk for the planned procedure without further cardiovascular testing. Her RCRI is moderate risk, 6.6% risk of major cardiac event.  She is able to complete greater than 4 METS of physical activity. Patient was advised that if she develops new symptoms prior to surgery to contact our office to arrange a follow-up appointment.  She verbalized understanding."  Per vascular surgery, she is to continue her dual antiplatelet therapy.  Other pertinent history includes GERD, hiatal hernia, rheumatoid arthritis, OSA not on CPAP, hypothyroidism, stage III CKD.  Preop labs reviewed, creatinine mildly elevated 1.42, mild anemia with hemoglobin 11.7, otherwise unremarkable.  EKG  06/09/2023: Bradycardia.  Rate 55.  Nonspecific ST abnormality.  Carotid duplex 04/06/2023: Summary:  Right Carotid: Velocities in the right ICA are consistent with a 80-99% stenosis. The ECA appears >50% stenosed.  Left Carotid: Velocities in the left ICA are consistent with a 1-39% stenosis. Hemodynamically significant plaque >50% visualized in the CCA.  Vertebrals: Bilateral vertebral  arteries demonstrate antegrade flow.  Subclavians: Bilateral subclavian arteries were stenotic.   Cath and PCI 11/19/2022:   Mid Cx lesion is 20% stenosed.   Mid Cx to Dist Cx lesion is 70% stenosed.   Prox Cx lesion is 60% stenosed.   Ost LM to Mid LM lesion is 90% stenosed.   A drug-eluting stent was successfully placed using a STENT MEGATRON 3.5X8.   Post intervention, there is a 10% residual stenosis.   Successful IVUS guided high risk rotational atherectomy and drug-eluting stent placement to the ostial left main coronary artery extending into the distal segment just before the bifurcation.  Difficult procedure overall due to heavy eccentric calcifications and short left main. The patient had chest pain and elevated blood pressure with balloon inflations and thus she was started on nitroglycerin drip with resolution of symptoms.   Recommendations: Dual antiplatelet therapy for at least 1 year. Aggressive treatment of risk factors.  Echocardiogram 11/17/2022: 1. Left ventricular ejection fraction, by estimation, is 55 to 60%. The  left ventricle has normal function. The left ventricle has no regional  wall motion abnormalities. There is mild concentric left ventricular  hypertrophy. Left ventricular diastolic  parameters are consistent with Grade I diastolic dysfunction (impaired  relaxation).   2. Right ventricular systolic function is normal. The right ventricular  size is normal. There is normal pulmonary artery systolic pressure. The  estimated right ventricular systolic pressure is 19.2 mmHg.   3. The mitral valve is normal in structure. Trivial mitral valve  regurgitation. No evidence of mitral stenosis.   4. The aortic valve is tricuspid. There is mild calcification of the  aortic valve. Aortic valve regurgitation is not visualized. No aortic  stenosis is present.   5. The inferior vena cava is normal in size with greater than 50%  respiratory variability, suggesting  right atrial pressure of 3 mmHg.   )         Anesthesia Quick Evaluation

## 2023-07-03 ENCOUNTER — Other Ambulatory Visit: Payer: Self-pay

## 2023-07-03 ENCOUNTER — Inpatient Hospital Stay (HOSPITAL_COMMUNITY): Payer: 59

## 2023-07-03 ENCOUNTER — Inpatient Hospital Stay (HOSPITAL_COMMUNITY)
Admission: RE | Admit: 2023-07-03 | Discharge: 2023-07-05 | DRG: 629 | Disposition: A | Discharge status: Home / Self Care | Payer: 59 | Attending: Surgery | Admitting: Surgery

## 2023-07-03 ENCOUNTER — Other Ambulatory Visit: Payer: Self-pay | Admitting: Cardiology

## 2023-07-03 ENCOUNTER — Encounter (HOSPITAL_COMMUNITY): Payer: Self-pay | Admitting: Surgery

## 2023-07-03 ENCOUNTER — Encounter (HOSPITAL_COMMUNITY)
Admission: RE | Disposition: A | Discharge status: Home / Self Care | Payer: Self-pay | Source: Home / Self Care | Attending: Surgery

## 2023-07-03 ENCOUNTER — Inpatient Hospital Stay (HOSPITAL_COMMUNITY): Payer: 59 | Admitting: Physician Assistant

## 2023-07-03 DIAGNOSIS — Z7982 Long term (current) use of aspirin: Secondary | ICD-10-CM | POA: Diagnosis not present

## 2023-07-03 DIAGNOSIS — E039 Hypothyroidism, unspecified: Secondary | ICD-10-CM

## 2023-07-03 DIAGNOSIS — I358 Other nonrheumatic aortic valve disorders: Secondary | ICD-10-CM | POA: Diagnosis present

## 2023-07-03 DIAGNOSIS — Z833 Family history of diabetes mellitus: Secondary | ICD-10-CM | POA: Diagnosis not present

## 2023-07-03 DIAGNOSIS — Z79890 Hormone replacement therapy (postmenopausal): Secondary | ICD-10-CM | POA: Diagnosis not present

## 2023-07-03 DIAGNOSIS — Z885 Allergy status to narcotic agent status: Secondary | ICD-10-CM | POA: Diagnosis not present

## 2023-07-03 DIAGNOSIS — Z888 Allergy status to other drugs, medicaments and biological substances status: Secondary | ICD-10-CM

## 2023-07-03 DIAGNOSIS — Z79899 Other long term (current) drug therapy: Secondary | ICD-10-CM | POA: Diagnosis not present

## 2023-07-03 DIAGNOSIS — I10 Essential (primary) hypertension: Secondary | ICD-10-CM

## 2023-07-03 DIAGNOSIS — G2581 Restless legs syndrome: Secondary | ICD-10-CM | POA: Diagnosis present

## 2023-07-03 DIAGNOSIS — Z87891 Personal history of nicotine dependence: Secondary | ICD-10-CM | POA: Diagnosis not present

## 2023-07-03 DIAGNOSIS — D62 Acute posthemorrhagic anemia: Secondary | ICD-10-CM | POA: Diagnosis present

## 2023-07-03 DIAGNOSIS — I701 Atherosclerosis of renal artery: Secondary | ICD-10-CM | POA: Diagnosis present

## 2023-07-03 DIAGNOSIS — Z8261 Family history of arthritis: Secondary | ICD-10-CM

## 2023-07-03 DIAGNOSIS — E785 Hyperlipidemia, unspecified: Secondary | ICD-10-CM | POA: Diagnosis present

## 2023-07-03 DIAGNOSIS — Z809 Family history of malignant neoplasm, unspecified: Secondary | ICD-10-CM

## 2023-07-03 DIAGNOSIS — I959 Hypotension, unspecified: Secondary | ICD-10-CM | POA: Diagnosis present

## 2023-07-03 DIAGNOSIS — K219 Gastro-esophageal reflux disease without esophagitis: Secondary | ICD-10-CM | POA: Diagnosis present

## 2023-07-03 DIAGNOSIS — Z91048 Other nonmedicinal substance allergy status: Secondary | ICD-10-CM

## 2023-07-03 DIAGNOSIS — I6523 Occlusion and stenosis of bilateral carotid arteries: Secondary | ICD-10-CM

## 2023-07-03 DIAGNOSIS — I2510 Atherosclerotic heart disease of native coronary artery without angina pectoris: Secondary | ICD-10-CM | POA: Diagnosis present

## 2023-07-03 DIAGNOSIS — Z981 Arthrodesis status: Secondary | ICD-10-CM | POA: Diagnosis not present

## 2023-07-03 DIAGNOSIS — Z9889 Other specified postprocedural states: Principal | ICD-10-CM

## 2023-07-03 DIAGNOSIS — F419 Anxiety disorder, unspecified: Secondary | ICD-10-CM | POA: Diagnosis present

## 2023-07-03 DIAGNOSIS — M797 Fibromyalgia: Secondary | ICD-10-CM | POA: Diagnosis present

## 2023-07-03 DIAGNOSIS — Z7902 Long term (current) use of antithrombotics/antiplatelets: Secondary | ICD-10-CM

## 2023-07-03 DIAGNOSIS — I6521 Occlusion and stenosis of right carotid artery: Secondary | ICD-10-CM | POA: Diagnosis present

## 2023-07-03 DIAGNOSIS — Z832 Family history of diseases of the blood and blood-forming organs and certain disorders involving the immune mechanism: Secondary | ICD-10-CM | POA: Diagnosis not present

## 2023-07-03 DIAGNOSIS — F32A Depression, unspecified: Secondary | ICD-10-CM | POA: Diagnosis present

## 2023-07-03 DIAGNOSIS — Z8249 Family history of ischemic heart disease and other diseases of the circulatory system: Secondary | ICD-10-CM | POA: Diagnosis not present

## 2023-07-03 DIAGNOSIS — I252 Old myocardial infarction: Secondary | ICD-10-CM | POA: Diagnosis not present

## 2023-07-03 DIAGNOSIS — Z83438 Family history of other disorder of lipoprotein metabolism and other lipidemia: Secondary | ICD-10-CM

## 2023-07-03 DIAGNOSIS — G4733 Obstructive sleep apnea (adult) (pediatric): Secondary | ICD-10-CM | POA: Diagnosis present

## 2023-07-03 HISTORY — PX: INSERTION OF RETROGRADE CAROTID STENT: SHX5869

## 2023-07-03 HISTORY — PX: ENDARTERECTOMY: SHX5162

## 2023-07-03 LAB — PREPARE RBC (CROSSMATCH)

## 2023-07-03 SURGERY — ENDARTERECTOMY, CAROTID
Anesthesia: General | Laterality: Right

## 2023-07-03 MED ORDER — ONDANSETRON HCL 4 MG/2ML IJ SOLN
INTRAMUSCULAR | Status: AC
  Filled 2023-07-03: qty 2

## 2023-07-03 MED ORDER — ROCURONIUM BROMIDE 10 MG/ML (PF) SYRINGE
PREFILLED_SYRINGE | INTRAVENOUS | Status: DC | PRN
  Administered 2023-07-03: 40 mg via INTRAVENOUS
  Administered 2023-07-03 (×4): 10 mg via INTRAVENOUS

## 2023-07-03 MED ORDER — CHLORHEXIDINE GLUCONATE CLOTH 2 % EX PADS: 6.0000 | MEDICATED_PAD | Freq: Once | CUTANEOUS | Status: DC

## 2023-07-03 MED ORDER — DOPAMINE-DEXTROSE 3.2-5 MG/ML-% IV SOLN
5.0000 ug/kg/min | INTRAVENOUS | Status: DC
  Administered 2023-07-03: 4 ug/kg/min via INTRAVENOUS

## 2023-07-03 MED ORDER — KETOROLAC TROMETHAMINE 0.5 % OP SOLN: 1.0000 [drp] | Freq: Four times a day (QID) | OPHTHALMIC | Status: DC

## 2023-07-03 MED ORDER — VASOPRESSIN 20 UNIT/ML IV SOLN
INTRAVENOUS | Status: DC | PRN
  Administered 2023-07-03: 1 [IU] via INTRAVENOUS
  Administered 2023-07-03 (×2): .5 [IU] via INTRAVENOUS

## 2023-07-03 MED ORDER — VASOPRESSIN 20 UNIT/ML IV SOLN
INTRAVENOUS | Status: AC
  Filled 2023-07-03: qty 1

## 2023-07-03 MED ORDER — LABETALOL HCL 5 MG/ML IV SOLN
INTRAVENOUS | Status: DC | PRN
  Administered 2023-07-03: 10 mg via INTRAVENOUS

## 2023-07-03 MED ORDER — CLEVIDIPINE BUTYRATE 0.5 MG/ML IV EMUL
INTRAVENOUS | Status: DC | PRN
  Administered 2023-07-03: 1.5 mg/h via INTRAVENOUS

## 2023-07-03 MED ORDER — EPHEDRINE SULFATE-NACL 50-0.9 MG/10ML-% IV SOSY
PREFILLED_SYRINGE | INTRAVENOUS | Status: DC | PRN
  Administered 2023-07-03: 5 mg via INTRAVENOUS
  Administered 2023-07-03 (×2): 2.5 mg via INTRAVENOUS
  Administered 2023-07-03: 10 mg via INTRAVENOUS
  Administered 2023-07-03: 2.5 mg via INTRAVENOUS
  Administered 2023-07-03: 5 mg via INTRAVENOUS
  Administered 2023-07-03: 2.5 mg via INTRAVENOUS
  Administered 2023-07-03: 10 mg via INTRAVENOUS
  Administered 2023-07-03 (×2): 2.5 mg via INTRAVENOUS

## 2023-07-03 MED ORDER — DEXAMETHASONE SODIUM PHOSPHATE 10 MG/ML IJ SOLN
INTRAMUSCULAR | Status: AC
  Filled 2023-07-03: qty 1

## 2023-07-03 MED ORDER — ROSUVASTATIN CALCIUM 20 MG PO TABS
40.0000 mg | ORAL_TABLET | Freq: Every day | ORAL | Status: DC
  Administered 2023-07-03 – 2023-07-05 (×3): 40 mg via ORAL
  Filled 2023-07-03 (×3): qty 2

## 2023-07-03 MED ORDER — FENTANYL CITRATE (PF) 100 MCG/2ML IJ SOLN
25.0000 ug | INTRAMUSCULAR | Status: DC | PRN
  Administered 2023-07-03: 25 ug via INTRAVENOUS

## 2023-07-03 MED ORDER — GUAIFENESIN-DM 100-10 MG/5ML PO SYRP: 15.0000 mL | ORAL_SOLUTION | ORAL | Status: DC | PRN

## 2023-07-03 MED ORDER — CEFAZOLIN SODIUM-DEXTROSE 2-4 GM/100ML-% IV SOLN
2.0000 g | INTRAVENOUS | Status: AC
  Administered 2023-07-03: 2 g via INTRAVENOUS
  Filled 2023-07-03: qty 100

## 2023-07-03 MED ORDER — PROTAMINE SULFATE 10 MG/ML IV SOLN
INTRAVENOUS | Status: AC
  Filled 2023-07-03: qty 5

## 2023-07-03 MED ORDER — GLYCOPYRROLATE PF 0.2 MG/ML IJ SOSY
PREFILLED_SYRINGE | INTRAMUSCULAR | Status: DC | PRN
  Administered 2023-07-03 (×2): .2 mg via INTRAVENOUS

## 2023-07-03 MED ORDER — SODIUM CHLORIDE 0.9% FLUSH
3.0000 mL | Freq: Two times a day (BID) | INTRAVENOUS | Status: DC
  Administered 2023-07-04 – 2023-07-05 (×3): 3 mL via INTRAVENOUS

## 2023-07-03 MED ORDER — PROTAMINE SULFATE 10 MG/ML IV SOLN
INTRAVENOUS | Status: DC | PRN
  Administered 2023-07-03: 10 mg via INTRAVENOUS
  Administered 2023-07-03: 40 mg via INTRAVENOUS

## 2023-07-03 MED ORDER — LIDOCAINE 2% (20 MG/ML) 5 ML SYRINGE
INTRAMUSCULAR | Status: AC
  Filled 2023-07-03: qty 5

## 2023-07-03 MED ORDER — BSS IO SOLN: 15.0000 mL | Freq: Once | INTRAOCULAR | Status: AC

## 2023-07-03 MED ORDER — CEFAZOLIN SODIUM-DEXTROSE 2-4 GM/100ML-% IV SOLN
2.0000 g | Freq: Three times a day (TID) | INTRAVENOUS | Status: AC
  Administered 2023-07-03 – 2023-07-04 (×2): 2 g via INTRAVENOUS
  Filled 2023-07-03 (×2): qty 100

## 2023-07-03 MED ORDER — SODIUM CHLORIDE 0.9% FLUSH
3.0000 mL | INTRAVENOUS | Status: DC | PRN
Start: 2023-07-03 — End: 2023-07-05

## 2023-07-03 MED ORDER — AMISULPRIDE (ANTIEMETIC) 5 MG/2ML IV SOLN
INTRAVENOUS | Status: AC
  Administered 2023-07-03: 10 mg via INTRAVENOUS
  Filled 2023-07-03: qty 4

## 2023-07-03 MED ORDER — FENTANYL CITRATE (PF) 100 MCG/2ML IJ SOLN
INTRAMUSCULAR | Status: AC
  Administered 2023-07-03: 25 ug via INTRAVENOUS
  Filled 2023-07-03: qty 2

## 2023-07-03 MED ORDER — FENTANYL CITRATE (PF) 250 MCG/5ML IJ SOLN
INTRAMUSCULAR | Status: DC | PRN
  Administered 2023-07-03: 50 ug via INTRAVENOUS
  Administered 2023-07-03: 75 ug via INTRAVENOUS
  Administered 2023-07-03: 50 ug via INTRAVENOUS
  Administered 2023-07-03: 25 ug via INTRAVENOUS

## 2023-07-03 MED ORDER — HYDROMORPHONE HCL 1 MG/ML IJ SOLN
0.5000 mg | INTRAMUSCULAR | Status: DC | PRN
  Administered 2023-07-03 – 2023-07-05 (×7): 0.5 mg via INTRAVENOUS
  Filled 2023-07-03 (×5): qty 0.5
  Filled 2023-07-03: qty 1
  Filled 2023-07-03: qty 0.5

## 2023-07-03 MED ORDER — HEPARIN 6000 UNIT IRRIGATION SOLUTION
Status: AC
  Filled 2023-07-03: qty 500

## 2023-07-03 MED ORDER — LIDOCAINE 2% (20 MG/ML) 5 ML SYRINGE
INTRAMUSCULAR | Status: DC | PRN
  Administered 2023-07-03: 60 mg via INTRAVENOUS
  Administered 2023-07-03: 100 mg via INTRAVENOUS

## 2023-07-03 MED ORDER — LIDOCAINE HCL (PF) 1 % IJ SOLN
INTRAMUSCULAR | Status: AC
  Filled 2023-07-03: qty 5

## 2023-07-03 MED ORDER — MAGNESIUM SULFATE 2 GM/50ML IV SOLN: 2.0000 g | Freq: Every day | INTRAVENOUS | Status: DC | PRN

## 2023-07-03 MED ORDER — DOCUSATE SODIUM 100 MG PO CAPS
100.0000 mg | ORAL_CAPSULE | Freq: Every day | ORAL | Status: DC
  Administered 2023-07-04 – 2023-07-05 (×2): 100 mg via ORAL
  Filled 2023-07-03 (×2): qty 1

## 2023-07-03 MED ORDER — SUGAMMADEX SODIUM 200 MG/2ML IV SOLN
INTRAVENOUS | Status: DC | PRN
  Administered 2023-07-03: 200 mg via INTRAVENOUS

## 2023-07-03 MED ORDER — SODIUM CHLORIDE 0.9 % IV SOLN: 10.0000 mL/h | Freq: Once | INTRAVENOUS | Status: DC

## 2023-07-03 MED ORDER — PHENOL 1.4 % MT LIQD: 1.0000 | OROMUCOSAL | Status: DC | PRN

## 2023-07-03 MED ORDER — POLYMYXIN B-TRIMETHOPRIM 10000-0.1 UNIT/ML-% OP SOLN
1.0000 [drp] | Freq: Four times a day (QID) | OPHTHALMIC | Status: DC
  Administered 2023-07-03: 1 [drp] via OPHTHALMIC
  Filled 2023-07-03 (×3): qty 10

## 2023-07-03 MED ORDER — ONDANSETRON HCL 4 MG/2ML IJ SOLN
INTRAMUSCULAR | Status: DC | PRN
  Administered 2023-07-03: 4 mg via INTRAVENOUS

## 2023-07-03 MED ORDER — BISACODYL 5 MG PO TBEC: 5.0000 mg | DELAYED_RELEASE_TABLET | Freq: Every day | ORAL | Status: DC | PRN

## 2023-07-03 MED ORDER — ACETAMINOPHEN 10 MG/ML IV SOLN
INTRAVENOUS | Status: AC
  Administered 2023-07-03: 1000 mg via INTRAVENOUS
  Filled 2023-07-03: qty 100

## 2023-07-03 MED ORDER — ALBUMIN HUMAN 5 % IV SOLN: INTRAVENOUS | Status: DC | PRN

## 2023-07-03 MED ORDER — ACETAMINOPHEN 650 MG RE SUPP: 325.0000 mg | RECTAL | Status: DC | PRN

## 2023-07-03 MED ORDER — EPHEDRINE 5 MG/ML INJ
INTRAVENOUS | Status: AC
  Filled 2023-07-03: qty 5

## 2023-07-03 MED ORDER — IODIXANOL 320 MG/ML IV SOLN
INTRAVENOUS | Status: DC | PRN
  Administered 2023-07-03: 10 mL via INTRA_ARTERIAL

## 2023-07-03 MED ORDER — AMISULPRIDE (ANTIEMETIC) 5 MG/2ML IV SOLN: 10.0000 mg | Freq: Once | INTRAVENOUS | Status: AC | PRN

## 2023-07-03 MED ORDER — BSS IO SOLN
INTRAOCULAR | Status: AC
  Administered 2023-07-03: 15 mL
  Filled 2023-07-03: qty 15

## 2023-07-03 MED ORDER — ACETAMINOPHEN 500 MG PO TABS
1000.0000 mg | ORAL_TABLET | Freq: Once | ORAL | Status: AC
  Administered 2023-07-03: 1000 mg via ORAL
  Filled 2023-07-03: qty 2

## 2023-07-03 MED ORDER — 0.9 % SODIUM CHLORIDE (POUR BTL) OPTIME
TOPICAL | Status: DC | PRN
  Administered 2023-07-03: 1000 mL

## 2023-07-03 MED ORDER — KETOROLAC TROMETHAMINE 0.5 % OP SOLN
OPHTHALMIC | Status: AC
  Administered 2023-07-03: 1 [drp] via OPHTHALMIC
  Filled 2023-07-03: qty 5

## 2023-07-03 MED ORDER — ACETAMINOPHEN 10 MG/ML IV SOLN: 1000.0000 mg | Freq: Once | INTRAVENOUS | Status: AC

## 2023-07-03 MED ORDER — ACETAMINOPHEN 325 MG PO TABS
325.0000 mg | ORAL_TABLET | ORAL | Status: DC | PRN
  Administered 2023-07-04: 325 mg via ORAL
  Administered 2023-07-05: 650 mg via ORAL
  Filled 2023-07-03 (×2): qty 2

## 2023-07-03 MED ORDER — SENNOSIDES-DOCUSATE SODIUM 8.6-50 MG PO TABS: 1.0000 | ORAL_TABLET | Freq: Every evening | ORAL | Status: DC | PRN

## 2023-07-03 MED ORDER — PANTOPRAZOLE SODIUM 40 MG PO TBEC
40.0000 mg | DELAYED_RELEASE_TABLET | Freq: Every day | ORAL | Status: DC
  Administered 2023-07-03 – 2023-07-05 (×3): 40 mg via ORAL
  Filled 2023-07-03 (×3): qty 1

## 2023-07-03 MED ORDER — POTASSIUM CHLORIDE CRYS ER 20 MEQ PO TBCR: 20.0000 meq | EXTENDED_RELEASE_TABLET | Freq: Every day | ORAL | Status: DC | PRN

## 2023-07-03 MED ORDER — CHLORHEXIDINE GLUCONATE 0.12 % MT SOLN
15.0000 mL | Freq: Once | OROMUCOSAL | Status: AC
  Administered 2023-07-03: 15 mL via OROMUCOSAL
  Filled 2023-07-03: qty 15

## 2023-07-03 MED ORDER — CALCIUM CHLORIDE 10 % IV SOLN
INTRAVENOUS | Status: DC | PRN
  Administered 2023-07-03: 500 mg via INTRAVENOUS

## 2023-07-03 MED ORDER — DEXAMETHASONE SODIUM PHOSPHATE 10 MG/ML IJ SOLN
INTRAMUSCULAR | Status: DC | PRN
  Administered 2023-07-03: 10 mg via INTRAVENOUS

## 2023-07-03 MED ORDER — SODIUM CHLORIDE 0.9 % IV SOLN: INTRAVENOUS | Status: DC | PRN

## 2023-07-03 MED ORDER — SURGIFLO WITH THROMBIN (HEMOSTATIC MATRIX KIT) OPTIME
TOPICAL | Status: DC | PRN
  Administered 2023-07-03: 2 via TOPICAL

## 2023-07-03 MED ORDER — ALUM & MAG HYDROXIDE-SIMETH 200-200-20 MG/5ML PO SUSP: 15.0000 mL | ORAL | Status: DC | PRN

## 2023-07-03 MED ORDER — SODIUM CHLORIDE 0.9 % IV SOLN: INTRAVENOUS | Status: AC

## 2023-07-03 MED ORDER — DOPAMINE-DEXTROSE 3.2-5 MG/ML-% IV SOLN
INTRAVENOUS | Status: AC
  Administered 2023-07-03: 5 ug/kg/min via INTRAVENOUS
  Filled 2023-07-03: qty 250

## 2023-07-03 MED ORDER — SODIUM CHLORIDE 0.9 % IV SOLN: 250.0000 mL | INTRAVENOUS | Status: AC | PRN

## 2023-07-03 MED ORDER — SODIUM CHLORIDE 0.9 % IV SOLN: 500.0000 mL | Freq: Once | INTRAVENOUS | Status: DC | PRN

## 2023-07-03 MED ORDER — CLOPIDOGREL BISULFATE 75 MG PO TABS
75.0000 mg | ORAL_TABLET | Freq: Every day | ORAL | Status: DC
  Administered 2023-07-04 – 2023-07-05 (×2): 75 mg via ORAL
  Filled 2023-07-03 (×2): qty 1

## 2023-07-03 MED ORDER — FENTANYL CITRATE (PF) 250 MCG/5ML IJ SOLN
INTRAMUSCULAR | Status: AC
  Filled 2023-07-03: qty 5

## 2023-07-03 MED ORDER — ORAL CARE MOUTH RINSE: 15.0000 mL | Freq: Once | OROMUCOSAL | Status: AC

## 2023-07-03 MED ORDER — PROPOFOL 10 MG/ML IV BOLUS
INTRAVENOUS | Status: AC
  Filled 2023-07-03: qty 20

## 2023-07-03 MED ORDER — PHENYLEPHRINE 80 MCG/ML (10ML) SYRINGE FOR IV PUSH (FOR BLOOD PRESSURE SUPPORT)
PREFILLED_SYRINGE | INTRAVENOUS | Status: DC | PRN
  Administered 2023-07-03: 80 ug via INTRAVENOUS
  Administered 2023-07-03 (×2): 240 ug via INTRAVENOUS
  Administered 2023-07-03: 160 ug via INTRAVENOUS
  Administered 2023-07-03: 80 ug via INTRAVENOUS

## 2023-07-03 MED ORDER — DOPAMINE-DEXTROSE 3.2-5 MG/ML-% IV SOLN
5.0000 ug/kg/min | INTRAVENOUS | Status: DC
  Filled 2023-07-03: qty 250

## 2023-07-03 MED ORDER — HEPARIN SODIUM (PORCINE) 5000 UNIT/ML IJ SOLN
5000.0000 [IU] | Freq: Three times a day (TID) | INTRAMUSCULAR | Status: DC
  Administered 2023-07-04 – 2023-07-05 (×4): 5000 [IU] via SUBCUTANEOUS
  Filled 2023-07-03 (×4): qty 1

## 2023-07-03 MED ORDER — LABETALOL HCL 5 MG/ML IV SOLN: 10.0000 mg | INTRAVENOUS | Status: DC | PRN

## 2023-07-03 MED ORDER — PHENYLEPHRINE HCL-NACL 20-0.9 MG/250ML-% IV SOLN
INTRAVENOUS | Status: DC | PRN
  Administered 2023-07-03: 20 ug/min via INTRAVENOUS

## 2023-07-03 MED ORDER — PROPOFOL 10 MG/ML IV BOLUS
INTRAVENOUS | Status: DC | PRN
  Administered 2023-07-03: 100 mg via INTRAVENOUS

## 2023-07-03 MED ORDER — HYDRALAZINE HCL 20 MG/ML IJ SOLN: 5.0000 mg | INTRAMUSCULAR | Status: DC | PRN

## 2023-07-03 MED ORDER — ROCURONIUM BROMIDE 10 MG/ML (PF) SYRINGE
PREFILLED_SYRINGE | INTRAVENOUS | Status: AC
  Filled 2023-07-03: qty 10

## 2023-07-03 MED ORDER — ASPIRIN 81 MG PO TBEC
81.0000 mg | DELAYED_RELEASE_TABLET | Freq: Every day | ORAL | Status: DC
  Administered 2023-07-04 – 2023-07-05 (×2): 81 mg via ORAL
  Filled 2023-07-03 (×2): qty 1

## 2023-07-03 MED ORDER — SODIUM CHLORIDE 0.9 % IR SOLN
Status: DC | PRN
  Administered 2023-07-03: 2000 mL

## 2023-07-03 MED ORDER — METOPROLOL TARTRATE 5 MG/5ML IV SOLN: 2.0000 mg | INTRAVENOUS | Status: DC | PRN

## 2023-07-03 MED ORDER — LACTATED RINGERS IV SOLN: INTRAVENOUS | Status: DC | PRN

## 2023-07-03 MED ORDER — HEPARIN SODIUM (PORCINE) 1000 UNIT/ML IJ SOLN
INTRAMUSCULAR | Status: DC | PRN
  Administered 2023-07-03: 1000 [IU] via INTRAVENOUS
  Administered 2023-07-03: 3000 [IU] via INTRAVENOUS
  Administered 2023-07-03: 7000 [IU] via INTRAVENOUS

## 2023-07-03 MED ORDER — HEPARIN 6000 UNIT IRRIGATION SOLUTION
Status: DC | PRN
  Administered 2023-07-03: 1

## 2023-07-03 MED ORDER — SUGAMMADEX SODIUM 200 MG/2ML IV SOLN: INTRAVENOUS | Status: DC | PRN

## 2023-07-03 SURGICAL SUPPLY — 69 items
BAG BANDED W/RUBBER/TAPE 36X54 (MISCELLANEOUS) IMPLANT
BAG COUNTER SPONGE SURGICOUNT (BAG) ×1 IMPLANT
CANISTER SUCT 3000ML PPV (MISCELLANEOUS) ×1 IMPLANT
CATH ROBINSON RED A/P 18FR (CATHETERS) ×1 IMPLANT
CATH STRAIGHT 5FR 65CM (CATHETERS) IMPLANT
CATH SUCT 10FR WHISTLE TIP (CATHETERS) ×1 IMPLANT
CLIP TI MEDIUM 24 (CLIP) ×1 IMPLANT
CLIP TI MEDIUM 6 (CLIP) ×1 IMPLANT
CLIP TI WIDE RED SMALL 24 (CLIP) ×1 IMPLANT
CLIP TI WIDE RED SMALL 6 (CLIP) ×1 IMPLANT
COVER DOME SNAP 22 D (MISCELLANEOUS) IMPLANT
COVER PROBE W GEL 5X96 (DRAPES) IMPLANT
DERMABOND ADVANCED .7 DNX12 (GAUZE/BANDAGES/DRESSINGS) ×1 IMPLANT
DERMABOND ADVANCED .7 DNX6 (GAUZE/BANDAGES/DRESSINGS) IMPLANT
DRAIN CHANNEL 15F RND FF W/TCR (WOUND CARE) IMPLANT
DRSG TEGADERM 2-3/8X2-3/4 SM (GAUZE/BANDAGES/DRESSINGS) IMPLANT
ELECT REM PT RETURN 9FT ADLT (ELECTROSURGICAL) ×1
ELECTRODE REM PT RTRN 9FT ADLT (ELECTROSURGICAL) ×1 IMPLANT
EVACUATOR SILICONE 100CC (DRAIN) IMPLANT
FELT TEFLON 1X6 (MISCELLANEOUS) IMPLANT
GAUZE SPONGE 2X2 STRL 8-PLY (GAUZE/BANDAGES/DRESSINGS) IMPLANT
GLOVE SURG SS PI 7.5 STRL IVOR (GLOVE) ×3 IMPLANT
GOWN STRL REUS W/ TWL LRG LVL3 (GOWN DISPOSABLE) ×2 IMPLANT
GOWN STRL REUS W/ TWL XL LVL3 (GOWN DISPOSABLE) ×1 IMPLANT
GUIDEWIRE ANGLED .035X150CM (WIRE) IMPLANT
HEMOSTAT SNOW SURGICEL 2X4 (HEMOSTASIS) IMPLANT
INSERT FOGARTY SM (MISCELLANEOUS) IMPLANT
KIT BASIN OR (CUSTOM PROCEDURE TRAY) ×1 IMPLANT
KIT ENCORE 26 ADVANTAGE (KITS) ×1 IMPLANT
KIT SHUNT ARGYLE CAROTID ART 6 (VASCULAR PRODUCTS) IMPLANT
KIT TURNOVER KIT B (KITS) ×1 IMPLANT
NDL HYPO 25GX1X1/2 BEV (NEEDLE) IMPLANT
NDL PERC 18GX7CM (NEEDLE) ×1 IMPLANT
NEEDLE HYPO 25GX1X1/2 BEV (NEEDLE) IMPLANT
NEEDLE PERC 18GX7CM (NEEDLE) IMPLANT
NS IRRIG 1000ML POUR BTL (IV SOLUTION) ×3 IMPLANT
PACK CAROTID (CUSTOM PROCEDURE TRAY) ×1 IMPLANT
PAD ARMBOARD 7.5X6 YLW CONV (MISCELLANEOUS) ×2 IMPLANT
PATCH VASC XENOSURE 1X6 (Vascular Products) IMPLANT
POSITIONER HEAD DONUT 9IN (MISCELLANEOUS) ×1 IMPLANT
SET MICROPUNCTURE 5F STIFF (MISCELLANEOUS) IMPLANT
SET WALTER ACTIVATION W/DRAPE (SET/KITS/TRAYS/PACK) IMPLANT
SHEATH PINNACLE 5F 10CM (SHEATH) IMPLANT
SHEATH PINNACLE R/O II 7F 4CM (SHEATH) IMPLANT
SHUNT CAROTID BYPASS 10 (VASCULAR PRODUCTS) IMPLANT
SHUNT CAROTID BYPASS 12FRX15.5 (VASCULAR PRODUCTS) IMPLANT
SPONGE SURGIFOAM ABS GEL 100 (HEMOSTASIS) IMPLANT
SPONGE T-LAP 18X18 ~~LOC~~+RFID (SPONGE) IMPLANT
STOPCOCK MORSE 400PSI 3WAY (MISCELLANEOUS) IMPLANT
SURGIFLO W/THROMBIN 8M KIT (HEMOSTASIS) IMPLANT
SUT ETHILON 3 0 PS 1 (SUTURE) IMPLANT
SUT PROLENE 4-0 RB1 .5 CRCL 36 (SUTURE) IMPLANT
SUT PROLENE 4-0 RB1 18X2 ARM (SUTURE) IMPLANT
SUT PROLENE 5 0 C 1 24 (SUTURE) IMPLANT
SUT PROLENE 6 0 BV (SUTURE) ×2 IMPLANT
SUT PROLENE BLUE 7 0 (SUTURE) IMPLANT
SUT SILK 2 0 PERMA HAND 18 BK (SUTURE) IMPLANT
SUT SILK 3-0 18XBRD TIE 12 (SUTURE) IMPLANT
SUT VIC AB 3-0 SH 27X BRD (SUTURE) ×2 IMPLANT
SUT VIC AB 3-0 X1 27 (SUTURE) ×1 IMPLANT
SUT VICRYL 4-0 PS2 18IN ABS (SUTURE) ×1 IMPLANT
SYR 10ML LL (SYRINGE) IMPLANT
SYR 20ML LL LF (SYRINGE) IMPLANT
SYR CONTROL 10ML LL (SYRINGE) IMPLANT
TOWEL GREEN STERILE (TOWEL DISPOSABLE) ×1 IMPLANT
TRAY FOLEY MTR SLVR 16FR STAT (SET/KITS/TRAYS/PACK) ×1 IMPLANT
WATER STERILE IRR 1000ML POUR (IV SOLUTION) ×1 IMPLANT
WIRE BENTSON .035X145CM (WIRE) ×1 IMPLANT
WIRE ROSEN 145CM (WIRE) IMPLANT

## 2023-07-03 NOTE — Transfer of Care (Signed)
Immediate Anesthesia Transfer of Care Note  Patient: [name]  Procedure(s) Performed: ENDARTERECTOMY CAROTID WITH XENOSURE BIOLOGIC PATCH ANGIOPLASTY (Right) Right common carotid angiogram (Right)  Patient Location: PACU  Anesthesia Type:General  Level of Consciousness: awake, alert , and oriented  Airway & Oxygen Therapy: Patient Spontanous Breathing  Post-op Assessment: Report given to RN and Post -op Vital signs reviewed and stable  Post vital signs: Reviewed and stable  Last Vitals:  Vitals Value Taken Time  BP 94/47 07/03/23 1230  Temp 98   Pulse 70 07/03/23 1244  Resp 12 07/03/23 1244  SpO2 100 % 07/03/23 1244  Vitals shown include unfiled device data.  Last Pain:  Vitals:   07/03/23 0613  TempSrc: Oral  PainSc:          Complications: No notable events documented.

## 2023-07-03 NOTE — Anesthesia Procedure Notes (Addendum)
Arterial Line Insertion Start/End12/20/2024 6:55 AM, 07/03/2023 7:00 AM Performed by: Marcene Duos, MD, Carroll Sage, RN, CRNA  Patient location: OOR procedure area. Preanesthetic checklist: patient identified, IV checked, site marked, risks and benefits discussed, surgical consent, monitors and equipment checked, pre-op evaluation, timeout performed and anesthesia consent Lidocaine 1% used for infiltration Left, radial was placed Catheter size: 20 G Hand hygiene performed   Attempts: 1 Procedure performed without using ultrasound guided technique. Following insertion, dressing applied and Biopatch. Post procedure assessment: normal  Patient tolerated the procedure well with no immediate complications.

## 2023-07-03 NOTE — H&P (Signed)
[facility] [place]   Patient name: [name] MRN: [id number]        DOB: 01/30/1951          Sex: female     REASON FOR VISIT:      Follow-up   HISOTRY OF PRESENT ILLNESS:    [name] is a 72 y.o. female who is status post left carotid endarterectomy in 2010 for symptomatic stenosis.  She had extensive plaque which extended into her proximal common carotid artery.  I sent her for CT scan for further evaluation of her right-sided carotid disease.  Because of the extensive plaque, I do not think she is a good TCAR candidate.  I felt her best option was going to be a right carotid endarterectomy with retrograde stenting of the ostium if indicated.  She was also going to require intervention of recurrent mid left common carotid artery stenosis via redo carotid endarterectomy.  Because of her persistent cardiac symptoms, she was sent for formal clearance.  She had a cardiac catheterization.  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  She was also turned down by cardiology for high risk PCI.  Medical management has been favored for her coronary disease.  Because she was asymptomatic from her carotid perspective, I elected to monitor her without intervention.  She is back today for follow-up she denies any neurologic symptoms.  She denies numbness or weakness in either extremity.  She denies slurred speech.  She denies emesis features.  She has undergone PCI now on 11/19/2022 for chest pain   She does feel that her dizziness is getting worse   I performed left renal artery stenting for hypertension on 06/15/2012.  She developed a recurrence and underwent angioplasty on 05/26/2017.  The patient is a former smoker.  She takes a statin for hypercholesterolemia.  She has a history of coronary artery disease, status post MI.  She has persistent angina.   PAST MEDICAL HISTORY:        Past Medical History:  Diagnosis  Date   Abnormal chest CT     Anxiety     Arthritis      Rhumatoid and Osteoarthritis   Carotid artery disease (HCC)      Dr. Myra Gianotti, s/p left CEA, severe R ICA stenosis   Coronary atherosclerosis of native coronary artery      DES RCA/circumflex 4/10, LVEF 65 -> repeat cath 11/2021 with multivessel CAD including LM stenosis, initial med rx   Depression     Essential hypertension     Fibromyalgia     GERD (gastroesophageal reflux disease)     Hiatal hernia     Hyperlipidemia     Kidney cysts     NSTEMI (non-ST elevated myocardial infarction) (HCC)      4/10   Renal artery stenosis (HCC)      Left renal artery stent 12/13   Restless leg syndrome     Spondylosis without myelopathy     Tobacco abuse              FAMILY HISTORY:         Family History  Problem Relation Age of Onset   Other Mother          Cerebral hemorrage   Heart disease Mother     Heart attack Mother     Hyperlipidemia Mother     Heart disease Father  Heart attack Father     Hyperlipidemia Father     Heart disease Brother          Heart Disease before age 78   Diabetes Brother     Hypertension Brother     Heart attack Brother     Cancer Sister     Arthritis Sister     Heart disease Sister     Heart disease Daughter 40        Heart Disease before age 77   Heart attack Daughter     Hypertension Daughter     Cancer Sister     Arthritis Sister     Diabetes Brother          Varicose  Veins   Peripheral vascular disease Brother     Diabetes Brother            SOCIAL HISTORY:    Social History         Tobacco Use   Smoking status: Former      Current packs/day: 0.00      Types: Cigarettes      Start date: 02/18/1977      Quit date: 02/18/2022      Years since quitting: 1.1   Smokeless tobacco: Never  Substance Use Topics   Alcohol use: No      Alcohol/week: 0.0 standard drinks of alcohol        ALLERGIES:    Allergies       Allergies  Allergen Reactions   Celecoxib Itching  and Other (See Comments)      Other Reaction(s): Not available   Citalopram Other (See Comments) and Palpitations      Heart rate increased   Morphine And Codeine Other (See Comments) and Itching      Patient states she is not allergic to morphine; states had morphine with no reaction   Ciprofloxacin Hcl Other (See Comments)      Mya have made her sick or bad diarrhea   Dicyclomine Diarrhea and Other (See Comments)      Made diarrhea worse   Lamotrigine Other (See Comments) and Diarrhea      Unknown   Metoclopramide Other (See Comments)      Dizziness   Milnacipran Hcl Other (See Comments)      Unknown   Omeprazole Other (See Comments)   Pregabalin Other (See Comments)   Serotonin Reuptake Inhibitors (Ssris) Other (See Comments)   Simvastatin Other (See Comments)      Increased back and muscle pain   Tizanidine Other (See Comments)      unknown   Trazodone And Nefazodone Other (See Comments)      hallucinations   Venlafaxine Other (See Comments)      Affected eyes, blurred vision   Codeine Itching   Morphine Other (See Comments)   Nefazodone Other (See Comments)   Tape Other (See Comments)      Skin gets red and skin pulls off, paper tape only   Sertraline Hcl Other (See Comments)      Stomach upset. Pt takes this med at home 11/2022          CURRENT MEDICATIONS:          Current Outpatient Medications  Medication Sig Dispense Refill   albuterol (VENTOLIN HFA) 108 (90 Base) MCG/ACT inhaler 1 puff 4 (four) times daily as needed for wheezing or shortness of breath.       ALPRAZolam (XANAX) 0.5 MG tablet Take 0.5 mg by  mouth 3 (three) times daily as needed. (Patient not taking: Reported on 12/13/2022)       amLODipine (NORVASC) 10 MG tablet Take 1 tablet (10 mg total) by mouth daily. 30 tablet 2   aspirin 81 MG EC tablet Take 81 mg by mouth daily.       clopidogrel (PLAVIX) 75 MG tablet Take 1 tablet (75 mg total) by mouth daily. 90 tablet 2   diclofenac Sodium (VOLTAREN) 1  % GEL Apply 2 g topically 3 (three) times daily.       DULoxetine (CYMBALTA) 20 MG capsule Take 20 mg by mouth at bedtime.       EPINEPHrine (EPIPEN JR) 0.15 MG/0.3ML injection Inject 0.15 mg into the muscle as needed for anaphylaxis. (Patient not taking: Reported on 12/13/2022)       isosorbide mononitrate (IMDUR) 60 MG 24 hr tablet Take 1 tablet (60 mg total) by mouth daily. (Patient taking differently: Take 30 mg by mouth daily.) 30 tablet 2   levothyroxine (SYNTHROID) 25 MCG tablet Take 25 mcg by mouth daily before breakfast.       metoprolol succinate (TOPROL-XL) 100 MG 24 hr tablet Take 1 tablet (100 mg total) by mouth daily. Take with or immediately following a meal. (Patient taking differently: Take 50 mg by mouth daily. Take with or immediately following a meal.) 30 tablet 2   nitroGLYCERIN (NITROSTAT) 0.4 MG SL tablet Place 1 tablet (0.4 mg total) under the tongue every 5 (five) minutes as needed for chest pain. 30 tablet 12   ondansetron (ZOFRAN) 4 MG tablet Take 4 mg by mouth 3 (three) times daily as needed for nausea.       oxyCODONE-acetaminophen (PERCOCET/ROXICET) 5-325 MG tablet Take 1 tablet by mouth 3 (three) times daily as needed for moderate pain or severe pain.       pantoprazole (PROTONIX) 40 MG tablet Take 40 mg by mouth daily.       rosuvastatin (CRESTOR) 40 MG tablet TAKE 1 TABLET BY MOUTH AT BEDTIME 30 tablet 0   sertraline (ZOLOFT) 50 MG tablet Take 50 mg by mouth daily.       vitamin B-12 (CYANOCOBALAMIN) 1000 MCG tablet Take 2 tablets (2,000 mcg total) by mouth daily. (Patient taking differently: Take 1,000 mcg by mouth daily.) 60 tablet 0   Vitamin D, Ergocalciferol, (DRISDOL) 50000 UNITS CAPS Take 50,000 Units by mouth every Monday.           No current facility-administered medications for this visit.        REVIEW OF SYSTEMS:    [X]  denotes positive finding, [ ]  denotes negative finding Cardiac   Comments:  Chest pain or chest pressure:      Shortness of breath  upon exertion:      Short of breath when lying flat:      Irregular heart rhythm:             Vascular      Pain in calf, thigh, or hip brought on by ambulation:      Pain in feet at night that wakes you up from your sleep:       Blood clot in your veins:      Leg swelling:              Pulmonary      Oxygen at home:      Productive cough:       Wheezing:  Neurologic      Sudden weakness in arms or legs:       Sudden numbness in arms or legs:       Sudden onset of difficulty speaking or slurred speech:      Temporary loss of vision in one eye:       Problems with dizziness:  x           Gastrointestinal      Blood in stool:       Vomited blood:              Genitourinary      Burning when urinating:       Blood in urine:             Psychiatric      Major depression:              Hematologic      Bleeding problems:      Problems with blood clotting too easily:             Skin      Rashes or ulcers:             Constitutional      Fever or chills:          PHYSICAL EXAM:       Vitals:    04/06/23 0935  BP: (!) 145/66  Pulse: (!) 58  Resp: 18  Temp: 98 F (36.7 C)  TempSrc: Temporal  SpO2: 92%  Weight: 146 lb 12.8 oz (66.6 kg)  Height: 4\' 11"  (1.499 m)      GENERAL: The patient is a well-nourished female, in no acute distress. The vital signs are documented above. CARDIAC: There is a regular rate and rhythm.  PULMONARY: Non-labored respirations ABDOMEN: Soft and non-tender with normal pitched bowel sounds.  MUSCULOSKELETAL: There are no major deformities or cyanosis. NEUROLOGIC: No focal weakness or paresthesias are detected. SKIN: There are no ulcers or rashes noted. PSYCHIATRIC: The patient has a normal affect.   STUDIES:    Carotid duplex is essentially unchanged.  There is flow in both carotid arteries     MEDICAL ISSUES:      ?  Symptomatic carotid stenosis, right greater than left: The patient's biggest complaint is  dizziness.  Her severe carotid occlusive disease could be contributing.  Previously, she was felt to be too high risk for carotid surgery because of her severe coronary disease.  At that time, there was no plans for coronary intervention because of the high risk nature.  She developed chest pain over the summer and underwent PCI.  I am sending her back to her cardiologist to determine whether or not we can proceed with carotid endarterectomy.  I would begin on the right side.  She will likely need retrograde carotid artery stenting of the ostium of her carotid artery.  This will be done to hopefully improve her dizziness symptoms.  I discussed the risks and benefits of the procedure with the patient including the risk of stroke and nerve injury.  All questions were answered.  I will not stop her dual antiplatelet therapy (aspirin and Plavix)   Durene Cal, IV, MD, FACS [facility] [phone] Pager [phone]     No interval changes, however she has quit smoking.  No new neurologic changes PE:  RRR CTAB Neuro intact Plan for right CEA and possible retrograde stent  Durene Cal

## 2023-07-03 NOTE — Anesthesia Postprocedure Evaluation (Signed)
Anesthesia Post Note  Patient: [name]  Procedure(s) Performed: ENDARTERECTOMY CAROTID WITH XENOSURE BIOLOGIC PATCH ANGIOPLASTY (Right) Right common carotid angiogram (Right)     Patient location during evaluation: PACU Anesthesia Type: General Level of consciousness: awake and alert Pain management: pain level controlled Vital Signs Assessment: post-procedure vital signs reviewed and stable Respiratory status: spontaneous breathing, nonlabored ventilation, respiratory function stable and patient connected to nasal cannula oxygen Cardiovascular status: blood pressure returned to baseline and stable Postop Assessment: no apparent nausea or vomiting Anesthetic complications: no  No notable events documented.  Last Vitals:  Vitals:   07/03/23 1420 07/03/23 1425  BP:    Pulse:  73  Resp:  13  Temp: 37.1 C   SpO2:  98%    Last Pain:  Vitals:   07/03/23 1420  TempSrc: Oral  PainSc:                  Kennieth Rad

## 2023-07-03 NOTE — Op Note (Signed)
Patient name: [name] MRN: [id number] DOB: Jan 28, 1951 Sex: female  07/03/2023 Pre-operative Diagnosis: ? symptomatic   right carotid stenosis Post-operative diagnosis:  Same Surgeon:  Durene Cal Assistants:  Adonis Housekeeper, PA Procedure:    #1:right carotid Endarterectomy with bovine pericardial patch angioplasty   #2: Right carotid angiogram Anesthesia:  General Blood Loss:  750 Specimens: None Findings:  90 %stenosis; Thrombus:  none  Indications: This is a 72 year old female with bilateral high-grade carotid stenosis, right greater than left.  She is having issues with dizziness.  She was initially felt to be too high risk for surgery because she had severe coronary disease.  Over the summer, this was treated when she presented with a MI and she has now been cleared for endarterectomy.  Her disease extends into the intrathoracic carotid and innominate artery.  She comes in today for endarterectomy and possible stenting.  Procedure:  The patient was identified in the holding area and taken to [facility] OR ROOM 16  The patient was then placed supine on the table.   General endotrachial anesthesia was administered.  The patient was prepped and draped in the usual sterile fashion.  A time out was called and antibiotics were administered.  A PA was necessary to expedite the procedure and assist with technical details.  She help with exposure by providing suction and retraction.  She help with the anastomosis by following the suture.  The incision was made along the anterior border of the right sternocleidomastoid muscle.  Cautery was used to dissect through the subcutaneous tissue.  The platysma muscle was divided with cautery.  The internal jugular vein was exposed along its anterior medial border.  The common facial vein was exposed and then divided between 2-0 silk ties and metal clips.  The common carotid artery was then circumferentially exposed and encircled with an umbilical tape.  The vagus  nerve was identified and protected.  The exposure of the common carotid artery went into the chest down to its origin as the artery was heavily calcified and I was concerned about the ability to clamp it.  Next sharp dissection was used to expose the external carotid artery and the superior thyroid artery.  The were encircled with a blue vessel loop and a 2-0 silk tie respectively.  Finally, the internal carotid was carefully dissected free.  An umbilical tape was placed around the internal carotid artery distal to the diseased segment.  The hypoglossal nerve was visualized throughout and protected.  The patient was given systemic heparinization.  A bovine carotid patch was selected and prepared on the back table.  A 10 french shunt was also prepared.  After blood pressure readings were appropriate and the heparin had been given time to circulate, the internal carotid artery was occluded with a baby Gregory clamp.  The external and common carotid arteries were then occluded with vascular clamps and the 2-0 tie tightened on the superior thyroid artery.  A #11 blade was used to make an arteriotomy in the common carotid artery.  This was extended with Potts scissors along the anterior and lateral border of the common and internal carotid artery.  I opened the common carotid artery proximally down to its origin.  There was nearly occlusive heavily calcified plaque throughout.  Approximately 90% stenosis was identified.  There was no thrombus identified.  The 10 french shunt was not placed, as there was excellent backbleeding.  A kleiner kuntz elevator was used to perform endarterectomy.  An eversion  endarterectomy was performed in the external carotid artery.  A good distal endpoint was obtained in the internal carotid artery.  I tacked down the distal flap with 7-0 Prolene.  The specimen was removed and sent to pathology.  Heparinized saline was used to irrigate the endarterectomized field.  All potential embolic  debris was removed.  Because of the length of the arteriotomy, I elected to close the common carotid artery primarily.  A bovine pericardial patch angioplasty was then performed using a running 6-0 Prolene. The common internal and external carotid arteries were all appropriately flushed. The artery was again irrigated with heparin saline.  The anastomosis was then secured. The clamp was first released on the external carotid artery followed by the common carotid artery approximately 30 seconds later, bloodflow was reestablish through the internal carotid artery.  Next, a hand-held  Doppler was used to evaluate the signals in the common, external, and internal  carotid arteries, all of which had appropriate signals.  Several repair stitches were required for hemostasis.  I then cannulated the bovine patch with a micropuncture needle and passed a micropuncture wire proximally followed by micropuncture sheath.  Carotid angiogram was then performed which showed the origin of the common carotid artery which was without significant stenosis.  There was disease in the innominate artery.  No stenosis was greater than 60%.  I elected not to place a stent.  The micropuncture sheath was removed and the arteriotomy closed with a 6-0 Prolene suture.  I there was bleeding from the posterior aspect of the proximal carotid artery likely from clamp placement.  With significant difficulty I was able to repair this with a pledgeted 5-0 Prolene.  This is where the blood loss occurred.  I then administered  50 mg protamine. The wound was then irrigated.  After hemostasis was achieved, the carotid sheath was reapproximated with 3-0 Vicryl. The  platysma muscle was reapproximated with running 3-0 Vicryl. The skin  was closed with 4-0 Vicryl. Dermabond was placed on the skin. The  patient was then successfully extubated. Her neurologic exam was  similar to his preprocedural exam. The patient was then taken to recovery room  in  stable condition. There were no complications.     Disposition:  To PACU in stable condition.  Relevant Operative Details: A good distal endpoint was obtained in the internal carotid artery.  I tacked this down with 7-0 Prolene.  There was heavily calcified circumferential nearly occlusive plaque that went down to the carotid origin.  I was able to get exposure of the carotid artery at its origin.  The subclavian artery was also visualized.  Endarterectomy was able to remove the calcified plaque in the common carotid artery.  I closed the common carotid artery primarily and then used a bovine patch on the distal common carotid artery and proximal internal carotid artery.  I did not use a shunt because there was good backbleeding.  The vagus nerve and hypoglossal nerves were fully visualized and protected.  I did lose some blood trying to repair a hole in the posterior aspect of the very proximal common carotid artery.  I performed angiography to evaluate the proximal disease.  I did not see any area that was greater than 60% and so stenting was not necessary.  The patient awoke neurologically intact  V. Durene Cal, M.D., [facility] [place] Office: [phone] Pager:  [phone]

## 2023-07-03 NOTE — Anesthesia Procedure Notes (Signed)
Procedure Name: Intubation Date/Time: 07/03/2023 7:55 AM  Performed by: Darryl Nestle, CRNAPre-anesthesia Checklist: Patient identified, Emergency Drugs available, Suction available and Patient being monitored Patient Re-evaluated:Patient Re-evaluated prior to induction Oxygen Delivery Method: Circle system utilized Preoxygenation: Pre-oxygenation with 100% oxygen Induction Type: IV induction Ventilation: Mask ventilation without difficulty and Oral airway inserted - appropriate to patient size Laryngoscope Size: Hyacinth Meeker and 2 Grade View: Grade I Tube type: Oral Tube size: 7.0 mm Number of attempts: 1 Airway Equipment and Method: Stylet and Oral airway Placement Confirmation: ETT inserted through vocal cords under direct vision, positive ETCO2 and breath sounds checked- equal and bilateral Secured at: 21 cm Tube secured with: Tape Dental Injury: Teeth and Oropharynx as per pre-operative assessment

## 2023-07-03 NOTE — Discharge Instructions (Signed)
   [facility] [place]  Discharge Instructions   Carotid Surgery  Please refer to the following instructions for your post-procedure care. Your surgeon or physician assistant will discuss any changes with you.  Activity  You are encouraged to walk as much as you can. You can slowly return to normal activities but must avoid strenuous activity and heavy lifting until your doctor tell you it's okay. Avoid activities such as vacuuming or swinging a golf club. You can drive after one week if you are comfortable and you are no longer taking prescription pain medications. It is normal to feel tired for serval weeks after your surgery. It is also normal to have difficulty with sleep habits, eating, and bowel movements after surgery. These will go away with time.  Bathing/Showering  Shower daily after you go home. Do not soak in a bathtub, hot tub, or swim until the incision heals completely.  Incision Care  Shower every day. Clean your incision with mild soap and water. Pat the area dry with a clean towel. You do not need a bandage unless otherwise instructed. Do not apply any ointments or creams to your incision. You may have skin glue on your incision. Do not peel it off. It will come off on its own in about one week. Your incision may feel thickened and raised for several weeks after your surgery. This is normal and the skin will soften over time.   For Men Only: It's okay to shave around the incision but do not shave the incision itself for 2 weeks. It is common to have numbness under your chin that could last for several months.  Diet  Resume your normal diet. There are no special food restrictions following this procedure. A low fat/low cholesterol diet is recommended for all patients with vascular disease. In order to heal from your surgery, it is CRITICAL to get adequate nutrition. Your body requires vitamins, minerals, and protein. Vegetables are the best source of  vitamins and minerals. Vegetables also provide the perfect balance of protein. Processed food has little nutritional value, so try to avoid this.  Medications  Resume taking all of your medications unless your doctor or physician assistant tells you not to. If your incision is causing pain, you may take over-the- counter pain relievers such as acetaminophen (Tylenol). If you were prescribed a stronger pain medication, please be aware these medications can cause nausea and constipation. Prevent nausea by taking the medication with a snack or meal. Avoid constipation by drinking plenty of fluids and eating foods with a high amount of fiber, such as fruits, vegetables, and grains.   Do not take Tylenol if you are taking prescription pain medications.  Follow Up  Our office will schedule a follow up appointment 2-3 weeks following discharge.  Please call us immediately for any of the following conditions  . Increased pain, redness, drainage (pus) from your incision site. . Fever of 101 degrees or higher. . If you should develop stroke (slurred speech, difficulty swallowing, weakness on one side of your body, loss of vision) you should call 911 and go to the nearest emergency room. .  Reduce your risk of vascular disease:  . Stop smoking. If you would like help call [facility] at 1-800-QUIT-NOW (1-800-784-8669) or [facility] at 336-586-4000. . Manage your cholesterol . Maintain a desired weight . Control your diabetes . Keep your blood pressure down .  If you have any questions, please call the office at [phone]. 

## 2023-07-04 LAB — POCT I-STAT 7, (LYTES, BLD GAS, ICA,H+H)
Acid-base deficit: 5 mmol/L — ABNORMAL HIGH (ref 0.0–2.0)
Acid-base deficit: 6 mmol/L — ABNORMAL HIGH (ref 0.0–2.0)
Acid-base deficit: 8 mmol/L — ABNORMAL HIGH (ref 0.0–2.0)
Bicarbonate: 20.3 mmol/L (ref 20.0–28.0)
Bicarbonate: 19.1 mmol/L — ABNORMAL LOW (ref 20.0–28.0)
Bicarbonate: 20.9 mmol/L (ref 20.0–28.0)
Calcium, Ion: 1.12 mmol/L — ABNORMAL LOW (ref 1.15–1.40)
Calcium, Ion: 1.21 mmol/L (ref 1.15–1.40)
Calcium, Ion: 1.28 mmol/L (ref 1.15–1.40)
HCT: 27 % — ABNORMAL LOW (ref 36.0–46.0)
HCT: 24 % — ABNORMAL LOW (ref 36.0–46.0)
HCT: 25 % — ABNORMAL LOW (ref 36.0–46.0)
Hemoglobin: 9.2 g/dL — ABNORMAL LOW (ref 12.0–15.0)
Hemoglobin: 8.2 g/dL — ABNORMAL LOW (ref 12.0–15.0)
Hemoglobin: 8.5 g/dL — ABNORMAL LOW (ref 12.0–15.0)
O2 Saturation: 100 %
O2 Saturation: 95 %
O2 Saturation: 98 %
Patient temperature: 37
Patient temperature: 98
Patient temperature: 98
Potassium: 4.3 mmol/L (ref 3.5–5.1)
Potassium: 4.4 mmol/L (ref 3.5–5.1)
Potassium: >8.5 mmol/L (ref 3.5–5.1)
Sodium: 140 mmol/L (ref 135–145)
Sodium: 136 mmol/L (ref 135–145)
Sodium: 141 mmol/L (ref 135–145)
TCO2: 21 mmol/L — ABNORMAL LOW (ref 22–32)
TCO2: 20 mmol/L — ABNORMAL LOW (ref 22–32)
TCO2: 22 mmol/L (ref 22–32)
pCO2 arterial: 35.6 mm[Hg] (ref 32–48)
pCO2 arterial: 44.8 mm[Hg] (ref 32–48)
pCO2 arterial: 46.1 mm[Hg] (ref 32–48)
pH, Arterial: 7.363 (ref 7.35–7.45)
pH, Arterial: 7.223 — ABNORMAL LOW (ref 7.35–7.45)
pH, Arterial: 7.274 — ABNORMAL LOW (ref 7.35–7.45)
pO2, Arterial: 183 mm[Hg] — ABNORMAL HIGH (ref 83–108)
pO2, Arterial: 129 mm[Hg] — ABNORMAL HIGH (ref 83–108)
pO2, Arterial: 86 mm[Hg] (ref 83–108)

## 2023-07-04 LAB — BPAM RBC
Blood Product Expiration Date: 202501022359
Blood Product Expiration Date: 202501022359
Blood Product Expiration Date: 202501072359
ISSUE DATE / TIME: 202412201022
ISSUE DATE / TIME: 202412201022
ISSUE DATE / TIME: 202412201346
Unit Type and Rh: 600
Unit Type and Rh: 600
Unit Type and Rh: 600

## 2023-07-04 LAB — LIPID PANEL
Cholesterol: 106 mg/dL (ref 0–200)
HDL: 46 mg/dL (ref >40)
LDL Cholesterol: 47 mg/dL (ref 0–99)
Total CHOL/HDL Ratio: 2.3 {ratio}
Triglycerides: 67 mg/dL (ref <150)
VLDL: 13 mg/dL (ref 0–40)

## 2023-07-04 LAB — TYPE AND SCREEN
ABO/RH(D): A NEG
Antibody Screen: NEGATIVE
Unit division: 0
Unit division: 0
Unit division: 0

## 2023-07-04 LAB — CBC
HCT: 34.4 % — ABNORMAL LOW (ref 36.0–46.0)
Hemoglobin: 12 g/dL (ref 12.0–15.0)
MCH: 30.9 pg (ref 26.0–34.0)
MCHC: 34.9 g/dL (ref 30.0–36.0)
MCV: 88.7 fL (ref 80.0–100.0)
Platelets: 118 10*3/uL — ABNORMAL LOW (ref 150–400)
RBC: 3.88 MIL/uL (ref 3.87–5.11)
RDW: 15.9 % — ABNORMAL HIGH (ref 11.5–15.5)
WBC: 12 10*3/uL — ABNORMAL HIGH (ref 4.0–10.5)
nRBC: 0 % (ref 0.0–0.2)

## 2023-07-04 LAB — POCT ACTIVATED CLOTTING TIME
Activated Clotting Time: 297 s
Activated Clotting Time: 227 s
Activated Clotting Time: 187 s
Activated Clotting Time: 227 s

## 2023-07-04 LAB — BASIC METABOLIC PANEL
Anion gap: 11 (ref 5–15)
BUN: 20 mg/dL (ref 8–23)
CO2: 22 mmol/L (ref 22–32)
Calcium: 9.1 mg/dL (ref 8.9–10.3)
Chloride: 107 mmol/L (ref 98–111)
Creatinine, Ser: 1.24 mg/dL — ABNORMAL HIGH (ref 0.44–1.00)
GFR, Estimated: 46 mL/min — ABNORMAL LOW (ref >60)
Glucose, Bld: 124 mg/dL — ABNORMAL HIGH (ref 70–99)
Potassium: 4.4 mmol/L (ref 3.5–5.1)
Sodium: 140 mmol/L (ref 135–145)

## 2023-07-04 NOTE — Progress Notes (Signed)
Paged Dr. Myra Gianotti about continuing dopamine gtt.   Per Dr. Myra Gianotti, continue dopamine gtt til morning.

## 2023-07-04 NOTE — Progress Notes (Signed)
PHARMACIST LIPID MONITORING   [name] is a 72 y.o. female admitted on 07/03/2023 for endarterectomy and possible stenting.  Pharmacy has been consulted to optimize lipid-lowering therapy with the indication of secondary prevention for clinical ASCVD.  Recent Labs:  Lipid Panel (last 6 months):   Lab Results  Component Value Date   CHOL 106 07/04/2023   TRIG 67 07/04/2023   HDL 46 07/04/2023   CHOLHDL 2.3 07/04/2023   VLDL 13 07/04/2023   LDLCALC 47 07/04/2023    Hepatic function panel (last 6 months):   Lab Results  Component Value Date   AST 17 07/02/2023   ALT 12 07/02/2023   ALKPHOS 55 07/02/2023   BILITOT 0.5 07/02/2023    SCr (since admission):   Serum creatinine: 1.24 mg/dL (H) 16/10/96 0454 Estimated creatinine clearance: 32.9 mL/min (A)  Current therapy and lipid therapy tolerance Current lipid-lowering therapy: rosuvastatin 40 mg daily Previous lipid-lowering therapies (if applicable): simvastatin Documented or reported allergies or intolerances to lipid-lowering therapies (if applicable): increased back and muscle pain with simvastatin  Assessment:   LDL 47, at goal of < 55 mg/dL. No need for changes in lipid-lowering therapy at this time.  Plan:    1.Statin intensity (high intensity recommended for all patients regardless of the LDL):  No statin changes. The patient is already on a high intensity statin.  2.Add ezetimibe (if any one of the following):   Not indicated at this time.  3.Refer to lipid clinic:   No  4.Follow-up with:  Primary care provider - Ignatius Specking, MD  5.Follow-up labs after discharge:  No changes in lipid therapy, repeat a lipid panel in one year.     Jenita Seashore, PharmD 07/04/2023, 6:59 AM

## 2023-07-04 NOTE — Plan of Care (Signed)
  Problem: Clinical Measurements: Goal: Will remain free from infection Outcome: Progressing   Problem: Nutrition: Goal: Adequate nutrition will be maintained Outcome: Progressing   

## 2023-07-04 NOTE — Plan of Care (Signed)
  Problem: Clinical Measurements: Goal: Will remain free from infection Outcome: Progressing   Problem: Activity: Goal: Risk for activity intolerance will decrease Outcome: Progressing   

## 2023-07-04 NOTE — Progress Notes (Addendum)
  Progress Note    07/04/2023 9:37 AM 1 Day Post-Op  Subjective:  no complaints   Vitals:   07/04/23 0300 07/04/23 0734  BP: (!) 125/55 (!) 139/46  Pulse: 78 75  Resp: 18 (!) 21  Temp: 99.4 F (37.4 C) 98.8 F (37.1 C)  SpO2: 94% 92%   Physical Exam: Lungs:  non labored Incisions:  R neck incision c/d/i Extremities:  moving all ext well Neurologic: CN grossly intact  CBC    Component Value Date/Time   WBC 12.0 (H) 07/04/2023 0330   RBC 3.88 07/04/2023 0330   HGB 12.0 07/04/2023 0330   HGB 11.9 02/25/2022 1451   HCT 34.4 (L) 07/04/2023 0330   HCT 36.1 02/25/2022 1451   PLT 118 (L) 07/04/2023 0330   PLT 175 02/25/2022 1451   MCV 88.7 07/04/2023 0330   MCV 98 (H) 02/25/2022 1451   MCH 30.9 07/04/2023 0330   MCHC 34.9 07/04/2023 0330   RDW 15.9 (H) 07/04/2023 0330   RDW 12.8 02/25/2022 1451   LYMPHSABS 1.8 06/09/2023 2139   LYMPHSABS 1.9 02/25/2022 1451   MONOABS 0.6 06/09/2023 2139   EOSABS 0.1 06/09/2023 2139   EOSABS 0.2 02/25/2022 1451   BASOSABS 0.0 06/09/2023 2139   BASOSABS 0.0 02/25/2022 1451    BMET    Component Value Date/Time   NA 140 07/04/2023 0330   K 4.4 07/04/2023 0330   CL 107 07/04/2023 0330   CO2 22 07/04/2023 0330   GLUCOSE 124 (H) 07/04/2023 0330   BUN 20 07/04/2023 0330   CREATININE 1.24 (H) 07/04/2023 0330   CALCIUM 9.1 07/04/2023 0330   GFRNONAA 46 (L) 07/04/2023 0330   GFRAA NOT CALCULATED 06/09/2023 2139    INR    Component Value Date/Time   INR 1.0 07/02/2023 1007     Intake/Output Summary (Last 24 hours) at 07/04/2023 5784 Last data filed at 07/04/2023 6962 Gross per 24 hour  Intake 4327.44 ml  Output 1675 ml  Net 2652.44 ml     Assessment/Plan:  72 y.o. female is s/p R CEA 1 Day Post-Op   Neuro exam remains at baseline; no events overnight R neck incision with some fullness but no firm hematoma; continue JP drain Hypotensive post operatively; wean dopamine today OOB with mobility team Home possibly  tomorrow   Emilie Rutter, PA-C [facility] [phone] 07/04/2023 9:37 AM  I agree with the above.  I seen and evaluate the patient.  She is postoperative day #1 from right carotid endarterectomy.  She has required 4 mics of dopamine overnight for hypotension.  Her blood pressure is better this morning.  She is neurologically intact.  JP is in place with minimal output.  I think that she needs 1 more day to recover given her prolonged surgery and blood transfusion yesterday for acute blood loss anemia.  We will try to wean her dopamine today and take her drain out tomorrow  Durene Cal

## 2023-07-05 ENCOUNTER — Encounter (HOSPITAL_COMMUNITY): Payer: Self-pay | Admitting: Surgery

## 2023-07-05 MED ORDER — OXYCODONE-ACETAMINOPHEN 5-325 MG PO TABS: 1.0000 | ORAL_TABLET | Freq: Four times a day (QID) | ORAL | 0 refills | Status: DC | PRN

## 2023-07-05 MED ORDER — OXYCODONE-ACETAMINOPHEN 5-325 MG PO TABS
1.0000 | ORAL_TABLET | Freq: Four times a day (QID) | ORAL | Status: DC | PRN
  Administered 2023-07-05: 1 via ORAL
  Filled 2023-07-05: qty 1

## 2023-07-05 NOTE — TOC Transition Note (Signed)
Transition of Care [facility]) - Discharge Note   Patient Details  Name: [name] MRN: [id number] Date of Birth: 1951-03-10  [facility] CM/SW Contact:  Ronny Bacon, RN Phone Number: 07/05/2023, 10:09 AM   Clinical Narrative:  Patient is being discharged today. Patient noted to be on oxygen here at the hospital. Spoke with daughter, reports patient is not on home oxygen at this time. Discharging provider made aware. No new orders noted.      Final next level of care: Home/Self Care Barriers to Discharge: No Barriers Identified   Patient Goals and CMS Choice            Discharge Placement                       Discharge Plan and Services Additional resources added to the After Visit Summary for                                       Social Drivers of Health (SDOH) Interventions SDOH Screenings   Food Insecurity: Patient Declined (07/04/2023)  Housing: Patient Declined (07/04/2023)  Transportation Needs: Patient Declined (07/04/2023)  Utilities: Patient Declined (07/04/2023)  Depression (PHQ2-9): Low Risk  (03/11/2020)  Financial Resource Strain: Low Risk  (03/09/2023)   Received from [facility]  Social Connections: Unknown (05/13/2022)   Received from [facility]  Tobacco Use: Medium Risk (07/03/2023)     Readmission Risk Interventions     No data to display

## 2023-07-05 NOTE — Progress Notes (Signed)
  Progress Note    07/05/2023 9:26 AM 2 Days Post-Op  Subjective:  feeling better.  Ready to go home   Vitals:   07/05/23 0408 07/05/23 0857  BP: 126/66 (!) 146/54  Pulse: 76 74  Resp: 17 14  Temp: 98.8 F (37.1 C) 97.6 F (36.4 C)  SpO2: 94% 92%   Physical Exam: Lungs:  non labored Incisions:  R neck with fullness but no firm hematoma Extremities:  moving all ext well Neurologic: CN grossly intact  CBC    Component Value Date/Time   WBC 12.0 (H) 07/04/2023 0330   RBC 3.88 07/04/2023 0330   HGB 12.0 07/04/2023 0330   HGB 11.9 02/25/2022 1451   HCT 34.4 (L) 07/04/2023 0330   HCT 36.1 02/25/2022 1451   PLT 118 (L) 07/04/2023 0330   PLT 175 02/25/2022 1451   MCV 88.7 07/04/2023 0330   MCV 98 (H) 02/25/2022 1451   MCH 30.9 07/04/2023 0330   MCHC 34.9 07/04/2023 0330   RDW 15.9 (H) 07/04/2023 0330   RDW 12.8 02/25/2022 1451   LYMPHSABS 1.8 06/09/2023 2139   LYMPHSABS 1.9 02/25/2022 1451   MONOABS 0.6 06/09/2023 2139   EOSABS 0.1 06/09/2023 2139   EOSABS 0.2 02/25/2022 1451   BASOSABS 0.0 06/09/2023 2139   BASOSABS 0.0 02/25/2022 1451    BMET    Component Value Date/Time   NA 140 07/04/2023 0330   K 4.4 07/04/2023 0330   CL 107 07/04/2023 0330   CO2 22 07/04/2023 0330   GLUCOSE 124 (H) 07/04/2023 0330   BUN 20 07/04/2023 0330   CREATININE 1.24 (H) 07/04/2023 0330   CALCIUM 9.1 07/04/2023 0330   GFRNONAA 46 (L) 07/04/2023 0330   GFRAA NOT CALCULATED 06/09/2023 2139    INR    Component Value Date/Time   INR 1.0 07/02/2023 1007     Intake/Output Summary (Last 24 hours) at 07/05/2023 6962 Last data filed at 07/05/2023 0241 Gross per 24 hour  Intake 0 ml  Output 1040 ml  Net -1040 ml     Assessment/Plan:  72 y.o. female is s/p R CEA 2 Days Post-Op   Without neuro events overnight R neck incision with fullness but no hematoma; stable over past 24h; ok to d/c JP drain Dopamine weaned; normotensive D/c home today; follow up for incision check  in 2-3 weeks     Heather Rutter, PA-C [facility] [phone] 07/05/2023 9:26 AM

## 2023-07-05 NOTE — Progress Notes (Signed)
Attempted to wean patient's oxygen down. Placed her on 1L O2 but sats dropped to 88-89. Put patient back p to 2L O2. Encouraged IS

## 2023-07-05 NOTE — Progress Notes (Addendum)
Jp drain removed per order no s/s of bleeding observed

## 2023-07-06 ENCOUNTER — Ambulatory Visit: Payer: 59 | Admitting: Surgery

## 2023-07-06 ENCOUNTER — Encounter (HOSPITAL_COMMUNITY): Payer: 59

## 2023-07-06 LAB — POCT ACTIVATED CLOTTING TIME
Activated Clotting Time: 239 s
Activated Clotting Time: 124 s

## 2023-07-06 NOTE — Discharge Summary (Addendum)
Discharge Summary     [name] 1951/05/01 72 y.o. female  [id number]  Admission Date: 07/03/2023  Discharge Date: 07/05/23  Physician: Dr. Myra Gianotti  Admission Diagnosis: Status post carotid endarterectomy [Z98.890] Symptomatic carotid artery stenosis, right [I65.21]  Discharge Day services:   See progress note 07/05/2023  Hospital Course:  Ms. [name] is a 72 year old female who was brought in as an outpatient and underwent right carotid endarterectomy with bovine patch angioplasty by Dr. Myra Gianotti on 07/03/2023.  This was performed due to symptomatic right ICA and common carotid stenosis.  The patient tolerated the procedure well and was admitted to the hospital postoperatively.  Overnight and POD #1 she required dopamine due to hypotension.  This was successfully weaned over the course of the morning.  Her neuro exam remained at baseline throughout her hospital stay and she did not experience any neurological events.  Postoperative day #2 JP drain was removed successfully.  She will follow-up in office in about 2 to 3 weeks for incision check.  She was prescribed 2 to 3 days of narcotic pain medication for continued postoperative pain control.  She will continue her aspirin, Plavix, statin daily.  She was discharged home in stable condition with her daughter.  It should also be noted that the patient experienced headaches after surgery.  His headaches were controlled with Tylenol and improved by the time of discharge.  She still had a slight headache however felt ready for discharge home despite being offered to stay an additional day.  Patient's daughter also agreed with readiness for discharge home.   Recent Labs    07/03/23 1245 07/04/23 0330  NA 141 140  K 4.4 4.4  CL  --  107  CO2  --  22  GLUCOSE  --  124*  BUN  --  20  CALCIUM  --  9.1   Recent Labs    07/03/23 1245 07/04/23 0330  WBC  --  12.0*  HGB 8.5* 12.0  HCT 25.0* 34.4*  PLT  --  118*   No  results for input(s): "INR" in the last 72 hours.     Discharge Diagnosis:  Status post carotid endarterectomy [Z98.890] Symptomatic carotid artery stenosis, right [I65.21]  Secondary Diagnosis: Patient Active Problem List   Diagnosis Date Noted   Status post carotid endarterectomy 07/03/2023   Symptomatic carotid artery stenosis, right 07/03/2023   Anxiety state 06/10/2023   Relational problem 06/10/2023   Acute coronary syndrome with high troponin (HCC) 11/20/2022   Chronic kidney disease stage IIIb 11/18/2022   Obesity (BMI 30-39.9) 11/18/2022   Rheumatoid arthritis (HCC) 11/17/2022   Fibromyalgia 11/17/2022   Hypothyroidism 11/17/2022   Progressive angina with severe left main stenosis 11/16/2022   IPF (idiopathic pulmonary fibrosis) (HCC) 02/25/2022   Abdominal pain, epigastric 11/26/2021   Nausea without vomiting 11/26/2021   Constipation 11/26/2021   Tobacco abuse 11/16/2021   Abnormal chest CT 11/16/2021   Abnormal nuclear cardiac imaging test    Mixed incontinence 07/16/2021   History of UTI 07/16/2021   AMS (altered mental status) 03/11/2020   AKI (acute kidney injury) (HCC) 03/11/2020   Hypokalemia 03/11/2020   Altered mental status 03/11/2020   Depression    Prolonged QT interval    Unilateral primary osteoarthritis, right knee 02/22/2020   Gastroesophageal reflux disease without esophagitis 01/25/2018   Encounter for colonoscopy due to history of adenomatous colonic polyps 03/23/2017   Melena 01/31/2014   Aftercare following surgery of the circulatory system, NEC 01/30/2014  Tooth pain 05/03/2013   GERD (gastroesophageal reflux disease) 02/16/2013   Bloating 02/16/2013   Renal artery stenosis (HCC) 11/04/2012   PAD (peripheral artery disease) (HCC) 05/07/2012   Shortness of breath 04/14/2012   Carotid artery stenosis 11/13/2009   Hyperlipidemia 04/19/2009   Essential hypertension 03/12/2009   CAD (coronary artery disease) 03/12/2009   Past Medical  History:  Diagnosis Date   Abnormal chest CT    Anxiety    Arthritis    Rhumatoid and Osteoarthritis   Carotid artery disease (HCC)    Dr. Myra Gianotti, s/p left CEA, severe R ICA stenosis   Coronary atherosclerosis of native coronary artery    DES RCA/circumflex 4/10, LVEF 65 -> repeat cath 11/2021 with multivessel CAD including LM stenosis, initial med rx   Depression    Essential hypertension    Fibromyalgia    GERD (gastroesophageal reflux disease)    Hiatal hernia    Hyperlipidemia    Kidney cysts    NSTEMI (non-ST elevated myocardial infarction) (HCC)    4/10   Renal artery stenosis (HCC)    Left renal artery stent 12/13   Restless leg syndrome    Sleep apnea    Spondylosis without myelopathy    Tobacco abuse     Allergies as of 07/05/2023       Reactions   Celecoxib Itching, Other (See Comments)   Other Reaction(s): Not available   Citalopram Other (See Comments), Palpitations   Heart rate increased   Morphine And Codeine Other (See Comments), Itching   Patient states she is not allergic to morphine; states had morphine with no reaction   Ciprofloxacin Hcl Other (See Comments)   Mya have made her sick or bad diarrhea   Dicyclomine Diarrhea, Other (See Comments)   Made diarrhea worse   Lamotrigine Other (See Comments), Diarrhea   Unknown   Metoclopramide Other (See Comments)   Dizziness   Milnacipran Hcl Other (See Comments)   Unknown   Omeprazole Other (See Comments)   Pregabalin Other (See Comments)   Serotonin Reuptake Inhibitors (ssris) Other (See Comments)   Simvastatin Other (See Comments)   Increased back and muscle pain   Tizanidine Other (See Comments)   unknown   Trazodone And Nefazodone Other (See Comments)   hallucinations   Venlafaxine Other (See Comments)   Affected eyes, blurred vision   Codeine Itching   Nefazodone Other (See Comments)   Tape Other (See Comments)   Skin gets red and skin pulls off, paper tape only   Sertraline Hcl Other  (See Comments)   Stomach upset. Pt takes this med at home 11/2022        Medication List     TAKE these medications    albuterol 108 (90 Base) MCG/ACT inhaler Commonly known as: VENTOLIN HFA 1 puff 4 (four) times daily as needed for wheezing or shortness of breath.   ALPRAZolam 0.5 MG tablet Commonly known as: XANAX Take 0.5 mg by mouth 3 (three) times daily as needed.   amLODipine 10 MG tablet Commonly known as: NORVASC Take 1 tablet (10 mg total) by mouth daily.   aspirin EC 81 MG tablet Take 81 mg by mouth daily.   clopidogrel 75 MG tablet Commonly known as: PLAVIX Take 1 tablet (75 mg total) by mouth daily.   cyanocobalamin 1000 MCG tablet Commonly known as: VITAMIN B12 Take 2 tablets (2,000 mcg total) by mouth daily. What changed: how much to take   diclofenac Sodium 1 % Gel Commonly known  as: VOLTAREN Apply 2 g topically 3 (three) times daily.   diphenhydrAMINE 25 MG tablet Commonly known as: BENADRYL Take 25 mg by mouth every 6 (six) hours as needed for allergies.   DULoxetine 20 MG capsule Commonly known as: CYMBALTA Take 20 mg by mouth at bedtime.   EPINEPHrine 0.15 MG/0.3ML injection Commonly known as: EPIPEN JR Inject 0.15 mg into the muscle as needed for anaphylaxis.   isosorbide mononitrate 60 MG 24 hr tablet Commonly known as: IMDUR Take 1 tablet (60 mg total) by mouth daily.   isosorbide mononitrate 30 MG 24 hr tablet Commonly known as: IMDUR Take 60 mg by mouth daily.   levothyroxine 25 MCG tablet Commonly known as: SYNTHROID Take 25 mcg by mouth daily before breakfast.   metoprolol succinate 100 MG 24 hr tablet Commonly known as: TOPROL-XL Take 1 tablet (100 mg total) by mouth daily. Take with or immediately following a meal.   nitroGLYCERIN 0.4 MG SL tablet Commonly known as: NITROSTAT Place 1 tablet (0.4 mg total) under the tongue every 5 (five) minutes x 3 doses as needed for chest pain (if no relief after 2nd dose, proceed to ED  or call 911).   ondansetron 4 MG tablet Commonly known as: ZOFRAN Take 4 mg by mouth 3 (three) times daily as needed for nausea.   oxyCODONE-acetaminophen 5-325 MG tablet Commonly known as: Percocet Take 1 tablet by mouth every 6 (six) hours as needed for severe pain (pain score 7-10).   pantoprazole 40 MG tablet Commonly known as: PROTONIX Take 40 mg by mouth daily.   rosuvastatin 40 MG tablet Commonly known as: CRESTOR Take 1 tablet (40 mg total) by mouth daily. What changed: when to take this   sertraline 50 MG tablet Commonly known as: ZOLOFT Take 50 mg by mouth daily.   traZODone 50 MG tablet Commonly known as: DESYREL Take 50 mg by mouth at bedtime.   Vitamin D (Ergocalciferol) 1.25 MG (50000 UNIT) Caps capsule Commonly known as: DRISDOL Take 50,000 Units by mouth every Monday.         Discharge Instructions:   [facility] Discharge Instructions Carotid Endarterectomy (CEA)  Please refer to the following instructions for your post-procedure care. Your surgeon or physician assistant will discuss any changes with you.  Activity  You are encouraged to walk as much as you can. You can slowly return to normal activities but must avoid strenuous activity and heavy lifting until your doctor tell you it's OK. Avoid activities such as vacuuming or swinging a golf club. You can drive after one week if you are comfortable and you are no longer taking prescription pain medications. It is normal to feel tired for serval weeks after your surgery. It is also normal to have difficulty with sleep habits, eating, and bowel movements after surgery. These will go away with time.  Bathing/Showering  You may shower after you come home. Do not soak in a bathtub, hot tub, or swim until the incision heals completely.  Incision Care  Shower every day. Clean your incision with mild soap and water. Pat the area dry with a clean towel. You do not need a  bandage unless otherwise instructed. Do not apply any ointments or creams to your incision. You may have skin glue on your incision. Do not peel it off. It will come off on its own in about one week. Your incision may feel thickened and raised for several weeks after your surgery. This is  normal and the skin will soften over time. For Men Only: It's OK to shave around the incision but do not shave the incision itself for 2 weeks. It is common to have numbness under your chin that could last for several months.  Diet  Resume your normal diet. There are no special food restrictions following this procedure. A low fat/low cholesterol diet is recommended for all patients with vascular disease. In order to heal from your surgery, it is CRITICAL to get adequate nutrition. Your body requires vitamins, minerals, and protein. Vegetables are the best source of vitamins and minerals. Vegetables also provide the perfect balance of protein. Processed food has little nutritional value, so try to avoid this.  Medications  Resume taking all of your medications unless your doctor or physician assistant tells you not to.  If your incision is causing pain, you may take over-the- counter pain relievers such as acetaminophen (Tylenol). If you were prescribed a stronger pain medication, please be aware these medications can cause nausea and constipation.  Prevent nausea by taking the medication with a snack or meal. Avoid constipation by drinking plenty of fluids and eating foods with a high amount of fiber, such as fruits, vegetables, and grains. Do not take Tylenol if you are taking prescription pain medications.  Follow Up  Our office will schedule a follow up appointment 2-3 weeks following discharge.  Please call us immediately for any of the following conditions  Increased pain, redness, drainage (pus) from your incision site. Fever of 101 degrees or higher. If you should develop stroke (slurred speech, difficulty  swallowing, weakness on one side of your body, loss of vision) you should call 911 and go to the nearest emergency room.  Reduce your risk of vascular disease:  Stop smoking. If you would like help call [facility] at 1-800-QUIT-NOW (912 812 3570) or [place] at [phone]. Manage your cholesterol Maintain a desired weight Control your diabetes Keep your blood pressure down  If you have any questions, please call the office at [phone].   Disposition: home  Patient's condition: is Good  Follow up: [facility] in 2 weeks.   Emilie Rutter, PA-C [facility] [phone]   --- For [facility] Registry use ---   Modified Rankin score at D/C (0-6): 0  IV medication needed for:  1. Hypertension: No 2. Hypotension: Yes  Post-op Complications: No  1. Post-op CVA or TIA: No  If yes: Event classification (right eye, left eye, right cortical, left cortical, verterobasilar, other):   If yes: Timing of event (intra-op, <6 hrs post-op, >=6 hrs post-op, unknown):   2. CN injury: No  If yes: CN  injuried   3. Myocardial infarction: No  If yes: Dx by (EKG or clinical, Troponin):   4.  CHF: No  5.  Dysrhythmia (new): No  6. Wound infection: No  7. Reperfusion symptoms: No  8. Return to OR: No  If yes: return to OR for (bleeding, neurologic, other CEA incision, other):   Discharge medications: Statin use:  Yes ASA use:  Yes   Beta blocker use:  Yes ACE-Inhibitor use:  No  ARB use:  No CCB use: Yes P2Y12 Antagonist use: Yes, [ x] Plavix, [ ]  Plasugrel, [ ]  Ticlopinine, [ ]  Ticagrelor, [ ]  Other, [ ]  No for medical reason, [ ]  Non-compliant, [ ]  Not-indicated Anti-coagulant use:  No, [ ]  Warfarin, [ ]  Rivaroxaban, [ ]  Dabigatran,

## 2023-07-09 DIAGNOSIS — M069 Rheumatoid arthritis, unspecified: Secondary | ICD-10-CM | POA: Diagnosis not present

## 2023-07-09 DIAGNOSIS — Z48812 Encounter for surgical aftercare following surgery on the circulatory system: Secondary | ICD-10-CM | POA: Diagnosis not present

## 2023-07-09 DIAGNOSIS — I2510 Atherosclerotic heart disease of native coronary artery without angina pectoris: Secondary | ICD-10-CM | POA: Diagnosis not present

## 2023-07-09 DIAGNOSIS — Z7982 Long term (current) use of aspirin: Secondary | ICD-10-CM | POA: Diagnosis not present

## 2023-07-09 DIAGNOSIS — M1990 Unspecified osteoarthritis, unspecified site: Secondary | ICD-10-CM | POA: Diagnosis not present

## 2023-07-09 DIAGNOSIS — Z87891 Personal history of nicotine dependence: Secondary | ICD-10-CM | POA: Diagnosis not present

## 2023-07-09 DIAGNOSIS — I10 Essential (primary) hypertension: Secondary | ICD-10-CM | POA: Diagnosis not present

## 2023-07-09 DIAGNOSIS — Z556 Problems related to health literacy: Secondary | ICD-10-CM | POA: Diagnosis not present

## 2023-07-09 DIAGNOSIS — Z7902 Long term (current) use of antithrombotics/antiplatelets: Secondary | ICD-10-CM | POA: Diagnosis not present

## 2023-07-09 DIAGNOSIS — M797 Fibromyalgia: Secondary | ICD-10-CM | POA: Diagnosis not present

## 2023-07-09 DIAGNOSIS — Z951 Presence of aortocoronary bypass graft: Secondary | ICD-10-CM | POA: Diagnosis not present

## 2023-07-10 ENCOUNTER — Telehealth: Payer: Self-pay | Admitting: Vascular Surgery

## 2023-07-10 NOTE — Telephone Encounter (Signed)
Patient called describing incisional pain, "head" pain, and chest pain.  Patient requested I "call something in" for her pain and her "nerves." I counseled the patient this would be inappropriate without further evaluation of multiple complaints and I recommended she proceed to the ER.  Rande Brunt. Lenell Antu, MD [facility] Phone Number: [phone] 07/10/2023 11:17 PM

## 2023-07-13 DIAGNOSIS — R110 Nausea: Secondary | ICD-10-CM | POA: Diagnosis not present

## 2023-07-13 DIAGNOSIS — I2510 Atherosclerotic heart disease of native coronary artery without angina pectoris: Secondary | ICD-10-CM | POA: Diagnosis not present

## 2023-07-13 DIAGNOSIS — I10 Essential (primary) hypertension: Secondary | ICD-10-CM | POA: Diagnosis not present

## 2023-07-13 DIAGNOSIS — M1990 Unspecified osteoarthritis, unspecified site: Secondary | ICD-10-CM | POA: Diagnosis not present

## 2023-07-13 DIAGNOSIS — Z951 Presence of aortocoronary bypass graft: Secondary | ICD-10-CM | POA: Diagnosis not present

## 2023-07-13 DIAGNOSIS — Z299 Encounter for prophylactic measures, unspecified: Secondary | ICD-10-CM | POA: Diagnosis not present

## 2023-07-13 DIAGNOSIS — Z48812 Encounter for surgical aftercare following surgery on the circulatory system: Secondary | ICD-10-CM | POA: Diagnosis not present

## 2023-07-13 DIAGNOSIS — Z556 Problems related to health literacy: Secondary | ICD-10-CM | POA: Diagnosis not present

## 2023-07-13 DIAGNOSIS — Z7902 Long term (current) use of antithrombotics/antiplatelets: Secondary | ICD-10-CM | POA: Diagnosis not present

## 2023-07-13 DIAGNOSIS — M069 Rheumatoid arthritis, unspecified: Secondary | ICD-10-CM | POA: Diagnosis not present

## 2023-07-13 DIAGNOSIS — Z7982 Long term (current) use of aspirin: Secondary | ICD-10-CM | POA: Diagnosis not present

## 2023-07-13 DIAGNOSIS — Z87891 Personal history of nicotine dependence: Secondary | ICD-10-CM | POA: Diagnosis not present

## 2023-07-13 DIAGNOSIS — M797 Fibromyalgia: Secondary | ICD-10-CM | POA: Diagnosis not present

## 2023-07-14 DIAGNOSIS — Z951 Presence of aortocoronary bypass graft: Secondary | ICD-10-CM | POA: Diagnosis not present

## 2023-07-14 DIAGNOSIS — M069 Rheumatoid arthritis, unspecified: Secondary | ICD-10-CM | POA: Diagnosis not present

## 2023-07-14 DIAGNOSIS — Z48812 Encounter for surgical aftercare following surgery on the circulatory system: Secondary | ICD-10-CM | POA: Diagnosis not present

## 2023-07-14 DIAGNOSIS — M797 Fibromyalgia: Secondary | ICD-10-CM | POA: Diagnosis not present

## 2023-07-14 DIAGNOSIS — Z7902 Long term (current) use of antithrombotics/antiplatelets: Secondary | ICD-10-CM | POA: Diagnosis not present

## 2023-07-14 DIAGNOSIS — Z556 Problems related to health literacy: Secondary | ICD-10-CM | POA: Diagnosis not present

## 2023-07-14 DIAGNOSIS — I2510 Atherosclerotic heart disease of native coronary artery without angina pectoris: Secondary | ICD-10-CM | POA: Diagnosis not present

## 2023-07-14 DIAGNOSIS — I10 Essential (primary) hypertension: Secondary | ICD-10-CM | POA: Diagnosis not present

## 2023-07-14 DIAGNOSIS — M1990 Unspecified osteoarthritis, unspecified site: Secondary | ICD-10-CM | POA: Diagnosis not present

## 2023-07-14 DIAGNOSIS — Z7982 Long term (current) use of aspirin: Secondary | ICD-10-CM | POA: Diagnosis not present

## 2023-07-14 DIAGNOSIS — Z87891 Personal history of nicotine dependence: Secondary | ICD-10-CM | POA: Diagnosis not present

## 2023-07-16 DIAGNOSIS — I2510 Atherosclerotic heart disease of native coronary artery without angina pectoris: Secondary | ICD-10-CM | POA: Diagnosis not present

## 2023-07-16 DIAGNOSIS — Z951 Presence of aortocoronary bypass graft: Secondary | ICD-10-CM | POA: Diagnosis not present

## 2023-07-16 DIAGNOSIS — Z7902 Long term (current) use of antithrombotics/antiplatelets: Secondary | ICD-10-CM | POA: Diagnosis not present

## 2023-07-16 DIAGNOSIS — M797 Fibromyalgia: Secondary | ICD-10-CM | POA: Diagnosis not present

## 2023-07-16 DIAGNOSIS — Z48812 Encounter for surgical aftercare following surgery on the circulatory system: Secondary | ICD-10-CM | POA: Diagnosis not present

## 2023-07-16 DIAGNOSIS — Z7982 Long term (current) use of aspirin: Secondary | ICD-10-CM | POA: Diagnosis not present

## 2023-07-16 DIAGNOSIS — M069 Rheumatoid arthritis, unspecified: Secondary | ICD-10-CM | POA: Diagnosis not present

## 2023-07-16 DIAGNOSIS — I10 Essential (primary) hypertension: Secondary | ICD-10-CM | POA: Diagnosis not present

## 2023-07-16 DIAGNOSIS — M1990 Unspecified osteoarthritis, unspecified site: Secondary | ICD-10-CM | POA: Diagnosis not present

## 2023-07-16 DIAGNOSIS — Z87891 Personal history of nicotine dependence: Secondary | ICD-10-CM | POA: Diagnosis not present

## 2023-07-16 DIAGNOSIS — Z556 Problems related to health literacy: Secondary | ICD-10-CM | POA: Diagnosis not present

## 2023-07-17 DIAGNOSIS — M797 Fibromyalgia: Secondary | ICD-10-CM | POA: Diagnosis not present

## 2023-07-17 DIAGNOSIS — I10 Essential (primary) hypertension: Secondary | ICD-10-CM | POA: Diagnosis not present

## 2023-07-17 DIAGNOSIS — Z556 Problems related to health literacy: Secondary | ICD-10-CM | POA: Diagnosis not present

## 2023-07-17 DIAGNOSIS — Z7902 Long term (current) use of antithrombotics/antiplatelets: Secondary | ICD-10-CM | POA: Diagnosis not present

## 2023-07-17 DIAGNOSIS — Z7982 Long term (current) use of aspirin: Secondary | ICD-10-CM | POA: Diagnosis not present

## 2023-07-17 DIAGNOSIS — M069 Rheumatoid arthritis, unspecified: Secondary | ICD-10-CM | POA: Diagnosis not present

## 2023-07-17 DIAGNOSIS — Z87891 Personal history of nicotine dependence: Secondary | ICD-10-CM | POA: Diagnosis not present

## 2023-07-17 DIAGNOSIS — Z48812 Encounter for surgical aftercare following surgery on the circulatory system: Secondary | ICD-10-CM | POA: Diagnosis not present

## 2023-07-17 DIAGNOSIS — M1990 Unspecified osteoarthritis, unspecified site: Secondary | ICD-10-CM | POA: Diagnosis not present

## 2023-07-17 DIAGNOSIS — Z951 Presence of aortocoronary bypass graft: Secondary | ICD-10-CM | POA: Diagnosis not present

## 2023-07-17 DIAGNOSIS — I2510 Atherosclerotic heart disease of native coronary artery without angina pectoris: Secondary | ICD-10-CM | POA: Diagnosis not present

## 2023-07-20 DIAGNOSIS — M069 Rheumatoid arthritis, unspecified: Secondary | ICD-10-CM | POA: Diagnosis not present

## 2023-07-20 DIAGNOSIS — Z48812 Encounter for surgical aftercare following surgery on the circulatory system: Secondary | ICD-10-CM | POA: Diagnosis not present

## 2023-07-20 DIAGNOSIS — M1990 Unspecified osteoarthritis, unspecified site: Secondary | ICD-10-CM | POA: Diagnosis not present

## 2023-07-20 DIAGNOSIS — I10 Essential (primary) hypertension: Secondary | ICD-10-CM | POA: Diagnosis not present

## 2023-07-20 DIAGNOSIS — Z556 Problems related to health literacy: Secondary | ICD-10-CM | POA: Diagnosis not present

## 2023-07-20 DIAGNOSIS — I2510 Atherosclerotic heart disease of native coronary artery without angina pectoris: Secondary | ICD-10-CM | POA: Diagnosis not present

## 2023-07-20 DIAGNOSIS — Z7982 Long term (current) use of aspirin: Secondary | ICD-10-CM | POA: Diagnosis not present

## 2023-07-20 DIAGNOSIS — M797 Fibromyalgia: Secondary | ICD-10-CM | POA: Diagnosis not present

## 2023-07-20 DIAGNOSIS — Z87891 Personal history of nicotine dependence: Secondary | ICD-10-CM | POA: Diagnosis not present

## 2023-07-20 DIAGNOSIS — Z7902 Long term (current) use of antithrombotics/antiplatelets: Secondary | ICD-10-CM | POA: Diagnosis not present

## 2023-07-20 DIAGNOSIS — Z951 Presence of aortocoronary bypass graft: Secondary | ICD-10-CM | POA: Diagnosis not present

## 2023-07-21 DIAGNOSIS — Z7902 Long term (current) use of antithrombotics/antiplatelets: Secondary | ICD-10-CM | POA: Diagnosis not present

## 2023-07-21 DIAGNOSIS — M797 Fibromyalgia: Secondary | ICD-10-CM | POA: Diagnosis not present

## 2023-07-21 DIAGNOSIS — Z48812 Encounter for surgical aftercare following surgery on the circulatory system: Secondary | ICD-10-CM | POA: Diagnosis not present

## 2023-07-21 DIAGNOSIS — Z87891 Personal history of nicotine dependence: Secondary | ICD-10-CM | POA: Diagnosis not present

## 2023-07-21 DIAGNOSIS — Z556 Problems related to health literacy: Secondary | ICD-10-CM | POA: Diagnosis not present

## 2023-07-21 DIAGNOSIS — Z951 Presence of aortocoronary bypass graft: Secondary | ICD-10-CM | POA: Diagnosis not present

## 2023-07-21 DIAGNOSIS — Z7982 Long term (current) use of aspirin: Secondary | ICD-10-CM | POA: Diagnosis not present

## 2023-07-21 DIAGNOSIS — M069 Rheumatoid arthritis, unspecified: Secondary | ICD-10-CM | POA: Diagnosis not present

## 2023-07-21 DIAGNOSIS — I2510 Atherosclerotic heart disease of native coronary artery without angina pectoris: Secondary | ICD-10-CM | POA: Diagnosis not present

## 2023-07-21 DIAGNOSIS — I10 Essential (primary) hypertension: Secondary | ICD-10-CM | POA: Diagnosis not present

## 2023-07-21 DIAGNOSIS — M1990 Unspecified osteoarthritis, unspecified site: Secondary | ICD-10-CM | POA: Diagnosis not present

## 2023-07-22 DIAGNOSIS — M1990 Unspecified osteoarthritis, unspecified site: Secondary | ICD-10-CM | POA: Diagnosis not present

## 2023-07-22 DIAGNOSIS — Z48812 Encounter for surgical aftercare following surgery on the circulatory system: Secondary | ICD-10-CM | POA: Diagnosis not present

## 2023-07-22 DIAGNOSIS — Z951 Presence of aortocoronary bypass graft: Secondary | ICD-10-CM | POA: Diagnosis not present

## 2023-07-22 DIAGNOSIS — Z7902 Long term (current) use of antithrombotics/antiplatelets: Secondary | ICD-10-CM | POA: Diagnosis not present

## 2023-07-22 DIAGNOSIS — Z87891 Personal history of nicotine dependence: Secondary | ICD-10-CM | POA: Diagnosis not present

## 2023-07-22 DIAGNOSIS — Z556 Problems related to health literacy: Secondary | ICD-10-CM | POA: Diagnosis not present

## 2023-07-22 DIAGNOSIS — I2510 Atherosclerotic heart disease of native coronary artery without angina pectoris: Secondary | ICD-10-CM | POA: Diagnosis not present

## 2023-07-22 DIAGNOSIS — M069 Rheumatoid arthritis, unspecified: Secondary | ICD-10-CM | POA: Diagnosis not present

## 2023-07-22 DIAGNOSIS — M797 Fibromyalgia: Secondary | ICD-10-CM | POA: Diagnosis not present

## 2023-07-22 DIAGNOSIS — I10 Essential (primary) hypertension: Secondary | ICD-10-CM | POA: Diagnosis not present

## 2023-07-22 DIAGNOSIS — Z7982 Long term (current) use of aspirin: Secondary | ICD-10-CM | POA: Diagnosis not present

## 2023-07-23 DIAGNOSIS — Z556 Problems related to health literacy: Secondary | ICD-10-CM | POA: Diagnosis not present

## 2023-07-23 DIAGNOSIS — M069 Rheumatoid arthritis, unspecified: Secondary | ICD-10-CM | POA: Diagnosis not present

## 2023-07-23 DIAGNOSIS — M1990 Unspecified osteoarthritis, unspecified site: Secondary | ICD-10-CM | POA: Diagnosis not present

## 2023-07-23 DIAGNOSIS — Z7982 Long term (current) use of aspirin: Secondary | ICD-10-CM | POA: Diagnosis not present

## 2023-07-23 DIAGNOSIS — Z7902 Long term (current) use of antithrombotics/antiplatelets: Secondary | ICD-10-CM | POA: Diagnosis not present

## 2023-07-23 DIAGNOSIS — I2510 Atherosclerotic heart disease of native coronary artery without angina pectoris: Secondary | ICD-10-CM | POA: Diagnosis not present

## 2023-07-23 DIAGNOSIS — M797 Fibromyalgia: Secondary | ICD-10-CM | POA: Diagnosis not present

## 2023-07-23 DIAGNOSIS — Z951 Presence of aortocoronary bypass graft: Secondary | ICD-10-CM | POA: Diagnosis not present

## 2023-07-23 DIAGNOSIS — Z48812 Encounter for surgical aftercare following surgery on the circulatory system: Secondary | ICD-10-CM | POA: Diagnosis not present

## 2023-07-23 DIAGNOSIS — I10 Essential (primary) hypertension: Secondary | ICD-10-CM | POA: Diagnosis not present

## 2023-07-23 DIAGNOSIS — Z87891 Personal history of nicotine dependence: Secondary | ICD-10-CM | POA: Diagnosis not present

## 2023-07-24 DIAGNOSIS — M1990 Unspecified osteoarthritis, unspecified site: Secondary | ICD-10-CM | POA: Diagnosis not present

## 2023-07-24 DIAGNOSIS — Z556 Problems related to health literacy: Secondary | ICD-10-CM | POA: Diagnosis not present

## 2023-07-24 DIAGNOSIS — Z7902 Long term (current) use of antithrombotics/antiplatelets: Secondary | ICD-10-CM | POA: Diagnosis not present

## 2023-07-24 DIAGNOSIS — M069 Rheumatoid arthritis, unspecified: Secondary | ICD-10-CM | POA: Diagnosis not present

## 2023-07-24 DIAGNOSIS — Z7982 Long term (current) use of aspirin: Secondary | ICD-10-CM | POA: Diagnosis not present

## 2023-07-24 DIAGNOSIS — I2510 Atherosclerotic heart disease of native coronary artery without angina pectoris: Secondary | ICD-10-CM | POA: Diagnosis not present

## 2023-07-24 DIAGNOSIS — Z87891 Personal history of nicotine dependence: Secondary | ICD-10-CM | POA: Diagnosis not present

## 2023-07-24 DIAGNOSIS — Z951 Presence of aortocoronary bypass graft: Secondary | ICD-10-CM | POA: Diagnosis not present

## 2023-07-24 DIAGNOSIS — I10 Essential (primary) hypertension: Secondary | ICD-10-CM | POA: Diagnosis not present

## 2023-07-24 DIAGNOSIS — Z48812 Encounter for surgical aftercare following surgery on the circulatory system: Secondary | ICD-10-CM | POA: Diagnosis not present

## 2023-07-24 DIAGNOSIS — M797 Fibromyalgia: Secondary | ICD-10-CM | POA: Diagnosis not present

## 2023-07-27 DIAGNOSIS — Z9889 Other specified postprocedural states: Secondary | ICD-10-CM | POA: Diagnosis not present

## 2023-07-27 DIAGNOSIS — J849 Interstitial pulmonary disease, unspecified: Secondary | ICD-10-CM | POA: Diagnosis not present

## 2023-07-27 DIAGNOSIS — I700 Atherosclerosis of aorta: Secondary | ICD-10-CM | POA: Diagnosis not present

## 2023-07-27 DIAGNOSIS — K625 Hemorrhage of anus and rectum: Secondary | ICD-10-CM | POA: Diagnosis not present

## 2023-07-27 DIAGNOSIS — K922 Gastrointestinal hemorrhage, unspecified: Secondary | ICD-10-CM | POA: Diagnosis not present

## 2023-07-27 DIAGNOSIS — R079 Chest pain, unspecified: Secondary | ICD-10-CM | POA: Diagnosis not present

## 2023-07-30 DIAGNOSIS — I10 Essential (primary) hypertension: Secondary | ICD-10-CM | POA: Diagnosis not present

## 2023-07-30 DIAGNOSIS — Z5901 Sheltered homelessness: Secondary | ICD-10-CM | POA: Diagnosis not present

## 2023-07-30 DIAGNOSIS — Z7901 Long term (current) use of anticoagulants: Secondary | ICD-10-CM | POA: Diagnosis not present

## 2023-07-30 DIAGNOSIS — N3000 Acute cystitis without hematuria: Secondary | ICD-10-CM | POA: Diagnosis not present

## 2023-07-30 DIAGNOSIS — Z9049 Acquired absence of other specified parts of digestive tract: Secondary | ICD-10-CM | POA: Diagnosis not present

## 2023-07-30 DIAGNOSIS — Z8616 Personal history of COVID-19: Secondary | ICD-10-CM | POA: Diagnosis not present

## 2023-07-30 DIAGNOSIS — N281 Cyst of kidney, acquired: Secondary | ICD-10-CM | POA: Diagnosis not present

## 2023-07-30 DIAGNOSIS — Z955 Presence of coronary angioplasty implant and graft: Secondary | ICD-10-CM | POA: Diagnosis not present

## 2023-07-30 DIAGNOSIS — K529 Noninfective gastroenteritis and colitis, unspecified: Secondary | ICD-10-CM | POA: Diagnosis not present

## 2023-07-30 DIAGNOSIS — I2510 Atherosclerotic heart disease of native coronary artery without angina pectoris: Secondary | ICD-10-CM | POA: Diagnosis not present

## 2023-07-30 DIAGNOSIS — Z7982 Long term (current) use of aspirin: Secondary | ICD-10-CM | POA: Diagnosis not present

## 2023-07-30 DIAGNOSIS — E872 Acidosis, unspecified: Secondary | ICD-10-CM | POA: Diagnosis not present

## 2023-07-30 DIAGNOSIS — Z5900 Homelessness unspecified: Secondary | ICD-10-CM | POA: Diagnosis not present

## 2023-07-30 DIAGNOSIS — Z792 Long term (current) use of antibiotics: Secondary | ICD-10-CM | POA: Diagnosis not present

## 2023-07-30 DIAGNOSIS — Z881 Allergy status to other antibiotic agents status: Secondary | ICD-10-CM | POA: Diagnosis not present

## 2023-07-30 DIAGNOSIS — R5381 Other malaise: Secondary | ICD-10-CM | POA: Diagnosis not present

## 2023-07-30 DIAGNOSIS — R9089 Other abnormal findings on diagnostic imaging of central nervous system: Secondary | ICD-10-CM | POA: Diagnosis not present

## 2023-07-30 DIAGNOSIS — R2689 Other abnormalities of gait and mobility: Secondary | ICD-10-CM | POA: Diagnosis not present

## 2023-07-30 DIAGNOSIS — G9389 Other specified disorders of brain: Secondary | ICD-10-CM | POA: Diagnosis not present

## 2023-07-30 DIAGNOSIS — Z79899 Other long term (current) drug therapy: Secondary | ICD-10-CM | POA: Diagnosis not present

## 2023-07-30 DIAGNOSIS — Z7902 Long term (current) use of antithrombotics/antiplatelets: Secondary | ICD-10-CM | POA: Diagnosis not present

## 2023-07-30 DIAGNOSIS — E876 Hypokalemia: Secondary | ICD-10-CM | POA: Diagnosis not present

## 2023-07-30 DIAGNOSIS — Z9089 Acquired absence of other organs: Secondary | ICD-10-CM | POA: Diagnosis not present

## 2023-07-30 DIAGNOSIS — N179 Acute kidney failure, unspecified: Secondary | ICD-10-CM | POA: Diagnosis not present

## 2023-07-30 DIAGNOSIS — I959 Hypotension, unspecified: Secondary | ICD-10-CM | POA: Diagnosis not present

## 2023-07-30 DIAGNOSIS — Z885 Allergy status to narcotic agent status: Secondary | ICD-10-CM | POA: Diagnosis not present

## 2023-07-30 DIAGNOSIS — E861 Hypovolemia: Secondary | ICD-10-CM | POA: Diagnosis not present

## 2023-07-30 DIAGNOSIS — A419 Sepsis, unspecified organism: Secondary | ICD-10-CM | POA: Diagnosis not present

## 2023-07-30 DIAGNOSIS — R6520 Severe sepsis without septic shock: Secondary | ICD-10-CM | POA: Diagnosis not present

## 2023-07-30 DIAGNOSIS — Z888 Allergy status to other drugs, medicaments and biological substances status: Secondary | ICD-10-CM | POA: Diagnosis not present

## 2023-07-30 DIAGNOSIS — I739 Peripheral vascular disease, unspecified: Secondary | ICD-10-CM | POA: Diagnosis not present

## 2023-07-30 DIAGNOSIS — R519 Headache, unspecified: Secondary | ICD-10-CM | POA: Diagnosis not present

## 2023-07-30 DIAGNOSIS — N390 Urinary tract infection, site not specified: Secondary | ICD-10-CM | POA: Diagnosis not present

## 2023-07-30 DIAGNOSIS — E8729 Other acidosis: Secondary | ICD-10-CM | POA: Diagnosis not present

## 2023-07-30 DIAGNOSIS — Z87891 Personal history of nicotine dependence: Secondary | ICD-10-CM | POA: Diagnosis not present

## 2023-07-31 DIAGNOSIS — R2689 Other abnormalities of gait and mobility: Secondary | ICD-10-CM | POA: Diagnosis not present

## 2023-07-31 DIAGNOSIS — N179 Acute kidney failure, unspecified: Secondary | ICD-10-CM | POA: Diagnosis not present

## 2023-07-31 DIAGNOSIS — E876 Hypokalemia: Secondary | ICD-10-CM | POA: Diagnosis not present

## 2023-07-31 DIAGNOSIS — E8729 Other acidosis: Secondary | ICD-10-CM | POA: Diagnosis not present

## 2023-07-31 DIAGNOSIS — K529 Noninfective gastroenteritis and colitis, unspecified: Secondary | ICD-10-CM | POA: Diagnosis not present

## 2023-07-31 DIAGNOSIS — I10 Essential (primary) hypertension: Secondary | ICD-10-CM | POA: Diagnosis not present

## 2023-08-01 DIAGNOSIS — N179 Acute kidney failure, unspecified: Secondary | ICD-10-CM | POA: Diagnosis not present

## 2023-08-01 DIAGNOSIS — Z7982 Long term (current) use of aspirin: Secondary | ICD-10-CM | POA: Diagnosis not present

## 2023-08-01 DIAGNOSIS — I739 Peripheral vascular disease, unspecified: Secondary | ICD-10-CM | POA: Diagnosis not present

## 2023-08-01 DIAGNOSIS — Z792 Long term (current) use of antibiotics: Secondary | ICD-10-CM | POA: Diagnosis not present

## 2023-08-01 DIAGNOSIS — E872 Acidosis, unspecified: Secondary | ICD-10-CM | POA: Diagnosis not present

## 2023-08-01 DIAGNOSIS — E876 Hypokalemia: Secondary | ICD-10-CM | POA: Diagnosis not present

## 2023-08-01 DIAGNOSIS — R5381 Other malaise: Secondary | ICD-10-CM | POA: Diagnosis not present

## 2023-08-01 DIAGNOSIS — Z5900 Homelessness unspecified: Secondary | ICD-10-CM | POA: Diagnosis not present

## 2023-08-01 DIAGNOSIS — I10 Essential (primary) hypertension: Secondary | ICD-10-CM | POA: Diagnosis not present

## 2023-08-01 DIAGNOSIS — K529 Noninfective gastroenteritis and colitis, unspecified: Secondary | ICD-10-CM | POA: Diagnosis not present

## 2023-08-01 DIAGNOSIS — Z7902 Long term (current) use of antithrombotics/antiplatelets: Secondary | ICD-10-CM | POA: Diagnosis not present

## 2023-08-02 DIAGNOSIS — Z5900 Homelessness unspecified: Secondary | ICD-10-CM | POA: Diagnosis not present

## 2023-08-02 DIAGNOSIS — Z7982 Long term (current) use of aspirin: Secondary | ICD-10-CM | POA: Diagnosis not present

## 2023-08-02 DIAGNOSIS — Z792 Long term (current) use of antibiotics: Secondary | ICD-10-CM | POA: Diagnosis not present

## 2023-08-02 DIAGNOSIS — I739 Peripheral vascular disease, unspecified: Secondary | ICD-10-CM | POA: Diagnosis not present

## 2023-08-02 DIAGNOSIS — R5381 Other malaise: Secondary | ICD-10-CM | POA: Diagnosis not present

## 2023-08-02 DIAGNOSIS — Z7901 Long term (current) use of anticoagulants: Secondary | ICD-10-CM | POA: Diagnosis not present

## 2023-08-02 DIAGNOSIS — E876 Hypokalemia: Secondary | ICD-10-CM | POA: Diagnosis not present

## 2023-08-02 DIAGNOSIS — R519 Headache, unspecified: Secondary | ICD-10-CM | POA: Diagnosis not present

## 2023-08-02 DIAGNOSIS — K529 Noninfective gastroenteritis and colitis, unspecified: Secondary | ICD-10-CM | POA: Diagnosis not present

## 2023-08-02 DIAGNOSIS — I10 Essential (primary) hypertension: Secondary | ICD-10-CM | POA: Diagnosis not present

## 2023-08-02 DIAGNOSIS — N390 Urinary tract infection, site not specified: Secondary | ICD-10-CM | POA: Diagnosis not present

## 2023-08-02 DIAGNOSIS — N179 Acute kidney failure, unspecified: Secondary | ICD-10-CM | POA: Diagnosis not present

## 2023-08-02 DIAGNOSIS — G9389 Other specified disorders of brain: Secondary | ICD-10-CM | POA: Diagnosis not present

## 2023-08-02 DIAGNOSIS — R9089 Other abnormal findings on diagnostic imaging of central nervous system: Secondary | ICD-10-CM | POA: Diagnosis not present

## 2023-08-03 DIAGNOSIS — R5381 Other malaise: Secondary | ICD-10-CM | POA: Diagnosis not present

## 2023-08-03 DIAGNOSIS — E876 Hypokalemia: Secondary | ICD-10-CM | POA: Diagnosis not present

## 2023-08-03 DIAGNOSIS — N179 Acute kidney failure, unspecified: Secondary | ICD-10-CM | POA: Diagnosis not present

## 2023-08-03 DIAGNOSIS — N390 Urinary tract infection, site not specified: Secondary | ICD-10-CM | POA: Diagnosis not present

## 2023-08-03 DIAGNOSIS — K529 Noninfective gastroenteritis and colitis, unspecified: Secondary | ICD-10-CM | POA: Diagnosis not present

## 2023-08-03 DIAGNOSIS — I739 Peripheral vascular disease, unspecified: Secondary | ICD-10-CM | POA: Diagnosis not present

## 2023-08-03 DIAGNOSIS — R6520 Severe sepsis without septic shock: Secondary | ICD-10-CM | POA: Diagnosis not present

## 2023-08-03 DIAGNOSIS — A419 Sepsis, unspecified organism: Secondary | ICD-10-CM | POA: Diagnosis not present

## 2023-08-03 DIAGNOSIS — I10 Essential (primary) hypertension: Secondary | ICD-10-CM | POA: Diagnosis not present

## 2023-08-06 DIAGNOSIS — E860 Dehydration: Secondary | ICD-10-CM | POA: Diagnosis not present

## 2023-08-06 DIAGNOSIS — Z885 Allergy status to narcotic agent status: Secondary | ICD-10-CM | POA: Diagnosis not present

## 2023-08-06 DIAGNOSIS — Z5900 Homelessness unspecified: Secondary | ICD-10-CM | POA: Diagnosis not present

## 2023-08-06 DIAGNOSIS — Z888 Allergy status to other drugs, medicaments and biological substances status: Secondary | ICD-10-CM | POA: Diagnosis not present

## 2023-08-06 DIAGNOSIS — Z7902 Long term (current) use of antithrombotics/antiplatelets: Secondary | ICD-10-CM | POA: Diagnosis not present

## 2023-08-06 DIAGNOSIS — Z79899 Other long term (current) drug therapy: Secondary | ICD-10-CM | POA: Diagnosis not present

## 2023-08-06 DIAGNOSIS — T68XXXA Hypothermia, initial encounter: Secondary | ICD-10-CM | POA: Diagnosis not present

## 2023-08-06 DIAGNOSIS — Z955 Presence of coronary angioplasty implant and graft: Secondary | ICD-10-CM | POA: Diagnosis not present

## 2023-08-06 DIAGNOSIS — Z87891 Personal history of nicotine dependence: Secondary | ICD-10-CM | POA: Diagnosis not present

## 2023-08-06 DIAGNOSIS — I2510 Atherosclerotic heart disease of native coronary artery without angina pectoris: Secondary | ICD-10-CM | POA: Diagnosis not present

## 2023-08-06 DIAGNOSIS — Z881 Allergy status to other antibiotic agents status: Secondary | ICD-10-CM | POA: Diagnosis not present

## 2023-08-06 DIAGNOSIS — Z7982 Long term (current) use of aspirin: Secondary | ICD-10-CM | POA: Diagnosis not present

## 2023-08-06 DIAGNOSIS — I959 Hypotension, unspecified: Secondary | ICD-10-CM | POA: Diagnosis not present

## 2023-08-08 DIAGNOSIS — Z87891 Personal history of nicotine dependence: Secondary | ICD-10-CM | POA: Diagnosis not present

## 2023-08-08 DIAGNOSIS — Z79899 Other long term (current) drug therapy: Secondary | ICD-10-CM | POA: Diagnosis not present

## 2023-08-08 DIAGNOSIS — R109 Unspecified abdominal pain: Secondary | ICD-10-CM | POA: Diagnosis not present

## 2023-08-08 DIAGNOSIS — R5381 Other malaise: Secondary | ICD-10-CM | POA: Diagnosis not present

## 2023-08-08 DIAGNOSIS — Z4801 Encounter for change or removal of surgical wound dressing: Secondary | ICD-10-CM | POA: Diagnosis not present

## 2023-08-08 DIAGNOSIS — Z9889 Other specified postprocedural states: Secondary | ICD-10-CM | POA: Diagnosis not present

## 2023-08-08 DIAGNOSIS — Z7982 Long term (current) use of aspirin: Secondary | ICD-10-CM | POA: Diagnosis not present

## 2023-08-08 DIAGNOSIS — Z955 Presence of coronary angioplasty implant and graft: Secondary | ICD-10-CM | POA: Diagnosis not present

## 2023-08-08 DIAGNOSIS — Z7902 Long term (current) use of antithrombotics/antiplatelets: Secondary | ICD-10-CM | POA: Diagnosis not present

## 2023-08-08 DIAGNOSIS — Z4889 Encounter for other specified surgical aftercare: Secondary | ICD-10-CM | POA: Diagnosis not present

## 2023-08-08 DIAGNOSIS — T8131XA Disruption of external operation (surgical) wound, not elsewhere classified, initial encounter: Secondary | ICD-10-CM | POA: Diagnosis not present

## 2023-08-08 DIAGNOSIS — Z8616 Personal history of COVID-19: Secondary | ICD-10-CM | POA: Diagnosis not present

## 2023-08-08 DIAGNOSIS — Z5900 Homelessness unspecified: Secondary | ICD-10-CM | POA: Diagnosis not present

## 2023-08-08 DIAGNOSIS — Z743 Need for continuous supervision: Secondary | ICD-10-CM | POA: Diagnosis not present

## 2023-08-08 DIAGNOSIS — I2510 Atherosclerotic heart disease of native coronary artery without angina pectoris: Secondary | ICD-10-CM | POA: Diagnosis not present

## 2023-08-28 DIAGNOSIS — I700 Atherosclerosis of aorta: Secondary | ICD-10-CM | POA: Diagnosis not present

## 2023-08-28 DIAGNOSIS — N1832 Chronic kidney disease, stage 3b: Secondary | ICD-10-CM | POA: Diagnosis not present

## 2023-08-28 DIAGNOSIS — I10 Essential (primary) hypertension: Secondary | ICD-10-CM | POA: Diagnosis not present

## 2023-08-28 DIAGNOSIS — I25119 Atherosclerotic heart disease of native coronary artery with unspecified angina pectoris: Secondary | ICD-10-CM | POA: Diagnosis not present

## 2023-08-28 DIAGNOSIS — Z299 Encounter for prophylactic measures, unspecified: Secondary | ICD-10-CM | POA: Diagnosis not present

## 2023-09-07 ENCOUNTER — Telehealth: Payer: Self-pay | Admitting: Diagnostic Neuroimaging

## 2023-09-07 ENCOUNTER — Ambulatory Visit: Payer: 59 | Admitting: Diagnostic Neuroimaging

## 2023-09-07 NOTE — Telephone Encounter (Signed)
 Appointment cx, pt sick will call back to r/s

## 2023-09-15 DIAGNOSIS — I700 Atherosclerosis of aorta: Secondary | ICD-10-CM | POA: Diagnosis not present

## 2023-09-15 DIAGNOSIS — I779 Disorder of arteries and arterioles, unspecified: Secondary | ICD-10-CM | POA: Diagnosis not present

## 2023-09-15 DIAGNOSIS — H04309 Unspecified dacryocystitis of unspecified lacrimal passage: Secondary | ICD-10-CM | POA: Diagnosis not present

## 2023-09-15 DIAGNOSIS — I10 Essential (primary) hypertension: Secondary | ICD-10-CM | POA: Diagnosis not present

## 2023-09-15 DIAGNOSIS — Z299 Encounter for prophylactic measures, unspecified: Secondary | ICD-10-CM | POA: Diagnosis not present

## 2023-10-14 DIAGNOSIS — M6281 Muscle weakness (generalized): Secondary | ICD-10-CM | POA: Diagnosis not present

## 2023-10-20 ENCOUNTER — Encounter (HOSPITAL_COMMUNITY): Payer: Self-pay

## 2023-10-20 ENCOUNTER — Other Ambulatory Visit: Payer: Self-pay

## 2023-10-20 ENCOUNTER — Inpatient Hospital Stay (HOSPITAL_COMMUNITY)
Admission: EM | Admit: 2023-10-20 | Discharge: 2023-10-26 | DRG: 291 | Disposition: A | Discharge status: Skilled Nursing Facility | Attending: Internal Medicine | Admitting: Internal Medicine

## 2023-10-20 ENCOUNTER — Emergency Department (HOSPITAL_COMMUNITY)

## 2023-10-20 DIAGNOSIS — M1711 Unilateral primary osteoarthritis, right knee: Secondary | ICD-10-CM | POA: Diagnosis not present

## 2023-10-20 DIAGNOSIS — J84112 Idiopathic pulmonary fibrosis: Secondary | ICD-10-CM | POA: Diagnosis present

## 2023-10-20 DIAGNOSIS — E039 Hypothyroidism, unspecified: Secondary | ICD-10-CM | POA: Diagnosis present

## 2023-10-20 DIAGNOSIS — E785 Hyperlipidemia, unspecified: Secondary | ICD-10-CM | POA: Diagnosis present

## 2023-10-20 DIAGNOSIS — Z886 Allergy status to analgesic agent status: Secondary | ICD-10-CM

## 2023-10-20 DIAGNOSIS — E871 Hypo-osmolality and hyponatremia: Secondary | ICD-10-CM | POA: Diagnosis present

## 2023-10-20 DIAGNOSIS — J9811 Atelectasis: Secondary | ICD-10-CM | POA: Diagnosis present

## 2023-10-20 DIAGNOSIS — D649 Anemia, unspecified: Secondary | ICD-10-CM | POA: Diagnosis present

## 2023-10-20 DIAGNOSIS — R54 Age-related physical debility: Secondary | ICD-10-CM | POA: Diagnosis present

## 2023-10-20 DIAGNOSIS — Z881 Allergy status to other antibiotic agents status: Secondary | ICD-10-CM

## 2023-10-20 DIAGNOSIS — Z8261 Family history of arthritis: Secondary | ICD-10-CM

## 2023-10-20 DIAGNOSIS — E119 Type 2 diabetes mellitus without complications: Secondary | ICD-10-CM | POA: Diagnosis not present

## 2023-10-20 DIAGNOSIS — E66811 Obesity, class 1: Secondary | ICD-10-CM | POA: Diagnosis present

## 2023-10-20 DIAGNOSIS — R1319 Other dysphagia: Secondary | ICD-10-CM | POA: Diagnosis not present

## 2023-10-20 DIAGNOSIS — I6529 Occlusion and stenosis of unspecified carotid artery: Secondary | ICD-10-CM | POA: Diagnosis present

## 2023-10-20 DIAGNOSIS — E782 Mixed hyperlipidemia: Secondary | ICD-10-CM | POA: Diagnosis not present

## 2023-10-20 DIAGNOSIS — I701 Atherosclerosis of renal artery: Secondary | ICD-10-CM | POA: Diagnosis present

## 2023-10-20 DIAGNOSIS — N1832 Chronic kidney disease, stage 3b: Secondary | ICD-10-CM | POA: Diagnosis present

## 2023-10-20 DIAGNOSIS — Z5986 Financial insecurity: Secondary | ICD-10-CM

## 2023-10-20 DIAGNOSIS — Z95828 Presence of other vascular implants and grafts: Secondary | ICD-10-CM

## 2023-10-20 DIAGNOSIS — R0602 Shortness of breath: Secondary | ICD-10-CM | POA: Diagnosis not present

## 2023-10-20 DIAGNOSIS — M797 Fibromyalgia: Secondary | ICD-10-CM | POA: Diagnosis present

## 2023-10-20 DIAGNOSIS — R262 Difficulty in walking, not elsewhere classified: Secondary | ICD-10-CM | POA: Diagnosis not present

## 2023-10-20 DIAGNOSIS — I5033 Acute on chronic diastolic (congestive) heart failure: Secondary | ICD-10-CM | POA: Diagnosis present

## 2023-10-20 DIAGNOSIS — Z7982 Long term (current) use of aspirin: Secondary | ICD-10-CM

## 2023-10-20 DIAGNOSIS — R570 Cardiogenic shock: Secondary | ICD-10-CM | POA: Diagnosis present

## 2023-10-20 DIAGNOSIS — E669 Obesity, unspecified: Secondary | ICD-10-CM | POA: Diagnosis present

## 2023-10-20 DIAGNOSIS — I130 Hypertensive heart and chronic kidney disease with heart failure and stage 1 through stage 4 chronic kidney disease, or unspecified chronic kidney disease: Principal | ICD-10-CM | POA: Diagnosis present

## 2023-10-20 DIAGNOSIS — K219 Gastro-esophageal reflux disease without esophagitis: Secondary | ICD-10-CM | POA: Diagnosis present

## 2023-10-20 DIAGNOSIS — G2581 Restless legs syndrome: Secondary | ICD-10-CM | POA: Diagnosis present

## 2023-10-20 DIAGNOSIS — Z96652 Presence of left artificial knee joint: Secondary | ICD-10-CM | POA: Diagnosis present

## 2023-10-20 DIAGNOSIS — Z6830 Body mass index (BMI) 30.0-30.9, adult: Secondary | ICD-10-CM

## 2023-10-20 DIAGNOSIS — I2510 Atherosclerotic heart disease of native coronary artery without angina pectoris: Secondary | ICD-10-CM | POA: Diagnosis present

## 2023-10-20 DIAGNOSIS — I739 Peripheral vascular disease, unspecified: Secondary | ICD-10-CM | POA: Diagnosis present

## 2023-10-20 DIAGNOSIS — I252 Old myocardial infarction: Secondary | ICD-10-CM | POA: Diagnosis not present

## 2023-10-20 DIAGNOSIS — N179 Acute kidney failure, unspecified: Secondary | ICD-10-CM | POA: Diagnosis present

## 2023-10-20 DIAGNOSIS — I10 Essential (primary) hypertension: Secondary | ICD-10-CM | POA: Diagnosis not present

## 2023-10-20 DIAGNOSIS — Z981 Arthrodesis status: Secondary | ICD-10-CM

## 2023-10-20 DIAGNOSIS — R110 Nausea: Secondary | ICD-10-CM | POA: Diagnosis not present

## 2023-10-20 DIAGNOSIS — R0981 Nasal congestion: Secondary | ICD-10-CM | POA: Diagnosis not present

## 2023-10-20 DIAGNOSIS — I700 Atherosclerosis of aorta: Secondary | ICD-10-CM | POA: Diagnosis present

## 2023-10-20 DIAGNOSIS — T82855D Stenosis of coronary artery stent, subsequent encounter: Secondary | ICD-10-CM

## 2023-10-20 DIAGNOSIS — I200 Unstable angina: Secondary | ICD-10-CM | POA: Diagnosis not present

## 2023-10-20 DIAGNOSIS — I5020 Unspecified systolic (congestive) heart failure: Principal | ICD-10-CM

## 2023-10-20 DIAGNOSIS — I509 Heart failure, unspecified: Secondary | ICD-10-CM | POA: Diagnosis not present

## 2023-10-20 DIAGNOSIS — Z9049 Acquired absence of other specified parts of digestive tract: Secondary | ICD-10-CM

## 2023-10-20 DIAGNOSIS — F32A Depression, unspecified: Secondary | ICD-10-CM | POA: Diagnosis present

## 2023-10-20 DIAGNOSIS — Z8249 Family history of ischemic heart disease and other diseases of the circulatory system: Secondary | ICD-10-CM

## 2023-10-20 DIAGNOSIS — I2489 Other forms of acute ischemic heart disease: Secondary | ICD-10-CM | POA: Diagnosis present

## 2023-10-20 DIAGNOSIS — J9601 Acute respiratory failure with hypoxia: Secondary | ICD-10-CM | POA: Diagnosis present

## 2023-10-20 DIAGNOSIS — I6523 Occlusion and stenosis of bilateral carotid arteries: Secondary | ICD-10-CM | POA: Diagnosis not present

## 2023-10-20 DIAGNOSIS — M6281 Muscle weakness (generalized): Secondary | ICD-10-CM | POA: Diagnosis not present

## 2023-10-20 DIAGNOSIS — G4489 Other headache syndrome: Secondary | ICD-10-CM | POA: Diagnosis not present

## 2023-10-20 DIAGNOSIS — Z91048 Other nonmedicinal substance allergy status: Secondary | ICD-10-CM

## 2023-10-20 DIAGNOSIS — Y831 Surgical operation with implant of artificial internal device as the cause of abnormal reaction of the patient, or of later complication, without mention of misadventure at the time of the procedure: Secondary | ICD-10-CM | POA: Diagnosis present

## 2023-10-20 DIAGNOSIS — F411 Generalized anxiety disorder: Secondary | ICD-10-CM | POA: Diagnosis present

## 2023-10-20 DIAGNOSIS — M069 Rheumatoid arthritis, unspecified: Secondary | ICD-10-CM | POA: Diagnosis present

## 2023-10-20 DIAGNOSIS — Z743 Need for continuous supervision: Secondary | ICD-10-CM | POA: Diagnosis not present

## 2023-10-20 DIAGNOSIS — Z833 Family history of diabetes mellitus: Secondary | ICD-10-CM

## 2023-10-20 DIAGNOSIS — Z885 Allergy status to narcotic agent status: Secondary | ICD-10-CM

## 2023-10-20 DIAGNOSIS — Z83438 Family history of other disorder of lipoprotein metabolism and other lipidemia: Secondary | ICD-10-CM

## 2023-10-20 DIAGNOSIS — R0689 Other abnormalities of breathing: Secondary | ICD-10-CM | POA: Diagnosis not present

## 2023-10-20 DIAGNOSIS — Z79899 Other long term (current) drug therapy: Secondary | ICD-10-CM

## 2023-10-20 DIAGNOSIS — J069 Acute upper respiratory infection, unspecified: Secondary | ICD-10-CM | POA: Diagnosis not present

## 2023-10-20 DIAGNOSIS — Z7902 Long term (current) use of antithrombotics/antiplatelets: Secondary | ICD-10-CM

## 2023-10-20 DIAGNOSIS — I5031 Acute diastolic (congestive) heart failure: Secondary | ICD-10-CM | POA: Diagnosis not present

## 2023-10-20 DIAGNOSIS — I25110 Atherosclerotic heart disease of native coronary artery with unstable angina pectoris: Secondary | ICD-10-CM | POA: Diagnosis not present

## 2023-10-20 DIAGNOSIS — R7989 Other specified abnormal findings of blood chemistry: Secondary | ICD-10-CM | POA: Diagnosis not present

## 2023-10-20 DIAGNOSIS — I110 Hypertensive heart disease with heart failure: Secondary | ICD-10-CM | POA: Diagnosis not present

## 2023-10-20 DIAGNOSIS — Z87891 Personal history of nicotine dependence: Secondary | ICD-10-CM

## 2023-10-20 DIAGNOSIS — Z888 Allergy status to other drugs, medicaments and biological substances status: Secondary | ICD-10-CM

## 2023-10-20 DIAGNOSIS — I5032 Chronic diastolic (congestive) heart failure: Secondary | ICD-10-CM

## 2023-10-20 DIAGNOSIS — Z5982 Transportation insecurity: Secondary | ICD-10-CM

## 2023-10-20 DIAGNOSIS — R918 Other nonspecific abnormal finding of lung field: Secondary | ICD-10-CM | POA: Diagnosis not present

## 2023-10-20 DIAGNOSIS — Z90710 Acquired absence of both cervix and uterus: Secondary | ICD-10-CM

## 2023-10-20 DIAGNOSIS — I517 Cardiomegaly: Secondary | ICD-10-CM | POA: Diagnosis not present

## 2023-10-20 DIAGNOSIS — J90 Pleural effusion, not elsewhere classified: Secondary | ICD-10-CM | POA: Diagnosis not present

## 2023-10-20 LAB — TROPONIN I (HIGH SENSITIVITY)
Troponin I (High Sensitivity): 46 ng/L — ABNORMAL HIGH (ref <18)
Troponin I (High Sensitivity): 106 ng/L (ref <18)

## 2023-10-20 LAB — RESP PANEL BY RT-PCR (RSV, FLU A&B, COVID)  RVPGX2
Influenza A by PCR: NEGATIVE
Influenza B by PCR: NEGATIVE
Resp Syncytial Virus by PCR: NEGATIVE
SARS Coronavirus 2 by RT PCR: NEGATIVE

## 2023-10-20 LAB — CBC
HCT: 34.3 % — ABNORMAL LOW (ref 36.0–46.0)
Hemoglobin: 10.6 g/dL — ABNORMAL LOW (ref 12.0–15.0)
MCH: 29.9 pg (ref 26.0–34.0)
MCHC: 30.9 g/dL (ref 30.0–36.0)
MCV: 96.6 fL (ref 80.0–100.0)
Platelets: 192 10*3/uL (ref 150–400)
RBC: 3.55 MIL/uL — ABNORMAL LOW (ref 3.87–5.11)
RDW: 15.5 % (ref 11.5–15.5)
WBC: 7.8 10*3/uL (ref 4.0–10.5)
nRBC: 1 % — ABNORMAL HIGH (ref 0.0–0.2)

## 2023-10-20 LAB — CBC WITH DIFFERENTIAL/PLATELET
Abs Immature Granulocytes: 0.06 10*3/uL (ref 0.00–0.07)
Basophils Absolute: 0 10*3/uL (ref 0.0–0.1)
Basophils Relative: 1 %
Eosinophils Absolute: 0 10*3/uL (ref 0.0–0.5)
Eosinophils Relative: 1 %
HCT: 33.2 % — ABNORMAL LOW (ref 36.0–46.0)
Hemoglobin: 10.3 g/dL — ABNORMAL LOW (ref 12.0–15.0)
Immature Granulocytes: 1 %
Lymphocytes Relative: 11 %
Lymphs Abs: 0.9 10*3/uL (ref 0.7–4.0)
MCH: 30.5 pg (ref 26.0–34.0)
MCHC: 31 g/dL (ref 30.0–36.0)
MCV: 98.2 fL (ref 80.0–100.0)
Monocytes Absolute: 0.4 10*3/uL (ref 0.1–1.0)
Monocytes Relative: 5 %
Neutro Abs: 6.7 10*3/uL (ref 1.7–7.7)
Neutrophils Relative %: 81 %
Platelets: 181 10*3/uL (ref 150–400)
RBC: 3.38 MIL/uL — ABNORMAL LOW (ref 3.87–5.11)
RDW: 15.8 % — ABNORMAL HIGH (ref 11.5–15.5)
WBC: 8.1 10*3/uL (ref 4.0–10.5)
nRBC: 0.9 % — ABNORMAL HIGH (ref 0.0–0.2)

## 2023-10-20 LAB — BLOOD GAS, VENOUS
Acid-Base Excess: 0.3 mmol/L (ref 0.0–2.0)
Bicarbonate: 27.3 mmol/L (ref 20.0–28.0)
Drawn by: 6509
O2 Saturation: 56 %
Patient temperature: 37.3
pCO2, Ven: 54 mmHg (ref 44–60)
pH, Ven: 7.32 (ref 7.25–7.43)
pO2, Ven: 39 mmHg (ref 32–45)

## 2023-10-20 LAB — COMPREHENSIVE METABOLIC PANEL WITH GFR
ALT: 16 U/L (ref 0–44)
AST: 23 U/L (ref 15–41)
Albumin: 3.4 g/dL — ABNORMAL LOW (ref 3.5–5.0)
Alkaline Phosphatase: 70 U/L (ref 38–126)
Anion gap: 12 (ref 5–15)
BUN: 29 mg/dL — ABNORMAL HIGH (ref 8–23)
CO2: 24 mmol/L (ref 22–32)
Calcium: 9.2 mg/dL (ref 8.9–10.3)
Chloride: 97 mmol/L — ABNORMAL LOW (ref 98–111)
Creatinine, Ser: 1.28 mg/dL — ABNORMAL HIGH (ref 0.44–1.00)
GFR, Estimated: 44 mL/min — ABNORMAL LOW (ref >60)
Glucose, Bld: 115 mg/dL — ABNORMAL HIGH (ref 70–99)
Potassium: 4.7 mmol/L (ref 3.5–5.1)
Sodium: 133 mmol/L — ABNORMAL LOW (ref 135–145)
Total Bilirubin: 1.1 mg/dL (ref 0.0–1.2)
Total Protein: 7.1 g/dL (ref 6.5–8.1)

## 2023-10-20 LAB — IRON AND TIBC
Iron: 50 ug/dL (ref 28–170)
Saturation Ratios: 13 % (ref 10.4–31.8)
TIBC: 382 ug/dL (ref 250–450)
UIBC: 332 ug/dL

## 2023-10-20 LAB — PROTIME-INR
INR: 1.1 (ref 0.8–1.2)
Prothrombin Time: 14.8 s (ref 11.4–15.2)

## 2023-10-20 LAB — VITAMIN B12: Vitamin B-12: 1223 pg/mL — ABNORMAL HIGH (ref 180–914)

## 2023-10-20 LAB — RETICULOCYTES
Immature Retic Fract: 38.9 % — ABNORMAL HIGH (ref 2.3–15.9)
RBC.: 3.47 MIL/uL — ABNORMAL LOW (ref 3.87–5.11)
Retic Count, Absolute: 147.1 10*3/uL (ref 19.0–186.0)
Retic Ct Pct: 4.2 % — ABNORMAL HIGH (ref 0.4–3.1)

## 2023-10-20 LAB — BRAIN NATRIURETIC PEPTIDE: B Natriuretic Peptide: 1467 pg/mL — ABNORMAL HIGH (ref 0.0–100.0)

## 2023-10-20 LAB — LACTIC ACID, PLASMA
Lactic Acid, Venous: 1.8 mmol/L (ref 0.5–1.9)
Lactic Acid, Venous: 1.1 mmol/L (ref 0.5–1.9)

## 2023-10-20 LAB — MRSA NEXT GEN BY PCR, NASAL: MRSA by PCR Next Gen: DETECTED — AB

## 2023-10-20 LAB — FOLATE: Folate: 10.6 ng/mL (ref >5.9)

## 2023-10-20 LAB — FERRITIN: Ferritin: 335 ng/mL — ABNORMAL HIGH (ref 11–307)

## 2023-10-20 LAB — CREATININE, SERUM
Creatinine, Ser: 1.25 mg/dL — ABNORMAL HIGH (ref 0.44–1.00)
GFR, Estimated: 45 mL/min — ABNORMAL LOW (ref >60)

## 2023-10-20 LAB — D-DIMER, QUANTITATIVE: D-Dimer, Quant: 1.23 ug{FEU}/mL — ABNORMAL HIGH (ref 0.00–0.50)

## 2023-10-20 MED ORDER — SODIUM CHLORIDE 0.9 % IV SOLN: 250.0000 mL | INTRAVENOUS | Status: AC | PRN

## 2023-10-20 MED ORDER — LEVOTHYROXINE SODIUM 25 MCG PO TABS
25.0000 ug | ORAL_TABLET | Freq: Every day | ORAL | Status: DC
  Administered 2023-10-21 – 2023-10-26 (×6): 25 ug via ORAL
  Filled 2023-10-20 (×2): qty 1
  Filled 2023-10-20: qty 0.5
  Filled 2023-10-20 (×3): qty 1
  Filled 2023-10-20: qty 0.5
  Filled 2023-10-20: qty 1
  Filled 2023-10-20: qty 0.5

## 2023-10-20 MED ORDER — ALPRAZOLAM 0.5 MG PO TABS
0.5000 mg | ORAL_TABLET | Freq: Three times a day (TID) | ORAL | Status: DC | PRN
  Administered 2023-10-24: 0.5 mg via ORAL
  Filled 2023-10-20: qty 1

## 2023-10-20 MED ORDER — MUPIROCIN 2 % EX OINT
1.0000 | TOPICAL_OINTMENT | Freq: Two times a day (BID) | CUTANEOUS | Status: AC
  Administered 2023-10-20 – 2023-10-25 (×10): 1 via NASAL
  Filled 2023-10-20 (×3): qty 22

## 2023-10-20 MED ORDER — MIDODRINE HCL 5 MG PO TABS
5.0000 mg | ORAL_TABLET | Freq: Three times a day (TID) | ORAL | Status: DC
  Administered 2023-10-20 – 2023-10-21 (×3): 5 mg via ORAL
  Filled 2023-10-20 (×3): qty 1

## 2023-10-20 MED ORDER — SERTRALINE HCL 50 MG PO TABS
50.0000 mg | ORAL_TABLET | Freq: Every day | ORAL | Status: DC
  Administered 2023-10-21 – 2023-10-26 (×6): 50 mg via ORAL
  Filled 2023-10-20 (×6): qty 1

## 2023-10-20 MED ORDER — ALBUTEROL SULFATE (2.5 MG/3ML) 0.083% IN NEBU: 2.5000 mg | INHALATION_SOLUTION | RESPIRATORY_TRACT | Status: DC | PRN

## 2023-10-20 MED ORDER — ACETAMINOPHEN 325 MG PO TABS
650.0000 mg | ORAL_TABLET | ORAL | Status: DC | PRN
  Administered 2023-10-20 – 2023-10-22 (×3): 650 mg via ORAL
  Filled 2023-10-20 (×4): qty 2

## 2023-10-20 MED ORDER — ALBUTEROL SULFATE HFA 108 (90 BASE) MCG/ACT IN AERS: 2.0000 | INHALATION_SPRAY | RESPIRATORY_TRACT | Status: DC | PRN

## 2023-10-20 MED ORDER — FUROSEMIDE 10 MG/ML IJ SOLN
40.0000 mg | Freq: Once | INTRAMUSCULAR | Status: AC
Start: 2023-10-20 — End: 2023-10-20
  Administered 2023-10-20: 40 mg via INTRAVENOUS
  Filled 2023-10-20: qty 4

## 2023-10-20 MED ORDER — HEPARIN SODIUM (PORCINE) 5000 UNIT/ML IJ SOLN
5000.0000 [IU] | Freq: Three times a day (TID) | INTRAMUSCULAR | Status: DC
Start: 2023-10-20 — End: 2023-10-21
  Administered 2023-10-20 – 2023-10-21 (×2): 5000 [IU] via SUBCUTANEOUS
  Filled 2023-10-20 (×2): qty 1

## 2023-10-20 MED ORDER — NOREPINEPHRINE 4 MG/250ML-% IV SOLN
INTRAVENOUS | Status: AC
  Administered 2023-10-20: 2 ug/min via INTRAVENOUS
  Filled 2023-10-20: qty 250

## 2023-10-20 MED ORDER — NOREPINEPHRINE 4 MG/250ML-% IV SOLN
0.0000 ug/min | INTRAVENOUS | Status: DC
  Administered 2023-10-21: 6 ug/min via INTRAVENOUS
  Administered 2023-10-21: 8 ug/min via INTRAVENOUS
  Filled 2023-10-20 (×2): qty 250

## 2023-10-20 MED ORDER — OXYCODONE-ACETAMINOPHEN 5-325 MG PO TABS
1.0000 | ORAL_TABLET | Freq: Four times a day (QID) | ORAL | Status: DC | PRN
  Administered 2023-10-20 – 2023-10-26 (×14): 1 via ORAL
  Filled 2023-10-20 (×14): qty 1

## 2023-10-20 MED ORDER — NITROGLYCERIN 0.4 MG SL SUBL: 0.4000 mg | SUBLINGUAL_TABLET | SUBLINGUAL | Status: DC | PRN

## 2023-10-20 MED ORDER — SODIUM CHLORIDE 0.9 % IV SOLN
2.0000 g | Freq: Once | INTRAVENOUS | Status: AC
  Administered 2023-10-20: 2 g via INTRAVENOUS
  Filled 2023-10-20: qty 20

## 2023-10-20 MED ORDER — FUROSEMIDE 10 MG/ML IJ SOLN
40.0000 mg | Freq: Two times a day (BID) | INTRAMUSCULAR | Status: DC
  Administered 2023-10-20: 40 mg via INTRAVENOUS
  Filled 2023-10-20: qty 4

## 2023-10-20 MED ORDER — ROSUVASTATIN CALCIUM 20 MG PO TABS
40.0000 mg | ORAL_TABLET | Freq: Every day | ORAL | Status: DC
  Administered 2023-10-21 – 2023-10-26 (×6): 40 mg via ORAL
  Filled 2023-10-20 (×6): qty 2

## 2023-10-20 MED ORDER — CHLORHEXIDINE GLUCONATE CLOTH 2 % EX PADS
6.0000 | MEDICATED_PAD | Freq: Every day | CUTANEOUS | Status: DC
  Administered 2023-10-20 – 2023-10-26 (×5): 6 via TOPICAL

## 2023-10-20 MED ORDER — TRAZODONE HCL 50 MG PO TABS
50.0000 mg | ORAL_TABLET | Freq: Every day | ORAL | Status: DC
  Administered 2023-10-20 – 2023-10-25 (×6): 50 mg via ORAL
  Filled 2023-10-20 (×6): qty 1

## 2023-10-20 MED ORDER — SODIUM CHLORIDE 0.9% FLUSH
3.0000 mL | Freq: Two times a day (BID) | INTRAVENOUS | Status: DC
  Administered 2023-10-20 – 2023-10-26 (×13): 3 mL via INTRAVENOUS

## 2023-10-20 MED ORDER — DULOXETINE HCL 20 MG PO CPEP
20.0000 mg | ORAL_CAPSULE | Freq: Every day | ORAL | Status: DC
  Administered 2023-10-20 – 2023-10-25 (×6): 20 mg via ORAL
  Filled 2023-10-20 (×8): qty 1

## 2023-10-20 MED ORDER — ASPIRIN 81 MG PO TBEC
81.0000 mg | DELAYED_RELEASE_TABLET | Freq: Every day | ORAL | Status: DC
  Administered 2023-10-21 – 2023-10-26 (×6): 81 mg via ORAL
  Filled 2023-10-20 (×6): qty 1

## 2023-10-20 MED ORDER — PANTOPRAZOLE SODIUM 40 MG PO TBEC
40.0000 mg | DELAYED_RELEASE_TABLET | Freq: Every day | ORAL | Status: DC
  Administered 2023-10-21 – 2023-10-26 (×6): 40 mg via ORAL
  Filled 2023-10-20 (×6): qty 1

## 2023-10-20 MED ORDER — CLOPIDOGREL BISULFATE 75 MG PO TABS
75.0000 mg | ORAL_TABLET | Freq: Every day | ORAL | Status: DC
  Administered 2023-10-21 – 2023-10-26 (×6): 75 mg via ORAL
  Filled 2023-10-20 (×6): qty 1

## 2023-10-20 MED ORDER — SODIUM CHLORIDE 0.9% FLUSH: 3.0000 mL | INTRAVENOUS | Status: DC | PRN

## 2023-10-20 NOTE — ED Provider Notes (Signed)
 [place] [facility] DE[facility] AT [facility] Provider Note   CSN: [id number] Arrival date & time: 10/20/23  1029     History {Add pertinent medical, surgical, social history, OB history to HPI:1} Chief Complaint  Patient presents with   Respiratory Distress    [name] is a 73 y.o. female.  Pt complains of sob.  Pt has a history of cad   Shortness of Breath      Home Medications Prior to Admission medications   Medication Sig Start Date End Date Taking? Authorizing Provider  albuterol (VENTOLIN HFA) 108 (90 Base) MCG/ACT inhaler 1 puff 4 (four) times daily as needed for wheezing or shortness of breath. 04/22/21   [provider]  ALPRAZolam Prudy Feeler) 0.5 MG tablet Take 0.5 mg by mouth 3 (three) times daily as needed. 11/14/22   [provider]  amLODipine (NORVASC) 10 MG tablet Take 1 tablet (10 mg total) by mouth daily. 11/21/22   Osvaldo Shipper, MD  aspirin 81 MG EC tablet Take 81 mg by mouth daily.    [provider]  clopidogrel (PLAVIX) 75 MG tablet Take 1 tablet (75 mg total) by mouth daily. 12/25/22   Jonelle Sidle, MD  diclofenac Sodium (VOLTAREN) 1 % GEL Apply 2 g topically 3 (three) times daily.    [provider]  diphenhydrAMINE (BENADRYL) 25 MG tablet Take 25 mg by mouth every 6 (six) hours as needed for allergies.    [provider]  DULoxetine (CYMBALTA) 20 MG capsule Take 20 mg by mouth at bedtime. 05/28/21   [provider]  EPINEPHrine (EPIPEN JR) 0.15 MG/0.3ML injection Inject 0.15 mg into the muscle as needed for anaphylaxis.    [provider]  isosorbide mononitrate (IMDUR) 30 MG 24 hr tablet Take 60 mg by mouth daily. 06/18/23   [provider]  isosorbide mononitrate (IMDUR) 60 MG 24 hr tablet Take 1 tablet (60 mg total) by mouth daily. Patient not taking: Reported on 06/29/2023 11/21/22   Osvaldo Shipper, MD  levothyroxine (SYNTHROID) 25 MCG tablet Take 25 mcg by  mouth daily before breakfast. 02/16/20   [provider]  metoprolol succinate (TOPROL-XL) 100 MG 24 hr tablet Take 1 tablet (100 mg total) by mouth daily. Take with or immediately following a meal. 11/21/22   Osvaldo Shipper, MD  nitroGLYCERIN (NITROSTAT) 0.4 MG SL tablet Place 1 tablet (0.4 mg total) under the tongue every 5 (five) minutes x 3 doses as needed for chest pain (if no relief after 2nd dose, proceed to ED or call 911). 06/08/23   Jonelle Sidle, MD  ondansetron (ZOFRAN) 4 MG tablet Take 4 mg by mouth 3 (three) times daily as needed for nausea. 06/03/21   [provider]  oxyCODONE-acetaminophen (PERCOCET) 5-325 MG tablet Take 1 tablet by mouth every 6 (six) hours as needed for severe pain (pain score 7-10). 07/05/23 07/04/24  Emilie Rutter, PA-C  pantoprazole (PROTONIX) 40 MG tablet Take 40 mg by mouth daily. 10/08/22   [provider]  rosuvastatin (CRESTOR) 40 MG tablet Take 1 tablet (40 mg total) by mouth daily. 07/03/23   Jonelle Sidle, MD  sertraline (ZOLOFT) 50 MG tablet Take 50 mg by mouth daily. 05/28/21   [provider]  traZODone (DESYREL) 50 MG tablet Take 50 mg by mouth at bedtime. 06/18/23   [provider]  vitamin B-12 (CYANOCOBALAMIN) 1000 MCG tablet Take 2 tablets (2,000 mcg total) by mouth daily. Patient taking differently: Take 1,000 mcg  by mouth daily. 03/13/20   Glade Lloyd, MD  Vitamin D, Ergocalciferol, (DRISDOL) 50000 UNITS CAPS Take 50,000 Units by mouth every Monday.     [provider]      Allergies    Celecoxib, Citalopram, Morphine and codeine, Ciprofloxacin hcl, Dicyclomine, Lamotrigine, Metoclopramide, Milnacipran hcl, Omeprazole, Pregabalin, Serotonin reuptake inhibitors (ssris), Simvastatin, Tizanidine, Trazodone and nefazodone, Venlafaxine, Codeine, Nefazodone, Tape, and Sertraline hcl    Review of Systems   Review of Systems  Respiratory:  Positive for shortness of breath.      Physical Exam Updated Vital Signs BP (!) 116/59   Pulse 98   Temp 98.9 F (37.2 C) (Oral)   Resp 13   Ht 4\' 11"  (1.499 m)   Wt 68.3 kg   SpO2 100%   BMI 30.42 kg/m  Physical Exam  ED Results / Procedures / Treatments   Labs (all labs ordered are listed, but only abnormal results are displayed) Labs Reviewed  CBC WITH DIFFERENTIAL/PLATELET - Abnormal; Notable for the following components:      Result Value   RBC 3.38 (*)    Hemoglobin 10.3 (*)    HCT 33.2 (*)    RDW 15.8 (*)    nRBC 0.9 (*)    All other components within normal limits  COMPREHENSIVE METABOLIC PANEL WITH GFR - Abnormal; Notable for the following components:   Sodium 133 (*)    Chloride 97 (*)    Glucose, Bld 115 (*)    BUN 29 (*)    Creatinine, Ser 1.28 (*)    Albumin 3.4 (*)    GFR, Estimated 44 (*)    All other components within normal limits  BRAIN NATRIURETIC PEPTIDE - Abnormal; Notable for the following components:   B Natriuretic Peptide 1,467.0 (*)    All other components within normal limits  TROPONIN I (HIGH SENSITIVITY) - Abnormal; Notable for the following components:   Troponin I (High Sensitivity) 46 (*)    All other components within normal limits  CULTURE, BLOOD (ROUTINE X 2)  CULTURE, BLOOD (ROUTINE X 2)  RESP PANEL BY RT-PCR (RSV, FLU A&B, COVID)  RVPGX2  LACTIC ACID, PLASMA  BLOOD GAS, VENOUS  PROTIME-INR  LACTIC ACID, PLASMA  URINALYSIS, W/ REFLEX TO CULTURE (INFECTION SUSPECTED)    EKG EKG Interpretation Date/Time:  Tuesday October 20 2023 10:49:51 EDT Ventricular Rate:  69 PR Interval:  156 QRS Duration:  71 QT Interval:  435 QTC Calculation: 466 R Axis:   21  Text Interpretation: Sinus rhythm Probable left atrial enlargement Repol abnrm suggests ischemia, anterolateral Confirmed by Bethann Berkshire [phone]) on 10/20/2023 11:59:00 AM  Radiology DG Chest Port 1 View Result Date: 10/20/2023 CLINICAL DATA:  Shortness of breath. EXAM: PORTABLE CHEST 1 VIEW COMPARISON:   July 27, 2023. FINDINGS: Stable cardiomegaly with probable central pulmonary vascular congestion. Bilateral interstitial densities are noted most consistent with pulmonary edema with small pleural effusions. Bony thorax is unremarkable. IMPRESSION: Stable cardiomegaly with probable central pulmonary vascular congestion and bilateral pulmonary edema. Small pleural effusions are noted as well. Electronically Signed   By: Lupita Raider M.D.   On: 10/20/2023 11:35    Procedures Procedures  {Document cardiac monitor, telemetry assessment procedure when appropriate:1}  Medications Ordered in ED Medications  albuterol (VENTOLIN HFA) 108 (90 Base) MCG/ACT inhaler 2 puff (has no administration in time range)  cefTRIAXone (ROCEPHIN) 2 g in sodium chloride 0.9 % 100 mL IVPB (2 g Intravenous New Bag/Given 10/20/23 1137)  furosemide (LASIX) injection 40  mg (40 mg Intravenous Given 10/20/23 1200)    ED Course/ Medical Decision Making/ A&P   CRITICAL CARE Performed by: Bethann Berkshire Total critical care time: 45 minutes Critical care time was exclusive of separately billable procedures and treating other patients. Critical care was necessary to treat or prevent imminent or life-threatening deterioration. Critical care was time spent personally by me on the following activities: development of treatment plan with patient and/or surrogate as well as nursing, discussions with consultants, evaluation of patient's response to treatment, examination of patient, obtaining history from patient or surrogate, ordering and performing treatments and interventions, ordering and review of laboratory studies, ordering and review of radiographic studies, pulse oximetry and re-evaluation of patient's condition.  Click here for ABCD2, HEART and other calculatorsREFRESH Note before signing :1}                              Medical Decision Making Amount and/or Complexity of Data Reviewed Labs: ordered. Radiology:  ordered.  Risk Prescription drug management. Decision regarding hospitalization.   Pt with chf.  Hospitalist admission and cardiology consult  {Document critical care time when appropriate:1} {Document review of labs and clinical decision tools ie heart score, Chads2Vasc2 etc:1}  {Document your independent review of radiology images, and any outside records:1} {Document your discussion with family members, caretakers, and with consultants:1} {Document social determinants of health affecting pt's care:1} {Document your decision making why or why not admission, treatments were needed:1} Final Clinical Impression(s) / ED Diagnoses Final diagnoses:  Systolic congestive heart failure, unspecified HF chronicity (HCC)    Rx / DC Orders ED Discharge Orders     None

## 2023-10-20 NOTE — Sepsis Progress Note (Signed)
 Original times of blood culture collection is 1125/1127

## 2023-10-20 NOTE — Sepsis Progress Note (Signed)
 Notified provider and bedside nurse of need to order and administer fluid bolus needs 2049 cc fluid.

## 2023-10-20 NOTE — TOC Initial Note (Signed)
 Transition of Care [facility]) - Initial/Assessment Note    Patient Details  Name: [name] MRN: [id number] Date of Birth: Nov 01, 1950  [facility]) CM/SW Contact:    Barron Alvine, RN Phone Number: 10/20/2023, 8:18 PM  Clinical Narrative:   Pt admitted c/acute hypoxemic respiratory failure. From [place] and plans to return there at dc. TOC to monitor for needs.   Expected Discharge Plan: Long Term Nursing Home Barriers to Discharge: Continued Medical Work up   Patient Goals and CMS Choice   CMS Medicare.gov Compare Post Acute Care list provided to:: Patient Choice offered to / list presented to : Patient [place] ownership interest in [facility]provided to:: Patient    Expected Discharge Plan and Services In-house Referral: Clinical Social Work Discharge Planning Services: CM Consult   Living arrangements for the past 2 months: Assisted Living Facility                 Prior Living Arrangements/Services Living arrangements for the past 2 months: Assisted Living Facility Lives with:: Facility Resident   Do you feel safe going back to the place where you live?: Yes      Need for Family Participation in Patient Care: No (Comment) Care giver support system in place?: Yes (comment)   Criminal Activity/Legal Involvement Pertinent to Current Situation/Hospitalization: No - Comment as needed  Activities of Daily Living   ADL Screening (condition at time of admission) Independently performs ADLs?: Yes (appropriate for developmental age) Is the patient deaf or have difficulty hearing?: No Does the patient have difficulty seeing, even when wearing glasses/contacts?: No Does the patient have difficulty concentrating, remembering, or making decisions?: No  Emotional Assessment   Attitude/Demeanor/Rapport: Engaged Affect (typically observed): Appropriate Orientation: : Oriented to Self, Oriented to Place, Oriented to  Time, Oriented to  Situation Alcohol / Substance Use: Not Applicable Psych Involvement: No (comment)  Admission diagnosis:  Acute hypoxemic respiratory failure (HCC) [J96.01] Systolic congestive heart failure, unspecified HF chronicity (HCC) [I50.20] Patient Active Problem List   Diagnosis Date Noted   Acute hypoxemic respiratory failure (HCC) 10/20/2023   Status post carotid endarterectomy 07/03/2023   Symptomatic carotid artery stenosis, right 07/03/2023   Anxiety state 06/10/2023   Relational problem 06/10/2023   Acute coronary syndrome with high troponin (HCC) 11/20/2022   Chronic kidney disease stage IIIb 11/18/2022   Obesity (BMI 30-39.9) 11/18/2022   Rheumatoid arthritis (HCC) 11/17/2022   Fibromyalgia 11/17/2022   Hypothyroidism 11/17/2022   Progressive angina with severe left main stenosis 11/16/2022   IPF (idiopathic pulmonary fibrosis) (HCC) 02/25/2022   Abdominal pain, epigastric 11/26/2021   Nausea without vomiting 11/26/2021   Constipation 11/26/2021   Tobacco abuse 11/16/2021   Abnormal chest CT 11/16/2021   Abnormal nuclear cardiac imaging test    Mixed incontinence 07/16/2021   History of UTI 07/16/2021   AMS (altered mental status) 03/11/2020   AKI (acute kidney injury) (HCC) 03/11/2020   Hypokalemia 03/11/2020   Altered mental status 03/11/2020   Depression    Prolonged QT interval    Unilateral primary osteoarthritis, right knee 02/22/2020   Gastroesophageal reflux disease without esophagitis 01/25/2018   Encounter for colonoscopy due to history of adenomatous colonic polyps 03/23/2017   Melena 01/31/2014   Aftercare following surgery of the circulatory system, NEC 01/30/2014   Tooth pain 05/03/2013   GERD (gastroesophageal reflux disease) 02/16/2013   Bloating 02/16/2013   Renal artery stenosis (HCC) 11/04/2012   PAD (peripheral artery disease) (HCC) 05/07/2012  Shortness of breath 04/14/2012   Carotid artery stenosis 11/13/2009   Hyperlipidemia 04/19/2009    Essential hypertension 03/12/2009   CAD (coronary artery disease) 03/12/2009   PCP:  Ignatius Specking, MD Pharmacy:   [facility] [place], [place] - [id number] [place] [address] Phone: [phone] Fax: [phone]  [facility] [phone] - [place], [place] - [address] AT [facility] [address] [place] [address] Phone: [phone] Fax: [phone]  [facility] [address] [place] [address] Phone: [phone] Fax: [phone]  Social Drivers of Health (SDOH) Social History:  Readmission Risk Interventions    10/20/2023    8:13 PM  Readmission Risk Prevention Plan  Transportation Screening Complete  PCP or Specialist Appt within 3-5 Days Complete  HRI or Home Care Consult Complete  Social Work Consult for Recovery Care Planning/Counseling Complete  Palliative Care Screening Not Applicable  Medication Review Oceanographer) Complete

## 2023-10-20 NOTE — H&P (Signed)
 History and Physical    [name]:[id number] DOB: 1951/01/14 DOA: 10/20/2023  PCP: Ignatius Specking, MD   Patient coming from: Home/ALF  Chief Complaint: Headache, shortness of breath  HPI: ROBBYN [name] is a 73 y.o. female with medical history significant for RA, IPF, CAD, PVD, carotid endarterectomy, hypertension, dyslipidemia, hypothyroidism, and fibromyalgia who presented to the ED with complaints of headache, weakness, nausea, and dizziness that have been ongoing along with some shortness of breath.  She cannot tell me how long this has been going on.  EMS was called and she was noted to have significant hypoxemia at 54% on room air.  She was placed on 10 L nasal cannula with improvement.  She denies any chest pain, fevers, or chills.  She seems preoccupied with her headache stating that she might have a sinus issue.   ED Course: Vital signs stable and patient afebrile.  She is now requiring 15 L high flow nasal cannula oxygen.  Hemoglobin 10.3 and sodium 133 with troponin of 46 and BNP 1467.  Creatinine 1.28 at baseline.  Chest x-ray showing signs of volume overload and patient has been started on IV Lasix.  EDP spoke with cardiology who plans to further evaluate as well.  Review of Systems: Reviewed as noted above, otherwise negative.  Past Medical History:  Diagnosis Date   Abnormal chest CT    Anxiety    Arthritis    Rhumatoid and Osteoarthritis   Carotid artery disease (HCC)    Dr. Myra Gianotti, s/p left CEA, severe R ICA stenosis   Coronary atherosclerosis of native coronary artery    DES RCA/circumflex 4/10, LVEF 65 -> repeat cath 11/2021 with multivessel CAD including LM stenosis, initial med rx   Depression    Essential hypertension    Fibromyalgia    GERD (gastroesophageal reflux disease)    Hiatal hernia    Hyperlipidemia    Kidney cysts    NSTEMI (non-ST elevated myocardial infarction) (HCC)    4/10   Renal artery stenosis (HCC)    Left renal artery stent 12/13    Restless leg syndrome    Sleep apnea    Spondylosis without myelopathy    Tobacco abuse     Past Surgical History:  Procedure Laterality Date   ABDOMINAL HYSTERECTOMY  1977   BREAST LUMPECTOMY     CAROTID ENDARTERECTOMY Left 02/28/2009   CHOLECYSTECTOMY  1991   COLONOSCOPY     COLONOSCOPY N/A 05/08/2017   Procedure: COLONOSCOPY;  Surgeon: Malissa Hippo, MD;  Location: [facility];  Service: Endoscopy;  Laterality: N/A;  12:00   CORONARY ATHERECTOMY N/A 11/19/2022   Procedure: CORONARY ATHERECTOMY;  Surgeon: Iran Ouch, MD;  Location: [facility];  Service: Cardiovascular;  Laterality: N/A;   CORONARY STENT INTERVENTION N/A 11/19/2022   Procedure: CORONARY STENT INTERVENTION;  Surgeon: Iran Ouch, MD;  Location: [facility];  Service: Cardiovascular;  Laterality: N/A;   CORONARY ULTRASOUND/IVUS N/A 11/19/2022   Procedure: Coronary Ultrasound/IVUS;  Surgeon: Iran Ouch, MD;  Location: [facility];  Service: Cardiovascular;  Laterality: N/A;   ENDARTERECTOMY Right 07/03/2023   Procedure: ENDARTERECTOMY CAROTID WITH XENOSURE BIOLOGIC PATCH ANGIOPLASTY;  Surgeon: Nada Libman, MD;  Location: [facility];  Service: Vascular;  Laterality: Right;   ESOPHAGOGASTRODUODENOSCOPY N/A 04/07/2013   Procedure: ESOPHAGOGASTRODUODENOSCOPY (EGD);  Surgeon: Malissa Hippo, MD;  Location: [facility];  Service: Endoscopy;  Laterality: N/A;  250   ESOPHAGOGASTRODUODENOSCOPY N/A 02/02/2014  Procedure: ESOPHAGOGASTRODUODENOSCOPY (EGD);  Surgeon: Malissa Hippo, MD;  Location: [facility];  Service: Endoscopy;  Laterality: N/A;  120   EYE SURGERY     Catartact bilateral   INSERTION OF RETROGRADE CAROTID STENT Right 07/03/2023   Procedure: Right common carotid angiogram;  Surgeon: Nada Libman, MD;  Location: [facility] OR;  Service: Vascular;  Laterality: Right;   JOINT REPLACEMENT  March 2013   Left knee   JOINT REPLACEMENT  2006   Left Knee   kidney stent     Left  December 2013   LEFT HEART CATH AND CORONARY ANGIOGRAPHY N/A 11/13/2021   Procedure: LEFT HEART CATH AND CORONARY ANGIOGRAPHY;  Surgeon: Iran Ouch, MD;  [facility]: [facility];  Service: Cardiovascular;  Laterality: N/A;   LEFT HEART CATH AND CORONARY ANGIOGRAPHY N/A 11/18/2022   Procedure: LEFT HEART CATH AND CORONARY ANGIOGRAPHY;  Surgeon: Marykay Lex, MD;  Location: [facility] INVASIVE CV LAB;  Service: Cardiovascular;  Laterality: N/A;   LUMBAR DISC SURGERY     L5-S1   MULTIPLE TOOTH EXTRACTIONS  Jan. 2015   Neck Fusion  1996   C4 to C6   PERCUTANEOUS STENT INTERVENTION Left 06/15/2012   Procedure: PERCUTANEOUS STENT INTERVENTION;  Surgeon: Nada Libman, MD;  Location: [facility] CATH LAB;  Service: Cardiovascular;  Laterality: Left;  renal   PERIPHERAL VASCULAR BALLOON ANGIOPLASTY  05/26/2017   Procedure: PERIPHERAL VASCULAR BALLOON ANGIOPLASTY;  Surgeon: Nada Libman, MD;  Location: [facility];  Service: Cardiovascular;;  Lt. Renal Angioplasty   POLYPECTOMY  05/08/2017   Procedure: POLYPECTOMY;  Surgeon: Malissa Hippo, MD;  Location: [facility];  Service: Endoscopy;;  colon    RENAL ANGIOGRAM N/A 06/15/2012   Procedure: RENAL ANGIOGRAM;  Surgeon: Nada Libman, MD;  Location: [facility] CATH LAB;  Service: Cardiovascular;  Laterality: N/A;   RENAL ANGIOGRAPHY  05/26/2017   Procedure: RENAL ANGIOGRAPHY;  Surgeon: Nada Libman, MD;  Location: [facility];  Service: Cardiovascular;;   SPINE SURGERY  1994   Ruptured disc   TEMPORARY PACEMAKER N/A 11/19/2022   Procedure: TEMPORARY PACEMAKER;  Surgeon: Iran Ouch, MD;  Location: [facility];  Service: Cardiovascular;  Laterality: N/A;   TONSILECTOMY, ADENOIDECTOMY, BILATERAL MYRINGOTOMY AND TUBES  1969   TOTAL KNEE ARTHROPLASTY     Left   TUBAL LIGATION       reports that she quit smoking about 20 months ago. Her smoking use included cigarettes. She started smoking about 46 years ago. She has never  used smokeless tobacco. She reports that she does not drink alcohol and does not use drugs.  Allergies  Allergen Reactions   Celecoxib Itching and Other (See Comments)    Other Reaction(s): Not available   Citalopram Other (See Comments) and Palpitations    Heart rate increased   Morphine And Codeine Other (See Comments) and Itching    Patient states she is not allergic to morphine; states had morphine with no reaction   Ciprofloxacin Hcl Other (See Comments)    Mya have made her sick or bad diarrhea   Dicyclomine Diarrhea and Other (See Comments)    Made diarrhea worse   Lamotrigine Other (See Comments) and Diarrhea    Unknown   Metoclopramide Other (See Comments)    Dizziness   Milnacipran Hcl Other (See Comments)    Unknown   Omeprazole Other (See Comments)   Pregabalin Other (See Comments)   Serotonin Reuptake Inhibitors (Ssris)  Other (See Comments)   Simvastatin Other (See Comments)    Increased back and muscle pain   Tizanidine Other (See Comments)    unknown   Trazodone And Nefazodone Other (See Comments)    hallucinations   Venlafaxine Other (See Comments)    Affected eyes, blurred vision   Codeine Itching   Nefazodone Other (See Comments)   Tape Other (See Comments)    Skin gets red and skin pulls off, paper tape only   Sertraline Hcl Other (See Comments)    Stomach upset. Pt takes this med at home 11/2022    Family History  Problem Relation Age of Onset   Other Mother        Cerebral hemorrage   Heart disease Mother    Heart attack Mother    Hyperlipidemia Mother    Heart disease Father    Heart attack Father    Hyperlipidemia Father    Heart disease Brother        Heart Disease before age 37   Diabetes Brother    Hypertension Brother    Heart attack Brother    Cancer Sister    Arthritis Sister    Heart disease Sister    Heart disease Daughter 28       Heart Disease before age 81   Heart attack Daughter    Hypertension Daughter    Cancer Sister     Arthritis Sister    Diabetes Brother        Varicose  Veins   Peripheral vascular disease Brother    Diabetes Brother     Prior to Admission medications   Medication Sig Start Date End Date Taking? Authorizing Provider  albuterol (VENTOLIN HFA) 108 (90 Base) MCG/ACT inhaler 1 puff 4 (four) times daily as needed for wheezing or shortness of breath. 04/22/21   [provider]  ALPRAZolam Prudy Feeler) 0.5 MG tablet Take 0.5 mg by mouth 3 (three) times daily as needed. 11/14/22   [provider]  amLODipine (NORVASC) 10 MG tablet Take 1 tablet (10 mg total) by mouth daily. 11/21/22   Osvaldo Shipper, MD  aspirin 81 MG EC tablet Take 81 mg by mouth daily.    [provider]  clopidogrel (PLAVIX) 75 MG tablet Take 1 tablet (75 mg total) by mouth daily. 12/25/22   Jonelle Sidle, MD  diclofenac Sodium (VOLTAREN) 1 % GEL Apply 2 g topically 3 (three) times daily.    [provider]  diphenhydrAMINE (BENADRYL) 25 MG tablet Take 25 mg by mouth every 6 (six) hours as needed for allergies.    [provider]  DULoxetine (CYMBALTA) 20 MG capsule Take 20 mg by mouth at bedtime. 05/28/21   [provider]  EPINEPHrine (EPIPEN JR) 0.15 MG/0.3ML injection Inject 0.15 mg into the muscle as needed for anaphylaxis.    [provider]  isosorbide mononitrate (IMDUR) 30 MG 24 hr tablet Take 60 mg by mouth daily. 06/18/23   [provider]  isosorbide mononitrate (IMDUR) 60 MG 24 hr tablet Take 1 tablet (60 mg total) by mouth daily. Patient not taking: Reported on 06/29/2023 11/21/22   Osvaldo Shipper, MD  levothyroxine (SYNTHROID) 25 MCG tablet Take 25 mcg by mouth daily before breakfast. 02/16/20   [provider]  metoprolol succinate (TOPROL-XL) 100 MG 24 hr tablet Take 1 tablet (100 mg total) by mouth daily. Take with or immediately following a meal. 11/21/22   Osvaldo Shipper, MD  nitroGLYCERIN (NITROSTAT) 0.4 MG SL tablet  Place 1 tablet  (0.4 mg total) under the tongue every 5 (five) minutes x 3 doses as needed for chest pain (if no relief after 2nd dose, proceed to ED or call 911). 06/08/23   Jonelle Sidle, MD  ondansetron (ZOFRAN) 4 MG tablet Take 4 mg by mouth 3 (three) times daily as needed for nausea. 06/03/21   [provider]  oxyCODONE-acetaminophen (PERCOCET) 5-325 MG tablet Take 1 tablet by mouth every 6 (six) hours as needed for severe pain (pain score 7-10). 07/05/23 07/04/24  Emilie Rutter, PA-C  pantoprazole (PROTONIX) 40 MG tablet Take 40 mg by mouth daily. 10/08/22   [provider]  rosuvastatin (CRESTOR) 40 MG tablet Take 1 tablet (40 mg total) by mouth daily. 07/03/23   Jonelle Sidle, MD  sertraline (ZOLOFT) 50 MG tablet Take 50 mg by mouth daily. 05/28/21   [provider]  traZODone (DESYREL) 50 MG tablet Take 50 mg by mouth at bedtime. 06/18/23   [provider]  vitamin B-12 (CYANOCOBALAMIN) 1000 MCG tablet Take 2 tablets (2,000 mcg total) by mouth daily. Patient taking differently: Take 1,000 mcg by mouth daily. 03/13/20   Glade Lloyd, MD  Vitamin D, Ergocalciferol, (DRISDOL) 50000 UNITS CAPS Take 50,000 Units by mouth every Monday.     [provider]    Physical Exam: Vitals:   10/20/23 1039 10/20/23 1042 10/20/23 1200  BP:   (!) 116/59  Pulse: 71  98  Resp:   13  Temp: 98.9 F (37.2 C)    TempSrc: Oral    SpO2: (!) 61%  100%  Weight:  68.3 kg   Height:  4\' 11"  (1.499 m)     Constitutional: NAD, calm, comfortable Vitals:   10/20/23 1039 10/20/23 1042 10/20/23 1200  BP:   (!) 116/59  Pulse: 71  98  Resp:   13  Temp: 98.9 F (37.2 C)    TempSrc: Oral    SpO2: (!) 61%  100%  Weight:  68.3 kg   Height:  4\' 11"  (1.499 m)    Eyes: lids and conjunctivae normal Neck: normal, supple Respiratory: clear to auscultation bilaterally. Normal respiratory effort. No accessory muscle use.  15 L high flow nasal cannula oxygen. Cardiovascular:  Regular rate and rhythm, no murmurs. Abdomen: no tenderness, no distention. Bowel sounds positive.  Musculoskeletal:  No edema. Skin: no rashes, lesions, ulcers.  Psychiatric: Flat affect  Labs on Admission: I have personally reviewed following labs and imaging studies  CBC: Recent Labs  Lab 10/20/23 1125  WBC 8.1  NEUTROABS 6.7  HGB 10.3*  HCT 33.2*  MCV 98.2  PLT 181   Basic Metabolic Panel: Recent Labs  Lab 10/20/23 1125  NA 133*  K 4.7  CL 97*  CO2 24  GLUCOSE 115*  BUN 29*  CREATININE 1.28*  CALCIUM 9.2   GFR: Estimated Creatinine Clearance: 32.4 mL/min (A) (by C-G formula based on SCr of 1.28 mg/dL (H)). Liver Function Tests: Recent Labs  Lab 10/20/23 1125  AST 23  ALT 16  ALKPHOS 70  BILITOT 1.1  PROT 7.1  ALBUMIN 3.4*   No results for input(s): "LIPASE", "AMYLASE" in the last 168 hours. No results for input(s): "AMMONIA" in the last 168 hours. Coagulation Profile: Recent Labs  Lab 10/20/23 1125  INR 1.1   Cardiac Enzymes: No results for input(s): "CKTOTAL", "CKMB", "CKMBINDEX", "TROPONINI" in the last 168 hours. BNP (last 3 results) No results for input(s): "PROBNP" in the last 8760 hours. HbA1C: No  results for input(s): "HGBA1C" in the last 72 hours. CBG: No results for input(s): "GLUCAP" in the last 168 hours. Lipid Profile: No results for input(s): "CHOL", "HDL", "LDLCALC", "TRIG", "CHOLHDL", "LDLDIRECT" in the last 72 hours. Thyroid Function Tests: No results for input(s): "TSH", "T4TOTAL", "FREET4", "T3FREE", "THYROIDAB" in the last 72 hours. Anemia Panel: No results for input(s): "VITAMINB12", "FOLATE", "FERRITIN", "TIBC", "IRON", "RETICCTPCT" in the last 72 hours. Urine analysis:    Component Value Date/Time   COLORURINE YELLOW 07/02/2023 1007   APPEARANCEUR CLEAR 07/02/2023 1007   APPEARANCEUR Clear 10/14/2021 1419   LABSPEC 1.020 07/02/2023 1007   PHURINE 5.0 07/02/2023 1007   GLUCOSEU NEGATIVE 07/02/2023 1007   HGBUR  NEGATIVE 07/02/2023 1007   BILIRUBINUR NEGATIVE 07/02/2023 1007   BILIRUBINUR Negative 10/14/2021 1419   KETONESUR NEGATIVE 07/02/2023 1007   PROTEINUR 100 (A) 07/02/2023 1007   UROBILINOGEN 0.2 02/06/2014 1816   NITRITE NEGATIVE 07/02/2023 1007   LEUKOCYTESUR NEGATIVE 07/02/2023 1007    Radiological Exams on Admission: DG Chest Port 1 View Result Date: 10/20/2023 CLINICAL DATA:  Shortness of breath. EXAM: PORTABLE CHEST 1 VIEW COMPARISON:  July 27, 2023. FINDINGS: Stable cardiomegaly with probable central pulmonary vascular congestion. Bilateral interstitial densities are noted most consistent with pulmonary edema with small pleural effusions. Bony thorax is unremarkable. IMPRESSION: Stable cardiomegaly with probable central pulmonary vascular congestion and bilateral pulmonary edema. Small pleural effusions are noted as well. Electronically Signed   By: Lupita Raider M.D.   On: 10/20/2023 11:35    EKG: Independently reviewed.  SR 69 bpm.  Assessment/Plan Principal Problem:   Acute hypoxemic respiratory failure (HCC) Active Problems:   Hyperlipidemia   Essential hypertension   CAD (coronary artery disease)   Carotid artery stenosis   PAD (peripheral artery disease) (HCC)   Gastroesophageal reflux disease without esophagitis   Depression   Rheumatoid arthritis (HCC)   Fibromyalgia   Hypothyroidism   Chronic kidney disease stage IIIb   Obesity (BMI 30-39.9)   Anxiety state    Acute hypoxemic respiratory failure secondary to HFpEF with troponin elevation -Currently on 15 L nasal cannula oxygen -BNP 1467, chest x-ray with signs of volume overload -Continue on IV Lasix 40 mg twice daily -Strict I's and O's and daily weights as well as Reds clip -Appreciate cardiology evaluation -Obtain 2D echocardiogram with prior 11/2022 with preserved LVEF 55-60% and grade 1 diastolic dysfunction -Wean oxygen as tolerated -Breathing treatments as needed  CKD stage IIIb -Currently at  baseline creatinine which is near 1.1-1.5  Mild hyponatremia -Likely related to hypervolemia -Continue to monitor with Lasix for diuresis  Anemia -Continue to monitor CBC, no overt bleeding noted -Check anemia panel  Essential hypertension -Hold home blood pressure agents given softer blood pressure readings -Monitor carefully with IV diuresis  Dyslipidemia -Continue statin  Hypothyroidism -Continue levothyroxine  Rheumatoid arthritis -Not currently active or on DMARD  Idiopathic pulmonary fibrosis -Likely related to rheumatoid arthritis  Fibromyalgia/anxiety/depression -Continue home medications  PAD/CAD with history of carotid endarterectomy -Continue aspirin, Plavix, and statin  GERD -Continue PPI  Obesity, class I -BMI 30.42   DVT prophylaxis: Heparin Code Status: Full Family Communication: None at bedside Disposition Plan: Heart failure evaluation and diuresis Consults called: Cardiology Admission status: Inpatient, stepdown  Severity of Illness: The appropriate patient status for this patient is INPATIENT. Inpatient status is judged to be reasonable and necessary in order to provide the required intensity of service to ensure the patient's safety. The patient's presenting symptoms, physical exam findings, and  initial radiographic and laboratory data in the context of their chronic comorbidities is felt to place them at high risk for further clinical deterioration. Furthermore, it is not anticipated that the patient will be medically stable for discharge from the hospital within 2 midnights of admission.   * I certify that at the point of admission it is my clinical judgment that the patient will require inpatient hospital care spanning beyond 2 midnights from the point of admission due to high intensity of service, high risk for further deterioration and high frequency of surveillance required.*   Annella Prowell D Vivien Barretto DO [facility]  If 7PM-7AM, please  contact night-coverage [email or web address]  10/20/2023, 12:45 PM

## 2023-10-20 NOTE — ED Triage Notes (Signed)
 Pt BIB ems for hypoxia initial oxygen reading 54% RA. Pt placed on 10L oxygen 98-99%. Pt complaining of headache, weakness, nausea, and dizziness "for awhile."

## 2023-10-20 NOTE — Sepsis Progress Note (Signed)
 Elink monitoring for the code sepsis protocol.

## 2023-10-20 NOTE — Consult Note (Signed)
 Cardiology Consultation   Patient ID: [name] MRN: [id number]; DOB: Jan 14, 1951  Admit date: 10/20/2023 Date of Consult: 10/20/2023  PCP:  Ignatius Specking, MD    [facility] Providers Cardiologist:  Nona Dell, MD}     Patient Profile:   [name] is a 73 y.o. female with a hx of severe CAD  who is being seen 10/20/2023 for the evaluation of CHF  at the request of Dr Estell Harpin.  History of Present Illness:   Ms. [name] is a 73 yo with hx of CAD  She is s/p DES to RCA and LCx in April 2010     May 2023 she underwent LHC which showed multivessel severe CAD with severe ostial LM dz; patient LCx with mild in-stent restenosis, patient RCA stents with 99% R PDA    There were L to R collaterals     She was not felt to be a surgical candidate due to calcified aorta and vascular dz of subclavian or intervention candidate      May 2024   Pt presented with [place]    LHC showed stable 90% LM stenosis with catheter damping; procreggsion of prox RCA dz.    (see below)   She underwent PTCA/stent to the LM on 11/19/22   She was last seen in cardiology by Sherlean Foot in Oct 2024 (televisit)  Denied CP at that time   The pt says that about 10 days ago she was doing good, feeling good   Actually ran some  Over the past few days she has noticed more SOB   She hasn't slept well because of this.   Also has had some heaviness in her chest  Also has felt her heart racing.    The pt called EMS   O2 sats on RA 54%  Placed on 10 L     Brought to [facility] ER    HGb 10.3  BNP 1467  Trop 46 Cr 1.28 CXR showed evid of volume overload  Pt given IV lasix    She is in the ICU and is breathing some better now     Past Medical History:  Diagnosis Date   Abnormal chest CT    Anxiety    Arthritis    Rhumatoid and Osteoarthritis   Carotid artery disease (HCC)    Dr. Myra Gianotti, s/p left CEA, severe R ICA stenosis   Coronary atherosclerosis of native coronary artery    DES RCA/circumflex 4/10, LVEF 65 ->  repeat cath 11/2021 with multivessel CAD including LM stenosis, initial med rx   Depression    Essential hypertension    Fibromyalgia    GERD (gastroesophageal reflux disease)    Hiatal hernia    Hyperlipidemia    Kidney cysts    NSTEMI (non-ST elevated myocardial infarction) (HCC)    4/10   Renal artery stenosis (HCC)    Left renal artery stent 12/13   Restless leg syndrome    Sleep apnea    Spondylosis without myelopathy    Tobacco abuse     Past Surgical History:  Procedure Laterality Date   ABDOMINAL HYSTERECTOMY  1977   BREAST LUMPECTOMY     CAROTID ENDARTERECTOMY Left 02/28/2009   CHOLECYSTECTOMY  1991   COLONOSCOPY     COLONOSCOPY N/A 05/08/2017   Procedure: COLONOSCOPY;  Surgeon: Malissa Hippo, MD;  [facility];  Service: Endoscopy;  Laterality: N/A;  12:00   CORONARY ATHERECTOMY N/A 11/19/2022   Procedure: CORONARY ATHERECTOMY;  Surgeon: Iran Ouch, MD;  Location: [facility] INVASIVE CV LAB;  Service: Cardiovascular;  Laterality: N/A;   CORONARY STENT INTERVENTION N/A 11/19/2022   Procedure: CORONARY STENT INTERVENTION;  Surgeon: Iran Ouch, MD;  Location: [facility] INVASIVE CV LAB;  Service: Cardiovascular;  Laterality: N/A;   CORONARY ULTRASOUND/IVUS N/A 11/19/2022   Procedure: Coronary Ultrasound/IVUS;  Surgeon: Iran Ouch, MD;  Location: [facility] INVASIVE CV LAB;  Service: Cardiovascular;  Laterality: N/A;   ENDARTERECTOMY Right 07/03/2023   Procedure: ENDARTERECTOMY CAROTID WITH XENOSURE BIOLOGIC PATCH ANGIOPLASTY;  Surgeon: Nada Libman, MD;  Location: [facility] OR;  Service: Vascular;  Laterality: Right;   ESOPHAGOGASTRODUODENOSCOPY N/A 04/07/2013   Procedure: ESOPHAGOGASTRODUODENOSCOPY (EGD);  Surgeon: Malissa Hippo, MD;  [facility]: [facility];  Service: Endoscopy;  Laterality: N/A;  250   ESOPHAGOGASTRODUODENOSCOPY N/A 02/02/2014   Procedure: ESOPHAGOGASTRODUODENOSCOPY (EGD);  Surgeon: Malissa Hippo, MD;  [facility];  Service: Endoscopy;   Laterality: N/A;  120   EYE SURGERY     Catartact bilateral   INSERTION OF RETROGRADE CAROTID STENT Right 07/03/2023   Procedure: Right common carotid angiogram;  Surgeon: Nada Libman, MD;  Location: [facility] OR;  Service: Vascular;  Laterality: Right;   JOINT REPLACEMENT  March 2013   Left knee   JOINT REPLACEMENT  2006   Left Knee   kidney stent     Left December 2013   LEFT HEART CATH AND CORONARY ANGIOGRAPHY N/A 11/13/2021   Procedure: LEFT HEART CATH AND CORONARY ANGIOGRAPHY;  Surgeon: Iran Ouch, MD;  [facility] CV LAB;  Service: Cardiovascular;  Laterality: N/A;   LEFT HEART CATH AND CORONARY ANGIOGRAPHY N/A 11/18/2022   Procedure: LEFT HEART CATH AND CORONARY ANGIOGRAPHY;  Surgeon: Marykay Lex, MD;  Location: [facility] INVASIVE CV LAB;  Service: Cardiovascular;  Laterality: N/A;   LUMBAR DISC SURGERY     L5-S1   MULTIPLE TOOTH EXTRACTIONS  Jan. 2015   Neck Fusion  1996   C4 to C6   PERCUTANEOUS STENT INTERVENTION Left 06/15/2012   Procedure: PERCUTANEOUS STENT INTERVENTION;  Surgeon: Nada Libman, MD;  Location: [facility] CATH LAB;  Service: Cardiovascular;  Laterality: Left;  renal   PERIPHERAL VASCULAR BALLOON ANGIOPLASTY  05/26/2017   Procedure: PERIPHERAL VASCULAR BALLOON ANGIOPLASTY;  Surgeon: Nada Libman, MD;  [facility]: [facility] INVAS[facility] CV LAB;  Service: Cardiovascular;;  Lt. Renal Angioplasty   POLYPECTOMY  05/08/2017   Procedure: POLYPECTOMY;  Surgeon: Malissa Hippo, MD;  [facility];  Service: Endoscopy;;  colon    RENAL ANGIOGRAM N/A 06/15/2012   Procedure: RENAL ANGIOGRAM;  Surgeon: Nada Libman, MD;  Location: [facility] CATH LAB;  Service: Cardiovascular;  Laterality: N/A;   RENAL ANGIOGRAPHY  05/26/2017   Procedure: RENAL ANGIOGRAPHY;  Surgeon: Nada Libman, MD;  Location: [facility] INVASIVE CV LAB;  Service: Cardiovascular;;   SPINE SURGERY  1994   Ruptured disc   TEMPORARY PACEMAKER N/A 11/19/2022   Procedure: TEMPORARY PACEMAKER;  Surgeon: Iran Ouch, MD;  Location: [facility]VAS[facility] CV LAB;  Service: Cardiovascular;  Laterality: N/A;   TONSILECTOMY, ADENOIDECTOMY, BILATERAL MYRINGOTOMY AND TUBES  1969   TOTAL KNEE ARTHROPLASTY     Left   TUBAL LIGATION       Home Medications:  Prior to Admission medications   Medication Sig Start Date End Date Taking? Authorizing Provider  albuterol (VENTOLIN HFA) 108 (90 Base) MCG/ACT inhaler 1 puff 4 (four) times daily as needed for wheezing or shortness of  breath. 04/22/21   [provider]  ALPRAZolam Prudy Feeler) 0.5 MG tablet Take 0.5 mg by mouth 3 (three) times daily as needed. 11/14/22   [provider]  amLODipine (NORVASC) 10 MG tablet Take 1 tablet (10 mg total) by mouth daily. 11/21/22   Osvaldo Shipper, MD  aspirin 81 MG EC tablet Take 81 mg by mouth daily.    [provider]  clopidogrel (PLAVIX) 75 MG tablet Take 1 tablet (75 mg total) by mouth daily. 12/25/22   Jonelle Sidle, MD  diclofenac Sodium (VOLTAREN) 1 % GEL Apply 2 g topically 3 (three) times daily.    [provider]  diphenhydrAMINE (BENADRYL) 25 MG tablet Take 25 mg by mouth every 6 (six) hours as needed for allergies.    [provider]  DULoxetine (CYMBALTA) 20 MG capsule Take 20 mg by mouth at bedtime. 05/28/21   [provider]  EPINEPHrine (EPIPEN JR) 0.15 MG/0.3ML injection Inject 0.15 mg into the muscle as needed for anaphylaxis.    [provider]  isosorbide mononitrate (IMDUR) 30 MG 24 hr tablet Take 60 mg by mouth daily. 06/18/23   [provider]  isosorbide mononitrate (IMDUR) 60 MG 24 hr tablet Take 1 tablet (60 mg total) by mouth daily. Patient not taking: Reported on 06/29/2023 11/21/22   Osvaldo Shipper, MD  levothyroxine (SYNTHROID) 25 MCG tablet Take 25 mcg by mouth daily before breakfast. 02/16/20   [provider]  metoprolol succinate (TOPROL-XL) 100 MG 24 hr tablet Take 1 tablet (100 mg total) by mouth daily. Take with or  immediately following a meal. 11/21/22   Osvaldo Shipper, MD  nitroGLYCERIN (NITROSTAT) 0.4 MG SL tablet Place 1 tablet (0.4 mg total) under the tongue every 5 (five) minutes x 3 doses as needed for chest pain (if no relief after 2nd dose, proceed to ED or call 911). 06/08/23   Jonelle Sidle, MD  ondansetron (ZOFRAN) 4 MG tablet Take 4 mg by mouth 3 (three) times daily as needed for nausea. 06/03/21   [provider]  oxyCODONE-acetaminophen (PERCOCET) 5-325 MG tablet Take 1 tablet by mouth every 6 (six) hours as needed for severe pain (pain score 7-10). 07/05/23 07/04/24  Emilie Rutter, PA-C  pantoprazole (PROTONIX) 40 MG tablet Take 40 mg by mouth daily. 10/08/22   [provider]  rosuvastatin (CRESTOR) 40 MG tablet Take 1 tablet (40 mg total) by mouth daily. 07/03/23   Jonelle Sidle, MD  sertraline (ZOLOFT) 50 MG tablet Take 50 mg by mouth daily. 05/28/21   [provider]  traZODone (DESYREL) 50 MG tablet Take 50 mg by mouth at bedtime. 06/18/23   [provider]  vitamin B-12 (CYANOCOBALAMIN) 1000 MCG tablet Take 2 tablets (2,000 mcg total) by mouth daily. Patient taking differently: Take 1,000 mcg by mouth daily. 03/13/20   Glade Lloyd, MD  Vitamin D, Ergocalciferol, (DRISDOL) 50000 UNITS CAPS Take 50,000 Units by mouth every Monday.     [provider]    Inpatient Medications: Scheduled Meds:  Chlorhexidine Gluconate Cloth  6 each Topical Daily   Continuous Infusions:  PRN Meds: albuterol  Allergies:    Allergies  Allergen Reactions   Celecoxib Itching and Other (See Comments)    Other Reaction(s): Not available   Citalopram Other (See Comments) and Palpitations    Heart rate increased   Morphine And Codeine Other (See Comments) and Itching    Patient states she is not allergic to morphine; states had morphine with  no reaction   Ciprofloxacin Hcl Other (See Comments)    Mya have made her sick or bad diarrhea    Dicyclomine Diarrhea and Other (See Comments)    Made diarrhea worse   Lamotrigine Other (See Comments) and Diarrhea    Unknown   Metoclopramide Other (See Comments)    Dizziness   Milnacipran Hcl Other (See Comments)    Unknown   Omeprazole Other (See Comments)   Pregabalin Other (See Comments)   Serotonin Reuptake Inhibitors (Ssris) Other (See Comments)   Simvastatin Other (See Comments)    Increased back and muscle pain   Tizanidine Other (See Comments)    unknown   Trazodone And Nefazodone Other (See Comments)    hallucinations   Venlafaxine Other (See Comments)    Affected eyes, blurred vision   Codeine Itching   Nefazodone Other (See Comments)   Tape Other (See Comments)    Skin gets red and skin pulls off, paper tape only   Sertraline Hcl Other (See Comments)    Stomach upset. Pt takes this med at home 11/2022    Social History:   Social History   Socioeconomic History   Marital status: Divorced    Spouse name: Not on file   Number of children: Not on file   Years of education: Not on file   Highest education level: Not on file  Occupational History   Not on file  Tobacco Use   Smoking status: Former    Current packs/day: 0.00    Types: Cigarettes    Start date: 02/18/1977    Quit date: 02/18/2022    Years since quitting: 1.6   Smokeless tobacco: Never  Vaping Use   Vaping status: Never Used  Substance and Sexual Activity   Alcohol use: No    Alcohol/week: 0.0 standard drinks of alcohol   Drug use: No   Sexual activity: Not on file  Other Topics Concern   Not on file  Social History Narrative   Not on file   Social Drivers of Health   Financial Resource Strain: Medium Risk (07/31/2023)   Received from [facility]   Overall Financial Resource Strain (CARDIA)    Difficulty of Paying Living Expenses: Somewhat hard  Food Insecurity: Food Insecurity Present (07/31/2023)   Received from [facility]   Hunger Vital Sign    Worried About Running Out  of Food in the Last Year: Sometimes true    Ran Out of Food in the Last Year: Not on file  Transportation Needs: Unmet Transportation Needs (07/31/2023)   Received from [facility]   PRAPARE - Transportation    Lack of Transportation (Medical): Yes    Lack of Transportation (Non-Medical): Not on file  Physical Activity: Not on file  Stress: Not on file  Social Connections: Unknown (05/13/2022)   Received from [facility]   Social Network    Social Network: Not on file  Intimate Partner Violence: Not At Risk (07/31/2023)   Received from [facility]   Humiliation, Afraid, Rape, and Kick questionnaire    Fear of Current or Ex-Partner: No    Emotionally Abused: No    Physically Abused: No    Sexually Abused: No    Family History:    Family History  Problem Relation Age of Onset   Other Mother        Cerebral hemorrage   Heart disease Mother    Heart attack Mother    Hyperlipidemia Mother  Heart disease Father    Heart attack Father    Hyperlipidemia Father    Heart disease Brother        Heart Disease before age 61   Diabetes Brother    Hypertension Brother    Heart attack Brother    Cancer Sister    Arthritis Sister    Heart disease Sister    Heart disease Daughter 28       Heart Disease before age 82   Heart attack Daughter    Hypertension Daughter    Cancer Sister    Arthritis Sister    Diabetes Brother        Varicose  Veins   Peripheral vascular disease Brother    Diabetes Brother      ROS:  Please see the history of present illness.   All other ROS reviewed and negative.     Physical Exam/Data:   Vitals:   10/20/23 1039 10/20/23 1042 10/20/23 1200  BP:   (!) 116/59  Pulse: 71  98  Resp:   13  Temp: 98.9 F (37.2 C)    TempSrc: Oral    SpO2: (!) 61%  100%  Weight:  68.3 kg   Height:  4\' 11"  (1.499 m)     Intake/Output Summary (Last 24 hours) at 10/20/2023 1232 Last data filed at 10/20/2023 1229 Gross per 24 hour  Intake  200 ml  Output --  Net 200 ml      10/20/2023   10:42 AM 07/03/2023    6:11 AM 07/02/2023    9:45 AM  Last 3 Weights  Weight (lbs) 150 lb 9.6 oz 146 lb 146 lb 6.4 oz  Weight (kg) 68.312 kg 66.225 kg 66.407 kg     Body mass index is 30.42 kg/m.  General:  Well nourished, well developed, in no acute distress HEENT: normal Neck: no JVD Vascular: No carotid bruits; Distal pulses 2+ bilaterally Cardiac:  normal S1, S2; RRR   No signifcant murmurs  Lungs:  Very mild rales  Abd: soft, nontender, no hepatomegaly  Ext: no LE edema  PT says feet feel hot   Warm to touch Skin: warm and dry  Neuro:  CNs 2-12 intact, no focal abnormalities noted Psych:  Normal affect   EKG:  The EKG was personally reviewed and demonstrates:  SR 69 bpm   ST Depression (downsloping V4-V6, I)  More pronounced than on previous EKGs  Telemetry:  Telemetry was personally reviewed and demonstrates:  SR  Relevant CV Studies:  PTCA/stent    Nov 19, 2022    Mid Cx lesion is 20% stenosed.   Mid Cx to Dist Cx lesion is 70% stenosed.   Prox Cx lesion is 60% stenosed.   Ost LM to Mid LM lesion is 90% stenosed.   A drug-eluting stent was successfully placed using a STENT MEGATRON 3.5X8.   Post intervention, there is a 10% residual stenosis.   Successful IVUS guided high risk rotational atherectomy and drug-eluting stent placement to the ostial left main coronary artery extending into the distal segment just before the bifurcation.  Difficult procedure overall due to heavy eccentric calcifications and short left main. The patient had chest pain and elevated blood pressure with balloon inflations and thus she was started on nitroglycerin drip with resolution of symptoms.   Recommendations: Dual antiplatelet therapy for at least 1 year. Aggressive treatment of risk factors. Remove venous sheath once ACT is below 200.   LHC   Nov 18, 2022  Ost LM to Mid LM lesion is 90% stenosed.   Mid Cx lesion is 20% stenosed.    Mid Cx to Dist Cx lesion is 70% stenosed.  Prox Cx lesion is 60% stenosed.   Ost RCA to Prox RCA lesion is 20% stenosed.   Prox RCA to Mid RCA lesion is 30% stenosed.   Prox RCA lesion is 60% stenosed.   RPDA lesion is 100% stenosed. fills via L-R collaterals   RPAV lesion is 60% stenosed.   LV end diastolic pressure is moderately elevated.   There is no aortic valve stenosis.   POST-CATH DIAGNOSES Relatively stable elliptical calcified 90% Left Main stenosis with significant catheter dampening during engagement pressures normalized the pressures in the 80s with significant anginal chest pain Progression of proximal RCA disease but otherwise stable disease elsewhere. Significant pressure gradient in the innominate this up to right subclavian artery with tandem lesions leading to a 40 to 50 mmHg gradient.   RECOMMENDATIONS Will discuss with ICU team determine best course of action, would likely plan unsupported left main PCI with either atherectomy versus shockwave lithotripsy and IVUS guidance. Would likely load with Plavix once the plan for PCI is made.      Laboratory Data:  High Sensitivity Troponin:   Recent Labs  Lab 10/20/23 1125  TROPONINIHS 46*     Chemistry Recent Labs  Lab 10/20/23 1125  NA 133*  K 4.7  CL 97*  CO2 24  GLUCOSE 115*  BUN 29*  CREATININE 1.28*  CALCIUM 9.2  GFRNONAA 44*  ANIONGAP 12    Recent Labs  Lab 10/20/23 1125  PROT 7.1  ALBUMIN 3.4*  AST 23  ALT 16  ALKPHOS 70  BILITOT 1.1   Lipids No results for input(s): "CHOL", "TRIG", "HDL", "LABVLDL", "LDLCALC", "CHOLHDL" in the last 168 hours.  Hematology Recent Labs  Lab 10/20/23 1125  WBC 8.1  RBC 3.38*  HGB 10.3*  HCT 33.2*  MCV 98.2  MCH 30.5  MCHC 31.0  RDW 15.8*  PLT 181   Thyroid No results for input(s): "TSH", "FREET4" in the last 168 hours.  BNP Recent Labs  Lab 10/20/23 1125  BNP 1,467.0*    DDimer No results for input(s): "DDIMER" in the last 168  hours.   Radiology/Studies:  DG Chest Port 1 View Result Date: 10/20/2023 CLINICAL DATA:  Shortness of breath. EXAM: PORTABLE CHEST 1 VIEW COMPARISON:  July 27, 2023. FINDINGS: Stable cardiomegaly with probable central pulmonary vascular congestion. Bilateral interstitial densities are noted most consistent with pulmonary edema with small pleural effusions. Bony thorax is unremarkable. IMPRESSION: Stable cardiomegaly with probable central pulmonary vascular congestion and bilateral pulmonary edema. Small pleural effusions are noted as well. Electronically Signed   By: Lupita Raider M.D.   On: 10/20/2023 11:35     Assessment and Plan:   1  CHF   Pt presented with a few day hx of SOB, chest pressure   She was hypoxic on arrival    BP was a little low earlier   Improved with midodrine  Oxygen has improved also though still on 9L  with marginal sats   Echo has been ordered  WOuld get in early am (will call) Check D DImer     2  Hx CAD  Severe   LM stent in May 2024   Trop with only minor elevation  If there had been problems would expect clinical course to be different. Follow closely    3  CV dz  Followed by Janae Bridgeman S/p endarterectomy R carotid in Dec 2024   Keep on ASA and Plavix  4  HTN   BP is low  On midodrine  5 HL    Continue statin   6  Renal   Cr 1.28  Follow while giving IV lasix   Na was 133 today  7  Hx IPF  6  Hx RA     For questions or updates, please contact [facility] Please consult [email or web address] for contact info under    Signed, Dietrich Pates, MD  10/20/2023 12:32 PM

## 2023-10-20 NOTE — Progress Notes (Signed)
 Date and time results received: 10/20/23 1528 (use smartphrase ".now" to insert current time)  Test: Troponin  Critical Value: 106  Name of Provider Notified: Dr Sherryll Burger and Norva Pavlov RN (Patient RN)  Orders Received? Or Actions Taken?:  No new orders currently

## 2023-10-20 NOTE — ED Notes (Signed)
 Pt placed immediately on high flow oxygen by respiratory. Oxygen saturation increased to 100% on high flow 15L.

## 2023-10-20 NOTE — ED Notes (Signed)
 Attempted I & O cath at this time no output at this time. Verbal from EDP place on pure wick while lasix given.

## 2023-10-21 ENCOUNTER — Inpatient Hospital Stay (HOSPITAL_COMMUNITY)

## 2023-10-21 ENCOUNTER — Other Ambulatory Visit (HOSPITAL_COMMUNITY): Payer: Self-pay | Admitting: *Deleted

## 2023-10-21 DIAGNOSIS — N1832 Chronic kidney disease, stage 3b: Secondary | ICD-10-CM

## 2023-10-21 DIAGNOSIS — I6523 Occlusion and stenosis of bilateral carotid arteries: Secondary | ICD-10-CM

## 2023-10-21 DIAGNOSIS — R7989 Other specified abnormal findings of blood chemistry: Secondary | ICD-10-CM

## 2023-10-21 DIAGNOSIS — M069 Rheumatoid arthritis, unspecified: Secondary | ICD-10-CM

## 2023-10-21 DIAGNOSIS — J9601 Acute respiratory failure with hypoxia: Secondary | ICD-10-CM

## 2023-10-21 DIAGNOSIS — E039 Hypothyroidism, unspecified: Secondary | ICD-10-CM

## 2023-10-21 DIAGNOSIS — I10 Essential (primary) hypertension: Secondary | ICD-10-CM

## 2023-10-21 DIAGNOSIS — I5031 Acute diastolic (congestive) heart failure: Secondary | ICD-10-CM

## 2023-10-21 DIAGNOSIS — N179 Acute kidney failure, unspecified: Secondary | ICD-10-CM

## 2023-10-21 DIAGNOSIS — I739 Peripheral vascular disease, unspecified: Secondary | ICD-10-CM | POA: Diagnosis not present

## 2023-10-21 DIAGNOSIS — E782 Mixed hyperlipidemia: Secondary | ICD-10-CM

## 2023-10-21 DIAGNOSIS — K219 Gastro-esophageal reflux disease without esophagitis: Secondary | ICD-10-CM

## 2023-10-21 LAB — HEPARIN LEVEL (UNFRACTIONATED): Heparin Unfractionated: 0.84 [IU]/mL — ABNORMAL HIGH (ref 0.30–0.70)

## 2023-10-21 LAB — ECHOCARDIOGRAM COMPLETE
Area-P 1/2: 4.89 cm2
Height: 59 in
S' Lateral: 2.4 cm
Weight: 2253.98 [oz_av]

## 2023-10-21 LAB — BASIC METABOLIC PANEL WITH GFR
Anion gap: 13 (ref 5–15)
BUN: 39 mg/dL — ABNORMAL HIGH (ref 8–23)
CO2: 28 mmol/L (ref 22–32)
Calcium: 9.1 mg/dL (ref 8.9–10.3)
Chloride: 97 mmol/L — ABNORMAL LOW (ref 98–111)
Creatinine, Ser: 1.81 mg/dL — ABNORMAL HIGH (ref 0.44–1.00)
GFR, Estimated: 29 mL/min — ABNORMAL LOW (ref >60)
Glucose, Bld: 108 mg/dL — ABNORMAL HIGH (ref 70–99)
Potassium: 3.4 mmol/L — ABNORMAL LOW (ref 3.5–5.1)
Sodium: 138 mmol/L (ref 135–145)

## 2023-10-21 LAB — MAGNESIUM: Magnesium: 1.8 mg/dL (ref 1.7–2.4)

## 2023-10-21 MED ORDER — HEPARIN (PORCINE) 25000 UT/250ML-% IV SOLN
700.0000 [IU]/h | INTRAVENOUS | Status: DC
  Administered 2023-10-21: 950 [IU]/h via INTRAVENOUS
  Filled 2023-10-21: qty 250

## 2023-10-21 MED ORDER — HEPARIN BOLUS VIA INFUSION
3000.0000 [IU] | Freq: Once | INTRAVENOUS | Status: AC
  Administered 2023-10-21: 3000 [IU] via INTRAVENOUS
  Filled 2023-10-21: qty 3000

## 2023-10-21 MED ORDER — MIDODRINE HCL 5 MG PO TABS
7.5000 mg | ORAL_TABLET | Freq: Three times a day (TID) | ORAL | Status: DC
  Administered 2023-10-21 – 2023-10-23 (×5): 7.5 mg via ORAL
  Filled 2023-10-21 (×5): qty 2

## 2023-10-21 NOTE — Progress Notes (Signed)
 Progress Note  Patient Name: [name] Date of Encounter: 10/21/2023  Primary Cardiologist: Nona Dell, MD  Interval Summary   Chart reviewed including cardiology consultation by Dr. Tenny Craw.  Patient states that she feels weak this morning, not specifically short of breath, no chest pain.  Admits to having had cough for the last few days and chest congestion.  Viral respiratory panel negative.  Echocardiogram pending.  Vital Signs    Vitals:   10/21/23 0920 10/21/23 0930 10/21/23 0945 10/21/23 1000  BP:  101/71 (!) 106/42 (!) 116/43  Pulse:      Resp:  14 19 20   Temp:      TempSrc:      SpO2: 94% 95% 93% 92%  Weight:      Height:        Intake/Output Summary (Last 24 hours) at 10/21/2023 1035 Last data filed at 10/21/2023 1000 Gross per 24 hour  Intake 773.01 ml  Output 2650 ml  Net -1876.99 ml   Filed Weights   10/20/23 1042 10/21/23 0500  Weight: 68.3 kg 63.9 kg    Physical Exam   GEN: No acute distress.   Neck: No JVD. Cardiac: RRR, 2.6 systolic murmur, rub, or gallop.  Respiratory: Nonlabored.  Mild expiratory wheeze. GI: Soft, nontender, bowel sounds present. MS: No edema.  ECG/Telemetry    Telemetry reviewed showing sinus rhythm.  Labs    Chemistry Recent Labs  Lab 10/20/23 1125 10/20/23 1255 10/21/23 0439  NA 133*  --  138  K 4.7  --  3.4*  CL 97*  --  97*  CO2 24  --  28  GLUCOSE 115*  --  108*  BUN 29*  --  39*  CREATININE 1.28* 1.25* 1.81*  CALCIUM 9.2  --  9.1  PROT 7.1  --   --   ALBUMIN 3.4*  --   --   AST 23  --   --   ALT 16  --   --   ALKPHOS 70  --   --   BILITOT 1.1  --   --   GFRNONAA 44* 45* 29*  ANIONGAP 12  --  13    Hematology Recent Labs  Lab 10/20/23 1125 10/20/23 1250 10/20/23 1255  WBC 8.1  --  7.8  RBC 3.38* 3.47* 3.55*  HGB 10.3*  --  10.6*  HCT 33.2*  --  34.3*  MCV 98.2  --  96.6  MCH 30.5  --  29.9  MCHC 31.0  --  30.9  RDW 15.8*  --  15.5  PLT 181  --  192   Cardiac Enzymes Recent Labs   Lab 10/20/23 1125 10/20/23 1255  TROPONINIHS 46* 106*   Lipid Panel     Component Value Date/Time   CHOL 106 07/04/2023 0330   TRIG 67 07/04/2023 0330   HDL 46 07/04/2023 0330   CHOLHDL 2.3 07/04/2023 0330   VLDL 13 07/04/2023 0330   LDLCALC 47 07/04/2023 0330    Cardiac Studies   Echocardiogram pending.  Assessment & Plan   1.  Presentation with shortness of breath and acute hypoxic respiratory failure, viral respiratory panel negative despite recent cough.  HFpEF suspected to be at least component.  D-dimer 1.23, BNP 1467.  High-sensitivity troponin I levels do not suggest ACS.  2.  Possible HFpEF exacerbation, LVEF 55 to 60% as of May 2024.  Follow-up echocardiogram pending.  She has diuresed approximately 2200 cc on IV Lasix, creatinine bumped this morning.  3.  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  Cardiac enzyme trend does not suggest ACS.  4.  Acute renal insufficiency in the setting of diuresis.  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  5.  Carotid artery disease status post right carotid endarterectomy in December 2024.  Will hold IV Lasix for now, follow-up echocardiogram.  May need to consider chest CTA to exclude pulmonary embolus given degree of hypoxia at presentation, seems not entirely consistent with degree of suspected pulmonary edema.  Start IV heparin for now pending improvement in renal function to be able to consider chest CTA.  For questions or updates, please contact [facility] Please consult [email or web address] for contact info under   Signed, Nona Dell, MD  10/21/2023, 10:35 AM

## 2023-10-21 NOTE — Progress Notes (Signed)
 Progress Note   Patient: [name] RUE:[id number] DOB: Nov 03, 1950 DOA: 10/20/2023     1 DOS: the patient was seen and examined on 10/21/2023   Brief hospital admission narrative: As per H&P written by Dr. Sherryll Burger on 10/20/2023 [name] is a 73 y.o. female with medical history significant for RA, IPF, CAD, PVD, carotid endarterectomy, hypertension, dyslipidemia, hypothyroidism, and fibromyalgia who presented to the ED with complaints of headache, weakness, nausea, and dizziness that have been ongoing along with some shortness of breath.  She cannot tell me how long this has been going on.  EMS was called and she was noted to have significant hypoxemia at 54% on room air.  She was placed on 10 L nasal cannula with improvement.  She denies any chest pain, fevers, or chills.  She seems preoccupied with her headache stating that she might have a sinus issue.   ED Course: Vital signs stable and patient afebrile.  She is now requiring 15 L high flow nasal cannula oxygen.  Hemoglobin 10.3 and sodium 133 with troponin of 46 and BNP 1467.  Creatinine 1.28 at baseline.  Chest x-ray showing signs of volume overload and patient has been started on IV Lasix.  EDP spoke with cardiology who plans to further evaluate as well.  Assessment and Plan: 1-acute hypoxemic respiratory failure -Suspected to be secondary to acute on chronic diastolic heart failure -Troponin elevated -Patient initially requiring 15 L of oxygen supplementation; now down to 4 L  -Continue to follow daily weights, strict I's and O's and low-sodium diet. -After aggressive diuresis patient creatinine has increased some and she is negative over 2.2 L; follow recommendations by cardiology service Lasix has been placed on hold. -Will follow 2D echo.  2-elevated troponin and elevated D-dimer -Given degree of hypoxia will await improvement in patient's renal function to check for CTA and rule out PE -Patient has been empirically started on  heparin drip -Continue supportive care -Follow clinical response -Follow 2D echo result -Patient is chest pain-free. -Suspected demand ischemia in the setting of acute hypoxemic process/heart failure.  3-hypothyroidism -Continue Synthroid  4-hypertension -Currently hypotensive and requiring pressors support -Midodrine has been started -Continue holding antihypertensive agent -Follow vital signs.  5-acute kidney injury on chronic kidney disease stage IIIb -Appears to be associated with hypotension and diuresis -Minimize nephrotoxic agents -Follow renal function trend -Maintain adequate oral hydration. -Follow renal function trend  6-dyslipidemia -Continue statin  7-history of rheumatoid arthritis -Continue outpatient follow-up with PCP/rheumatologist -Currently not receiving DMARD  8-GERD -Continue PPI  9-history of PAD/coronary artery disease -Will continue treatment with aspirin, Plavix and statin -Remained chest pain-free -Continue telemetry monitoring and follow 2D echo results.  Subjective:  Chest pain-free, no fever, no nausea, no vomiting.  Reports improvement in her breathing.  Generally weak and frail .  Physical Exam: Vitals:   10/21/23 1507 10/21/23 1515 10/21/23 1530 10/21/23 1610  BP: (!) 119/47 (!) 122/44 (!) 130/40 (!) 118/42  Pulse: 70     Resp: 15 15 12 17   Temp:      TempSrc:      SpO2: 93% 93% 94% 94%  Weight:      Height:       General exam: Alert, awake, oriented x 3; in no acute distress.  Denies chest pain.  No nausea or vomiting.  4-5 L nasal cannula supplementation in place. Respiratory system: Improved air movement bilaterally; decreased breath sounds at the bases.  No using accessory muscle. Cardiovascular system:RRR. No rubs  or gallops; positive murmur appreciated on exam. Gastrointestinal system: Abdomen is nondistended, soft and nontender. No organomegaly or masses felt. Normal bowel sounds heard. Central nervous system: Generally  weak.  No focal neurological deficits. Extremities: No cyanosis or clubbing. Skin: No petechiae. Psychiatry: Judgement and insight appear normal. Mood & affect appropriate.   Data Reviewed: Basic metabolic panel: Sodium 138, potassium 3.4, chloride 97, bicarb 28, BUN 39, creatinine 1.81 and GFR 29 CBC: WBC 7.8, hemoglobin 10.6 and platelet count 192K Magnesium: 1.8  Family Communication: No family at bedside.  Disposition: Status is: Inpatient Remains inpatient appropriate because: Continue IV therapy.  Planned Discharge Destination: Home  CRITICAL CARE Performed by: Vassie Loll   Total critical care time: 60 minutes  Critical care time was exclusive of separately billable procedures and treating other patients.  Critical care was necessary to treat or prevent imminent or life-threatening deterioration.  Critical care was time spent personally by me on the following activities: development of treatment plan with patient and/or surrogate as well as nursing, discussions with consultants, evaluation of patient's response to treatment, examination of patient, obtaining history from patient or surrogate, ordering and performing treatments and interventions, ordering and review of laboratory studies, ordering and review of radiographic studies, pulse oximetry and re-evaluation of patient's condition.   Author: Vassie Loll, MD 10/21/2023 4:38 PM  For on call review [email or web address].

## 2023-10-21 NOTE — TOC Progression Note (Signed)
 Transition of Care [facility]) - Progression Note    Patient Details  Name: [name] MRN: [id number] Date of Birth: 1950/08/27  [facility] CM/SW Contact  Leitha Bleak, RN Phone Number: 10/21/2023, 3:37 PM  Clinical Narrative:    CM called [facility] and spoke with Inocencio Homes to discuss the PT eval. Discharge planning for 2 days. Patient is also on oxygen. They will plan to bring her back in when medically ready. They want updates on oxygen. TOC advised we will be weaning and and update them when she is medically ready.    Expected Discharge Plan: Long Term Nursing Home Barriers to Discharge: Continued Medical Work up  Expected Discharge Plan and Services In-house Referral: Clinical Social Work Discharge Planning Services: CM Consult   Living arrangements for the past 2 months: Assisted Living Facility                   Social Determinants of Health (SDOH) Interventions SDOH Screenings   Food Insecurity: Patient Declined (10/20/2023)  Recent Concern: Food Insecurity - Food Insecurity Present (07/31/2023)   Received from [facility]  Housing: Patient Declined (10/20/2023)  Transportation Needs: Patient Declined (10/20/2023)  Recent Concern: Transportation Needs - Unmet Transportation Needs (07/31/2023)   Received from [facility]  Utilities: Patient Declined (10/20/2023)  Depression (PHQ2-9): Low Risk  (03/11/2020)  Financial Resource Strain: Medium Risk (07/31/2023)   Received from [facility]  Social Connections: Unknown (10/20/2023)  Tobacco Use: Medium Risk (10/20/2023)  Health Literacy: High Risk (07/31/2023)   Received from [facility]    Readmission Risk Interventions    10/20/2023    8:13 PM  Readmission Risk Prevention Plan  Transportation Screening Complete  PCP or Specialist Appt within 3-5 Days Complete  HRI or Home Care Consult Complete  Social Work Consult for Recovery Care Planning/Counseling Complete  Palliative Care Screening Not Applicable   Medication Review Oceanographer) Complete

## 2023-10-21 NOTE — Evaluation (Signed)
 Physical Therapy Evaluation Patient Details Name: [name] MRN: [id number] DOB: 10/19/1950 Today's Date: 10/21/2023  History of Present Illness  [name] is a 73 y.o. female with medical history significant for RA, IPF, CAD, PVD, carotid endarterectomy, hypertension, dyslipidemia, hypothyroidism, and fibromyalgia who presented to the ED with complaints of headache, weakness, nausea, and dizziness that have been ongoing along with some shortness of breath.  She cannot tell me how long this has been going on.  EMS was called and she was noted to have significant hypoxemia at 54% on room air.  She was placed on 10 L nasal cannula with improvement.  She denies any chest pain, fevers, or chills.  She seems preoccupied with her headache stating that she might have a sinus issue.   Clinical Impression  Patient demonstrates decreased LE strength, difficulty walking, and impaired balance. Patient also demonstrates difficulty with bed mobility and functional transfers during today's session with min/mod assist required. Patient also demonstrates increased dizziness each time she stands up or goes from supine to sitting, pt states that it goes away 30 seconds after sitting still. Patient requires min/mod assist for ambulation with RW, decreased stride length and velocity noted. Patient would benefit from skilled physical therapy for increased endurance with ambulation, increased LE strength, and balance for improved gait quality, return to higher level of function with ADLs, and progress towards therapy goals.         If plan is discharge home, recommend the following: A lot of help with walking and/or transfers;A lot of help with bathing/dressing/bathroom;Assistance with cooking/housework;Help with stairs or ramp for entrance;Assist for transportation   Can travel by private vehicle   No    Equipment Recommendations None recommended by PT  Recommendations for Other Services       Functional  Status Assessment Patient has had a recent decline in their functional status and demonstrates the ability to make significant improvements in function in a reasonable and predictable amount of time.     Precautions / Restrictions Precautions Precautions: Fall Recall of Precautions/Restrictions: Intact Restrictions Weight Bearing Restrictions Per Provider Order: No      Mobility  Bed Mobility Overal bed mobility: Needs Assistance Bed Mobility: Supine to Sit     Supine to sit: Min assist, Contact guard     General bed mobility comments: pt requires increased time and CGA/min assist, pt reports dizziness upon sitting up, elevated HOB used as pt has similar bed at facility    Transfers Overall transfer level: Needs assistance Equipment used: Rolling walker (2 wheels) Transfers: Sit to/from Stand, Bed to chair/wheelchair/BSC Sit to Stand: Min assist, Mod assist   Step pivot transfers: Min assist, Mod assist       General transfer comment: Pt requires min to mod assist due to demonstrating dizziness and LE weakness and poor endurance.    Ambulation/Gait Ambulation/Gait assistance: Min assist, Mod assist Gait Distance (Feet): 12 Feet Assistive device: Rolling walker (2 wheels) Gait Pattern/deviations: Decreased step length - right, Decreased step length - left, Decreased stride length, Shuffle Gait velocity: decreased     General Gait Details: Pt requires cueing for managment of RW, pacing/dizziness managment and breathing techniques.  Stairs            Wheelchair Mobility     Tilt Bed    Modified Rankin (Stroke Patients Only)       Balance Overall balance assessment: Needs assistance Sitting-balance support: Single extremity supported Sitting balance-Leahy Scale: Fair  Standing balance support: Bilateral upper extremity supported Standing balance-Leahy Scale: Poor Standing balance comment: multiple bouts of dizziness but pt reports it goes away  after about 30 seconds                             Pertinent Vitals/Pain Pain Assessment Pain Assessment: Faces Faces Pain Scale: Hurts a little bit Pain Location: back soreness Pain Descriptors / Indicators: Aching Pain Intervention(s): Limited activity within patient's tolerance, Repositioned    Home Living Family/patient expects to be discharged to:: Assisted living ([facility] pt reports)                 Home Equipment: None Additional Comments: pt has all needs at ALF    Prior Function Prior Level of Function : Needs assist       Physical Assist : Mobility (physical) Mobility (physical): Gait   Mobility Comments: pt was able to walk in ALF with cane for the most part but had been using her walker for the last couple of days before coming to the hospital. ADLs Comments: ALF     Extremity/Trunk Assessment   Upper Extremity Assessment Upper Extremity Assessment: Overall WFL for tasks assessed    Lower Extremity Assessment Lower Extremity Assessment: Generalized weakness    Cervical / Trunk Assessment Cervical / Trunk Assessment: Kyphotic  Communication   Communication Communication: No apparent difficulties    Cognition Arousal: Alert Behavior During Therapy: WFL for tasks assessed/performed   PT - Cognitive impairments: No apparent impairments                         Following commands: Intact       Cueing Cueing Techniques: Verbal cues, Visual cues     General Comments      Exercises     Assessment/Plan    PT Assessment Patient needs continued PT services  PT Problem List Decreased strength;Decreased activity tolerance;Decreased balance;Decreased mobility;Pain       PT Treatment Interventions DME instruction;Gait training;Stair training;Functional mobility training;Therapeutic activities;Therapeutic exercise;Balance training;Neuromuscular re-education;Patient/family education    PT Goals (Current goals can be  found in the Care Plan section)  Acute Rehab PT Goals Patient Stated Goal: to get stronger before going back to [facility] PT Goal Formulation: With patient Time For Goal Achievement: 11/11/23 Potential to Achieve Goals: Good    Frequency Min 3X/week     Co-evaluation               AM-PAC PT "6 Clicks" Mobility  Outcome Measure Help needed turning from your back to your side while in a flat bed without using bedrails?: A Little Help needed moving from lying on your back to sitting on the side of a flat bed without using bedrails?: A Lot Help needed moving to and from a bed to a chair (including a wheelchair)?: A Lot Help needed standing up from a chair using your arms (e.g., wheelchair or bedside chair)?: A Lot Help needed to walk in hospital room?: A Lot Help needed climbing 3-5 steps with a railing? : A Lot 6 Click Score: 13    End of Session   Activity Tolerance: Patient tolerated treatment well;Patient limited by fatigue Patient left: in chair;with call bell/phone within reach;with chair alarm set Nurse Communication: Mobility status;Precautions PT Visit Diagnosis: Unsteadiness on feet (R26.81);Other abnormalities of gait and mobility (R26.89);Muscle weakness (generalized) (M62.81);History of falling (Z91.81);Difficulty in walking, not elsewhere classified (R26.2)  Time: 2440-1027 PT Time Calculation (min) (ACUTE ONLY): 30 min   Charges:   PT Evaluation $PT Eval Moderate Complexity: 1 Mod PT Treatments $Therapeutic Activity: 8-22 mins           Luz Lex, PT, DPT [facility] Office: [phone] 3:24 PM, 10/21/23

## 2023-10-21 NOTE — Progress Notes (Signed)
 PHARMACY - ANTICOAGULATION CONSULT NOTE  Pharmacy Consult for heparin Indication: pulmonary embolus  Allergies  Allergen Reactions   Celecoxib Itching   Citalopram Other (See Comments) and Palpitations    Heart rate increased   Ciprofloxacin Hcl Other (See Comments)    Mya have made her sick or bad diarrhea   Dicyclomine Diarrhea and Other (See Comments)    Made diarrhea worse   Lamotrigine Diarrhea   Metoclopramide Other (See Comments)    Dizziness   Milnacipran Hcl Other (See Comments)    Unknown   Omeprazole Other (See Comments)   Pregabalin Other (See Comments)   Serotonin Reuptake Inhibitors (Ssris) Other (See Comments)   Simvastatin Other (See Comments)    Increased back and muscle pain   Tape Other (See Comments)    Skin gets red and skin pulls off, paper tape only   Tizanidine Other (See Comments)    Unknown    Trazodone And Nefazodone Other (See Comments)    Hallucinations    Venlafaxine Other (See Comments)    Affected eyes, blurred vision   Codeine Itching   Nefazodone Other (See Comments)   Sertraline Hcl Other (See Comments)    Stomach upset. Pt takes this med at home 11/2022    Patient Measurements: Height: 4\' 11"  (149.9 cm) Weight: 63.9 kg (140 lb 14 oz) IBW/kg (Calculated) : 43.2 HEPARIN DW (KG): 58.3  Vital Signs: BP: 125/44 (04/09 1030)  Labs: Recent Labs    10/20/23 1125 10/20/23 1255 10/21/23 0439  HGB 10.3* 10.6*  --   HCT 33.2* 34.3*  --   PLT 181 192  --   LABPROT 14.8  --   --   INR 1.1  --   --   CREATININE 1.28* 1.25* 1.81*  TROPONINIHS 46* 106*  --     Estimated Creatinine Clearance: 22.2 mL/min (A) (by C-G formula based on SCr of 1.81 mg/dL (H)).   Medical History: Past Medical History:  Diagnosis Date   Abnormal chest CT    Anxiety    Arthritis    Rhumatoid and Osteoarthritis   Carotid artery disease (HCC)    Dr. Myra Gianotti, s/p left CEA, severe R ICA stenosis   Coronary atherosclerosis of native coronary artery     DES RCA/circumflex 4/10, LVEF 65 -> repeat cath 11/2021 with multivessel CAD including LM stenosis, initial med rx   Depression    Essential hypertension    Fibromyalgia    GERD (gastroesophageal reflux disease)    Hiatal hernia    Hyperlipidemia    Kidney cysts    NSTEMI (non-ST elevated myocardial infarction) (HCC)    4/10   Renal artery stenosis (HCC)    Left renal artery stent 12/13   Restless leg syndrome    Sleep apnea    Spondylosis without myelopathy    Tobacco abuse     Medications:  Medications Prior to Admission  Medication Sig Dispense Refill Last Dose/Taking   albuterol (VENTOLIN HFA) 108 (90 Base) MCG/ACT inhaler Inhale 1 puff into the lungs 4 (four) times daily as needed for wheezing or shortness of breath.   10/20/2023 Morning   ALPRAZolam (XANAX) 0.5 MG tablet Take 0.5 mg by mouth 3 (three) times daily as needed for anxiety.   10/19/2023 Bedtime   amLODipine (NORVASC) 10 MG tablet Take 1 tablet (10 mg total) by mouth daily. 30 tablet 2 Unknown   aspirin 81 MG EC tablet Take 81 mg by mouth daily.   Past Month   clopidogrel (PLAVIX)  75 MG tablet Take 1 tablet (75 mg total) by mouth daily. 90 tablet 2 10/19/2023 at  9:30 PM   DULoxetine (CYMBALTA) 30 MG capsule Take 30 mg by mouth daily.   10/20/2023 Morning   EPINEPHrine 0.3 mg/0.3 mL IJ SOAJ injection Inject 0.3 mg into the muscle as needed for anaphylaxis.   Unknown   gentamicin (GARAMYCIN) 0.3 % ophthalmic solution Place 2 drops into both eyes in the morning and at bedtime.   Past Month   levothyroxine (SYNTHROID) 25 MCG tablet Take 25 mcg by mouth daily before breakfast.   10/20/2023 Morning   metoprolol succinate (TOPROL-XL) 100 MG 24 hr tablet Take 1 tablet (100 mg total) by mouth daily. Take with or immediately following a meal. 30 tablet 2 Unknown   nitroGLYCERIN (NITROSTAT) 0.4 MG SL tablet Place 1 tablet (0.4 mg total) under the tongue every 5 (five) minutes x 3 doses as needed for chest pain (if no relief after 2nd dose,  proceed to ED or call 911). 25 tablet 3 Unknown   Omega-3 Fatty Acids (FISH OIL) 1000 MG CAPS Take 1 capsule by mouth daily.   10/20/2023 Morning   ondansetron (ZOFRAN) 4 MG tablet Take 4 mg by mouth 3 (three) times daily as needed for nausea.   10/19/2023 Bedtime   Oyster Shell Calcium 500 MG TABS Take 1 tablet by mouth 2 (two) times daily.   10/20/2023 Morning   pantoprazole (PROTONIX) 40 MG tablet Take 40 mg by mouth daily.   10/20/2023 Morning   traZODone (DESYREL) 100 MG tablet Take 100 mg by mouth at bedtime.   10/19/2023 Bedtime   vitamin B-12 (CYANOCOBALAMIN) 1000 MCG tablet Take 2 tablets (2,000 mcg total) by mouth daily. (Patient taking differently: Take 1,000 mcg by mouth daily.) 60 tablet 0 10/20/2023 Morning   Vitamin D, Ergocalciferol, (DRISDOL) 50000 UNITS CAPS Take 50,000 Units by mouth every Monday.    10/19/2023 Morning   zinc gluconate 50 MG tablet Take 50 mg by mouth daily.   10/20/2023 Morning   isosorbide mononitrate (IMDUR) 60 MG 24 hr tablet Take 1 tablet (60 mg total) by mouth daily. (Patient not taking: Reported on 10/20/2023) 30 tablet 2 Not Taking   rosuvastatin (CRESTOR) 40 MG tablet Take 1 tablet (40 mg total) by mouth daily. (Patient not taking: Reported on 10/20/2023) 15 tablet 0 Not Taking    Assessment: Pharmacy consulted to dose heparin in patient with possible pulmonary embolism.  Starting heparin until renal function improves for chest CTA.  Patient is not on anticoagulation prior to admission but received heparin prophylaxis 4/9 @ 0615.  Goal of Therapy:  Heparin level 0.3-0.7 units/ml Monitor platelets by anticoagulation protocol: Yes   Plan:  Give 3000 units bolus x 1 Start heparin infusion at 950 units/hr Check anti-Xa level in 8 hours and daily while on heparin Continue to monitor H&H and platelets  Judeth Cornfield, PharmD Clinical Pharmacist 10/21/2023 11:02 AM

## 2023-10-21 NOTE — Plan of Care (Signed)
  Problem: Education: Goal: Knowledge of General Education information will improve Description: Including pain rating scale, medication(s)/side effects and non-pharmacologic comfort measures Outcome: Progressing   Problem: Clinical Measurements: Goal: Diagnostic test results will improve Outcome: Progressing   Problem: Coping: Goal: Level of anxiety will decrease Outcome: Progressing   Problem: Pain Managment: Goal: General experience of comfort will improve and/or be controlled Outcome: Progressing   Problem: Safety: Goal: Ability to remain free from injury will improve Outcome: Progressing

## 2023-10-21 NOTE — TOC Progression Note (Signed)
 Transition of Care [facility]) - Progression Note    Patient Details  Name: [name] MRN: [id number] Date of Birth: 09-14-1950  Transition of Care [facility]) CM/SW Contact  Karn Cassis, [place] Phone Number: 10/21/2023, 8:08 AM  Clinical Narrative:  TOC received consult for CHF screening. Pt is ALF resident. CHF education added to AVS.      Expected Discharge Plan: Long Term Nursing Home Barriers to Discharge: Continued Medical Work up  Expected Discharge Plan and Services In-house Referral: Clinical Social Work Discharge Planning Services: CM Consult   Living arrangements for the past 2 months: Assisted Living Facility                                       Social Determinants of Health (SDOH) Interventions SDOH Screenings   Food Insecurity: Patient Declined (10/20/2023)  Recent Concern: Food Insecurity - Food Insecurity Present (07/31/2023)   Received from [facility]  Housing: Patient Declined (10/20/2023)  Transportation Needs: Patient Declined (10/20/2023)  Recent Concern: Transportation Needs - Unmet Transportation Needs (07/31/2023)   Received from [facility]  Utilities: Patient Declined (10/20/2023)  Depression (PHQ2-9): Low Risk  (03/11/2020)  Financial Resource Strain: Medium Risk (07/31/2023)   Received from [facility]  Social Connections: Unknown (10/20/2023)  Tobacco Use: Medium Risk (10/20/2023)  Health Literacy: High Risk (07/31/2023)   Received from [facility]    Readmission Risk Interventions    10/20/2023    8:13 PM  Readmission Risk Prevention Plan  Transportation Screening Complete  PCP or Specialist Appt within 3-5 Days Complete  HRI or Home Care Consult Complete  Social Work Consult for Recovery Care Planning/Counseling Complete  Palliative Care Screening Not Applicable  Medication Review Oceanographer) Complete

## 2023-10-21 NOTE — Progress Notes (Signed)
*  PRELIMINARY RESULTS* Echocardiogram 2D Echocardiogram has been performed.  [name] 10/21/2023, 4:46 PM

## 2023-10-21 NOTE — Plan of Care (Signed)
  Problem: Acute Rehab PT Goals(only PT should resolve) Goal: Pt Will Go Supine/Side To Sit Outcome: Progressing Flowsheets (Taken 10/21/2023 1524) Pt will go Supine/Side to Sit: Independently Goal: Patient Will Transfer Sit To/From Stand Outcome: Progressing Flowsheets (Taken 10/21/2023 1524) Patient will transfer sit to/from stand: Independently Goal: Pt Will Transfer Bed To Chair/Chair To Bed Outcome: Progressing Flowsheets (Taken 10/21/2023 1524) Pt will Transfer Bed to Chair/Chair to Bed: with modified independence Goal: Pt Will Ambulate Outcome: Progressing Flowsheets (Taken 10/21/2023 1524) Pt will Ambulate:  50 feet  with modified independence  with rolling walker  Luz Lex, PT, DPT [facility] Office: [phone] 3:25 PM, 10/21/23

## 2023-10-21 NOTE — Progress Notes (Signed)
 PHARMACY - ANTICOAGULATION CONSULT NOTE  Pharmacy Consult for heparin Indication: pulmonary embolus  Allergies  Allergen Reactions   Celecoxib Itching   Citalopram Other (See Comments) and Palpitations    Heart rate increased   Ciprofloxacin Hcl Other (See Comments)    Mya have made her sick or bad diarrhea   Dicyclomine Diarrhea and Other (See Comments)    Made diarrhea worse   Lamotrigine Diarrhea   Metoclopramide Other (See Comments)    Dizziness   Milnacipran Hcl Other (See Comments)    Unknown   Omeprazole Other (See Comments)   Pregabalin Other (See Comments)   Serotonin Reuptake Inhibitors (Ssris) Other (See Comments)   Simvastatin Other (See Comments)    Increased back and muscle pain   Tape Other (See Comments)    Skin gets red and skin pulls off, paper tape only   Tizanidine Other (See Comments)    Unknown    Trazodone And Nefazodone Other (See Comments)    Hallucinations    Venlafaxine Other (See Comments)    Affected eyes, blurred vision   Codeine Itching   Nefazodone Other (See Comments)   Sertraline Hcl Other (See Comments)    Stomach upset. Pt takes this med at home 11/2022    Patient Measurements: Height: 4\' 11"  (149.9 cm) Weight: 63.9 kg (140 lb 14 oz) IBW/kg (Calculated) : 43.2 HEPARIN DW (KG): 58.3  Vital Signs: Temp: 97.8 F (36.6 C) (04/09 2000) Temp Source: Oral (04/09 2000) BP: 120/61 (04/09 2130) Pulse Rate: 70 (04/09 2000)  Labs: Recent Labs    10/20/23 1125 10/20/23 1255 10/21/23 0439 10/21/23 1955  HGB 10.3* 10.6*  --   --   HCT 33.2* 34.3*  --   --   PLT 181 192  --   --   LABPROT 14.8  --   --   --   INR 1.1  --   --   --   HEPARINUNFRC  --   --   --  0.84*  CREATININE 1.28* 1.25* 1.81*  --   TROPONINIHS 46* 106*  --   --     Estimated Creatinine Clearance: 22.2 mL/min (A) (by C-G formula based on SCr of 1.81 mg/dL (H)).   Medical History: Past Medical History:  Diagnosis Date   Abnormal chest CT    Anxiety     Arthritis    Rhumatoid and Osteoarthritis   Carotid artery disease (HCC)    Dr. Myra Gianotti, s/p left CEA, severe R ICA stenosis   Coronary atherosclerosis of native coronary artery    DES RCA/circumflex 4/10, LVEF 65 -> repeat cath 11/2021 with multivessel CAD including LM stenosis, initial med rx   Depression    Essential hypertension    Fibromyalgia    GERD (gastroesophageal reflux disease)    Hiatal hernia    Hyperlipidemia    Kidney cysts    NSTEMI (non-ST elevated myocardial infarction) (HCC)    4/10   Renal artery stenosis (HCC)    Left renal artery stent 12/13   Restless leg syndrome    Sleep apnea    Spondylosis without myelopathy    Tobacco abuse     Medications:  Medications Prior to Admission  Medication Sig Dispense Refill Last Dose/Taking   albuterol (VENTOLIN HFA) 108 (90 Base) MCG/ACT inhaler Inhale 1 puff into the lungs 4 (four) times daily as needed for wheezing or shortness of breath.   10/20/2023 Morning   ALPRAZolam (XANAX) 0.5 MG tablet Take 0.5 mg by mouth 3 (  three) times daily as needed for anxiety.   10/19/2023 Bedtime   amLODipine (NORVASC) 10 MG tablet Take 1 tablet (10 mg total) by mouth daily. 30 tablet 2 Unknown   aspirin 81 MG EC tablet Take 81 mg by mouth daily.   Past Month   clopidogrel (PLAVIX) 75 MG tablet Take 1 tablet (75 mg total) by mouth daily. 90 tablet 2 10/19/2023 at  9:30 PM   DULoxetine (CYMBALTA) 30 MG capsule Take 30 mg by mouth daily.   10/20/2023 Morning   EPINEPHrine 0.3 mg/0.3 mL IJ SOAJ injection Inject 0.3 mg into the muscle as needed for anaphylaxis.   Unknown   gentamicin (GARAMYCIN) 0.3 % ophthalmic solution Place 2 drops into both eyes in the morning and at bedtime.   Past Month   levothyroxine (SYNTHROID) 25 MCG tablet Take 25 mcg by mouth daily before breakfast.   10/20/2023 Morning   metoprolol succinate (TOPROL-XL) 100 MG 24 hr tablet Take 1 tablet (100 mg total) by mouth daily. Take with or immediately following a meal. 30 tablet 2  Unknown   nitroGLYCERIN (NITROSTAT) 0.4 MG SL tablet Place 1 tablet (0.4 mg total) under the tongue every 5 (five) minutes x 3 doses as needed for chest pain (if no relief after 2nd dose, proceed to ED or call 911). 25 tablet 3 Unknown   Omega-3 Fatty Acids (FISH OIL) 1000 MG CAPS Take 1 capsule by mouth daily.   10/20/2023 Morning   ondansetron (ZOFRAN) 4 MG tablet Take 4 mg by mouth 3 (three) times daily as needed for nausea.   10/19/2023 Bedtime   Oyster Shell Calcium 500 MG TABS Take 1 tablet by mouth 2 (two) times daily.   10/20/2023 Morning   pantoprazole (PROTONIX) 40 MG tablet Take 40 mg by mouth daily.   10/20/2023 Morning   traZODone (DESYREL) 100 MG tablet Take 100 mg by mouth at bedtime.   10/19/2023 Bedtime   vitamin B-12 (CYANOCOBALAMIN) 1000 MCG tablet Take 2 tablets (2,000 mcg total) by mouth daily. (Patient taking differently: Take 1,000 mcg by mouth daily.) 60 tablet 0 10/20/2023 Morning   Vitamin D, Ergocalciferol, (DRISDOL) 50000 UNITS CAPS Take 50,000 Units by mouth every Monday.    10/19/2023 Morning   zinc gluconate 50 MG tablet Take 50 mg by mouth daily.   10/20/2023 Morning   isosorbide mononitrate (IMDUR) 60 MG 24 hr tablet Take 1 tablet (60 mg total) by mouth daily. (Patient not taking: Reported on 10/20/2023) 30 tablet 2 Not Taking   rosuvastatin (CRESTOR) 40 MG tablet Take 1 tablet (40 mg total) by mouth daily. (Patient not taking: Reported on 10/20/2023) 15 tablet 0 Not Taking    Assessment: Pharmacy consulted to dose heparin in patient with possible pulmonary embolism.  Starting heparin until renal function improves for chest CTA.  Patient is not on anticoagulation prior to admission but received heparin prophylaxis 4/9 @ 0615.  Date Time HL Rate/Comment 4/9 1955 0.84 950 u/hr - supratherapeutic  Goal of Therapy:  Heparin level 0.3-0.7 units/ml Monitor platelets by anticoagulation protocol: Yes   Plan:  Level drawn appropriately, no signs/symptoms of bleeding per RN Decrease  heparin infusion to 850 units/hour Check heparin level in 8 hours Monitor daily heparin levels while on heparin infusion Monitor CBC and signs/symptoms of bleeding  Thank you for involving pharmacy in this patient's care.   Rockwell Alexandria, PharmD Clinical Pharmacist 10/21/2023 9:40 PM

## 2023-10-22 ENCOUNTER — Inpatient Hospital Stay (HOSPITAL_COMMUNITY)

## 2023-10-22 DIAGNOSIS — I10 Essential (primary) hypertension: Secondary | ICD-10-CM | POA: Diagnosis not present

## 2023-10-22 DIAGNOSIS — J9601 Acute respiratory failure with hypoxia: Secondary | ICD-10-CM | POA: Diagnosis not present

## 2023-10-22 DIAGNOSIS — I5033 Acute on chronic diastolic (congestive) heart failure: Secondary | ICD-10-CM

## 2023-10-22 DIAGNOSIS — E669 Obesity, unspecified: Secondary | ICD-10-CM | POA: Diagnosis not present

## 2023-10-22 DIAGNOSIS — R570 Cardiogenic shock: Secondary | ICD-10-CM

## 2023-10-22 DIAGNOSIS — E039 Hypothyroidism, unspecified: Secondary | ICD-10-CM | POA: Diagnosis not present

## 2023-10-22 LAB — CBC
HCT: 33.9 % — ABNORMAL LOW (ref 36.0–46.0)
Hemoglobin: 10.6 g/dL — ABNORMAL LOW (ref 12.0–15.0)
MCH: 30.7 pg (ref 26.0–34.0)
MCHC: 31.3 g/dL (ref 30.0–36.0)
MCV: 98.3 fL (ref 80.0–100.0)
Platelets: 163 10*3/uL (ref 150–400)
RBC: 3.45 MIL/uL — ABNORMAL LOW (ref 3.87–5.11)
RDW: 15.9 % — ABNORMAL HIGH (ref 11.5–15.5)
WBC: 5 10*3/uL (ref 4.0–10.5)
nRBC: 0 % (ref 0.0–0.2)

## 2023-10-22 LAB — BASIC METABOLIC PANEL WITH GFR
Anion gap: 9 (ref 5–15)
BUN: 29 mg/dL — ABNORMAL HIGH (ref 8–23)
CO2: 30 mmol/L (ref 22–32)
Calcium: 8.6 mg/dL — ABNORMAL LOW (ref 8.9–10.3)
Chloride: 100 mmol/L (ref 98–111)
Creatinine, Ser: 1.19 mg/dL — ABNORMAL HIGH (ref 0.44–1.00)
GFR, Estimated: 48 mL/min — ABNORMAL LOW (ref >60)
Glucose, Bld: 110 mg/dL — ABNORMAL HIGH (ref 70–99)
Potassium: 3.6 mmol/L (ref 3.5–5.1)
Sodium: 139 mmol/L (ref 135–145)

## 2023-10-22 LAB — HEPARIN LEVEL (UNFRACTIONATED): Heparin Unfractionated: 0.87 [IU]/mL — ABNORMAL HIGH (ref 0.30–0.70)

## 2023-10-22 MED ORDER — HEPARIN SODIUM (PORCINE) 5000 UNIT/ML IJ SOLN
5000.0000 [IU] | Freq: Three times a day (TID) | INTRAMUSCULAR | Status: DC
  Administered 2023-10-22 – 2023-10-26 (×12): 5000 [IU] via SUBCUTANEOUS
  Filled 2023-10-22 (×12): qty 1

## 2023-10-22 MED ORDER — IOHEXOL 350 MG/ML SOLN
75.0000 mL | Freq: Once | INTRAVENOUS | Status: AC | PRN
  Administered 2023-10-22: 75 mL via INTRAVENOUS

## 2023-10-22 MED ORDER — TORSEMIDE 20 MG PO TABS
40.0000 mg | ORAL_TABLET | Freq: Every day | ORAL | Status: DC
  Administered 2023-10-22 – 2023-10-25 (×4): 40 mg via ORAL
  Filled 2023-10-22 (×4): qty 2

## 2023-10-22 NOTE — Progress Notes (Addendum)
   Progress Note  Patient Name: [name] Date of Encounter: 10/22/2023  Primary Cardiologist: Nona Dell, MD  Chest CTA reports no pulmonary emboli.  There is evidence of interstitial edema with moderate right sided and small to moderate left-sided pleural effusions associated with atelectasis at the bases.  Will resume diuretic in the form of Demadex 40 mg daily for now, follow-up urine output and BMET in a.m.  Signed, Nona Dell, MD  10/22/2023, 1:55 PM

## 2023-10-22 NOTE — TOC Progression Note (Signed)
 Transition of Care [facility]) - Progression Note    Patient Details  Name: [name] MRN: [id number] Date of Birth: [birth year]  Transition of Care [facility]) CM/SW Contact  Elliot Gault, LCSW Phone Number: 10/22/2023, 2:02 PM  Clinical Narrative:     TOC following. PT recommending SNF rehab at dc.   Met with pt at bedside to review. Pt agreeable. CMS provider options reviewed. Will refer as requested and start insurance auth.  Will follow.   Expected Discharge Plan: Skilled Nursing Facility Barriers to Discharge: Continued Medical Work up  Expected Discharge Plan and Services In-house Referral: Clinical Social Work Discharge Planning Services: CM Consult Post Acute Care Choice: Skilled Nursing Facility Living arrangements for the past 2 months: Assisted Living Facility                                       Social Determinants of Health (SDOH) Interventions SDOH Screenings   Food Insecurity: Patient Declined (10/20/2023)  Recent Concern: Food Insecurity - Food Insecurity Present (07/31/2023)   Received from [facility]  Housing: Patient Declined (10/20/2023)  Transportation Needs: Patient Declined (10/20/2023)  Recent Concern: Transportation Needs - Unmet Transportation Needs (07/31/2023)   Received from [facility]  Utilities: Patient Declined (10/20/2023)  Depression (PHQ2-9): Low Risk  (03/11/2020)  Financial Resource Strain: Medium Risk (07/31/2023)   Received from [facility]  Social Connections: Unknown (10/20/2023)  Tobacco Use: Medium Risk (10/20/2023)  Health Literacy: High Risk (07/31/2023)   Received from [facility]    Readmission Risk Interventions    10/20/2023    8:13 PM  Readmission Risk Prevention Plan  Transportation Screening Complete  PCP or Specialist Appt within 3-5 Days Complete  HRI or Home Care Consult Complete  Social Work Consult for Recovery Care Planning/Counseling Complete  Palliative Care Screening Not Applicable   Medication Review [employer]) Complete

## 2023-10-22 NOTE — Progress Notes (Signed)
 Transition of Care [facility]) CM/SW Contact  Name: [name]  Phone Number: [phone]  MUST ID: [id number]   To Whom it May Concern:   Please be advised that the above patient will require a short-term nursing home stay, anticipated 30 days or less rehabilitation and strengthening. The plan is for return home.

## 2023-10-22 NOTE — [place] FL2 (Signed)
 [place] MEDICAID FL2 LEVEL OF CARE FORM     IDENTIFICATION  Patient Name: [name] Birthdate: Apr 28, 1951 Sex: female Admission Date (Current Location): 10/20/2023  [place] and [place] Number:  Aaron Edelman [id number] R Facility and Address:  [facility],  [address] [place] [address]      Provider Number: [id number]  Attending Physician Name and Address:  Vassie Loll, MD  Relative Name and Phone Number:       Current Level of Care: Hospital Recommended Level of Care: Skilled Nursing Facility Prior Approval Number:    Date Approved/Denied:   PASRR Number:    Discharge Plan: SNF    Current Diagnoses: Patient Active Problem List   Diagnosis Date Noted   Acute hypoxemic respiratory failure (HCC) 10/20/2023   Status post carotid endarterectomy 07/03/2023   Symptomatic carotid artery stenosis, right 07/03/2023   Anxiety state 06/10/2023   Relational problem 06/10/2023   Acute coronary syndrome with high troponin (HCC) 11/20/2022   Chronic kidney disease stage IIIb 11/18/2022   Obesity (BMI 30-39.9) 11/18/2022   Rheumatoid arthritis (HCC) 11/17/2022   Fibromyalgia 11/17/2022   Hypothyroidism 11/17/2022   Progressive angina with severe left main stenosis 11/16/2022   IPF (idiopathic pulmonary fibrosis) (HCC) 02/25/2022   Abdominal pain, epigastric 11/26/2021   Nausea without vomiting 11/26/2021   Constipation 11/26/2021   Tobacco abuse 11/16/2021   Abnormal chest CT 11/16/2021   Abnormal nuclear cardiac imaging test    Mixed incontinence 07/16/2021   History of UTI 07/16/2021   AMS (altered mental status) 03/11/2020   AKI (acute kidney injury) (HCC) 03/11/2020   Hypokalemia 03/11/2020   Altered mental status 03/11/2020   Depression    Prolonged QT interval    Unilateral primary osteoarthritis, right knee 02/22/2020   Gastroesophageal reflux disease without esophagitis 01/25/2018   Encounter for colonoscopy due to history of adenomatous  colonic polyps 03/23/2017   Melena 01/31/2014   Aftercare following surgery of the circulatory system, NEC 01/30/2014   Tooth pain 05/03/2013   GERD (gastroesophageal reflux disease) 02/16/2013   Bloating 02/16/2013   Renal artery stenosis (HCC) 11/04/2012   PAD (peripheral artery disease) (HCC) 05/07/2012   Shortness of breath 04/14/2012   Carotid artery stenosis 11/13/2009   Hyperlipidemia 04/19/2009   Essential hypertension 03/12/2009   CAD (coronary artery disease) 03/12/2009    Orientation RESPIRATION BLADDER Height & Weight     Time, Situation, Place, Self  O2 (see dc summary) Continent Weight: 139 lb 12.4 oz (63.4 kg) Height:  5' (152.4 cm)  BEHAVIORAL SYMPTOMS/MOOD NEUROLOGICAL BOWEL NUTRITION STATUS      Continent Diet (see dc summary)  AMBULATORY STATUS COMMUNICATION OF NEEDS Skin   Extensive Assist Verbally Normal                       Personal Care Assistance Level of Assistance  Bathing, Feeding, Dressing Bathing Assistance: Limited assistance Feeding assistance: Limited assistance Dressing Assistance: Limited assistance     Functional Limitations Info  Hearing, Sight, Speech Sight Info: Impaired Hearing Info: Adequate Speech Info: Adequate    SPECIAL CARE FACTORS FREQUENCY  PT (By licensed PT), OT (By licensed OT)     PT Frequency: 5x week OT Frequency: 5x week            Contractures Contractures Info: Not present    Additional Factors Info  Code Status, Allergies, Psychotropic Code Status Info: Full code Allergies Info: Celecoxib  Citalopram  Ciprofloxacin Hcl  Dicyclomine  Lamotrigine  Metoclopramide  Milnacipran Hcl  Omeprazole  Pregabalin  Serotonin Reuptake Inhibitors (Ssris)  Simvastatin  Tape  Tizanidine  Trazodone And Nefazodone  Venlafaxine  Codeine  Nefazodone  Sertraline Hcl Psychotropic Info: Xanax, Cymbalta, Trazodone, Zoloft         Current Medications (10/22/2023):  This is the current hospital active medication  list Current Facility-Administered Medications  Medication Dose Route Frequency Provider Last Rate Last Admin   acetaminophen (TYLENOL) tablet 650 mg  650 mg Oral Q4H PRN Maurilio Lovely D, DO   650 mg at 10/22/23 0445   albuterol (PROVENTIL) (2.5 MG/3ML) 0.083% nebulizer solution 2.5 mg  2.5 mg Nebulization Q2H PRN Sherryll Burger, Pratik D, DO       ALPRAZolam Prudy Feeler) tablet 0.5 mg  0.5 mg Oral TID PRN Sherryll Burger, Pratik D, DO       aspirin EC tablet 81 mg  81 mg Oral Daily Sherryll Burger, Pratik D, DO   81 mg at 10/22/23 2956   Chlorhexidine Gluconate Cloth 2 % PADS 6 each  6 each Topical Daily Maurilio Lovely D, DO   6 each at 10/22/23 1120   clopidogrel (PLAVIX) tablet 75 mg  75 mg Oral Daily Maurilio Lovely D, DO   75 mg at 10/22/23 0815   DULoxetine (CYMBALTA) DR capsule 20 mg  20 mg Oral QHS Shah, Pratik D, DO   20 mg at 10/21/23 2129   heparin injection 5,000 Units  5,000 Units Subcutaneous Q8H Vassie Loll, MD       levothyroxine (SYNTHROID) tablet 25 mcg  25 mcg Oral QAC breakfast Sherryll Burger, Pratik D, DO   25 mcg at 10/22/23 0514   midodrine (PROAMATINE) tablet 7.5 mg  7.5 mg Oral TID WC Vassie Loll, MD   7.5 mg at 10/22/23 1148   mupirocin ointment (BACTROBAN) 2 % 1 Application  1 Application Nasal BID Maurilio Lovely D, DO   1 Application at 10/22/23 2130   nitroGLYCERIN (NITROSTAT) SL tablet 0.4 mg  0.4 mg Sublingual Q5 Min x 3 PRN Sherryll Burger, Pratik D, DO       norepinephrine (LEVOPHED) 4mg  in (0.016 mg/mL) premix infusion  0-10 mcg/min Intravenous Titrated Vassie Loll, MD   Stopped at 10/22/23 0440   oxyCODONE-acetaminophen (PERCOCET/ROXICET) 5-325 MG per tablet 1 tablet  1 tablet Oral Q6H PRN Maurilio Lovely D, DO   1 tablet at 10/21/23 2334   pantoprazole (PROTONIX) EC tablet 40 mg  40 mg Oral Daily Sherryll Burger, Pratik D, DO   40 mg at 10/22/23 0815   rosuvastatin (CRESTOR) tablet 40 mg  40 mg Oral Daily Sherryll Burger, Pratik D, DO   40 mg at 10/22/23 8657   sertraline (ZOLOFT) tablet 50 mg  50 mg Oral Daily Sherryll Burger, Pratik D, DO   50  mg at 10/22/23 0815   sodium chloride flush (NS) 0.9 % injection 3 mL  3 mL Intravenous Q12H Shah, Pratik D, DO   3 mL at 10/22/23 1120   sodium chloride flush (NS) 0.9 % injection 3 mL  3 mL Intravenous PRN Sherryll Burger, Pratik D, DO       torsemide (DEMADEX) tablet 40 mg  40 mg Oral Daily Jonelle Sidle, MD       traZODone (DESYREL) tablet 50 mg  50 mg Oral QHS Shah, Pratik D, DO   50 mg at 10/21/23 2129     Discharge Medications: Please see discharge summary for a list of discharge medications.  Relevant Imaging Results:  Relevant Lab Results:   Additional Information SSN: [id number]  [address], LCSW

## 2023-10-22 NOTE — Progress Notes (Signed)
 Progress Note   Patient: [name]:[id number] DOB: Mar 29, 1951 DOA: 10/20/2023     2 DOS: the patient was seen and examined on 10/22/2023   Brief hospital admission narrative: As per H&P written by Dr. Sherryll Burger on 10/20/2023 [name] is a 73 y.o. female with medical history significant for RA, IPF, CAD, PVD, carotid endarterectomy, hypertension, dyslipidemia, hypothyroidism, and fibromyalgia who presented to the ED with complaints of headache, weakness, nausea, and dizziness that have been ongoing along with some shortness of breath.  She cannot tell me how long this has been going on.  EMS was called and she was noted to have significant hypoxemia at 54% on room air.  She was placed on 10 L nasal cannula with improvement.  She denies any chest pain, fevers, or chills.  She seems preoccupied with her headache stating that she might have a sinus issue.   ED Course: Vital signs stable and patient afebrile.  She is now requiring 15 L high flow nasal cannula oxygen.  Hemoglobin 10.3 and sodium 133 with troponin of 46 and BNP 1467.  Creatinine 1.28 at baseline.  Chest x-ray showing signs of volume overload and patient has been started on IV Lasix.  EDP spoke with cardiology who plans to further evaluate as well.  Assessment and Plan: 1-acute hypoxemic respiratory failure -Suspected to be secondary to acute on chronic diastolic heart failure -Troponin elevated -Patient initially requiring 15 L of oxygen supplementation; now down to 3 L  -Continue to follow daily weights, strict I's and O's and low-sodium diet. -After aggressive diuresis patient creatinine has increased some and she is negative over 3 L since admission; follow recommendations by cardiology service regarding further diuretic therapy. -Still short winded with activity but overall feeling better and requiring less oxygen supplementation.  2-elevated troponin and elevated D-dimer -Now the patient's renal function has improved we will  check CTA of the chest; if negative stop heparin drip. -Continue supportive care -Follow clinical response -2D echo with preserved ejection fraction and no wall motion abnormalities currently appreciated. -Patient is chest pain-free. -Suspected demand ischemia in the setting of acute hypoxemic process/heart failure; values and EKG/telemetry not supporting ACS. -Will follow cardiology service recommendations regarding resumption of diuretics.  3-hypothyroidism -Continue Synthroid -TSH within normal limits.  4-hypertension/cardiogenic shock -Currently hypotensive and requiring pressors support -Continue  the use of midodrine. -Continue holding antihypertensive agent at the moment. -Continue to follow vital signs.  5-acute kidney injury on chronic kidney disease stage IIIb -Appears to be associated with hypotension and diuresis -Continue to minimize nephrotoxic agents -Renal patient has adequately improved and creatinine down to 1.19 -Continue to maintain adequate oral hydration. -Follow renal function trend; especially after pursuing CT chest angiogram evaluation.  6-dyslipidemia -Continue statin -Heart healthy diet discussed with patient.  7-history of rheumatoid arthritis -Continue outpatient follow-up with PCP/rheumatologist -Currently not receiving DMARD  8-GERD -Continue PPI  9-history of PAD/coronary artery disease -Will continue treatment with aspirin, Plavix and statin -Remained chest pain-free -No ischemic changes appreciated on telemetry -2D echo with preserved ejection fraction and no wall motion abnormalities.  Subjective:  Generally weak and frail; no chest pain, no nausea vomiting.  Patient reports some headaches expressed feeling short winded with activity.  Oxygen supplementation 3 L through nasal cannula. .  Physical Exam: Vitals:   10/22/23 1330 10/22/23 1400 10/22/23 1500 10/22/23 1600  BP: (!) 109/51 (!) 103/52 (!) 121/52 (!) 135/59  Pulse:       Resp: 13 16 15  13  Temp:      TempSrc:      SpO2: 94% 92% 93% 94%  Weight:      Height:       General exam: Alert, awake, oriented x 3; no chest pain, no nausea, no vomiting.  Expressed having some headache and is still feeling short winded with activity. Respiratory system: Overall improved air movement, no significant crackles appreciated on exam, positive scattered rhonchi.  No using accessory muscle.  3 L nasal cannula in place. Cardiovascular system: Rate controlled, no rubs, no gallops, no JVD.  Positive systolic murmur appreciated on exam. Gastrointestinal system: Abdomen is nondistended, soft and nontender. No organomegaly or masses felt. Normal bowel sounds heard. Central nervous system: No focal neurological deficits. Extremities: No cyanosis or clubbing. Skin: No petechiae. Psychiatry: Judgement and insight appear normal. Mood & affect appropriate.   Data Reviewed: Basic metabolic panel: Sodium 139, potassium 3.6, chloride 100, bicarb 30, BUN 29, creatinine 1.19 and GFR 48 CBC: WBCs 5.0, hemoglobin 10.6 and platelets count 163K Magnesium: 1.8  Family Communication: No family at bedside on today's evaluation..  Disposition: Status is: Inpatient Remains inpatient appropriate because: Continue IV therapy.  Planned Discharge Destination: Skilled nursing facility   CRITICAL CARE Performed by: Vassie Loll   Total critical care time: 55 minutes  Critical care time was exclusive of separately billable procedures and treating other patients.  Critical care was necessary to treat or prevent imminent or life-threatening deterioration.  Critical care was time spent personally by me on the following activities: development of treatment plan with patient and/or surrogate as well as nursing, discussions with consultants, evaluation of patient's response to treatment, examination of patient, obtaining history from patient or surrogate, ordering and performing treatments and  interventions, ordering and review of laboratory studies, ordering and review of radiographic studies, pulse oximetry and re-evaluation of patient's condition.   Author: Vassie Loll, MD 10/22/2023 6:29 PM  For on call review [email or web address].

## 2023-10-22 NOTE — Progress Notes (Signed)
 PHARMACY - ANTICOAGULATION CONSULT NOTE  Pharmacy Consult for heparin Indication: pulmonary embolus  Patient Measurements: Height: 4\' 11"  (149.9 cm) Weight: 62.9 kg (138 lb 10.7 oz) IBW/kg (Calculated) : 43.2 HEPARIN DW (KG): 58.3  Vital Signs: Temp: 97.6 F (36.4 C) (04/10 0407) Temp Source: Axillary (04/10 0015) BP: 108/48 (04/10 0615) Pulse Rate: 79 (04/10 0400)  Labs: Recent Labs    10/20/23 1125 10/20/23 1255 10/21/23 0439 10/21/23 1955 10/22/23 0430 10/22/23 0541  HGB 10.3* 10.6*  --   --  10.6*  --   HCT 33.2* 34.3*  --   --  33.9*  --   PLT 181 192  --   --  163  --   LABPROT 14.8  --   --   --   --   --   INR 1.1  --   --   --   --   --   HEPARINUNFRC  --   --   --  0.84*  --  0.87*  CREATININE 1.28* 1.25* 1.81*  --  1.19*  --   TROPONINIHS 46* 106*  --   --   --   --     Estimated Creatinine Clearance: 33.5 mL/min (A) (by C-G formula based on SCr of 1.19 mg/dL (H)).  Assessment: Pharmacy consulted to dose heparin in patient with possible pulmonary embolism. Starting heparin until renal function improves for chest CTA.  Patient is not on anticoagulation prior to admission but received heparin prophylaxis 4/9 @ 0615.  Date Time HL Rate/Comment 4/9 1955 0.84 950 u/hr - supratherapeutic 4/10 0541 0.87 850 u/hr - supratherapeutic   Goal of Therapy:  Heparin level 0.3-0.7 units/ml Monitor platelets by anticoagulation protocol: Yes   Plan:  Level drawn appropriately, no signs/symptoms of bleeding per RN Decrease heparin infusion to 700 units/hour Check heparin level in 8 hours Monitor daily heparin levels while on heparin infusion Monitor CBC and signs/symptoms of bleeding  Thank you for involving pharmacy in this patient's care.   Arabella Merles, PharmD. Clinical Pharmacist 10/22/2023 6:31 AM

## 2023-10-22 NOTE — Plan of Care (Signed)

## 2023-10-22 NOTE — Progress Notes (Signed)
 Progress Note  Patient Name: [name] Date of Encounter: 10/22/2023  Primary Cardiologist: Nona Dell, MD  Interval Summary   Feels somewhat better today.  Still has intermittent cough, mild headache.  No chest pain or palpitations.  Limited appetite.  Vital Signs    Vitals:   10/22/23 0700 10/22/23 0748 10/22/23 0800 10/22/23 0843  BP: (!) 102/59  (!) 109/49   Pulse:      Resp: 12  11   Temp:  98.4 F (36.9 C)    TempSrc:  Oral    SpO2: 97%  91% 94%  Weight:      Height:        Intake/Output Summary (Last 24 hours) at 10/22/2023 0855 Last data filed at 10/22/2023 0816 Gross per 24 hour  Intake 1077.06 ml  Output 1950 ml  Net -872.94 ml   Filed Weights   10/20/23 1042 10/21/23 0500 10/22/23 0407  Weight: 68.3 kg 63.9 kg 62.9 kg    Physical Exam   GEN: No acute distress.   Neck: No JVD. Cardiac: RRR, 2.6 systolic murmur, rub, or gallop.  Respiratory: Nonlabored.  Mild expiratory wheeze. GI: Soft, nontender, bowel sounds present. MS: No edema.  ECG/Telemetry    Telemetry reviewed showing sinus rhythm.  Labs    Chemistry Recent Labs  Lab 10/20/23 1125 10/20/23 1255 10/21/23 0439 10/22/23 0430  NA 133*  --  138 139  K 4.7  --  3.4* 3.6  CL 97*  --  97* 100  CO2 24  --  28 30  GLUCOSE 115*  --  108* 110*  BUN 29*  --  39* 29*  CREATININE 1.28* 1.25* 1.81* 1.19*  CALCIUM 9.2  --  9.1 8.6*  PROT 7.1  --   --   --   ALBUMIN 3.4*  --   --   --   AST 23  --   --   --   ALT 16  --   --   --   ALKPHOS 70  --   --   --   BILITOT 1.1  --   --   --   GFRNONAA 44* 45* 29* 48*  ANIONGAP 12  --  13 9    Hematology Recent Labs  Lab 10/20/23 1125 10/20/23 1250 10/20/23 1255 10/22/23 0430  WBC 8.1  --  7.8 5.0  RBC 3.38* 3.47* 3.55* 3.45*  HGB 10.3*  --  10.6* 10.6*  HCT 33.2*  --  34.3* 33.9*  MCV 98.2  --  96.6 98.3  MCH 30.5  --  29.9 30.7  MCHC 31.0  --  30.9 31.3  RDW 15.8*  --  15.5 15.9*  PLT 181  --  192 163   Cardiac  Enzymes Recent Labs  Lab 10/20/23 1125 10/20/23 1255  TROPONINIHS 46* 106*   Lipid Panel     Component Value Date/Time   CHOL 106 07/04/2023 0330   TRIG 67 07/04/2023 0330   HDL 46 07/04/2023 0330   CHOLHDL 2.3 07/04/2023 0330   VLDL 13 07/04/2023 0330   LDLCALC 47 07/04/2023 0330    Cardiac Studies   Echocardiogram 10/21/2023:  1. Left ventricular ejection fraction, by estimation, is 65 to 70%. The  left ventricle has normal function. The left ventricle has no regional  wall motion abnormalities. There is mild concentric left ventricular  hypertrophy. Left ventricular diastolic  parameters are indeterminate.   2. Right ventricular systolic function is normal. The right ventricular  size  is normal. There is normal pulmonary artery systolic pressure. The  estimated right ventricular systolic pressure is 34.8 mmHg.   3. Left atrial size was mildly dilated.   4. The mitral valve is grossly normal. Trivial mitral valve  regurgitation.   5. The aortic valve is tricuspid. There is mild calcification of the  aortic valve. Aortic valve regurgitation is not visualized. Aortic valve  sclerosis/calcification is present, without any evidence of aortic  stenosis.   6. The inferior vena cava is normal in size with greater than 50%  respiratory variability, suggesting right atrial pressure of 3 mmHg.   Assessment & Plan   1.  Presentation with shortness of breath and acute hypoxic respiratory failure, viral respiratory panel negative despite recent cough.  HFpEF suspected to be at least component.  D-dimer 1.23, BNP 1467.  High-sensitivity troponin I levels do not suggest ACS.  LV and RV function normal by echocardiogram.  2.  Possible HFpEF exacerbation, LVEF 65 to 70% by follow-up echocardiogram.  Diuretics held at this point with bump in creatinine.  Net diuresis of approximately 3000 cc.  3.  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  Cardiac enzyme trend does not suggest ACS.  4.  Acute renal insufficiency in the setting of diuresis.  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  Follow-up creatinine today down to 1.19 with GFR 48.  5.  Carotid artery disease status post right carotid endarterectomy in December 2024.  Plan to proceed with chest CTA to exclude pulmonary embolus, she is on IV heparin for now.  Otherwise on aspirin 81 mg daily, Plavix 75 mg daily, ProAmatine 7.5 mg 3 times daily, and Crestor 40 mg daily.  For questions or updates, please contact [facility] Please consult [email or web address] for contact info under   Signed, Nona Dell, MD  10/22/2023, 8:55 AM

## 2023-10-23 DIAGNOSIS — I10 Essential (primary) hypertension: Secondary | ICD-10-CM | POA: Diagnosis not present

## 2023-10-23 DIAGNOSIS — I5032 Chronic diastolic (congestive) heart failure: Secondary | ICD-10-CM

## 2023-10-23 DIAGNOSIS — I5033 Acute on chronic diastolic (congestive) heart failure: Secondary | ICD-10-CM

## 2023-10-23 DIAGNOSIS — E782 Mixed hyperlipidemia: Secondary | ICD-10-CM | POA: Diagnosis not present

## 2023-10-23 DIAGNOSIS — I25110 Atherosclerotic heart disease of native coronary artery with unstable angina pectoris: Secondary | ICD-10-CM | POA: Diagnosis not present

## 2023-10-23 LAB — BASIC METABOLIC PANEL WITH GFR
Anion gap: 8 (ref 5–15)
BUN: 26 mg/dL — ABNORMAL HIGH (ref 8–23)
CO2: 33 mmol/L — ABNORMAL HIGH (ref 22–32)
Calcium: 8.8 mg/dL — ABNORMAL LOW (ref 8.9–10.3)
Chloride: 96 mmol/L — ABNORMAL LOW (ref 98–111)
Creatinine, Ser: 1.46 mg/dL — ABNORMAL HIGH (ref 0.44–1.00)
GFR, Estimated: 38 mL/min — ABNORMAL LOW (ref >60)
Glucose, Bld: 97 mg/dL (ref 70–99)
Potassium: 3.6 mmol/L (ref 3.5–5.1)
Sodium: 137 mmol/L (ref 135–145)

## 2023-10-23 LAB — CBC
HCT: 35.1 % — ABNORMAL LOW (ref 36.0–46.0)
Hemoglobin: 10.9 g/dL — ABNORMAL LOW (ref 12.0–15.0)
MCH: 30.4 pg (ref 26.0–34.0)
MCHC: 31.1 g/dL (ref 30.0–36.0)
MCV: 98 fL (ref 80.0–100.0)
Platelets: 168 10*3/uL (ref 150–400)
RBC: 3.58 MIL/uL — ABNORMAL LOW (ref 3.87–5.11)
RDW: 15.9 % — ABNORMAL HIGH (ref 11.5–15.5)
WBC: 5 10*3/uL (ref 4.0–10.5)
nRBC: 0 % (ref 0.0–0.2)

## 2023-10-23 MED ORDER — POTASSIUM CHLORIDE CRYS ER 20 MEQ PO TBCR
40.0000 meq | EXTENDED_RELEASE_TABLET | Freq: Once | ORAL | Status: AC
  Administered 2023-10-23: 40 meq via ORAL
  Filled 2023-10-23: qty 2

## 2023-10-23 MED ORDER — MIDODRINE HCL 5 MG PO TABS
5.0000 mg | ORAL_TABLET | Freq: Three times a day (TID) | ORAL | Status: DC
  Administered 2023-10-23 – 2023-10-24 (×4): 5 mg via ORAL
  Filled 2023-10-23 (×4): qty 1

## 2023-10-23 MED ORDER — ORAL CARE MOUTH RINSE: 15.0000 mL | OROMUCOSAL | Status: DC | PRN

## 2023-10-23 MED ORDER — DAPAGLIFLOZIN PROPANEDIOL 10 MG PO TABS
10.0000 mg | ORAL_TABLET | Freq: Every day | ORAL | Status: DC
  Administered 2023-10-23 – 2023-10-26 (×4): 10 mg via ORAL
  Filled 2023-10-23 (×4): qty 1

## 2023-10-23 MED ORDER — MAGNESIUM SULFATE 2 GM/50ML IV SOLN
2.0000 g | Freq: Once | INTRAVENOUS | Status: AC
  Administered 2023-10-23: 2 g via INTRAVENOUS
  Filled 2023-10-23: qty 50

## 2023-10-23 NOTE — Assessment & Plan Note (Signed)
Continue statin and antiplatelet therapy.

## 2023-10-23 NOTE — Assessment & Plan Note (Signed)
 No acute coronary syndrome.  Continue with aspirin, clopidogrel and statin

## 2023-10-23 NOTE — Assessment & Plan Note (Addendum)
 Echocardiogram with preserved LV systolic function with EF 65 to 70%, mild LVH, RV systolic function preserved, RVSP 34.8 mmHg, no significant valvular disease.   Urine output is 1,600 ml Systolic blood pressure 119 mmHg.   Plan to continue diuresis with torsemide 40 mg po daily.  Continue SGLT 2 inh, and blood pressure support with midodrine, decrease to bid dosing midodrine

## 2023-10-23 NOTE — Plan of Care (Signed)

## 2023-10-23 NOTE — Hospital Course (Addendum)
 [name] was admitted to the hospital with the working diagnosis of acute heart failure decompensation.   73 y.o. female with medical history significant for RA, IPF, CAD, PVD, carotid endarterectomy, hypertension, dyslipidemia, hypothyroidism, and fibromyalgia who presented to the ED with complaints of headache, weakness, nausea, and dizziness that have been ongoing along with some shortness of breath.   EMS was called and she was noted to have significant hypoxemia at 54% on room air.  She was placed on 10 L nasal cannula with improvement.  On her initial physical examination her blood pressure was 116/59, HR 98, RR 13 and 02 saturation 61%.  Lungs with wheezing or rhonchi, heart with S1 and S2 present and regular with no gallops, rubs or murmurs, abdomen with no distention and no lower extremity edema.   VBG 7,32/ 54/ 39/ 27/ 56% . Na 133, K 4,7 Cl 97 bicarbonate 24 glucose 115 bun 29 cr 1.28  BNP 1,467 High sensitive troponin 46 and 106  Lactic acid 1,8 and 1.1  Wbc 8,1 hgb 10.3 plt 181   Sars covid 19 negative Influenza negative RSV negative   Chest radiograph with cardiomegaly, bilateral hilar vascular congestion, bilateral interstitial infiltrates, more predominant at the lower lobes, small right pleural effusion, fluid in the right fissure.   CT chest with no pulmonary emboli Mild increase diffuse interstitial marking and ground glass opacities.  Moderate sized right and small left pleural effusion. Bilateral lower lobes atelectasis.  Borderline enlarged central pulmonary arteries.   EKG 69 bpm, normal axis, normal intervals, qtc 466, sinus rhythm with no significant ST segment or T wave changes.   Patient was placed on aggressive diuresis with IV furosemide with improvement in her symptoms.  Negative fluid balance was achieved.  Echocardiogram with preserved LV systolic function.  04/12 patient medically stable for transfer to SNF. 04/14 patient has been approved for  transfer to SNF, continue oral loop diuretic as outpatient and follow up with primary care and cardiology.

## 2023-10-23 NOTE — Assessment & Plan Note (Signed)
 Continue with statin therapy.  ?

## 2023-10-23 NOTE — TOC Progression Note (Signed)
 Transition of Care [facility]) - Progression Note    Patient Details  Name: [name] MRN: [id number] Date of Birth: 12/14/1950  Transition of Care [facility]) CM/SW Contact  Elliot Gault, LCSW Phone Number: 10/23/2023, 1:43 PM  Clinical Narrative:     TOC following. Reviewed SNF bed offers with pt who selected [facility]. Offered to update pt's family though she declined. Updated auth request with selected facility. Approval pending.  DC timeframe not yet known. TOC will follow.  Expected Discharge Plan: Skilled Nursing Facility Barriers to Discharge: Continued Medical Work up  Expected Discharge Plan and Services In-house Referral: Clinical Social Work Discharge Planning Services: CM Consult Post Acute Care Choice: Skilled Nursing Facility Living arrangements for the past 2 months: Assisted Living Facility                                       Social Determinants of Health (SDOH) Interventions SDOH Screenings   Food Insecurity: Patient Declined (10/20/2023)  Recent Concern: Food Insecurity - Food Insecurity Present (07/31/2023)   Received from [facility]  Housing: Patient Declined (10/20/2023)  Transportation Needs: Patient Declined (10/20/2023)  Recent Concern: Transportation Needs - Unmet Transportation Needs (07/31/2023)   Received from [facility]  Utilities: Patient Declined (10/20/2023)  Depression (PHQ2-9): Low Risk  (03/11/2020)  Financial Resource Strain: Medium Risk (07/31/2023)   Received from [facility]  Social Connections: Unknown (10/20/2023)  Tobacco Use: Medium Risk (10/20/2023)  Health Literacy: High Risk (07/31/2023)   Received from [facility]    Readmission Risk Interventions    10/20/2023    8:13 PM  Readmission Risk Prevention Plan  Transportation Screening Complete  PCP or Specialist Appt within 3-5 Days Complete  HRI or Home Care Consult Complete  Social Work Consult for Recovery Care Planning/Counseling Complete  Palliative Care  Screening Not Applicable  Medication Review Oceanographer) Complete

## 2023-10-23 NOTE — Assessment & Plan Note (Addendum)
 Continue with duloxetine. (Patient not on sertraline anymore at home) Continue as needed alprazolam for anxiety.

## 2023-10-23 NOTE — Progress Notes (Signed)
 Heart Failure Navigator Progress Note  Assessed for Heart & Vascular TOC clinic readiness.  Per TOC CSW note patient to be discharged to a Skilled Nursing Facility.  Navigator available for reassessment of patient but will sign off at this time.  Roxy Horseman, RN, BSN [facility] Heart Failure Navigator Secure Chat Only

## 2023-10-23 NOTE — Assessment & Plan Note (Signed)
 Continue with levothyroxine

## 2023-10-23 NOTE — Progress Notes (Signed)
 Progress Note  Patient Name: [name] Date of Encounter: 10/23/2023  Primary Cardiologist: Nona Dell, MD  Interval Summary   Chart reviewed.  States that she feels better today.  Chest CTA negative for pulmonary embolus but did show interstitial edema and pleural effusions.  She was started back on diuretics in the form of oral Demadex yesterday.  Vital Signs    Vitals:   10/23/23 0500 10/23/23 0539 10/23/23 0600 10/23/23 0815  BP: (!) 115/44  (!) 105/38   Pulse:      Resp: 13  19   Temp:    98.8 F (37.1 C)  TempSrc:    Oral  SpO2: 96% 96% 92%   Weight: 62.2 kg     Height:        Intake/Output Summary (Last 24 hours) at 10/23/2023 0841 Last data filed at 10/23/2023 0624 Gross per 24 hour  Intake 1239.82 ml  Output 2850 ml  Net -1610.18 ml   Filed Weights   10/22/23 0407 10/22/23 0800 10/23/23 0500  Weight: 62.9 kg 63.4 kg 62.2 kg    Physical Exam   GEN: No acute distress.   Neck: No JVD. Cardiac: RRR, 2/6 systolic murmur, rub, or gallop.  Respiratory: Nonlabored.  No wheezing, decreased breath sounds at the bases. GI: Soft, nontender, bowel sounds present. MS: No edema.  ECG/Telemetry    Telemetry reviewed showing sinus rhythm.  Labs    Chemistry Recent Labs  Lab 10/20/23 1125 10/20/23 1255 10/21/23 0439 10/22/23 0430 10/23/23 0439  NA 133*  --  138 139 137  K 4.7  --  3.4* 3.6 3.6  CL 97*  --  97* 100 96*  CO2 24  --  28 30 33*  GLUCOSE 115*  --  108* 110* 97  BUN 29*  --  39* 29* 26*  CREATININE 1.28*   < > 1.81* 1.19* 1.46*  CALCIUM 9.2  --  9.1 8.6* 8.8*  PROT 7.1  --   --   --   --   ALBUMIN 3.4*  --   --   --   --   AST 23  --   --   --   --   ALT 16  --   --   --   --   ALKPHOS 70  --   --   --   --   BILITOT 1.1  --   --   --   --   GFRNONAA 44*   < > 29* 48* 38*  ANIONGAP 12  --  13 9 8    < > = values in this interval not displayed.    Hematology Recent Labs  Lab 10/20/23 1255 10/22/23 0430 10/23/23 0439  WBC  7.8 5.0 5.0  RBC 3.55* 3.45* 3.58*  HGB 10.6* 10.6* 10.9*  HCT 34.3* 33.9* 35.1*  MCV 96.6 98.3 98.0  MCH 29.9 30.7 30.4  MCHC 30.9 31.3 31.1  RDW 15.5 15.9* 15.9*  PLT 192 163 168   Cardiac Enzymes Recent Labs  Lab 10/20/23 1125 10/20/23 1255  TROPONINIHS 46* 106*   Lipid Panel     Component Value Date/Time   CHOL 106 07/04/2023 0330   TRIG 67 07/04/2023 0330   HDL 46 07/04/2023 0330   CHOLHDL 2.3 07/04/2023 0330   VLDL 13 07/04/2023 0330   LDLCALC 47 07/04/2023 0330    Cardiac Studies   Echocardiogram 10/21/2023:  1. Left ventricular ejection fraction, by estimation, is 65 to 70%. The  left ventricle has normal function. The left ventricle has no regional  wall motion abnormalities. There is mild concentric left ventricular  hypertrophy. Left ventricular diastolic  parameters are indeterminate.   2. Right ventricular systolic function is normal. The right ventricular  size is normal. There is normal pulmonary artery systolic pressure. The  estimated right ventricular systolic pressure is 34.8 mmHg.   3. Left atrial size was mildly dilated.   4. The mitral valve is grossly normal. Trivial mitral valve  regurgitation.   5. The aortic valve is tricuspid. There is mild calcification of the  aortic valve. Aortic valve regurgitation is not visualized. Aortic valve  sclerosis/calcification is present, without any evidence of aortic  stenosis.   6. The inferior vena cava is normal in size with greater than 50%  respiratory variability, suggesting right atrial pressure of 3 mmHg.   Chest CTA 10/22/2023: IMPRESSION: 1. No pulmonary emboli. 2. Mild increase in diffuse prominence of the interstitial markings since 01/13/2022, compatible with interstitial pulmonary edema. 3. Moderate-sized right pleural effusion and small to moderate-sized left pleural effusion. 4. Bilateral lower lobe atelectasis. 5. Interval mediastinal and right hilar adenopathy, nonspecific. 6.  Calcific coronary artery and aortic atherosclerosis. 7. Borderline enlarged central pulmonary arteries, suggesting borderline pulmonary arterial hypertension.  Assessment & Plan   1.  Presentation with shortness of breath and acute hypoxic respiratory failure, viral respiratory panel negative despite recent cough. D-dimer 1.23 but subsequent chest CTA negative for pulmonary embolus. BNP 1467 and she has additional evidence of interstitial edema and pleural effusions, being managed for HFpEF.  High-sensitivity troponin I levels do not suggest ACS.  LV and RV function normal by follow-up echocardiogram.  2.  HFpEF exacerbation, LVEF 65 to 70% by follow-up echocardiogram.  Diuretics held after initial IV Lasix and diuresis of approximately 3000 cc with bump in creatinine.  Started on oral Demadex yesterday.  Additional 1300 cc net urine output yesterday.  3.  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  Cardiac enzyme trend does not suggest ACS.  4.  Acute renal insufficiency in the setting of diuresis.  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  Creatinine today is 1.46 with GFR 38.  5.  Carotid artery disease status post right carotid endarterectomy in December 2024.  Would increase activity.  Continue aspirin 81 mg daily and Plavix 75 mg daily. Decrease ProAmatine to 5 mg 3 times a day.  Continue Crestor 40 mg daily and Demadex 40 mg daily.  Add Farxiga 10 mg daily for HFpEF.  No MRA at this point with fluctuating renal insufficiency.  For questions or updates, please contact [facility] Please consult [email or web address] for contact info under   Signed, Nona Dell, MD  10/23/2023, 8:41 AM

## 2023-10-23 NOTE — Assessment & Plan Note (Addendum)
 Renal function today with serum cr at 1,47 with K at 3,7 and serum bicarbonate at 31  Na 137   Add 40 meq Kcl to prevent  hypokalemia Follow up renal function and electrolytes in am.

## 2023-10-23 NOTE — Progress Notes (Signed)
  Progress Note   Patient: [name] VWU:[id number] DOB: 1951/05/07 DOA: 10/20/2023     3 DOS: the patient was seen and examined on 10/23/2023   Brief hospital course: Mrs. [name] was admitted to the hospital with the working diagnosis of acute heart failure decompensation.   73 y.o. female with medical history significant for RA, IPF, CAD, PVD, carotid endarterectomy, hypertension, dyslipidemia, hypothyroidism, and fibromyalgia who presented to the ED with complaints of headache, weakness, nausea, and dizziness that have been ongoing along with some shortness of breath.   EMS was called and she was noted to have significant hypoxemia at 54% on room air.  She was placed on 10 L nasal cannula with improvement.    On her initial physical examination her signs were stable but hypoxemic. Marland Kitchen   She is now requiring 15 L high flow nasal cannula oxygen.  Hemoglobin 10.3 and sodium 133 with troponin of 46 and BNP 1467.  Creatinine 1.28 at baseline.  Chest x-ray showing signs of volume overload and patient has been started on IV Lasix.      Assessment and Plan: * Acute on chronic diastolic CHF (congestive heart failure) (HCC) Echocardiogram with preserved LV systolic function with EF 65 to 70%, mild LVH, RV systolic function preserved, RVSP 34.8 mmHg, no significant valvular disease.   Urine output is 2,840 ml Systolic blood pressure 119 mmHg.   Plan to continue diuresis with torsemide 40 mg po daily.  Continue SGLT 2 inh, and blood pressure support with midodrine.  Ok to transfer to telemetry.   Essential hypertension Continue blood pressure monitoring Continue midodrine for blood pressure support.   CAD (coronary artery disease) No acute coronary syndrome.  Continue with aspirin, clopidogrel and statin   Hyperlipidemia Continue with statin therapy   Chronic kidney disease stage IIIb Renal function with serum cr at 1,46 with K at 3,6 and serum bicarbonate at 33 Na 137   Plan to  continue with torsemide 40- mg po daily Add 40 meq Kcl to prevent hypokalemia.  Follow up on Mg.   Hypothyroidism Continue with levothyroxine.   Rheumatoid arthritis (HCC) No active flare.   PAD (peripheral artery disease) (HCC) Continue statin and antiplatelet therapy   Depression Continue with sertraline and duloxetine.         Subjective: Patient with no chest pain, dyspnea continue to improve, no palpitations.   Physical Exam: Vitals:   10/23/23 0500 10/23/23 0539 10/23/23 0600 10/23/23 0815  BP: (!) 115/44  (!) 105/38   Pulse:      Resp: 13  19   Temp:    98.8 F (37.1 C)  TempSrc:    Oral  SpO2: 96% 96% 92%   Weight: 62.2 kg     Height:       Neurology awake and alert ENT with mild pallor with no icterus Cardiovascular with S1 and S2 present and regular with no gallops, rubs or murmurs No JVD Respiratory with mild rales at bases with no wheezing or rhonchi  Abdomen with no distention  No lower extremity edema  Data Reviewed:    Family Communication: no family at the bedside   Disposition: Status is: Inpatient Remains inpatient appropriate because: diuresis   Planned Discharge Destination: Home    Author: Coralie Keens, MD 10/23/2023 12:30 PM  For on call review [email or web address].

## 2023-10-23 NOTE — Assessment & Plan Note (Signed)
No active flare. °

## 2023-10-23 NOTE — Assessment & Plan Note (Addendum)
 Continue midodrine for blood pressure support, dose has been decreased to 5 mg bid with good toleration.

## 2023-10-24 DIAGNOSIS — I5033 Acute on chronic diastolic (congestive) heart failure: Secondary | ICD-10-CM | POA: Diagnosis not present

## 2023-10-24 DIAGNOSIS — I25110 Atherosclerotic heart disease of native coronary artery with unstable angina pectoris: Secondary | ICD-10-CM | POA: Diagnosis not present

## 2023-10-24 DIAGNOSIS — I10 Essential (primary) hypertension: Secondary | ICD-10-CM | POA: Diagnosis not present

## 2023-10-24 DIAGNOSIS — N1832 Chronic kidney disease, stage 3b: Secondary | ICD-10-CM | POA: Diagnosis not present

## 2023-10-24 LAB — BASIC METABOLIC PANEL WITH GFR
Anion gap: 13 (ref 5–15)
BUN: 27 mg/dL — ABNORMAL HIGH (ref 8–23)
CO2: 31 mmol/L (ref 22–32)
Calcium: 9 mg/dL (ref 8.9–10.3)
Chloride: 93 mmol/L — ABNORMAL LOW (ref 98–111)
Creatinine, Ser: 1.47 mg/dL — ABNORMAL HIGH (ref 0.44–1.00)
GFR, Estimated: 37 mL/min — ABNORMAL LOW (ref >60)
Glucose, Bld: 102 mg/dL — ABNORMAL HIGH (ref 70–99)
Potassium: 3.7 mmol/L (ref 3.5–5.1)
Sodium: 137 mmol/L (ref 135–145)

## 2023-10-24 MED ORDER — ONDANSETRON HCL 4 MG/2ML IJ SOLN
4.0000 mg | Freq: Once | INTRAMUSCULAR | Status: DC
  Filled 2023-10-24 (×2): qty 2

## 2023-10-24 MED ORDER — BISACODYL 5 MG PO TBEC
5.0000 mg | DELAYED_RELEASE_TABLET | Freq: Once | ORAL | Status: AC
  Administered 2023-10-24: 5 mg via ORAL
  Filled 2023-10-24: qty 1

## 2023-10-24 MED ORDER — ONDANSETRON HCL 4 MG/2ML IJ SOLN
4.0000 mg | Freq: Four times a day (QID) | INTRAMUSCULAR | Status: DC | PRN
  Administered 2023-10-24: 4 mg via INTRAVENOUS

## 2023-10-24 MED ORDER — MIDODRINE HCL 5 MG PO TABS
5.0000 mg | ORAL_TABLET | Freq: Two times a day (BID) | ORAL | Status: DC
  Administered 2023-10-24 – 2023-10-26 (×4): 5 mg via ORAL
  Filled 2023-10-24 (×4): qty 1

## 2023-10-24 MED ORDER — POTASSIUM CHLORIDE CRYS ER 20 MEQ PO TBCR
40.0000 meq | EXTENDED_RELEASE_TABLET | Freq: Once | ORAL | Status: AC
  Administered 2023-10-24: 40 meq via ORAL
  Filled 2023-10-24: qty 2

## 2023-10-24 NOTE — Plan of Care (Signed)

## 2023-10-24 NOTE — Plan of Care (Signed)
  Problem: Education: Goal: Knowledge of General Education information will improve Description: Including pain rating scale, medication(s)/side effects and non-pharmacologic comfort measures Outcome: Progressing   Problem: Clinical Measurements: Goal: Ability to maintain clinical measurements within normal limits will improve Outcome: Progressing   Problem: Activity: Goal: Risk for activity intolerance will decrease Outcome: Progressing   Problem: Coping: Goal: Level of anxiety will decrease Outcome: Progressing   Problem: Pain Managment: Goal: General experience of comfort will improve and/or be controlled Outcome: Progressing   Problem: Safety: Goal: Ability to remain free from injury will improve Outcome: Progressing   Problem: Skin Integrity: Goal: Risk for impaired skin integrity will decrease Outcome: Progressing

## 2023-10-24 NOTE — Plan of Care (Signed)

## 2023-10-24 NOTE — Progress Notes (Addendum)
  Progress Note   Patient: [name] WJX:[id number] DOB: 01-18-1951 DOA: 10/20/2023     4 DOS: the patient was seen and examined on 10/24/2023   Brief hospital course: [name] was admitted to the hospital with the working diagnosis of acute heart failure decompensation.   73 y.o. female with medical history significant for RA, IPF, CAD, PVD, carotid endarterectomy, hypertension, dyslipidemia, hypothyroidism, and fibromyalgia who presented to the ED with complaints of headache, weakness, nausea, and dizziness that have been ongoing along with some shortness of breath.   EMS was called and she was noted to have significant hypoxemia at 54% on room air.  She was placed on 10 L nasal cannula with improvement.    On her initial physical examination her signs were stable but hypoxemic. [name]   She is now requiring 15 L high flow nasal cannula oxygen.  Hemoglobin 10.3 and sodium 133 with troponin of 46 and BNP 1467.  Creatinine 1.28 at baseline.  Chest x-ray showing signs of volume overload and patient has been started on IV Lasix.    04/12 patient medically stable for transfer to SNF     Assessment and Plan: * Acute on chronic diastolic CHF (congestive heart failure) (HCC) Echocardiogram with preserved LV systolic function with EF 65 to 70%, mild LVH, RV systolic function preserved, RVSP 34.8 mmHg, no significant valvular disease.   Urine output is 1,600 ml Systolic blood pressure 119 mmHg.   Plan to continue diuresis with torsemide 40 mg po daily.  Continue SGLT 2 inh, and blood pressure support with midodrine, decrease to bid dosing midodrine   Essential hypertension Continue blood pressure monitoring Continue midodrine for blood pressure support.   CAD (coronary artery disease) No acute coronary syndrome.  Continue with aspirin, clopidogrel and statin   Hyperlipidemia Continue with statin therapy   Chronic kidney disease stage IIIb Renal function today with serum cr at 1,47 with  K at 3,7 and serum bicarbonate at 31  Na 137   Add 40 meq Kcl to prevent  hypokalemia Follow up renal function and electrolytes in am.   Hypothyroidism Continue with levothyroxine.   Rheumatoid arthritis (HCC) No active flare.   PAD (peripheral artery disease) (HCC) Continue statin and antiplatelet therapy   Depression Continue with sertraline and duloxetine.        Subjective: patient with no chest pain or dyspnea, no lower extremity edema, continue with supplemental 02 per [name]   Physical Exam: Vitals:   10/24/23 0500 10/24/23 0534 10/24/23 0921 10/24/23 1428  BP: (!) 133/49 125/69 (!) 104/38 101/86  Pulse:  79 81 76  Resp: 14 18 16 20   Temp:  98.2 F (36.8 C) 98.2 F (36.8 C) 99.2 F (37.3 C)  TempSrc:  Oral Oral Oral  SpO2: 96% 95% 97% 98%  Weight:  61.5 kg    Height:       Neurology awake and alert ENT with mild pallor Cardiovascular with S1 and S2 present and regular with no gallops or rubs Respiratory with no rales or wheezing Abdomen with no distention  No lower extremity edema  Data Reviewed:    Family Communication: no family at the bedside   Disposition: Status is: Inpatient Remains inpatient appropriate because: pending transfer to SNF   Planned Discharge Destination: Skilled nursing facility      Author: Albertus Alt, MD 10/24/2023 4:28 PM  For on call review [email or web address].

## 2023-10-25 DIAGNOSIS — I25110 Atherosclerotic heart disease of native coronary artery with unstable angina pectoris: Secondary | ICD-10-CM | POA: Diagnosis not present

## 2023-10-25 DIAGNOSIS — I5033 Acute on chronic diastolic (congestive) heart failure: Secondary | ICD-10-CM | POA: Diagnosis not present

## 2023-10-25 DIAGNOSIS — M797 Fibromyalgia: Secondary | ICD-10-CM

## 2023-10-25 DIAGNOSIS — I10 Essential (primary) hypertension: Secondary | ICD-10-CM | POA: Diagnosis not present

## 2023-10-25 DIAGNOSIS — E782 Mixed hyperlipidemia: Secondary | ICD-10-CM | POA: Diagnosis not present

## 2023-10-25 LAB — CULTURE, BLOOD (ROUTINE X 2)
Culture: NO GROWTH
Culture: NO GROWTH
Special Requests: ADEQUATE

## 2023-10-25 LAB — BASIC METABOLIC PANEL WITH GFR
Anion gap: 14 (ref 5–15)
BUN: 33 mg/dL — ABNORMAL HIGH (ref 8–23)
CO2: 30 mmol/L (ref 22–32)
Calcium: 9.1 mg/dL (ref 8.9–10.3)
Chloride: 94 mmol/L — ABNORMAL LOW (ref 98–111)
Creatinine, Ser: 1.77 mg/dL — ABNORMAL HIGH (ref 0.44–1.00)
GFR, Estimated: 30 mL/min — ABNORMAL LOW (ref >60)
Glucose, Bld: 98 mg/dL (ref 70–99)
Potassium: 3.8 mmol/L (ref 3.5–5.1)
Sodium: 138 mmol/L (ref 135–145)

## 2023-10-25 LAB — MAGNESIUM: Magnesium: 2.7 mg/dL — ABNORMAL HIGH (ref 1.7–2.4)

## 2023-10-25 MED ORDER — BISACODYL 5 MG PO TBEC
10.0000 mg | DELAYED_RELEASE_TABLET | Freq: Once | ORAL | Status: AC
  Administered 2023-10-25: 10 mg via ORAL
  Filled 2023-10-25: qty 2

## 2023-10-25 NOTE — Progress Notes (Signed)
 Progress Note   Patient: [name] ZOX:[id number] DOB: [birth year] DOA: 10/20/2023     5 DOS: the patient was seen and examined on 10/25/2023   Brief hospital course: [name] was admitted to the hospital with the working diagnosis of acute heart failure decompensation.   73 y.o. female with medical history significant for RA, IPF, CAD, PVD, carotid endarterectomy, hypertension, dyslipidemia, hypothyroidism, and fibromyalgia who presented to the ED with complaints of headache, weakness, nausea, and dizziness that have been ongoing along with some shortness of breath.   EMS was called and she was noted to have significant hypoxemia at 54% on room air.  She was placed on 10 L nasal cannula with improvement.    On her initial physical examination her signs were stable but hypoxemic. [name]   She is now requiring 15 L high flow nasal cannula oxygen.  Hemoglobin 10.3 and sodium 133 with troponin of 46 and BNP 1467.  Creatinine 1.28 at baseline.  Chest x-ray showing signs of volume overload and patient has been started on IV Lasix.    04/12 patient medically stable for transfer to SNF     Assessment and Plan: * Acute on chronic diastolic CHF (congestive heart failure) (HCC) Echocardiogram with preserved LV systolic function with EF 65 to 70%, mild LVH, RV systolic function preserved, RVSP 34.8 mmHg, no significant valvular disease.   Patient clinically euvolemic  Systolic blood pressure 115 mmHg.   Limited pharmacologic therapy due to risk of  hypotension  Continue SGLT 2 inh, tolerating well decreased dose of midodrine Hold on torsemide due to rise in serum cr.   Essential hypertension Continue blood pressure monitoring Continue midodrine for blood pressure support., tolerating well reduced dose to bid.   CAD (coronary artery disease) No acute coronary syndrome.  Continue with aspirin, clopidogrel and statin   Hyperlipidemia Continue with statin therapy   Chronic kidney disease  stage IIIb AKI Worsening renal functio with serum cr at 1,7 with K at 3,8 and serum bicarbonate at 30  Na 138 and Mg 2,7   Plan to hold on torsemide for now and follow up renal function and electrolytes in am.   Hypothyroidism Continue with levothyroxine.   Rheumatoid arthritis (HCC) No active flare.   PAD (peripheral artery disease) (HCC) Continue statin and antiplatelet therapy   Depression Continue with sertraline and duloxetine.       Subjective: patient with no chest pain or dyspnea, no PND, orthopnea or lower extremity edema, continue very weak and deconditioned, pending transfer to SNF   Physical Exam: Vitals:   10/24/23 1953 10/25/23 0349 10/25/23 0500 10/25/23 0827  BP: 116/78 107/62  (!) 116/55  Pulse: 76 75  75  Resp: 16 16    Temp: 98.4 F (36.9 C) 98.3 F (36.8 C)  98.7 F (37.1 C)  TempSrc: Oral Oral  Oral  SpO2: 94% 94%  96%  Weight:   63 kg   Height:       Neurology awake and alert ENT with mild pallor Cardiovascular with S1 and S2 present and regular with no gallops or rubs, positive systolic murmur at the right lower sternal border No JVD No  lower extremity edema Respiratory with no rales or wheezing, no rhonchi  Abdomen with no distention  Data Reviewed:    Family Communication: no family at the bedside   Disposition: Status is: Inpatient Remains inpatient appropriate because: pending transfer to SNF   Planned Discharge Destination: Skilled nursing facility  Author: Albertus Alt, MD 10/25/2023 12:46 PM  For on call review [email or web address].

## 2023-10-26 DIAGNOSIS — R1319 Other dysphagia: Secondary | ICD-10-CM | POA: Diagnosis not present

## 2023-10-26 DIAGNOSIS — E782 Mixed hyperlipidemia: Secondary | ICD-10-CM | POA: Diagnosis not present

## 2023-10-26 DIAGNOSIS — I200 Unstable angina: Secondary | ICD-10-CM | POA: Diagnosis not present

## 2023-10-26 DIAGNOSIS — E119 Type 2 diabetes mellitus without complications: Secondary | ICD-10-CM | POA: Diagnosis not present

## 2023-10-26 DIAGNOSIS — N1832 Chronic kidney disease, stage 3b: Secondary | ICD-10-CM | POA: Diagnosis not present

## 2023-10-26 DIAGNOSIS — I25110 Atherosclerotic heart disease of native coronary artery with unstable angina pectoris: Secondary | ICD-10-CM | POA: Diagnosis not present

## 2023-10-26 DIAGNOSIS — I5033 Acute on chronic diastolic (congestive) heart failure: Secondary | ICD-10-CM | POA: Diagnosis not present

## 2023-10-26 DIAGNOSIS — M069 Rheumatoid arthritis, unspecified: Secondary | ICD-10-CM | POA: Diagnosis not present

## 2023-10-26 DIAGNOSIS — R262 Difficulty in walking, not elsewhere classified: Secondary | ICD-10-CM | POA: Diagnosis not present

## 2023-10-26 DIAGNOSIS — M797 Fibromyalgia: Secondary | ICD-10-CM | POA: Diagnosis not present

## 2023-10-26 DIAGNOSIS — K219 Gastro-esophageal reflux disease without esophagitis: Secondary | ICD-10-CM | POA: Diagnosis not present

## 2023-10-26 DIAGNOSIS — M1711 Unilateral primary osteoarthritis, right knee: Secondary | ICD-10-CM | POA: Diagnosis not present

## 2023-10-26 DIAGNOSIS — J84112 Idiopathic pulmonary fibrosis: Secondary | ICD-10-CM | POA: Diagnosis not present

## 2023-10-26 DIAGNOSIS — I739 Peripheral vascular disease, unspecified: Secondary | ICD-10-CM | POA: Diagnosis not present

## 2023-10-26 DIAGNOSIS — I959 Hypotension, unspecified: Secondary | ICD-10-CM | POA: Diagnosis not present

## 2023-10-26 DIAGNOSIS — E039 Hypothyroidism, unspecified: Secondary | ICD-10-CM | POA: Diagnosis not present

## 2023-10-26 DIAGNOSIS — I10 Essential (primary) hypertension: Secondary | ICD-10-CM | POA: Diagnosis not present

## 2023-10-26 DIAGNOSIS — E785 Hyperlipidemia, unspecified: Secondary | ICD-10-CM | POA: Diagnosis not present

## 2023-10-26 DIAGNOSIS — M6281 Muscle weakness (generalized): Secondary | ICD-10-CM | POA: Diagnosis not present

## 2023-10-26 DIAGNOSIS — I2510 Atherosclerotic heart disease of native coronary artery without angina pectoris: Secondary | ICD-10-CM | POA: Diagnosis not present

## 2023-10-26 LAB — BASIC METABOLIC PANEL WITH GFR
Anion gap: 12 (ref 5–15)
BUN: 40 mg/dL — ABNORMAL HIGH (ref 8–23)
CO2: 33 mmol/L — ABNORMAL HIGH (ref 22–32)
Calcium: 9.2 mg/dL (ref 8.9–10.3)
Chloride: 93 mmol/L — ABNORMAL LOW (ref 98–111)
Creatinine, Ser: 1.72 mg/dL — ABNORMAL HIGH (ref 0.44–1.00)
GFR, Estimated: 31 mL/min — ABNORMAL LOW (ref >60)
Glucose, Bld: 99 mg/dL (ref 70–99)
Potassium: 4 mmol/L (ref 3.5–5.1)
Sodium: 138 mmol/L (ref 135–145)

## 2023-10-26 MED ORDER — DAPAGLIFLOZIN PROPANEDIOL 10 MG PO TABS: 10.0000 mg | ORAL_TABLET | Freq: Every day | ORAL | 0 refills | Status: DC

## 2023-10-26 MED ORDER — ROSUVASTATIN CALCIUM 40 MG PO TABS: 40.0000 mg | ORAL_TABLET | Freq: Every day | ORAL | 0 refills | Status: DC

## 2023-10-26 MED ORDER — TORSEMIDE 40 MG PO TABS: 40.0000 mg | ORAL_TABLET | Freq: Every day | ORAL | 0 refills | Status: DC

## 2023-10-26 MED ORDER — BISACODYL 10 MG RE SUPP
10.0000 mg | Freq: Once | RECTAL | Status: AC
Start: 2023-10-26 — End: 2023-10-26
  Administered 2023-10-26: 10 mg via RECTAL
  Filled 2023-10-26: qty 1

## 2023-10-26 MED ORDER — VITAMIN B-12 1000 MCG PO TABS: 1000.0000 ug | ORAL_TABLET | Freq: Every day | ORAL | Status: DC

## 2023-10-26 MED ORDER — TORSEMIDE 20 MG PO TABS: 40.0000 mg | ORAL_TABLET | Freq: Every day | ORAL | Status: DC

## 2023-10-26 MED ORDER — ALPRAZOLAM 0.5 MG PO TABS: 0.5000 mg | ORAL_TABLET | Freq: Three times a day (TID) | ORAL | 0 refills | Status: DC | PRN

## 2023-10-26 MED ORDER — MIDODRINE HCL 5 MG PO TABS: 5.0000 mg | ORAL_TABLET | Freq: Two times a day (BID) | ORAL | 0 refills | Status: DC

## 2023-10-26 NOTE — Discharge Summary (Signed)
 Physician Discharge Summary   Patient: [name] MRN: [id number] DOB: 07/27/1950  Admit date:     10/20/2023  Discharge date: 10/26/23  Discharge Physician: York Ram Magnum Lunde   PCP: Ignatius Specking, MD   Recommendations at discharge:    Patient has been placed on SGLT 2 inh for diastolic heart failure, guideline directed medical therapy limited due to hypotension. Patient has been placed on midodrine for blood pressure support.  Diuresis with torsemide 40 mg po daily.  Follow up renal function and electrolytes in 7 days as outpatient. Follow up with Dr Sherril Croon in 7 to 10 days Follow up with Cardiology as scheduled.   Discharge Diagnoses: Principal Problem:   Acute on chronic diastolic CHF (congestive heart failure) (HCC) Active Problems:   Essential hypertension   CAD (coronary artery disease)   Hyperlipidemia   Chronic kidney disease stage IIIb   Hypothyroidism   Rheumatoid arthritis (HCC)   PAD (peripheral artery disease) (HCC)   Depression   Fibromyalgia   Anxiety state  Resolved Problems:   * No resolved hospital problems. [facility] Course: [name] was admitted to the hospital with the working diagnosis of acute heart failure decompensation.   73 y.o. female with medical history significant for RA, IPF, CAD, PVD, carotid endarterectomy, hypertension, dyslipidemia, hypothyroidism, and fibromyalgia who presented to the ED with complaints of headache, weakness, nausea, and dizziness that have been ongoing along with some shortness of breath.   EMS was called and she was noted to have significant hypoxemia at 54% on room air.  She was placed on 10 L nasal cannula with improvement.  On her initial physical examination her blood pressure was 116/59, HR 98, RR 13 and 02 saturation 61%.  Lungs with wheezing or rhonchi, heart with S1 and S2 present and regular with no gallops, rubs or murmurs, abdomen with no distention and no lower extremity edema.   VBG 7,32/ 54/ 39/  27/ 56% . Na 133, K 4,7 Cl 97 bicarbonate 24 glucose 115 bun 29 cr 1.28  BNP 1,467 High sensitive troponin 46 and 106  Lactic acid 1,8 and 1.1  Wbc 8,1 hgb 10.3 plt 181   Sars covid 19 negative Influenza negative RSV negative   Chest radiograph with cardiomegaly, bilateral hilar vascular congestion, bilateral interstitial infiltrates, more predominant at the lower lobes, small right pleural effusion, fluid in the right fissure.   CT chest with no pulmonary emboli Mild increase diffuse interstitial marking and ground glass opacities.  Moderate sized right and small left pleural effusion. Bilateral lower lobes atelectasis.  Borderline enlarged central pulmonary arteries.   EKG 69 bpm, normal axis, normal intervals, qtc 466, sinus rhythm with no significant ST segment or T wave changes.   Patient was placed on aggressive diuresis with IV furosemide with improvement in her symptoms.  Negative fluid balance was achieved.  Echocardiogram with preserved LV systolic function.  04/12 patient medically stable for transfer to SNF. 04/14 patient has been approved for transfer to SNF, continue oral loop diuretic as outpatient and follow up with primary care and cardiology.     Assessment and Plan: * Acute on chronic diastolic CHF (congestive heart failure) (HCC) Echocardiogram with preserved LV systolic function with EF 65 to 70%, mild LVH, RV systolic function preserved, RVSP 34.8 mmHg, no significant valvular disease.   Patient clinically euvolemic  Systolic blood pressure 115 mmHg.   Limited pharmacologic therapy due to risk of  hypotension  Continue SGLT 2 inh, and midodrine  for blood pressure support.  Loop diuretic with torsemide to keep negative fluid balance.   Essential hypertension Continue midodrine for blood pressure support, dose has been decreased to 5 mg bid with good toleration.   CAD (coronary artery disease) No acute coronary syndrome.  Continue with aspirin,  clopidogrel and statin   Hyperlipidemia Continue with statin therapy   Chronic kidney disease stage IIIb AKI At the time of her discharge her serum cr is 1,72 with K at 4,0 and serum bicarbonate at 33  Na 138 and Mg 2.7   Resume diuresis with torsemide and follow up renal function and electrolytes in 7 days..   Hypothyroidism Continue with levothyroxine.   Rheumatoid arthritis (HCC) No active flare.   PAD (peripheral artery disease) (HCC) Continue statin and antiplatelet therapy   Depression Continue with sertraline and duloxetine.        Consultants: cardiology  Procedures performed: none   Disposition: Skilled nursing facility Diet recommendation:  Cardiac diet DISCHARGE MEDICATION: Allergies as of 10/26/2023       Reactions   Celecoxib Itching   Citalopram Other (See Comments), Palpitations   Heart rate increased   Ciprofloxacin Hcl Other (See Comments)   Mya have made her sick or bad diarrhea   Dicyclomine Diarrhea, Other (See Comments)   Made diarrhea worse   Lamotrigine Diarrhea   Metoclopramide Other (See Comments)   Dizziness   Milnacipran Hcl Other (See Comments)   Unknown   Omeprazole Other (See Comments)   Pregabalin Other (See Comments)   Serotonin Reuptake Inhibitors (ssris) Other (See Comments)   Simvastatin Other (See Comments)   Increased back and muscle pain   Tape Other (See Comments)   Skin gets red and skin pulls off, paper tape only   Tizanidine Other (See Comments)   Unknown    Trazodone And Nefazodone Other (See Comments)   Hallucinations    Venlafaxine Other (See Comments)   Affected eyes, blurred vision   Codeine Itching   Nefazodone Other (See Comments)   Sertraline Hcl Other (See Comments)   Stomach upset. Pt takes this med at home 11/2022        Medication List     STOP taking these medications    amLODipine 10 MG tablet Commonly known as: NORVASC   isosorbide mononitrate 60 MG 24 hr tablet Commonly known as:  IMDUR   metoprolol succinate 100 MG 24 hr tablet Commonly known as: TOPROL-XL       TAKE these medications    albuterol 108 (90 Base) MCG/ACT inhaler Commonly known as: VENTOLIN HFA Inhale 1 puff into the lungs 4 (four) times daily as needed for wheezing or shortness of breath.   ALPRAZolam 0.5 MG tablet Commonly known as: XANAX Take 1 tablet (0.5 mg total) by mouth 3 (three) times daily as needed for anxiety.   aspirin EC 81 MG tablet Take 81 mg by mouth daily.   clopidogrel 75 MG tablet Commonly known as: PLAVIX Take 1 tablet (75 mg total) by mouth daily.   cyanocobalamin 1000 MCG tablet Commonly known as: VITAMIN B12 Take 1 tablet (1,000 mcg total) by mouth daily.   dapagliflozin propanediol 10 MG Tabs tablet Commonly known as: FARXIGA Take 1 tablet (10 mg total) by mouth daily. Start taking on: October 27, 2023   DULoxetine 30 MG capsule Commonly known as: CYMBALTA Take 30 mg by mouth daily.   EPINEPHrine 0.3 mg/0.3 mL Soaj injection Commonly known as: EPI-PEN Inject 0.3 mg into the muscle as needed  for anaphylaxis.   Fish Oil 1000 MG Caps Take 1 capsule by mouth daily.   gentamicin 0.3 % ophthalmic solution Commonly known as: GARAMYCIN Place 2 drops into both eyes in the morning and at bedtime.   levothyroxine 25 MCG tablet Commonly known as: SYNTHROID Take 25 mcg by mouth daily before breakfast.   midodrine 5 MG tablet Commonly known as: PROAMATINE Take 1 tablet (5 mg total) by mouth 2 (two) times daily with a meal.   nitroGLYCERIN 0.4 MG SL tablet Commonly known as: NITROSTAT Place 1 tablet (0.4 mg total) under the tongue every 5 (five) minutes x 3 doses as needed for chest pain (if no relief after 2nd dose, proceed to ED or call 911).   ondansetron 4 MG tablet Commonly known as: ZOFRAN Take 4 mg by mouth 3 (three) times daily as needed for nausea.   Oyster Shell Calcium 500 MG Tabs Take 1 tablet by mouth 2 (two) times daily.   pantoprazole 40  MG tablet Commonly known as: PROTONIX Take 40 mg by mouth daily.   rosuvastatin 40 MG tablet Commonly known as: CRESTOR Take 1 tablet (40 mg total) by mouth daily.   Torsemide 40 MG Tabs Take 40 mg by mouth daily. Start taking on: October 27, 2023   traZODone 100 MG tablet Commonly known as: DESYREL Take 100 mg by mouth at bedtime.   Vitamin D (Ergocalciferol) 1.25 MG (50000 UNIT) Caps capsule Commonly known as: DRISDOL Take 50,000 Units by mouth every Monday.   zinc gluconate 50 MG tablet Take 50 mg by mouth daily.        Contact information for after-discharge care     Destination     [facility] .   Service: Skilled Nursing Contact information: [address] [place]  [address] [phone]                    Discharge Exam: Cleavon Curls Weights   10/24/23 0534 10/25/23 0500 10/26/23 0441  Weight: 61.5 kg 63 kg 62.2 kg   Patient is feeling better, her dyspnea has improved, she has no lower extremity edema, pnd or orthopnea.   Neurology awake and alert ENT with mild pallor with no icterus Cardiovascular with S1 and S2 present and regular with no gallops, rubs or murmurs  No JVD No lower extremity edema Respiratory with no rales or wheezing, no rhonchi  Abdomen with no distention   Condition at discharge: stable  The results of significant diagnostics from this hospitalization (including imaging, microbiology, ancillary and laboratory) are listed below for reference.   Imaging Studies: CT Angio Chest Pulmonary Embolism (PE) W or WO Contrast Result Date: 10/22/2023 CLINICAL DATA:  Shortness of breath.  Elevated D-dimer. EXAM: CT ANGIOGRAPHY CHEST WITH CONTRAST TECHNIQUE: Multidetector CT imaging of the chest was performed using the standard protocol during bolus administration of intravenous contrast. Multiplanar CT image reconstructions and MIPs were obtained to evaluate the vascular anatomy. RADIATION DOSE  REDUCTION: This exam was performed according to the departmental dose-optimization program which includes automated exposure control, adjustment of the mA and/or kV according to patient size and/or use of iterative reconstruction technique. CONTRAST:  75mL OMNIPAQUE IOHEXOL 350 MG/ML SOLN COMPARISON:  Chest radiographs dated 10/20/2023 and 07/27/2023. High-resolution chest CT dated 01/13/2022. FINDINGS: Cardiovascular: Normally opacified pulmonary arteries with no pulmonary arterial filling defects seen. Atheromatous calcifications, including the coronary arteries and aorta. Borderline enlarged central pulmonary arteries. Enlarged heart. No pericardial effusion. Mediastinum/Nodes: Interval enlarged mediastinal and  right hilar lymph nodes. An AP window lymph node has a short axis diameter of 12 mm on image number 32/4. Right precarinal node has a short axis diameter of 18 mm on image number 35/4. A subcarinal node has a short axis diameter of 13 mm on image number 42/4. A right hilar node has a short axis diameter of 12 mm on image number 40/4. Unremarkable thyroid gland, esophagus and trachea. Lungs/Pleura: Mild increase in diffuse prominence of the interstitial markings since 01/13/2022. Moderate-sized right pleural effusion and small to moderate-sized left pleural effusion. Bilateral lower lobe atelectasis. Upper Abdomen: 2 simple appearing left renal cyst and small simple appearing right renal cysts. These do not need imaging follow-up. Cholecystectomy clips. Dense atheromatous arterial calcifications. Musculoskeletal: Thoracic and lower cervical spine degenerative changes. Cervical spine fixation hardware. Review of the MIP images confirms the above findings. IMPRESSION: 1. No pulmonary emboli. 2. Mild increase in diffuse prominence of the interstitial markings since 01/13/2022, compatible with interstitial pulmonary edema. 3. Moderate-sized right pleural effusion and small to moderate-sized left pleural  effusion. 4. Bilateral lower lobe atelectasis. 5. Interval mediastinal and right hilar adenopathy, nonspecific. 6. Calcific coronary artery and aortic atherosclerosis. 7. Borderline enlarged central pulmonary arteries, suggesting borderline pulmonary arterial hypertension. Electronically Signed   By: Beckie Salts M.D.   On: 10/22/2023 12:04   ECHOCARDIOGRAM COMPLETE Result Date: 10/21/2023    ECHOCARDIOGRAM REPORT   Patient Name:   ASPASIA RUDE Date of Exam: 10/21/2023 Medical Rec #:  [id number]      Height:       59.0 in Accession #:    [id number]     Weight:       140.9 lb Date of Birth:  February 12, 1951      BSA:          1.589 m Patient Age:    72 years       BP:           125/44 mmHg Patient Gender: F              HR:           65 bpm. [facility] Procedure: 2D Echo, Cardiac Doppler and Color Doppler (Both Spectral and Color            Flow Doppler were utilized during procedure). Indications:    CHF-Acute Diastolic I50.31  History:        Patient has prior history of Echocardiogram examinations, most                 recent 11/17/2022. CAD; Risk Factors:Hypertension and                 Dyslipidemia. Smoking Hx.  Sonographer:    Celesta Gentile RCS Referring Phys: [id number] PRATIK D [facility] IMPRESSIONS  1. Left ventricular ejection fraction, by estimation, is 65 to 70%. The left ventricle has normal function. The left ventricle has no regional wall motion abnormalities. There is mild concentric left ventricular hypertrophy. Left ventricular diastolic parameters are indeterminate.  2. Right ventricular systolic function is normal. The right ventricular size is normal. There is normal pulmonary artery systolic pressure. The estimated right ventricular systolic pressure is 34.8 mmHg.  3. Left atrial size was mildly dilated.  4. The mitral valve is grossly normal. Trivial mitral valve regurgitation.  5. The aortic valve is tricuspid. There is mild calcification of the aortic valve. Aortic valve regurgitation is  not visualized. Aortic valve sclerosis/calcification is present, without  any evidence of aortic stenosis.  6. The inferior vena cava is normal in size with greater than 50% respiratory variability, suggesting right atrial pressure of 3 mmHg. Comparison(s): Prior images reviewed side by side. LVEF vigorous at 65-70%. FINDINGS  Left Ventricle: Left ventricular ejection fraction, by estimation, is 65 to 70%. The left ventricle has normal function. The left ventricle has no regional wall motion abnormalities. The left ventricular internal cavity size was normal in size. There is  mild concentric left ventricular hypertrophy. Left ventricular diastolic parameters are indeterminate. Right Ventricle: The right ventricular size is normal. No increase in right ventricular wall thickness. Right ventricular systolic function is normal. There is normal pulmonary artery systolic pressure. The tricuspid regurgitant velocity is 2.82 m/s, and  with an assumed right atrial pressure of 3 mmHg, the estimated right ventricular systolic pressure is 34.8 mmHg. Left Atrium: Left atrial size was mildly dilated. Right Atrium: Right atrial size was normal in size. Pericardium: There is no evidence of pericardial effusion. Mitral Valve: The mitral valve is grossly normal. Trivial mitral valve regurgitation. Tricuspid Valve: The tricuspid valve is grossly normal. Tricuspid valve regurgitation is trivial. Aortic Valve: The aortic valve is tricuspid. There is mild calcification of the aortic valve. There is mild aortic valve annular calcification. Aortic valve regurgitation is not visualized. Aortic valve sclerosis/calcification is present, without any evidence of aortic stenosis. Pulmonic Valve: The pulmonic valve was grossly normal. Pulmonic valve regurgitation is trivial. Aorta: The aortic root is normal in size and structure. Venous: The inferior vena cava is normal in size with greater than 50% respiratory variability, suggesting right  atrial pressure of 3 mmHg. IAS/Shunts: No atrial level shunt detected by color flow Doppler. Additional Comments: 3D was performed not requiring image post processing on an independent workstation and was indeterminate.  LEFT VENTRICLE PLAX 2D LVIDd:         4.20 cm   Diastology LVIDs:         2.40 cm   LV e' medial:    6.42 cm/s LV PW:         1.20 cm   LV E/e' medial:  19.3 LV IVS:        1.00 cm   LV e' lateral:   8.27 cm/s LVOT diam:     1.70 cm   LV E/e' lateral: 15.0 LV SV:         59 LV SV Index:   37 LVOT Area:     2.27 cm  RIGHT VENTRICLE RV S prime:     12.20 cm/s TAPSE (M-mode): 2.1 cm LEFT ATRIUM             Index        RIGHT ATRIUM           Index LA diam:        3.50 cm 2.20 cm/m   RA Area:     17.30 cm LA Vol (A2C):   40.9 ml 25.74 ml/m  RA Volume:   46.60 ml  29.32 ml/m LA Vol (A4C):   59.1 ml 37.19 ml/m LA Biplane Vol: 49.9 ml 31.40 ml/m  AORTIC VALVE LVOT Vmax:   98.30 cm/s LVOT Vmean:  74.500 cm/s LVOT VTI:    0.261 m  AORTA Ao Root diam: 2.60 cm MITRAL VALVE                TRICUSPID VALVE MV Area (PHT): 4.89 cm     TR Peak grad:   31.8 mmHg MV  Decel Time: 155 msec     TR Vmax:        282.00 cm/s MV E velocity: 124.00 cm/s MV A velocity: 69.80 cm/s   SHUNTS MV E/A ratio:  1.78         Systemic VTI:  0.26 m                             Systemic Diam: 1.70 cm Teddie Favre MD Electronically signed by Teddie Favre MD Signature Date/Time: 10/21/2023/4:53:44 PM    Final    DG Chest Port 1 View Result Date: 10/20/2023 CLINICAL DATA:  Shortness of breath. EXAM: PORTABLE CHEST 1 VIEW COMPARISON:  July 27, 2023. FINDINGS: Stable cardiomegaly with probable central pulmonary vascular congestion. Bilateral interstitial densities are noted most consistent with pulmonary edema with small pleural effusions. Bony thorax is unremarkable. IMPRESSION: Stable cardiomegaly with probable central pulmonary vascular congestion and bilateral pulmonary edema. Small pleural effusions are noted as well.  Electronically Signed   By: Rosalene Colon M.D.   On: 10/20/2023 11:35    Microbiology: Results for orders placed or performed during the hospital encounter of 10/20/23  Resp panel by RT-PCR (RSV, Flu A&B, Covid) Anterior Nasal Swab     Status: None   Collection Time: 10/20/23 11:23 AM   Specimen: Anterior Nasal Swab  Result Value Ref Range Status   SARS Coronavirus 2 by RT PCR NEGATIVE NEGATIVE Final    Comment: (NOTE) SARS-CoV-2 target nucleic acids are NOT DETECTED.  The SARS-CoV-2 RNA is generally detectable in upper respiratory specimens during the acute phase of infection. The lowest concentration of SARS-CoV-2 viral copies this assay can detect is 138 copies/mL. A negative result does not preclude SARS-Cov-2 infection and should not be used as the sole basis for treatment or other patient management decisions. A negative result may occur with  improper specimen collection/handling, submission of specimen other than nasopharyngeal swab, presence of viral mutation(s) within the areas targeted by this assay, and inadequate number of viral copies(<138 copies/mL). A negative result must be combined with clinical observations, patient history, and epidemiological information. The expected result is Negative.  Fact Sheet for Patients:  BloggerCourse.com  Fact Sheet for Healthcare Providers:  SeriousBroker.it  This test is no t yet approved or cleared by the [place]  FDA and  has been authorized for detection and/or diagnosis of SARS-CoV-2 by FDA under an Emergency Use Authorization (EUA). This EUA will remain  in effect (meaning this test can be used) for the duration of the COVID-19 declaration under Section 564(b)(1) of the Act, 21 U.S.C.section 360bbb-3(b)(1), unless the authorization is terminated  or revoked sooner.       Influenza A by PCR NEGATIVE NEGATIVE Final   Influenza B by PCR NEGATIVE NEGATIVE Final     Comment: (NOTE) The Xpert Xpress SARS-CoV-2/FLU/RSV plus assay is intended as an aid in the diagnosis of influenza from Nasopharyngeal swab specimens and should not be used as a sole basis for treatment. Nasal washings and aspirates are unacceptable for Xpert Xpress SARS-CoV-2/FLU/RSV testing.  Fact Sheet for Patients: BloggerCourse.com  Fact Sheet for Healthcare Providers: SeriousBroker.it  This test is not yet approved or cleared by the [place]  FDA and has been authorized for detection and/or diagnosis of SARS-CoV-2 by FDA under an Emergency Use Authorization (EUA). This EUA will remain in effect (meaning this test can be used) for the duration of the COVID-19 declaration under Section 564(b)(1) of the  Act, 21 U.S.C. section 360bbb-3(b)(1), unless the authorization is terminated or revoked.     Resp Syncytial Virus by PCR NEGATIVE NEGATIVE Final    Comment: (NOTE) Fact Sheet for Patients: BloggerCourse.com  Fact Sheet for Healthcare Providers: SeriousBroker.it  This test is not yet approved or cleared by the [place] FDA and has been authorized for detection and/or diagnosis of SARS-CoV-2 by FDA under an Emergency Use Authorization (EUA). This EUA will remain in effect (meaning this test can be used) for the duration of the COVID-19 declaration under Section 564(b)(1) of the Act, 21 U.S.C. section 360bbb-3(b)(1), unless the authorization is terminated or revoked.  Performed at [facility], [address] [place], [place] [address]   Blood culture (routine x 2)     Status: None   Collection Time: 10/20/23 11:25 AM   Specimen: BLOOD  Result Value Ref Range Status   Specimen Description BLOOD RIGHT ANTECUBITAL  Final   Special Requests   Final    BOTTLES DRAWN AEROBIC AND ANAEROBIC Blood Culture adequate volume   Culture   Final    NO GROWTH 5 DAYS Performed at  [facility], [address], [place], [place] [address]    Report Status 10/25/2023 FINAL  Final  Blood culture (routine x 2)     Status: None   Collection Time: 10/20/23 11:27 AM   Specimen: BLOOD  Result Value Ref Range Status   Specimen Description BLOOD BLOOD RIGHT FOREARM  Final   Special Requests   Final    BOTTLES DRAWN AEROBIC ONLY Blood Culture results may not be optimal due to an inadequate volume of blood received in culture bottles   Culture   Final    NO GROWTH 5 DAYS Performed at [facility], [address] [place], [place] [address]    Report Status 10/25/2023 FINAL  Final  MRSA Next Gen by PCR, Nasal     Status: Abnormal   Collection Time: 10/20/23 12:00 PM   Specimen: Nasal Mucosa; Nasal Swab  Result Value Ref Range Status   MRSA by PCR Next Gen DETECTED (A) NOT DETECTED Final    Comment: RESULT CALLED TO, READ BACK BY AND VERIFIED WITH: EDGAR TINAJERO @ 1731 ON 10/20/23 C VARNER (NOTE) The GeneXpert MRSA Assay (FDA approved for NASAL specimens only), is one component of a comprehensive MRSA colonization surveillance program. It is not intended to diagnose MRSA infection nor to guide or monitor treatment for MRSA infections. Test performance is not FDA approved in patients less than 29 years old. Performed at [facility], [address], [place], [place] [address]     Labs: CBC: Recent Labs  Lab 10/20/23 1125 10/20/23 1255 10/22/23 0430 10/23/23 0439  WBC 8.1 7.8 5.0 5.0  NEUTROABS 6.7  --   --   --   HGB 10.3* 10.6* 10.6* 10.9*  HCT 33.2* 34.3* 33.9* 35.1*  MCV 98.2 96.6 98.3 98.0  PLT 181 192 163 168   Basic Metabolic Panel: Recent Labs  Lab 10/21/23 0439 10/22/23 0430 10/23/23 0439 10/24/23 0501 10/25/23 0509 10/26/23 0520  NA 138 139 137 137 138 138  K 3.4* 3.6 3.6 3.7 3.8 4.0  CL 97* 100 96* 93* 94* 93*  CO2 28 30 33* 31 30 33*  GLUCOSE 108* 110* 97 102* 98 99  BUN 39* 29* 26* 27* 33* 40*  CREATININE 1.81* 1.19* 1.46* 1.47* 1.77*  1.72*  CALCIUM 9.1 8.6* 8.8* 9.0 9.1 9.2  MG 1.8  --   --   --  2.7*  --    Liver Function Tests: Recent Labs  Lab 10/20/23 1125  AST 23  ALT 16  ALKPHOS 70  BILITOT 1.1  PROT 7.1  ALBUMIN 3.4*   CBG: No results for input(s): "GLUCAP" in the last 168 hours.  Discharge time spent: greater than 30 minutes.  Signed: Albertus Alt, MD [facility] 10/26/2023

## 2023-10-26 NOTE — Progress Notes (Signed)
   10/26/23 0800  ReDS Vest / Clip  Station Marker B  Ruler Value 37  ReDS Value Range < 36  ReDS Actual Value 27

## 2023-10-26 NOTE — Progress Notes (Signed)
Report called to [facility]. ?

## 2023-10-26 NOTE — Plan of Care (Signed)
  Problem: Health Behavior/Discharge Planning: Goal: Ability to manage health-related needs will improve Outcome: Progressing   Problem: Clinical Measurements: Goal: Ability to maintain clinical measurements within normal limits will improve Outcome: Progressing Goal: Will remain free from infection Outcome: Progressing Goal: Respiratory complications will improve Outcome: Progressing   Problem: Activity: Goal: Risk for activity intolerance will decrease Outcome: Progressing   Problem: Pain Managment: Goal: General experience of comfort will improve and/or be controlled Outcome: Progressing   Problem: Safety: Goal: Ability to remain free from injury will improve Outcome: Progressing   Problem: Skin Integrity: Goal: Risk for impaired skin integrity will decrease Outcome: Progressing

## 2023-10-26 NOTE — TOC Transition Note (Signed)
 Transition of Care [facility]) - Discharge Note   Patient Details  Name: [name] MRN: [id number] Date of Birth: 1951/06/19  Transition of Care [facility]) CM/SW Contact:  Ander Katos, LCSW Phone Number: 10/26/2023, 11:46 AM   Clinical Narrative:  Pt d/c today to [facility]. Pt and facility aware and agreeable. Pt reports no need to call family. Authorization received.  Facility notified of 30 day PASRR. D/C summary sent to SNF. RN given number to call report. Pt will transport with Quirino Buckles waiver signed and on chart.     Final next level of care: Skilled Nursing Facility Barriers to Discharge: Barriers Resolved   Patient Goals and CMS Choice Patient states their goals for this hospitalization and ongoing recovery are:: rehab CMS Medicare.gov Compare Post Acute Care list provided to:: Patient Choice offered to / list presented to : Patient [place] ownership interest in [facility].provided to:: Patient    Discharge Placement PASRR number recieved: 10/26/23            Patient chooses bed at: Other - please specify in the comment section below: [facility]) Patient to be transferred to facility by: [place] Name of family member notified: pt only Patient and family notified of of transfer: 10/26/23  Discharge Plan and Services Additional resources added to the After Visit Summary for   In-house Referral: Clinical Social Work Discharge Planning Services: CM Consult Post Acute Care Choice: Skilled Nursing Facility                               Social Drivers of Health (SDOH) Interventions SDOH Screenings   Food Insecurity: Patient Declined (10/20/2023)  Recent Concern: Food Insecurity - Food Insecurity Present (07/31/2023)   Received from [facility]  Housing: Patient Declined (10/20/2023)  Transportation Needs: Patient Declined (10/20/2023)  Recent Concern: Transportation Needs - Unmet Transportation Needs (07/31/2023)   Received from [facility]  Utilities: Patient Declined (10/20/2023)  Depression (PHQ2-9): Low Risk  (03/11/2020)  Financial Resource Strain: Medium Risk (07/31/2023)   Received from [facility]  Social Connections: Unknown (10/20/2023)  Tobacco Use: Medium Risk (10/20/2023)  Health Literacy: High Risk (07/31/2023)   Received from [facility]     Readmission Risk Interventions    10/20/2023    8:13 PM  Readmission Risk Prevention Plan  Transportation Screening Complete  PCP or Specialist Appt within 3-5 Days Complete  HRI or Home Care Consult Complete  Social Work Consult for Recovery Care Planning/Counseling Complete  Palliative Care Screening Not Applicable  Medication Review Ocean[employer]) Complete

## 2023-10-27 DIAGNOSIS — I5033 Acute on chronic diastolic (congestive) heart failure: Secondary | ICD-10-CM | POA: Diagnosis not present

## 2023-10-27 DIAGNOSIS — M069 Rheumatoid arthritis, unspecified: Secondary | ICD-10-CM | POA: Diagnosis not present

## 2023-10-27 DIAGNOSIS — I2510 Atherosclerotic heart disease of native coronary artery without angina pectoris: Secondary | ICD-10-CM | POA: Diagnosis not present

## 2023-11-02 NOTE — Progress Notes (Signed)
 [name]  11/02/2023  OTA [name] 07/24/1950 [id number]  Location: RN Hospital Liaison screened the patient remotely at [facility].  Insurance: [employer]   [name] is a 73 y.o. female who is a Primary Care Patient of Vyas, Veneda Gift, MD with [facility]. The patient was screened for readmission hospitalization with noted high risk score for unplanned readmission risk with 2 IP/3 ED in 6 months.  The patient was assessed for potential Care Management service needs for post hospital transition for care coordination. Review of patient's electronic medical record reveals patient was admitted for CHF.Pt recommended for SNF level of care and transitioned to [facility]. Liaison will collaborate with the PAC-RN with [facility] to follow accordingly.    [facility] Care Management/Population Health does not replace or interfere with any arrangements made by the Inpatient Transition of Care team.   For questions contact:   Lilla Reichert, RN, BSN Hospital Liaison [place]   [facility], Population Health Office Hours MTWF  8:00 am-6:00 pm Direct Dial: [phone] mobile [email or web address]

## 2023-11-04 DIAGNOSIS — E782 Mixed hyperlipidemia: Secondary | ICD-10-CM | POA: Diagnosis not present

## 2023-11-04 DIAGNOSIS — I959 Hypotension, unspecified: Secondary | ICD-10-CM | POA: Diagnosis not present

## 2023-11-04 DIAGNOSIS — I5033 Acute on chronic diastolic (congestive) heart failure: Secondary | ICD-10-CM | POA: Diagnosis not present

## 2023-11-11 DIAGNOSIS — I5033 Acute on chronic diastolic (congestive) heart failure: Secondary | ICD-10-CM | POA: Diagnosis not present

## 2023-11-11 DIAGNOSIS — E039 Hypothyroidism, unspecified: Secondary | ICD-10-CM | POA: Diagnosis not present

## 2023-11-11 DIAGNOSIS — M069 Rheumatoid arthritis, unspecified: Secondary | ICD-10-CM | POA: Diagnosis not present

## 2023-11-11 DIAGNOSIS — I2510 Atherosclerotic heart disease of native coronary artery without angina pectoris: Secondary | ICD-10-CM | POA: Diagnosis not present

## 2023-11-11 DIAGNOSIS — M797 Fibromyalgia: Secondary | ICD-10-CM | POA: Diagnosis not present

## 2023-11-11 DIAGNOSIS — N1832 Chronic kidney disease, stage 3b: Secondary | ICD-10-CM | POA: Diagnosis not present

## 2023-11-11 DIAGNOSIS — E785 Hyperlipidemia, unspecified: Secondary | ICD-10-CM | POA: Diagnosis not present

## 2023-11-11 DIAGNOSIS — I10 Essential (primary) hypertension: Secondary | ICD-10-CM | POA: Diagnosis not present

## 2023-11-11 DIAGNOSIS — I959 Hypotension, unspecified: Secondary | ICD-10-CM | POA: Diagnosis not present

## 2023-11-11 DIAGNOSIS — K219 Gastro-esophageal reflux disease without esophagitis: Secondary | ICD-10-CM | POA: Diagnosis not present

## 2023-11-11 DIAGNOSIS — E782 Mixed hyperlipidemia: Secondary | ICD-10-CM | POA: Diagnosis not present

## 2023-11-12 DIAGNOSIS — M6281 Muscle weakness (generalized): Secondary | ICD-10-CM | POA: Diagnosis not present

## 2023-11-13 DIAGNOSIS — I10 Essential (primary) hypertension: Secondary | ICD-10-CM | POA: Diagnosis not present

## 2023-11-13 DIAGNOSIS — Z0000 Encounter for general adult medical examination without abnormal findings: Secondary | ICD-10-CM | POA: Diagnosis not present

## 2023-11-13 DIAGNOSIS — I25119 Atherosclerotic heart disease of native coronary artery with unspecified angina pectoris: Secondary | ICD-10-CM | POA: Diagnosis not present

## 2023-11-13 DIAGNOSIS — I700 Atherosclerosis of aorta: Secondary | ICD-10-CM | POA: Diagnosis not present

## 2023-11-13 DIAGNOSIS — J449 Chronic obstructive pulmonary disease, unspecified: Secondary | ICD-10-CM | POA: Diagnosis not present

## 2023-11-13 DIAGNOSIS — Z299 Encounter for prophylactic measures, unspecified: Secondary | ICD-10-CM | POA: Diagnosis not present

## 2023-11-13 DIAGNOSIS — Z7189 Other specified counseling: Secondary | ICD-10-CM | POA: Diagnosis not present

## 2023-11-16 DIAGNOSIS — M6281 Muscle weakness (generalized): Secondary | ICD-10-CM | POA: Diagnosis not present

## 2023-11-18 DIAGNOSIS — Z299 Encounter for prophylactic measures, unspecified: Secondary | ICD-10-CM | POA: Diagnosis not present

## 2023-11-18 DIAGNOSIS — I779 Disorder of arteries and arterioles, unspecified: Secondary | ICD-10-CM | POA: Diagnosis not present

## 2023-11-18 DIAGNOSIS — I10 Essential (primary) hypertension: Secondary | ICD-10-CM | POA: Diagnosis not present

## 2023-11-18 DIAGNOSIS — I5032 Chronic diastolic (congestive) heart failure: Secondary | ICD-10-CM | POA: Diagnosis not present

## 2023-11-18 DIAGNOSIS — M6281 Muscle weakness (generalized): Secondary | ICD-10-CM | POA: Diagnosis not present

## 2023-11-18 DIAGNOSIS — Z09 Encounter for follow-up examination after completed treatment for conditions other than malignant neoplasm: Secondary | ICD-10-CM | POA: Diagnosis not present

## 2023-11-23 ENCOUNTER — Encounter: Payer: Self-pay | Admitting: Medical

## 2023-11-23 ENCOUNTER — Ambulatory Visit: Attending: Medical | Admitting: Medical

## 2023-11-23 DIAGNOSIS — I10 Essential (primary) hypertension: Secondary | ICD-10-CM | POA: Diagnosis not present

## 2023-11-23 DIAGNOSIS — Z299 Encounter for prophylactic measures, unspecified: Secondary | ICD-10-CM | POA: Diagnosis not present

## 2023-11-23 DIAGNOSIS — R42 Dizziness and giddiness: Secondary | ICD-10-CM | POA: Diagnosis not present

## 2023-11-23 DIAGNOSIS — H00015 Hordeolum externum left lower eyelid: Secondary | ICD-10-CM | POA: Diagnosis not present

## 2023-11-23 DIAGNOSIS — R5383 Other fatigue: Secondary | ICD-10-CM | POA: Diagnosis not present

## 2023-11-23 NOTE — Progress Notes (Deleted)
  Cardiology Office Note:  .   Date:  11/23/2023  ID:  [name], DOB [birth year], MRN [id number] PCP: Orlena Bitters, MD  [facility] Providers Cardiologist:  Teddie Favre, MD {  History of Present Illness: .   [name] is a 73 y.o. female with a h/o CAD, carotid artery disease with severe RICA stenosis, IPF, HTN, renal artery stenosis, GERD, fibromyalgia, RA, hypothyroidism, CKD stage 3, and HLD who presents for hospital follow-up.   Cardiac cath in May 2023 showed left main/multivessel CAD with poor revascularization options.  The patient/CTS and felt not to be a good surgical candidate.  Medical therapy was recommended.  Patient was hospitalized in January 2024 for chest pain.  Left heart cath showed 90% ostial left main stenosis, received PCI to the left main and started on DAPT for 1 year.  The patient was recently admitted for acute on chronic heart failure treated with IV lasix .  Echo showed preserved EF 65 to 70%, mild LVH, normal RVSP, no significant valvular disease.  ROS: ***  Studies Reviewed: .        *** Risk Assessment/Calculations:   {Does this patient have ATRIAL FIBRILLATION?[phone]} No BP recorded.  {Refresh Note OR Click here to enter BP  :1}***       Physical Exam:   VS:  There were no vitals taken for this visit.   Wt Readings from Last 3 Encounters:  10/26/23 137 lb 2 oz (62.2 kg)  07/03/23 146 lb (66.2 kg)  07/02/23 146 lb 6.4 oz (66.4 kg)    GEN: Well nourished, well developed in no acute distress NECK: No JVD; No carotid bruits CARDIAC: ***RRR, no murmurs, rubs, gallops RESPIRATORY:  Clear to auscultation without rales, wheezing or rhonchi  ABDOMEN: Soft, non-tender, non-distended EXTREMITIES:  No edema; No deformity   ASSESSMENT AND PLAN: .   ***    {Are you ordering a CV Procedure (e.g. stress test, cath, DCCV, TEE, etc)?   Press F2        :[id number]}  Dispo: ***  Signed, Uma Jerde Rebekah Canada, PA-C

## 2023-11-24 ENCOUNTER — Emergency Department (HOSPITAL_COMMUNITY)

## 2023-11-24 ENCOUNTER — Other Ambulatory Visit: Payer: Self-pay

## 2023-11-24 ENCOUNTER — Inpatient Hospital Stay (HOSPITAL_COMMUNITY)
Admission: EM | Admit: 2023-11-24 | Discharge: 2023-11-27 | DRG: 683 | Disposition: A | Discharge status: Skilled Nursing Facility | Source: Skilled Nursing Facility | Attending: Internal Medicine | Admitting: Internal Medicine

## 2023-11-24 ENCOUNTER — Encounter (HOSPITAL_COMMUNITY): Payer: Self-pay

## 2023-11-24 DIAGNOSIS — I130 Hypertensive heart and chronic kidney disease with heart failure and stage 1 through stage 4 chronic kidney disease, or unspecified chronic kidney disease: Secondary | ICD-10-CM | POA: Diagnosis not present

## 2023-11-24 DIAGNOSIS — Z7982 Long term (current) use of aspirin: Secondary | ICD-10-CM

## 2023-11-24 DIAGNOSIS — G2581 Restless legs syndrome: Secondary | ICD-10-CM | POA: Diagnosis not present

## 2023-11-24 DIAGNOSIS — M797 Fibromyalgia: Secondary | ICD-10-CM | POA: Diagnosis present

## 2023-11-24 DIAGNOSIS — Z90710 Acquired absence of both cervix and uterus: Secondary | ICD-10-CM

## 2023-11-24 DIAGNOSIS — E876 Hypokalemia: Secondary | ICD-10-CM | POA: Diagnosis not present

## 2023-11-24 DIAGNOSIS — N1832 Chronic kidney disease, stage 3b: Secondary | ICD-10-CM | POA: Diagnosis not present

## 2023-11-24 DIAGNOSIS — I6521 Occlusion and stenosis of right carotid artery: Secondary | ICD-10-CM | POA: Diagnosis present

## 2023-11-24 DIAGNOSIS — Z9049 Acquired absence of other specified parts of digestive tract: Secondary | ICD-10-CM

## 2023-11-24 DIAGNOSIS — Z9842 Cataract extraction status, left eye: Secondary | ICD-10-CM

## 2023-11-24 DIAGNOSIS — Z881 Allergy status to other antibiotic agents status: Secondary | ICD-10-CM

## 2023-11-24 DIAGNOSIS — E782 Mixed hyperlipidemia: Secondary | ICD-10-CM | POA: Diagnosis not present

## 2023-11-24 DIAGNOSIS — Z95820 Peripheral vascular angioplasty status with implants and grafts: Secondary | ICD-10-CM

## 2023-11-24 DIAGNOSIS — Z79890 Hormone replacement therapy (postmenopausal): Secondary | ICD-10-CM

## 2023-11-24 DIAGNOSIS — Z888 Allergy status to other drugs, medicaments and biological substances status: Secondary | ICD-10-CM

## 2023-11-24 DIAGNOSIS — N189 Chronic kidney disease, unspecified: Secondary | ICD-10-CM | POA: Diagnosis not present

## 2023-11-24 DIAGNOSIS — Z886 Allergy status to analgesic agent status: Secondary | ICD-10-CM

## 2023-11-24 DIAGNOSIS — R42 Dizziness and giddiness: Secondary | ICD-10-CM

## 2023-11-24 DIAGNOSIS — I5032 Chronic diastolic (congestive) heart failure: Secondary | ICD-10-CM | POA: Diagnosis present

## 2023-11-24 DIAGNOSIS — N179 Acute kidney failure, unspecified: Principal | ICD-10-CM | POA: Diagnosis present

## 2023-11-24 DIAGNOSIS — M1990 Unspecified osteoarthritis, unspecified site: Secondary | ICD-10-CM | POA: Diagnosis not present

## 2023-11-24 DIAGNOSIS — K219 Gastro-esophageal reflux disease without esophagitis: Secondary | ICD-10-CM | POA: Diagnosis not present

## 2023-11-24 DIAGNOSIS — Z9841 Cataract extraction status, right eye: Secondary | ICD-10-CM

## 2023-11-24 DIAGNOSIS — I10 Essential (primary) hypertension: Secondary | ICD-10-CM | POA: Diagnosis present

## 2023-11-24 DIAGNOSIS — Z9889 Other specified postprocedural states: Secondary | ICD-10-CM

## 2023-11-24 DIAGNOSIS — E860 Dehydration: Secondary | ICD-10-CM | POA: Diagnosis not present

## 2023-11-24 DIAGNOSIS — Z83438 Family history of other disorder of lipoprotein metabolism and other lipidemia: Secondary | ICD-10-CM

## 2023-11-24 DIAGNOSIS — H8110 Benign paroxysmal vertigo, unspecified ear: Secondary | ICD-10-CM | POA: Diagnosis not present

## 2023-11-24 DIAGNOSIS — F32A Depression, unspecified: Secondary | ICD-10-CM | POA: Diagnosis not present

## 2023-11-24 DIAGNOSIS — G4730 Sleep apnea, unspecified: Secondary | ICD-10-CM | POA: Diagnosis not present

## 2023-11-24 DIAGNOSIS — Z7984 Long term (current) use of oral hypoglycemic drugs: Secondary | ICD-10-CM

## 2023-11-24 DIAGNOSIS — Z9851 Tubal ligation status: Secondary | ICD-10-CM

## 2023-11-24 DIAGNOSIS — I739 Peripheral vascular disease, unspecified: Secondary | ICD-10-CM | POA: Diagnosis present

## 2023-11-24 DIAGNOSIS — Z885 Allergy status to narcotic agent status: Secondary | ICD-10-CM

## 2023-11-24 DIAGNOSIS — Z8261 Family history of arthritis: Secondary | ICD-10-CM

## 2023-11-24 DIAGNOSIS — E785 Hyperlipidemia, unspecified: Secondary | ICD-10-CM | POA: Diagnosis present

## 2023-11-24 DIAGNOSIS — M069 Rheumatoid arthritis, unspecified: Secondary | ICD-10-CM | POA: Diagnosis not present

## 2023-11-24 DIAGNOSIS — I951 Orthostatic hypotension: Secondary | ICD-10-CM | POA: Diagnosis not present

## 2023-11-24 DIAGNOSIS — Z833 Family history of diabetes mellitus: Secondary | ICD-10-CM

## 2023-11-24 DIAGNOSIS — Z79899 Other long term (current) drug therapy: Secondary | ICD-10-CM

## 2023-11-24 DIAGNOSIS — I701 Atherosclerosis of renal artery: Secondary | ICD-10-CM | POA: Diagnosis present

## 2023-11-24 DIAGNOSIS — Z87891 Personal history of nicotine dependence: Secondary | ICD-10-CM

## 2023-11-24 DIAGNOSIS — F419 Anxiety disorder, unspecified: Secondary | ICD-10-CM | POA: Diagnosis present

## 2023-11-24 DIAGNOSIS — J984 Other disorders of lung: Secondary | ICD-10-CM | POA: Diagnosis not present

## 2023-11-24 DIAGNOSIS — Z8249 Family history of ischemic heart disease and other diseases of the circulatory system: Secondary | ICD-10-CM

## 2023-11-24 DIAGNOSIS — Z9089 Acquired absence of other organs: Secondary | ICD-10-CM

## 2023-11-24 DIAGNOSIS — E039 Hypothyroidism, unspecified: Secondary | ICD-10-CM | POA: Diagnosis not present

## 2023-11-24 DIAGNOSIS — Z91048 Other nonmedicinal substance allergy status: Secondary | ICD-10-CM

## 2023-11-24 DIAGNOSIS — Z955 Presence of coronary angioplasty implant and graft: Secondary | ICD-10-CM

## 2023-11-24 DIAGNOSIS — Z96652 Presence of left artificial knee joint: Secondary | ICD-10-CM | POA: Diagnosis present

## 2023-11-24 DIAGNOSIS — Z7902 Long term (current) use of antithrombotics/antiplatelets: Secondary | ICD-10-CM

## 2023-11-24 DIAGNOSIS — I2510 Atherosclerotic heart disease of native coronary artery without angina pectoris: Secondary | ICD-10-CM | POA: Diagnosis present

## 2023-11-24 DIAGNOSIS — I252 Old myocardial infarction: Secondary | ICD-10-CM

## 2023-11-24 DIAGNOSIS — Z809 Family history of malignant neoplasm, unspecified: Secondary | ICD-10-CM

## 2023-11-24 DIAGNOSIS — Z981 Arthrodesis status: Secondary | ICD-10-CM

## 2023-11-24 DIAGNOSIS — R531 Weakness: Secondary | ICD-10-CM | POA: Diagnosis not present

## 2023-11-24 LAB — CBC WITH DIFFERENTIAL/PLATELET
Abs Immature Granulocytes: 0.01 10*3/uL (ref 0.00–0.07)
Basophils Absolute: 0 10*3/uL (ref 0.0–0.1)
Basophils Relative: 0 %
Eosinophils Absolute: 0.2 10*3/uL (ref 0.0–0.5)
Eosinophils Relative: 3 %
HCT: 41.1 % (ref 36.0–46.0)
Hemoglobin: 14.4 g/dL (ref 12.0–15.0)
Immature Granulocytes: 0 %
Lymphocytes Relative: 26 %
Lymphs Abs: 1.9 10*3/uL (ref 0.7–4.0)
MCH: 30.8 pg (ref 26.0–34.0)
MCHC: 35 g/dL (ref 30.0–36.0)
MCV: 87.8 fL (ref 80.0–100.0)
Monocytes Absolute: 0.8 10*3/uL (ref 0.1–1.0)
Monocytes Relative: 11 %
Neutro Abs: 4.3 10*3/uL (ref 1.7–7.7)
Neutrophils Relative %: 60 %
Platelets: 149 10*3/uL — ABNORMAL LOW (ref 150–400)
RBC: 4.68 MIL/uL (ref 3.87–5.11)
RDW: 13.2 % (ref 11.5–15.5)
WBC: 7.1 10*3/uL (ref 4.0–10.5)
nRBC: 0 % (ref 0.0–0.2)

## 2023-11-24 LAB — I-STAT CHEM 8, ED
BUN: 36 mg/dL — ABNORMAL HIGH (ref 8–23)
BUN: 37 mg/dL — ABNORMAL HIGH (ref 8–23)
Calcium, Ion: 1.13 mmol/L — ABNORMAL LOW (ref 1.15–1.40)
Calcium, Ion: 1.17 mmol/L (ref 1.15–1.40)
Chloride: 97 mmol/L — ABNORMAL LOW (ref 98–111)
Chloride: 99 mmol/L (ref 98–111)
Creatinine, Ser: 2.4 mg/dL — ABNORMAL HIGH (ref 0.44–1.00)
Creatinine, Ser: 2.6 mg/dL — ABNORMAL HIGH (ref 0.44–1.00)
Glucose, Bld: 103 mg/dL — ABNORMAL HIGH (ref 70–99)
Glucose, Bld: 105 mg/dL — ABNORMAL HIGH (ref 70–99)
HCT: 42 % (ref 36.0–46.0)
HCT: 46 % (ref 36.0–46.0)
Hemoglobin: 14.3 g/dL (ref 12.0–15.0)
Hemoglobin: 15.6 g/dL — ABNORMAL HIGH (ref 12.0–15.0)
Potassium: 2.3 mmol/L — CL (ref 3.5–5.1)
Potassium: 2.4 mmol/L — CL (ref 3.5–5.1)
Sodium: 141 mmol/L (ref 135–145)
Sodium: 141 mmol/L (ref 135–145)
TCO2: 30 mmol/L (ref 22–32)
TCO2: 32 mmol/L (ref 22–32)

## 2023-11-24 LAB — COMPREHENSIVE METABOLIC PANEL WITH GFR
ALT: 14 U/L (ref 0–44)
AST: 23 U/L (ref 15–41)
Albumin: 4.2 g/dL (ref 3.5–5.0)
Alkaline Phosphatase: 65 U/L (ref 38–126)
Anion gap: 11 (ref 5–15)
BUN: 34 mg/dL — ABNORMAL HIGH (ref 8–23)
CO2: 31 mmol/L (ref 22–32)
Calcium: 9.9 mg/dL (ref 8.9–10.3)
Chloride: 99 mmol/L (ref 98–111)
Creatinine, Ser: 2.47 mg/dL — ABNORMAL HIGH (ref 0.44–1.00)
GFR, Estimated: 20 mL/min — ABNORMAL LOW (ref >60)
Glucose, Bld: 106 mg/dL — ABNORMAL HIGH (ref 70–99)
Potassium: 2.3 mmol/L — CL (ref 3.5–5.1)
Sodium: 141 mmol/L (ref 135–145)
Total Bilirubin: 0.4 mg/dL (ref 0.0–1.2)
Total Protein: 7.7 g/dL (ref 6.5–8.1)

## 2023-11-24 LAB — MAGNESIUM: Magnesium: 2.6 mg/dL — ABNORMAL HIGH (ref 1.7–2.4)

## 2023-11-24 LAB — TSH: TSH: 1.816 u[IU]/mL (ref 0.350–4.500)

## 2023-11-24 LAB — PHOSPHORUS: Phosphorus: 3.8 mg/dL (ref 2.5–4.6)

## 2023-11-24 MED ORDER — POTASSIUM CHLORIDE CRYS ER 20 MEQ PO TBCR
40.0000 meq | EXTENDED_RELEASE_TABLET | Freq: Once | ORAL | Status: AC
  Administered 2023-11-24: 40 meq via ORAL
  Filled 2023-11-24: qty 2

## 2023-11-24 MED ORDER — ORAL CARE MOUTH RINSE: 15.0000 mL | OROMUCOSAL | Status: DC | PRN

## 2023-11-24 MED ORDER — HEPARIN SODIUM (PORCINE) 5000 UNIT/ML IJ SOLN
5000.0000 [IU] | Freq: Three times a day (TID) | INTRAMUSCULAR | Status: DC
  Filled 2023-11-24 (×6): qty 1

## 2023-11-24 MED ORDER — PANTOPRAZOLE SODIUM 40 MG PO TBEC
40.0000 mg | DELAYED_RELEASE_TABLET | Freq: Every day | ORAL | Status: DC
Start: 2023-11-24 — End: 2023-11-27
  Administered 2023-11-24 – 2023-11-27 (×4): 40 mg via ORAL
  Filled 2023-11-24 (×4): qty 1

## 2023-11-24 MED ORDER — VITAMIN B-12 1000 MCG PO TABS
1000.0000 ug | ORAL_TABLET | Freq: Every day | ORAL | Status: DC
  Administered 2023-11-25 – 2023-11-27 (×3): 1000 ug via ORAL
  Filled 2023-11-24 (×3): qty 1

## 2023-11-24 MED ORDER — SODIUM CHLORIDE 0.9 % IV BOLUS
500.0000 mL | Freq: Once | INTRAVENOUS | Status: AC
  Administered 2023-11-24: 500 mL via INTRAVENOUS

## 2023-11-24 MED ORDER — ASPIRIN 81 MG PO TBEC
81.0000 mg | DELAYED_RELEASE_TABLET | Freq: Every day | ORAL | Status: DC
Start: 2023-11-24 — End: 2023-11-27
  Administered 2023-11-24 – 2023-11-27 (×4): 81 mg via ORAL
  Filled 2023-11-24 (×4): qty 1

## 2023-11-24 MED ORDER — ALPRAZOLAM 0.5 MG PO TABS
0.5000 mg | ORAL_TABLET | Freq: Three times a day (TID) | ORAL | Status: DC | PRN
  Administered 2023-11-24 – 2023-11-27 (×5): 0.5 mg via ORAL
  Filled 2023-11-24 (×5): qty 1

## 2023-11-24 MED ORDER — LEVOTHYROXINE SODIUM 25 MCG PO TABS
25.0000 ug | ORAL_TABLET | Freq: Every day | ORAL | Status: DC
  Administered 2023-11-25 – 2023-11-27 (×3): 25 ug via ORAL
  Filled 2023-11-24 (×3): qty 1

## 2023-11-24 MED ORDER — ROSUVASTATIN CALCIUM 20 MG PO TABS
40.0000 mg | ORAL_TABLET | Freq: Every day | ORAL | Status: DC
  Administered 2023-11-24 – 2023-11-27 (×4): 40 mg via ORAL
  Filled 2023-11-24 (×4): qty 2

## 2023-11-24 MED ORDER — ACETAMINOPHEN 650 MG RE SUPP: 650.0000 mg | Freq: Four times a day (QID) | RECTAL | Status: DC | PRN

## 2023-11-24 MED ORDER — CLOPIDOGREL BISULFATE 75 MG PO TABS
75.0000 mg | ORAL_TABLET | Freq: Every day | ORAL | Status: DC
  Administered 2023-11-24 – 2023-11-27 (×4): 75 mg via ORAL
  Filled 2023-11-24 (×4): qty 1

## 2023-11-24 MED ORDER — POTASSIUM CHLORIDE 10 MEQ/100ML IV SOLN
10.0000 meq | INTRAVENOUS | Status: AC
  Administered 2023-11-24 (×4): 10 meq via INTRAVENOUS
  Filled 2023-11-24 (×4): qty 100

## 2023-11-24 MED ORDER — ACETAMINOPHEN 325 MG PO TABS
650.0000 mg | ORAL_TABLET | Freq: Four times a day (QID) | ORAL | Status: DC | PRN
  Administered 2023-11-24 – 2023-11-26 (×3): 650 mg via ORAL
  Filled 2023-11-24 (×3): qty 2

## 2023-11-24 MED ORDER — MIDODRINE HCL 5 MG PO TABS
5.0000 mg | ORAL_TABLET | Freq: Three times a day (TID) | ORAL | Status: DC
  Administered 2023-11-24 – 2023-11-27 (×9): 5 mg via ORAL
  Filled 2023-11-24 (×9): qty 1

## 2023-11-24 MED ORDER — PROCHLORPERAZINE EDISYLATE 10 MG/2ML IJ SOLN
10.0000 mg | Freq: Four times a day (QID) | INTRAMUSCULAR | Status: DC | PRN
  Administered 2023-11-24: 10 mg via INTRAVENOUS
  Filled 2023-11-24: qty 2

## 2023-11-24 MED ORDER — DULOXETINE HCL 30 MG PO CPEP: 30.0000 mg | ORAL_CAPSULE | Freq: Every day | ORAL | Status: DC

## 2023-11-24 MED ORDER — SODIUM CHLORIDE 0.9 % IV SOLN: INTRAVENOUS | Status: AC

## 2023-11-24 NOTE — ED Notes (Signed)
 Pt provided with sandwich and diet coke per request and per MD approval

## 2023-11-24 NOTE — Assessment & Plan Note (Signed)
 continue statin

## 2023-11-24 NOTE — ED Notes (Signed)
 A&Ox4. Ambulatory.  Pt stated, "take this IV out my arm or I'll do it myself." Pt is receiving potassium and NS Bolus had finished. This nurse paused infusion and assessed IV site. Site is intact, no edema seen, or redness.

## 2023-11-24 NOTE — ED Notes (Signed)
 Pt stated she "wet the bed"; pt changed into new brief and linens changed on bed; pt comfortable with call light within reach

## 2023-11-24 NOTE — Assessment & Plan Note (Signed)
-  Update TSH -Continue Synthroid. 

## 2023-11-24 NOTE — Assessment & Plan Note (Signed)
-   Currently stable - Patient is compensated and in fact suggesting mild dehydration -Continue heart healthy/low-sodium diet - Holding diuretics and providing gentle fluid resuscitation initially - Will continue daily weights and strict I's and O's to assess her volume status. -Outpatient follow-up with cardiology service.

## 2023-11-24 NOTE — H&P (Addendum)
 History and Physical    Patient: [name]:[id number] DOB: May 07, 1951 DOA: 11/24/2023 DOS: the patient was seen and examined on 11/24/2023  PCP: Orlena Bitters, MD   Patient coming from: Home  Chief Complaint:  Chief Complaint  Patient presents with   Dehydration   HPI: [name] is a 73 y.o. female with medical history significant of orthostatic hypotension, HLD,CKD stage 3b, GERD, hypothyroidism, depression/anxiety and CAD; who presented to the hospital secondary to abnormal labs.   Patient was seen by her PCP and had labs done demonstrating abnormal Cr levels from baseline and she also mentioned feeling lightheaded and dizzy.   Work up demonstrating significant hypokalemia.  No dysuria, hematuria, fever, CP, SOB, nausea, vomiting, focal weakness or any other complaints. Positive orthostasis appreciated.  Patient received IVF's and electrolytes repletion started. TRH contacted to place patient in the hospital for further eval and management.    Review of Systems: As mentioned in the history of present illness. All other systems reviewed and are negative. Past Medical History:  Diagnosis Date   Abnormal chest CT    Anxiety    Arthritis    Rhumatoid and Osteoarthritis   Carotid artery disease (HCC)    Dr. Charlotte Cookey, s/p left CEA, severe R ICA stenosis   Coronary atherosclerosis of native coronary artery    DES RCA/circumflex 4/10, LVEF 65 -> repeat cath 11/2021 with multivessel CAD including LM stenosis, initial med rx   Depression    Essential hypertension    Fibromyalgia    GERD (gastroesophageal reflux disease)    Hiatal hernia    Hyperlipidemia    Kidney cysts    NSTEMI (non-ST elevated myocardial infarction) (HCC)    4/10   Renal artery stenosis (HCC)    Left renal artery stent 12/13   Restless leg syndrome    Sleep apnea    Spondylosis without myelopathy    Tobacco abuse    Past Surgical History:  Procedure Laterality Date   ABDOMINAL HYSTERECTOMY   1977   BREAST LUMPECTOMY     CAROTID ENDARTERECTOMY Left 02/28/2009   CHOLECYSTECTOMY  1991   COLONOSCOPY     COLONOSCOPY N/A 05/08/2017   Procedure: COLONOSCOPY;  Surgeon: Ruby Corporal, MD;  [facility];  Service: Endoscopy;  Laterality: N/A;  12:00   CORONARY ATHERECTOMY N/A 11/19/2022   Procedure: CORONARY ATHERECTOMY;  Surgeon: Wenona Hamilton, MD;  Location: [facility] INVASIVE CV LAB;  Service: Cardiovascular;  Laterality: N/A;   CORONARY STENT INTERVENTION N/A 11/19/2022   Procedure: CORONARY STENT INTERVENTION;  Surgeon: Wenona Hamilton, MD;  Location: [facility] INVASIVE CV LAB;  Service: Cardiovascular;  Laterality: N/A;   CORONARY ULTRASOUND/IVUS N/A 11/19/2022   Procedure: Coronary Ultrasound/IVUS;  Surgeon: Wenona Hamilton, MD;  [facility]: [facility] INVASIVE CV LAB;  Service: Cardiovascular;  Laterality: N/A;   ENDARTERECTOMY Right 07/03/2023   Procedure: ENDARTERECTOMY CAROTID WITH XENOSURE BIOLOGIC PATCH ANGIOPLASTY;  Surgeon: Margherita Shell, MD;  Location: [facility] OR;  Service: Vascular;  Laterality: Right;   ESOPHAGOGASTRODUODENOSCOPY N/A 04/07/2013   Procedure: ESOPHAGOGASTRODUODENOSCOPY (EGD);  Surgeon: Ruby Corporal, MD;  [facility];  Service: Endoscopy;  Laterality: N/A;  250   ESOPHAGOGASTRODUODENOSCOPY N/A 02/02/2014   Procedure: ESOPHAGOGASTRODUODENOSCOPY (EGD);  Surgeon: Ruby Corporal, MD;  [facility];  Service: Endoscopy;  Laterality: N/A;  120   EYE SURGERY     Catartact bilateral   INSERTION OF RETROGRADE CAROTID STENT Right 07/03/2023   Procedure: Right common carotid  angiogram;  Surgeon: Margherita Shell, MD;  Location: [facility] OR;  Service: Vascular;  Laterality: Right;   JOINT REPLACEMENT  March 2013   Left knee   JOINT REPLACEMENT  2006   Left Knee   kidney stent     Left December 2013   LEFT HEART CATH AND CORONARY ANGIOGRAPHY N/A 11/13/2021   Procedure: LEFT HEART CATH AND CORONARY ANGIOGRAPHY;  Surgeon: Wenona Hamilton, MD;  [facility]  INVASIVE CV LAB;  Service: Cardiovascular;  Laterality: N/A;   LEFT HEART CATH AND CORONARY ANGIOGRAPHY N/A 11/18/2022   Procedure: LEFT HEART CATH AND CORONARY ANGIOGRAPHY;  Surgeon: Arleen Lacer, MD;  Location: [facility] INVASIVE CV LAB;  Service: Cardiovascular;  Laterality: N/A;   LUMBAR DISC SURGERY     L5-S1   MULTIPLE TOOTH EXTRACTIONS  Jan. 2015   Neck Fusion  1996   C4 to C6   PERCUTANEOUS STENT INTERVENTION Left 06/15/2012   Procedure: PERCUTANEOUS STENT INTERVENTION;  Surgeon: Margherita Shell, MD;  Location: [facility] CATH LAB;  Service: Cardiovascular;  Laterality: Left;  renal   PERIPHERAL VASCULAR BALLOON ANGIOPLASTY  05/26/2017   Procedure: PERIPHERAL VASCULAR BALLOON ANGIOPLASTY;  Surgeon: Margherita Shell, MD;  [facility];  Service: Cardiovascular;;  Lt. Renal Angioplasty   POLYPECTOMY  05/08/2017   Procedure: POLYPECTOMY;  Surgeon: Ruby Corporal, MD;  [facility];  Service: Endoscopy;;  colon    RENAL ANGIOGRAM N/A 06/15/2012   Procedure: RENAL ANGIOGRAM;  Surgeon: Margherita Shell, MD;  Location: [facility] CATH LAB;  Service: Cardiovascular;  Laterality: N/A;   RENAL ANGIOGRAPHY  05/26/2017   Procedure: RENAL ANGIOGRAPHY;  Surgeon: Margherita Shell, MD;  [facility] INVAS[facility] CV LAB;  Service: Cardiovascular;;   SPINE SURGERY  1994   Ruptured disc   TEMPORARY PACEMAKER N/A 11/19/2022   Procedure: TEMPORARY PACEMAKER;  Surgeon: Wenona Hamilton, MD;  [facility] INVAS[facility] CV LAB;  Service: Cardiovascular;  Laterality: N/A;   TONSILECTOMY, ADENOIDECTOMY, BILATERAL MYRINGOTOMY AND TUBES  1969   TOTAL KNEE ARTHROPLASTY     Left   TUBAL LIGATION     Social History:  reports that she quit smoking about 21 months ago. Her smoking use included cigarettes. She started smoking about 46 years ago. She has never used smokeless tobacco. She reports that she does not drink alcohol  and does not use drugs.  Allergies  Allergen Reactions   Celecoxib Itching   Citalopram  Other (See Comments) and Palpitations    Heart rate increased   Ciprofloxacin Hcl Other (See Comments)    Mya have made her sick or bad diarrhea   Dicyclomine  Diarrhea and Other (See Comments)    Made diarrhea worse   Lamotrigine Diarrhea   Metoclopramide Other (See Comments)    Dizziness   Milnacipran Hcl Other (See Comments)    Unknown   Omeprazole Other (See Comments)   Pregabalin Other (See Comments)   Serotonin Reuptake Inhibitors (Ssris) Other (See Comments)   Simvastatin  Other (See Comments)    Increased back and muscle pain   Tape Other (See Comments)    Skin gets red and skin pulls off, paper tape only   Tizanidine Other (See Comments)    Unknown    Trazodone  And Nefazodone Other (See Comments)    Hallucinations    Venlafaxine Other (See Comments)    Affected eyes, blurred vision   Codeine Itching   Nefazodone Other (See Comments)   Sertraline  Hcl Other (See Comments)  Stomach upset. Pt takes this med at home 11/2022    Family History  Problem Relation Age of Onset   Other Mother        Cerebral hemorrage   Heart disease Mother    Heart attack Mother    Hyperlipidemia Mother    Heart disease Father    Heart attack Father    Hyperlipidemia Father    Heart disease Brother        Heart Disease before age 45   Diabetes Brother    Hypertension Brother    Heart attack Brother    Cancer Sister    Arthritis Sister    Heart disease Sister    Heart disease Daughter 11       Heart Disease before age 71   Heart attack Daughter    Hypertension Daughter    Cancer Sister    Arthritis Sister    Diabetes Brother        Varicose  Veins   Peripheral vascular disease Brother    Diabetes Brother     Prior to Admission medications   Medication Sig Start Date End Date Taking? Authorizing Provider  acetaminophen  (TYLENOL ) 325 MG tablet Take 325 mg by mouth every 6 (six) hours as needed for mild pain (pain score 1-3). 11/11/23   [provider]  albuterol   (VENTOLIN  HFA) 108 (90 Base) MCG/ACT inhaler Inhale 1 puff into the lungs 4 (four) times daily as needed for wheezing or shortness of breath. 04/22/21   [provider]  ALPRAZolam  (XANAX ) 0.5 MG tablet Take 1 tablet (0.5 mg total) by mouth 3 (three) times daily as needed for anxiety. 10/26/23   Arrien, Mauricio Daniel, MD  amoxicillin (AMOXIL) 500 MG capsule Take 500 mg by mouth 3 (three) times daily. 11/23/23   [provider]  aspirin  81 MG EC tablet Take 81 mg by mouth daily.    [provider]  clopidogrel  (PLAVIX ) 75 MG tablet Take 1 tablet (75 mg total) by mouth daily. 12/25/22   Gerard Knight, MD  cyanocobalamin  (VITAMIN B12) 1000 MCG tablet Take 1 tablet (1,000 mcg total) by mouth daily. 10/26/23   Arrien, Mauricio Daniel, MD  dapagliflozin  propanediol (FARXIGA ) 10 MG TABS tablet Take 1 tablet (10 mg total) by mouth daily. 10/27/23   Arrien, Curlee Doss, MD  DULoxetine  (CYMBALTA ) 30 MG capsule Take 30 mg by mouth daily. 09/25/23   [provider]  EPINEPHrine 0.3 mg/0.3 mL IJ SOAJ injection Inject 0.3 mg into the muscle as needed for anaphylaxis. 07/13/23   [provider]  gentamicin (GARAMYCIN) 0.3 % ophthalmic solution Place 2 drops into both eyes in the morning and at bedtime. 09/15/23   [provider]  levothyroxine  (SYNTHROID ) 25 MCG tablet Take 25 mcg by mouth daily before breakfast. 02/16/20   [provider]  meclizine (ANTIVERT) 12.5 MG tablet Take 12.5 mg by mouth 2 (two) times daily. 11/23/23   [provider]  methocarbamol (ROBAXIN) 500 MG tablet Take 500 mg by mouth every 6 (six) hours as needed for muscle spasms. 11/23/23   [provider]  midodrine  (PROAMATINE ) 5 MG tablet Take 1 tablet (5 mg total) by mouth 2 (two) times daily with a meal. 10/26/23 11/25/23  Arrien, Curlee Doss, MD  nitroGLYCERIN  (NITROSTAT ) 0.4 MG SL tablet Place 1 tablet (0.4 mg total) under the tongue every 5 (five) minutes x  3 doses as needed for chest pain (if no relief after 2nd dose, proceed to ED or call  [phone]). 06/08/23   Gerard Knight, MD  Omega-3 Fatty Acids (FISH OIL) 1000 MG CAPS Take 1 capsule by mouth daily. 09/22/23   [provider]  ondansetron  (ZOFRAN ) 4 MG tablet Take 4 mg by mouth 3 (three) times daily as needed for nausea. 06/03/21   [provider]  Glynda Lash Calcium  500 MG TABS Take 1 tablet by mouth 2 (two) times daily. 09/22/23   [provider]  pantoprazole  (PROTONIX ) 40 MG tablet Take 40 mg by mouth daily. 10/08/22   [provider]  rosuvastatin  (CRESTOR ) 40 MG tablet Take 1 tablet (40 mg total) by mouth daily. 10/26/23   Arrien, Mauricio Daniel, MD  torsemide  (DEMADEX ) 20 MG tablet Take 20 mg by mouth 2 (two) times daily. 11/11/23   [provider]  traZODone  (DESYREL ) 100 MG tablet Take 100 mg by mouth at bedtime. 09/30/23   [provider]  Vitamin D , Ergocalciferol , (DRISDOL ) 50000 UNITS CAPS Take 50,000 Units by mouth every Monday.     [provider]  zinc  gluconate 50 MG tablet Take 50 mg by mouth daily. 09/22/23   [provider]    Physical Exam: Vitals:   11/24/23 1029 11/24/23 1200 11/24/23 1331 11/24/23 1653  BP:   99/60 (!) 130/53  Pulse: 92 96 (!) 103 95  Resp:  17 20 16   Temp:   98 F (36.7 C) 98 F (36.7 C)  TempSrc:   Oral Oral  SpO2: 95% 95% 96% 96%  Weight:      Height:       General exam: Alert, awake, oriented x 3; in no acute distress. Respiratory system: Clear to auscultation. Respiratory effort normal. Good sat on RA. Cardiovascular system:RRR. No rubs or gallops. Gastrointestinal system: Abdomen is nondistended, soft and nontender. No organomegaly or masses felt. Normal bowel sounds heard. Central nervous system: No focal neurological deficits. Extremities: No cyanosis, no clubbing. Skin: No petechiae. Psychiatry: Judgement and insight appear normal.   Data Reviewed: Comprehensive  metabolic panel: Sodium 141, potassium 2.3, chloride 99, CO2 31, BUN 34, creatinine 2.47, LFTs within normal limits and GFR 20 CBC: WBC 7.1, hemoglobin 14.4 and platelet count 149K Mg: 2.6  Assessment and Plan: Acute kidney injury superimposed on CKD (HCC) -hold nephrotoxic agents  -provide fluid resuscitation -follow renal function trend and if fails to improved will check US  -will avoid hypotension and contrast    Chronic diastolic HF (heart failure) (HCC) - Currently stable - Patient is compensated and in fact suggesting mild dehydration -Continue heart healthy/low-sodium diet - Holding diuretics and providing gentle fluid resuscitation initially - Will continue daily weights and strict I's and O's to assess her volume status. -Outpatient follow-up with cardiology service.  Hypothyroidism - Update TSH - Continue Synthroid .  Depression with anxiety -stable mood currently -Continue as needed Xanax  -No suicidal ideation or hallucinations. -not longer on cymbalta  per med-rec.  GERD (gastroesophageal reflux disease) -continue PPI  Essential hypertension -Patient's blood pressure soft and currently needing midodrine  -will follow vital signs   Hyperlipidemia -continue statin     Advance Care Planning:   Code Status: Full Code   Consults: None  Family Communication: See below for x-ray reports  Severity of Illness: The appropriate patient status for this patient is OBSERVATION. Observation status is judged to be reasonable and necessary in order to provide the required intensity of service to ensure the patient's safety. The patient's presenting symptoms, physical exam findings, and initial radiographic and laboratory data in the context of  their medical condition is felt to place them at decreased risk for further clinical deterioration. Furthermore, it is anticipated that the patient will be medically stable for discharge from the hospital within 2 midnights of admission.    Author: Justina Oman, MD 11/24/2023 4:55 PM  For on call review [email or web address].

## 2023-11-24 NOTE — Assessment & Plan Note (Signed)
 continue PPI

## 2023-11-24 NOTE — Assessment & Plan Note (Signed)
-  Patient's blood pressure soft and currently needing midodrine  -will follow vital signs

## 2023-11-24 NOTE — Assessment & Plan Note (Signed)
-  hold nephrotoxic agents  -provide fluid resuscitation -follow renal function trend and if fails to improved will check US  -will avoid hypotension and contrast

## 2023-11-24 NOTE — TOC Initial Note (Signed)
 Transition of Care [facility]) - Initial/Assessment Note    Patient Details  Name: [name] MRN: [id number] Date of Birth: 1951/02/12  Transition of Care [facility]) CM/SW Contact:    Geraldina Klinefelter, RN Phone Number: 11/24/2023, 9:15 PM  Clinical Narrative:                 Pt from [facility] where she has been a resident for several months. Pt states she gets PT 2-3 x weekly. She has a cane, walker, wc and plans to return to [place] at dc.   Expected Discharge Plan: Long Term Nursing Home Barriers to Discharge: Continued Medical Work up  Patient Goals and CMS Choice Patient states their goals for this hospitalization and ongoing recovery are:: To get better. Pt states she's been "falling apart" for the past few months. CMS Medicare.gov Compare Post Acute Care list provided to:: Patient Choice offered to / list presented to : Patient [place] ownership interest in [facility].provided to:: Patient   Expected Discharge Plan and Services   Post Acute Care Choice: Skilled Nursing Facility Living arrangements for the past 2 months: Skilled Nursing Facility  Prior Living Arrangements/Services Living arrangements for the past 2 months: Skilled Nursing Facility Lives with:: Facility Resident Patient language and need for interpreter reviewed:: Yes Do you feel safe going back to the place where you live?: Yes      Need for Family Participation in Patient Care: No (Comment) Care giver support system in place?: Yes (comment)   Criminal Activity/Legal Involvement Pertinent to Current Situation/Hospitalization: No - Comment as needed  Activities of Daily Living  Permission Sought/Granted  Emotional Assessment Appearance:: Appears older than stated age Attitude/Demeanor/Rapport: Engaged Affect (typically observed): Appropriate Orientation: : Oriented to Self, Oriented to Place, Oriented to Situation Alcohol  / Substance Use: Not Applicable Psych  Involvement: No (comment)  Admission diagnosis:  Hypokalemia [E87.6] Dizziness [R42] AKI (acute kidney injury) (HCC) [N17.9] Acute kidney injury superimposed on CKD (HCC) [N17.9, N18.9] Patient Active Problem List   Diagnosis Date Noted   Acute kidney injury superimposed on CKD (HCC) 11/24/2023   Chronic diastolic HF (heart failure) (HCC) 10/23/2023   Acute hypoxemic respiratory failure (HCC) 10/20/2023   Status post carotid endarterectomy 07/03/2023   Symptomatic carotid artery stenosis, right 07/03/2023   Anxiety state 06/10/2023   Relational problem 06/10/2023   Acute coronary syndrome with high troponin (HCC) 11/20/2022   Chronic kidney disease stage IIIb 11/18/2022   Obesity (BMI 30-39.9) 11/18/2022   Rheumatoid arthritis (HCC) 11/17/2022   Fibromyalgia 11/17/2022   Hypothyroidism 11/17/2022   Progressive angina with severe left main stenosis 11/16/2022   IPF (idiopathic pulmonary fibrosis) (HCC) 02/25/2022   Abdominal pain, epigastric 11/26/2021   Nausea without vomiting 11/26/2021   Constipation 11/26/2021   Tobacco abuse 11/16/2021   Abnormal chest CT 11/16/2021   Abnormal nuclear cardiac imaging test    Mixed incontinence 07/16/2021   History of UTI 07/16/2021   AMS (altered mental status) 03/11/2020   AKI (acute kidney injury) (HCC) 03/11/2020   Hypokalemia 03/11/2020   Altered mental status 03/11/2020   Depression    Prolonged QT interval    Unilateral primary osteoarthritis, right knee 02/22/2020   Gastroesophageal reflux disease without esophagitis 01/25/2018   Encounter for colonoscopy due to history of adenomatous colonic polyps 03/23/2017   Melena 01/31/2014   Aftercare following surgery of the circulatory system, NEC 01/30/2014   Tooth pain 05/03/2013   GERD (gastroesophageal reflux disease) 02/16/2013   Bloating  02/16/2013   Renal artery stenosis (HCC) 11/04/2012   PAD (peripheral artery disease) (HCC) 05/07/2012   Shortness of breath 04/14/2012    Carotid artery stenosis 11/13/2009   Hyperlipidemia 04/19/2009   Essential hypertension 03/12/2009   CAD (coronary artery disease) 03/12/2009   PCP:  Orlena Bitters, MD Pharmacy:   [facility] [place], [place] - [address] [name] [address] Phone: [phone] Fax: [phone]  [facility] [phone] - [place], [place] - [address] AT [facility] [address] [place] [address] Phone: [phone] Fax: [phone]  [facility] [address] [place] [address] Phone: [phone] Fax: [phone]  [place], [place] - [address] [place] [address] Phone: [phone] Fax: [phone]  Social Drivers of Health (SDOH) Social History: SDOH Screenings   Food Insecurity: No Food Insecurity (11/24/2023)  Housing: Low Risk  (11/24/2023)  Transportation Needs: No Transportation Needs (11/24/2023)  Utilities: Not At Risk (11/24/2023)  Depression (PHQ2-9): Low Risk  (03/11/2020)  Financial Resource Strain: Medium Risk (07/31/2023)   Received from [facility]  Social Connections: Unknown (10/20/2023)  Tobacco Use: Medium Risk (11/24/2023)  Health Literacy: High Risk (07/31/2023)   Received from [facility]   SDOH Interventions:   Readmission Risk Interventions    10/20/2023    8:13 PM  Readmission Risk Prevention Plan  Transportation Screening Complete  PCP or Specialist Appt within 3-5 Days Complete  HRI or Home Care Consult Complete  Social Work Consult for Recovery Care Planning/Counseling Complete  Palliative Care Screening Not Applicable  Medication Review Oceanographer) Complete

## 2023-11-24 NOTE — ED Provider Notes (Signed)
 [place] EMERGENCY DEPARTMENT AT [facility] Provider Note   CSN: [id number] Arrival date & time: 11/24/23  0946     History  Chief Complaint  Patient presents with   Dehydration    [name] is a 73 y.o. female.  She was sent in by her primary care doctor after getting labs done yesterday.  Reportedly had a low potassium.  Patient states she has been eating and drinking well.  No vomiting or diarrhea. Very dizzy, lightheaded, at rest and worse with ambulation.  No urinary symptoms.  Baseline shortness of breath.  She was just admitted last month for congestive heart failure.  Had midodrine  added for low blood pressures.  Currently at SNF  The history is provided by the patient and a caregiver.       Home Medications Prior to Admission medications   Medication Sig Start Date End Date Taking? Authorizing Provider  albuterol  (VENTOLIN  HFA) 108 (90 Base) MCG/ACT inhaler Inhale 1 puff into the lungs 4 (four) times daily as needed for wheezing or shortness of breath. 04/22/21   [provider]  ALPRAZolam  (XANAX ) 0.5 MG tablet Take 1 tablet (0.5 mg total) by mouth 3 (three) times daily as needed for anxiety. 10/26/23   Arrien, Curlee Doss, MD  aspirin  81 MG EC tablet Take 81 mg by mouth daily.    [provider]  clopidogrel  (PLAVIX ) 75 MG tablet Take 1 tablet (75 mg total) by mouth daily. 12/25/22   Gerard Knight, MD  cyanocobalamin  (VITAMIN B12) 1000 MCG tablet Take 1 tablet (1,000 mcg total) by mouth daily. 10/26/23   Arrien, Curlee Doss, MD  dapagliflozin  propanediol (FARXIGA ) 10 MG TABS tablet Take 1 tablet (10 mg total) by mouth daily. 10/27/23   Arrien, Curlee Doss, MD  DULoxetine  (CYMBALTA ) 30 MG capsule Take 30 mg by mouth daily. 09/25/23   [provider]  EPINEPHrine 0.3 mg/0.3 mL IJ SOAJ injection Inject 0.3 mg into the muscle as needed for anaphylaxis. 07/13/23   [provider]  gentamicin (GARAMYCIN) 0.3 %  ophthalmic solution Place 2 drops into both eyes in the morning and at bedtime. 09/15/23   [provider]  levothyroxine  (SYNTHROID ) 25 MCG tablet Take 25 mcg by mouth daily before breakfast. 02/16/20   [provider]  midodrine  (PROAMATINE ) 5 MG tablet Take 1 tablet (5 mg total) by mouth 2 (two) times daily with a meal. 10/26/23 11/25/23  Arrien, Curlee Doss, MD  nitroGLYCERIN  (NITROSTAT ) 0.4 MG SL tablet Place 1 tablet (0.4 mg total) under the tongue every 5 (five) minutes x 3 doses as needed for chest pain (if no relief after 2nd dose, proceed to ED or call 911). 06/08/23   Gerard Knight, MD  Omega-3 Fatty Acids (FISH OIL) 1000 MG CAPS Take 1 capsule by mouth daily. 09/22/23   [provider]  ondansetron  (ZOFRAN ) 4 MG tablet Take 4 mg by mouth 3 (three) times daily as needed for nausea. 06/03/21   [provider]  Glynda Lash Calcium  500 MG TABS Take 1 tablet by mouth 2 (two) times daily. 09/22/23   [provider]  pantoprazole  (PROTONIX ) 40 MG tablet Take 40 mg by mouth daily. 10/08/22   [provider]  rosuvastatin  (CRESTOR ) 40 MG tablet Take 1 tablet (40 mg total) by mouth daily. 10/26/23   Arrien, Curlee Doss, MD  torsemide  40 MG TABS Take 40 mg by mouth daily. 10/27/23 11/26/23  Arrien, Curlee Doss, MD  traZODone  (DESYREL ) 100  MG tablet Take 100 mg by mouth at bedtime. 09/30/23   [provider]  Vitamin D , Ergocalciferol , (DRISDOL ) 50000 UNITS CAPS Take 50,000 Units by mouth every Monday.     [provider]  zinc  gluconate 50 MG tablet Take 50 mg by mouth daily. 09/22/23   [provider]      Allergies    Celecoxib, Citalopram, Ciprofloxacin hcl, Dicyclomine , Lamotrigine, Metoclopramide, Milnacipran hcl, Omeprazole, Pregabalin, Serotonin reuptake inhibitors (ssris), Simvastatin , Tape, Tizanidine, Trazodone  and nefazodone, Venlafaxine, Codeine, Nefazodone, and Sertraline  hcl    Review of Systems    Review of Systems  Constitutional:  Negative for fever.  HENT:  Negative for sore throat.   Respiratory:  Positive for shortness of breath (baseline).   Cardiovascular:  Negative for chest pain.  Gastrointestinal:  Negative for abdominal pain.  Genitourinary:  Negative for dysuria.  Skin:  Negative for rash.    Physical Exam Updated Vital Signs BP 100/76 (BP Location: Right Arm)   Pulse 72   Temp (!) 97.5 F (36.4 C) (Temporal)   Resp 16   Ht 5' (1.524 m)   Wt 62 kg   SpO2 98%   BMI 26.69 kg/m  Physical Exam Vitals and nursing note reviewed.  Constitutional:      General: She is not in acute distress.    Appearance: Normal appearance. She is well-developed.  HENT:     Head: Normocephalic and atraumatic.  Eyes:     Conjunctiva/sclera: Conjunctivae normal.  Cardiovascular:     Rate and Rhythm: Regular rhythm. Tachycardia present.     Heart sounds: Murmur heard.  Pulmonary:     Effort: Pulmonary effort is normal. No respiratory distress.     Breath sounds: Normal breath sounds. No stridor. No wheezing.  Abdominal:     Palpations: Abdomen is soft.     Tenderness: There is no abdominal tenderness. There is no guarding or rebound.  Musculoskeletal:        General: No tenderness.     Cervical back: Neck supple.  Skin:    General: Skin is warm and dry.  Neurological:     General: No focal deficit present.     Mental Status: She is alert.     GCS: GCS eye subscore is 4. GCS verbal subscore is 5. GCS motor subscore is 6.     Sensory: No sensory deficit.     Motor: No weakness.     ED Results / Procedures / Treatments   Labs (all labs ordered are listed, but only abnormal results are displayed) Labs Reviewed  COMPREHENSIVE METABOLIC PANEL WITH GFR - Abnormal; Notable for the following components:      Result Value   Potassium 2.3 (*)    Glucose, Bld 106 (*)    BUN 34 (*)    Creatinine, Ser 2.47 (*)    GFR, Estimated 20 (*)    All other components within normal  limits  CBC WITH DIFFERENTIAL/PLATELET - Abnormal; Notable for the following components:   Platelets 149 (*)    All other components within normal limits  MAGNESIUM  - Abnormal; Notable for the following components:   Magnesium  2.6 (*)    All other components within normal limits  I-STAT CHEM 8, ED - Abnormal; Notable for the following components:   Potassium 2.3 (*)    BUN 36 (*)    Creatinine, Ser 2.60 (*)    Glucose, Bld 105 (*)    Calcium , Ion 1.13 (*)    All other components  within normal limits  I-STAT CHEM 8, ED - Abnormal; Notable for the following components:   Potassium 2.4 (*)    Chloride 97 (*)    BUN 37 (*)    Creatinine, Ser 2.40 (*)    Glucose, Bld 103 (*)    Hemoglobin 15.6 (*)    All other components within normal limits  PHOSPHORUS  TSH    EKG EKG Interpretation Date/Time:  Tuesday Nov 24 2023 10:43:09 EDT Ventricular Rate:  79 PR Interval:  150 QRS Duration:  81 QT Interval:  563 QTC Calculation: 646 R Axis:   35  Text Interpretation: Sinus rhythm Probable LVH with secondary repol abnrm Prolonged QT interval Confirmed by Racheal Buddle [phone]) on 11/24/2023 10:45:12 AM  Radiology DG Chest Port 1 View Result Date: 11/24/2023 CLINICAL DATA:  Weakness EXAM: PORTABLE CHEST 1 VIEW COMPARISON:  10/20/2023, CT 01/13/2022 FINDINGS: Hardware in the cervical spine.mild diffuse increased interstitial opacity likely due to underlying chronic lung disease. No acute airspace disease, pleural effusion or pneumothorax. Cardiomediastinal silhouette within normal limits with aortic atherosclerosis. IMPRESSION: No active disease. Chronic lung disease. Electronically Signed   By: Esmeralda Hedge M.D.   On: 11/24/2023 15:13    Procedures Procedures    Medications Ordered in ED Medications  potassium chloride  10 mEq in 100 mL IVPB ( Intravenous Restarted 11/24/23 1602)  ALPRAZolam  (XANAX ) tablet 0.5 mg (has no administration in time range)  aspirin  EC tablet 81 mg (has no  administration in time range)  clopidogrel  (PLAVIX ) tablet 75 mg (has no administration in time range)  cyanocobalamin  (VITAMIN B12) tablet 1,000 mcg (has no administration in time range)  DULoxetine  (CYMBALTA ) DR capsule 30 mg (has no administration in time range)  levothyroxine  (SYNTHROID ) tablet 25 mcg (has no administration in time range)  midodrine  (PROAMATINE ) tablet 5 mg (has no administration in time range)  pantoprazole  (PROTONIX ) EC tablet 40 mg (has no administration in time range)  prochlorperazine  (COMPAZINE ) injection 10 mg (10 mg Intravenous Given 11/24/23 1600)  rosuvastatin  (CRESTOR ) tablet 40 mg (has no administration in time range)  heparin  injection 5,000 Units (has no administration in time range)  0.9 %  sodium chloride  infusion (has no administration in time range)  acetaminophen  (TYLENOL ) tablet 650 mg (has no administration in time range)    Or  acetaminophen  (TYLENOL ) suppository 650 mg (has no administration in time range)  potassium chloride  SA (KLOR-CON  M) CR tablet 40 mEq (40 mEq Oral Given 11/24/23 1038)  sodium chloride  0.9 % bolus 500 mL (0 mLs Intravenous Stopped 11/24/23 1532)  sodium chloride  0.9 % bolus 500 mL (500 mLs Intravenous New Bag/Given 11/24/23 1602)    ED Course/ Medical Decision Making/ A&P Clinical Course as of 11/24/23 1507  Tue Nov 24, 2023  1448 Discussed with [facility] hospitalist Dr. Michaelene Admire who will evaluate for admission. [MB]    Clinical Course User Index [MB] Tonya Fredrickson, MD                                 Medical Decision Making Amount and/or Complexity of Data Reviewed Labs: ordered. Radiology: ordered.  Risk Prescription drug management. Decision regarding hospitalization.   This patient complains of dizzy lightheaded low blood pressure low potassium dehydrated; this involves an extensive number of treatment Options and is a complaint that carries with it a high risk of complications and morbidity. The differential  includes metabolic derangement, renal insufficiency, ACS, anemia, infection  I ordered, reviewed and interpreted labs, which included CBC normal, chemistries with new low potassium and elevated BUN and creatinine, and magnesium  elevated I ordered medication IV fluids oral and IV potassium and reviewed PMP when indicated. I ordered imaging studies which included chest x-ray and I independently    visualized and interpreted imaging which showed no acute findings Additional history obtained from patient's caregiver Previous records obtained and reviewed recent discharge summary I consulted [facility] hospitalist Dr. Michaelene Admire and discussed lab and imaging findings and discussed disposition.  Cardiac monitoring reviewed, sinus rhythm Social determinants considered, decreased health literacy Critical Interventions: None  After the interventions stated above, I reevaluated the patient and found patient's blood pressure to remain soft although may be her new baseline Admission and further testing considered, she would benefit from continued hydration and correction of her low potassium and AKI.  Care will be needed to not overshoot and put her back into failure again.         Final Clinical Impression(s) / ED Diagnoses Final diagnoses:  AKI (acute kidney injury) (HCC)  Hypokalemia  Dizziness    Rx / DC Orders ED Discharge Orders     None         Tonya Fredrickson, MD 11/24/23 [phone]

## 2023-11-24 NOTE — ED Triage Notes (Signed)
 Pt arrived via POV from [facility] SN[facility] following a PCP appointment. Staff advised the Pt was directed to get treatment at [facility] due to having low K+ levels and being dehydrated on recent blood work.

## 2023-11-24 NOTE — Assessment & Plan Note (Signed)
-  With anxiety -Continue Cymbalta  as needed Xanax  -No suicidal ideation or hallucinations.

## 2023-11-24 NOTE — ED Notes (Signed)
 Pt given ice chips

## 2023-11-25 DIAGNOSIS — E039 Hypothyroidism, unspecified: Secondary | ICD-10-CM | POA: Diagnosis present

## 2023-11-25 DIAGNOSIS — R9089 Other abnormal findings on diagnostic imaging of central nervous system: Secondary | ICD-10-CM | POA: Diagnosis not present

## 2023-11-25 DIAGNOSIS — E876 Hypokalemia: Secondary | ICD-10-CM | POA: Diagnosis present

## 2023-11-25 DIAGNOSIS — I5032 Chronic diastolic (congestive) heart failure: Secondary | ICD-10-CM | POA: Diagnosis present

## 2023-11-25 DIAGNOSIS — R42 Dizziness and giddiness: Secondary | ICD-10-CM | POA: Diagnosis not present

## 2023-11-25 DIAGNOSIS — E782 Mixed hyperlipidemia: Secondary | ICD-10-CM | POA: Diagnosis present

## 2023-11-25 DIAGNOSIS — I951 Orthostatic hypotension: Secondary | ICD-10-CM | POA: Diagnosis present

## 2023-11-25 DIAGNOSIS — F419 Anxiety disorder, unspecified: Secondary | ICD-10-CM | POA: Diagnosis present

## 2023-11-25 DIAGNOSIS — G9389 Other specified disorders of brain: Secondary | ICD-10-CM | POA: Diagnosis not present

## 2023-11-25 DIAGNOSIS — G4730 Sleep apnea, unspecified: Secondary | ICD-10-CM | POA: Diagnosis present

## 2023-11-25 DIAGNOSIS — N179 Acute kidney failure, unspecified: Secondary | ICD-10-CM | POA: Diagnosis present

## 2023-11-25 DIAGNOSIS — M1990 Unspecified osteoarthritis, unspecified site: Secondary | ICD-10-CM | POA: Diagnosis present

## 2023-11-25 DIAGNOSIS — Z96652 Presence of left artificial knee joint: Secondary | ICD-10-CM | POA: Diagnosis present

## 2023-11-25 DIAGNOSIS — N1832 Chronic kidney disease, stage 3b: Secondary | ICD-10-CM | POA: Diagnosis present

## 2023-11-25 DIAGNOSIS — I739 Peripheral vascular disease, unspecified: Secondary | ICD-10-CM | POA: Diagnosis present

## 2023-11-25 DIAGNOSIS — N189 Chronic kidney disease, unspecified: Secondary | ICD-10-CM | POA: Diagnosis not present

## 2023-11-25 DIAGNOSIS — K219 Gastro-esophageal reflux disease without esophagitis: Secondary | ICD-10-CM | POA: Diagnosis present

## 2023-11-25 DIAGNOSIS — M069 Rheumatoid arthritis, unspecified: Secondary | ICD-10-CM | POA: Diagnosis present

## 2023-11-25 DIAGNOSIS — I2510 Atherosclerotic heart disease of native coronary artery without angina pectoris: Secondary | ICD-10-CM | POA: Diagnosis present

## 2023-11-25 DIAGNOSIS — Z7902 Long term (current) use of antithrombotics/antiplatelets: Secondary | ICD-10-CM | POA: Diagnosis not present

## 2023-11-25 DIAGNOSIS — I6782 Cerebral ischemia: Secondary | ICD-10-CM | POA: Diagnosis not present

## 2023-11-25 DIAGNOSIS — I6521 Occlusion and stenosis of right carotid artery: Secondary | ICD-10-CM | POA: Diagnosis present

## 2023-11-25 DIAGNOSIS — H8110 Benign paroxysmal vertigo, unspecified ear: Secondary | ICD-10-CM | POA: Diagnosis present

## 2023-11-25 DIAGNOSIS — M797 Fibromyalgia: Secondary | ICD-10-CM | POA: Diagnosis present

## 2023-11-25 DIAGNOSIS — I130 Hypertensive heart and chronic kidney disease with heart failure and stage 1 through stage 4 chronic kidney disease, or unspecified chronic kidney disease: Secondary | ICD-10-CM | POA: Diagnosis present

## 2023-11-25 DIAGNOSIS — G2581 Restless legs syndrome: Secondary | ICD-10-CM | POA: Diagnosis present

## 2023-11-25 DIAGNOSIS — E860 Dehydration: Secondary | ICD-10-CM | POA: Diagnosis present

## 2023-11-25 DIAGNOSIS — F32A Depression, unspecified: Secondary | ICD-10-CM | POA: Diagnosis present

## 2023-11-25 DIAGNOSIS — I701 Atherosclerosis of renal artery: Secondary | ICD-10-CM | POA: Diagnosis present

## 2023-11-25 LAB — CBC
HCT: 37.7 % (ref 36.0–46.0)
Hemoglobin: 12.5 g/dL (ref 12.0–15.0)
MCH: 30 pg (ref 26.0–34.0)
MCHC: 33.2 g/dL (ref 30.0–36.0)
MCV: 90.6 fL (ref 80.0–100.0)
Platelets: 102 10*3/uL — ABNORMAL LOW (ref 150–400)
RBC: 4.16 MIL/uL (ref 3.87–5.11)
RDW: 13.4 % (ref 11.5–15.5)
WBC: 4.9 10*3/uL (ref 4.0–10.5)
nRBC: 0 % (ref 0.0–0.2)

## 2023-11-25 LAB — COMPREHENSIVE METABOLIC PANEL WITH GFR
ALT: 12 U/L (ref 0–44)
AST: 18 U/L (ref 15–41)
Albumin: 3.4 g/dL — ABNORMAL LOW (ref 3.5–5.0)
Alkaline Phosphatase: 51 U/L (ref 38–126)
Anion gap: 6 (ref 5–15)
BUN: 29 mg/dL — ABNORMAL HIGH (ref 8–23)
CO2: 25 mmol/L (ref 22–32)
Calcium: 8.7 mg/dL — ABNORMAL LOW (ref 8.9–10.3)
Chloride: 111 mmol/L (ref 98–111)
Creatinine, Ser: 2.15 mg/dL — ABNORMAL HIGH (ref 0.44–1.00)
GFR, Estimated: 24 mL/min — ABNORMAL LOW (ref >60)
Glucose, Bld: 89 mg/dL (ref 70–99)
Potassium: 3 mmol/L — ABNORMAL LOW (ref 3.5–5.1)
Sodium: 142 mmol/L (ref 135–145)
Total Bilirubin: 0.4 mg/dL (ref 0.0–1.2)
Total Protein: 6.3 g/dL — ABNORMAL LOW (ref 6.5–8.1)

## 2023-11-25 LAB — VITAMIN B12: Vitamin B-12: 456 pg/mL (ref 180–914)

## 2023-11-25 LAB — FOLATE: Folate: 10.2 ng/mL (ref >5.9)

## 2023-11-25 LAB — MAGNESIUM: Magnesium: 2.8 mg/dL — ABNORMAL HIGH (ref 1.7–2.4)

## 2023-11-25 MED ORDER — POTASSIUM CHLORIDE CRYS ER 20 MEQ PO TBCR
40.0000 meq | EXTENDED_RELEASE_TABLET | Freq: Once | ORAL | Status: AC
  Administered 2023-11-25: 40 meq via ORAL
  Filled 2023-11-25: qty 2

## 2023-11-25 MED ORDER — POTASSIUM CHLORIDE IN NACL 20-0.9 MEQ/L-% IV SOLN: INTRAVENOUS | Status: AC

## 2023-11-25 NOTE — Plan of Care (Signed)

## 2023-11-25 NOTE — Progress Notes (Addendum)
 PROGRESS NOTE  [name] UEA:[id number] DOB: 1951-01-14 DOA: 11/24/2023 PCP: Orlena Bitters, MD  Brief History:  73 year old female with a history of  CAD, carotid artery disease with severe RICA stenosis s/p R-CEA (12//20/2024), IPF, HTN, renal artery stenosis, GERD, fibromyalgia, rheumatoid arthritis, hypothyroidism, CKD stage IIIb, and HLD, presenting secondary to elevated serum creatinine and hypokalemia at the direction of her PCP.  The patient had been complaining of some lightheadedness and dizziness for few days prior to admission.  She denied fevers, chills, chest pain, shortness breath, nausea, vomiting or diarrhea, abdominal pain.  Notably, the patient was recently mated to the hospital on 10/20/2023 to 10/26/23 due to acute on chronic HFpEF.  She was discharged to SNF with torsemide  40 mg daily. In the ED, the patient was afebrile and hemodynamically stable.  Oxygen saturation was 99% on room air.  BMP showed serum creatinine of 2.47 and potassium 2.3.  The patient was started IV fluids and potassium supplementation.  The patient gradually improved.  Physical therapy was consulted and did not recommend any further follow-up.  Her serum creatinine gradually improved and potassium improved with supplementation.   Assessment/Plan: Acute on chronic renal failure--CKD 3b - Baseline creatinine 1.4-1.7 - Presented with serum creatinine 2.47 - Improving with IV fluids - Continue IV fluids - Holding torsemide   Chronic HFpEF - Clinically euvolemic - 10/21/2023 echo EF 65 to 70%, no WMA, normal RVF - Holding torsemide  temporarily  Hypothyroidism - Continue Synthroid  - TSH 1.816  Coronary Artery Disease - No chest pain presently - Continue statin  Peripheral arterial disease -s/p R-CEA 06/2023 -continue ASA, plavix , statin  Orthostatic hypotension - Continue midodrine  -BP checked at 1700 on 5/14--131/40  Mixed hyperlipidemia - Continue statin  Rheumatoid  arthritis - No active flare presently - Currently not on any DMARDs  Depression/anxiety - Continue home dose alprazolam  - PDMP reviewed--alprazolam , #90, last refill 09/23/2023       Family Communication: no  Family at bedside  Consultants:  none  Code Status:  FULL  DVT Prophylaxis:   Heparin    Procedures: As Listed in Progress Note Above  Antibiotics: None       Subjective: Patient is feeling stronger today.  She denies any fevers, chills, chest pain, chest breath, nausea, vomiting, diarrhea, abdominal pain.  Objective: Vitals:   11/25/23 0455 11/25/23 1236 11/25/23 1300 11/25/23 1358  BP: (!) 151/52 (!) 100/48 (!) 105/35 (!) 99/42  Pulse: 66 71  72  Resp: 17 16  16   Temp: 98.4 F (36.9 C) 98.2 F (36.8 C)    TempSrc: Oral Oral    SpO2: 99% 100%    Weight: 59.7 kg     Height:        Intake/Output Summary (Last 24 hours) at 11/25/2023 1716 Last data filed at 11/25/2023 4782 Gross per 24 hour  Intake 1638.23 ml  Output 350 ml  Net 1288.23 ml   Weight change:  Exam:  General:  Pt is alert, follows commands appropriately, not in acute distress HEENT: No icterus, No thrush, No neck mass, [place]/AT Cardiovascular: RRR, S1/S2, no rubs, no gallops Respiratory: CTA bilaterally, no wheezing, no crackles, no rhonchi Abdomen: Soft/+BS, non tender, non distended, no guarding Extremities: No edema, No lymphangitis, No petechiae, No rashes, no synovitis   Data Reviewed: I have personally reviewed following labs and imaging studies Basic Metabolic Panel: Recent Labs  Lab 11/24/23 1004 11/24/23 1012 11/24/23 1028 11/25/23 0441  NA  141 141 141 142  K 2.3* 2.3* 2.4* 3.0*  CL 99 99 97* 111  CO2 31  --   --  25  GLUCOSE 106* 105* 103* 89  BUN 34* 36* 37* 29*  CREATININE 2.47* 2.60* 2.40* 2.15*  CALCIUM  9.9  --   --  8.7*  MG 2.6*  --   --  2.8*  PHOS 3.8  --   --   --    Liver Function Tests: Recent Labs  Lab 11/24/23 1004 11/25/23 0441  AST 23 18   ALT 14 12  ALKPHOS 65 51  BILITOT 0.4 0.4  PROT 7.7 6.3*  ALBUMIN  4.2 3.4*   No results for input(s): "LIPASE", "AMYLASE" in the last 168 hours. No results for input(s): "AMMONIA" in the last 168 hours. Coagulation Profile: No results for input(s): "INR", "PROTIME" in the last 168 hours. CBC: Recent Labs  Lab 11/24/23 1004 11/24/23 1012 11/24/23 1028 11/25/23 0441  WBC 7.1  --   --  4.9  NEUTROABS 4.3  --   --   --   HGB 14.4 14.3 15.6* 12.5  HCT 41.1 42.0 46.0 37.7  MCV 87.8  --   --  90.6  PLT 149*  --   --  102*   Cardiac Enzymes: No results for input(s): "CKTOTAL", "CKMB", "CKMBINDEX", "TROPONINI" in the last 168 hours. BNP: Invalid input(s): "POCBNP" CBG: No results for input(s): "GLUCAP" in the last 168 hours. HbA1C: No results for input(s): "HGBA1C" in the last 72 hours. Urine analysis:    Component Value Date/Time   COLORURINE YELLOW 07/02/2023 1007   APPEARANCEUR CLEAR 07/02/2023 1007   APPEARANCEUR Clear 10/14/2021 1419   LABSPEC 1.020 07/02/2023 1007   PHURINE 5.0 07/02/2023 1007   GLUCOSEU NEGATIVE 07/02/2023 1007   HGBUR NEGATIVE 07/02/2023 1007   BILIRUBINUR NEGATIVE 07/02/2023 1007   BILIRUBINUR Negative 10/14/2021 1419   KETONESUR NEGATIVE 07/02/2023 1007   PROTEINUR 100 (A) 07/02/2023 1007   UROBILINOGEN 0.2 02/06/2014 1816   NITRITE NEGATIVE 07/02/2023 1007   LEUKOCYTESUR NEGATIVE 07/02/2023 1007   Sepsis Labs: @LABRCNTIP (procalcitonin:4,lacticidven:4) )No results found for this or any previous visit (from the past 240 hours).   Scheduled Meds:  aspirin  EC  81 mg Oral Daily   clopidogrel   75 mg Oral Daily   cyanocobalamin   1,000 mcg Oral Daily   heparin   5,000 Units Subcutaneous Q8H   levothyroxine   25 mcg Oral QAC breakfast   midodrine   5 mg Oral TID WC   pantoprazole   40 mg Oral Daily   rosuvastatin   40 mg Oral Daily   Continuous Infusions:  Procedures/Studies: DG Chest Port 1 View Result Date: 11/24/2023 CLINICAL DATA:   Weakness EXAM: PORTABLE CHEST 1 VIEW COMPARISON:  10/20/2023, CT 01/13/2022 FINDINGS: Hardware in the cervical spine.mild diffuse increased interstitial opacity likely due to underlying chronic lung disease. No acute airspace disease, pleural effusion or pneumothorax. Cardiomediastinal silhouette within normal limits with aortic atherosclerosis. IMPRESSION: No active disease. Chronic lung disease. Electronically Signed   By: Esmeralda Hedge M.D.   On: 11/24/2023 15:13    Demaris Fillers, DO  [facility]  If 7PM-7AM, please contact night-coverage [email or web address] Password [facility] 11/25/2023, 5:16 PM   LOS: 0 days

## 2023-11-25 NOTE — Hospital Course (Addendum)
 73 year old female with a history of  CAD, carotid artery disease with severe RICA stenosis s/p R-CEA (12//20/2024), IPF, HTN, renal artery stenosis, GERD, fibromyalgia, rheumatoid arthritis, hypothyroidism, CKD stage IIIb, and HLD, presenting secondary to elevated serum creatinine and hypokalemia at the direction of her PCP.  The patient had been complaining of some lightheadedness and dizziness for few days prior to admission.  She denied fevers, chills, chest pain, shortness breath, nausea, vomiting or diarrhea, abdominal pain.  Notably, the patient was recently mated to the hospital on 10/20/2023 to 10/26/23 due to acute on chronic HFpEF.  She was discharged to SNF with torsemide  40 mg daily. In the ED, the patient was afebrile and hemodynamically stable.  Oxygen saturation was 99% on room air.  BMP showed serum creatinine of 2.47 and potassium 2.3.  The patient was started IV fluids and potassium supplementation.  The patient gradually improved.  Physical therapy was consulted and did not recommend any further follow-up.  Her serum creatinine gradually improved and potassium improved with supplementation.  On 11/26/23, patient developed worsening dizziness again.  She revealed she has had intermittently dizziness for at least one month since her last d/c from hospital in April 2025.  She denied any focal extremity weakness or dysesthesia, but complain of some visual disturbance--"can't focus".  Therefore, MRI brain was ordered.

## 2023-11-25 NOTE — Evaluation (Signed)
 Physical Therapy Evaluation Patient Details Name: [name] MRN: [id number] DOB: 05-Jul-1951 Today's Date: 11/25/2023  History of Present Illness  [name] is a 73 y.o. female with medical history significant of orthostatic hypotension, HLD,CKD stage 3b, GERD, hypothyroidism, depression/anxiety and CAD; who presented to the hospital secondary to abnormal labs.      Patient was seen by her PCP and had labs done demonstrating abnormal Cr levels from baseline and she also mentioned feeling lightheaded and dizzy.      Work up demonstrating significant hypokalemia.     No dysuria, hematuria, fever, CP, SOB, nausea, vomiting, focal weakness or any other complaints. Positive orthostasis appreciated.  Patient received IVF's and electrolytes repletion started. TRH contacted to place patient in the hospital for further eval and management.   Clinical Impression  Patient functioning at baseline for functional mobility and gait other than c/o mild dizziness upon sitting up at bedside that resolved after 2-3 minutes, then demonstrated good return for ambulating in room, hallway using SPC with mostly step-through pattern without loss of balance. Patient encouraged to ambulate ad lib in room and with nursing staff for length of stay. Plan:  Patient discharged from physical therapy to care of nursing for ambulation daily as tolerated for length of stay.          If plan is discharge home, recommend the following: Help with stairs or ramp for entrance;Assist for transportation   Can travel by private vehicle        Equipment Recommendations None recommended by PT  Recommendations for Other Services       Functional Status Assessment Patient has not had a recent decline in their functional status     Precautions / Restrictions Precautions Precautions: None Restrictions Weight Bearing Restrictions Per Provider Order: No      Mobility  Bed Mobility Overal bed mobility: Modified  Independent                  Transfers Overall transfer level: Modified independent                      Ambulation/Gait Ambulation/Gait assistance: Modified independent (Device/Increase time) Gait Distance (Feet): 100 Feet Assistive device: Straight cane Gait Pattern/deviations: WFL(Within Functional Limits), Step-through pattern Gait velocity: slightly decreased     General Gait Details: grossly WFL with good return for ambulating in room, hallway using SPC with mostly step-through pattern without loss of blaance  Stairs            Wheelchair Mobility     Tilt Bed    Modified Rankin (Stroke Patients Only)       Balance Overall balance assessment: No apparent balance deficits (not formally assessed)                                           Pertinent Vitals/Pain Pain Assessment Pain Assessment: No/denies pain    Home Living Family/patient expects to be discharged to:: Assisted living                 Home Equipment: Agricultural consultant (2 wheels);Wheelchair - manual;Cane - single point      Prior Function Prior Level of Function : Needs assist       Physical Assist : Mobility (physical);ADLs (physical) Mobility (physical): Bed mobility;Transfers;Gait;Stairs   Mobility Comments: household ambulation using SPC ADLs Comments: Indpendent with household  ADLs     Extremity/Trunk Assessment   Upper Extremity Assessment Upper Extremity Assessment: Overall WFL for tasks assessed    Lower Extremity Assessment Lower Extremity Assessment: Overall WFL for tasks assessed    Cervical / Trunk Assessment Cervical / Trunk Assessment: Normal  Communication   Communication Communication: No apparent difficulties    Cognition Arousal: Alert Behavior During Therapy: WFL for tasks assessed/performed   PT - Cognitive impairments: No apparent impairments                                 Cueing Cueing  Techniques: Verbal cues     General Comments      Exercises     Assessment/Plan    PT Assessment Patient does not need any further PT services  PT Problem List         PT Treatment Interventions      PT Goals (Current goals can be found in the Care Plan section)  Acute Rehab PT Goals Patient Stated Goal: return home with ALF staff to assist PT Goal Formulation: With patient Time For Goal Achievement: 11/25/23 Potential to Achieve Goals: Good    Frequency       Co-evaluation               AM-PAC PT "6 Clicks" Mobility  Outcome Measure Help needed turning from your back to your side while in a flat bed without using bedrails?: None Help needed moving from lying on your back to sitting on the side of a flat bed without using bedrails?: None   Help needed standing up from a chair using your arms (e.g., wheelchair or bedside chair)?: None Help needed to walk in hospital room?: None Help needed climbing 3-5 steps with a railing? : A Little 6 Click Score: 19    End of Session   Activity Tolerance: Patient tolerated treatment well Patient left: in chair;with call bell/phone within reach Nurse Communication: Mobility status PT Visit Diagnosis: Unsteadiness on feet (R26.81);Other abnormalities of gait and mobility (R26.89);Muscle weakness (generalized) (M62.81)    Time: 9562-1308 PT Time Calculation (min) (ACUTE ONLY): 10 min   Charges:   PT Evaluation $PT Eval Low Complexity: 1 Low PT Treatments $Therapeutic Activity: 8-22 mins PT General Charges $$ ACUTE PT VISIT: 1 Visit         1:08 PM, 11/25/23 Walton Guppy, MPT Physical Therapist with [facility] [phone] office [phone] mobile phone

## 2023-11-26 ENCOUNTER — Inpatient Hospital Stay (HOSPITAL_COMMUNITY)

## 2023-11-26 ENCOUNTER — Encounter (HOSPITAL_COMMUNITY): Payer: Self-pay | Admitting: Internal Medicine

## 2023-11-26 DIAGNOSIS — N189 Chronic kidney disease, unspecified: Secondary | ICD-10-CM | POA: Diagnosis not present

## 2023-11-26 DIAGNOSIS — I5032 Chronic diastolic (congestive) heart failure: Secondary | ICD-10-CM | POA: Diagnosis not present

## 2023-11-26 DIAGNOSIS — N179 Acute kidney failure, unspecified: Secondary | ICD-10-CM | POA: Diagnosis not present

## 2023-11-26 DIAGNOSIS — N1832 Chronic kidney disease, stage 3b: Secondary | ICD-10-CM | POA: Diagnosis not present

## 2023-11-26 LAB — BASIC METABOLIC PANEL WITH GFR
Anion gap: 7 (ref 5–15)
BUN: 25 mg/dL — ABNORMAL HIGH (ref 8–23)
CO2: 23 mmol/L (ref 22–32)
Calcium: 8.7 mg/dL — ABNORMAL LOW (ref 8.9–10.3)
Chloride: 112 mmol/L — ABNORMAL HIGH (ref 98–111)
Creatinine, Ser: 2.01 mg/dL — ABNORMAL HIGH (ref 0.44–1.00)
GFR, Estimated: 26 mL/min — ABNORMAL LOW (ref >60)
Glucose, Bld: 97 mg/dL (ref 70–99)
Potassium: 3.8 mmol/L (ref 3.5–5.1)
Sodium: 142 mmol/L (ref 135–145)

## 2023-11-26 LAB — MAGNESIUM: Magnesium: 2.6 mg/dL — ABNORMAL HIGH (ref 1.7–2.4)

## 2023-11-26 MED ORDER — ACETAMINOPHEN 500 MG PO TABS
1000.0000 mg | ORAL_TABLET | Freq: Three times a day (TID) | ORAL | Status: DC
  Administered 2023-11-26 – 2023-11-27 (×2): 1000 mg via ORAL
  Filled 2023-11-26 (×2): qty 2

## 2023-11-26 MED ORDER — POTASSIUM CHLORIDE IN NACL 20-0.9 MEQ/L-% IV SOLN: INTRAVENOUS | Status: DC

## 2023-11-26 NOTE — Progress Notes (Signed)
 PROGRESS NOTE  [name] JXB:[id number] DOB: April 18, 1951 DOA: 11/24/2023 PCP: Orlena Bitters, MD  Brief History:  73 year old female with a history of  CAD, carotid artery disease with severe RICA stenosis s/p R-CEA (12//20/2024), IPF, HTN, renal artery stenosis, GERD, fibromyalgia, rheumatoid arthritis, hypothyroidism, CKD stage IIIb, and HLD, presenting secondary to elevated serum creatinine and hypokalemia at the direction of her PCP.  The patient had been complaining of some lightheadedness and dizziness for few days prior to admission.  She denied fevers, chills, chest pain, shortness breath, nausea, vomiting or diarrhea, abdominal pain.  Notably, the patient was recently mated to the hospital on 10/20/2023 to 10/26/23 due to acute on chronic HFpEF.  She was discharged to SNF with torsemide  40 mg daily. In the ED, the patient was afebrile and hemodynamically stable.  Oxygen saturation was 99% on room air.  BMP showed serum creatinine of 2.47 and potassium 2.3.  The patient was started IV fluids and potassium supplementation.  The patient gradually improved.  Physical therapy was consulted and did not recommend any further follow-up.  Her serum creatinine gradually improved and potassium improved with supplementation.  On 11/26/23, patient developed worsening dizziness again.  She revealed she has had intermittently dizziness for at least one month since her last d/c from hospital in April 2025.  She denied any focal extremity weakness or dysesthesia, but complain of some visual disturbance--"can't focus".  Therefore, MRI brain was ordered.     Assessment/Plan: Acute on chronic renal failure--CKD 3b - Baseline creatinine 1.4-1.7 - Presented with serum creatinine 2.47 - Improving with IV fluids - Continue IV fluids - Holding torsemide   Dizziness/vertigo -multifactorial including orthostatic hypotension, volume depletion, BPPV and possible stroke -5/15--orthostatics positive -MR  brain -restart IVF   Chronic HFpEF - Clinically euvolemic - 10/21/2023 echo EF 65 to 70%, no WMA, normal RVF - Holding torsemide  temporarily   Hypothyroidism - Continue Synthroid  - TSH 1.816   Coronary Artery Disease - No chest pain presently - Continue statin   Peripheral arterial disease -s/p R-CEA 06/2023 -continue ASA, plavix , statin   Orthostatic hypotension - Continue midodrine  -11/26/23--remains orthostatic -increase midodrine  -restart IVF   Mixed hyperlipidemia - Continue statin   Rheumatoid arthritis - No active flare presently - Currently not on any DMARDs   Depression/anxiety - Continue home dose alprazolam  - PDMP reviewed--alprazolam , #90, last refill 09/23/2023             Family Communication: no  Family at bedside   Consultants:  none   Code Status:  FULL   DVT Prophylaxis:  [place] Heparin      Procedures: As Listed in Progress Note Above   Antibiotics: None  Subjective:  Pt complains of dizziness. Denies cp, sob, n/v/d, abd pain, f/c Objective: Vitals:   11/26/23 0500 11/26/23 0525 11/26/23 1335 11/26/23 1504  BP:  (!) 128/45 (!) 123/42 (!) 115/37  Pulse:  68 73 74  Resp:  18 18   Temp:  98.1 F (36.7 C) 98.3 F (36.8 C)   TempSrc:      SpO2:  96% 97% 98%  Weight: 60.1 kg     Height:       No intake or output data in the 24 hours ending 11/26/23 1741  Weight change: -1.9 kg Exam:  General:  Pt is alert, follows commands appropriately, not in acute distress HEENT: No icterus, No thrush, No neck mass, [place]/AT Cardiovascular: RRR, S1/S2, no rubs, no gallops  Respiratory: CTA bilaterally, no wheezing, no crackles, no rhonchi Abdomen: Soft/+BS, non tender, non distended, no guarding Extremities: No edema, No lymphangitis, No petechiae, No rashes, no synovitis Neuro:  CN II-XII intact, strength 4/5 in RUE, RLE, strength 4/5 LUE, LLE; sensation intact bilateral; no dysmetria; babinski equivocal    Data Reviewed: I have personally  reviewed following labs and imaging studies Basic Metabolic Panel: Recent Labs  Lab 11/24/23 1004 11/24/23 1012 11/24/23 1028 11/25/23 0441 11/26/23 0422  NA 141 141 141 142 142  K 2.3* 2.3* 2.4* 3.0* 3.8  CL 99 99 97* 111 112*  CO2 31  --   --  25 23  GLUCOSE 106* 105* 103* 89 97  BUN 34* 36* 37* 29* 25*  CREATININE 2.47* 2.60* 2.40* 2.15* 2.01*  CALCIUM  9.9  --   --  8.7* 8.7*  MG 2.6*  --   --  2.8* 2.6*  PHOS 3.8  --   --   --   --    Liver Function Tests: Recent Labs  Lab 11/24/23 1004 11/25/23 0441  AST 23 18  ALT 14 12  ALKPHOS 65 51  BILITOT 0.4 0.4  PROT 7.7 6.3*  ALBUMIN  4.2 3.4*   No results for input(s): "LIPASE", "AMYLASE" in the last 168 hours. No results for input(s): "AMMONIA" in the last 168 hours. Coagulation Profile: No results for input(s): "INR", "PROTIME" in the last 168 hours. CBC: Recent Labs  Lab 11/24/23 1004 11/24/23 1012 11/24/23 1028 11/25/23 0441  WBC 7.1  --   --  4.9  NEUTROABS 4.3  --   --   --   HGB 14.4 14.3 15.6* 12.5  HCT 41.1 42.0 46.0 37.7  MCV 87.8  --   --  90.6  PLT 149*  --   --  102*   Cardiac Enzymes: No results for input(s): "CKTOTAL", "CKMB", "CKMBINDEX", "TROPONINI" in the last 168 hours. BNP: Invalid input(s): "POCBNP" CBG: No results for input(s): "GLUCAP" in the last 168 hours. HbA1C: No results for input(s): "HGBA1C" in the last 72 hours. Urine analysis:    Component Value Date/Time   COLORURINE YELLOW 07/02/2023 1007   APPEARANCEUR CLEAR 07/02/2023 1007   APPEARANCEUR Clear 10/14/2021 1419   LABSPEC 1.020 07/02/2023 1007   PHURINE 5.0 07/02/2023 1007   GLUCOSEU NEGATIVE 07/02/2023 1007   HGBUR NEGATIVE 07/02/2023 1007   BILIRUBINUR NEGATIVE 07/02/2023 1007   BILIRUBINUR Negative 10/14/2021 1419   KETONESUR NEGATIVE 07/02/2023 1007   PROTEINUR 100 (A) 07/02/2023 1007   UROBILINOGEN 0.2 02/06/2014 1816   NITRITE NEGATIVE 07/02/2023 1007   LEUKOCYTESUR NEGATIVE 07/02/2023 1007   Sepsis  Labs: @LABRCNTIP (procalcitonin:4,lacticidven:4) )No results found for this or any previous visit (from the past 240 hours).   Scheduled Meds:  acetaminophen   1,000 mg Oral Q8H   aspirin  EC  81 mg Oral Daily   clopidogrel   75 mg Oral Daily   cyanocobalamin   1,000 mcg Oral Daily   heparin   5,000 Units Subcutaneous Q8H   levothyroxine   25 mcg Oral QAC breakfast   midodrine   5 mg Oral TID WC   pantoprazole   40 mg Oral Daily   rosuvastatin   40 mg Oral Daily   Continuous Infusions:  0.9 % NaCl with KCl 20 mEq / L 50 mL/hr at 11/26/23 1512    Procedures/Studies: DG Chest Port 1 View Result Date: 11/24/2023 CLINICAL DATA:  Weakness EXAM: PORTABLE CHEST 1 VIEW COMPARISON:  10/20/2023, CT 01/13/2022 FINDINGS: Hardware in the cervical spine.mild diffuse increased interstitial opacity  likely due to underlying chronic lung disease. No acute airspace disease, pleural effusion or pneumothorax. Cardiomediastinal silhouette within normal limits with aortic atherosclerosis. IMPRESSION: No active disease. Chronic lung disease. Electronically Signed   By: Esmeralda Hedge M.D.   On: 11/24/2023 15:13    Demaris Fillers, DO  [facility]  If 7PM-7AM, please contact night-coverage [email or web address] Password [email or web address] 11/26/2023, 5:41 PM   LOS: 1 day

## 2023-11-26 NOTE — Progress Notes (Signed)
 Mobility Specialist Progress Note:    11/26/23 1504  Therapy Vitals  Pulse Rate 74  BP (!) 115/37  Patient Position (if appropriate) Sitting  Oxygen Therapy  SpO2 98 %  O2 Device Room Air  Mobility  Activity Ambulated with assistance in hallway  Level of Assistance Modified independent, requires aide device or extra time  Assistive Device [employer] Ambulated (ft) 100 ft  Range of Motion/Exercises Active;All extremities  Activity Response Tolerated well  Mobility Referral Yes  Mobility visit 1 Mobility  Mobility Specialist Start Time (ACUTE ONLY) 1440  Mobility Specialist Stop Time (ACUTE ONLY) 1500  Mobility Specialist Time Calculation (min) (ACUTE ONLY) 20 min   Pt received sitting EOB, eager for mobility. ModI to stand and ambulate with cane. Tolerated well, pt c/o SOB and dizziness near EOS. BP 115/37, MAP of 58. Left pt sitting EOB, notified RN. All needs met.  Glinda Lapping Mobility Specialist Please contact via Special educational needs teacher or  Rehab office at [phone]

## 2023-11-26 NOTE — Plan of Care (Signed)
  Problem: Health Behavior/Discharge Planning: Goal: Ability to manage health-related needs will improve Outcome: Progressing   Problem: Clinical Measurements: Goal: Ability to maintain clinical measurements within normal limits will improve Outcome: Progressing Goal: Will remain free from infection Outcome: Progressing Goal: Diagnostic test results will improve Outcome: Progressing   Problem: Activity: Goal: Risk for activity intolerance will decrease Outcome: Progressing   Problem: Nutrition: Goal: Adequate nutrition will be maintained Outcome: Progressing   Problem: Coping: Goal: Level of anxiety will decrease Outcome: Progressing   Problem: Elimination: Goal: Will not experience complications related to bowel motility Outcome: Progressing Goal: Will not experience complications related to urinary retention Outcome: Progressing   Problem: Pain Managment: Goal: General experience of comfort will improve and/or be controlled Outcome: Progressing   Problem: Safety: Goal: Ability to remain free from injury will improve Outcome: Progressing   Problem: Skin Integrity: Goal: Risk for impaired skin integrity will decrease Outcome: Progressing

## 2023-11-27 DIAGNOSIS — N1832 Chronic kidney disease, stage 3b: Secondary | ICD-10-CM | POA: Diagnosis not present

## 2023-11-27 DIAGNOSIS — I5032 Chronic diastolic (congestive) heart failure: Secondary | ICD-10-CM | POA: Diagnosis not present

## 2023-11-27 DIAGNOSIS — N179 Acute kidney failure, unspecified: Secondary | ICD-10-CM | POA: Diagnosis not present

## 2023-11-27 LAB — BASIC METABOLIC PANEL WITH GFR
Anion gap: 5 (ref 5–15)
BUN: 28 mg/dL — ABNORMAL HIGH (ref 8–23)
CO2: 24 mmol/L (ref 22–32)
Calcium: 8.9 mg/dL (ref 8.9–10.3)
Chloride: 110 mmol/L (ref 98–111)
Creatinine, Ser: 1.84 mg/dL — ABNORMAL HIGH (ref 0.44–1.00)
GFR, Estimated: 28 mL/min — ABNORMAL LOW (ref >60)
Glucose, Bld: 105 mg/dL — ABNORMAL HIGH (ref 70–99)
Potassium: 4 mmol/L (ref 3.5–5.1)
Sodium: 139 mmol/L (ref 135–145)

## 2023-11-27 LAB — MAGNESIUM: Magnesium: 2.5 mg/dL — ABNORMAL HIGH (ref 1.7–2.4)

## 2023-11-27 MED ORDER — TORSEMIDE 20 MG PO TABS: 20.0000 mg | ORAL_TABLET | Freq: Every day | ORAL | 1 refills | Status: DC

## 2023-11-27 MED ORDER — MIDODRINE HCL 5 MG PO TABS: 5.0000 mg | ORAL_TABLET | Freq: Three times a day (TID) | ORAL | 1 refills | Status: DC

## 2023-11-27 NOTE — Discharge Summary (Signed)
 Physician Discharge Summary   Patient: [name] MRN: [id number] DOB: August 22, 1950  Admit date:     11/24/2023  Discharge date: 11/27/23  Discharge Physician: Myrtie Atkinson Saivion Goettel   PCP: Orlena Bitters, MD   Recommendations at discharge:   Please follow up with primary care provider within 1-2 weeks  Please repeat BMP and CBC in one week      Hospital Course: 73 year old female with a history of  CAD, carotid artery disease with severe RICA stenosis s/p R-CEA (12//20/2024), IPF, HTN, renal artery stenosis, GERD, fibromyalgia, rheumatoid arthritis, hypothyroidism, CKD stage IIIb, and HLD, presenting secondary to elevated serum creatinine and hypokalemia at the direction of her PCP.  The patient had been complaining of some lightheadedness and dizziness for few days prior to admission.  She denied fevers, chills, chest pain, shortness breath, nausea, vomiting or diarrhea, abdominal pain.  Notably, the patient was recently mated to the hospital on 10/20/2023 to 10/26/23 due to acute on chronic HFpEF.  She was discharged to SNF with torsemide  40 mg daily. In the ED, the patient was afebrile and hemodynamically stable.  Oxygen saturation was 99% on room air.  BMP showed serum creatinine of 2.47 and potassium 2.3.  The patient was started IV fluids and potassium supplementation.  The patient gradually improved.  Physical therapy was consulted and did not recommend any further follow-up.  Her serum creatinine gradually improved and potassium improved with supplementation.  On 11/26/23, patient developed worsening dizziness again.  She revealed she has had intermittently dizziness for at least one month since her last d/c from hospital in April 2025.  She denied any focal extremity weakness or dysesthesia, but complain of some visual disturbance--"can't focus".  Therefore, MRI brain was ordered.    Restarted judicious IVF.  MRI negative.  On 11/27/23, patient was symptomatically better.  She ambulated 300 feet  without difficulty.  Her repeat orthostatics were negative  Assessment and Plan: Acute on chronic renal failure--CKD 3b - Baseline creatinine 1.4-1.7 - Presented with serum creatinine 2.47 - Improving with IV fluids - Continue IV fluids - Holding torsemide    Dizziness/vertigo -multifactorial including orthostatic hypotension, volume depletion, BPPV and possible stroke -5/15--orthostatics positive -MR brain-neg for acute findings -restarted IVF -5/16--symptomatically better,  repeat orthostatics neg -use meclizine prn   Chronic HFpEF - Clinically euvolemic - 10/21/2023 echo EF 65 to 70%, no WMA, normal RVF - Holding torsemide  temporarily -restart torsemide  at lower dose 20 mg daily (previously 40 mg daily)--restart on 11/28/23   Hypothyroidism - Continue Synthroid  - TSH 1.816   Coronary Artery Disease - No chest pain presently - Continue statin   Peripheral arterial disease -s/p R-CEA 06/2023 -continue ASA, plavix , statin   Orthostatic hypotension - Continue midodrine  -11/26/23--remains orthostatic -restart IVF -5/16--symptomatically better,  repeat orthostatics neg -use meclizine prn   Mixed hyperlipidemia - Continue statin   Rheumatoid arthritis - No active flare presently - Currently not on any DMARDs   Depression/anxiety - Continue home dose alprazolam  - PDMP reviewed--alprazolam , #90, last refill 09/23/2023     Consultants: none Procedures performed: none  Disposition: Group home Diet recommendation:  Cardiac diet DISCHARGE MEDICATION: Allergies as of 11/27/2023       Reactions   Celecoxib Itching   Citalopram Other (See Comments), Palpitations   Heart rate increased   Ciprofloxacin Hcl Other (See Comments)   Mya have made her sick or bad diarrhea   Dicyclomine  Diarrhea, Other (See Comments)   Made diarrhea worse   Lamotrigine Diarrhea  Metoclopramide Other (See Comments)   Dizziness   Milnacipran Hcl Other (See Comments)   Unknown    Omeprazole Other (See Comments)   Pregabalin Other (See Comments)   Serotonin Reuptake Inhibitors (ssris) Other (See Comments)   Simvastatin  Other (See Comments)   Increased back and muscle pain   Tape Other (See Comments)   Skin gets red and skin pulls off, paper tape only   Tizanidine Other (See Comments)   Unknown    Trazodone  And Nefazodone Other (See Comments)   Hallucinations    Venlafaxine Other (See Comments)   Affected eyes, blurred vision   Codeine Itching   Nefazodone Other (See Comments)   Sertraline  Hcl Other (See Comments)   Stomach upset. Pt takes this med at home 11/2022        Medication List     STOP taking these medications    amoxicillin 500 MG capsule Commonly known as: AMOXIL       TAKE these medications    acetaminophen  325 MG tablet Commonly known as: TYLENOL  Take 325 mg by mouth every 6 (six) hours as needed for mild pain (pain score 1-3).   albuterol  108 (90 Base) MCG/ACT inhaler Commonly known as: VENTOLIN  HFA Inhale 1 puff into the lungs 4 (four) times daily as needed for wheezing or shortness of breath.   aspirin  EC 81 MG tablet Take 81 mg by mouth daily.   clopidogrel  75 MG tablet Commonly known as: PLAVIX  Take 1 tablet (75 mg total) by mouth daily.   cyanocobalamin  1000 MCG tablet Commonly known as: VITAMIN B12 Take 1 tablet (1,000 mcg total) by mouth daily.   dapagliflozin  propanediol 10 MG Tabs tablet Commonly known as: FARXIGA  Take 1 tablet (10 mg total) by mouth daily.   EPINEPHrine 0.3 mg/0.3 mL Soaj injection Commonly known as: EPI-PEN Inject 0.3 mg into the muscle as needed for anaphylaxis.   levothyroxine  25 MCG tablet Commonly known as: SYNTHROID  Take 25 mcg by mouth daily before breakfast.   meclizine 12.5 MG tablet Commonly known as: ANTIVERT Take 12.5 mg by mouth 2 (two) times daily.   methocarbamol 500 MG tablet Commonly known as: ROBAXIN Take 1,000 mg by mouth every 6 (six) hours as needed (pain).    midodrine  5 MG tablet Commonly known as: PROAMATINE  Take 1 tablet (5 mg total) by mouth 3 (three) times daily with meals. What changed: when to take this   nitroGLYCERIN  0.4 MG SL tablet Commonly known as: NITROSTAT  Place 1 tablet (0.4 mg total) under the tongue every 5 (five) minutes x 3 doses as needed for chest pain (if no relief after 2nd dose, proceed to ED or call 911).   ondansetron  4 MG tablet Commonly known as: ZOFRAN  Take 4 mg by mouth every 8 (eight) hours as needed for nausea or vomiting.   Oyster Shell Calcium  500 MG Tabs Take 1 tablet by mouth 2 (two) times daily.   pantoprazole  40 MG tablet Commonly known as: PROTONIX  Take 40 mg by mouth daily.   rosuvastatin  40 MG tablet Commonly known as: CRESTOR  Take 1 tablet (40 mg total) by mouth daily.   torsemide  20 MG tablet Commonly known as: DEMADEX  Take 1 tablet (20 mg total) by mouth daily. Start taking on: Nov 28, 2023 What changed: how much to take   Vitamin D  (Ergocalciferol ) 1.25 MG (50000 UNIT) Caps capsule Commonly known as: DRISDOL  Take 50,000 Units by mouth every Monday.        Discharge Exam: [employer]  11/25/23 0455 11/26/23 0500 11/27/23 0443  Weight: 59.7 kg 60.1 kg 60.9 kg   HEENT:  /AT, No thrush, no icterus CV:  RRR, no rub, no S3, no S4 Lung:  CTA, no wheeze, no rhonchi Abd:  soft/+BS, NT Ext:  No edema, no lymphangitis, no synovitis, no rash Neuro:  CN II-XII intact, strength 4/5 in RUE, RLE, strength 4/5 LUE, LLE; sensation intact bilateral; no dysmetria; babinski equivocal   Condition at discharge: stable  The results of significant diagnostics from this hospitalization (including imaging, microbiology, ancillary and laboratory) are listed below for reference.   Imaging Studies: MR BRAIN WO CONTRAST Result Date: 11/26/2023 CLINICAL DATA:  Acute dizziness, visual disturbance. EXAM: MRI HEAD WITHOUT CONTRAST MRA HEAD WITHOUT CONTRAST TECHNIQUE: Multiplanar, multi-echo pulse  sequences of the brain and surrounding structures were acquired without intravenous contrast. Angiographic images of the Circle of Willis were acquired using MRA technique without intravenous contrast. COMPARISON:  CT head 03/11/2020.  MRI head 08/03/2023. FINDINGS: MRI HEAD FINDINGS Brain: No evidence of acute infarct. 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 No evidence of intracranial hemorrhage. Scattered and confluent T2/FLAIR hyperintensity in the periventricular and subcortical white matter. Mild parenchymal volume loss. Basilar cisterns are patent. No extra-axial fluid collections. Vascular: Normal flow voids. Skull and upper cervical spine: Anterior cervical fusion in the the upper cervical spine, partially visualized. There is fusion of the C4 and C5 vertebra noted. The visualized calvarium is unremarkable. Sinuses/Orbits: Bilateral lens replacement.  Orbits are symmetric. Other: Mastoid air cells are clear. MRA HEAD FINDINGS Anterior circulation: The intracranial internal carotid arteries are patent. The right M1 segment and right MCA bifurcation is patent. For right MCA branches are patent. Focal severe stenosis of an M3 segment of the right MCA. The left M1 segment and left MCA bifurcation are patent. Left MCA branches are patent. The A1 and A2 segments are patent bilaterally. There is mild narrowing of the left A2 segment. Distal ACA branches are patent. Posterior circulation: Visualized intracranial vertebral arteries are patent. The basilar artery is patent. The posterior cerebral arteries are patent proximally. Distal PCA branches not well visualized. The posterior communicating arteries are visualized bilaterally. Superior cerebellar arteries visualized bilaterally. Anatomic variants: None significant. IMPRESSION: No acute intracranial abnormality. Focus of encephalomalacia in  the left occipital lobe suggestive of remote infarct, new since January. Moderate chronic microvascular ischemic changes. Generalized parenchymal volume loss. No evidence of proximal occlusion on MRA. Distal PCA branches are not well evaluated. Consider CTA for further evaluation. Focal severe stenosis of an M3 branch of the right MCA. Electronically Signed   By: Denny Flack M.D.   On: 11/26/2023 22:21   MR ANGIO HEAD WO CONTRAST Result Date: 11/26/2023 CLINICAL DATA:  Acute dizziness, visual disturbance. EXAM: MRI HEAD WITHOUT CONTRAST MRA HEAD WITHOUT CONTRAST TECHNIQUE: Multiplanar, multi-echo pulse sequences of the brain and surrounding structures were acquired without intravenous contrast. Angiographic images of the Circle of Willis were acquired using MRA technique without intravenous contrast. COMPARISON:  CT head 03/11/2020.  MRI head 08/03/2023. FINDINGS: MRI HEAD FINDINGS Brain: No evidence of acute infarct. 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 No evidence of intracranial hemorrhage. Scattered and confluent T2/FLAIR hyperintensity in the periventricular and subcortical white matter. Mild parenchymal volume loss. Basilar cisterns are patent. No extra-axial fluid collections. Vascular:  Normal flow voids. Skull and upper cervical spine: Anterior cervical fusion in the the upper cervical spine, partially visualized. There is fusion of the C4 and C5 vertebra noted. The visualized calvarium is unremarkable. Sinuses/Orbits: Bilateral lens replacement.  Orbits are symmetric. Other: Mastoid air cells are clear. MRA HEAD FINDINGS Anterior circulation: The intracranial internal carotid arteries are patent. The right M1 segment and right MCA bifurcation is patent. For right MCA branches are patent. Focal severe stenosis of an M3 segment of the right MCA. The left M1 segment and left MCA  bifurcation are patent. Left MCA branches are patent. The A1 and A2 segments are patent bilaterally. There is mild narrowing of the left A2 segment. Distal ACA branches are patent. Posterior circulation: Visualized intracranial vertebral arteries are patent. The basilar artery is patent. The posterior cerebral arteries are patent proximally. Distal PCA branches not well visualized. The posterior communicating arteries are visualized bilaterally. Superior cerebellar arteries visualized bilaterally. Anatomic variants: None significant. IMPRESSION: No acute intracranial abnormality. Focus of encephalomalacia in the left occipital lobe suggestive of remote infarct, new since January. Moderate chronic microvascular ischemic changes. Generalized parenchymal volume loss. No evidence of proximal occlusion on MRA. Distal PCA branches are not well evaluated. Consider CTA for further evaluation. Focal severe stenosis of an M3 branch of the right MCA. Electronically Signed   By: Denny Flack M.D.   On: 11/26/2023 22:21   DG Chest Port 1 View Result Date: 11/24/2023 CLINICAL DATA:  Weakness EXAM: PORTABLE CHEST 1 VIEW COMPARISON:  10/20/2023, CT 01/13/2022 FINDINGS: Hardware in the cervical spine.mild diffuse increased interstitial opacity likely due to underlying chronic lung disease. No acute airspace disease, pleural effusion or pneumothorax. Cardiomediastinal silhouette within normal limits with aortic atherosclerosis. IMPRESSION: No active disease. Chronic lung disease. Electronically Signed   By: Esmeralda Hedge M.D.   On: 11/24/2023 15:13    Microbiology: Results for orders placed or performed during the hospital encounter of 10/20/23  Resp panel by RT-PCR (RSV, Flu A&B, Covid) Anterior Nasal Swab     Status: None   Collection Time: 10/20/23 11:23 AM   Specimen: Anterior Nasal Swab  Result Value Ref Range Status   SARS Coronavirus 2 by RT PCR NEGATIVE NEGATIVE Final    Comment: (NOTE) SARS-CoV-2 target  nucleic acids are NOT DETECTED.  The SARS-CoV-2 RNA is generally detectable in upper respiratory specimens during the acute phase of infection. The lowest concentration of SARS-CoV-2 viral copies this assay can detect is 138 copies/mL. A negative result does not preclude SARS-Cov-2 infection and should not be used as the sole basis for treatment or other patient management decisions. A negative result may occur with  improper specimen collection/handling, submission of specimen other than nasopharyngeal swab, presence of viral mutation(s) within the areas targeted by this assay, and inadequate number of viral copies(<138 copies/mL). A negative result must be combined with clinical observations, patient history, and epidemiological information. The expected result is Negative.  Fact Sheet for Patients:  BloggerCourse.com  Fact Sheet for Healthcare Providers:  SeriousBroker.it  This test is no t yet approved or cleared by the [place]  FDA and  has been authorized for detection and/or diagnosis of SARS-CoV-2 by FDA under an Emergency Use Authorization (EUA). This EUA will remain  in effect (meaning this test can be used) for the duration of the COVID-19 declaration under Section 564(b)(1) of the Act, 21 U.S.C.section 360bbb-3(b)(1), unless the authorization is terminated  or revoked sooner.       Influenza A by  PCR NEGATIVE NEGATIVE Final   Influenza B by PCR NEGATIVE NEGATIVE Final    Comment: (NOTE) The Xpert Xpress SARS-CoV-2/FLU/RSV plus assay is intended as an aid in the diagnosis of influenza from Nasopharyngeal swab specimens and should not be used as a sole basis for treatment. Nasal washings and aspirates are unacceptable for Xpert Xpress SARS-CoV-2/FLU/RSV testing.  Fact Sheet for Patients: BloggerCourse.com  Fact Sheet for Healthcare  Providers: SeriousBroker.it  This test is not yet approved or cleared by the [place]  FDA and has been authorized for detection and/or diagnosis of SARS-CoV-2 by FDA under an Emergency Use Authorization (EUA). This EUA will remain in effect (meaning this test can be used) for the duration of the COVID-19 declaration under Section 564(b)(1) of the Act, 21 U.S.C. section 360bbb-3(b)(1), unless the authorization is terminated or revoked.     Resp Syncytial Virus by PCR NEGATIVE NEGATIVE Final    Comment: (NOTE) Fact Sheet for Patients: BloggerCourse.com  Fact Sheet for Healthcare Providers: SeriousBroker.it  This test is not yet approved or cleared by the [place]  FDA and has been authorized for detection and/or diagnosis of SARS-CoV-2 by FDA under an Emergency Use Authorization (EUA). This EUA will remain in effect (meaning this test can be used) for the duration of the COVID-19 declaration under Section 564(b)(1) of the Act, 21 U.S.C. section 360bbb-3(b)(1), unless the authorization is terminated or revoked.  Performed at [facility], [address], [place], [place] [address]   Blood culture (routine x 2)     Status: None   Collection Time: 10/20/23 11:25 AM   Specimen: BLOOD  Result Value Ref Range Status   Specimen Description BLOOD RIGHT ANTECUBITAL  Final   Special Requests   Final    BOTTLES DRAWN AEROBIC AND ANAEROBIC Blood Culture adequate volume   Culture   Final    NO GROWTH 5 DAYS Performed at [facility], [address], [place], [place] [address]    Report Status 10/25/2023 FINAL  Final  Blood culture (routine x 2)     Status: None   Collection Time: 10/20/23 11:27 AM   Specimen: BLOOD  Result Value Ref Range Status   Specimen Description BLOOD BLOOD RIGHT FOREARM  Final   Special Requests   Final    BOTTLES DRAWN AEROBIC ONLY Blood Culture results may not be optimal due to  an inadequate volume of blood received in culture bottles   Culture   Final    NO GROWTH 5 DAYS Performed at [facility], [address], [place], [place] [address]    Report Status 10/25/2023 FINAL  Final  MRSA Next Gen by PCR, Nasal     Status: Abnormal   Collection Time: 10/20/23 12:00 PM   Specimen: Nasal Mucosa; Nasal Swab  Result Value Ref Range Status   MRSA by PCR Next Gen DETECTED (A) NOT DETECTED Final    Comment: RESULT CALLED TO, READ BACK BY AND VERIFIED WITH: EDGAR TINAJERO @ 1731 ON 10/20/23 C VARNER (NOTE) The GeneXpert MRSA Assay (FDA approved for NASAL specimens only), is one component of a comprehensive MRSA colonization surveillance program. It is not intended to diagnose MRSA infection nor to guide or monitor treatment for MRSA infections. Test performance is not FDA approved in patients less than 97 years old. Performed at [facility], [address], [place], [place] [address]     Labs: CBC: Recent Labs  Lab 11/24/23 1004 11/24/23 1012 11/24/23 1028 11/25/23 0441  WBC 7.1  --   --  4.9  NEUTROABS 4.3  --   --   --   HGB 14.4 14.3 15.6* 12.5  HCT 41.1 42.0 46.0 37.7  MCV 87.8  --   --  90.6  PLT 149*  --   --  102*   Basic Metabolic Panel: Recent Labs  Lab 11/24/23 1004 11/24/23 1012 11/24/23 1028 11/25/23 0441 11/26/23 0422 11/27/23 0442  NA 141 141 141 142 142 139  K 2.3* 2.3* 2.4* 3.0* 3.8 4.0  CL 99 99 97* 111 112* 110  CO2 31  --   --  25 23 24   GLUCOSE 106* 105* 103* 89 97 105*  BUN 34* 36* 37* 29* 25* 28*  CREATININE 2.47* 2.60* 2.40* 2.15* 2.01* 1.84*  CALCIUM  9.9  --   --  8.7* 8.7* 8.9  MG 2.6*  --   --  2.8* 2.6* 2.5*  PHOS 3.8  --   --   --   --   --    Liver Function Tests: Recent Labs  Lab 11/24/23 1004 11/25/23 0441  AST 23 18  ALT 14 12  ALKPHOS 65 51  BILITOT 0.4 0.4  PROT 7.7 6.3*  ALBUMIN  4.2 3.4*   CBG: No results for input(s): "GLUCAP" in the last 168 hours.  Discharge time spent: greater than 30  minutes.  Signed: Demaris Fillers, MD [facility] 11/27/2023

## 2023-11-27 NOTE — TOC Transition Note (Signed)
 Transition of Care [facility]) - Discharge Note   Patient Details  Name: [name] MRN: [id number] Date of Birth: 10/30/1950  [facility] CM/SW Contact:  Ander Katos, LCSW Phone Number: 11/27/2023, 11:38 AM   Clinical Narrative:  Pt d/c today back to [facility]. Pt and facility aware and agreeable. Pt reports she will notify family this evening. D/C summary and FL2 sent to ALF. Facility to pick up pt this afternoon.      Final next level of care: Assisted Living Barriers to Discharge: Barriers Resolved   Patient Goals and CMS Choice Patient states their goals for this hospitalization and ongoing recovery are:: To get better. Pt states she's been "falling apart" for the past few months. CMS Medicare.gov Compare Post Acute Care list provided to:: Patient Choice offered to / list presented to : Patient [place] ownership interest in [facility].provided to:: Patient    Discharge Placement                Patient to be transferred to facility by: facility transport Name of family member notified: pt to notify family Patient and family notified of of transfer: 11/27/23  Discharge Plan and Services Additional resources added to the After Visit Summary for       Post Acute Care Choice: Skilled Nursing Facility                               Social Drivers of Health (SDOH) Interventions SDOH Screenings   Food Insecurity: No Food Insecurity (11/24/2023)  Housing: Low Risk  (11/24/2023)  Transportation Needs: No Transportation Needs (11/24/2023)  Utilities: Not At Risk (11/24/2023)  Depression (PHQ2-9): Low Risk  (03/11/2020)  Financial Resource Strain: Medium Risk (07/31/2023)   Received from [facility]  Social Connections: Unknown (11/25/2023)  Tobacco Use: Medium Risk (11/24/2023)  Health Literacy: High Risk (07/31/2023)   [facility] from [facility]     Readmission Risk Interventions    11/27/2023    7:51 AM 10/20/2023     8:13 PM  Readmission Risk Prevention Plan  Transportation Screening Complete Complete  PCP or Specialist Appt within 3-5 Days  Complete  HRI or Home Care Consult  Complete  Social Work Consult for Recovery Care Planning/Counseling  Complete  Palliative Care Screening  Not Applicable  Medication Review Oceanographer) Complete Complete  HRI or Home Care Consult Complete   SW Recovery Care/Counseling Consult Complete   Palliative Care Screening Not Applicable   Skilled Nursing Facility Not Applicable

## 2023-11-27 NOTE — Progress Notes (Signed)
 Mobility Specialist Progress Note:    11/27/23 1050  Mobility  Activity Ambulated with assistance in hallway  Level of Assistance Modified independent, requires aide device or extra time  Assistive Device Advanced [employer] Ambulated (ft) 300 ft  Range of Motion/Exercises Active;All extremities  Activity Response Tolerated well  Mobility Referral Yes  Mobility visit 1 Mobility  Mobility Specialist Start Time (ACUTE ONLY) 1050  Mobility Specialist Stop Time (ACUTE ONLY) 1115  Mobility Specialist Time Calculation (min) (ACUTE ONLY) 25 min   Pt received in chair, agreeable to mobility. ModI to stand and ambulate with cane. Tolerated well, c/o dizziness EOS. Returned pt to chair, alarm on. Call bell in reach, all needs met.  BP readings: Sitting: 149/44 Standing: 154/42 Sitting EOS: 162/49  Andriel Omalley Mobility Specialist Please contact via Special educational needs teacher or  Rehab office at [phone]

## 2023-11-27 NOTE — [place] FL2 (Signed)
 [place]  MEDICAID FL2 LEVEL OF CARE FORM     IDENTIFICATION  Patient Name: [name] Birthdate: 02-16-1951 Sex: female Admission Date (Current Location): 11/24/2023  [place] and [place] Number:  [name] [id number] R Facility and Address:  [facility],  [address], [name] [address]      Provider Number: [id number]  Attending Physician Name and Address:  Demaris Fillers, MD  Relative Name and Phone Number:  [name] (Daughter)  [phone]    Current Level of Care: Hospital Recommended Level of Care: Assisted Living Facility Prior Approval Number:    Date Approved/Denied:   PASRR Number:    Discharge Plan:  (ALF)    Current Diagnoses: Patient Active Problem List   Diagnosis Date Noted   Acute renal failure superimposed on stage 3b chronic kidney disease, unspecified acute renal failure type (HCC) 11/25/2023   Acute kidney injury superimposed on CKD (HCC) 11/24/2023   Chronic diastolic HF (heart failure) (HCC) 10/23/2023   Acute hypoxemic respiratory failure (HCC) 10/20/2023   Status post carotid endarterectomy 07/03/2023   Symptomatic carotid artery stenosis, right 07/03/2023   Anxiety state 06/10/2023   Relational problem 06/10/2023   Acute coronary syndrome with high troponin (HCC) 11/20/2022   Chronic kidney disease stage IIIb 11/18/2022   Obesity (BMI 30-39.9) 11/18/2022   Rheumatoid arthritis (HCC) 11/17/2022   Fibromyalgia 11/17/2022   Hypothyroidism 11/17/2022   Progressive angina with severe left main stenosis 11/16/2022   IPF (idiopathic pulmonary fibrosis) (HCC) 02/25/2022   Abdominal pain, epigastric 11/26/2021   Nausea without vomiting 11/26/2021   Constipation 11/26/2021   Tobacco abuse 11/16/2021   Abnormal chest CT 11/16/2021   Abnormal nuclear cardiac imaging test    Mixed incontinence 07/16/2021   History of UTI 07/16/2021   AMS (altered mental status) 03/11/2020   AKI (acute kidney injury) (HCC) 03/11/2020    Hypokalemia 03/11/2020   Altered mental status 03/11/2020   Depression    Prolonged QT interval    Unilateral primary osteoarthritis, right knee 02/22/2020   Gastroesophageal reflux disease without esophagitis 01/25/2018   Encounter for colonoscopy due to history of adenomatous colonic polyps 03/23/2017   Melena 01/31/2014   Aftercare following surgery of the circulatory system, NEC 01/30/2014   Tooth pain 05/03/2013   GERD (gastroesophageal reflux disease) 02/16/2013   Bloating 02/16/2013   Renal artery stenosis (HCC) 11/04/2012   PAD (peripheral artery disease) (HCC) 05/07/2012   Shortness of breath 04/14/2012   Carotid artery stenosis 11/13/2009   Hyperlipidemia 04/19/2009   Essential hypertension 03/12/2009   CAD (coronary artery disease) 03/12/2009    Orientation RESPIRATION BLADDER Height & Weight     Self, Time, Situation, Place  Normal Continent Weight: 134 lb 4.2 oz (60.9 kg) Height:  5' (152.4 cm)  BEHAVIORAL SYMPTOMS/MOOD NEUROLOGICAL BOWEL NUTRITION STATUS      Continent Diet (Cardiac diet)  AMBULATORY STATUS COMMUNICATION OF NEEDS Skin   Limited Assist Verbally Bruising                       Personal Care Assistance Level of Assistance  Bathing, Feeding, Dressing Bathing Assistance: Limited assistance Feeding assistance: Independent Dressing Assistance: Limited assistance     Functional Limitations Info  Sight, Hearing, Speech Sight Info: Adequate Hearing Info: Adequate Speech Info: Adequate    SPECIAL CARE FACTORS FREQUENCY  PT (By licensed PT)     PT Frequency: [place] PT/OT              Contractures Contractures Info: Not  present    Additional Factors Info  Code Status, Allergies Code Status Info: Full Allergies Info: Celecoxib  Citalopram  Ciprofloxacin Hcl  Dicyclomine   Lamotrigine  Metoclopramide  Milnacipran Hcl  Omeprazole  Pregabalin  Serotonin Reuptake Inhibitors (Ssris)  Simvastatin   Tape  Tizanidine  Trazodone  And Nefazodone   Venlafaxine  Codeine  Nefazodone  Sertraline  Hcl           Current Medications (11/27/2023):  This is the current hospital active medication list Current Facility-Administered Medications  Medication Dose Route Frequency Provider Last Rate Last Admin   0.9 % NaCl with KCl 20 mEq/ L  infusion   Intravenous Continuous Tat, David, MD   Stopped at 11/26/23 1530   acetaminophen  (TYLENOL ) tablet 650 mg  650 mg Oral Q6H PRN Justina Oman, MD   650 mg at 11/26/23 2109   Or   acetaminophen  (TYLENOL ) suppository 650 mg  650 mg Rectal Q6H PRN Justina Oman, MD       acetaminophen  (TYLENOL ) tablet 1,000 mg  1,000 mg Oral Q8H Tat, David, MD   1,000 mg at 11/27/23 4098   ALPRAZolam  (XANAX ) tablet 0.5 mg  0.5 mg Oral Q8H PRN Justina Oman, MD   0.5 mg at 11/27/23 1191   aspirin  EC tablet 81 mg  81 mg Oral Daily Justina Oman, MD   81 mg at 11/27/23 1006   clopidogrel  (PLAVIX ) tablet 75 mg  75 mg Oral Daily Justina Oman, MD   75 mg at 11/27/23 1005   cyanocobalamin  (VITAMIN B12) tablet 1,000 mcg  1,000 mcg Oral Daily Justina Oman, MD   1,000 mcg at 11/27/23 1004   heparin  injection 5,000 Units  5,000 Units Subcutaneous Q8H Justina Oman, MD       levothyroxine  (SYNTHROID ) tablet 25 mcg  25 mcg Oral QAC breakfast Justina Oman, MD   25 mcg at 11/27/23 4782   midodrine  (PROAMATINE ) tablet 5 mg  5 mg Oral TID WC Demaris Fillers, MD   5 mg at 11/27/23 9562   Oral care mouth rinse  15 mL Mouth Rinse PRN Justina Oman, MD       pantoprazole  (PROTONIX ) EC tablet 40 mg  40 mg Oral Daily Justina Oman, MD   40 mg at 11/27/23 1005   prochlorperazine  (COMPAZINE ) injection 10 mg  10 mg Intravenous Q6H PRN Justina Oman, MD   10 mg at 11/24/23 1600   rosuvastatin  (CRESTOR ) tablet 40 mg  40 mg Oral Daily Justina Oman, MD   40 mg at 11/27/23 1005     Discharge Medications: TAKE these medications     acetaminophen  325 MG tablet Commonly known as: TYLENOL  Take 325 mg by mouth every 6 (six) hours as needed  for mild pain (pain score 1-3).    albuterol  108 (90 Base) MCG/ACT inhaler Commonly known as: VENTOLIN  HFA Inhale 1 puff into the lungs 4 (four) times daily as needed for wheezing or shortness of breath.    aspirin  EC 81 MG tablet Take 81 mg by mouth daily.    clopidogrel  75 MG tablet Commonly known as: PLAVIX  Take 1 tablet (75 mg total) by mouth daily.    cyanocobalamin  1000 MCG tablet Commonly known as: VITAMIN B12 Take 1 tablet (1,000 mcg total) by mouth daily.    dapagliflozin  propanediol 10 MG Tabs tablet Commonly known as: FARXIGA  Take 1 tablet (10 mg total) by mouth daily.    EPINEPHrine 0.3 mg/0.3 mL Soaj injection Commonly known as: EPI-PEN Inject 0.3 mg into the muscle as needed for  anaphylaxis.    levothyroxine  25 MCG tablet Commonly known as: SYNTHROID  Take 25 mcg by mouth daily before breakfast.    meclizine 12.5 MG tablet Commonly known as: ANTIVERT Take 12.5 mg by mouth 2 (two) times daily.    methocarbamol 500 MG tablet Commonly known as: ROBAXIN Take 1,000 mg by mouth every 6 (six) hours as needed (pain).    midodrine  5 MG tablet Commonly known as: PROAMATINE  Take 1 tablet (5 mg total) by mouth 3 (three) times daily with meals. What changed: when to take this    nitroGLYCERIN  0.4 MG SL tablet Commonly known as: NITROSTAT  Place 1 tablet (0.4 mg total) under the tongue every 5 (five) minutes x 3 doses as needed for chest pain (if no relief after 2nd dose, proceed to ED or call 911).    ondansetron  4 MG tablet Commonly known as: ZOFRAN  Take 4 mg by mouth every 8 (eight) hours as needed for nausea or vomiting.    Oyster Shell Calcium  500 MG Tabs Take 1 tablet by mouth 2 (two) times daily.    pantoprazole  40 MG tablet Commonly known as: PROTONIX  Take 40 mg by mouth daily.    rosuvastatin  40 MG tablet Commonly known as: CRESTOR  Take 1 tablet (40 mg total) by mouth daily.    torsemide  20 MG tablet Commonly known as: DEMADEX  Take 1 tablet (20 mg  total) by mouth daily. Start taking on: Nov 28, 2023 What changed: how much to take    Vitamin D  (Ergocalciferol ) 1.25 MG (50000 UNIT) Caps capsule Commonly known as: DRISDOL  Take 50,000 Units by mouth every Monday.           Relevant Imaging Results:  Relevant Lab Results:   Additional Information SS# [id number]  Ander [place], [place]

## 2023-11-27 NOTE — Plan of Care (Signed)
  Problem: Clinical Measurements: Goal: Ability to maintain clinical measurements within normal limits will improve Outcome: Progressing Goal: Diagnostic test results will improve Outcome: Progressing   Problem: Coping: Goal: Level of anxiety will decrease Outcome: Progressing   

## 2023-12-01 DIAGNOSIS — M6281 Muscle weakness (generalized): Secondary | ICD-10-CM | POA: Diagnosis not present

## 2023-12-02 DIAGNOSIS — I10 Essential (primary) hypertension: Secondary | ICD-10-CM | POA: Diagnosis not present

## 2023-12-02 DIAGNOSIS — Z299 Encounter for prophylactic measures, unspecified: Secondary | ICD-10-CM | POA: Diagnosis not present

## 2023-12-02 DIAGNOSIS — M6281 Muscle weakness (generalized): Secondary | ICD-10-CM | POA: Diagnosis not present

## 2023-12-02 DIAGNOSIS — N1832 Chronic kidney disease, stage 3b: Secondary | ICD-10-CM | POA: Diagnosis not present

## 2023-12-02 DIAGNOSIS — Z09 Encounter for follow-up examination after completed treatment for conditions other than malignant neoplasm: Secondary | ICD-10-CM | POA: Diagnosis not present

## 2023-12-02 DIAGNOSIS — I5032 Chronic diastolic (congestive) heart failure: Secondary | ICD-10-CM | POA: Diagnosis not present

## 2023-12-02 DIAGNOSIS — H00019 Hordeolum externum unspecified eye, unspecified eyelid: Secondary | ICD-10-CM | POA: Diagnosis not present

## 2023-12-03 DIAGNOSIS — M6281 Muscle weakness (generalized): Secondary | ICD-10-CM | POA: Diagnosis not present

## 2023-12-09 DIAGNOSIS — M6281 Muscle weakness (generalized): Secondary | ICD-10-CM | POA: Diagnosis not present

## 2023-12-10 DIAGNOSIS — M6281 Muscle weakness (generalized): Secondary | ICD-10-CM | POA: Diagnosis not present

## 2023-12-10 DIAGNOSIS — Z79899 Other long term (current) drug therapy: Secondary | ICD-10-CM | POA: Diagnosis not present

## 2023-12-15 DIAGNOSIS — M6281 Muscle weakness (generalized): Secondary | ICD-10-CM | POA: Diagnosis not present

## 2023-12-17 DIAGNOSIS — M6281 Muscle weakness (generalized): Secondary | ICD-10-CM | POA: Diagnosis not present

## 2023-12-23 DIAGNOSIS — M6281 Muscle weakness (generalized): Secondary | ICD-10-CM | POA: Diagnosis not present

## 2023-12-24 DIAGNOSIS — R5383 Other fatigue: Secondary | ICD-10-CM | POA: Diagnosis not present

## 2023-12-24 DIAGNOSIS — J069 Acute upper respiratory infection, unspecified: Secondary | ICD-10-CM | POA: Diagnosis not present

## 2023-12-24 DIAGNOSIS — I10 Essential (primary) hypertension: Secondary | ICD-10-CM | POA: Diagnosis not present

## 2023-12-25 DIAGNOSIS — M6281 Muscle weakness (generalized): Secondary | ICD-10-CM | POA: Diagnosis not present

## 2023-12-28 DIAGNOSIS — M6281 Muscle weakness (generalized): Secondary | ICD-10-CM | POA: Diagnosis not present

## 2023-12-30 DIAGNOSIS — M6281 Muscle weakness (generalized): Secondary | ICD-10-CM | POA: Diagnosis not present

## 2023-12-31 DIAGNOSIS — M6281 Muscle weakness (generalized): Secondary | ICD-10-CM | POA: Diagnosis not present

## 2024-01-01 DIAGNOSIS — M6281 Muscle weakness (generalized): Secondary | ICD-10-CM | POA: Diagnosis not present

## 2024-01-04 DIAGNOSIS — M6281 Muscle weakness (generalized): Secondary | ICD-10-CM | POA: Diagnosis not present

## 2024-01-05 DIAGNOSIS — M6281 Muscle weakness (generalized): Secondary | ICD-10-CM | POA: Diagnosis not present

## 2024-01-11 DIAGNOSIS — M6281 Muscle weakness (generalized): Secondary | ICD-10-CM | POA: Diagnosis not present

## 2024-01-12 DIAGNOSIS — M6281 Muscle weakness (generalized): Secondary | ICD-10-CM | POA: Diagnosis not present

## 2024-01-13 DIAGNOSIS — M6281 Muscle weakness (generalized): Secondary | ICD-10-CM | POA: Diagnosis not present

## 2024-01-15 DIAGNOSIS — M6281 Muscle weakness (generalized): Secondary | ICD-10-CM | POA: Diagnosis not present

## 2024-01-18 DIAGNOSIS — M6281 Muscle weakness (generalized): Secondary | ICD-10-CM | POA: Diagnosis not present

## 2024-01-19 DIAGNOSIS — M6281 Muscle weakness (generalized): Secondary | ICD-10-CM | POA: Diagnosis not present

## 2024-01-20 DIAGNOSIS — M6281 Muscle weakness (generalized): Secondary | ICD-10-CM | POA: Diagnosis not present

## 2024-01-22 DIAGNOSIS — M6281 Muscle weakness (generalized): Secondary | ICD-10-CM | POA: Diagnosis not present

## 2024-01-25 DIAGNOSIS — M6281 Muscle weakness (generalized): Secondary | ICD-10-CM | POA: Diagnosis not present

## 2024-01-27 DIAGNOSIS — M6281 Muscle weakness (generalized): Secondary | ICD-10-CM | POA: Diagnosis not present

## 2024-02-01 DIAGNOSIS — M6281 Muscle weakness (generalized): Secondary | ICD-10-CM | POA: Diagnosis not present

## 2024-02-03 DIAGNOSIS — M6281 Muscle weakness (generalized): Secondary | ICD-10-CM | POA: Diagnosis not present

## 2024-02-04 DIAGNOSIS — M6281 Muscle weakness (generalized): Secondary | ICD-10-CM | POA: Diagnosis not present

## 2024-02-08 DIAGNOSIS — M6281 Muscle weakness (generalized): Secondary | ICD-10-CM | POA: Diagnosis not present

## 2024-02-09 DIAGNOSIS — M6281 Muscle weakness (generalized): Secondary | ICD-10-CM | POA: Diagnosis not present

## 2024-02-11 DIAGNOSIS — M6281 Muscle weakness (generalized): Secondary | ICD-10-CM | POA: Diagnosis not present

## 2024-02-12 DIAGNOSIS — M6281 Muscle weakness (generalized): Secondary | ICD-10-CM | POA: Diagnosis not present

## 2024-02-15 DIAGNOSIS — N1832 Chronic kidney disease, stage 3b: Secondary | ICD-10-CM | POA: Diagnosis not present

## 2024-02-15 DIAGNOSIS — I10 Essential (primary) hypertension: Secondary | ICD-10-CM | POA: Diagnosis not present

## 2024-02-15 DIAGNOSIS — J849 Interstitial pulmonary disease, unspecified: Secondary | ICD-10-CM | POA: Diagnosis not present

## 2024-02-15 DIAGNOSIS — M179 Osteoarthritis of knee, unspecified: Secondary | ICD-10-CM | POA: Diagnosis not present

## 2024-02-15 DIAGNOSIS — M6281 Muscle weakness (generalized): Secondary | ICD-10-CM | POA: Diagnosis not present

## 2024-02-15 DIAGNOSIS — Z299 Encounter for prophylactic measures, unspecified: Secondary | ICD-10-CM | POA: Diagnosis not present

## 2024-02-15 DIAGNOSIS — I5032 Chronic diastolic (congestive) heart failure: Secondary | ICD-10-CM | POA: Diagnosis not present

## 2024-02-16 DIAGNOSIS — M6281 Muscle weakness (generalized): Secondary | ICD-10-CM | POA: Diagnosis not present

## 2024-02-18 DIAGNOSIS — M6281 Muscle weakness (generalized): Secondary | ICD-10-CM | POA: Diagnosis not present

## 2024-02-19 DIAGNOSIS — M6281 Muscle weakness (generalized): Secondary | ICD-10-CM | POA: Diagnosis not present

## 2024-02-22 DIAGNOSIS — M6281 Muscle weakness (generalized): Secondary | ICD-10-CM | POA: Diagnosis not present

## 2024-02-23 DIAGNOSIS — M6281 Muscle weakness (generalized): Secondary | ICD-10-CM | POA: Diagnosis not present

## 2024-02-24 DIAGNOSIS — M6281 Muscle weakness (generalized): Secondary | ICD-10-CM | POA: Diagnosis not present

## 2024-02-25 DIAGNOSIS — M6281 Muscle weakness (generalized): Secondary | ICD-10-CM | POA: Diagnosis not present

## 2024-02-26 DIAGNOSIS — M1711 Unilateral primary osteoarthritis, right knee: Secondary | ICD-10-CM | POA: Diagnosis not present

## 2024-02-29 DIAGNOSIS — M6281 Muscle weakness (generalized): Secondary | ICD-10-CM | POA: Diagnosis not present

## 2024-03-01 DIAGNOSIS — M6281 Muscle weakness (generalized): Secondary | ICD-10-CM | POA: Diagnosis not present

## 2024-03-02 DIAGNOSIS — M6281 Muscle weakness (generalized): Secondary | ICD-10-CM | POA: Diagnosis not present

## 2024-03-03 DIAGNOSIS — M6281 Muscle weakness (generalized): Secondary | ICD-10-CM | POA: Diagnosis not present

## 2024-03-07 DIAGNOSIS — M6281 Muscle weakness (generalized): Secondary | ICD-10-CM | POA: Diagnosis not present

## 2024-03-08 DIAGNOSIS — H353112 Nonexudative age-related macular degeneration, right eye, intermediate dry stage: Secondary | ICD-10-CM | POA: Diagnosis not present

## 2024-03-08 DIAGNOSIS — M6281 Muscle weakness (generalized): Secondary | ICD-10-CM | POA: Diagnosis not present

## 2024-03-09 DIAGNOSIS — M6281 Muscle weakness (generalized): Secondary | ICD-10-CM | POA: Diagnosis not present

## 2024-03-10 DIAGNOSIS — M6281 Muscle weakness (generalized): Secondary | ICD-10-CM | POA: Diagnosis not present

## 2024-03-15 DIAGNOSIS — M6281 Muscle weakness (generalized): Secondary | ICD-10-CM | POA: Diagnosis not present

## 2024-03-16 DIAGNOSIS — M6281 Muscle weakness (generalized): Secondary | ICD-10-CM | POA: Diagnosis not present

## 2024-03-17 DIAGNOSIS — M6281 Muscle weakness (generalized): Secondary | ICD-10-CM | POA: Diagnosis not present

## 2024-03-21 DIAGNOSIS — M6281 Muscle weakness (generalized): Secondary | ICD-10-CM | POA: Diagnosis not present

## 2024-03-22 DIAGNOSIS — M6281 Muscle weakness (generalized): Secondary | ICD-10-CM | POA: Diagnosis not present

## 2024-03-23 DIAGNOSIS — M6281 Muscle weakness (generalized): Secondary | ICD-10-CM | POA: Diagnosis not present

## 2024-03-24 DIAGNOSIS — M6281 Muscle weakness (generalized): Secondary | ICD-10-CM | POA: Diagnosis not present

## 2024-03-28 DIAGNOSIS — M6281 Muscle weakness (generalized): Secondary | ICD-10-CM | POA: Diagnosis not present

## 2024-03-29 DIAGNOSIS — M6281 Muscle weakness (generalized): Secondary | ICD-10-CM | POA: Diagnosis not present

## 2024-03-30 DIAGNOSIS — Z23 Encounter for immunization: Secondary | ICD-10-CM | POA: Diagnosis not present

## 2024-03-30 DIAGNOSIS — R5383 Other fatigue: Secondary | ICD-10-CM | POA: Diagnosis not present

## 2024-03-30 DIAGNOSIS — R110 Nausea: Secondary | ICD-10-CM | POA: Diagnosis not present

## 2024-03-30 DIAGNOSIS — R519 Headache, unspecified: Secondary | ICD-10-CM | POA: Diagnosis not present

## 2024-03-30 DIAGNOSIS — Z79899 Other long term (current) drug therapy: Secondary | ICD-10-CM | POA: Diagnosis not present

## 2024-03-30 DIAGNOSIS — I10 Essential (primary) hypertension: Secondary | ICD-10-CM | POA: Diagnosis not present

## 2024-03-30 DIAGNOSIS — R52 Pain, unspecified: Secondary | ICD-10-CM | POA: Diagnosis not present

## 2024-03-30 DIAGNOSIS — E7800 Pure hypercholesterolemia, unspecified: Secondary | ICD-10-CM | POA: Diagnosis not present

## 2024-03-30 DIAGNOSIS — M6281 Muscle weakness (generalized): Secondary | ICD-10-CM | POA: Diagnosis not present

## 2024-03-30 DIAGNOSIS — Z0000 Encounter for general adult medical examination without abnormal findings: Secondary | ICD-10-CM | POA: Diagnosis not present

## 2024-03-30 DIAGNOSIS — Z299 Encounter for prophylactic measures, unspecified: Secondary | ICD-10-CM | POA: Diagnosis not present

## 2024-04-04 DIAGNOSIS — R1032 Left lower quadrant pain: Secondary | ICD-10-CM | POA: Diagnosis not present

## 2024-04-04 DIAGNOSIS — M6281 Muscle weakness (generalized): Secondary | ICD-10-CM | POA: Diagnosis not present

## 2024-04-04 DIAGNOSIS — J069 Acute upper respiratory infection, unspecified: Secondary | ICD-10-CM | POA: Diagnosis not present

## 2024-04-04 DIAGNOSIS — R197 Diarrhea, unspecified: Secondary | ICD-10-CM | POA: Diagnosis not present

## 2024-04-05 DIAGNOSIS — M6281 Muscle weakness (generalized): Secondary | ICD-10-CM | POA: Diagnosis not present

## 2024-04-06 DIAGNOSIS — M6281 Muscle weakness (generalized): Secondary | ICD-10-CM | POA: Diagnosis not present

## 2024-04-07 DIAGNOSIS — M6281 Muscle weakness (generalized): Secondary | ICD-10-CM | POA: Diagnosis not present

## 2024-04-11 ENCOUNTER — Emergency Department (HOSPITAL_COMMUNITY)

## 2024-04-11 ENCOUNTER — Inpatient Hospital Stay (HOSPITAL_COMMUNITY)
Admission: EM | Admit: 2024-04-11 | Discharge: 2024-04-18 | DRG: 193 | Disposition: A | Discharge status: Skilled Nursing Facility | Source: Skilled Nursing Facility | Attending: Internal Medicine | Admitting: Internal Medicine

## 2024-04-11 ENCOUNTER — Encounter (HOSPITAL_COMMUNITY): Payer: Self-pay | Admitting: Emergency Medicine

## 2024-04-11 ENCOUNTER — Other Ambulatory Visit: Payer: Self-pay

## 2024-04-11 DIAGNOSIS — Z87891 Personal history of nicotine dependence: Secondary | ICD-10-CM

## 2024-04-11 DIAGNOSIS — Z96652 Presence of left artificial knee joint: Secondary | ICD-10-CM | POA: Diagnosis present

## 2024-04-11 DIAGNOSIS — Z7902 Long term (current) use of antithrombotics/antiplatelets: Secondary | ICD-10-CM

## 2024-04-11 DIAGNOSIS — K5900 Constipation, unspecified: Secondary | ICD-10-CM | POA: Diagnosis present

## 2024-04-11 DIAGNOSIS — F419 Anxiety disorder, unspecified: Secondary | ICD-10-CM | POA: Diagnosis present

## 2024-04-11 DIAGNOSIS — Z823 Family history of stroke: Secondary | ICD-10-CM

## 2024-04-11 DIAGNOSIS — E039 Hypothyroidism, unspecified: Secondary | ICD-10-CM | POA: Diagnosis present

## 2024-04-11 DIAGNOSIS — G8929 Other chronic pain: Secondary | ICD-10-CM | POA: Diagnosis not present

## 2024-04-11 DIAGNOSIS — J159 Unspecified bacterial pneumonia: Secondary | ICD-10-CM | POA: Diagnosis present

## 2024-04-11 DIAGNOSIS — Z833 Family history of diabetes mellitus: Secondary | ICD-10-CM

## 2024-04-11 DIAGNOSIS — Z83438 Family history of other disorder of lipoprotein metabolism and other lipidemia: Secondary | ICD-10-CM

## 2024-04-11 DIAGNOSIS — Z7982 Long term (current) use of aspirin: Secondary | ICD-10-CM

## 2024-04-11 DIAGNOSIS — G2581 Restless legs syndrome: Secondary | ICD-10-CM | POA: Diagnosis present

## 2024-04-11 DIAGNOSIS — M549 Dorsalgia, unspecified: Secondary | ICD-10-CM | POA: Diagnosis not present

## 2024-04-11 DIAGNOSIS — R195 Other fecal abnormalities: Secondary | ICD-10-CM | POA: Diagnosis present

## 2024-04-11 DIAGNOSIS — D631 Anemia in chronic kidney disease: Secondary | ICD-10-CM | POA: Diagnosis not present

## 2024-04-11 DIAGNOSIS — Z91048 Other nonmedicinal substance allergy status: Secondary | ICD-10-CM

## 2024-04-11 DIAGNOSIS — E876 Hypokalemia: Secondary | ICD-10-CM | POA: Diagnosis not present

## 2024-04-11 DIAGNOSIS — J9601 Acute respiratory failure with hypoxia: Secondary | ICD-10-CM | POA: Diagnosis present

## 2024-04-11 DIAGNOSIS — J189 Pneumonia, unspecified organism: Secondary | ICD-10-CM | POA: Diagnosis present

## 2024-04-11 DIAGNOSIS — Z955 Presence of coronary angioplasty implant and graft: Secondary | ICD-10-CM

## 2024-04-11 DIAGNOSIS — N1832 Chronic kidney disease, stage 3b: Secondary | ICD-10-CM | POA: Diagnosis present

## 2024-04-11 DIAGNOSIS — Z79890 Hormone replacement therapy (postmenopausal): Secondary | ICD-10-CM

## 2024-04-11 DIAGNOSIS — M6281 Muscle weakness (generalized): Secondary | ICD-10-CM | POA: Diagnosis not present

## 2024-04-11 DIAGNOSIS — I739 Peripheral vascular disease, unspecified: Secondary | ICD-10-CM | POA: Diagnosis present

## 2024-04-11 DIAGNOSIS — M069 Rheumatoid arthritis, unspecified: Secondary | ICD-10-CM | POA: Diagnosis not present

## 2024-04-11 DIAGNOSIS — I5033 Acute on chronic diastolic (congestive) heart failure: Secondary | ICD-10-CM | POA: Diagnosis not present

## 2024-04-11 DIAGNOSIS — Z79899 Other long term (current) drug therapy: Secondary | ICD-10-CM

## 2024-04-11 DIAGNOSIS — I951 Orthostatic hypotension: Secondary | ICD-10-CM | POA: Diagnosis present

## 2024-04-11 DIAGNOSIS — J181 Lobar pneumonia, unspecified organism: Secondary | ICD-10-CM | POA: Diagnosis present

## 2024-04-11 DIAGNOSIS — M542 Cervicalgia: Secondary | ICD-10-CM | POA: Diagnosis not present

## 2024-04-11 DIAGNOSIS — R1314 Dysphagia, pharyngoesophageal phase: Secondary | ICD-10-CM | POA: Diagnosis present

## 2024-04-11 DIAGNOSIS — I252 Old myocardial infarction: Secondary | ICD-10-CM

## 2024-04-11 DIAGNOSIS — I2510 Atherosclerotic heart disease of native coronary artery without angina pectoris: Secondary | ICD-10-CM | POA: Diagnosis not present

## 2024-04-11 DIAGNOSIS — Z91018 Allergy to other foods: Secondary | ICD-10-CM

## 2024-04-11 DIAGNOSIS — R109 Unspecified abdominal pain: Secondary | ICD-10-CM | POA: Diagnosis not present

## 2024-04-11 DIAGNOSIS — F32A Depression, unspecified: Secondary | ICD-10-CM | POA: Diagnosis present

## 2024-04-11 DIAGNOSIS — E66811 Obesity, class 1: Secondary | ICD-10-CM | POA: Diagnosis present

## 2024-04-11 DIAGNOSIS — M797 Fibromyalgia: Secondary | ICD-10-CM | POA: Diagnosis not present

## 2024-04-11 DIAGNOSIS — Z1152 Encounter for screening for COVID-19: Secondary | ICD-10-CM

## 2024-04-11 DIAGNOSIS — E669 Obesity, unspecified: Secondary | ICD-10-CM | POA: Diagnosis present

## 2024-04-11 DIAGNOSIS — I130 Hypertensive heart and chronic kidney disease with heart failure and stage 1 through stage 4 chronic kidney disease, or unspecified chronic kidney disease: Secondary | ICD-10-CM | POA: Diagnosis not present

## 2024-04-11 DIAGNOSIS — E782 Mixed hyperlipidemia: Secondary | ICD-10-CM | POA: Diagnosis not present

## 2024-04-11 DIAGNOSIS — Z8261 Family history of arthritis: Secondary | ICD-10-CM

## 2024-04-11 DIAGNOSIS — J84112 Idiopathic pulmonary fibrosis: Secondary | ICD-10-CM | POA: Diagnosis not present

## 2024-04-11 DIAGNOSIS — Z809 Family history of malignant neoplasm, unspecified: Secondary | ICD-10-CM

## 2024-04-11 DIAGNOSIS — E861 Hypovolemia: Secondary | ICD-10-CM | POA: Diagnosis not present

## 2024-04-11 DIAGNOSIS — Z8249 Family history of ischemic heart disease and other diseases of the circulatory system: Secondary | ICD-10-CM

## 2024-04-11 DIAGNOSIS — R079 Chest pain, unspecified: Secondary | ICD-10-CM | POA: Diagnosis not present

## 2024-04-11 DIAGNOSIS — K625 Hemorrhage of anus and rectum: Secondary | ICD-10-CM

## 2024-04-11 DIAGNOSIS — D649 Anemia, unspecified: Secondary | ICD-10-CM | POA: Diagnosis not present

## 2024-04-11 DIAGNOSIS — Z860101 Personal history of adenomatous and serrated colon polyps: Secondary | ICD-10-CM

## 2024-04-11 DIAGNOSIS — Z981 Arthrodesis status: Secondary | ICD-10-CM

## 2024-04-11 DIAGNOSIS — Z885 Allergy status to narcotic agent status: Secondary | ICD-10-CM

## 2024-04-11 DIAGNOSIS — R519 Headache, unspecified: Secondary | ICD-10-CM | POA: Diagnosis not present

## 2024-04-11 DIAGNOSIS — I701 Atherosclerosis of renal artery: Secondary | ICD-10-CM | POA: Diagnosis present

## 2024-04-11 DIAGNOSIS — R1319 Other dysphagia: Secondary | ICD-10-CM

## 2024-04-11 DIAGNOSIS — Z881 Allergy status to other antibiotic agents status: Secondary | ICD-10-CM

## 2024-04-11 DIAGNOSIS — K219 Gastro-esophageal reflux disease without esophagitis: Secondary | ICD-10-CM | POA: Diagnosis not present

## 2024-04-11 DIAGNOSIS — Z6831 Body mass index (BMI) 31.0-31.9, adult: Secondary | ICD-10-CM

## 2024-04-11 DIAGNOSIS — Z888 Allergy status to other drugs, medicaments and biological substances status: Secondary | ICD-10-CM

## 2024-04-11 HISTORY — DX: Unspecified macular degeneration: H35.30

## 2024-04-11 LAB — CK: Total CK: 57 U/L (ref 38–234)

## 2024-04-11 LAB — COMPREHENSIVE METABOLIC PANEL WITH GFR
ALT: 8 U/L (ref 0–44)
AST: 16 U/L (ref 15–41)
Albumin: 3.5 g/dL (ref 3.5–5.0)
Alkaline Phosphatase: 73 U/L (ref 38–126)
Anion gap: 15 (ref 5–15)
BUN: 21 mg/dL (ref 8–23)
CO2: 25 mmol/L (ref 22–32)
Calcium: 8.7 mg/dL — ABNORMAL LOW (ref 8.9–10.3)
Chloride: 96 mmol/L — ABNORMAL LOW (ref 98–111)
Creatinine, Ser: 1.32 mg/dL — ABNORMAL HIGH (ref 0.44–1.00)
GFR, Estimated: 42 mL/min — ABNORMAL LOW (ref >60)
Glucose, Bld: 103 mg/dL — ABNORMAL HIGH (ref 70–99)
Potassium: 3.7 mmol/L (ref 3.5–5.1)
Sodium: 136 mmol/L (ref 135–145)
Total Bilirubin: 0.8 mg/dL (ref 0.0–1.2)
Total Protein: 7.5 g/dL (ref 6.5–8.1)

## 2024-04-11 LAB — CBC WITH DIFFERENTIAL/PLATELET
Abs Immature Granulocytes: 0.05 K/uL (ref 0.00–0.07)
Basophils Absolute: 0 K/uL (ref 0.0–0.1)
Basophils Relative: 0 %
Eosinophils Absolute: 0.1 K/uL (ref 0.0–0.5)
Eosinophils Relative: 1 %
HCT: 36.7 % (ref 36.0–46.0)
Hemoglobin: 11.8 g/dL — ABNORMAL LOW (ref 12.0–15.0)
Immature Granulocytes: 1 %
Lymphocytes Relative: 8 %
Lymphs Abs: 0.8 K/uL (ref 0.7–4.0)
MCH: 31.3 pg (ref 26.0–34.0)
MCHC: 32.2 g/dL (ref 30.0–36.0)
MCV: 97.3 fL (ref 80.0–100.0)
Monocytes Absolute: 0.8 K/uL (ref 0.1–1.0)
Monocytes Relative: 8 %
Neutro Abs: 8 K/uL — ABNORMAL HIGH (ref 1.7–7.7)
Neutrophils Relative %: 82 %
Platelets: 189 K/uL (ref 150–400)
RBC: 3.77 MIL/uL — ABNORMAL LOW (ref 3.87–5.11)
RDW: 13.8 % (ref 11.5–15.5)
WBC: 9.8 K/uL (ref 4.0–10.5)
nRBC: 0 % (ref 0.0–0.2)

## 2024-04-11 LAB — RESP PANEL BY RT-PCR (RSV, FLU A&B, COVID)  RVPGX2
Influenza A by PCR: NEGATIVE
Influenza B by PCR: NEGATIVE
Resp Syncytial Virus by PCR: NEGATIVE
SARS Coronavirus 2 by RT PCR: NEGATIVE

## 2024-04-11 LAB — MAGNESIUM: Magnesium: 2.1 mg/dL (ref 1.7–2.4)

## 2024-04-11 LAB — LACTIC ACID, PLASMA: Lactic Acid, Venous: 0.7 mmol/L (ref 0.5–1.9)

## 2024-04-11 LAB — CBG MONITORING, ED: Glucose-Capillary: 106 mg/dL — ABNORMAL HIGH (ref 70–99)

## 2024-04-11 LAB — TROPONIN I (HIGH SENSITIVITY)
Troponin I (High Sensitivity): 17 ng/L (ref <18)
Troponin I (High Sensitivity): 15 ng/L (ref <18)

## 2024-04-11 MED ORDER — SODIUM CHLORIDE 0.9 % IV SOLN
500.0000 mg | Freq: Once | INTRAVENOUS | Status: AC
  Administered 2024-04-11: 500 mg via INTRAVENOUS
  Filled 2024-04-11: qty 5

## 2024-04-11 MED ORDER — SODIUM CHLORIDE 0.9 % IV SOLN
1.0000 g | Freq: Once | INTRAVENOUS | Status: AC
  Administered 2024-04-11: 1 g via INTRAVENOUS
  Filled 2024-04-11: qty 10

## 2024-04-11 MED ORDER — LACTATED RINGERS IV BOLUS (SEPSIS)
500.0000 mL | Freq: Once | INTRAVENOUS | Status: AC
  Administered 2024-04-11: 500 mL via INTRAVENOUS

## 2024-04-11 MED ORDER — DIPHENHYDRAMINE HCL 50 MG/ML IJ SOLN
12.5000 mg | Freq: Once | INTRAMUSCULAR | Status: AC
  Administered 2024-04-11: 12.5 mg via INTRAVENOUS
  Filled 2024-04-11: qty 1

## 2024-04-11 MED ORDER — LACTATED RINGERS IV BOLUS
1000.0000 mL | Freq: Once | INTRAVENOUS | Status: AC
  Administered 2024-04-11: 1000 mL via INTRAVENOUS

## 2024-04-11 MED ORDER — PROCHLORPERAZINE EDISYLATE 10 MG/2ML IJ SOLN
5.0000 mg | Freq: Once | INTRAMUSCULAR | Status: AC
  Administered 2024-04-11: 5 mg via INTRAVENOUS
  Filled 2024-04-11: qty 2

## 2024-04-11 MED ORDER — FENTANYL CITRATE (PF) 100 MCG/2ML IJ SOLN
50.0000 ug | Freq: Once | INTRAMUSCULAR | Status: AC
  Administered 2024-04-11: 50 ug via INTRAVENOUS
  Filled 2024-04-11: qty 2

## 2024-04-11 MED ORDER — ACETAMINOPHEN 325 MG PO TABS
650.0000 mg | ORAL_TABLET | Freq: Once | ORAL | Status: AC
  Administered 2024-04-11: 650 mg via ORAL
  Filled 2024-04-11: qty 2

## 2024-04-11 MED ORDER — IOHEXOL 350 MG/ML SOLN
75.0000 mL | Freq: Once | INTRAVENOUS | Status: AC | PRN
Start: 2024-04-11 — End: 2024-04-11
  Administered 2024-04-11: 75 mL via INTRAVENOUS

## 2024-04-11 MED ORDER — OXYCODONE-ACETAMINOPHEN 5-325 MG PO TABS
1.0000 | ORAL_TABLET | Freq: Once | ORAL | Status: AC
  Administered 2024-04-11: 1 via ORAL
  Filled 2024-04-11: qty 1

## 2024-04-11 NOTE — ED Notes (Signed)
 ED Provider at bedside.

## 2024-04-11 NOTE — ED Notes (Signed)
 Patient transported to CT

## 2024-04-11 NOTE — ED Notes (Signed)
 Dr Melvenia notified of repeat BP map <60. Pt states she feels fine but is noted to be slightly diaphoretic. Pt denies pain. See repeat vitals. Waiting on new orders.

## 2024-04-11 NOTE — ED Provider Notes (Addendum)
 [facility] AT [facility] Provider Note   CSN: [id number] Arrival date & time: 04/11/24  1723     Patient presents with: No chief complaint on file.   [name] is a 73 y.o. female.   HPI Patient presents for diffuse myalgias.  Medical history includes HTN, arthritis, anxiety, depression, fibromyalgia, CAD, sleep apnea, carotid artery stenosis, pulmonary fibrosis, CHF, CKD.  She is currently finishing a course of Augmentin for suspected URI.  She resides in a nursing facility.  At nursing facility, she was given Xanax  and Robaxin.  She is prescribed Tylenol  as needed but typically does not get it.  Last night she states that she felt fine.  This morning, she woke up with diffuse pain, notably in head, neck, back, chest, abdomen.    Prior to Admission medications   Medication Sig Start Date End Date Taking? Authorizing Provider  acetaminophen  (TYLENOL ) 325 MG tablet Take 325 mg by mouth every 6 (six) hours as needed for mild pain (pain score 1-3). 11/11/23   [provider]  albuterol  (VENTOLIN  HFA) 108 (90 Base) MCG/ACT inhaler Inhale 1 puff into the lungs 4 (four) times daily as needed for wheezing or shortness of breath. 04/22/21   [provider]  aspirin  81 MG EC tablet Take 81 mg by mouth daily.    [provider]  clopidogrel  (PLAVIX ) 75 MG tablet Take 1 tablet (75 mg total) by mouth daily. 12/25/22   Debera Jayson MATSU, MD  cyanocobalamin  (VITAMIN B12) 1000 MCG tablet Take 1 tablet (1,000 mcg total) by mouth daily. 10/26/23   Arrien, Elidia Sieving, MD  dapagliflozin  propanediol (FARXIGA ) 10 MG TABS tablet Take 1 tablet (10 mg total) by mouth daily. 10/27/23   Arrien, Mauricio Daniel, MD  EPINEPHrine 0.3 mg/0.3 mL IJ SOAJ injection Inject 0.3 mg into the muscle as needed for anaphylaxis. 07/13/23   [provider]  levothyroxine  (SYNTHROID ) 25 MCG tablet Take 25 mcg by mouth daily before breakfast. 02/16/20    [provider]  methocarbamol (ROBAXIN) 500 MG tablet Take 1,000 mg by mouth every 6 (six) hours as needed (pain). 11/23/23   [provider]  midodrine  (PROAMATINE ) 5 MG tablet Take 1 tablet (5 mg total) by mouth 3 (three) times daily with meals. 11/27/23   Evonnie Lenis, MD  nitroGLYCERIN  (NITROSTAT ) 0.4 MG SL tablet Place 1 tablet (0.4 mg total) under the tongue every 5 (five) minutes x 3 doses as needed for chest pain (if no relief after 2nd dose, proceed to ED or call 911). 06/08/23   Debera Jayson MATSU, MD  ondansetron  (ZOFRAN ) 4 MG tablet Take 4 mg by mouth every 8 (eight) hours as needed for nausea or vomiting. 06/03/21   [provider]  Allie Gutta Calcium  500 MG TABS Take 1 tablet by mouth 2 (two) times daily. 09/22/23   [provider]  pantoprazole  (PROTONIX ) 40 MG tablet Take 40 mg by mouth daily. 10/08/22   [provider]  rosuvastatin  (CRESTOR ) 40 MG tablet Take 1 tablet (40 mg total) by mouth daily. 10/26/23   Arrien, Mauricio Daniel, MD  torsemide  (DEMADEX ) 20 MG tablet Take 1 tablet (20 mg total) by mouth daily. 11/28/23   Evonnie Lenis, MD  Vitamin D , Ergocalciferol , (DRISDOL ) 50000 UNITS CAPS Take 50,000 Units by mouth every Monday.     [provider]    Allergies: Celecoxib, Citalopram, Ciprofloxacin hcl, Dicyclomine , Lamotrigine, Metoclopramide, Milnacipran hcl, Omeprazole, Pregabalin, Serotonin reuptake inhibitors (ssris), Simvastatin , Tape, Tizanidine,  Trazodone  and nefazodone, Venlafaxine, Codeine, Nefazodone, and Sertraline  hcl    Review of Systems  Musculoskeletal:  Positive for myalgias.  All other systems reviewed and are negative.   Updated Vital Signs BP (!) 124/44   Pulse 75   Temp 99.5 F (37.5 C) (Oral)   Resp (!) 23   Ht 5' (1.524 m)   Wt 60 kg   SpO2 95%   BMI 25.83 kg/m   Physical Exam Vitals and nursing note reviewed.  Constitutional:      General: She is not in acute distress.    Appearance:  Normal appearance. She is well-developed. She is not ill-appearing, toxic-appearing or diaphoretic.  HENT:     Head: Normocephalic and atraumatic.     Right Ear: External ear normal.     Left Ear: External ear normal.     Nose: Nose normal.     Mouth/Throat:     Mouth: Mucous membranes are moist.  Eyes:     Extraocular Movements: Extraocular movements intact.     Conjunctiva/sclera: Conjunctivae normal.  Cardiovascular:     Rate and Rhythm: Normal rate and regular rhythm.     Heart sounds: No murmur heard. Pulmonary:     Effort: Pulmonary effort is normal. No respiratory distress.     Breath sounds: Normal breath sounds. No wheezing or rales.  Chest:     Chest wall: Tenderness present.  Abdominal:     General: There is no distension.     Palpations: Abdomen is soft.     Tenderness: There is abdominal tenderness.  Musculoskeletal:        General: Tenderness present. No swelling.     Cervical back: Normal range of motion and neck supple.  Skin:    General: Skin is warm and dry.     Coloration: Skin is not jaundiced or pale.  Neurological:     General: No focal deficit present.     Mental Status: She is alert and oriented to person, place, and time.  Psychiatric:        Mood and Affect: Mood normal.        Behavior: Behavior normal.     (all labs ordered are listed, but only abnormal results are displayed) Labs Reviewed  COMPREHENSIVE METABOLIC PANEL WITH GFR - Abnormal; Notable for the following components:      Result Value   Chloride 96 (*)    Glucose, Bld 103 (*)    Creatinine, Ser 1.32 (*)    Calcium  8.7 (*)    GFR, Estimated 42 (*)    All other components within normal limits  CBC WITH DIFFERENTIAL/PLATELET - Abnormal; Notable for the following components:   RBC 3.77 (*)    Hemoglobin 11.8 (*)    Neutro Abs 8.0 (*)    All other components within normal limits  CBG MONITORING, ED - Abnormal; Notable for the following components:   Glucose-Capillary 106 (*)     All other components within normal limits  RESP PANEL BY RT-PCR (RSV, FLU A&B, COVID)  RVPGX2  CULTURE, BLOOD (ROUTINE X 2)  CULTURE, BLOOD (ROUTINE X 2)  MAGNESIUM   CK  LACTIC ACID, PLASMA  URINALYSIS, W/ REFLEX TO CULTURE (INFECTION SUSPECTED)  TROPONIN I (HIGH SENSITIVITY)  TROPONIN I (HIGH SENSITIVITY)    EKG: EKG Interpretation Date/Time:  Monday April 11 2024 17:54:11 EDT Ventricular Rate:  91 PR Interval:  143 QRS Duration:  72 QT Interval:  322 QTC Calculation: 397 R Axis:   39  Text Interpretation:  Sinus rhythm Repol abnrm suggests ischemia, lateral leads Confirmed by Melvenia Motto [phone]) on 04/11/2024 5:59:39 PM  Radiology: CT Angio Chest PE W and/or Wo Contrast Result Date: 04/11/2024 CLINICAL DATA:  PE suspected, chest and abdominal pain EXAM: CT ANGIOGRAPHY CHEST CT ABDOMEN AND PELVIS WITH CONTRAST TECHNIQUE: Multidetector CT imaging of the chest was performed using the standard protocol during bolus administration of intravenous contrast. Multiplanar CT image reconstructions and MIPs were obtained to evaluate the vascular anatomy. Multidetector CT imaging of the abdomen and pelvis was performed using the standard protocol during bolus administration of intravenous contrast. RADIATION DOSE REDUCTION: This exam was performed according to the departmental dose-optimization program which includes automated exposure control, adjustment of the mA and/or kV according to patient size and/or use of iterative reconstruction technique. CONTRAST:  75mL OMNIPAQUE  IOHEXOL  350 MG/ML SOLN COMPARISON:  CT chest angiogram, 10/22/2023 FINDINGS: CT CHEST ANGIOGRAM FINDINGS Cardiovascular: Satisfactory opacification of the pulmonary arteries to the segmental level. No evidence of pulmonary embolism. Normal heart size. Extensive three-vessel coronary artery calcifications. No pericardial effusion. Severe aortic atherosclerosis. Mediastinum/Nodes: Numerous enlarged mediastinal and bilateral hilar  lymph nodes, pretracheal nodes measuring up to 1.6 x 1.5 cm (series 8, image 66) thyroid  gland, trachea, and esophagus demonstrate no significant findings. Lungs/Pleura: Heterogeneous and consolidative airspace opacity in the right lung base with a small associated right pleural effusion (series 9, image 71). Unchanged mild bibasilar predominant pulmonary fibrosis. Minimal paraseptal emphysema. Musculoskeletal: No chest wall abnormality. No acute osseous findings. Review of the MIP images confirms the above findings. CT ABDOMEN PELVIS FINDINGS Hepatobiliary: No focal liver abnormality is seen. Status post cholecystectomy. Postoperative biliary dilatation. Pancreas: Unremarkable. No pancreatic ductal dilatation or surrounding inflammatory changes. Spleen: Normal in size without significant abnormality. Adrenals/Urinary Tract: Adrenal glands are unremarkable. Simple, benign bilateral renal cortical cysts for which no further follow-up or characterization is required. Kidneys are otherwise normal, without renal calculi, solid lesion, or hydronephrosis. Bladder is unremarkable. Stomach/Bowel: Stomach is within normal limits. Appendix not clearly visualized. No evidence of bowel wall thickening, distention, or inflammatory changes. Large burden of stool and stool balls throughout the colon and rectum Vascular/Lymphatic: Severe aortic atherosclerosis. No enlarged abdominal or pelvic lymph nodes. Reproductive: Hysterectomy. Other: No abdominal wall hernia or abnormality. No ascites. Musculoskeletal: No acute or significant osseous findings. IMPRESSION: 1. Negative examination for pulmonary embolism. 2. Heterogeneous and consolidative airspace opacity in the right lung base with a small associated right pleural effusion, consistent with infection or aspiration. 3. Numerous enlarged mediastinal and bilateral hilar lymph nodes, likely reactive. 4. 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 5. Large burden of stool and stool balls throughout the colon and rectum. 6. Coronary artery disease. Aortic Atherosclerosis (ICD10-I70.0) and Emphysema (ICD10-J43.9). Electronically Signed   By: Marolyn JONETTA Jaksch M.D.   On: 04/11/2024 21:49   CT ABDOMEN PELVIS W CONTRAST Result Date: 04/11/2024 CLINICAL DATA:  PE suspected, chest and abdominal pain EXAM: CT ANGIOGRAPHY CHEST CT ABDOMEN AND PELVIS WITH CONTRAST TECHNIQUE: Multidetector CT imaging of the chest was performed using the standard protocol during bolus administration of intravenous contrast. Multiplanar CT image reconstructions and MIPs were obtained to evaluate the vascular anatomy. Multidetector CT imaging of the abdomen and pelvis was performed using the standard protocol during bolus administration of intravenous contrast. RADIATION DOSE REDUCTION: This exam was performed according to the departmental dose-optimization program which includes automated exposure control, adjustment of the mA and/or kV according to patient size and/or use of  iterative reconstruction technique. CONTRAST:  75mL OMNIPAQUE  IOHEXOL  350 MG/ML SOLN COMPARISON:  CT chest angiogram, 10/22/2023 FINDINGS: CT CHEST ANGIOGRAM FINDINGS Cardiovascular: Satisfactory opacification of the pulmonary arteries to the segmental level. No evidence of pulmonary embolism. Normal heart size. Extensive three-vessel coronary artery calcifications. No pericardial effusion. Severe aortic atherosclerosis. Mediastinum/Nodes: Numerous enlarged mediastinal and bilateral hilar lymph nodes, pretracheal nodes measuring up to 1.6 x 1.5 cm (series 8, image 66) thyroid  gland, trachea, and esophagus demonstrate no significant findings. Lungs/Pleura: Heterogeneous and consolidative airspace opacity in the right lung base with a small associated right pleural effusion (series 9, image 71). Unchanged mild bibasilar predominant pulmonary fibrosis. Minimal paraseptal  emphysema. Musculoskeletal: No chest wall abnormality. No acute osseous findings. Review of the MIP images confirms the above findings. CT ABDOMEN PELVIS FINDINGS Hepatobiliary: No focal liver abnormality is seen. Status post cholecystectomy. Postoperative biliary dilatation. Pancreas: Unremarkable. No pancreatic ductal dilatation or surrounding inflammatory changes. Spleen: Normal in size without significant abnormality. Adrenals/Urinary Tract: Adrenal glands are unremarkable. Simple, benign bilateral renal cortical cysts for which no further follow-up or characterization is required. Kidneys are otherwise normal, without renal calculi, solid lesion, or hydronephrosis. Bladder is unremarkable. Stomach/Bowel: Stomach is within normal limits. Appendix not clearly visualized. No evidence of bowel wall thickening, distention, or inflammatory changes. Large burden of stool and stool balls throughout the colon and rectum Vascular/Lymphatic: Severe aortic atherosclerosis. No enlarged abdominal or pelvic lymph nodes. Reproductive: Hysterectomy. Other: No abdominal wall hernia or abnormality. No ascites. Musculoskeletal: No acute or significant osseous findings. IMPRESSION: 1. Negative examination for pulmonary embolism. 2. Heterogeneous and consolidative airspace opacity in the right lung base with a small associated right pleural effusion, consistent with infection or aspiration. 3. Numerous enlarged mediastinal and bilateral hilar lymph nodes, likely reactive. 4. 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 5. Large burden of stool and stool balls throughout the colon and rectum. 6. Coronary artery disease. Aortic Atherosclerosis (ICD10-I70.0) and Emphysema (ICD10-J43.9). Electronically Signed   By: Marolyn JONETTA Jaksch M.D.   On: 04/11/2024 21:49   CT Head Wo Contrast Result Date: 04/11/2024 CLINICAL DATA:  Head and neck pain, no known injury EXAM: CT HEAD  WITHOUT CONTRAST CT CERVICAL SPINE WITHOUT CONTRAST TECHNIQUE: Multidetector CT imaging of the head and cervical spine was performed following the standard protocol without intravenous contrast. Multiplanar CT image reconstructions of the cervical spine were also generated. RADIATION DOSE REDUCTION: This exam was performed according to the departmental dose-optimization program which includes automated exposure control, adjustment of the mA and/or kV according to patient size and/or use of iterative reconstruction technique. COMPARISON:  MR brain, 11/26/2023 FINDINGS: CT HEAD FINDINGS Brain: No evidence of acute infarction, hemorrhage, hydrocephalus, extra-axial collection or mass lesion/mass effect. Periventricular and deep white matter hypodensity. Vascular: No hyperdense vessel or unexpected calcification. Skull: Normal. Negative for fracture or focal lesion. Sinuses/Orbits: No acute finding. Other: None. CT CERVICAL SPINE FINDINGS Alignment: Postoperative straightening of the normal cervical lordosis. Skull base and vertebrae: No acute fracture. No primary bone lesion or focal pathologic process. Soft tissues and spinal canal: No prevertebral fluid or swelling. No visible canal hematoma. Disc levels: Anterior cervical discectomy and fusion of C4 through C7. Mild-to-moderate disc space height loss and osteophytosis of the remaining cervical levels. Upper chest: Negative. Other: None. IMPRESSION: 1. No acute intracranial pathology. 2. No fracture or static subluxation of the cervical spine. 3. Anterior cervical discectomy and fusion of C4 through C7. Mild-to-moderate disc space height loss and  osteophytosis of the remaining cervical levels. Electronically Signed   By: Marolyn JONETTA Jaksch M.D.   On: 04/11/2024 21:42   CT Cervical Spine Wo Contrast Result Date: 04/11/2024 CLINICAL DATA:  Head and neck pain, no known injury EXAM: CT HEAD WITHOUT CONTRAST CT CERVICAL SPINE WITHOUT CONTRAST TECHNIQUE: Multidetector CT  imaging of the head and cervical spine was performed following the standard protocol without intravenous contrast. Multiplanar CT image reconstructions of the cervical spine were also generated. RADIATION DOSE REDUCTION: This exam was performed according to the departmental dose-optimization program which includes automated exposure control, adjustment of the mA and/or kV according to patient size and/or use of iterative reconstruction technique. COMPARISON:  MR brain, 11/26/2023 FINDINGS: CT HEAD FINDINGS Brain: No evidence of acute infarction, hemorrhage, hydrocephalus, extra-axial collection or mass lesion/mass effect. Periventricular and deep white matter hypodensity. Vascular: No hyperdense vessel or unexpected calcification. Skull: Normal. Negative for fracture or focal lesion. Sinuses/Orbits: No acute finding. Other: None. CT CERVICAL SPINE FINDINGS Alignment: Postoperative straightening of the normal cervical lordosis. Skull base and vertebrae: No acute fracture. No primary bone lesion or focal pathologic process. Soft tissues and spinal canal: No prevertebral fluid or swelling. No visible canal hematoma. Disc levels: Anterior cervical discectomy and fusion of C4 through C7. Mild-to-moderate disc space height loss and osteophytosis of the remaining cervical levels. Upper chest: Negative. Other: None. IMPRESSION: 1. No acute intracranial pathology. 2. No fracture or static subluxation of the cervical spine. 3. Anterior cervical discectomy and fusion of C4 through C7. Mild-to-moderate disc space height loss and osteophytosis of the remaining cervical levels. Electronically Signed   By: Marolyn JONETTA Jaksch M.D.   On: 04/11/2024 21:42     Procedures   Medications Ordered in the ED  cefTRIAXone  (ROCEPHIN ) 1 g in sodium chloride  0.9 % 100 mL IVPB (has no administration in time range)  azithromycin (ZITHROMAX) 500 mg in sodium chloride  0.9 % 250 mL IVPB (has no administration in time range)  prochlorperazine   (COMPAZINE ) injection 5 mg (5 mg Intravenous Given 04/11/24 1831)  lactated ringers  bolus 1,000 mL (0 mLs Intravenous Stopped 04/11/24 1949)  acetaminophen  (TYLENOL ) tablet 650 mg (650 mg Oral Given 04/11/24 1832)  diphenhydrAMINE  (BENADRYL ) injection 12.5 mg (12.5 mg Intravenous Given 04/11/24 1832)  oxyCODONE -acetaminophen  (PERCOCET/ROXICET) 5-325 MG per tablet 1 tablet (1 tablet Oral Given 04/11/24 1833)  lactated ringers  bolus 500 mL (0 mLs Intravenous Stopped 04/11/24 2045)  iohexol  (OMNIPAQUE ) 350 MG/ML injection 75 mL (75 mLs Intravenous Contrast Given 04/11/24 2120)                                    Medical Decision Making Amount and/or Complexity of Data Reviewed Labs: ordered. Radiology: ordered.  Risk OTC drugs. Prescription drug management. Decision regarding hospitalization.   This patient presents to the ED for concern of diffuse pain, this involves an extensive number of treatment options, and is a complaint that carries with it a high risk of complications and morbidity.  The differential diagnosis includes infection, worsening of chronic pain, polypharmacy, myalgias, dehydration, metabolic derangements   Co morbidities / Chronic conditions that complicate the patient evaluation  HTN, arthritis, anxiety, depression, fibromyalgia, CAD, sleep apnea, carotid artery stenosis, pulmonary fibrosis, CHF, CKD   Additional history obtained:  Additional history obtained from EMR External records from outside source obtained and reviewed including EMS   Lab Tests:  I Ordered, and personally interpreted labs.  The pertinent results  include: Normal hemoglobin, no leukocytosis, baseline creatinine, normal electrolytes   Imaging Studies ordered:  I ordered imaging studies including CT of head, cervical spine, CT chest, CT of abdomen and pelvis I independently visualized and interpreted imaging which showed right lower lobe pneumonia and/or aspiration; large stool burden  throughout colon and rectum I agree with the radiologist interpretation   Cardiac Monitoring: / EKG:  The patient was maintained on a cardiac monitor.  I personally viewed and interpreted the cardiac monitored which showed an underlying rhythm of: Sinus rhythm   Problem List / ED Course / Critical interventions / Medication management  Patient presents for diffuse myalgias and headache.  On arrival in the ED, she is overall well-appearing.  On exam, she is warm to the touch but oral temperature 99.3 degrees at this time.  Current breathing is unlabored and lungs are clear to auscultation.  She does seem to have diffuse tenderness.  She describes a pleuritic chest and abdominal pain.  Multimodal pain control was ordered.  Workup was initiated.  On reassessment, patient's pain has improved, however, patient was found to be hypoxic and placed on 2 L of supplemental oxygen.  She does not wear oxygen at baseline.  Patient's lab work shows baseline hemoglobin, no leukocytosis, creatinine improved from baseline, normal electrolytes, normal troponin.  Given her hypoxia, imaging studies were ordered.  CTA chest shows a right lower lobe pneumonia/or aspiration.  Antibiotics were ordered.  Patient to be admitted for further management. I ordered medication including IV fluids for hydration, Compazine  and Benadryl  for headache, Tylenol  and Percocet for analgesia, ceftriaxone  and azithromycin for pneumonia Reevaluation of the patient after these medicines showed that the patient improved I have reviewed the patients home medicines and have made adjustments as needed  Social Determinants of Health:  Resides in nursing facility  CRITICAL CARE Performed by: Bernardino Fireman   Total critical care time: 32 minutes  Critical care time was exclusive of separately billable procedures and treating other patients.  Critical care was necessary to treat or prevent imminent or life-threatening deterioration.  Critical  care was time spent personally by me on the following activities: development of treatment plan with patient and/or surrogate as well as nursing, discussions with consultants, evaluation of patient's response to treatment, examination of patient, obtaining history from patient or surrogate, ordering and performing treatments and interventions, ordering and review of laboratory studies, ordering and review of radiographic studies, pulse oximetry and re-evaluation of patient's condition.   Final diagnoses:  Pneumonia of right lower lobe due to infectious organism  Acute respiratory failure with hypoxia [facility])    ED Discharge Orders     None          Fireman Bernardino, MD 04/11/24 2213    Fireman Bernardino, MD 04/11/24 2213

## 2024-04-11 NOTE — ED Triage Notes (Signed)
 Pt c/o head, neck, back and chest pain since 0400.

## 2024-04-12 DIAGNOSIS — J189 Pneumonia, unspecified organism: Secondary | ICD-10-CM

## 2024-04-12 DIAGNOSIS — J9601 Acute respiratory failure with hypoxia: Secondary | ICD-10-CM | POA: Diagnosis not present

## 2024-04-12 LAB — URINALYSIS, W/ REFLEX TO CULTURE (INFECTION SUSPECTED)
Bacteria, UA: NONE SEEN
Bilirubin Urine: NEGATIVE
Glucose, UA: >=500 mg/dL — AB
Hgb urine dipstick: NEGATIVE
Ketones, ur: NEGATIVE mg/dL
Leukocytes,Ua: NEGATIVE
Nitrite: NEGATIVE
Protein, ur: NEGATIVE mg/dL
Specific Gravity, Urine: 1.02 (ref 1.005–1.030)
pH: 7 (ref 5.0–8.0)

## 2024-04-12 LAB — CBC
HCT: 29.1 % — ABNORMAL LOW (ref 36.0–46.0)
Hemoglobin: 9.4 g/dL — ABNORMAL LOW (ref 12.0–15.0)
MCH: 32 pg (ref 26.0–34.0)
MCHC: 32.3 g/dL (ref 30.0–36.0)
MCV: 99 fL (ref 80.0–100.0)
Platelets: 162 K/uL (ref 150–400)
RBC: 2.94 MIL/uL — ABNORMAL LOW (ref 3.87–5.11)
RDW: 13.9 % (ref 11.5–15.5)
WBC: 6.5 K/uL (ref 4.0–10.5)
nRBC: 0 % (ref 0.0–0.2)

## 2024-04-12 LAB — BASIC METABOLIC PANEL WITH GFR
Anion gap: 11 (ref 5–15)
BUN: 20 mg/dL (ref 8–23)
CO2: 25 mmol/L (ref 22–32)
Calcium: 8.4 mg/dL — ABNORMAL LOW (ref 8.9–10.3)
Chloride: 102 mmol/L (ref 98–111)
Creatinine, Ser: 1.29 mg/dL — ABNORMAL HIGH (ref 0.44–1.00)
GFR, Estimated: 43 mL/min — ABNORMAL LOW (ref >60)
Glucose, Bld: 87 mg/dL (ref 70–99)
Potassium: 3.4 mmol/L — ABNORMAL LOW (ref 3.5–5.1)
Sodium: 138 mmol/L (ref 135–145)

## 2024-04-12 LAB — RESPIRATORY PANEL BY PCR

## 2024-04-12 LAB — MAGNESIUM: Magnesium: 2.1 mg/dL (ref 1.7–2.4)

## 2024-04-12 LAB — MRSA NEXT GEN BY PCR, NASAL: MRSA by PCR Next Gen: NOT DETECTED

## 2024-04-12 LAB — PHOSPHORUS: Phosphorus: 4.4 mg/dL (ref 2.5–4.6)

## 2024-04-12 MED ORDER — AZITHROMYCIN 250 MG PO TABS
500.0000 mg | ORAL_TABLET | Freq: Every day | ORAL | Status: AC
  Administered 2024-04-12 – 2024-04-13 (×2): 500 mg via ORAL
  Filled 2024-04-12 (×2): qty 2

## 2024-04-12 MED ORDER — POLYETHYLENE GLYCOL 3350 17 G PO PACK: 17.0000 g | PACK | Freq: Every day | ORAL | Status: DC | PRN

## 2024-04-12 MED ORDER — POTASSIUM CHLORIDE CRYS ER 20 MEQ PO TBCR
40.0000 meq | EXTENDED_RELEASE_TABLET | Freq: Once | ORAL | Status: AC
  Administered 2024-04-12: 40 meq via ORAL
  Filled 2024-04-12: qty 2

## 2024-04-12 MED ORDER — MELATONIN 3 MG PO TABS: 6.0000 mg | ORAL_TABLET | Freq: Every evening | ORAL | Status: DC | PRN

## 2024-04-12 MED ORDER — ROSUVASTATIN CALCIUM 20 MG PO TABS
40.0000 mg | ORAL_TABLET | Freq: Every day | ORAL | Status: DC
  Administered 2024-04-12 – 2024-04-18 (×7): 40 mg via ORAL
  Filled 2024-04-12 (×7): qty 2

## 2024-04-12 MED ORDER — LIDOCAINE 5 % EX PTCH
2.0000 | MEDICATED_PATCH | Freq: Every day | CUTANEOUS | Status: DC | PRN
  Administered 2024-04-14: 2 via TRANSDERMAL
  Filled 2024-04-12: qty 2

## 2024-04-12 MED ORDER — GUAIFENESIN ER 600 MG PO TB12
600.0000 mg | ORAL_TABLET | Freq: Two times a day (BID) | ORAL | Status: DC
Start: 2024-04-12 — End: 2024-04-18
  Administered 2024-04-12 – 2024-04-18 (×13): 600 mg via ORAL
  Filled 2024-04-12 (×13): qty 1

## 2024-04-12 MED ORDER — HEPARIN SODIUM (PORCINE) 5000 UNIT/ML IJ SOLN
5000.0000 [IU] | Freq: Three times a day (TID) | INTRAMUSCULAR | Status: DC
Start: 2024-04-12 — End: 2024-04-18
  Administered 2024-04-12 – 2024-04-18 (×19): 5000 [IU] via SUBCUTANEOUS
  Filled 2024-04-12 (×19): qty 1

## 2024-04-12 MED ORDER — OXYCODONE HCL 5 MG PO TABS: 5.0000 mg | ORAL_TABLET | Freq: Four times a day (QID) | ORAL | Status: DC | PRN

## 2024-04-12 MED ORDER — METHOCARBAMOL 500 MG PO TABS
1000.0000 mg | ORAL_TABLET | Freq: Four times a day (QID) | ORAL | Status: DC | PRN
  Administered 2024-04-12 – 2024-04-18 (×7): 1000 mg via ORAL
  Filled 2024-04-12 (×8): qty 2

## 2024-04-12 MED ORDER — SODIUM CHLORIDE 0.9% FLUSH
3.0000 mL | Freq: Two times a day (BID) | INTRAVENOUS | Status: DC
  Administered 2024-04-12 – 2024-04-18 (×11): 3 mL via INTRAVENOUS

## 2024-04-12 MED ORDER — ASPIRIN 81 MG PO TBEC
81.0000 mg | DELAYED_RELEASE_TABLET | Freq: Every day | ORAL | Status: DC
  Administered 2024-04-12 – 2024-04-18 (×7): 81 mg via ORAL
  Filled 2024-04-12 (×7): qty 1

## 2024-04-12 MED ORDER — POLYETHYLENE GLYCOL 3350 17 G PO PACK
17.0000 g | PACK | Freq: Two times a day (BID) | ORAL | Status: DC
  Administered 2024-04-12 – 2024-04-15 (×7): 17 g via ORAL
  Filled 2024-04-12 (×12): qty 1

## 2024-04-12 MED ORDER — ONDANSETRON HCL 4 MG/2ML IJ SOLN
4.0000 mg | Freq: Four times a day (QID) | INTRAMUSCULAR | Status: DC | PRN
  Administered 2024-04-12 – 2024-04-18 (×7): 4 mg via INTRAVENOUS
  Filled 2024-04-12 (×7): qty 2

## 2024-04-12 MED ORDER — SENNA 8.6 MG PO TABS
2.0000 | ORAL_TABLET | Freq: Every day | ORAL | Status: DC
  Administered 2024-04-12: 17.2 mg via ORAL
  Filled 2024-04-12: qty 2

## 2024-04-12 MED ORDER — BISACODYL 10 MG RE SUPP: 10.0000 mg | Freq: Every day | RECTAL | Status: DC | PRN

## 2024-04-12 MED ORDER — LEVOTHYROXINE SODIUM 25 MCG PO TABS
25.0000 ug | ORAL_TABLET | Freq: Every day | ORAL | Status: DC
  Administered 2024-04-12 – 2024-04-18 (×7): 25 ug via ORAL
  Filled 2024-04-12 (×7): qty 1

## 2024-04-12 MED ORDER — ALBUTEROL SULFATE (2.5 MG/3ML) 0.083% IN NEBU
2.5000 mg | INHALATION_SOLUTION | RESPIRATORY_TRACT | Status: DC | PRN
  Administered 2024-04-12: 2.5 mg via RESPIRATORY_TRACT

## 2024-04-12 MED ORDER — ALBUTEROL SULFATE (2.5 MG/3ML) 0.083% IN NEBU
2.5000 mg | INHALATION_SOLUTION | RESPIRATORY_TRACT | Status: DC
  Administered 2024-04-12: 2.5 mg via RESPIRATORY_TRACT

## 2024-04-12 MED ORDER — CLOPIDOGREL BISULFATE 75 MG PO TABS
75.0000 mg | ORAL_TABLET | Freq: Every day | ORAL | Status: DC
  Administered 2024-04-12 – 2024-04-18 (×7): 75 mg via ORAL
  Filled 2024-04-12 (×7): qty 1

## 2024-04-12 MED ORDER — MINERAL OIL RE ENEM: 1.0000 | ENEMA | Freq: Once | RECTAL | Status: DC

## 2024-04-12 MED ORDER — SODIUM CHLORIDE 0.9 % IV BOLUS
1000.0000 mL | Freq: Once | INTRAVENOUS | Status: AC
Start: 2024-04-12 — End: 2024-04-12
  Administered 2024-04-12: 1000 mL via INTRAVENOUS

## 2024-04-12 MED ORDER — AMPICILLIN-SULBACTAM SODIUM 3 (2-1) G IJ SOLR
3.0000 g | Freq: Four times a day (QID) | INTRAMUSCULAR | Status: DC
  Administered 2024-04-12 – 2024-04-15 (×11): 3 g via INTRAVENOUS
  Filled 2024-04-12 (×11): qty 8

## 2024-04-12 MED ORDER — OXYCODONE HCL 5 MG PO TABS: 2.5000 mg | ORAL_TABLET | Freq: Four times a day (QID) | ORAL | Status: DC | PRN

## 2024-04-12 MED ORDER — ALBUTEROL SULFATE (2.5 MG/3ML) 0.083% IN NEBU
2.5000 mg | INHALATION_SOLUTION | Freq: Three times a day (TID) | RESPIRATORY_TRACT | Status: DC
  Administered 2024-04-12 – 2024-04-14 (×5): 2.5 mg via RESPIRATORY_TRACT
  Filled 2024-04-12 (×6): qty 3

## 2024-04-12 MED ORDER — MIDODRINE HCL 5 MG PO TABS
5.0000 mg | ORAL_TABLET | Freq: Three times a day (TID) | ORAL | Status: DC
Start: 2024-04-12 — End: 2024-04-17
  Administered 2024-04-12 – 2024-04-17 (×16): 5 mg via ORAL
  Filled 2024-04-12 (×18): qty 1

## 2024-04-12 MED ORDER — TRAZODONE HCL 50 MG PO TABS
100.0000 mg | ORAL_TABLET | Freq: Every day | ORAL | Status: DC
  Administered 2024-04-12 – 2024-04-17 (×7): 100 mg via ORAL
  Filled 2024-04-12 (×7): qty 2

## 2024-04-12 MED ORDER — ACETAMINOPHEN 500 MG PO TABS
1000.0000 mg | ORAL_TABLET | Freq: Four times a day (QID) | ORAL | Status: DC | PRN
  Administered 2024-04-12 – 2024-04-18 (×6): 1000 mg via ORAL
  Filled 2024-04-12 (×6): qty 2

## 2024-04-12 MED ORDER — PANTOPRAZOLE SODIUM 40 MG PO TBEC
40.0000 mg | DELAYED_RELEASE_TABLET | Freq: Every day | ORAL | Status: DC
  Administered 2024-04-12 – 2024-04-18 (×7): 40 mg via ORAL
  Filled 2024-04-12 (×7): qty 1

## 2024-04-12 MED ORDER — LACTATED RINGERS IV SOLN: INTRAVENOUS | Status: AC

## 2024-04-12 MED ORDER — ALPRAZOLAM 0.5 MG PO TABS
0.5000 mg | ORAL_TABLET | Freq: Once | ORAL | Status: AC
  Administered 2024-04-12: 0.5 mg via ORAL
  Filled 2024-04-12: qty 1

## 2024-04-12 NOTE — Progress Notes (Signed)
 Mobility Specialist Progress Note:    04/12/24 0850  Mobility  Activity Ambulated with assistance  Level of Assistance Contact guard assist, steadying assist  Assistive Device None  Distance Ambulated (ft) 20 ft  Range of Motion/Exercises Active;All extremities  Activity Response Tolerated well  Mobility Referral Yes  Mobility visit 1 Mobility  Mobility Specialist Start Time (ACUTE ONLY) 0850  Mobility Specialist Stop Time (ACUTE ONLY) 0910  Mobility Specialist Time Calculation (min) (ACUTE ONLY) 20 min   Pt received in bed, requesting assistance to bathroom. Required CGA to stand and ambulate with no AD. Tolerated well, dizziness once sitting from being supine. Returned supine, all needs met.  Chaquana Nichols Mobility Specialist Please contact via Special educational needs teacher or  Rehab office at [phone]

## 2024-04-12 NOTE — H&P (Signed)
 History and Physical    [name] FMW:[id number] DOB: 20-Sep-1950 DOA: 04/11/2024  PCP: [name], MD   Patient coming from: Home   Chief Complaint:     HPI: Hx limited by somnolence.  [name] is a 73 y.o. female with hx of CAD, carotid artery disease with severe RICA stenosis s/p R-CEA (12/'24), renal artery stenosis, IPF, CKD stage IIIb, hx HTN although more recent chronic/orthostatic hypotension on midodrine , HLD, hypothyroidism, rheumatoid arthritis, GERD, fibromyalgia, who presented with acute onset of chest tightness and pleuritic chest pain. Reports onset yesterday morning upon awakening. Other than this she cannot really tell me much, frequently falling asleep. Does acknowledge constipation. Unclear if having dysphagia. + diffuse myalgias.    Review of Systems:  ROS complete and negative except as marked above   Allergies  Allergen Reactions   Celecoxib Itching   Citalopram Other (See Comments) and Palpitations    Heart rate increased   Ciprofloxacin Hcl Other (See Comments)    Mya have made her sick or bad diarrhea   Dicyclomine  Diarrhea and Other (See Comments)    Made diarrhea worse   Lamotrigine Diarrhea   Metoclopramide Other (See Comments)    Dizziness   Milnacipran Hcl Other (See Comments)    Unknown   Omeprazole Other (See Comments)   Pregabalin Other (See Comments)   Serotonin Reuptake Inhibitors (Ssris) Other (See Comments)   Simvastatin  Other (See Comments)    Increased back and muscle pain   Tape Other (See Comments)    Skin gets red and skin pulls off, paper tape only   Tizanidine Other (See Comments)    Unknown    Trazodone  And Nefazodone Other (See Comments)    Hallucinations    Venlafaxine Other (See Comments)    Affected eyes, blurred vision   Codeine Itching   Nefazodone Other (See Comments)   Sertraline  Hcl Other (See Comments)    Stomach upset. Pt takes this med at home 11/2022    Prior to Admission medications    Medication Sig Start Date End Date Taking? Authorizing Provider  acetaminophen  (TYLENOL ) 325 MG tablet Take 325 mg by mouth every 6 (six) hours as needed for mild pain (pain score 1-3). 11/11/23   [provider]  albuterol  (VENTOLIN  HFA) 108 (90 Base) MCG/ACT inhaler Inhale 1 puff into the lungs 4 (four) times daily as needed for wheezing or shortness of breath. 04/22/21   [provider]  aspirin  81 MG EC tablet Take 81 mg by mouth daily.    [provider]  clopidogrel  (PLAVIX ) 75 MG tablet Take 1 tablet (75 mg total) by mouth daily. 12/25/22   Debera Jayson MATSU, MD  cyanocobalamin  (VITAMIN B12) 1000 MCG tablet Take 1 tablet (1,000 mcg total) by mouth daily. 10/26/23   Arrien, Mauricio Daniel, MD  dapagliflozin  propanediol (FARXIGA ) 10 MG TABS tablet Take 1 tablet (10 mg total) by mouth daily. 10/27/23   Arrien, Mauricio Daniel, MD  EPINEPHrine 0.3 mg/0.3 mL IJ SOAJ injection Inject 0.3 mg into the muscle as needed for anaphylaxis. 07/13/23   [provider]  levothyroxine  (SYNTHROID ) 25 MCG tablet Take 25 mcg by mouth daily before breakfast. 02/16/20   [provider]  methocarbamol (ROBAXIN) 500 MG tablet Take 1,000 mg by mouth every 6 (six) hours as needed (pain). 11/23/23   [provider]  midodrine  (PROAMATINE ) 5 MG tablet Take 1 tablet (5 mg total) by mouth 3 (three) times daily with meals. 11/27/23   Tat,  Alm, MD  nitroGLYCERIN  (NITROSTAT ) 0.4 MG SL tablet Place 1 tablet (0.4 mg total) under the tongue every 5 (five) minutes x 3 doses as needed for chest pain (if no relief after 2nd dose, proceed to ED or call 911). 06/08/23   Debera Jayson MATSU, MD  ondansetron  (ZOFRAN ) 4 MG tablet Take 4 mg by mouth every 8 (eight) hours as needed for nausea or vomiting. 06/03/21   [provider]  Allie Gutta Calcium  500 MG TABS Take 1 tablet by mouth 2 (two) times daily. 09/22/23   [provider]  pantoprazole  (PROTONIX ) 40 MG tablet  Take 40 mg by mouth daily. 10/08/22   [provider]  rosuvastatin  (CRESTOR ) 40 MG tablet Take 1 tablet (40 mg total) by mouth daily. 10/26/23   Arrien, Elidia Sieving, MD  torsemide  (DEMADEX ) 20 MG tablet Take 1 tablet (20 mg total) by mouth daily. 11/28/23   Evonnie Alm, MD  Vitamin D , Ergocalciferol , (DRISDOL ) 50000 UNITS CAPS Take 50,000 Units by mouth every Monday.     [provider]    Past Medical History:  Diagnosis Date   Abnormal chest CT    Anxiety    Arthritis    Rhumatoid and Osteoarthritis   Carotid artery disease    Dr. Serene, s/p left CEA, severe R ICA stenosis   Coronary atherosclerosis of native coronary artery    DES RCA/circumflex 4/10, LVEF 65 -> repeat cath 11/2021 with multivessel CAD including LM stenosis, initial med rx   Depression    Essential hypertension    Fibromyalgia    GERD (gastroesophageal reflux disease)    Hiatal hernia    Hyperlipidemia    Kidney cysts    NSTEMI (non-ST elevated myocardial infarction) (HCC)    4/10   Renal artery stenosis    Left renal artery stent 12/13   Restless leg syndrome    Sleep apnea    Spondylosis without myelopathy    Tobacco abuse     Past Surgical History:  Procedure Laterality Date   ABDOMINAL HYSTERECTOMY  07/15/1975   BREAST LUMPECTOMY     CAROTID ENDARTERECTOMY Left 02/28/2009   CHOLECYSTECTOMY  07/14/1989   COLONOSCOPY     COLONOSCOPY N/A 05/08/2017   Procedure: COLONOSCOPY;  Surgeon: Golda Claudis PENNER, MD;  Location: [facility];  Service: Endoscopy;  Laterality: N/A;  12:00   CORONARY ATHERECTOMY N/A 11/19/2022   Procedure: CORONARY ATHERECTOMY;  Surgeon: Darron Deatrice LABOR, MD;  Location: [facility];  Service: Cardiovascular;  Laterality: N/A;   CORONARY STENT INTERVENTION N/A 11/19/2022   Procedure: CORONARY STENT INTERVENTION;  Surgeon: Darron Deatrice LABOR, MD;  Location: [facility];  Service: Cardiovascular;  Laterality: N/A;   CORONARY ULTRASOUND/IVUS N/A  11/19/2022   Procedure: Coronary Ultrasound/IVUS;  Surgeon: Darron Deatrice LABOR, MD;  Location: [facility];  Service: Cardiovascular;  Laterality: N/A;   ENDARTERECTOMY Right 07/03/2023   Procedure: ENDARTERECTOMY CAROTID WITH XENOSURE BIOLOGIC PATCH ANGIOPLASTY;  Surgeon: Serene Gaile ORN, MD;  Location: [facility];  Service: Vascular;  Laterality: Right;   ESOPHAGOGASTRODUODENOSCOPY N/A 04/07/2013   Procedure: ESOPHAGOGASTRODUODENOSCOPY (EGD);  Surgeon: Claudis PENNER Golda, MD;  Location: [facility];  Service: Endoscopy;  Laterality: N/A;  250   ESOPHAGOGASTRODUODENOSCOPY N/A 02/02/2014   Procedure: ESOPHAGOGASTRODUODENOSCOPY (EGD);  Surgeon: Claudis PENNER Golda, MD;  Location: [facility];  Service: Endoscopy;  Laterality: N/A;  120   EYE SURGERY     Catartact bilateral   INSERTION OF RETROGRADE CAROTID STENT Right  07/03/2023   Procedure: Right common carotid angiogram;  Surgeon: Serene Gaile ORN, MD;  Location: [facility] OR;  Service: Vascular;  Laterality: Right;   JOINT REPLACEMENT  09/12/2011   Left knee   JOINT REPLACEMENT  07/14/2004   Left Knee   kidney stent     Left December 2013   LEFT HEART CATH AND CORONARY ANGIOGRAPHY N/A 11/13/2021   Procedure: LEFT HEART CATH AND CORONARY ANGIOGRAPHY;  Surgeon: Darron Deatrice LABOR, MD;  [facility];  Service: Cardiovascular;  Laterality: N/A;   LEFT HEART CATH AND CORONARY ANGIOGRAPHY N/A 11/18/2022   Procedure: LEFT HEART CATH AND CORONARY ANGIOGRAPHY;  Surgeon: Anner Alm ORN, MD;  Location: [facility] INVASIVE CV LAB;  Service: Cardiovascular;  Laterality: N/A;   LUMBAR DISC SURGERY     L5-S1   MULTIPLE TOOTH EXTRACTIONS  07/14/2013   Neck Fusion  07/14/1994   C4 to C6   PERCUTANEOUS STENT INTERVENTION Left 06/15/2012   Procedure: PERCUTANEOUS STENT INTERVENTION;  Surgeon: Gaile ORN Serene, MD;  Location: [facility] CATH LAB;  Service: Cardiovascular;  Laterality: Left;  renal   PERIPHERAL VASCULAR BALLOON ANGIOPLASTY  05/26/2017    Procedure: PERIPHERAL VASCULAR BALLOON ANGIOPLASTY;  Surgeon: Serene Gaile ORN, MD;  [facility];  Service: Cardiovascular;;  Lt. Renal Angioplasty   POLYPECTOMY  05/08/2017   Procedure: POLYPECTOMY;  Surgeon: Golda Claudis PENNER, MD;  Location: [facility];  Service: Endoscopy;;  colon    RENAL ANGIOGRAM N/A 06/15/2012   Procedure: RENAL ANGIOGRAM;  Surgeon: Gaile ORN Serene, MD;  Location: [facility] CATH LAB;  Service: Cardiovascular;  Laterality: N/A;   RENAL ANGIOGRAPHY  05/26/2017   Procedure: RENAL ANGIOGRAPHY;  Surgeon: Serene Gaile ORN, MD;  [facility]: [facility];  Service: Cardiovascular;;   SPINE SURGERY  07/14/1992   Ruptured disc   TEMPORARY PACEMAKER N/A 11/19/2022   Procedure: TEMPORARY PACEMAKER;  Surgeon: Darron Deatrice LABOR, MD;  Location: [facility];  Service: Cardiovascular;  Laterality: N/A;   TONSILECTOMY, ADENOIDECTOMY, BILATERAL MYRINGOTOMY AND TUBES  07/15/1967   TOTAL KNEE ARTHROPLASTY     Left   TUBAL LIGATION       reports that she quit smoking about 2 years ago. Her smoking use included cigarettes. She started smoking about 47 years ago. She has never used smokeless tobacco. She reports that she does not drink alcohol  and does not use drugs.  Family History  Problem Relation Age of Onset   Other Mother        Cerebral hemorrage   Heart disease Mother    Heart attack Mother    Hyperlipidemia Mother    Heart disease Father    Heart attack Father    Hyperlipidemia Father    Heart disease Brother        Heart Disease before age 61   Diabetes Brother    Hypertension Brother    Heart attack Brother    Cancer Sister    Arthritis Sister    Heart disease Sister    Heart disease Daughter 46       Heart Disease before age 47   Heart attack Daughter    Hypertension Daughter    Cancer Sister    Arthritis Sister    Diabetes Brother        Varicose  Veins   Peripheral vascular disease Brother    Diabetes Brother      Physical  Exam: Vitals:   04/11/24 2330 04/12/24 0000 04/12/24 0007 04/12/24 0021  BP: (!) 100/58   107/70  Pulse: 71 90 93 83  Resp: (!) 21 18 15 16   Temp: 99 F (37.2 C)   98.4 F (36.9 C)  TempSrc: Oral   Oral  SpO2: 93% 94% (!) 84% 97%  Weight:    69.8 kg  Height:    4' 11 (1.499 m)    Gen:  Somnolent , NAD   CV: Regular, normal S1, S2, no murmurs  Resp: Normal WOB, bibasilar rales, diminished R base  Abd: Flat, hypoactive, nontender MSK: Symmetric, no edema  Skin: No rashes or lesions to exposed skin  Neuro: Somnolent, arouses easily but falls back asleep quickly  Psych: euthymic, appropriate    Data review:   Labs reviewed, notable for:   Cr 1.32 b/l  HS trop neg  Lactate 0.7  WBC 9  Hb 11   Micro:  Results for orders placed or performed during the hospital encounter of 04/11/24  Resp panel by RT-PCR (RSV, Flu A&B, Covid) Anterior Nasal Swab     Status: None   Collection Time: 04/11/24  6:25 PM   Specimen: Anterior Nasal Swab  Result Value Ref Range Status   SARS Coronavirus 2 by RT PCR NEGATIVE NEGATIVE Final    Comment: (NOTE) SARS-CoV-2 target nucleic acids are NOT DETECTED.  The SARS-CoV-2 RNA is generally detectable in upper respiratory specimens during the acute phase of infection. The lowest concentration of SARS-CoV-2 viral copies this assay can detect is 138 copies/mL. A negative result does not preclude SARS-Cov-2 infection and should not be used as the sole basis for treatment or other patient management decisions. A negative result may occur with  improper specimen collection/handling, submission of specimen other than nasopharyngeal swab, presence of viral mutation(s) within the areas targeted by this assay, and inadequate number of viral copies(<138 copies/mL). A negative result must be combined with clinical observations, patient history, and epidemiological information. The expected result is Negative.  Fact Sheet for Patients:   BloggerCourse.com  Fact Sheet for Healthcare Providers:  SeriousBroker.it  This test is no t yet approved or cleared by the [place]  FDA and  has been authorized for detection and/or diagnosis of SARS-CoV-2 by FDA under an Emergency Use Authorization (EUA). This EUA will remain  in effect (meaning this test can be used) for the duration of the COVID-19 declaration under Section 564(b)(1) of the Act, 21 U.S.C.section 360bbb-3(b)(1), unless the authorization is terminated  or revoked sooner.       Influenza A by PCR NEGATIVE NEGATIVE Final   Influenza B by PCR NEGATIVE NEGATIVE Final    Comment: (NOTE) The Xpert Xpress SARS-CoV-2/FLU/RSV plus assay is intended as an aid in the diagnosis of influenza from Nasopharyngeal swab specimens and should not be used as a sole basis for treatment. Nasal washings and aspirates are unacceptable for Xpert Xpress SARS-CoV-2/FLU/RSV testing.  Fact Sheet for Patients: BloggerCourse.com  Fact Sheet for Healthcare Providers: SeriousBroker.it  This test is not yet approved or cleared by the [place]  FDA and has been authorized for detection and/or diagnosis of SARS-CoV-2 by FDA under an Emergency Use Authorization (EUA). This EUA will remain in effect (meaning this test can be used) for the duration of the COVID-19 declaration under Section 564(b)(1) of the Act, 21 U.S.C. section 360bbb-3(b)(1), unless the authorization is terminated or revoked.     Resp Syncytial Virus by PCR NEGATIVE NEGATIVE Final    Comment: (NOTE) Fact Sheet for Patients: BloggerCourse.com  Fact Sheet for Healthcare Providers: SeriousBroker.it  This test is not yet approved or cleared by the [place]  FDA and has been authorized for detection and/or diagnosis of SARS-CoV-2 by FDA under an Emergency Use  Authorization (EUA). This EUA will remain in effect (meaning this test can be used) for the duration of the COVID-19 declaration under Section 564(b)(1) of the Act, 21 U.S.C. section 360bbb-3(b)(1), unless the authorization is terminated or revoked.  Performed at [facility], [address] [place], [place] [address]   Blood Culture (routine x 2)     Status: None (Preliminary result)   Collection Time: 04/11/24  8:15 PM   Specimen: Left Antecubital; Blood  Result Value Ref Range Status   Specimen Description LEFT ANTECUBITAL  Final   Special Requests   Final    BOTTLES DRAWN AEROBIC AND ANAEROBIC Blood Culture adequate volume Performed at [facility], [address] [place], [place] [address]    Culture PENDING  Incomplete   Report Status PENDING  Incomplete    Imaging reviewed:  CT Angio Chest PE W and/or Wo Contrast Result Date: 04/11/2024 CLINICAL DATA:  PE suspected, chest and abdominal pain EXAM: CT ANGIOGRAPHY CHEST CT ABDOMEN AND PELVIS WITH CONTRAST TECHNIQUE: Multidetector CT imaging of the chest was performed using the standard protocol during bolus administration of intravenous contrast. Multiplanar CT image reconstructions and MIPs were obtained to evaluate the vascular anatomy. Multidetector CT imaging of the abdomen and pelvis was performed using the standard protocol during bolus administration of intravenous contrast. RADIATION DOSE REDUCTION: This exam was performed according to the departmental dose-optimization program which includes automated exposure control, adjustment of the mA and/or kV according to patient size and/or use of iterative reconstruction technique. CONTRAST:  75mL OMNIPAQUE  IOHEXOL  350 MG/ML SOLN COMPARISON:  CT chest angiogram, 10/22/2023 FINDINGS: CT CHEST ANGIOGRAM FINDINGS Cardiovascular: Satisfactory opacification of the pulmonary arteries to the segmental level. No evidence of pulmonary embolism. Normal heart size. Extensive three-vessel coronary  artery calcifications. No pericardial effusion. Severe aortic atherosclerosis. Mediastinum/Nodes: Numerous enlarged mediastinal and bilateral hilar lymph nodes, pretracheal nodes measuring up to 1.6 x 1.5 cm (series 8, image 66) thyroid  gland, trachea, and esophagus demonstrate no significant findings. Lungs/Pleura: Heterogeneous and consolidative airspace opacity in the right lung base with a small associated right pleural effusion (series 9, image 71). Unchanged mild bibasilar predominant pulmonary fibrosis. Minimal paraseptal emphysema. Musculoskeletal: No chest wall abnormality. No acute osseous findings. Review of the MIP images confirms the above findings. CT ABDOMEN PELVIS FINDINGS Hepatobiliary: No focal liver abnormality is seen. Status post cholecystectomy. Postoperative biliary dilatation. Pancreas: Unremarkable. No pancreatic ductal dilatation or surrounding inflammatory changes. Spleen: Normal in size without significant abnormality. Adrenals/Urinary Tract: Adrenal glands are unremarkable. Simple, benign bilateral renal cortical cysts for which no further follow-up or characterization is required. Kidneys are otherwise normal, without renal calculi, solid lesion, or hydronephrosis. Bladder is unremarkable. Stomach/Bowel: Stomach is within normal limits. Appendix not clearly visualized. No evidence of bowel wall thickening, distention, or inflammatory changes. Large burden of stool and stool balls throughout the colon and rectum Vascular/Lymphatic: Severe aortic atherosclerosis. No enlarged abdominal or pelvic lymph nodes. Reproductive: Hysterectomy. Other: No abdominal wall hernia or abnormality. No ascites. Musculoskeletal: No acute or significant osseous findings. IMPRESSION: 1. Negative examination for pulmonary embolism. 2. Heterogeneous and consolidative airspace opacity in the right lung base with a small associated right pleural effusion, consistent with infection or aspiration. 3. Numerous  enlarged mediastinal and bilateral hilar lymph nodes, likely reactive. 4. 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 5. Large burden of stool and stool balls throughout the colon and rectum. 6. Coronary artery disease. Aortic Atherosclerosis (ICD10-I70.0) and Emphysema (ICD10-J43.9). Electronically Signed   By: Marolyn JONETTA Jaksch M.D.   On: 04/11/2024 21:49   CT ABDOMEN PELVIS W CONTRAST Result Date: 04/11/2024 CLINICAL DATA:  PE suspected, chest and abdominal pain EXAM: CT ANGIOGRAPHY CHEST CT ABDOMEN AND PELVIS WITH CONTRAST TECHNIQUE: Multidetector CT imaging of the chest was performed using the standard protocol during bolus administration of intravenous contrast. Multiplanar CT image reconstructions and MIPs were obtained to evaluate the vascular anatomy. Multidetector CT imaging of the abdomen and pelvis was performed using the standard protocol during bolus administration of intravenous contrast. RADIATION DOSE REDUCTION: This exam was performed according to the departmental dose-optimization program which includes automated exposure control, adjustment of the mA and/or kV according to patient size and/or use of iterative reconstruction technique. CONTRAST:  75mL OMNIPAQUE  IOHEXOL  350 MG/ML SOLN COMPARISON:  CT chest angiogram, 10/22/2023 FINDINGS: CT CHEST ANGIOGRAM FINDINGS Cardiovascular: Satisfactory opacification of the pulmonary arteries to the segmental level. No evidence of pulmonary embolism. Normal heart size. Extensive three-vessel coronary artery calcifications. No pericardial effusion. Severe aortic atherosclerosis. Mediastinum/Nodes: Numerous enlarged mediastinal and bilateral hilar lymph nodes, pretracheal nodes measuring up to 1.6 x 1.5 cm (series 8, image 66) thyroid  gland, trachea, and esophagus demonstrate no significant findings. Lungs/Pleura: Heterogeneous and consolidative airspace opacity in the right lung base with a small  associated right pleural effusion (series 9, image 71). Unchanged mild bibasilar predominant pulmonary fibrosis. Minimal paraseptal emphysema. Musculoskeletal: No chest wall abnormality. No acute osseous findings. Review of the MIP images confirms the above findings. CT ABDOMEN PELVIS FINDINGS Hepatobiliary: No focal liver abnormality is seen. Status post cholecystectomy. Postoperative biliary dilatation. Pancreas: Unremarkable. No pancreatic ductal dilatation or surrounding inflammatory changes. Spleen: Normal in size without significant abnormality. Adrenals/Urinary Tract: Adrenal glands are unremarkable. Simple, benign bilateral renal cortical cysts for which no further follow-up or characterization is required. Kidneys are otherwise normal, without renal calculi, solid lesion, or hydronephrosis. Bladder is unremarkable. Stomach/Bowel: Stomach is within normal limits. Appendix not clearly visualized. No evidence of bowel wall thickening, distention, or inflammatory changes. Large burden of stool and stool balls throughout the colon and rectum Vascular/Lymphatic: Severe aortic atherosclerosis. No enlarged abdominal or pelvic lymph nodes. Reproductive: Hysterectomy. Other: No abdominal wall hernia or abnormality. No ascites. Musculoskeletal: No acute or significant osseous findings. IMPRESSION: 1. Negative examination for pulmonary embolism. 2. Heterogeneous and consolidative airspace opacity in the right lung base with a small associated right pleural effusion, consistent with infection or aspiration. 3. Numerous enlarged mediastinal and bilateral hilar lymph nodes, likely reactive. 4. 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 5. Large burden of stool and stool balls throughout the colon and rectum. 6. Coronary artery disease. Aortic Atherosclerosis (ICD10-I70.0) and Emphysema (ICD10-J43.9). Electronically Signed   By: Marolyn JONETTA Jaksch M.D.   On:  04/11/2024 21:49   CT Head Wo Contrast Result Date: 04/11/2024 CLINICAL DATA:  Head and neck pain, no known injury EXAM: CT HEAD WITHOUT CONTRAST CT CERVICAL SPINE WITHOUT CONTRAST TECHNIQUE: Multidetector CT imaging of the head and cervical spine was performed following the standard protocol without intravenous contrast. Multiplanar CT image reconstructions of the cervical spine were also generated. RADIATION DOSE REDUCTION: This exam was performed according to the departmental dose-optimization program which includes automated exposure control, adjustment of the mA and/or kV according to patient size and/or use of  iterative reconstruction technique. COMPARISON:  MR brain, 11/26/2023 FINDINGS: CT HEAD FINDINGS Brain: No evidence of acute infarction, hemorrhage, hydrocephalus, extra-axial collection or mass lesion/mass effect. Periventricular and deep white matter hypodensity. Vascular: No hyperdense vessel or unexpected calcification. Skull: Normal. Negative for fracture or focal lesion. Sinuses/Orbits: No acute finding. Other: None. CT CERVICAL SPINE FINDINGS Alignment: Postoperative straightening of the normal cervical lordosis. Skull base and vertebrae: No acute fracture. No primary bone lesion or focal pathologic process. Soft tissues and spinal canal: No prevertebral fluid or swelling. No visible canal hematoma. Disc levels: Anterior cervical discectomy and fusion of C4 through C7. Mild-to-moderate disc space height loss and osteophytosis of the remaining cervical levels. Upper chest: Negative. Other: None. IMPRESSION: 1. No acute intracranial pathology. 2. No fracture or static subluxation of the cervical spine. 3. Anterior cervical discectomy and fusion of C4 through C7. Mild-to-moderate disc space height loss and osteophytosis of the remaining cervical levels. Electronically Signed   By: Marolyn JONETTA Jaksch M.D.   On: 04/11/2024 21:42   CT Cervical Spine Wo Contrast Result Date: 04/11/2024 CLINICAL DATA:   Head and neck pain, no known injury EXAM: CT HEAD WITHOUT CONTRAST CT CERVICAL SPINE WITHOUT CONTRAST TECHNIQUE: Multidetector CT imaging of the head and cervical spine was performed following the standard protocol without intravenous contrast. Multiplanar CT image reconstructions of the cervical spine were also generated. RADIATION DOSE REDUCTION: This exam was performed according to the departmental dose-optimization program which includes automated exposure control, adjustment of the mA and/or kV according to patient size and/or use of iterative reconstruction technique. COMPARISON:  MR brain, 11/26/2023 FINDINGS: CT HEAD FINDINGS Brain: No evidence of acute infarction, hemorrhage, hydrocephalus, extra-axial collection or mass lesion/mass effect. Periventricular and deep white matter hypodensity. Vascular: No hyperdense vessel or unexpected calcification. Skull: Normal. Negative for fracture or focal lesion. Sinuses/Orbits: No acute finding. Other: None. CT CERVICAL SPINE FINDINGS Alignment: Postoperative straightening of the normal cervical lordosis. Skull base and vertebrae: No acute fracture. No primary bone lesion or focal pathologic process. Soft tissues and spinal canal: No prevertebral fluid or swelling. No visible canal hematoma. Disc levels: Anterior cervical discectomy and fusion of C4 through C7. Mild-to-moderate disc space height loss and osteophytosis of the remaining cervical levels. Upper chest: Negative. Other: None. IMPRESSION: 1. No acute intracranial pathology. 2. No fracture or static subluxation of the cervical spine. 3. Anterior cervical discectomy and fusion of C4 through C7. Mild-to-moderate disc space height loss and osteophytosis of the remaining cervical levels. Electronically Signed   By: Marolyn JONETTA Jaksch M.D.   On: 04/11/2024 21:42    EKG:  Personally reviewed, SR, ST dep and TWI laterally.    ED Course:  Treated with 1L LR, Ceftriaxone , Azithromycin, tylenol , benadryl  IV,  compazine , percocet, and Fentanyl     Assessment/Plan:  73 y.o. female with hx CAD, carotid artery disease with severe RICA stenosis s/p R-CEA (12/'24), renal artery stenosis, IPF, CKD stage IIIb, hx HTN although more recent chronic/orthostatic hypotension on midodrine , HLD, hypothyroidism, rheumatoid arthritis, GERD, fibromyalgia, who presented with diffuse pain, and found to have RLL pneumonia with associated effusion and AHRF requiring 2L O2.   Community acquired pneumonia  Small R parapneumonic effusion  Hypotension - hypovolemic / distributive, improved  Acute onset of chest tightness, pleuritic pain, SOB. Noted to have intermittent hypotension, low of 70/30s, improved with IVF, tachypneic in low 20s, desat requiring 2L O2. CTA PE neg for PE, demonstrates RLL pneumonia with small pleural effusion. Question of aspiration.  -- Switch Abx to  Unasyn , and continue Azithromycin  -- Swallow eval, SLP eval, dysphagia 3 for now  -- s/p 1L IVF, continue mIVF at 100 / hr  -- Check sputum Cx, RVP  -- Albuterol  q4 while awake, IS, OOB to chair   Constipation  -- Bowel regimen, enema to start, suppository prn   Incidental findings:  Mediastinal / hilar LAD, likely reactive.   Chronic medical problems:  CAD: Continue home aspirin , Plavix , rosuvastatin . carotid artery disease: with severe RICA stenosis s/p R-CEA (12/'24).  See CAD above Renal artery stenosis: See CAD above IPF: ?? Unknown if seeing pulmonology. Imaging with Basilar predominant pulmonary fibrosis, probable UIP pattern. Needs to establish with pulmonology OP  CKD stage IIIb: Cr at recent best, 1.3.  Chronic hypotension/orthostatic hypotension: Continue home midodrine  5 mg 3 times daily HLD: See CAD Hypothyroidism: Continue home levothyroxine  rheumatoid arthritis: Noted not on disease modifying treatment GERD: Continue home PPI Fibromyalgia: Noted , Tylenol , methocarbamol as needed    Body mass index is 31.08 kg/m. Obesity  class I    DVT prophylaxis:  SQ Heparin  Code Status:  Full Code   Diet:  Diet Orders (From admission, onward)    None      Family Communication:  None   Consults:  None   Admission status:   Inpatient, Telemetry bed  Severity of Illness: The appropriate patient status for this patient is INPATIENT. Inpatient status is judged to be reasonable and necessary in order to provide the required intensity of service to ensure the patient's safety. The patient's presenting symptoms, physical exam findings, and initial radiographic and laboratory data in the context of their chronic comorbidities is felt to place them at high risk for further clinical deterioration. Furthermore, it is not anticipated that the patient will be medically stable for discharge from the hospital within 2 midnights of admission.   * I certify that at the point of admission it is my clinical judgment that the patient will require inpatient hospital care spanning beyond 2 midnights from the point of admission due to high intensity of service, high risk for further deterioration and high frequency of surveillance required.*   Dorn Dawson, MD [facility]  How to contact the [facility] Attending or Consulting provider 7A - 7P or covering provider during after hours 7P -7A, for this patient.  Check the care team in [facility] and look for a) attending/consulting [facility] provider listed and b) the [facility] team listed Log into [email or web address] and use Jack's universal password to access. If you do not have the password, please contact the hospital operator. Locate the [facility] provider you are looking for under [facility] and page to a number that you can be directly reached. If you still have difficulty reaching the provider, please page the [facility] (Director on Call) for the Hospitalists listed on amion for assistance.  04/12/2024, 2:50 AM

## 2024-04-12 NOTE — Evaluation (Signed)
 Clinical/Bedside Swallow Evaluation Patient Details  Name: [name] MRN: [id number] Date of Birth: 1951-01-05  Today's Date: 04/12/2024 Time: SLP Start Time (ACUTE ONLY): 1040 SLP Stop Time (ACUTE ONLY): 1054 SLP Time Calculation (min) (ACUTE ONLY): 14 min  Past Medical History:  Past Medical History:  Diagnosis Date   Abnormal chest CT    Anxiety    Arthritis    Rhumatoid and Osteoarthritis   Carotid artery disease    Dr. Serene, s/p left CEA, severe R ICA stenosis   Coronary atherosclerosis of native coronary artery    DES RCA/circumflex 4/10, LVEF 65 -> repeat cath 11/2021 with multivessel CAD including LM stenosis, initial med rx   Depression    Essential hypertension    Fibromyalgia    GERD (gastroesophageal reflux disease)    Hiatal hernia    Hyperlipidemia    Kidney cysts    NSTEMI (non-ST elevated myocardial infarction) (HCC)    4/10   Renal artery stenosis    Left renal artery stent 12/13   Restless leg syndrome    Sleep apnea    Spondylosis without myelopathy    Tobacco abuse    Past Surgical History:  Past Surgical History:  Procedure Laterality Date   ABDOMINAL HYSTERECTOMY  07/15/1975   BREAST LUMPECTOMY     CAROTID ENDARTERECTOMY Left 02/28/2009   CHOLECYSTECTOMY  07/14/1989   COLONOSCOPY     COLONOSCOPY N/A 05/08/2017   Procedure: COLONOSCOPY;  Surgeon: Golda Claudis PENNER, MD;  [facility];  Service: Endoscopy;  Laterality: N/A;  12:00   CORONARY ATHERECTOMY N/A 11/19/2022   Procedure: CORONARY ATHERECTOMY;  Surgeon: Darron Deatrice LABOR, MD;  [facility]: [facility] INVASIVE CV LAB;  Service: Cardiovascular;  Laterality: N/A;   CORONARY STENT INTERVENTION N/A 11/19/2022   Procedure: CORONARY STENT INTERVENTION;  Surgeon: Darron Deatrice LABOR, MD;  [facility];  Service: Cardiovascular;  Laterality: N/A;   CORONARY ULTRASOUND/IVUS N/A 11/19/2022   Procedure: Coronary Ultrasound/IVUS;  Surgeon: Darron Deatrice LABOR, MD;  [facility]  INVASIVE CV LAB;  Service: Cardiovascular;  Laterality: N/A;   ENDARTERECTOMY Right 07/03/2023   Procedure: ENDARTERECTOMY CAROTID WITH XENOSURE BIOLOGIC PATCH ANGIOPLASTY;  Surgeon: Serene Gaile ORN, MD;  [facility]: [facility] OR;  Service: Vascular;  Laterality: Right;   ESOPHAGOGASTRODUODENOSCOPY N/A 04/07/2013   Procedure: ESOPHAGOGASTRODUODENOSCOPY (EGD);  Surgeon: Claudis PENNER Golda, MD;  [facility];  Service: Endoscopy;  Laterality: N/A;  250   ESOPHAGOGASTRODUODENOSCOPY N/A 02/02/2014   Procedure: ESOPHAGOGASTRODUODENOSCOPY (EGD);  Surgeon: Claudis PENNER Golda, MD;  [facility];  Service: Endoscopy;  Laterality: N/A;  120   EYE SURGERY     Catartact bilateral   INSERTION OF RETROGRADE CAROTID STENT Right 07/03/2023   Procedure: Right common carotid angiogram;  Surgeon: Serene Gaile ORN, MD;  Location: [facility] OR;  Service: Vascular;  Laterality: Right;   JOINT REPLACEMENT  09/12/2011   Left knee   JOINT REPLACEMENT  07/14/2004   Left Knee   kidney stent     Left December 2013   LEFT HEART CATH AND CORONARY ANGIOGRAPHY N/A 11/13/2021   Procedure: LEFT HEART CATH AND CORONARY ANGIOGRAPHY;  Surgeon: Darron Deatrice LABOR, MD;  [facility];  Service: Cardiovascular;  Laterality: N/A;   LEFT HEART CATH AND CORONARY ANGIOGRAPHY N/A 11/18/2022   Procedure: LEFT HEART CATH AND CORONARY ANGIOGRAPHY;  Surgeon: Anner Alm ORN, MD;  Location: [facility] INVASIVE CV LAB;  Service: Cardiovascular;  Laterality: N/A;   LUMBAR DISC SURGERY  L5-S1   MULTIPLE TOOTH EXTRACTIONS  07/14/2013   Neck Fusion  07/14/1994   C4 to C6   PERCUTANEOUS STENT INTERVENTION Left 06/15/2012   Procedure: PERCUTANEOUS STENT INTERVENTION;  Surgeon: Gaile LELON New, MD;  Location: [facility] CATH LAB;  Service: Cardiovascular;  Laterality: Left;  renal   PERIPHERAL VASCULAR BALLOON ANGIOPLASTY  05/26/2017   Procedure: PERIPHERAL VASCULAR BALLOON ANGIOPLASTY;  Surgeon: New Gaile LELON, MD;  [facility];  Service: Cardiovascular;;  Lt. Renal Angioplasty   POLYPECTOMY  05/08/2017   Procedure: POLYPECTOMY;  Surgeon: Golda Claudis PENNER, MD;  [facility];  Service: Endoscopy;;  colon    RENAL ANGIOGRAM N/A 06/15/2012   Procedure: RENAL ANGIOGRAM;  Surgeon: Gaile LELON New, MD;  Location: [facility] CATH LAB;  Service: Cardiovascular;  Laterality: N/A;   RENAL ANGIOGRAPHY  05/26/2017   Procedure: RENAL ANGIOGRAPHY;  Surgeon: New Gaile LELON, MD;  [facility];  Service: Cardiovascular;;   SPINE SURGERY  07/14/1992   Ruptured disc   TEMPORARY PACEMAKER N/A 11/19/2022   Procedure: TEMPORARY PACEMAKER;  Surgeon: Darron Deatrice LABOR, MD;  [facility];  Service: Cardiovascular;  Laterality: N/A;   TONSILECTOMY, ADENOIDECTOMY, BILATERAL MYRINGOTOMY AND TUBES  07/15/1967   TOTAL KNEE ARTHROPLASTY     Left   TUBAL LIGATION     HPI:  [name] is a 73 y.o. female with hx of CAD, carotid artery disease with severe RICA stenosis s/p R-CEA (12/'24), renal artery stenosis, IPF, CKD stage IIIb, hx HTN although more recent chronic/orthostatic hypotension on midodrine , HLD, hypothyroidism, rheumatoid arthritis, GERD, fibromyalgia, who presented to ED on 04/11/24 with acute onset of chest tightness and pleuritic chest pain.  Pt is a resident at [facility]. Unclear if having dysphagia per chart review. ST consulted for clinical swallow evaluation.    Assessment / Plan / Recommendation  Clinical Impression  Pt seen for brief clinical swallow evaluation with regular/thin liquids assessed in minimal amounts without evidence of oropharyngeal dysphagia.  Pt reports burning in throat impacting swallow occasionally, but is currently on medication for GERD per report.  Pt stated she oftentimes feels full when eating/drinking which may indicate potential esophageal dysphagia.  No overt s/s of aspiration present during trial with adequate mastication, timely swallow and  pharyngeal clearance.  Vocal quality minimally hypophonic, but she has been experiencing SOB d/t recent PNA dx.  Recommend following esophageal precautions and continuing Regular/thin liquid diet.  No further ST needs indicated at this time.  Thank you for this consult. SLP Visit Diagnosis: Dysphagia, unspecified (R13.10)    Aspiration Risk  Mild aspiration risk    Diet Recommendation   Thin;Age appropriate regular  Medication Administration: Whole meds with liquid (one at a time)    Other  Recommendations Oral Care Recommendations: Oral care BID     Assistance Recommended at Discharge  TBD  Functional Status Assessment Patient has had a recent decline in their functional status and demonstrates the ability to make significant improvements in function in a reasonable and predictable amount of time.  Frequency and Duration  (evaluation only)          Prognosis Prognosis for improved oropharyngeal function: Good      Swallow Study   General Date of Onset: 04/11/24 HPI: MANEH SIEBEN is a 73 y.o. female with hx of CAD, carotid artery disease with severe RICA stenosis s/p R-CEA (12/'24), renal artery stenosis, IPF, CKD stage IIIb, hx HTN although more recent  chronic/orthostatic hypotension on midodrine , HLD, hypothyroidism, rheumatoid arthritis, GERD, fibromyalgia, who presented with acute onset of chest tightness and pleuritic chest pain. Reports onset yesterday morning upon awakening. Other than this she cannot really tell me much, frequently falling asleep. Does acknowledge constipation. Unclear if having dysphagia.ST consulted for clinical swallow evaluation. Type of Study: Bedside Swallow Evaluation Previous Swallow Assessment: n/a Diet Prior to this Study: Regular;Thin liquids (Level 0) Temperature Spikes Noted: No Respiratory Status: Nasal cannula History of Recent Intubation: No Behavior/Cognition: Alert;Cooperative;Distractible Oral Cavity Assessment: Within Functional  Limits Oral Care Completed by SLP: Recent completion by staff Oral Cavity - Dentition: Dentures, top;Dentures, bottom Vision: Functional for self-feeding Self-Feeding Abilities: Able to feed self Patient Positioning: Upright in chair Baseline Vocal Quality: Low vocal intensity Volitional Cough: Strong Volitional Swallow: Able to elicit    Oral/Motor/Sensory Function Overall Oral Motor/Sensory Function: Within functional limits   Ice Chips Ice chips: Not tested   Thin Liquid Thin Liquid: Within functional limits Presentation: Cup    Nectar Thick Nectar Thick Liquid: Not tested   Honey Thick Honey Thick Liquid: Not tested   Puree Puree: Within functional limits Presentation: Spoon   Solid     Solid: Within functional limits Presentation: Self Fed      Pat Jalysa Swopes,M.S.,CCC-SLP 04/12/2024,10:54 AM

## 2024-04-12 NOTE — Plan of Care (Signed)

## 2024-04-12 NOTE — TOC Initial Note (Signed)
 Transition of Care [facility]) - Initial/Assessment Note    Patient Details  Name: [name] MRN: [id number] Date of Birth: [birth year]  [facility]) CM/SW Contact:    Sharlyne Stabs, RN Phone Number: 04/12/2024, 11:50 AM  Clinical Narrative:     Patient admitted               Expected Discharge Plan: Home w Home Health Services Barriers to Discharge: Continued Medical Work up   Patient Goals and CMS Choice Patient states their goals for this hospitalization and ongoing recovery are:: Return to ALF CMS Medicare.gov Compare Post Acute Care list provided to:: Patient Choice offered to / list presented to : Patient [place] ownership interest in [facility].provided to:: Patient    Expected Discharge Plan and Services     Post Acute Care Choice: Home Health                 Prior Living Arrangements/Services   Lives with:: Facility Resident Patient language and need for interpreter reviewed:: Yes Do you feel safe going back to the place where you live?: Yes      Need for Family Participation in Patient Care: Yes (Comment) Care giver support system in place?: Yes (comment) Current home services: DME Criminal Activity/Legal Involvement Pertinent to Current Situation/Hospitalization: No - Comment as needed  Activities of Daily Living      Permission Sought/Granted            Permission granted to share info w Relationship: Daughter     Emotional Assessment     Affect (typically observed): Accepting Orientation: : Oriented to Self, Oriented to Place, Oriented to  Time, Oriented to Situation Alcohol  / Substance Use: Not Applicable Psych Involvement: No (comment)  Admission diagnosis:  CAP (community acquired pneumonia) [J18.9] Acute respiratory failure with hypoxia (HCC) [J96.01] Pneumonia of right lower lobe due to infectious organism [J18.9] Patient Active Problem List   Diagnosis Date Noted   CAP (community acquired pneumonia)  04/11/2024   Acute renal failure superimposed on stage 3b chronic kidney disease, unspecified acute renal failure type (HCC) 11/25/2023   Acute kidney injury superimposed on CKD 11/24/2023   Chronic diastolic HF (heart failure) (HCC) 10/23/2023   Acute hypoxemic respiratory failure (HCC) 10/20/2023   Status post carotid endarterectomy 07/03/2023   Symptomatic carotid artery stenosis, right 07/03/2023   Anxiety state 06/10/2023   Relational problem 06/10/2023   Acute coronary syndrome with high troponin (HCC) 11/20/2022   Chronic kidney disease stage IIIb 11/18/2022   Obesity (BMI 30-39.9) 11/18/2022   Rheumatoid arthritis (HCC) 11/17/2022   Fibromyalgia 11/17/2022   Hypothyroidism 11/17/2022   Progressive angina with severe left main stenosis 11/16/2022   IPF (idiopathic pulmonary fibrosis) (HCC) 02/25/2022   Abdominal pain, epigastric 11/26/2021   Nausea without vomiting 11/26/2021   Constipation 11/26/2021   Tobacco abuse 11/16/2021   Abnormal chest CT 11/16/2021   Abnormal nuclear cardiac imaging test    Mixed incontinence 07/16/2021   History of UTI 07/16/2021   AMS (altered mental status) 03/11/2020   AKI (acute kidney injury) 03/11/2020   Hypokalemia 03/11/2020   Altered mental status 03/11/2020   Depression    Prolonged QT interval    Unilateral primary osteoarthritis, right knee 02/22/2020   Gastroesophageal reflux disease without esophagitis 01/25/2018   Encounter for colonoscopy due to history of adenomatous colonic polyps 03/23/2017   Melena 01/31/2014   Aftercare following surgery of the circulatory system, NEC 01/30/2014   Tooth pain 05/03/2013  GERD (gastroesophageal reflux disease) 02/16/2013   Bloating 02/16/2013   Renal artery stenosis 11/04/2012   PAD (peripheral artery disease) 05/07/2012   Shortness of breath 04/14/2012   Carotid artery stenosis 11/13/2009   Hyperlipidemia 04/19/2009   Essential hypertension 03/12/2009   CAD (coronary artery disease)  03/12/2009   PCP:  [name], MD Pharmacy:   [facility] [place], [place] - [address] [place] [address] Phone: [phone] Fax: [phone]  [facility] [phone] - [place], [place] - [address] AT [facility] [address] [place] [address] Phone: [phone] Fax: [phone]  [facility] [address] [place] [address] Phone: [phone] Fax: [phone]  [facility] [place], [place] - [address] [place] [address] Phone: [phone] Fax: [phone]     Social Drivers of Health (SDOH) Social History: SDOH Screenings   Food Insecurity: No Food Insecurity (04/12/2024)  Housing: Unknown (04/12/2024)  Transportation Needs: No Transportation Needs (04/12/2024)  Utilities: Not At Risk (04/12/2024)  Depression (PHQ2-9): Low Risk  (03/11/2020)  Financial Resource Strain: Medium Risk (07/31/2023)   Received from [facility]  Social Connections: Unknown (04/12/2024)  Tobacco Use: Medium Risk (04/11/2024)  Health Literacy: High Risk (07/31/2023)   Received from [facility]   SDOH Interventions:     Readmission Risk Interventions    04/12/2024   11:48 AM 11/27/2023    7:51 AM 10/20/2023    8:13 PM  Readmission Risk Prevention Plan  Transportation Screening Complete Complete Complete  PCP or Specialist Appt within 3-5 Days Not Complete  Complete  HRI or Home Care Consult Complete  Complete  Social Work Consult for Recovery Care Planning/Counseling Complete  Complete  Palliative Care Screening Not Applicable  Not Applicable  Medication Review Oceanographer) Complete Complete Complete  HRI or Home Care Consult  Complete   SW Recovery Care/Counseling Consult  Complete   Palliative Care Screening  Not Applicable   Skilled Nursing Facility  Not Applicable

## 2024-04-12 NOTE — Hospital Course (Addendum)
 73 y/o female with a history of CAD, carotid artery disease with severe RICA stenosis s/p R-CEA (12//20/2024), IPF, HTN, renal artery stenosis, GERD, fibromyalgia, rheumatoid arthritis, hypothyroidism, CKD stage IIIb, HFpEFand HLD presenting diffuse pain body particularly headache, neck pain, chest pain, abdominal pain.  She also complained of some shortness of breath.  The patient resides at [facility].  She states that she was recently given a course of antibiotics, Augmentin for which she felt to be pneumonia. She denied any nausea, vomiting or diarrhea, abdominal pain.  She has nonproductive cough. The patient has had recent hospitalizations from 10/20/2023 to 10/26/2023 for acute on chronic HFpEF. She was discharged to SNF with torsemide  40 mg daily.  She was subsequently admitted from 11/24/2023 to 11/27/2023 secondary to acute on chronic renal failure, CKD stage IIIb as well as vertigo and dizziness.  It was felt that her vertigo and dizziness was multifactorial including orthostatic hypotension, volume depletion, BPPV.  She was discharged on midodrine .  In the ED, the patient was hypoxic in the 80s.  She was tachypneic.  He was hemodynamically stable.  WBC 9.8, hemoglobin 11.8, plates 837.  CTA chest was negative for PE but showed consolidative opacity in the right lower lobe with a small right effusion.  There was shotty mediastinal lymphadenopathy.  CT of the abdomen and pelvis showed abundant stool, but there was no other acute findings.  CT of the brain was negative for acute findings.  CT of the cervical spine was negative for any fracture or dislocation.  The patient was started on ceftriaxone  and azithromycin.

## 2024-04-12 NOTE — TOC Initial Note (Signed)
 Transition of Care [facility]) - Initial/Assessment Note    Patient Details  Name: [name] MRN: [id number] Date of Birth: [birth year]  Transition of Care [facility]) CM/SW Contact:    Sharlyne Stabs, RN Phone Number: 04/12/2024, 2:40 PM  Clinical Narrative:       Patient admitted with pneumonia. CM at the bedside to assess. Patient is alert and oriented, experiencing nausea, we called RN for medication. Patient did well with PT and wants to return to [facility] when stable. She uses a cane when her knee pain is worse, some days walks independently. She states she is currently get PT at [facility]. MD aware to place new orders. TOC following.    Expected Discharge Plan: Home w Home Health Services Barriers to Discharge: Continued Medical Work up   Patient Goals and CMS Choice Patient states their goals for this hospitalization and ongoing recovery are:: Return to ALF CMS Medicare.gov Compare Post Acute Care list provided to:: Patient Choice offered to / list presented to : Patient [place] ownership interest in [facility].provided to:: Patient    Expected Discharge Plan and Services     Post Acute Care Choice: Home Health            Prior Living Arrangements/Services   Lives with:: Facility Resident Patient language and need for interpreter reviewed:: Yes Do you feel safe going back to the place where you live?: Yes      Need for Family Participation in Patient Care: Yes (Comment) Care giver support system in place?: Yes (comment) Current home services: DME Criminal Activity/Legal Involvement Pertinent to Current Situation/Hospitalization: No - Comment as needed  Activities of Daily Living     Permission Sought/Granted        Permission granted to share info w Relationship: Daughter    Emotional Assessment     Affect (typically observed): Accepting Orientation: : Oriented to Self, Oriented to Place, Oriented to  Time, Oriented to Situation Alcohol  /  Substance Use: Not Applicable Psych Involvement: No (comment)  Admission diagnosis:  CAP (community acquired pneumonia) [J18.9] Acute respiratory failure with hypoxia (HCC) [J96.01] Pneumonia of right lower lobe due to infectious organism [J18.9] Patient Active Problem List   Diagnosis Date Noted   CAP (community acquired pneumonia) 04/11/2024   Acute renal failure superimposed on stage 3b chronic kidney disease, unspecified acute renal failure type (HCC) 11/25/2023   Acute kidney injury superimposed on CKD 11/24/2023   Chronic diastolic HF (heart failure) (HCC) 10/23/2023   Acute hypoxemic respiratory failure (HCC) 10/20/2023   Status post carotid endarterectomy 07/03/2023   Symptomatic carotid artery stenosis, right 07/03/2023   Anxiety state 06/10/2023   Relational problem 06/10/2023   Acute coronary syndrome with high troponin (HCC) 11/20/2022   Chronic kidney disease stage IIIb 11/18/2022   Obesity (BMI 30-39.9) 11/18/2022   Rheumatoid arthritis (HCC) 11/17/2022   Fibromyalgia 11/17/2022   Hypothyroidism 11/17/2022   Progressive angina with severe left main stenosis 11/16/2022   IPF (idiopathic pulmonary fibrosis) (HCC) 02/25/2022   Abdominal pain, epigastric 11/26/2021   Nausea without vomiting 11/26/2021   Constipation 11/26/2021   Tobacco abuse 11/16/2021   Abnormal chest CT 11/16/2021   Abnormal nuclear cardiac imaging test    Mixed incontinence 07/16/2021   History of UTI 07/16/2021   AMS (altered mental status) 03/11/2020   AKI (acute kidney injury) 03/11/2020   Hypokalemia 03/11/2020   Altered mental status 03/11/2020   Depression    Prolonged QT interval    Unilateral primary  osteoarthritis, right knee 02/22/2020   Gastroesophageal reflux disease without esophagitis 01/25/2018   Encounter for colonoscopy due to history of adenomatous colonic polyps 03/23/2017   Melena 01/31/2014   Aftercare following surgery of the circulatory system, NEC 01/30/2014   Tooth  pain 05/03/2013   GERD (gastroesophageal reflux disease) 02/16/2013   Bloating 02/16/2013   Renal artery stenosis 11/04/2012   PAD (peripheral artery disease) 05/07/2012   Shortness of breath 04/14/2012   Carotid artery stenosis 11/13/2009   Hyperlipidemia 04/19/2009   Essential hypertension 03/12/2009   CAD (coronary artery disease) 03/12/2009   PCP:  [name], MD Pharmacy:   [facility] [place], [place] - [address] [place] [address] Phone: [phone] Fax: [phone]  [facility] [phone] - [place], [place] - [address] AT [facility] [address] [place] [address] Phone: [phone] Fax: [phone]  [facility] [address] [place] [address] Phone: [phone] Fax: [phone]  [facility] [place], [place] - [address] [place] [address] Phone: [phone] Fax: [phone]   Social Drivers of Health (SDOH) Social History: SDOH Screenings   Food Insecurity: No Food Insecurity (04/12/2024)  Housing: Unknown (04/12/2024)  Transportation Needs: No Transportation Needs (04/12/2024)  Utilities: Not At Risk (04/12/2024)  Depression (PHQ2-9): Low Risk  (03/11/2020)  Financial Resource Strain: Medium Risk (07/31/2023)   [facility] from [facility]  Social Connections: Unknown (04/12/2024)  Tobacco Use: Medium Risk (04/11/2024)  Health Literacy: High Risk (07/31/2023)   Received from [facility]   SDOH Interventions:    Readmission Risk Interventions    04/12/2024   11:48 AM 11/27/2023    7:51 AM 10/20/2023    8:13 PM  Readmission Risk Prevention Plan  Transportation Screening Complete Complete Complete  PCP or Specialist Appt within 3-5 Days Not Complete  Complete  HRI or Home Care Consult Complete  Complete  Social Work Consult for Recovery Care Planning/Counseling Complete  Complete  Palliative Care Screening  Not Applicable  Not Applicable  Medication Review Oceanographer) Complete Complete Complete  HRI or Home Care Consult  Complete   SW Recovery Care/Counseling Consult  Complete   Palliative Care Screening  Not Applicable   Skilled Nursing Facility  Not Applicable

## 2024-04-12 NOTE — Evaluation (Signed)
 Physical Therapy Evaluation Patient Details Name: [name] MRN: [id number] DOB: [birth year] Today's Date: 04/12/2024  History of Present Illness  Hx limited by somnolence.   [name] is a 73 y.o. female with hx of CAD, carotid artery disease with severe RICA stenosis s/p R-CEA (12/'24), renal artery stenosis, IPF, CKD stage IIIb, hx HTN although more recent chronic/orthostatic hypotension on midodrine , HLD, hypothyroidism, rheumatoid arthritis, GERD, fibromyalgia, who presented with acute onset of chest tightness and pleuritic chest pain. Reports onset yesterday morning upon awakening. Other than this she cannot really tell me much, frequently falling asleep. Does acknowledge constipation. Unclear if having dysphagia. + diffuse myalgias.   Clinical Impression  Pt was agreeable to completing today's PT evaluation. Her SpO2 was monitored throughout today's evaluation as when O2 was removed with sitting EOB she desaturated to 85-88%, but she was able to remain 94%+ when on 2L O2. She was able to ambulate with a SPC and supervision for 80 feet prior to sitting due to fatigue. She was modified independent with all bed mobility. She was left in the chair with the call bell within reach and LPN notified. Patient will benefit from continued skilled physical therapy in hospital and recommended venue below to increase strength, balance, endurance for safe ADLs and gait.       If plan is discharge home, recommend the following: A little help with walking and/or transfers;A little help with bathing/dressing/bathroom;Assistance with cooking/housework;Assist for transportation;Help with stairs or ramp for entrance   Can travel by private vehicle        Equipment Recommendations None recommended by PT  Recommendations for Other Services       Functional Status Assessment Patient has not had a recent decline in their functional status     Precautions / Restrictions Precautions Precautions:  Fall Recall of Precautions/Restrictions: Intact Restrictions Weight Bearing Restrictions Per Provider Order: No      Mobility  Bed Mobility Overal bed mobility: Modified Independent             General bed mobility comments: slow and labored movement    Transfers Overall transfer level: Needs assistance Equipment used: Straight cane Transfers: Sit to/from Stand, Bed to chair/wheelchair/BSC Sit to Stand: Supervision   Step pivot transfers: Supervision            Ambulation/Gait Ambulation/Gait assistance: Supervision Gait Distance (Feet): 80 Feet Assistive device: Straight cane Gait Pattern/deviations: Step-through pattern, Decreased stride length Gait velocity: decreased     General Gait Details: required 2L O2 throughout ambulation  Stairs            Wheelchair Mobility     Tilt Bed    Modified Rankin (Stroke Patients Only)       Balance Overall balance assessment: Mild deficits observed, not formally tested                                           Pertinent Vitals/Pain Pain Assessment Pain Assessment: 0-10 Pain Score: 9  Pain Location: ribs Pain Descriptors / Indicators: Sharp Pain Intervention(s): Limited activity within patient's tolerance, Monitored during session, Repositioned    Home Living Family/patient expects to be discharged to:: Assisted living                 Home Equipment: Agricultural consultant (2 wheels);Wheelchair - manual;Cane - single point Additional Comments: pt has all needs at ALF  Prior Function Prior Level of Function : Needs assist       Physical Assist : ADLs (physical)   ADLs (physical): IADLs Mobility Comments: household ambulation using SPC ADLs Comments: Indpendent with household ADLs     Extremity/Trunk Assessment   Upper Extremity Assessment Upper Extremity Assessment: Overall WFL for tasks assessed    Lower Extremity Assessment Lower Extremity Assessment: Generalized  weakness    Cervical / Trunk Assessment Cervical / Trunk Assessment: Normal  Communication   Communication Communication: No apparent difficulties    Cognition Arousal: Alert Behavior During Therapy: WFL for tasks assessed/performed   PT - Cognitive impairments: No apparent impairments                         Following commands: Intact       Cueing Cueing Techniques: Verbal cues     General Comments      Exercises     Assessment/Plan    PT Assessment Patient needs continued PT services  PT Problem List Decreased strength;Decreased activity tolerance;Decreased balance;Decreased mobility       PT Treatment Interventions DME instruction;Gait training;Stair training;Functional mobility training;Therapeutic activities;Therapeutic exercise;Balance training;Patient/family education    PT Goals (Current goals can be found in the Care Plan section)  Acute Rehab PT Goals Patient Stated Goal: return to her ALF with HHPT PT Goal Formulation: With patient Time For Goal Achievement: 04/22/24 Potential to Achieve Goals: Good    Frequency Min 3X/week     Co-evaluation               AM-PAC PT 6 Clicks Mobility  Outcome Measure Help needed turning from your back to your side while in a flat bed without using bedrails?: None Help needed moving from lying on your back to sitting on the side of a flat bed without using bedrails?: None Help needed moving to and from a bed to a chair (including a wheelchair)?: None Help needed standing up from a chair using your arms (e.g., wheelchair or bedside chair)?: None Help needed to walk in hospital room?: A Little Help needed climbing 3-5 steps with a railing? : A Little 6 Click Score: 22    End of Session Equipment Utilized During Treatment: Oxygen Activity Tolerance: Patient tolerated treatment well Patient left: in chair;with call bell/phone within reach Nurse Communication: Mobility status;Other (comment)  (vitals throughout mobility) PT Visit Diagnosis: Muscle weakness (generalized) (M62.81);Other abnormalities of gait and mobility (R26.89);Difficulty in walking, not elsewhere classified (R26.2)    Time: 9068-8975 PT Time Calculation (min) (ACUTE ONLY): 53 min   Charges:   PT Evaluation $PT Eval Moderate Complexity: 1 Mod PT Treatments $Therapeutic Activity: 38-52 mins PT General Charges $$ ACUTE PT VISIT: 1 Visit         Lacinda Fass, PT, DPT  04/12/2024, 11:45 AM

## 2024-04-12 NOTE — Plan of Care (Signed)
  Problem: Acute Rehab PT Goals(only PT should resolve) Goal: Pt Will Transfer Bed To Chair/Chair To Bed Outcome: Progressing Flowsheets (Taken 04/12/2024 1152) Pt will Transfer Bed to Chair/Chair to Bed: Independently Goal: Pt Will Ambulate Outcome: Progressing Flowsheets (Taken 04/12/2024 1152) Pt will Ambulate:  > 125 feet  with modified independence  with least restrictive assistive device Goal: Pt/caregiver will Perform Home Exercise Program Outcome: Progressing Flowsheets (Taken 04/12/2024 1152) Pt/caregiver will Perform Home Exercise Program:  For increased strengthening  For improved balance  Independently   Lacinda Fass, PT, DPT

## 2024-04-12 NOTE — Progress Notes (Signed)
 Patient seen and examined personally, I reviewed the chart, history and physical and admission note, done by admitting physician this morning and agree with the same with following addendum.  Please refer to the morning admission note for more detailed plan of care.  Briefly,   73 y.o. female with hx of CAD, carotid artery disease with severe RICA stenosis s/p R-CEA (12/'24), renal artery stenosis, IPF, CKD stage IIIb, hx HTN although more recent chronic/orthostatic hypotension on midodrine , HLD, hypothyroidism, rheumatoid arthritis, GERD, fibromyalgia, who presented with acute onset of chest tightness and pleuritic chest pain upon awakening and in ED frequently falling asleep and acknowledged constipation and diffuse myalgias.  In the ED: Vitals fairly stable although at times soft on 2 L [place] afebrile. Cr 1.32, HS trop neg  Lactate 0.7  WBC 9  Hb 11  Multiple imaging with CT angio chest PE, CT abdomen pelvis with contrast CT head CT C-spine>> no PE, right lung base infectious or aspiration process enlarged mediastinal and bilateral hilar lymph nodes likely reactive unchanged mild bibasilar pulmonary fibrosis previously characterized as probable UIP, constipation throughout the colon and rectum CT head C-spine no acute fracture or subluxation, previous cervical discectomy. Patient was given IV antibiotics IV fluids pain medication and admitted for pneumonia  Subjective: Seen and examined today She reports she feels much better today alert awake, ambulating in the room after using restroom. Some chest soreness present still Overnight afebrile BP 90s-100, on 2 L [place] Labs shows potassium 3.4 creatinine 1.29 slightly better hemoglobin 9.3 from 11.8 CK was normal UA unremarkable  Assessment and plan:  Community acquired pneumonia  Small R parapneumonic effusion  Hypotension w/ hypovolemia and distributive physiology Acute onset of chest tightness, pleuritic pain w/ shortness of breath: In the ED  noted to have intermittent hypotension, low of 70/30s, improved with IVF, tachypneic in low 20s, also needed 2L O2. CTA PE neg for PE, finding concerning for RLL pneumonia and a small pleural effusion ?  Aspiration Continue on Unasyn , azithromycin,.obtain SLP eval f/u sputum Cx, RVP, continue to optimize respiratory status with as needed bronchodilators OOB and PT OT Recent Labs  Lab 04/11/24 1749 04/11/24 2015 04/12/24 0457  WBC 9.8  --  6.5  LATICACIDVEN  --  0.7  --     Constipation  Noted in the CT scan.  Continue Bowel regimen, enema to start, suppository prn.    Incidental findings:  Mediastinal / hilar LAD, likely reactive.    Chronic medical problems:  Please see admission note for moderate  CAD HLD: Troponins negative in the ED.  Continue home aspirin , Plavix , rosuvastatin .  Carotid artery disease with severe RICA stenosis s/p R-CEA (12/'24 Renal artery stenosis: Continue antiplatelets as above  IPF: ?? Unknown if seeing pulmonology.CXR-basilar predominant pulmonary fibrosis, probable UIP pattern.Needs to establish with pulmonology OP   CKD stage IIIb: Cr at recent best, 1.3.   Chronic hypotension/orthostatic hypotension:\ Continue home midodrine  5 mg 3 times daily  Hypothyroidism: Continue home levothyroxine   Rheumatoid arthritis: Noted not on disease modifying treatment  GERD: Continue home PPI  Fibromyalgia: Cont Tylenol , methocarbamol as needed  Class I Obesity w/ Body mass index is 31.08 kg/m.: Will benefit with PCP follow-up, weight loss,healthy lifestyle and outpatient sleep eval if not done.  Mobility: PT Orders: Active  PT Follow up Rec:    Objective: Vitals last 24 hrs: Vitals:   04/12/24 0021 04/12/24 0325 04/12/24 0433 04/12/24 0707  BP: 107/70 (!) 94/45 (!) 94/44   Pulse: 83 71  72   Resp: 16     Temp: 98.4 F (36.9 C) 98.2 F (36.8 C) 98.4 F (36.9 C)   TempSrc: Oral Oral Oral   SpO2: 97% 96% 98% 98%  Weight: 69.8 kg      Height: 4' 11 (1.499 m)       Physical Examination: General exam: alert awake, oriented, older than stated age HEENT:Oral mucosa moist, Ear/Nose WNL grossly Respiratory system: Bilaterally clear BS,no use of accessory muscle Cardiovascular system: S1 & S2 +, No JVD. Gastrointestinal system: Abdomen soft,NT,ND, BS+ Nervous System: Alert, awake, moving all extremities,and following commands. Extremities: extremities warm, leg edema  neg Skin: No rashes,no icterus. MSK: Normal muscle bulk,tone, power   Medications reviewed:  Scheduled Meds:  albuterol   2.5 mg Nebulization TID   aspirin  EC  81 mg Oral Daily   azithromycin  500 mg Oral QHS   clopidogrel   75 mg Oral Daily   guaiFENesin   600 mg Oral BID   heparin   5,000 Units Subcutaneous Q8H   levothyroxine   25 mcg Oral QAC breakfast   midodrine   5 mg Oral TID WC   mineral oil  1 enema Rectal Once   pantoprazole   40 mg Oral Daily   polyethylene glycol  17 g Oral BID   rosuvastatin   40 mg Oral Daily   senna  2 tablet Oral QHS   sodium chloride  flush  3 mL Intravenous Q12H   traZODone   100 mg Oral QHS   Continuous Infusions:  ampicillin -sulbactam (UNASYN ) IV     lactated ringers  100 mL/hr at 04/12/24 9482   Diet: Diet Order             Diet regular Room service appropriate? Yes; Fluid consistency: Thin  Diet effective now

## 2024-04-13 DIAGNOSIS — K5900 Constipation, unspecified: Secondary | ICD-10-CM

## 2024-04-13 DIAGNOSIS — J9601 Acute respiratory failure with hypoxia: Secondary | ICD-10-CM | POA: Diagnosis not present

## 2024-04-13 DIAGNOSIS — J189 Pneumonia, unspecified organism: Secondary | ICD-10-CM | POA: Diagnosis not present

## 2024-04-13 LAB — COMPREHENSIVE METABOLIC PANEL WITH GFR
ALT: 7 U/L (ref 0–44)
AST: 14 U/L — ABNORMAL LOW (ref 15–41)
Albumin: 3.4 g/dL — ABNORMAL LOW (ref 3.5–5.0)
Alkaline Phosphatase: 67 U/L (ref 38–126)
Anion gap: 13 (ref 5–15)
BUN: 17 mg/dL (ref 8–23)
CO2: 22 mmol/L (ref 22–32)
Calcium: 8.8 mg/dL — ABNORMAL LOW (ref 8.9–10.3)
Chloride: 106 mmol/L (ref 98–111)
Creatinine, Ser: 1.34 mg/dL — ABNORMAL HIGH (ref 0.44–1.00)
GFR, Estimated: 41 mL/min — ABNORMAL LOW (ref >60)
Glucose, Bld: 108 mg/dL — ABNORMAL HIGH (ref 70–99)
Potassium: 4.1 mmol/L (ref 3.5–5.1)
Sodium: 141 mmol/L (ref 135–145)
Total Bilirubin: 0.2 mg/dL (ref 0.0–1.2)
Total Protein: 6.3 g/dL — ABNORMAL LOW (ref 6.5–8.1)

## 2024-04-13 LAB — LIPASE, BLOOD: Lipase: 18 U/L (ref 11–51)

## 2024-04-13 MED ORDER — OXYCODONE HCL 5 MG PO TABS
5.0000 mg | ORAL_TABLET | Freq: Four times a day (QID) | ORAL | Status: DC | PRN
  Administered 2024-04-13 – 2024-04-18 (×14): 5 mg via ORAL
  Filled 2024-04-13 (×16): qty 1

## 2024-04-13 MED ORDER — SENNOSIDES-DOCUSATE SODIUM 8.6-50 MG PO TABS
2.0000 | ORAL_TABLET | Freq: Two times a day (BID) | ORAL | Status: DC
  Administered 2024-04-13 – 2024-04-17 (×8): 2 via ORAL
  Filled 2024-04-13 (×10): qty 2

## 2024-04-13 NOTE — Progress Notes (Signed)
 [facility] PROGRESS NOTE   [name] FMW:[id number] DOB: 07-20-1950 DOA: 04/11/2024  PCP: [name], MD  Brief History: 73 y.o. female with hx of CAD, carotid artery disease with severe RICA stenosis s/p R-CEA (12/'24), renal artery stenosis, IPF, CKD stage IIIb, hx HTN although more recent chronic/orthostatic hypotension on midodrine , HLD, hypothyroidism, rheumatoid arthritis, GERD, fibromyalgia, who presented with acute onset of chest tightness and pleuritic chest pain.  Evaluation raise concern for pneumonia.  Patient was hospitalized for further management.     Consultants: None  Procedures: None    Subjective/Interval History: Patient complains of chronic pain in her neck back abdomen.  No nausea vomiting.  Some shortness of breath is present.  Occasional cough with whitish expectoration.  No blood in the sputum.    Assessment/Plan:  Community acquired pneumonia  Small R parapneumonic effusion  Acute respiratory failure with hypoxia Acute onset of chest tightness, pleuritic pain w/ shortness of breath: In the ED noted to have intermittent hypotension, low of 70/30s, improved with IVF, tachypneic in low 20s, also needed 2L O2. CTA PE neg for PE, finding concerning for RLL pneumonia and a small pleural effusion ?  Aspiration. Patient was started on Unasyn  and azithromycin.  Seen by speech therapy.  Low aspiration risk. Currently on 2 L of oxygen via nasal cannula.  Wean down to maintain saturations greater than 90%. Flutter valve.  Incentive spirometry.  Out of bed to chair.  PT and OT eval. Noted to be afebrile.  WBC was normal.  Respiratory viral panel negative.   Abdominal pain most likely secondary to constipation  Underwent CT scan which showed significant stool burden.  She was started on bowel regimen.  Refused to take her MiraLAX yesterday but will take it this morning.  Continue with MiraLAX and Senokot.  May need suppository.  Abdomen is diffusely  tender but mildly so and fairly benign otherwise.    Normocytic anemia No evidence of overt bleeding.  Drop in hemoglobin is likely dilutional.  Recheck labs tomorrow.  Hypokalemia Supplemented.  Mediastinal/hilar lymphadenopathy Most probably reactive from active infection.  Outpatient follow-up.  Chronic kidney disease stage IIIb Stable renal function noted.  Monitor urine output.  Avoid nephrotoxic agents.  Coronary artery disease/hyperlipidemia Continue aspirin  Plavix  statin.  Troponins were unremarkable.  Carotid artery disease with severe R ICA stenosis status post right CEA in 2024 Stable.  Renal artery stenosis Stable.  Pulmonary fibrosis Noted on CT angiogram.  Unclear if she follows with pulmonology.  Chronic hypotension/orthostatic hypotension On midodrine  which is being continued.  Hypothyroidism Continue levothyroxine .  Rheumatoid arthritis Not on any disease modifying treatment.  Chronic pain issues Uses just acetaminophen  for pain control.  Obesity Estimated body mass index is 31.53 kg/m as calculated from the following:   Height as of this encounter: 4' 11 (1.499 m).   Weight as of this encounter: 70.8 kg.  DVT Prophylaxis: Subcutaneous heparin  Code Status: Full code Family Communication: Discussed with patient.  No family at bedside Disposition Plan: Home health recommended by physical therapy.  Not yet ready for discharge     Medications: Scheduled:  albuterol   2.5 mg Nebulization TID   aspirin  EC  81 mg Oral Daily   azithromycin  500 mg Oral QHS   clopidogrel   75 mg Oral Daily   guaiFENesin   600 mg Oral BID   heparin   5,000 Units Subcutaneous Q8H   levothyroxine   25 mcg Oral QAC breakfast   midodrine   5 mg Oral  TID WC   mineral oil  1 enema Rectal Once   pantoprazole   40 mg Oral Daily   polyethylene glycol  17 g Oral BID   rosuvastatin   40 mg Oral Daily   senna-docusate  2 tablet Oral BID   sodium chloride  flush  3 mL Intravenous  Q12H   traZODone   100 mg Oral QHS   Continuous:  ampicillin -sulbactam (UNASYN ) IV 3 g (04/13/24 0325)   PRN:acetaminophen , albuterol , bisacodyl , lidocaine , melatonin, methocarbamol, ondansetron  (ZOFRAN ) IV, oxyCODONE , polyethylene glycol  Antibiotics: Anti-infectives (From admission, onward)    Start     Dose/Rate Route Frequency Ordered Stop   04/12/24 2200  Ampicillin -Sulbactam (UNASYN ) 3 g in sodium chloride  0.9 % 100 mL IVPB        3 g 200 mL/hr over 30 Minutes Intravenous Every 6 hours 04/12/24 0321 04/16/24 2159   04/12/24 0320  azithromycin (ZITHROMAX) tablet 500 mg        500 mg Oral Daily at bedtime 04/12/24 0321 04/14/24 2159   04/11/24 2215  cefTRIAXone  (ROCEPHIN ) 1 g in sodium chloride  0.9 % 100 mL IVPB        1 g 200 mL/hr over 30 Minutes Intravenous  Once 04/11/24 2208 04/11/24 2300   04/11/24 2215  azithromycin (ZITHROMAX) 500 mg in sodium chloride  0.9 % 250 mL IVPB        500 mg 250 mL/hr over 60 Minutes Intravenous  Once 04/11/24 2208 04/12/24 0000       Objective:  Vital Signs  Vitals:   04/13/24 0018 04/13/24 0500 04/13/24 0501 04/13/24 0706  BP: 119/60  102/65   Pulse: 88  89   Resp: (!) 21  (!) 21   Temp: 98 F (36.7 C)  99.1 F (37.3 C)   TempSrc: Oral  Oral   SpO2: 96%  96% 97%  Weight:  70.8 kg    Height:        Intake/Output Summary (Last 24 hours) at 04/13/2024 0837 Last data filed at 04/12/2024 2118 Gross per 24 hour  Intake 2544.87 ml  Output 650 ml  Net 1894.87 ml   Filed Weights   04/11/24 1728 04/12/24 0021 04/13/24 0500  Weight: 60 kg 69.8 kg 70.8 kg    General appearance: Awake alert.  In no distress Resp: Crackles heard bilateral lower lungs right more than left.  No wheezing or rhonchi. Cardio: S1-S2 is normal regular.  No S3-S4.  No rubs murmurs or bruit GI: Abdomen is soft.  Mildly tender diffusely without any rebound rigidity or guarding. Extremities: No edema.  Moving her extremities but physical deconditioning is  noted. Neurologic: Alert and oriented x3.  No focal neurological deficits.    Lab Results:  Data Reviewed: I have personally reviewed following labs and reports of the imaging studies  CBC: Recent Labs  Lab 04/11/24 1749 04/12/24 0457  WBC 9.8 6.5  NEUTROABS 8.0*  --   HGB 11.8* 9.4*  HCT 36.7 29.1*  MCV 97.3 99.0  PLT 189 162    Basic Metabolic Panel: Recent Labs  Lab 04/11/24 1749 04/12/24 0457 04/13/24 0424  NA 136 138 141  K 3.7 3.4* 4.1  CL 96* 102 106  CO2 25 25 22   GLUCOSE 103* 87 108*  BUN 21 20 17   CREATININE 1.32* 1.29* 1.34*  CALCIUM  8.7* 8.4* 8.8*  MG 2.1 2.1  --   PHOS  --  4.4  --     GFR: Estimated Creatinine Clearance: 31 mL/min (A) (by C-G formula based  on SCr of 1.34 mg/dL (H)).  Liver Function Tests: Recent Labs  Lab 04/11/24 1749 04/13/24 0424  AST 16 14*  ALT 8 7  ALKPHOS 73 67  BILITOT 0.8 0.2  PROT 7.5 6.3*  ALBUMIN  3.5 3.4*    Cardiac Enzymes: Recent Labs  Lab 04/11/24 1749  CKTOTAL 57    CBG: Recent Labs  Lab 04/11/24 2003  GLUCAP 106*    Recent Results (from the past 240 hours)  Resp panel by RT-PCR (RSV, Flu A&B, Covid) Anterior Nasal Swab     Status: None   Collection Time: 04/11/24  6:25 PM   Specimen: Anterior Nasal Swab  Result Value Ref Range Status   SARS Coronavirus 2 by RT PCR NEGATIVE NEGATIVE Final    Comment: (NOTE) SARS-CoV-2 target nucleic acids are NOT DETECTED.  The SARS-CoV-2 RNA is generally detectable in upper respiratory specimens during the acute phase of infection. The lowest concentration of SARS-CoV-2 viral copies this assay can detect is 138 copies/mL. A negative result does not preclude SARS-Cov-2 infection and should not be used as the sole basis for treatment or other patient management decisions. A negative result may occur with  improper specimen collection/handling, submission of specimen other than nasopharyngeal swab, presence of viral mutation(s) within the areas targeted  by this assay, and inadequate number of viral copies(<138 copies/mL). A negative result must be combined with clinical observations, patient history, and epidemiological information. The expected result is Negative.  Fact Sheet for Patients:  BloggerCourse.com  Fact Sheet for Healthcare Providers:  SeriousBroker.it  This test is no t yet approved or cleared by the [place]  FDA and  has been authorized for detection and/or diagnosis of SARS-CoV-2 by FDA under an Emergency Use Authorization (EUA). This EUA will remain  in effect (meaning this test can be used) for the duration of the COVID-19 declaration under Section 564(b)(1) of the Act, 21 U.S.C.section 360bbb-3(b)(1), unless the authorization is terminated  or revoked sooner.       Influenza A by PCR NEGATIVE NEGATIVE Final   Influenza B by PCR NEGATIVE NEGATIVE Final    Comment: (NOTE) The Xpert Xpress SARS-CoV-2/FLU/RSV plus assay is intended as an aid in the diagnosis of influenza from Nasopharyngeal swab specimens and should not be used as a sole basis for treatment. Nasal washings and aspirates are unacceptable for Xpert Xpress SARS-CoV-2/FLU/RSV testing.  Fact Sheet for Patients: BloggerCourse.com  Fact Sheet for Healthcare Providers: SeriousBroker.it  This test is not yet approved or cleared by the [place]  FDA and has been authorized for detection and/or diagnosis of SARS-CoV-2 by FDA under an Emergency Use Authorization (EUA). This EUA will remain in effect (meaning this test can be used) for the duration of the COVID-19 declaration under Section 564(b)(1) of the Act, 21 U.S.C. section 360bbb-3(b)(1), unless the authorization is terminated or revoked.     Resp Syncytial Virus by PCR NEGATIVE NEGATIVE Final    Comment: (NOTE) Fact Sheet for Patients: BloggerCourse.com  Fact  Sheet for Healthcare Providers: SeriousBroker.it  This test is not yet approved or cleared by the [place]  FDA and has been authorized for detection and/or diagnosis of SARS-CoV-2 by FDA under an Emergency Use Authorization (EUA). This EUA will remain in effect (meaning this test can be used) for the duration of the COVID-19 declaration under Section 564(b)(1) of the Act, 21 U.S.C. section 360bbb-3(b)(1), unless the authorization is terminated or revoked.  Performed at [facility], [address] [place], [place] [address]   Blood  Culture (routine x 2)     Status: None (Preliminary result)   Collection Time: 04/11/24  8:15 PM   Specimen: Left Antecubital; Blood  Result Value Ref Range Status   Specimen Description LEFT ANTECUBITAL  Final   Special Requests   Final    BOTTLES DRAWN AEROBIC AND ANAEROBIC Blood Culture adequate volume   Culture   Final    NO GROWTH 2 DAYS Performed at [facility], [address], [place], [place] [address]    Report Status PENDING  Incomplete  Blood Culture (routine x 2)     Status: None (Preliminary result)   Collection Time: 04/11/24  8:42 PM   Specimen: BLOOD  Result Value Ref Range Status   Specimen Description BLOOD BLOOD RIGHT ARM  Final   Special Requests NONE  Final   Culture   Final    NO GROWTH 2 DAYS Performed at [facility], [address], [place], [place] [address]    Report Status PENDING  Incomplete  Respiratory (~20 pathogens) panel by PCR     Status: None   Collection Time: 04/12/24  3:21 AM   Specimen: Nasopharyngeal Swab; Respiratory  Result Value Ref Range Status   Adenovirus NOT DETECTED NOT DETECTED Final   Coronavirus 229E NOT DETECTED NOT DETECTED Final    Comment: (NOTE) The Coronavirus on the Respiratory Panel, DOES NOT test for the novel  Coronavirus (2019 nCoV)    Coronavirus HKU1 NOT DETECTED NOT DETECTED Final   Coronavirus NL63 NOT DETECTED NOT DETECTED Final   Coronavirus  OC43 NOT DETECTED NOT DETECTED Final   Metapneumovirus NOT DETECTED NOT DETECTED Final   Rhinovirus / Enterovirus NOT DETECTED NOT DETECTED Final   Influenza A NOT DETECTED NOT DETECTED Final   Influenza B NOT DETECTED NOT DETECTED Final   Parainfluenza Virus 1 NOT DETECTED NOT DETECTED Final   Parainfluenza Virus 2 NOT DETECTED NOT DETECTED Final   Parainfluenza Virus 3 NOT DETECTED NOT DETECTED Final   Parainfluenza Virus 4 NOT DETECTED NOT DETECTED Final   Respiratory Syncytial Virus NOT DETECTED NOT DETECTED Final   Bordetella pertussis NOT DETECTED NOT DETECTED Final   Bordetella Parapertussis NOT DETECTED NOT DETECTED Final   Chlamydophila pneumoniae NOT DETECTED NOT DETECTED Final   Mycoplasma pneumoniae NOT DETECTED NOT DETECTED Final    Comment: Performed at [facility], [address], [place], [place] [address]  MRSA Next Gen by PCR, Nasal     Status: None   Collection Time: 04/12/24 11:52 AM   Specimen: Nasal Mucosa; Nasal Swab  Result Value Ref Range Status   MRSA by PCR Next Gen NOT DETECTED NOT DETECTED Final    Comment: (NOTE) The GeneXpert MRSA Assay (FDA approved for NASAL specimens only), is one component of a comprehensive MRSA colonization surveillance program. It is not intended to diagnose MRSA infection nor to guide or monitor treatment for MRSA infections. Test performance is not FDA approved in patients less than 60 years old. Performed at [facility], [address], [place], [place] [address]       Radiology Studies: CT Angio Chest PE W and/or Wo Contrast Result Date: 04/11/2024 CLINICAL DATA:  PE suspected, chest and abdominal pain EXAM: CT ANGIOGRAPHY CHEST CT ABDOMEN AND PELVIS WITH CONTRAST TECHNIQUE: Multidetector CT imaging of the chest was performed using the standard protocol during bolus administration of intravenous contrast. Multiplanar CT image reconstructions and MIPs were obtained to evaluate the vascular anatomy. Multidetector CT  imaging of the abdomen and  pelvis was performed using the standard protocol during bolus administration of intravenous contrast. RADIATION DOSE REDUCTION: This exam was performed according to the departmental dose-optimization program which includes automated exposure control, adjustment of the mA and/or kV according to patient size and/or use of iterative reconstruction technique. CONTRAST:  75mL OMNIPAQUE  IOHEXOL  350 MG/ML SOLN COMPARISON:  CT chest angiogram, 10/22/2023 FINDINGS: CT CHEST ANGIOGRAM FINDINGS Cardiovascular: Satisfactory opacification of the pulmonary arteries to the segmental level. No evidence of pulmonary embolism. Normal heart size. Extensive three-vessel coronary artery calcifications. No pericardial effusion. Severe aortic atherosclerosis. Mediastinum/Nodes: Numerous enlarged mediastinal and bilateral hilar lymph nodes, pretracheal nodes measuring up to 1.6 x 1.5 cm (series 8, image 66) thyroid  gland, trachea, and esophagus demonstrate no significant findings. Lungs/Pleura: Heterogeneous and consolidative airspace opacity in the right lung base with a small associated right pleural effusion (series 9, image 71). Unchanged mild bibasilar predominant pulmonary fibrosis. Minimal paraseptal emphysema. Musculoskeletal: No chest wall abnormality. No acute osseous findings. Review of the MIP images confirms the above findings. CT ABDOMEN PELVIS FINDINGS Hepatobiliary: No focal liver abnormality is seen. Status post cholecystectomy. Postoperative biliary dilatation. Pancreas: Unremarkable. No pancreatic ductal dilatation or surrounding inflammatory changes. Spleen: Normal in size without significant abnormality. Adrenals/Urinary Tract: Adrenal glands are unremarkable. Simple, benign bilateral renal cortical cysts for which no further follow-up or characterization is required. Kidneys are otherwise normal, without renal calculi, solid lesion, or hydronephrosis. Bladder is unremarkable. Stomach/Bowel:  Stomach is within normal limits. Appendix not clearly visualized. No evidence of bowel wall thickening, distention, or inflammatory changes. Large burden of stool and stool balls throughout the colon and rectum Vascular/Lymphatic: Severe aortic atherosclerosis. No enlarged abdominal or pelvic lymph nodes. Reproductive: Hysterectomy. Other: No abdominal wall hernia or abnormality. No ascites. Musculoskeletal: No acute or significant osseous findings. IMPRESSION: 1. Negative examination for pulmonary embolism. 2. Heterogeneous and consolidative airspace opacity in the right lung base with a small associated right pleural effusion, consistent with infection or aspiration. 3. Numerous enlarged mediastinal and bilateral hilar lymph nodes, likely reactive. 4. 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 5. Large burden of stool and stool balls throughout the colon and rectum. 6. Coronary artery disease. Aortic Atherosclerosis (ICD10-I70.0) and Emphysema (ICD10-J43.9). Electronically Signed   By: Marolyn JONETTA Jaksch M.D.   On: 04/11/2024 21:49   CT ABDOMEN PELVIS W CONTRAST Result Date: 04/11/2024 CLINICAL DATA:  PE suspected, chest and abdominal pain EXAM: CT ANGIOGRAPHY CHEST CT ABDOMEN AND PELVIS WITH CONTRAST TECHNIQUE: Multidetector CT imaging of the chest was performed using the standard protocol during bolus administration of intravenous contrast. Multiplanar CT image reconstructions and MIPs were obtained to evaluate the vascular anatomy. Multidetector CT imaging of the abdomen and pelvis was performed using the standard protocol during bolus administration of intravenous contrast. RADIATION DOSE REDUCTION: This exam was performed according to the departmental dose-optimization program which includes automated exposure control, adjustment of the mA and/or kV according to patient size and/or use of iterative reconstruction technique. CONTRAST:  75mL  OMNIPAQUE  IOHEXOL  350 MG/ML SOLN COMPARISON:  CT chest angiogram, 10/22/2023 FINDINGS: CT CHEST ANGIOGRAM FINDINGS Cardiovascular: Satisfactory opacification of the pulmonary arteries to the segmental level. No evidence of pulmonary embolism. Normal heart size. Extensive three-vessel coronary artery calcifications. No pericardial effusion. Severe aortic atherosclerosis. Mediastinum/Nodes: Numerous enlarged mediastinal and bilateral hilar lymph nodes, pretracheal nodes measuring up to 1.6 x 1.5 cm (series 8, image 66) thyroid  gland, trachea, and esophagus demonstrate no significant findings. Lungs/Pleura: Heterogeneous and consolidative airspace  opacity in the right lung base with a small associated right pleural effusion (series 9, image 71). Unchanged mild bibasilar predominant pulmonary fibrosis. Minimal paraseptal emphysema. Musculoskeletal: No chest wall abnormality. No acute osseous findings. Review of the MIP images confirms the above findings. CT ABDOMEN PELVIS FINDINGS Hepatobiliary: No focal liver abnormality is seen. Status post cholecystectomy. Postoperative biliary dilatation. Pancreas: Unremarkable. No pancreatic ductal dilatation or surrounding inflammatory changes. Spleen: Normal in size without significant abnormality. Adrenals/Urinary Tract: Adrenal glands are unremarkable. Simple, benign bilateral renal cortical cysts for which no further follow-up or characterization is required. Kidneys are otherwise normal, without renal calculi, solid lesion, or hydronephrosis. Bladder is unremarkable. Stomach/Bowel: Stomach is within normal limits. Appendix not clearly visualized. No evidence of bowel wall thickening, distention, or inflammatory changes. Large burden of stool and stool balls throughout the colon and rectum Vascular/Lymphatic: Severe aortic atherosclerosis. No enlarged abdominal or pelvic lymph nodes. Reproductive: Hysterectomy. Other: No abdominal wall hernia or abnormality. No ascites.  Musculoskeletal: No acute or significant osseous findings. IMPRESSION: 1. Negative examination for pulmonary embolism. 2. Heterogeneous and consolidative airspace opacity in the right lung base with a small associated right pleural effusion, consistent with infection or aspiration. 3. Numerous enlarged mediastinal and bilateral hilar lymph nodes, likely reactive. 4. 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 5. Large burden of stool and stool balls throughout the colon and rectum. 6. Coronary artery disease. Aortic Atherosclerosis (ICD10-I70.0) and Emphysema (ICD10-J43.9). Electronically Signed   By: Marolyn JONETTA Jaksch M.D.   On: 04/11/2024 21:49   CT Head Wo Contrast Result Date: 04/11/2024 CLINICAL DATA:  Head and neck pain, no known injury EXAM: CT HEAD WITHOUT CONTRAST CT CERVICAL SPINE WITHOUT CONTRAST TECHNIQUE: Multidetector CT imaging of the head and cervical spine was performed following the standard protocol without intravenous contrast. Multiplanar CT image reconstructions of the cervical spine were also generated. RADIATION DOSE REDUCTION: This exam was performed according to the departmental dose-optimization program which includes automated exposure control, adjustment of the mA and/or kV according to patient size and/or use of iterative reconstruction technique. COMPARISON:  MR brain, 11/26/2023 FINDINGS: CT HEAD FINDINGS Brain: No evidence of acute infarction, hemorrhage, hydrocephalus, extra-axial collection or mass lesion/mass effect. Periventricular and deep white matter hypodensity. Vascular: No hyperdense vessel or unexpected calcification. Skull: Normal. Negative for fracture or focal lesion. Sinuses/Orbits: No acute finding. Other: None. CT CERVICAL SPINE FINDINGS Alignment: Postoperative straightening of the normal cervical lordosis. Skull base and vertebrae: No acute fracture. No primary bone lesion or focal pathologic process.  Soft tissues and spinal canal: No prevertebral fluid or swelling. No visible canal hematoma. Disc levels: Anterior cervical discectomy and fusion of C4 through C7. Mild-to-moderate disc space height loss and osteophytosis of the remaining cervical levels. Upper chest: Negative. Other: None. IMPRESSION: 1. No acute intracranial pathology. 2. No fracture or static subluxation of the cervical spine. 3. Anterior cervical discectomy and fusion of C4 through C7. Mild-to-moderate disc space height loss and osteophytosis of the remaining cervical levels. Electronically Signed   By: Marolyn JONETTA Jaksch M.D.   On: 04/11/2024 21:42   CT Cervical Spine Wo Contrast Result Date: 04/11/2024 CLINICAL DATA:  Head and neck pain, no known injury EXAM: CT HEAD WITHOUT CONTRAST CT CERVICAL SPINE WITHOUT CONTRAST TECHNIQUE: Multidetector CT imaging of the head and cervical spine was performed following the standard protocol without intravenous contrast. Multiplanar CT image reconstructions of the cervical spine were also generated. RADIATION DOSE REDUCTION: This exam was performed according  to the departmental dose-optimization program which includes automated exposure control, adjustment of the mA and/or kV according to patient size and/or use of iterative reconstruction technique. COMPARISON:  MR brain, 11/26/2023 FINDINGS: CT HEAD FINDINGS Brain: No evidence of acute infarction, hemorrhage, hydrocephalus, extra-axial collection or mass lesion/mass effect. Periventricular and deep white matter hypodensity. Vascular: No hyperdense vessel or unexpected calcification. Skull: Normal. Negative for fracture or focal lesion. Sinuses/Orbits: No acute finding. Other: None. CT CERVICAL SPINE FINDINGS Alignment: Postoperative straightening of the normal cervical lordosis. Skull base and vertebrae: No acute fracture. No primary bone lesion or focal pathologic process. Soft tissues and spinal canal: No prevertebral fluid or swelling. No visible canal  hematoma. Disc levels: Anterior cervical discectomy and fusion of C4 through C7. Mild-to-moderate disc space height loss and osteophytosis of the remaining cervical levels. Upper chest: Negative. Other: None. IMPRESSION: 1. No acute intracranial pathology. 2. No fracture or static subluxation of the cervical spine. 3. Anterior cervical discectomy and fusion of C4 through C7. Mild-to-moderate disc space height loss and osteophytosis of the remaining cervical levels. Electronically Signed   By: Marolyn JONETTA Jaksch M.D.   On: 04/11/2024 21:42       LOS: 2 days   Candas Deemer Verdene  [facility] Pager on [email or web address]  04/13/2024, 8:37 AM

## 2024-04-13 NOTE — Progress Notes (Signed)
   04/13/24 1253  Assess: MEWS Score  Temp 99.1 F (37.3 C)  BP (!) 100/40  MAP (mmHg) (!) 59  Pulse Rate 81  SpO2 95 %  O2 Device Nasal Cannula  O2 Flow Rate (L/min) 1 L/min  Assess: MEWS Score  MEWS Temp 0  MEWS Systolic 1  MEWS Pulse 0  MEWS RR 1  MEWS LOC 0  MEWS Score 2  MEWS Score Color Yellow  Assess: if the MEWS score is Yellow or Red  Were vital signs accurate and taken at a resting state? Yes  Does the patient meet 2 or more of the SIRS criteria? No  MEWS guidelines implemented  No, previously red, continue vital signs every 4 hours  Notify: Charge Nurse/RN  Name of Charge Nurse/RN Notified Ronal Rather, RN  Assess: SIRS CRITERIA  SIRS Temperature  0  SIRS Respirations  1  SIRS Pulse 0  SIRS WBC 0  SIRS Score Sum  1   Notified of above vital signs by NT. Pt just received scheduled Midodrine  and was assisted up to [facility] for void & BM. Pt with no complaint, ambulated with one standby assist in room. Currently back in bed, states did not sleep at all last night and wants to take a nap. Lights dimmed, pt positioned for comfort in bed. Call bell within reach, bed alarm on for safety. Advised to call for needs. Pt states understanding.

## 2024-04-13 NOTE — Plan of Care (Signed)

## 2024-04-13 NOTE — Progress Notes (Signed)
 Pt A&O x 4 on morning rounds. States was unable to sleep at all last night despite taking PRN sleep med.  States, I hurt all over honey, but it ain't nothing new. I got fibromyalgia, rheumatoid arthritis, neuropahty in both my legs and feet, back pain, and my head aches from not being able to sleep. It just comes with being old! Denies need for pain medication at this time. Denies c/o SOB, resp even and non-labored, O2 on @ 1 lpm [place]. Advised to call for needs, states understanding.

## 2024-04-13 NOTE — TOC Progression Note (Signed)
 Transition of Care [facility]) - Progression Note    Patient Details  Name: [name] MRN: [id number] Date of Birth: 1951/01/14  Transition of [facility]) CM/SW Contact  Lucie Lunger, [place] Phone Number: 04/13/2024, 9:36 AM  Clinical Narrative:    CSW updated [name] with [facility] who states that pt will be able to return once medically stable. Pt will have HH PT once arriving back, they will be able to get therapy arranged for pt. TOC to follow.   Expected Discharge Plan: Home w Home Health Services Barriers to Discharge: Continued Medical Work up               Expected Discharge Plan and Services     Post Acute Care Choice: Home Health                                         Social Drivers of Health (SDOH) Interventions SDOH Screenings   Food Insecurity: No Food Insecurity (04/12/2024)  Housing: Unknown (04/12/2024)  Transportation Needs: No Transportation Needs (04/12/2024)  Utilities: Not At Risk (04/12/2024)  Depression (PHQ2-9): Low Risk  (03/11/2020)  Financial Resource Strain: Medium Risk (07/31/2023)   Received from [facility]  Social Connections: Unknown (04/12/2024)  Tobacco Use: Medium Risk (04/11/2024)  Health Literacy: High Risk (07/31/2023)   Received from [facility]    Readmission Risk Interventions    04/12/2024   11:48 AM 11/27/2023    7:51 AM 10/20/2023    8:13 PM  Readmission Risk Prevention Plan  Transportation Screening Complete Complete Complete  PCP or Specialist Appt within 3-5 Days Not Complete  Complete  HRI or Home Care Consult Complete  Complete  Social Work Consult for Recovery Care Planning/Counseling Complete  Complete  Palliative Care Screening Not Applicable  Not Applicable  Medication Review Oceanographer) Complete Complete Complete  HRI or Home Care Consult  Complete   SW Recovery Care/Counseling Consult  Complete   Palliative Care Screening  Not Applicable   Skilled Nursing Facility  Not Applicable

## 2024-04-14 DIAGNOSIS — J9601 Acute respiratory failure with hypoxia: Secondary | ICD-10-CM | POA: Diagnosis not present

## 2024-04-14 DIAGNOSIS — J189 Pneumonia, unspecified organism: Secondary | ICD-10-CM | POA: Diagnosis not present

## 2024-04-14 LAB — COMPREHENSIVE METABOLIC PANEL WITH GFR
ALT: 7 U/L (ref 0–44)
AST: 15 U/L (ref 15–41)
Albumin: 3.4 g/dL — ABNORMAL LOW (ref 3.5–5.0)
Alkaline Phosphatase: 67 U/L (ref 38–126)
Anion gap: 11 (ref 5–15)
BUN: 16 mg/dL (ref 8–23)
CO2: 23 mmol/L (ref 22–32)
Calcium: 8.8 mg/dL — ABNORMAL LOW (ref 8.9–10.3)
Chloride: 105 mmol/L (ref 98–111)
Creatinine, Ser: 1.29 mg/dL — ABNORMAL HIGH (ref 0.44–1.00)
GFR, Estimated: 43 mL/min — ABNORMAL LOW (ref >60)
Glucose, Bld: 106 mg/dL — ABNORMAL HIGH (ref 70–99)
Potassium: 4.5 mmol/L (ref 3.5–5.1)
Sodium: 139 mmol/L (ref 135–145)
Total Bilirubin: 0.3 mg/dL (ref 0.0–1.2)
Total Protein: 6.4 g/dL — ABNORMAL LOW (ref 6.5–8.1)

## 2024-04-14 LAB — CBC
HCT: 27.6 % — ABNORMAL LOW (ref 36.0–46.0)
Hemoglobin: 8.7 g/dL — ABNORMAL LOW (ref 12.0–15.0)
MCH: 31.3 pg (ref 26.0–34.0)
MCHC: 31.5 g/dL (ref 30.0–36.0)
MCV: 99.3 fL (ref 80.0–100.0)
Platelets: 165 K/uL (ref 150–400)
RBC: 2.78 MIL/uL — ABNORMAL LOW (ref 3.87–5.11)
RDW: 13.8 % (ref 11.5–15.5)
WBC: 7.8 K/uL (ref 4.0–10.5)
nRBC: 0 % (ref 0.0–0.2)

## 2024-04-14 MED ORDER — ALBUTEROL SULFATE (2.5 MG/3ML) 0.083% IN NEBU
2.5000 mg | INHALATION_SOLUTION | Freq: Two times a day (BID) | RESPIRATORY_TRACT | Status: DC
  Administered 2024-04-14 – 2024-04-18 (×7): 2.5 mg via RESPIRATORY_TRACT
  Filled 2024-04-14 (×8): qty 3

## 2024-04-14 MED ORDER — ALPRAZOLAM 0.5 MG PO TABS
0.5000 mg | ORAL_TABLET | Freq: Two times a day (BID) | ORAL | Status: DC
  Administered 2024-04-14 – 2024-04-17 (×7): 0.5 mg via ORAL
  Filled 2024-04-14 (×8): qty 1

## 2024-04-14 MED ORDER — ESCITALOPRAM OXALATE 10 MG PO TABS
10.0000 mg | ORAL_TABLET | Freq: Every day | ORAL | Status: DC
  Administered 2024-04-14 – 2024-04-18 (×5): 10 mg via ORAL
  Filled 2024-04-14 (×5): qty 1

## 2024-04-14 NOTE — Plan of Care (Signed)

## 2024-04-14 NOTE — Progress Notes (Signed)
 [facility] PROGRESS NOTE   [name] FMW:[id number] DOB: 1950/11/12 DOA: 04/11/2024  PCP: [name], MD  Brief History: 73 y.o. female with hx of CAD, carotid artery disease with severe RICA stenosis s/p R-CEA (12/'24), renal artery stenosis, IPF, CKD stage IIIb, hx HTN although more recent chronic/orthostatic hypotension on midodrine , HLD, hypothyroidism, rheumatoid arthritis, GERD, fibromyalgia, who presented with acute onset of chest tightness and pleuritic chest pain.  Evaluation raise concern for pneumonia.  Patient was hospitalized for further management.     Consultants: None  Procedures: None  Subjective/Interval History: Patient mentioned that she is feeling slightly better.  Her shortness of breath is improved.  Chronic pain is also better.  Still feels constipated.    Assessment/Plan:  Community acquired pneumonia  Small R parapneumonic effusion  Acute respiratory failure with hypoxia Acute onset of chest tightness, pleuritic pain w/ shortness of breath: In the ED noted to have intermittent hypotension, low of 70/30s, improved with IVF, tachypneic in low 20s, also needed 2L O2. CTA PE neg for PE, finding concerning for RLL pneumonia and a small pleural effusion/?Aspiration. Patient was started on Unasyn  and azithromycin.  Seen by speech therapy.  Low aspiration risk. Currently on 2 L of oxygen via nasal cannula.  Wean down to maintain saturations greater than 90%. Flutter valve.  Incentive spirometry.  Out of bed to chair.  PT and OT eval. Respiratory status is stable.  Continue IV antibiotics for another 24 hours and then transition to oral tomorrow if she continues to improve.  Hopefully we can wean her off of oxygen. WBC remains normal.  Respiratory viral panel was negative.   Abdominal pain most likely secondary to constipation  Underwent CT scan which showed significant stool burden.  She was started on bowel regimen.  Continue to encourage her to  take her laxatives.  Abdomen is benign.  Did have 2 bowel movements yesterday.    Normocytic anemia Drop in hemoglobin is most likely dilutional.  No overt bleeding noted.  Hemoglobin was 12.5 in May.   Brown stool noted. Will check anemia panel if one has not been done recently.  Check FOBT.  Hypokalemia Supplemented.  Mediastinal/hilar lymphadenopathy Most probably reactive from active infection.  Outpatient follow-up.  Chronic kidney disease stage IIIb Stable renal function noted.  Monitor urine output.  Avoid nephrotoxic agents.  Coronary artery disease/hyperlipidemia Continue aspirin  Plavix  statin.  Troponins were unremarkable.  Carotid artery disease with severe R ICA stenosis status post right CEA in 2024 Stable.  Renal artery stenosis Stable.  Pulmonary fibrosis Noted on CT angiogram.  Unclear if she follows with pulmonology.  Chronic hypotension/orthostatic hypotension On midodrine  which is being continued.  Hypothyroidism Continue levothyroxine .  Rheumatoid arthritis Not on any disease modifying treatment.  Chronic pain issues Uses just acetaminophen  for pain control.  Obesity Estimated body mass index is 31.66 kg/m as calculated from the following:   Height as of this encounter: 4' 11 (1.499 m).   Weight as of this encounter: 71.1 kg.  DVT Prophylaxis: Subcutaneous heparin  Code Status: Full code Family Communication: Discussed with patient.  No family at bedside Disposition Plan: Home health recommended by physical therapy.  Not yet ready for discharge     Medications: Scheduled:  albuterol   2.5 mg Nebulization TID   aspirin  EC  81 mg Oral Daily   clopidogrel   75 mg Oral Daily   escitalopram  10 mg Oral Daily   guaiFENesin   600 mg Oral BID   heparin   5,000 Units Subcutaneous Q8H   levothyroxine   25 mcg Oral QAC breakfast   midodrine   5 mg Oral TID WC   mineral oil  1 enema Rectal Once   pantoprazole   40 mg Oral Daily   polyethylene glycol   17 g Oral BID   rosuvastatin   40 mg Oral Daily   senna-docusate  2 tablet Oral BID   sodium chloride  flush  3 mL Intravenous Q12H   traZODone   100 mg Oral QHS   Continuous:  ampicillin -sulbactam (UNASYN ) IV 3 g (04/14/24 0514)   PRN:acetaminophen , albuterol , bisacodyl , lidocaine , melatonin, methocarbamol, ondansetron  (ZOFRAN ) IV, oxyCODONE , polyethylene glycol  Antibiotics: Anti-infectives (From admission, onward)    Start     Dose/Rate Route Frequency Ordered Stop   04/12/24 2200  Ampicillin -Sulbactam (UNASYN ) 3 g in sodium chloride  0.9 % 100 mL IVPB        3 g 200 mL/hr over 30 Minutes Intravenous Every 6 hours 04/12/24 0321 04/16/24 2159   04/12/24 0320  azithromycin (ZITHROMAX) tablet 500 mg        500 mg Oral Daily at bedtime 04/12/24 0321 04/13/24 2059   04/11/24 2215  cefTRIAXone  (ROCEPHIN ) 1 g in sodium chloride  0.9 % 100 mL IVPB        1 g 200 mL/hr over 30 Minutes Intravenous  Once 04/11/24 2208 04/11/24 2300   04/11/24 2215  azithromycin (ZITHROMAX) 500 mg in sodium chloride  0.9 % 250 mL IVPB        500 mg 250 mL/hr over 60 Minutes Intravenous  Once 04/11/24 2208 04/12/24 0000       Objective:  Vital Signs  Vitals:   04/14/24 0139 04/14/24 0500 04/14/24 0632 04/14/24 0826  BP: (!) 105/56  (!) 96/45   Pulse: 95  89   Resp:   18   Temp:   99.6 F (37.6 C)   TempSrc:   Oral   SpO2: 92%  95% 95%  Weight:  71.1 kg    Height:        Intake/Output Summary (Last 24 hours) at 04/14/2024 0951 Last data filed at 04/14/2024 0415 Gross per 24 hour  Intake 1223 ml  Output 150 ml  Net 1073 ml   Filed Weights   04/12/24 0021 04/13/24 0500 04/14/24 0500  Weight: 69.8 kg 70.8 kg 71.1 kg    General appearance: Awake alert.  In no distress Resp: Clear to auscultation bilaterally.  Normal effort Cardio: S1-S2 is normal regular.  No S3-S4.  No rubs murmurs or bruit GI: Abdomen is soft.  Nontender nondistended.  Bowel sounds are present normal.  No masses  organomegaly Extremities: No edema.  Physical deconditioning noted. No obvious focal neurological deficits.  Lab Results:  Data Reviewed: I have personally reviewed following labs and reports of the imaging studies  CBC: Recent Labs  Lab 04/11/24 1749 04/12/24 0457 04/14/24 0420  WBC 9.8 6.5 7.8  NEUTROABS 8.0*  --   --   HGB 11.8* 9.4* 8.7*  HCT 36.7 29.1* 27.6*  MCV 97.3 99.0 99.3  PLT 189 162 165    Basic Metabolic Panel: Recent Labs  Lab 04/11/24 1749 04/12/24 0457 04/13/24 0424 04/14/24 0420  NA 136 138 141 139  K 3.7 3.4* 4.1 4.5  CL 96* 102 106 105  CO2 25 25 22 23   GLUCOSE 103* 87 108* 106*  BUN 21 20 17 16   CREATININE 1.32* 1.29* 1.34* 1.29*  CALCIUM  8.7* 8.4* 8.8* 8.8*  MG 2.1 2.1  --   --  PHOS  --  4.4  --   --     GFR: Estimated Creatinine Clearance: 32.4 mL/min (A) (by C-G formula based on SCr of 1.29 mg/dL (H)).  Liver Function Tests: Recent Labs  Lab 04/11/24 1749 04/13/24 0424 04/14/24 0420  AST 16 14* 15  ALT 8 7 7   ALKPHOS 73 67 67  BILITOT 0.8 0.2 0.3  PROT 7.5 6.3* 6.4*  ALBUMIN  3.5 3.4* 3.4*    Cardiac Enzymes: Recent Labs  Lab 04/11/24 1749  CKTOTAL 57    CBG: Recent Labs  Lab 04/11/24 2003  GLUCAP 106*    Recent Results (from the past 240 hours)  Resp panel by RT-PCR (RSV, Flu A&B, Covid) Anterior Nasal Swab     Status: None   Collection Time: 04/11/24  6:25 PM   Specimen: Anterior Nasal Swab  Result Value Ref Range Status   SARS Coronavirus 2 by RT PCR NEGATIVE NEGATIVE Final    Comment: (NOTE) SARS-CoV-2 target nucleic acids are NOT DETECTED.  The SARS-CoV-2 RNA is generally detectable in upper respiratory specimens during the acute phase of infection. The lowest concentration of SARS-CoV-2 viral copies this assay can detect is 138 copies/mL. A negative result does not preclude SARS-Cov-2 infection and should not be used as the sole basis for treatment or other patient management decisions. A negative  result may occur with  improper specimen collection/handling, submission of specimen other than nasopharyngeal swab, presence of viral mutation(s) within the areas targeted by this assay, and inadequate number of viral copies(<138 copies/mL). A negative result must be combined with clinical observations, patient history, and epidemiological information. The expected result is Negative.  Fact Sheet for Patients:  BloggerCourse.com  Fact Sheet for Healthcare Providers:  SeriousBroker.it  This test is no t yet approved or cleared by the [place]  FDA and  has been authorized for detection and/or diagnosis of SARS-CoV-2 by FDA under an Emergency Use Authorization (EUA). This EUA will remain  in effect (meaning this test can be used) for the duration of the COVID-19 declaration under Section 564(b)(1) of the Act, 21 U.S.C.section 360bbb-3(b)(1), unless the authorization is terminated  or revoked sooner.       Influenza A by PCR NEGATIVE NEGATIVE Final   Influenza B by PCR NEGATIVE NEGATIVE Final    Comment: (NOTE) The Xpert Xpress SARS-CoV-2/FLU/RSV plus assay is intended as an aid in the diagnosis of influenza from Nasopharyngeal swab specimens and should not be used as a sole basis for treatment. Nasal washings and aspirates are unacceptable for Xpert Xpress SARS-CoV-2/FLU/RSV testing.  Fact Sheet for Patients: BloggerCourse.com  Fact Sheet for Healthcare Providers: SeriousBroker.it  This test is not yet approved or cleared by the [place]  FDA and has been authorized for detection and/or diagnosis of SARS-CoV-2 by FDA under an Emergency Use Authorization (EUA). This EUA will remain in effect (meaning this test can be used) for the duration of the COVID-19 declaration under Section 564(b)(1) of the Act, 21 U.S.C. section 360bbb-3(b)(1), unless the authorization is  terminated or revoked.     Resp Syncytial Virus by PCR NEGATIVE NEGATIVE Final    Comment: (NOTE) Fact Sheet for Patients: BloggerCourse.com  Fact Sheet for Healthcare Providers: SeriousBroker.it  This test is not yet approved or cleared by the [place]  FDA and has been authorized for detection and/or diagnosis of SARS-CoV-2 by FDA under an Emergency Use Authorization (EUA). This EUA will remain in effect (meaning this test can be used) for the duration of the COVID-19  declaration under Section 564(b)(1) of the Act, 21 U.S.C. section 360bbb-3(b)(1), unless the authorization is terminated or revoked.  Performed at [facility], [address] [place], [place] [address]   Blood Culture (routine x 2)     Status: None (Preliminary result)   Collection Time: 04/11/24  8:15 PM   Specimen: Left Antecubital; Blood  Result Value Ref Range Status   Specimen Description LEFT ANTECUBITAL  Final   Special Requests   Final    BOTTLES DRAWN AEROBIC AND ANAEROBIC Blood Culture adequate volume   Culture   Final    NO GROWTH 3 DAYS Performed at [facility], [address], [place], [place] [address]    Report Status PENDING  Incomplete  Blood Culture (routine x 2)     Status: None (Preliminary result)   Collection Time: 04/11/24  8:42 PM   Specimen: BLOOD  Result Value Ref Range Status   Specimen Description BLOOD BLOOD RIGHT ARM  Final   Special Requests NONE  Final   Culture   Final    NO GROWTH 3 DAYS Performed at [facility], [address], [place], [place] [address]    Report Status PENDING  Incomplete  Respiratory (~20 pathogens) panel by PCR     Status: None   Collection Time: 04/12/24  3:21 AM   Specimen: Nasopharyngeal Swab; Respiratory  Result Value Ref Range Status   Adenovirus NOT DETECTED NOT DETECTED Final   Coronavirus 229E NOT DETECTED NOT DETECTED Final    Comment: (NOTE) The Coronavirus on the Respiratory  Panel, DOES NOT test for the novel  Coronavirus (2019 nCoV)    Coronavirus HKU1 NOT DETECTED NOT DETECTED Final   Coronavirus NL63 NOT DETECTED NOT DETECTED Final   Coronavirus OC43 NOT DETECTED NOT DETECTED Final   Metapneumovirus NOT DETECTED NOT DETECTED Final   Rhinovirus / Enterovirus NOT DETECTED NOT DETECTED Final   Influenza A NOT DETECTED NOT DETECTED Final   Influenza B NOT DETECTED NOT DETECTED Final   Parainfluenza Virus 1 NOT DETECTED NOT DETECTED Final   Parainfluenza Virus 2 NOT DETECTED NOT DETECTED Final   Parainfluenza Virus 3 NOT DETECTED NOT DETECTED Final   Parainfluenza Virus 4 NOT DETECTED NOT DETECTED Final   Respiratory Syncytial Virus NOT DETECTED NOT DETECTED Final   Bordetella pertussis NOT DETECTED NOT DETECTED Final   Bordetella Parapertussis NOT DETECTED NOT DETECTED Final   Chlamydophila pneumoniae NOT DETECTED NOT DETECTED Final   Mycoplasma pneumoniae NOT DETECTED NOT DETECTED Final    Comment: Performed at [facility], [address], [place], [place] [address]  MRSA Next Gen by PCR, Nasal     Status: None   Collection Time: 04/12/24 11:52 AM   Specimen: Nasal Mucosa; Nasal Swab  Result Value Ref Range Status   MRSA by PCR Next Gen NOT DETECTED NOT DETECTED Final    Comment: (NOTE) The GeneXpert MRSA Assay (FDA approved for NASAL specimens only), is one component of a comprehensive MRSA colonization surveillance program. It is not intended to diagnose MRSA infection nor to guide or monitor treatment for MRSA infections. Test performance is not FDA approved in patients less than 29 years old. Performed at [facility], [address], [place], [place] [address]       Radiology Studies: No results found.    LOS: 3 days   Anton Cheramie [employer] on [email or web address]  04/14/2024, 9:51 AM

## 2024-04-14 NOTE — Progress Notes (Signed)
 Physical Therapy Treatment Patient Details Name: [name] MRN: [id number] DOB: [birth year] Today's Date: 04/14/2024   History of Present Illness Hx limited by somnolence.   [name] is a 73 y.o. female with hx of CAD, carotid artery disease with severe RICA stenosis s/p R-CEA (12/'24), renal artery stenosis, IPF, CKD stage IIIb, hx HTN although more recent chronic/orthostatic hypotension on midodrine , HLD, hypothyroidism, rheumatoid arthritis, GERD, fibromyalgia, who presented with acute onset of chest tightness and pleuritic chest pain. Reports onset yesterday morning upon awakening. Other than this she cannot really tell me much, frequently falling asleep. Does acknowledge constipation. Unclear if having dysphagia. + diffuse myalgias.    PT Comments  Pt was agreeable to completing today's PT treatment. She was received sitting in the chair with 2L O2 with her SpO2 at 98%. Today's treatment focused on ambulation and functional mobility. She was able to ambulate short distances without an AD as she was able to walk from the chair to the bathroom with supervision. For longer distances, she required the use of a [facility] for safety with supervision. Her SpO2 varied between 88-92% throughout today's treatment while on RA. However, cueing for pursed lip and diaphragmatic breathing was able to improve her saturation levels when they did begin to decline. She was asymptomatic throughout treatment. She was left in the chair with the call bell within reach and RN present. Patient will benefit from continued skilled physical therapy in hospital and recommended venue below to increase strength, balance, endurance for safe ADLs and gait.     If plan is discharge home, recommend the following: A little help with walking and/or transfers;A little help with bathing/dressing/bathroom;Assistance with cooking/housework;Assist for transportation;Help with stairs or ramp for entrance   Can travel by private vehicle         Equipment Recommendations  None recommended by PT    Recommendations for Other Services       Precautions / Restrictions Precautions Precautions: Fall Recall of Precautions/Restrictions: Intact Restrictions Weight Bearing Restrictions Per Provider Order: No     Mobility  Bed Mobility                    Transfers Overall transfer level: Needs assistance Equipment used: Straight cane Transfers: Sit to/from Stand Sit to Stand: Supervision                Ambulation/Gait Ambulation/Gait assistance: Supervision Gait Distance (Feet): 180 Feet Assistive device: Straight cane Gait Pattern/deviations: Step-through pattern, Decreased stride length Gait velocity: decreased         Stairs             Wheelchair Mobility     Tilt Bed    Modified Rankin (Stroke Patients Only)       Balance Overall balance assessment: Mild deficits observed, not formally tested                                          Communication Communication Communication: No apparent difficulties  Cognition Arousal: Alert Behavior During Therapy: WFL for tasks assessed/performed   PT - Cognitive impairments: No apparent impairments                         Following commands: Intact      Cueing Cueing Techniques: Verbal cues  Exercises      General Comments  Pertinent Vitals/Pain Pain Assessment Pain Assessment: 0-10 Pain Score: 6  Pain Location: neck, spine, and knees Pain Descriptors / Indicators: Sharp Pain Intervention(s): Monitored during session, Repositioned    Home Living                          Prior Function            PT Goals (current goals can now be found in the care plan section) Acute Rehab PT Goals Patient Stated Goal: return to her ALF with HHPT PT Goal Formulation: With patient Time For Goal Achievement: 04/22/24 Potential to Achieve Goals: Good Progress towards PT goals:  Progressing toward goals    Frequency    Min 3X/week      PT Plan      Co-evaluation              AM-PAC PT 6 Clicks Mobility   Outcome Measure  Help needed turning from your back to your side while in a flat bed without using bedrails?: None Help needed moving from lying on your back to sitting on the side of a flat bed without using bedrails?: None Help needed moving to and from a bed to a chair (including a wheelchair)?: None Help needed standing up from a chair using your arms (e.g., wheelchair or bedside chair)?: None Help needed to walk in hospital room?: A Little Help needed climbing 3-5 steps with a railing? : A Little 6 Click Score: 22    End of Session   Activity Tolerance: Patient tolerated treatment well Patient left: in chair;with call bell/phone within reach;with nursing/sitter in room Nurse Communication: Mobility status;Other (comment) (vitals throughout mobility) PT Visit Diagnosis: Muscle weakness (generalized) (M62.81);Other abnormalities of gait and mobility (R26.89);Difficulty in walking, not elsewhere classified (R26.2)     Time: 9055-8987 PT Time Calculation (min) (ACUTE ONLY): 28 min  Charges:    $Therapeutic Activity: 23-37 mins PT General Charges $$ ACUTE PT VISIT: 1 Visit                     Lacinda Fass, PT, DPT  04/14/2024, 11:44 AM

## 2024-04-14 NOTE — Plan of Care (Signed)

## 2024-04-14 NOTE — Progress Notes (Signed)
 SATURATION QUALIFICATIONS: (This note is used to comply with regulatory documentation for home oxygen)  Patient Saturations on Room Air at Rest = 96%  Patient Saturations on Room Air while Ambulating = 90%  Patient Saturations on 2 Liters of oxygen while Ambulating = 96%   Pt tolerated ambulation in hallway with mobility tech and FWW well.

## 2024-04-15 DIAGNOSIS — J9601 Acute respiratory failure with hypoxia: Secondary | ICD-10-CM | POA: Diagnosis not present

## 2024-04-15 DIAGNOSIS — J181 Lobar pneumonia, unspecified organism: Secondary | ICD-10-CM | POA: Diagnosis not present

## 2024-04-15 DIAGNOSIS — E669 Obesity, unspecified: Secondary | ICD-10-CM | POA: Diagnosis not present

## 2024-04-15 DIAGNOSIS — J84112 Idiopathic pulmonary fibrosis: Secondary | ICD-10-CM | POA: Diagnosis not present

## 2024-04-15 LAB — CBC
HCT: 28.5 % — ABNORMAL LOW (ref 36.0–46.0)
Hemoglobin: 9 g/dL — ABNORMAL LOW (ref 12.0–15.0)
MCH: 31.3 pg (ref 26.0–34.0)
MCHC: 31.6 g/dL (ref 30.0–36.0)
MCV: 99 fL (ref 80.0–100.0)
Platelets: 172 K/uL (ref 150–400)
RBC: 2.88 MIL/uL — ABNORMAL LOW (ref 3.87–5.11)
RDW: 13.9 % (ref 11.5–15.5)
WBC: 6.3 K/uL (ref 4.0–10.5)
nRBC: 0 % (ref 0.0–0.2)

## 2024-04-15 LAB — RETICULOCYTES
Immature Retic Fract: 18.9 % — ABNORMAL HIGH (ref 2.3–15.9)
RBC.: 2.88 MIL/uL — ABNORMAL LOW (ref 3.87–5.11)
Retic Count, Absolute: 41.5 K/uL (ref 19.0–186.0)
Retic Ct Pct: 1.4 % (ref 0.4–3.1)

## 2024-04-15 LAB — COMPREHENSIVE METABOLIC PANEL WITH GFR
ALT: 8 U/L (ref 0–44)
AST: 15 U/L (ref 15–41)
Albumin: 3.5 g/dL (ref 3.5–5.0)
Alkaline Phosphatase: 70 U/L (ref 38–126)
Anion gap: 12 (ref 5–15)
BUN: 17 mg/dL (ref 8–23)
CO2: 22 mmol/L (ref 22–32)
Calcium: 9 mg/dL (ref 8.9–10.3)
Chloride: 104 mmol/L (ref 98–111)
Creatinine, Ser: 1.34 mg/dL — ABNORMAL HIGH (ref 0.44–1.00)
GFR, Estimated: 41 mL/min — ABNORMAL LOW (ref >60)
Glucose, Bld: 90 mg/dL (ref 70–99)
Potassium: 4.4 mmol/L (ref 3.5–5.1)
Sodium: 139 mmol/L (ref 135–145)
Total Bilirubin: 0.4 mg/dL (ref 0.0–1.2)
Total Protein: 6.6 g/dL (ref 6.5–8.1)

## 2024-04-15 LAB — IRON AND TIBC
Iron: 22 ug/dL — ABNORMAL LOW (ref 28–170)
Saturation Ratios: 9 % — ABNORMAL LOW (ref 10.4–31.8)
TIBC: 242 ug/dL — ABNORMAL LOW (ref 250–450)
UIBC: 221 ug/dL

## 2024-04-15 LAB — PROCALCITONIN: Procalcitonin: <0.1 ng/mL

## 2024-04-15 LAB — FOLATE: Folate: 12.3 ng/mL (ref >5.9)

## 2024-04-15 LAB — FERRITIN: Ferritin: 226 ng/mL (ref 11–307)

## 2024-04-15 LAB — PRO BRAIN NATRIURETIC PEPTIDE: Pro Brain Natriuretic Peptide: 5566 pg/mL — ABNORMAL HIGH (ref <300.0)

## 2024-04-15 LAB — VITAMIN B12: Vitamin B-12: 991 pg/mL — ABNORMAL HIGH (ref 180–914)

## 2024-04-15 MED ORDER — FUROSEMIDE 10 MG/ML IJ SOLN
40.0000 mg | Freq: Once | INTRAMUSCULAR | Status: AC
  Administered 2024-04-15: 40 mg via INTRAVENOUS
  Filled 2024-04-15: qty 4

## 2024-04-15 MED ORDER — DOXYCYCLINE HYCLATE 100 MG PO TABS
100.0000 mg | ORAL_TABLET | Freq: Two times a day (BID) | ORAL | Status: DC
  Administered 2024-04-15 – 2024-04-18 (×7): 100 mg via ORAL
  Filled 2024-04-15 (×7): qty 1

## 2024-04-15 MED ORDER — SODIUM CHLORIDE 0.9 % IV SOLN
1.0000 g | INTRAVENOUS | Status: DC
  Administered 2024-04-15 – 2024-04-18 (×4): 1 g via INTRAVENOUS
  Filled 2024-04-15 (×4): qty 10

## 2024-04-15 NOTE — Progress Notes (Addendum)
 SATURATION QUALIFICATIONS: (This note is used to comply with regulatory documentation for home oxygen)  Patient Saturations on Room Air at Rest = 90%  Patient Saturations on Room Air while Ambulating = 88%  Please briefly explain why patient needs home oxygen: Patient has complaints of shortness of breath without oxygen. Instructed to take deep breaths when her oxygen dropped and that brought her back up tp 91% on RA.

## 2024-04-15 NOTE — Progress Notes (Signed)
 PROGRESS NOTE  [name] FMW:[id number] DOB: 1951-04-03 DOA: 04/11/2024 PCP: [name], MD  Brief History78  73 y/o female with a history of CAD, carotid artery disease with severe RICA stenosis s/p R-CEA (12//20/2024), IPF, HTN, renal artery stenosis, GERD, fibromyalgia, rheumatoid arthritis, hypothyroidism, CKD stage IIIb, HFpEFand HLD presenting diffuse pain body particularly headache, neck pain, chest pain, abdominal pain.  She also complained of some shortness of breath.  The patient resides at [facility].  She states that she was recently given a course of antibiotics, Augmentin for which she felt to be pneumonia. She denied any nausea, vomiting or diarrhea, abdominal pain.  She has nonproductive cough. The patient has had recent hospitalizations from 10/20/2023 to 10/26/2023 for acute on chronic HFpEF. She was discharged to SNF with torsemide  40 mg daily.  She was subsequently admitted from 11/24/2023 to 11/27/2023 secondary to acute on chronic renal failure, CKD stage IIIb as well as vertigo and dizziness.  It was felt that her vertigo and dizziness was multifactorial including orthostatic hypotension, volume depletion, BPPV.  She was discharged on midodrine .  In the ED, the patient was hypoxic in the 80s.  She was tachypneic.  He was hemodynamically stable.  WBC 9.8, hemoglobin 11.8, plates 837.  CTA chest was negative for PE but showed consolidative opacity in the right lower lobe with a small right effusion.  There was shotty mediastinal lymphadenopathy.  CT of the abdomen and pelvis showed abundant stool, but there was no other acute findings.  CT of the brain was negative for acute findings.  CT of the cervical spine was negative for any fracture or dislocation.  The patient was started on ceftriaxone  and azithromycin.    Assessment/Plan: Acute respiratory failure with hypoxia - Secondary to pneumonia - Presented with tachypnea and hypoxia - Stable on 2 L - Wean oxygen  as tolerated  Lobar pneumonia - CTA chest as discussed above - Continue ceftriaxone  and azithromycin  Acute on chronic HFpEF - The patient has some signs of fluid overload - Start IV furosemide  - Accurate I's and O's - Daily weights - 10/21/2023 echo EF 65 to 70%, no WMA, normal RVF, trivial MR  CKD stage IIIb - Baseline creatinine 1.4-1.7 - Monitor with diuresis  Abdominal pain - CT abdomen pelvis as discussed above - Secondary to constipation - Continue cathartics  Pulmonary fibrosis Noted on CT angiogram.  Outpatient follow-up with pulmonology.  Hypothyroidism - Continue Synthroid    Coronary Artery Disease - No chest pain presently - Continue statin   Peripheral arterial disease -s/p R-CEA 06/2023 -continue ASA, plavix , statin   Orthostatic hypotension - Continue midodrine  - As needed meclizine   Mixed hyperlipidemia - Continue statin   Rheumatoid arthritis - No active flare presently - Currently not on any DMARDs   Depression/anxiety - Continue home dose alprazolam  - PDMP reviewed--alprazolam , #71, last refill 03/30/2024  Obesity -BMI 31.21 -Lifestyle modification          Family Communication: no  Family at bedside  Consultants:  none  Code Status:  FULL   DVT Prophylaxis:  [place] Heparin    Procedures: As Listed in Progress Note Above  Antibiotics: Ceftriaxone  9/29, 10/3>> Azithro 9/29 -Unasyn  9/30>>10/2      Subjective: Patient continues shortness of breath.  She complains of pain all over.  She denies any nausea, vomiting or diarrhea.  Objective: Vitals:   04/14/24 1959 04/14/24 2026 04/15/24 0450 04/15/24 0717  BP: ROLLEN)  111/52  (!) 110/53 (!) 102/44  Pulse: 80  80 81  Resp: 18  20 18   Temp: 98.7 F (37.1 C)  98.6 F (37 C) 98.3 F (36.8 C)  TempSrc: Oral  Oral Oral  SpO2: 97% 94% 90% 94%  Weight:   70.1 kg   Height:        Intake/Output Summary (Last 24 hours) at 04/15/2024 1205 Last data filed at 04/15/2024  0940 Gross per 24 hour  Intake 440 ml  Output --  Net 440 ml   Weight change: -1 kg Exam:  General:  Pt is alert, follows commands appropriately, not in acute distress HEENT: No icterus, No thrush, No neck mass, [place]/AT Cardiovascular: RRR, S1/S2, no rubs, no gallops +JVD Respiratory: Bilateral crackles.  There is no wheeze.  Diminished breath sounds. Abdomen: Soft/+BS, non tender, non distended, no guarding Extremities: 1 + LE edema, No lymphangitis, No petechiae, No rashes, no synovitis   Data Reviewed: I have personally reviewed following labs and imaging studies Basic Metabolic Panel: Recent Labs  Lab 04/11/24 1749 04/12/24 0457 04/13/24 0424 04/14/24 0420 04/15/24 0424  NA 136 138 141 139 139  K 3.7 3.4* 4.1 4.5 4.4  CL 96* 102 106 105 104  CO2 25 25 22 23 22   GLUCOSE 103* 87 108* 106* 90  BUN 21 20 17 16 17   CREATININE 1.32* 1.29* 1.34* 1.29* 1.34*  CALCIUM  8.7* 8.4* 8.8* 8.8* 9.0  MG 2.1 2.1  --   --   --   PHOS  --  4.4  --   --   --    Liver Function Tests: Recent Labs  Lab 04/11/24 1749 04/13/24 0424 04/14/24 0420 04/15/24 0424  AST 16 14* 15 15  ALT 8 7 7 8   ALKPHOS 73 67 67 70  BILITOT 0.8 0.2 0.3 0.4  PROT 7.5 6.3* 6.4* 6.6  ALBUMIN  3.5 3.4* 3.4* 3.5   Recent Labs  Lab 04/13/24 0424  LIPASE 18   No results for input(s): AMMONIA in the last 168 hours. Coagulation Profile: No results for input(s): INR, PROTIME in the last 168 hours. CBC: Recent Labs  Lab 04/11/24 1749 04/12/24 0457 04/14/24 0420 04/15/24 0424  WBC 9.8 6.5 7.8 6.3  NEUTROABS 8.0*  --   --   --   HGB 11.8* 9.4* 8.7* 9.0*  HCT 36.7 29.1* 27.6* 28.5*  MCV 97.3 99.0 99.3 99.0  PLT 189 162 165 172   Cardiac Enzymes: Recent Labs  Lab 04/11/24 1749  CKTOTAL 57   BNP: Invalid input(s): POCBNP CBG: Recent Labs  Lab 04/11/24 2003  GLUCAP 106*   HbA1C: No results for input(s): HGBA1C in the last 72 hours. Urine analysis:    Component Value Date/Time    COLORURINE STRAW (A) 04/12/2024 0004   APPEARANCEUR CLEAR 04/12/2024 0004   APPEARANCEUR Clear 10/14/2021 1419   LABSPEC 1.020 04/12/2024 0004   PHURINE 7.0 04/12/2024 0004   GLUCOSEU >=500 (A) 04/12/2024 0004   HGBUR NEGATIVE 04/12/2024 0004   BILIRUBINUR NEGATIVE 04/12/2024 0004   BILIRUBINUR Negative 10/14/2021 1419   KETONESUR NEGATIVE 04/12/2024 0004   PROTEINUR NEGATIVE 04/12/2024 0004   UROBILINOGEN 0.2 02/06/2014 1816   NITRITE NEGATIVE 04/12/2024 0004   LEUKOCYTESUR NEGATIVE 04/12/2024 0004   Sepsis Labs: @LABRCNTIP (procalcitonin:4,lacticidven:4) ) Recent Results (from the past 240 hours)  Resp panel by RT-PCR (RSV, Flu A&B, Covid) Anterior Nasal Swab     Status: None   Collection Time: 04/11/24  6:25 PM   Specimen: Anterior Nasal  Swab  Result Value Ref Range Status   SARS Coronavirus 2 by RT PCR NEGATIVE NEGATIVE Final    Comment: (NOTE) SARS-CoV-2 target nucleic acids are NOT DETECTED.  The SARS-CoV-2 RNA is generally detectable in upper respiratory specimens during the acute phase of infection. The lowest concentration of SARS-CoV-2 viral copies this assay can detect is 138 copies/mL. A negative result does not preclude SARS-Cov-2 infection and should not be used as the sole basis for treatment or other patient management decisions. A negative result may occur with  improper specimen collection/handling, submission of specimen other than nasopharyngeal swab, presence of viral mutation(s) within the areas targeted by this assay, and inadequate number of viral copies(<138 copies/mL). A negative result must be combined with clinical observations, patient history, and epidemiological information. The expected result is Negative.  Fact Sheet for Patients:  BloggerCourse.com  Fact Sheet for Healthcare Providers:  SeriousBroker.it  This test is no t yet approved or cleared by the [place]  FDA and  has been  authorized for detection and/or diagnosis of SARS-CoV-2 by FDA under an Emergency Use Authorization (EUA). This EUA will remain  in effect (meaning this test can be used) for the duration of the COVID-19 declaration under Section 564(b)(1) of the Act, 21 U.S.C.section 360bbb-3(b)(1), unless the authorization is terminated  or revoked sooner.       Influenza A by PCR NEGATIVE NEGATIVE Final   Influenza B by PCR NEGATIVE NEGATIVE Final    Comment: (NOTE) The Xpert Xpress SARS-CoV-2/FLU/RSV plus assay is intended as an aid in the diagnosis of influenza from Nasopharyngeal swab specimens and should not be used as a sole basis for treatment. Nasal washings and aspirates are unacceptable for Xpert Xpress SARS-CoV-2/FLU/RSV testing.  Fact Sheet for Patients: BloggerCourse.com  Fact Sheet for Healthcare Providers: SeriousBroker.it  This test is not yet approved or cleared by the [place]  FDA and has been authorized for detection and/or diagnosis of SARS-CoV-2 by FDA under an Emergency Use Authorization (EUA). This EUA will remain in effect (meaning this test can be used) for the duration of the COVID-19 declaration under Section 564(b)(1) of the Act, 21 U.S.C. section 360bbb-3(b)(1), unless the authorization is terminated or revoked.     Resp Syncytial Virus by PCR NEGATIVE NEGATIVE Final    Comment: (NOTE) Fact Sheet for Patients: BloggerCourse.com  Fact Sheet for Healthcare Providers: SeriousBroker.it  This test is not yet approved or cleared by the [place]  FDA and has been authorized for detection and/or diagnosis of SARS-CoV-2 by FDA under an Emergency Use Authorization (EUA). This EUA will remain in effect (meaning this test can be used) for the duration of the COVID-19 declaration under Section 564(b)(1) of the Act, 21 U.S.C. section 360bbb-3(b)(1), unless the  authorization is terminated or revoked.  Performed at [facility], [address], [place], [place] [address]   Blood Culture (routine x 2)     Status: None (Preliminary result)   Collection Time: 04/11/24  8:15 PM   Specimen: Left Antecubital; Blood  Result Value Ref Range Status   Specimen Description LEFT ANTECUBITAL  Final   Special Requests   Final    BOTTLES DRAWN AEROBIC AND ANAEROBIC Blood Culture adequate volume   Culture   Final    NO GROWTH 4 DAYS Performed at [facility], [address], [place], [place] [address]    Report Status PENDING  Incomplete  Blood Culture (routine x 2)     Status: None (Preliminary result)   Collection Time: 04/11/24  8:42 PM   Specimen: BLOOD  Result Value Ref Range Status   Specimen Description BLOOD BLOOD RIGHT ARM  Final   Special Requests NONE  Final   Culture   Final    NO GROWTH 4 DAYS Performed at [facility], [address] [place], [place] [address]    Report Status PENDING  Incomplete  Respiratory (~20 pathogens) panel by PCR     Status: None   Collection Time: 04/12/24  3:21 AM   Specimen: Nasopharyngeal Swab; Respiratory  Result Value Ref Range Status   Adenovirus NOT DETECTED NOT DETECTED Final   Coronavirus 229E NOT DETECTED NOT DETECTED Final    Comment: (NOTE) The Coronavirus on the Respiratory Panel, DOES NOT test for the novel  Coronavirus (2019 nCoV)    Coronavirus HKU1 NOT DETECTED NOT DETECTED Final   Coronavirus NL63 NOT DETECTED NOT DETECTED Final   Coronavirus OC43 NOT DETECTED NOT DETECTED Final   Metapneumovirus NOT DETECTED NOT DETECTED Final   Rhinovirus / Enterovirus NOT DETECTED NOT DETECTED Final   Influenza A NOT DETECTED NOT DETECTED Final   Influenza B NOT DETECTED NOT DETECTED Final   Parainfluenza Virus 1 NOT DETECTED NOT DETECTED Final   Parainfluenza Virus 2 NOT DETECTED NOT DETECTED Final   Parainfluenza Virus 3 NOT DETECTED NOT DETECTED Final   Parainfluenza Virus 4 NOT DETECTED NOT  DETECTED Final   Respiratory Syncytial Virus NOT DETECTED NOT DETECTED Final   Bordetella pertussis NOT DETECTED NOT DETECTED Final   Bordetella Parapertussis NOT DETECTED NOT DETECTED Final   Chlamydophila pneumoniae NOT DETECTED NOT DETECTED Final   Mycoplasma pneumoniae NOT DETECTED NOT DETECTED Final    Comment: Performed at [facility], [address], [place], [place] [address]  MRSA Next Gen by PCR, Nasal     Status: None   Collection Time: 04/12/24 11:52 AM   Specimen: Nasal Mucosa; Nasal Swab  Result Value Ref Range Status   MRSA by PCR Next Gen NOT DETECTED NOT DETECTED Final    Comment: (NOTE) The GeneXpert MRSA Assay (FDA approved for NASAL specimens only), is one component of a comprehensive MRSA colonization surveillance program. It is not intended to diagnose MRSA infection nor to guide or monitor treatment for MRSA infections. Test performance is not FDA approved in patients less than 14 years old. Performed at [facility], [address] [place], [place] [address]      Scheduled Meds:  albuterol   2.5 mg Nebulization BID   ALPRAZolam   0.5 mg Oral BID   aspirin  EC  81 mg Oral Daily   clopidogrel   75 mg Oral Daily   escitalopram  10 mg Oral Daily   furosemide   40 mg Intravenous Once   guaiFENesin   600 mg Oral BID   heparin   5,000 Units Subcutaneous Q8H   levothyroxine   25 mcg Oral QAC breakfast   midodrine   5 mg Oral TID WC   mineral oil  1 enema Rectal Once   pantoprazole   40 mg Oral Daily   polyethylene glycol  17 g Oral BID   rosuvastatin   40 mg Oral Daily   senna-docusate  2 tablet Oral BID   sodium chloride  flush  3 mL Intravenous Q12H   traZODone   100 mg Oral QHS   Continuous Infusions:  ampicillin -sulbactam (UNASYN ) IV 3 g (04/15/24 0943)    Procedures/Studies: CT Angio Chest PE W and/or Wo Contrast Result Date: 04/11/2024 CLINICAL DATA:  PE suspected, chest and abdominal pain EXAM: CT ANGIOGRAPHY CHEST  CT ABDOMEN AND PELVIS WITH CONTRAST  TECHNIQUE: Multidetector CT imaging of the chest was performed using the standard protocol during bolus administration of intravenous contrast. Multiplanar CT image reconstructions and MIPs were obtained to evaluate the vascular anatomy. Multidetector CT imaging of the abdomen and pelvis was performed using the standard protocol during bolus administration of intravenous contrast. RADIATION DOSE REDUCTION: This exam was performed according to the departmental dose-optimization program which includes automated exposure control, adjustment of the mA and/or kV according to patient size and/or use of iterative reconstruction technique. CONTRAST:  75mL OMNIPAQUE  IOHEXOL  350 MG/ML SOLN COMPARISON:  CT chest angiogram, 10/22/2023 FINDINGS: CT CHEST ANGIOGRAM FINDINGS Cardiovascular: Satisfactory opacification of the pulmonary arteries to the segmental level. No evidence of pulmonary embolism. Normal heart size. Extensive three-vessel coronary artery calcifications. No pericardial effusion. Severe aortic atherosclerosis. Mediastinum/Nodes: Numerous enlarged mediastinal and bilateral hilar lymph nodes, pretracheal nodes measuring up to 1.6 x 1.5 cm (series 8, image 66) thyroid  gland, trachea, and esophagus demonstrate no significant findings. Lungs/Pleura: Heterogeneous and consolidative airspace opacity in the right lung base with a small associated right pleural effusion (series 9, image 71). Unchanged mild bibasilar predominant pulmonary fibrosis. Minimal paraseptal emphysema. Musculoskeletal: No chest wall abnormality. No acute osseous findings. Review of the MIP images confirms the above findings. CT ABDOMEN PELVIS FINDINGS Hepatobiliary: No focal liver abnormality is seen. Status post cholecystectomy. Postoperative biliary dilatation. Pancreas: Unremarkable. No pancreatic ductal dilatation or surrounding inflammatory changes. Spleen: Normal in size without significant abnormality. Adrenals/Urinary Tract: Adrenal glands  are unremarkable. Simple, benign bilateral renal cortical cysts for which no further follow-up or characterization is required. Kidneys are otherwise normal, without renal calculi, solid lesion, or hydronephrosis. Bladder is unremarkable. Stomach/Bowel: Stomach is within normal limits. Appendix not clearly visualized. No evidence of bowel wall thickening, distention, or inflammatory changes. Large burden of stool and stool balls throughout the colon and rectum Vascular/Lymphatic: Severe aortic atherosclerosis. No enlarged abdominal or pelvic lymph nodes. Reproductive: Hysterectomy. Other: No abdominal wall hernia or abnormality. No ascites. Musculoskeletal: No acute or significant osseous findings. IMPRESSION: 1. Negative examination for pulmonary embolism. 2. Heterogeneous and consolidative airspace opacity in the right lung base with a small associated right pleural effusion, consistent with infection or aspiration. 3. Numerous enlarged mediastinal and bilateral hilar lymph nodes, likely reactive. 4. 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 5. Large burden of stool and stool balls throughout the colon and rectum. 6. Coronary artery disease. Aortic Atherosclerosis (ICD10-I70.0) and Emphysema (ICD10-J43.9). Electronically Signed   By: Marolyn JONETTA Jaksch M.D.   On: 04/11/2024 21:49   CT ABDOMEN PELVIS W CONTRAST Result Date: 04/11/2024 CLINICAL DATA:  PE suspected, chest and abdominal pain EXAM: CT ANGIOGRAPHY CHEST CT ABDOMEN AND PELVIS WITH CONTRAST TECHNIQUE: Multidetector CT imaging of the chest was performed using the standard protocol during bolus administration of intravenous contrast. Multiplanar CT image reconstructions and MIPs were obtained to evaluate the vascular anatomy. Multidetector CT imaging of the abdomen and pelvis was performed using the standard protocol during bolus administration of intravenous contrast. RADIATION DOSE  REDUCTION: This exam was performed according to the departmental dose-optimization program which includes automated exposure control, adjustment of the mA and/or kV according to patient size and/or use of iterative reconstruction technique. CONTRAST:  75mL OMNIPAQUE  IOHEXOL  350 MG/ML SOLN COMPARISON:  CT chest angiogram, 10/22/2023 FINDINGS: CT CHEST ANGIOGRAM FINDINGS Cardiovascular: Satisfactory opacification of the pulmonary arteries to the segmental level. No evidence of pulmonary embolism. Normal heart size. Extensive  three-vessel coronary artery calcifications. No pericardial effusion. Severe aortic atherosclerosis. Mediastinum/Nodes: Numerous enlarged mediastinal and bilateral hilar lymph nodes, pretracheal nodes measuring up to 1.6 x 1.5 cm (series 8, image 66) thyroid  gland, trachea, and esophagus demonstrate no significant findings. Lungs/Pleura: Heterogeneous and consolidative airspace opacity in the right lung base with a small associated right pleural effusion (series 9, image 71). Unchanged mild bibasilar predominant pulmonary fibrosis. Minimal paraseptal emphysema. Musculoskeletal: No chest wall abnormality. No acute osseous findings. Review of the MIP images confirms the above findings. CT ABDOMEN PELVIS FINDINGS Hepatobiliary: No focal liver abnormality is seen. Status post cholecystectomy. Postoperative biliary dilatation. Pancreas: Unremarkable. No pancreatic ductal dilatation or surrounding inflammatory changes. Spleen: Normal in size without significant abnormality. Adrenals/Urinary Tract: Adrenal glands are unremarkable. Simple, benign bilateral renal cortical cysts for which no further follow-up or characterization is required. Kidneys are otherwise normal, without renal calculi, solid lesion, or hydronephrosis. Bladder is unremarkable. Stomach/Bowel: Stomach is within normal limits. Appendix not clearly visualized. No evidence of bowel wall thickening, distention, or inflammatory changes.  Large burden of stool and stool balls throughout the colon and rectum Vascular/Lymphatic: Severe aortic atherosclerosis. No enlarged abdominal or pelvic lymph nodes. Reproductive: Hysterectomy. Other: No abdominal wall hernia or abnormality. No ascites. Musculoskeletal: No acute or significant osseous findings. IMPRESSION: 1. Negative examination for pulmonary embolism. 2. Heterogeneous and consolidative airspace opacity in the right lung base with a small associated right pleural effusion, consistent with infection or aspiration. 3. Numerous enlarged mediastinal and bilateral hilar lymph nodes, likely reactive. 4. 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 5. Large burden of stool and stool balls throughout the colon and rectum. 6. Coronary artery disease. Aortic Atherosclerosis (ICD10-I70.0) and Emphysema (ICD10-J43.9). Electronically Signed   By: Marolyn JONETTA Jaksch M.D.   On: 04/11/2024 21:49   CT Head Wo Contrast Result Date: 04/11/2024 CLINICAL DATA:  Head and neck pain, no known injury EXAM: CT HEAD WITHOUT CONTRAST CT CERVICAL SPINE WITHOUT CONTRAST TECHNIQUE: Multidetector CT imaging of the head and cervical spine was performed following the standard protocol without intravenous contrast. Multiplanar CT image reconstructions of the cervical spine were also generated. RADIATION DOSE REDUCTION: This exam was performed according to the departmental dose-optimization program which includes automated exposure control, adjustment of the mA and/or kV according to patient size and/or use of iterative reconstruction technique. COMPARISON:  MR brain, 11/26/2023 FINDINGS: CT HEAD FINDINGS Brain: No evidence of acute infarction, hemorrhage, hydrocephalus, extra-axial collection or mass lesion/mass effect. Periventricular and deep white matter hypodensity. Vascular: No hyperdense vessel or unexpected calcification. Skull: Normal. Negative for fracture or  focal lesion. Sinuses/Orbits: No acute finding. Other: None. CT CERVICAL SPINE FINDINGS Alignment: Postoperative straightening of the normal cervical lordosis. Skull base and vertebrae: No acute fracture. No primary bone lesion or focal pathologic process. Soft tissues and spinal canal: No prevertebral fluid or swelling. No visible canal hematoma. Disc levels: Anterior cervical discectomy and fusion of C4 through C7. Mild-to-moderate disc space height loss and osteophytosis of the remaining cervical levels. Upper chest: Negative. Other: None. IMPRESSION: 1. No acute intracranial pathology. 2. No fracture or static subluxation of the cervical spine. 3. Anterior cervical discectomy and fusion of C4 through C7. Mild-to-moderate disc space height loss and osteophytosis of the remaining cervical levels. Electronically Signed   By: Marolyn JONETTA Jaksch M.D.   On: 04/11/2024 21:42   CT Cervical Spine Wo Contrast Result Date: 04/11/2024 CLINICAL DATA:  Head and neck pain, no known injury EXAM: CT  HEAD WITHOUT CONTRAST CT CERVICAL SPINE WITHOUT CONTRAST TECHNIQUE: Multidetector CT imaging of the head and cervical spine was performed following the standard protocol without intravenous contrast. Multiplanar CT image reconstructions of the cervical spine were also generated. RADIATION DOSE REDUCTION: This exam was performed according to the departmental dose-optimization program which includes automated exposure control, adjustment of the mA and/or kV according to patient size and/or use of iterative reconstruction technique. COMPARISON:  MR brain, 11/26/2023 FINDINGS: CT HEAD FINDINGS Brain: No evidence of acute infarction, hemorrhage, hydrocephalus, extra-axial collection or mass lesion/mass effect. Periventricular and deep white matter hypodensity. Vascular: No hyperdense vessel or unexpected calcification. Skull: Normal. Negative for fracture or focal lesion. Sinuses/Orbits: No acute finding. Other: None. CT CERVICAL SPINE  FINDINGS Alignment: Postoperative straightening of the normal cervical lordosis. Skull base and vertebrae: No acute fracture. No primary bone lesion or focal pathologic process. Soft tissues and spinal canal: No prevertebral fluid or swelling. No visible canal hematoma. Disc levels: Anterior cervical discectomy and fusion of C4 through C7. Mild-to-moderate disc space height loss and osteophytosis of the remaining cervical levels. Upper chest: Negative. Other: None. IMPRESSION: 1. No acute intracranial pathology. 2. No fracture or static subluxation of the cervical spine. 3. Anterior cervical discectomy and fusion of C4 through C7. Mild-to-moderate disc space height loss and osteophytosis of the remaining cervical levels. Electronically Signed   By: Marolyn JONETTA Jaksch M.D.   On: 04/11/2024 21:42    Alm Schneider, DO  [facility]  If 7PM-7AM, please contact night-coverage [email or web address] Password TRH1 04/15/2024, 12:05 PM   LOS: 4 days

## 2024-04-15 NOTE — Care Management Important Message (Signed)
 Important Message  Patient Details  Name: [name] MRN: [id number] Date of Birth: [birth year]   Important Message Given:  Yes - Medicare IM     [name] 04/15/2024, 12:46 PM

## 2024-04-15 NOTE — Progress Notes (Signed)
 Mobility Specialist Progress Note:    04/15/24 0910  Mobility  Activity Ambulated with assistance  Level of Assistance Modified independent, requires aide device or extra time  Assistive Device Other (Comment) (IV pole)  Distance Ambulated (ft) 110 ft  Range of Motion/Exercises Active;All extremities  Activity Response Tolerated well  Mobility Referral Yes  Mobility visit 1 Mobility  Mobility Specialist Start Time (ACUTE ONLY) 0910  Mobility Specialist Stop Time (ACUTE ONLY) 0930  Mobility Specialist Time Calculation (min) (ACUTE ONLY) 20 min   Pt received in bed, agreeable to mobility. ModI to stand and ambulate using IV pole for support. Tolerated well, c/o some lightheadedness during ambulation. SpO2 90% on RA at rest, SpO2 88-91% on RA during ambulation. Left sitting EOB, all needs met.  Florabelle Cardin Mobility Specialist Please contact via Special educational needs teacher or  Rehab office at [phone]

## 2024-04-15 NOTE — Plan of Care (Signed)

## 2024-04-16 DIAGNOSIS — J181 Lobar pneumonia, unspecified organism: Secondary | ICD-10-CM | POA: Diagnosis not present

## 2024-04-16 DIAGNOSIS — I5033 Acute on chronic diastolic (congestive) heart failure: Secondary | ICD-10-CM

## 2024-04-16 DIAGNOSIS — E669 Obesity, unspecified: Secondary | ICD-10-CM | POA: Diagnosis not present

## 2024-04-16 LAB — CULTURE, BLOOD (ROUTINE X 2)
Culture: NO GROWTH
Culture: NO GROWTH
Special Requests: ADEQUATE

## 2024-04-16 LAB — CBC
HCT: 27.1 % — ABNORMAL LOW (ref 36.0–46.0)
Hemoglobin: 8.6 g/dL — ABNORMAL LOW (ref 12.0–15.0)
MCH: 31.4 pg (ref 26.0–34.0)
MCHC: 31.7 g/dL (ref 30.0–36.0)
MCV: 98.9 fL (ref 80.0–100.0)
Platelets: 179 K/uL (ref 150–400)
RBC: 2.74 MIL/uL — ABNORMAL LOW (ref 3.87–5.11)
RDW: 14.1 % (ref 11.5–15.5)
WBC: 5.6 K/uL (ref 4.0–10.5)
nRBC: 0 % (ref 0.0–0.2)

## 2024-04-16 LAB — BASIC METABOLIC PANEL WITH GFR
Anion gap: 12 (ref 5–15)
BUN: 22 mg/dL (ref 8–23)
CO2: 24 mmol/L (ref 22–32)
Calcium: 8.8 mg/dL — ABNORMAL LOW (ref 8.9–10.3)
Chloride: 103 mmol/L (ref 98–111)
Creatinine, Ser: 1.46 mg/dL — ABNORMAL HIGH (ref 0.44–1.00)
GFR, Estimated: 37 mL/min — ABNORMAL LOW (ref >60)
Glucose, Bld: 98 mg/dL (ref 70–99)
Potassium: 4.1 mmol/L (ref 3.5–5.1)
Sodium: 139 mmol/L (ref 135–145)

## 2024-04-16 LAB — MAGNESIUM: Magnesium: 2.4 mg/dL (ref 1.7–2.4)

## 2024-04-16 LAB — OCCULT BLOOD X 1 CARD TO LAB, STOOL: Fecal Occult Bld: POSITIVE — AB

## 2024-04-16 MED ORDER — FUROSEMIDE 10 MG/ML IJ SOLN
20.0000 mg | Freq: Once | INTRAMUSCULAR | Status: AC
  Administered 2024-04-16: 20 mg via INTRAVENOUS
  Filled 2024-04-16: qty 2

## 2024-04-16 NOTE — Plan of Care (Signed)
  Problem: Education: Goal: Knowledge of General Education information will improve Description: Including pain rating scale, medication(s)/side effects and non-pharmacologic comfort measures Outcome: Progressing   Problem: Clinical Measurements: Goal: Ability to maintain clinical measurements within normal limits will improve Outcome: Progressing   Problem: Elimination: Goal: Will not experience complications related to bowel motility Outcome: Progressing   Problem: Pain Managment: Goal: General experience of comfort will improve and/or be controlled Outcome: Progressing

## 2024-04-16 NOTE — Progress Notes (Signed)
 Physical Therapy Treatment Patient Details Name: [name] MRN: [id number] DOB: Sep 22, 1950 Today's Date: 04/16/2024   History of Present Illness Hx limited by somnolence.   [name] is a 73 y.o. female with hx of CAD, carotid artery disease with severe RICA stenosis s/p R-CEA (12/'24), renal artery stenosis, IPF, CKD stage IIIb, hx HTN although more recent chronic/orthostatic hypotension on midodrine , HLD, hypothyroidism, rheumatoid arthritis, GERD, fibromyalgia, who presented with acute onset of chest tightness and pleuritic chest pain. Reports onset yesterday morning upon awakening. Other than this she cannot really tell me much, frequently falling asleep. Does acknowledge constipation. Unclear if having dysphagia. + diffuse myalgias.    PT Comments  Pt was agreeable to completing today's PT treatment. Today's treatment focused primarily on ambulation and transfers. Her SpO2 was monitored throughout today's treatment. She remaining at 96% when on 2L O2. However, she desaturated to 85% while ambulating on RA, but she remained asymptomatic throughout. She returned to the room and was placed on 2L O2 and her SpO2 returned to 96%. She was able to ambulate short distances without an assistive device as she was able to transfer to and from the toilet without external assistance. She was also able to complete peri-care with modified independence. She reported feeling fatigued upon the conclusion of today's treatment. Patient will benefit from continued skilled physical therapy in hospital and recommended venue below to increase strength, balance, endurance for safe ADLs and gait.     If plan is discharge home, recommend the following: A little help with walking and/or transfers;A little help with bathing/dressing/bathroom;Assistance with cooking/housework;Assist for transportation;Help with stairs or ramp for entrance   Can travel by private vehicle        Equipment Recommendations  None  recommended by PT    Recommendations for Other Services       Precautions / Restrictions Precautions Precautions: Fall Recall of Precautions/Restrictions: Intact Restrictions Weight Bearing Restrictions Per Provider Order: No     Mobility  Bed Mobility Overal bed mobility: Modified Independent             General bed mobility comments: slow and labored movement    Transfers Overall transfer level: Needs assistance Equipment used: Straight cane Transfers: Sit to/from Stand, Bed to chair/wheelchair/BSC Sit to Stand: Supervision   Step pivot transfers: Supervision            Ambulation/Gait Ambulation/Gait assistance: Supervision Gait Distance (Feet): 60 Feet Assistive device: Straight cane Gait Pattern/deviations: Step-through pattern, Decreased stride length Gait velocity: decreased     General Gait Details: desaturated from 96% to 85% on RA during ambulation; asymptomatic   Stairs             Wheelchair Mobility     Tilt Bed    Modified Rankin (Stroke Patients Only)       Balance Overall balance assessment: Mild deficits observed, not formally tested                                          Communication Communication Communication: No apparent difficulties  Cognition Arousal: Alert Behavior During Therapy: WFL for tasks assessed/performed   PT - Cognitive impairments: No apparent impairments                         Following commands: Intact      Cueing Cueing Techniques: Verbal cues  Exercises      General Comments        Pertinent Vitals/Pain Pain Assessment Pain Assessment: 0-10 Pain Score: 7  Pain Location: general Pain Descriptors / Indicators: Other (Comment) (like I do everyday.) Pain Intervention(s): Monitored during session, Repositioned    Home Living                          Prior Function            PT Goals (current goals can now be found in the care plan  section) Acute Rehab PT Goals Patient Stated Goal: return to her ALF with HHPT PT Goal Formulation: With patient Time For Goal Achievement: 04/22/24 Potential to Achieve Goals: Good Progress towards PT goals: Progressing toward goals    Frequency    Min 3X/week      PT Plan      Co-evaluation              AM-PAC PT 6 Clicks Mobility   Outcome Measure  Help needed turning from your back to your side while in a flat bed without using bedrails?: None Help needed moving from lying on your back to sitting on the side of a flat bed without using bedrails?: None Help needed moving to and from a bed to a chair (including a wheelchair)?: None Help needed standing up from a chair using your arms (e.g., wheelchair or bedside chair)?: None Help needed to walk in hospital room?: A Little Help needed climbing 3-5 steps with a railing? : A Little 6 Click Score: 22    End of Session   Activity Tolerance: Patient limited by fatigue Patient left: in chair;with call bell/phone within reach Nurse Communication: Other (comment) (vitals throughout mobility) PT Visit Diagnosis: Muscle weakness (generalized) (M62.81);Other abnormalities of gait and mobility (R26.89);Difficulty in walking, not elsewhere classified (R26.2)     Time: 8851-8790 PT Time Calculation (min) (ACUTE ONLY): 21 min  Charges:    $Therapeutic Activity: 8-22 mins PT General Charges $$ ACUTE PT VISIT: 1 Visit                     Lacinda Fass, PT, DPT  04/16/2024, 12:29 PM

## 2024-04-16 NOTE — Progress Notes (Signed)
 Patient reported that she had blood in stool. Patient made aware to inform staff the next time she has bowel movement. Md Tat made aware. Obtained hemoccult stool at 1034 and sent to lab.

## 2024-04-16 NOTE — Progress Notes (Signed)
 PROGRESS NOTE  [name] FMW:[id number] DOB: 1951-03-18 DOA: 04/11/2024 PCP: [name], MD  Brief History55  73 y/o female with a history of CAD, carotid artery disease with severe RICA stenosis s/p R-CEA (12//20/2024), IPF, HTN, renal artery stenosis, GERD, fibromyalgia, rheumatoid arthritis, hypothyroidism, CKD stage IIIb, HFpEFand HLD presenting diffuse pain body particularly headache, neck pain, chest pain, abdominal pain.  She also complained of some shortness of breath.  The patient resides at [facility].  She states that she was recently given a course of antibiotics, Augmentin for which she felt to be pneumonia. She denied any nausea, vomiting or diarrhea, abdominal pain.  She has nonproductive cough. The patient has had recent hospitalizations from 10/20/2023 to 10/26/2023 for acute on chronic HFpEF. She was discharged to SNF with torsemide  40 mg daily.  She was subsequently admitted from 11/24/2023 to 11/27/2023 secondary to acute on chronic renal failure, CKD stage IIIb as well as vertigo and dizziness.  It was felt that her vertigo and dizziness was multifactorial including orthostatic hypotension, volume depletion, BPPV.  She was discharged on midodrine .  In the ED, the patient was hypoxic in the 80s.  She was tachypneic.  He was hemodynamically stable.  WBC 9.8, hemoglobin 11.8, plates 837.  CTA chest was negative for PE but showed consolidative opacity in the right lower lobe with a small right effusion.  There was shotty mediastinal lymphadenopathy.  CT of the abdomen and pelvis showed abundant stool, but there was no other acute findings.  CT of the brain was negative for acute findings.  CT of the cervical spine was negative for any fracture or dislocation.  The patient was started on ceftriaxone  and azithromycin.   Assessment/Plan: Acute respiratory failure with hypoxia - Secondary to pneumonia - Presented with tachypnea and hypoxia - Stable on 2 L - Wean oxygen  as tolerated   Lobar pneumonia - CTA chest as discussed above - Continue ceftriaxone  and azithromycin   Acute on chronic HFpEF - The patient has some signs of fluid overload - redose IV furosemide  - Accurate I's and O's--incomplete - Daily weights - 10/21/2023 echo EF 65 to 70%, no WMA, normal RVF, trivial MR  Heme Positive stool -pt claims to have hematochezia, but not seen by nursing -stool heme positive, Hgb stable -pt requesting GI consult   CKD stage IIIb - Baseline creatinine 1.4-1.7 - Monitor with diuresis   Abdominal pain - CT abdomen pelvis as discussed above - Secondary to constipation - Continue cathartics>>improving with BMs   Pulmonary fibrosis Noted on CT angiogram.  Outpatient follow-up with pulmonology.   Hypothyroidism - Continue Synthroid    Coronary Artery Disease - No chest pain presently - Continue statin   Peripheral arterial disease -s/p R-CEA 06/2023 -continue ASA, plavix , statin   Orthostatic hypotension - Continue midodrine  - As needed meclizine   Mixed hyperlipidemia - Continue statin   Rheumatoid arthritis - No active flare presently - Currently not on any DMARDs   Depression/anxiety - Continue home dose alprazolam  - PDMP reviewed--alprazolam , #71, last refill 03/30/2024   Obesity -BMI 31.21 -Lifestyle modification                 Family Communication: no  Family at bedside   Consultants:  none   Code Status:  FULL    DVT Prophylaxis:  [place] Heparin      Procedures: As Listed in Progress Note Above   Antibiotics: Ceftriaxone  9/29, 10/3>> Azithro  9/29 -Unasyn  9/30>>10/2      Subjective: Pt states she is breathing better than yesterday.  Denies cp, n/v/d.  States abd pain is improving with BM.  Claims to have had 2 bloody BMs  Objective: Vitals:   04/15/24 2142 04/16/24 0602 04/16/24 0718 04/16/24 1316  BP: (!) 140/59 117/69  (!) 96/58  Pulse: 89 87  78  Resp:  20    Temp: 98.6 F (37 C) 98.3 F (36.8  C)  98 F (36.7 C)  TempSrc: Oral Oral  Oral  SpO2: 90% 95% 96% 98%  Weight:  70.1 kg    Height:        Intake/Output Summary (Last 24 hours) at 04/16/2024 1612 Last data filed at 04/16/2024 0842 Gross per 24 hour  Intake 243 ml  Output --  Net 243 ml   Weight change: 0 kg Exam:  General:  Pt is alert, follows commands appropriately, not in acute distress HEENT: No icterus, No thrush, No neck mass, [place]/AT Cardiovascular: RRR, S1/S2, no rubs, no gallops Respiratory: bibasilar crackles.  No wheeze Abdomen: Soft/+BS, non tender, non distended, no guarding Extremities: 1 +LE edema, No lymphangitis, No petechiae, No rashes, no synovitis   Data Reviewed: I have personally reviewed following labs and imaging studies Basic Metabolic Panel: Recent Labs  Lab 04/11/24 1749 04/12/24 0457 04/13/24 0424 04/14/24 0420 04/15/24 0424 04/16/24 0450  NA 136 138 141 139 139 139  K 3.7 3.4* 4.1 4.5 4.4 4.1  CL 96* 102 106 105 104 103  CO2 25 25 22 23 22 24   GLUCOSE 103* 87 108* 106* 90 98  BUN 21 20 17 16 17 22   CREATININE 1.32* 1.29* 1.34* 1.29* 1.34* 1.46*  CALCIUM  8.7* 8.4* 8.8* 8.8* 9.0 8.8*  MG 2.1 2.1  --   --   --  2.4  PHOS  --  4.4  --   --   --   --    Liver Function Tests: Recent Labs  Lab 04/11/24 1749 04/13/24 0424 04/14/24 0420 04/15/24 0424  AST 16 14* 15 15  ALT 8 7 7 8   ALKPHOS 73 67 67 70  BILITOT 0.8 0.2 0.3 0.4  PROT 7.5 6.3* 6.4* 6.6  ALBUMIN  3.5 3.4* 3.4* 3.5   Recent Labs  Lab 04/13/24 0424  LIPASE 18   No results for input(s): AMMONIA in the last 168 hours. Coagulation Profile: No results for input(s): INR, PROTIME in the last 168 hours. CBC: Recent Labs  Lab 04/11/24 1749 04/12/24 0457 04/14/24 0420 04/15/24 0424 04/16/24 0450  WBC 9.8 6.5 7.8 6.3 5.6  NEUTROABS 8.0*  --   --   --   --   HGB 11.8* 9.4* 8.7* 9.0* 8.6*  HCT 36.7 29.1* 27.6* 28.5* 27.1*  MCV 97.3 99.0 99.3 99.0 98.9  PLT 189 162 165 172 179   Cardiac  Enzymes: Recent Labs  Lab 04/11/24 1749  CKTOTAL 57   BNP: Invalid input(s): POCBNP CBG: Recent Labs  Lab 04/11/24 2003  GLUCAP 106*   HbA1C: No results for input(s): HGBA1C in the last 72 hours. Urine analysis:    Component Value Date/Time   COLORURINE STRAW (A) 04/12/2024 0004   APPEARANCEUR CLEAR 04/12/2024 0004   APPEARANCEUR Clear 10/14/2021 1419   LABSPEC 1.020 04/12/2024 0004   PHURINE 7.0 04/12/2024 0004   GLUCOSEU >=500 (A) 04/12/2024 0004   HGBUR NEGATIVE 04/12/2024 0004   BILIRUBINUR NEGATIVE 04/12/2024 0004   BILIRUBINUR Negative 10/14/2021 1419   KETONESUR NEGATIVE 04/12/2024 0004  PROTEINUR NEGATIVE 04/12/2024 0004   UROBILINOGEN 0.2 02/06/2014 1816   NITRITE NEGATIVE 04/12/2024 0004   LEUKOCYTESUR NEGATIVE 04/12/2024 0004   Sepsis Labs: @LABRCNTIP (procalcitonin:4,lacticidven:4) ) Recent Results (from the past 240 hours)  Resp panel by RT-PCR (RSV, Flu A&B, Covid) Anterior Nasal Swab     Status: None   Collection Time: 04/11/24  6:25 PM   Specimen: Anterior Nasal Swab  Result Value Ref Range Status   SARS Coronavirus 2 by RT PCR NEGATIVE NEGATIVE Final    Comment: (NOTE) SARS-CoV-2 target nucleic acids are NOT DETECTED.  The SARS-CoV-2 RNA is generally detectable in upper respiratory specimens during the acute phase of infection. The lowest concentration of SARS-CoV-2 viral copies this assay can detect is 138 copies/mL. A negative result does not preclude SARS-Cov-2 infection and should not be used as the sole basis for treatment or other patient management decisions. A negative result may occur with  improper specimen collection/handling, submission of specimen other than nasopharyngeal swab, presence of viral mutation(s) within the areas targeted by this assay, and inadequate number of viral copies(<138 copies/mL). A negative result must be combined with clinical observations, patient history, and epidemiological information. The expected  result is Negative.  Fact Sheet for Patients:  BloggerCourse.com  Fact Sheet for Healthcare Providers:  SeriousBroker.it  This test is no t yet approved or cleared by the [place]  FDA and  has been authorized for detection and/or diagnosis of SARS-CoV-2 by FDA under an Emergency Use Authorization (EUA). This EUA will remain  in effect (meaning this test can be used) for the duration of the COVID-19 declaration under Section 564(b)(1) of the Act, 21 U.S.C.section 360bbb-3(b)(1), unless the authorization is terminated  or revoked sooner.       Influenza A by PCR NEGATIVE NEGATIVE Final   Influenza B by PCR NEGATIVE NEGATIVE Final    Comment: (NOTE) The Xpert Xpress SARS-CoV-2/FLU/RSV plus assay is intended as an aid in the diagnosis of influenza from Nasopharyngeal swab specimens and should not be used as a sole basis for treatment. Nasal washings and aspirates are unacceptable for Xpert Xpress SARS-CoV-2/FLU/RSV testing.  Fact Sheet for Patients: BloggerCourse.com  Fact Sheet for Healthcare Providers: SeriousBroker.it  This test is not yet approved or cleared by the [place]  FDA and has been authorized for detection and/or diagnosis of SARS-CoV-2 by FDA under an Emergency Use Authorization (EUA). This EUA will remain in effect (meaning this test can be used) for the duration of the COVID-19 declaration under Section 564(b)(1) of the Act, 21 U.S.C. section 360bbb-3(b)(1), unless the authorization is terminated or revoked.     Resp Syncytial Virus by PCR NEGATIVE NEGATIVE Final    Comment: (NOTE) Fact Sheet for Patients: BloggerCourse.com  Fact Sheet for Healthcare Providers: SeriousBroker.it  This test is not yet approved or cleared by the [place]  FDA and has been authorized for detection and/or diagnosis of  SARS-CoV-2 by FDA under an Emergency Use Authorization (EUA). This EUA will remain in effect (meaning this test can be used) for the duration of the COVID-19 declaration under Section 564(b)(1) of the Act, 21 U.S.C. section 360bbb-3(b)(1), unless the authorization is terminated or revoked.  Performed at [facility], [address] [place], [place] [address]   Blood Culture (routine x 2)     Status: None   Collection Time: 04/11/24  8:15 PM   Specimen: Left Antecubital; Blood  Result Value Ref Range Status   Specimen Description LEFT ANTECUBITAL  Final   Special Requests  Final    BOTTLES DRAWN AEROBIC AND ANAEROBIC Blood Culture adequate volume   Culture   Final    NO GROWTH 5 DAYS Performed at [facility], [address], [place], [place] [address]    Report Status 04/16/2024 FINAL  Final  Blood Culture (routine x 2)     Status: None   Collection Time: 04/11/24  8:42 PM   Specimen: BLOOD  Result Value Ref Range Status   Specimen Description BLOOD BLOOD RIGHT ARM  Final   Special Requests NONE  Final   Culture   Final    NO GROWTH 5 DAYS Performed at [facility], [address] [place], [place] [address]    Report Status 04/16/2024 FINAL  Final  Respiratory (~20 pathogens) panel by PCR     Status: None   Collection Time: 04/12/24  3:21 AM   Specimen: Nasopharyngeal Swab; Respiratory  Result Value Ref Range Status   Adenovirus NOT DETECTED NOT DETECTED Final   Coronavirus 229E NOT DETECTED NOT DETECTED Final    Comment: (NOTE) The Coronavirus on the Respiratory Panel, DOES NOT test for the novel  Coronavirus (2019 nCoV)    Coronavirus HKU1 NOT DETECTED NOT DETECTED Final   Coronavirus NL63 NOT DETECTED NOT DETECTED Final   Coronavirus OC43 NOT DETECTED NOT DETECTED Final   Metapneumovirus NOT DETECTED NOT DETECTED Final   Rhinovirus / Enterovirus NOT DETECTED NOT DETECTED Final   Influenza A NOT DETECTED NOT DETECTED Final   Influenza B NOT DETECTED NOT DETECTED  Final   Parainfluenza Virus 1 NOT DETECTED NOT DETECTED Final   Parainfluenza Virus 2 NOT DETECTED NOT DETECTED Final   Parainfluenza Virus 3 NOT DETECTED NOT DETECTED Final   Parainfluenza Virus 4 NOT DETECTED NOT DETECTED Final   Respiratory Syncytial Virus NOT DETECTED NOT DETECTED Final   Bordetella pertussis NOT DETECTED NOT DETECTED Final   Bordetella Parapertussis NOT DETECTED NOT DETECTED Final   Chlamydophila pneumoniae NOT DETECTED NOT DETECTED Final   Mycoplasma pneumoniae NOT DETECTED NOT DETECTED Final    Comment: Performed at [facility], [address], [place], [place] [address]  MRSA Next Gen by PCR, Nasal     Status: None   Collection Time: 04/12/24 11:52 AM   Specimen: Nasal Mucosa; Nasal Swab  Result Value Ref Range Status   MRSA by PCR Next Gen NOT DETECTED NOT DETECTED Final    Comment: (NOTE) The GeneXpert MRSA Assay (FDA approved for NASAL specimens only), is one component of a comprehensive MRSA colonization surveillance program. It is not intended to diagnose MRSA infection nor to guide or monitor treatment for MRSA infections. Test performance is not FDA approved in patients less than 22 years old. Performed at [facility], [address] [place], [place] [address]      Scheduled Meds:  albuterol   2.5 mg Nebulization BID   ALPRAZolam   0.5 mg Oral BID   aspirin  EC  81 mg Oral Daily   clopidogrel   75 mg Oral Daily   doxycycline  100 mg Oral Q12H   escitalopram  10 mg Oral Daily   furosemide   20 mg Intravenous Once   guaiFENesin   600 mg Oral BID   heparin   5,000 Units Subcutaneous Q8H   levothyroxine   25 mcg Oral QAC breakfast   midodrine   5 mg Oral TID WC   mineral oil  1 enema Rectal Once   pantoprazole   40 mg Oral Daily   polyethylene glycol  17 g Oral BID  rosuvastatin   40 mg Oral Daily   senna-docusate  2 tablet Oral BID   sodium chloride  flush  3 mL Intravenous Q12H   traZODone   100 mg Oral QHS   Continuous Infusions:  cefTRIAXone   (ROCEPHIN )  IV 1 g (04/16/24 1510)    Procedures/Studies: CT Angio Chest PE W and/or Wo Contrast Result Date: 04/11/2024 CLINICAL DATA:  PE suspected, chest and abdominal pain EXAM: CT ANGIOGRAPHY CHEST CT ABDOMEN AND PELVIS WITH CONTRAST TECHNIQUE: Multidetector CT imaging of the chest was performed using the standard protocol during bolus administration of intravenous contrast. Multiplanar CT image reconstructions and MIPs were obtained to evaluate the vascular anatomy. Multidetector CT imaging of the abdomen and pelvis was performed using the standard protocol during bolus administration of intravenous contrast. RADIATION DOSE REDUCTION: This exam was performed according to the departmental dose-optimization program which includes automated exposure control, adjustment of the mA and/or kV according to patient size and/or use of iterative reconstruction technique. CONTRAST:  75mL OMNIPAQUE  IOHEXOL  350 MG/ML SOLN COMPARISON:  CT chest angiogram, 10/22/2023 FINDINGS: CT CHEST ANGIOGRAM FINDINGS Cardiovascular: Satisfactory opacification of the pulmonary arteries to the segmental level. No evidence of pulmonary embolism. Normal heart size. Extensive three-vessel coronary artery calcifications. No pericardial effusion. Severe aortic atherosclerosis. Mediastinum/Nodes: Numerous enlarged mediastinal and bilateral hilar lymph nodes, pretracheal nodes measuring up to 1.6 x 1.5 cm (series 8, image 66) thyroid  gland, trachea, and esophagus demonstrate no significant findings. Lungs/Pleura: Heterogeneous and consolidative airspace opacity in the right lung base with a small associated right pleural effusion (series 9, image 71). Unchanged mild bibasilar predominant pulmonary fibrosis. Minimal paraseptal emphysema. Musculoskeletal: No chest wall abnormality. No acute osseous findings. Review of the MIP images confirms the above findings. CT ABDOMEN PELVIS FINDINGS Hepatobiliary: No focal liver abnormality is seen. Status  post cholecystectomy. Postoperative biliary dilatation. Pancreas: Unremarkable. No pancreatic ductal dilatation or surrounding inflammatory changes. Spleen: Normal in size without significant abnormality. Adrenals/Urinary Tract: Adrenal glands are unremarkable. Simple, benign bilateral renal cortical cysts for which no further follow-up or characterization is required. Kidneys are otherwise normal, without renal calculi, solid lesion, or hydronephrosis. Bladder is unremarkable. Stomach/Bowel: Stomach is within normal limits. Appendix not clearly visualized. No evidence of bowel wall thickening, distention, or inflammatory changes. Large burden of stool and stool balls throughout the colon and rectum Vascular/Lymphatic: Severe aortic atherosclerosis. No enlarged abdominal or pelvic lymph nodes. Reproductive: Hysterectomy. Other: No abdominal wall hernia or abnormality. No ascites. Musculoskeletal: No acute or significant osseous findings. IMPRESSION: 1. Negative examination for pulmonary embolism. 2. Heterogeneous and consolidative airspace opacity in the right lung base with a small associated right pleural effusion, consistent with infection or aspiration. 3. Numerous enlarged mediastinal and bilateral hilar lymph nodes, likely reactive. 4. 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 5. Large burden of stool and stool balls throughout the colon and rectum. 6. Coronary artery disease. Aortic Atherosclerosis (ICD10-I70.0) and Emphysema (ICD10-J43.9). Electronically Signed   By: Marolyn JONETTA Jaksch M.D.   On: 04/11/2024 21:49   CT ABDOMEN PELVIS W CONTRAST Result Date: 04/11/2024 CLINICAL DATA:  PE suspected, chest and abdominal pain EXAM: CT ANGIOGRAPHY CHEST CT ABDOMEN AND PELVIS WITH CONTRAST TECHNIQUE: Multidetector CT imaging of the chest was performed using the standard protocol during bolus administration of intravenous contrast. Multiplanar CT image  reconstructions and MIPs were obtained to evaluate the vascular anatomy. Multidetector CT imaging of the abdomen and pelvis was performed using the standard protocol during bolus administration of intravenous contrast.  RADIATION DOSE REDUCTION: This exam was performed according to the departmental dose-optimization program which includes automated exposure control, adjustment of the mA and/or kV according to patient size and/or use of iterative reconstruction technique. CONTRAST:  75mL OMNIPAQUE  IOHEXOL  350 MG/ML SOLN COMPARISON:  CT chest angiogram, 10/22/2023 FINDINGS: CT CHEST ANGIOGRAM FINDINGS Cardiovascular: Satisfactory opacification of the pulmonary arteries to the segmental level. No evidence of pulmonary embolism. Normal heart size. Extensive three-vessel coronary artery calcifications. No pericardial effusion. Severe aortic atherosclerosis. Mediastinum/Nodes: Numerous enlarged mediastinal and bilateral hilar lymph nodes, pretracheal nodes measuring up to 1.6 x 1.5 cm (series 8, image 66) thyroid  gland, trachea, and esophagus demonstrate no significant findings. Lungs/Pleura: Heterogeneous and consolidative airspace opacity in the right lung base with a small associated right pleural effusion (series 9, image 71). Unchanged mild bibasilar predominant pulmonary fibrosis. Minimal paraseptal emphysema. Musculoskeletal: No chest wall abnormality. No acute osseous findings. Review of the MIP images confirms the above findings. CT ABDOMEN PELVIS FINDINGS Hepatobiliary: No focal liver abnormality is seen. Status post cholecystectomy. Postoperative biliary dilatation. Pancreas: Unremarkable. No pancreatic ductal dilatation or surrounding inflammatory changes. Spleen: Normal in size without significant abnormality. Adrenals/Urinary Tract: Adrenal glands are unremarkable. Simple, benign bilateral renal cortical cysts for which no further follow-up or characterization is required. Kidneys are otherwise normal,  without renal calculi, solid lesion, or hydronephrosis. Bladder is unremarkable. Stomach/Bowel: Stomach is within normal limits. Appendix not clearly visualized. No evidence of bowel wall thickening, distention, or inflammatory changes. Large burden of stool and stool balls throughout the colon and rectum Vascular/Lymphatic: Severe aortic atherosclerosis. No enlarged abdominal or pelvic lymph nodes. Reproductive: Hysterectomy. Other: No abdominal wall hernia or abnormality. No ascites. Musculoskeletal: No acute or significant osseous findings. IMPRESSION: 1. Negative examination for pulmonary embolism. 2. Heterogeneous and consolidative airspace opacity in the right lung base with a small associated right pleural effusion, consistent with infection or aspiration. 3. Numerous enlarged mediastinal and bilateral hilar lymph nodes, likely reactive. 4. 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 5. Large burden of stool and stool balls throughout the colon and rectum. 6. Coronary artery disease. Aortic Atherosclerosis (ICD10-I70.0) and Emphysema (ICD10-J43.9). Electronically Signed   By: Marolyn JONETTA Jaksch M.D.   On: 04/11/2024 21:49   CT Head Wo Contrast Result Date: 04/11/2024 CLINICAL DATA:  Head and neck pain, no known injury EXAM: CT HEAD WITHOUT CONTRAST CT CERVICAL SPINE WITHOUT CONTRAST TECHNIQUE: Multidetector CT imaging of the head and cervical spine was performed following the standard protocol without intravenous contrast. Multiplanar CT image reconstructions of the cervical spine were also generated. RADIATION DOSE REDUCTION: This exam was performed according to the departmental dose-optimization program which includes automated exposure control, adjustment of the mA and/or kV according to patient size and/or use of iterative reconstruction technique. COMPARISON:  MR brain, 11/26/2023 FINDINGS: CT HEAD FINDINGS Brain: No evidence of acute  infarction, hemorrhage, hydrocephalus, extra-axial collection or mass lesion/mass effect. Periventricular and deep white matter hypodensity. Vascular: No hyperdense vessel or unexpected calcification. Skull: Normal. Negative for fracture or focal lesion. Sinuses/Orbits: No acute finding. Other: None. CT CERVICAL SPINE FINDINGS Alignment: Postoperative straightening of the normal cervical lordosis. Skull base and vertebrae: No acute fracture. No primary bone lesion or focal pathologic process. Soft tissues and spinal canal: No prevertebral fluid or swelling. No visible canal hematoma. Disc levels: Anterior cervical discectomy and fusion of C4 through C7. Mild-to-moderate disc space height loss and osteophytosis of the remaining cervical levels. Upper chest: Negative. Other: None. IMPRESSION:  1. No acute intracranial pathology. 2. No fracture or static subluxation of the cervical spine. 3. Anterior cervical discectomy and fusion of C4 through C7. Mild-to-moderate disc space height loss and osteophytosis of the remaining cervical levels. Electronically Signed   By: Marolyn JONETTA Jaksch M.D.   On: 04/11/2024 21:42   CT Cervical Spine Wo Contrast Result Date: 04/11/2024 CLINICAL DATA:  Head and neck pain, no known injury EXAM: CT HEAD WITHOUT CONTRAST CT CERVICAL SPINE WITHOUT CONTRAST TECHNIQUE: Multidetector CT imaging of the head and cervical spine was performed following the standard protocol without intravenous contrast. Multiplanar CT image reconstructions of the cervical spine were also generated. RADIATION DOSE REDUCTION: This exam was performed according to the departmental dose-optimization program which includes automated exposure control, adjustment of the mA and/or kV according to patient size and/or use of iterative reconstruction technique. COMPARISON:  MR brain, 11/26/2023 FINDINGS: CT HEAD FINDINGS Brain: No evidence of acute infarction, hemorrhage, hydrocephalus, extra-axial collection or mass lesion/mass  effect. Periventricular and deep white matter hypodensity. Vascular: No hyperdense vessel or unexpected calcification. Skull: Normal. Negative for fracture or focal lesion. Sinuses/Orbits: No acute finding. Other: None. CT CERVICAL SPINE FINDINGS Alignment: Postoperative straightening of the normal cervical lordosis. Skull base and vertebrae: No acute fracture. No primary bone lesion or focal pathologic process. Soft tissues and spinal canal: No prevertebral fluid or swelling. No visible canal hematoma. Disc levels: Anterior cervical discectomy and fusion of C4 through C7. Mild-to-moderate disc space height loss and osteophytosis of the remaining cervical levels. Upper chest: Negative. Other: None. IMPRESSION: 1. No acute intracranial pathology. 2. No fracture or static subluxation of the cervical spine. 3. Anterior cervical discectomy and fusion of C4 through C7. Mild-to-moderate disc space height loss and osteophytosis of the remaining cervical levels. Electronically Signed   By: Marolyn JONETTA Jaksch M.D.   On: 04/11/2024 21:42    Alm Schneider, DO  [facility]  If 7PM-7AM, please contact [email or web address] [email or web address] Password [email or web address] 04/16/2024, 4:12 PM   LOS: 5 days

## 2024-04-17 DIAGNOSIS — J181 Lobar pneumonia, unspecified organism: Secondary | ICD-10-CM | POA: Diagnosis not present

## 2024-04-17 DIAGNOSIS — J84112 Idiopathic pulmonary fibrosis: Secondary | ICD-10-CM | POA: Diagnosis not present

## 2024-04-17 DIAGNOSIS — I5033 Acute on chronic diastolic (congestive) heart failure: Secondary | ICD-10-CM | POA: Diagnosis not present

## 2024-04-17 LAB — CBC
HCT: 26 % — ABNORMAL LOW (ref 36.0–46.0)
Hemoglobin: 8.2 g/dL — ABNORMAL LOW (ref 12.0–15.0)
MCH: 30.8 pg (ref 26.0–34.0)
MCHC: 31.5 g/dL (ref 30.0–36.0)
MCV: 97.7 fL (ref 80.0–100.0)
Platelets: 178 K/uL (ref 150–400)
RBC: 2.66 MIL/uL — ABNORMAL LOW (ref 3.87–5.11)
RDW: 13.8 % (ref 11.5–15.5)
WBC: 5.1 K/uL (ref 4.0–10.5)
nRBC: 0 % (ref 0.0–0.2)

## 2024-04-17 LAB — BASIC METABOLIC PANEL WITH GFR
Anion gap: 13 (ref 5–15)
BUN: 22 mg/dL (ref 8–23)
CO2: 23 mmol/L (ref 22–32)
Calcium: 9 mg/dL (ref 8.9–10.3)
Chloride: 105 mmol/L (ref 98–111)
Creatinine, Ser: 1.46 mg/dL — ABNORMAL HIGH (ref 0.44–1.00)
GFR, Estimated: 37 mL/min — ABNORMAL LOW (ref >60)
Glucose, Bld: 90 mg/dL (ref 70–99)
Potassium: 4 mmol/L (ref 3.5–5.1)
Sodium: 141 mmol/L (ref 135–145)

## 2024-04-17 LAB — GLUCOSE, CAPILLARY: Glucose-Capillary: 102 mg/dL — ABNORMAL HIGH (ref 70–99)

## 2024-04-17 MED ORDER — MIDODRINE HCL 5 MG PO TABS
10.0000 mg | ORAL_TABLET | Freq: Three times a day (TID) | ORAL | Status: DC
  Administered 2024-04-17 – 2024-04-18 (×4): 10 mg via ORAL
  Filled 2024-04-17 (×4): qty 2

## 2024-04-17 MED ORDER — ALBUMIN HUMAN 25 % IV SOLN
12.5000 g | Freq: Once | INTRAVENOUS | Status: AC
  Administered 2024-04-17: 12.5 g via INTRAVENOUS
  Filled 2024-04-17: qty 50

## 2024-04-17 NOTE — Progress Notes (Signed)
 PROGRESS NOTE  [name] FMW:[id number] DOB: [birth year] DOA: 04/11/2024 PCP: [name], MD  Brief History7  73 y/o female with a history of CAD, carotid artery disease with severe RICA stenosis s/p R-CEA (12//20/2024), IPF, HTN, renal artery stenosis, GERD, fibromyalgia, rheumatoid arthritis, hypothyroidism, CKD stage IIIb, HFpEFand HLD presenting diffuse pain body particularly headache, neck pain, chest pain, abdominal pain.  She also complained of some shortness of breath.  The patient resides at [facility].  She states that she was recently given a course of antibiotics, Augmentin for which she felt to be pneumonia. She denied any nausea, vomiting or diarrhea, abdominal pain.  She has nonproductive cough. The patient has had recent hospitalizations from 10/20/2023 to 10/26/2023 for acute on chronic HFpEF. She was discharged to SNF with torsemide  40 mg daily.  She was subsequently admitted from 11/24/2023 to 11/27/2023 secondary to acute on chronic renal failure, CKD stage IIIb as well as vertigo and dizziness.  It was felt that her vertigo and dizziness was multifactorial including orthostatic hypotension, volume depletion, BPPV.  She was discharged on midodrine .  In the ED, the patient was hypoxic in the 80s.  She was tachypneic.  He was hemodynamically stable.  WBC 9.8, hemoglobin 11.8, plates 837.  CTA chest was negative for PE but showed consolidative opacity in the right lower lobe with a small right effusion.  There was shotty mediastinal lymphadenopathy.  CT of the abdomen and pelvis showed abundant stool, but there was no other acute findings.  CT of the brain was negative for acute findings.  CT of the cervical spine was negative for any fracture or dislocation.  The patient was started on ceftriaxone  and azithromycin.   Assessment/Plan: Acute respiratory failure with hypoxia - Secondary to pneumonia - Presented with tachypnea and hypoxia - Stable on 2 L - Wean oxygen  as tolerated   Lobar pneumonia - CTA chest as discussed above - Continue ceftriaxone  and azithromycin   Acute on chronic HFpEF - The patient has some signs of fluid overload - redosed IV furosemide  10/4 - Accurate I's and O's--incomplete - Daily weights--NEG 6 lbs - 10/21/2023 echo EF 65 to 70%, no WMA, normal RVF, trivial MR   Heme Positive stool -pt claims to have hematochezia, but not seen by nursing -stool heme positive, Hgb stable -pt requesting GI consult   CKD stage IIIb - Baseline creatinine 1.4-1.7 - Monitor with diuresis   Abdominal pain - CT abdomen pelvis as discussed above - Secondary to constipation - Continue cathartics>>improving with BMs   Pulmonary fibrosis Noted on CT angiogram.  Outpatient follow-up with pulmonology.   Hypothyroidism - Continue Synthroid    Coronary Artery Disease - No chest pain presently - Continue statin   Peripheral arterial disease -s/p R-CEA 06/2023 -continue ASA, plavix , statin   Orthostatic hypotension - Continue midodrine --increase to 10 mg TID - As needed meclizine   Mixed hyperlipidemia - Continue statin   Rheumatoid arthritis - No active flare presently - Currently not on any DMARDs   Depression/anxiety - Continue home dose alprazolam  - PDMP reviewed--alprazolam , #71, last refill 03/30/2024   Obesity -BMI 31.21 -Lifestyle modification                 Family Communication: no  Family at bedside   Consultants:  none   Code Status:  FULL    DVT Prophylaxis:  [place] Heparin      Procedures: As Listed in Progress Note  Above   Antibiotics: Ceftriaxone  9/29, 10/3>> -doxy 10/3>> Azithro 9/29 -Unasyn  9/30>>10/2           Subjective: Pt has some dizziness getting up.  Denies cp, sob, n/v/d, abd pain  Objective: Vitals:   04/17/24 0551 04/17/24 0743 04/17/24 1239 04/17/24 1529  BP: (!) 91/53  (!) 85/40 (!) 102/58  Pulse: 71  66   Resp:   17   Temp: 98 F (36.7 C)  97.9 F (36.6 C)    TempSrc: Oral  Oral   SpO2: 96% 96% 100%   Weight:      Height:        Intake/Output Summary (Last 24 hours) at 04/17/2024 1646 Last data filed at 04/17/2024 1514 Gross per 24 hour  Intake 1098.85 ml  Output --  Net 1098.85 ml   Weight change: -1.7 kg Exam:  General:  Pt is alert, follows commands appropriately, not in acute distress HEENT: No icterus, No thrush, No neck mass, [place]/AT Cardiovascular: RRR, S1/S2, no rubs, no gallops Respiratory: bibasilar crackles. No wheeze Abdomen: Soft/+BS, non tender, non distended, no guarding Extremities: Non pitting edema, No lymphangitis, No petechiae, No rashes, no synovitis   Data Reviewed: I have personally reviewed following labs and imaging studies Basic Metabolic Panel: Recent Labs  Lab 04/11/24 1749 04/12/24 0457 04/13/24 0424 04/14/24 0420 04/15/24 0424 04/16/24 0450 04/17/24 0439  NA 136 138 141 139 139 139 141  K 3.7 3.4* 4.1 4.5 4.4 4.1 4.0  CL 96* 102 106 105 104 103 105  CO2 25 25 22 23 22 24 23   GLUCOSE 103* 87 108* 106* 90 98 90  BUN 21 20 17 16 17 22 22   CREATININE 1.32* 1.29* 1.34* 1.29* 1.34* 1.46* 1.46*  CALCIUM  8.7* 8.4* 8.8* 8.8* 9.0 8.8* 9.0  MG 2.1 2.1  --   --   --  2.4  --   PHOS  --  4.4  --   --   --   --   --    Liver Function Tests: Recent Labs  Lab 04/11/24 1749 04/13/24 0424 04/14/24 0420 04/15/24 0424  AST 16 14* 15 15  ALT 8 7 7 8   ALKPHOS 73 67 67 70  BILITOT 0.8 0.2 0.3 0.4  PROT 7.5 6.3* 6.4* 6.6  ALBUMIN  3.5 3.4* 3.4* 3.5   Recent Labs  Lab 04/13/24 0424  LIPASE 18   No results for input(s): AMMONIA in the last 168 hours. Coagulation Profile: No results for input(s): INR, PROTIME in the last 168 hours. CBC: Recent Labs  Lab 04/11/24 1749 04/12/24 0457 04/14/24 0420 04/15/24 0424 04/16/24 0450 04/17/24 0439  WBC 9.8 6.5 7.8 6.3 5.6 5.1  NEUTROABS 8.0*  --   --   --   --   --   HGB 11.8* 9.4* 8.7* 9.0* 8.6* 8.2*  HCT 36.7 29.1* 27.6* 28.5* 27.1* 26.0*  MCV  97.3 99.0 99.3 99.0 98.9 97.7  PLT 189 162 165 172 179 178   Cardiac Enzymes: Recent Labs  Lab 04/11/24 1749  CKTOTAL 57   BNP: Invalid input(s): POCBNP CBG: Recent Labs  Lab 04/11/24 2003 04/17/24 0043  GLUCAP 106* 102*   HbA1C: No results for input(s): HGBA1C in the last 72 hours. Urine analysis:    Component Value Date/Time   COLORURINE STRAW (A) 04/12/2024 0004   APPEARANCEUR CLEAR 04/12/2024 0004   APPEARANCEUR Clear 10/14/2021 1419   LABSPEC 1.020 04/12/2024 0004   PHURINE 7.0 04/12/2024 0004   GLUCOSEU >=500 (A) 04/12/2024  0004   HGBUR NEGATIVE 04/12/2024 0004   BILIRUBINUR NEGATIVE 04/12/2024 0004   BILIRUBINUR Negative 10/14/2021 1419   KETONESUR NEGATIVE 04/12/2024 0004   PROTEINUR NEGATIVE 04/12/2024 0004   UROBILINOGEN 0.2 02/06/2014 1816   NITRITE NEGATIVE 04/12/2024 0004   LEUKOCYTESUR NEGATIVE 04/12/2024 0004   Sepsis Labs: @LABRCNTIP (procalcitonin:4,lacticidven:4) ) Recent Results (from the past 240 hours)  Resp panel by RT-PCR (RSV, Flu A&B, Covid) Anterior Nasal Swab     Status: None   Collection Time: 04/11/24  6:25 PM   Specimen: Anterior Nasal Swab  Result Value Ref Range Status   SARS Coronavirus 2 by RT PCR NEGATIVE NEGATIVE Final    Comment: (NOTE) SARS-CoV-2 target nucleic acids are NOT DETECTED.  The SARS-CoV-2 RNA is generally detectable in upper respiratory specimens during the acute phase of infection. The lowest concentration of SARS-CoV-2 viral copies this assay can detect is 138 copies/mL. A negative result does not preclude SARS-Cov-2 infection and should not be used as the sole basis for treatment or other patient management decisions. A negative result may occur with  improper specimen collection/handling, submission of specimen other than nasopharyngeal swab, presence of viral mutation(s) within the areas targeted by this assay, and inadequate number of viral copies(<138 copies/mL). A negative result must be combined  with clinical observations, patient history, and epidemiological information. The expected result is Negative.  Fact Sheet for Patients:  BloggerCourse.com  Fact Sheet for Healthcare Providers:  SeriousBroker.it  This test is no t yet approved or cleared by the [place]  FDA and  has been authorized for detection and/or diagnosis of SARS-CoV-2 by FDA under an Emergency Use Authorization (EUA). This EUA will remain  in effect (meaning this test can be used) for the duration of the COVID-19 declaration under Section 564(b)(1) of the Act, 21 U.S.C.section 360bbb-3(b)(1), unless the authorization is terminated  or revoked sooner.       Influenza A by PCR NEGATIVE NEGATIVE Final   Influenza B by PCR NEGATIVE NEGATIVE Final    Comment: (NOTE) The Xpert Xpress SARS-CoV-2/FLU/RSV plus assay is intended as an aid in the diagnosis of influenza from Nasopharyngeal swab specimens and should not be used as a sole basis for treatment. Nasal washings and aspirates are unacceptable for Xpert Xpress SARS-CoV-2/FLU/RSV testing.  Fact Sheet for Patients: BloggerCourse.com  Fact Sheet for Healthcare Providers: SeriousBroker.it  This test is not yet approved or cleared by the [place]  FDA and has been authorized for detection and/or diagnosis of SARS-CoV-2 by FDA under an Emergency Use Authorization (EUA). This EUA will remain in effect (meaning this test can be used) for the duration of the COVID-19 declaration under Section 564(b)(1) of the Act, 21 U.S.C. section 360bbb-3(b)(1), unless the authorization is terminated or revoked.     Resp Syncytial Virus by PCR NEGATIVE NEGATIVE Final    Comment: (NOTE) Fact Sheet for Patients: BloggerCourse.com  Fact Sheet for Healthcare Providers: SeriousBroker.it  This test is not yet approved  or cleared by the [place]  FDA and has been authorized for detection and/or diagnosis of SARS-CoV-2 by FDA under an Emergency Use Authorization (EUA). This EUA will remain in effect (meaning this test can be used) for the duration of the COVID-19 declaration under Section 564(b)(1) of the Act, 21 U.S.C. section 360bbb-3(b)(1), unless the authorization is terminated or revoked.  Performed at [facility], [address] [place], [place] [address]   Blood Culture (routine x 2)     Status: None   Collection Time: 04/11/24  8:15  PM   Specimen: Left Antecubital; Blood  Result Value Ref Range Status   Specimen Description LEFT ANTECUBITAL  Final   Special Requests   Final    BOTTLES DRAWN AEROBIC AND ANAEROBIC Blood Culture adequate volume   Culture   Final    NO GROWTH 5 DAYS Performed at [facility], [address] [place], [place] [address]    Report Status 04/16/2024 FINAL  Final  Blood Culture (routine x 2)     Status: None   Collection Time: 04/11/24  8:42 PM   Specimen: BLOOD  Result Value Ref Range Status   Specimen Description BLOOD BLOOD RIGHT ARM  Final   Special Requests NONE  Final   Culture   Final    NO GROWTH 5 DAYS Performed at [facility], [address], [place], [place] [address]    Report Status 04/16/2024 FINAL  Final  Respiratory (~20 pathogens) panel by PCR     Status: None   Collection Time: 04/12/24  3:21 AM   Specimen: Nasopharyngeal Swab; Respiratory  Result Value Ref Range Status   Adenovirus NOT DETECTED NOT DETECTED Final   Coronavirus 229E NOT DETECTED NOT DETECTED Final    Comment: (NOTE) The Coronavirus on the Respiratory Panel, DOES NOT test for the novel  Coronavirus (2019 nCoV)    Coronavirus HKU1 NOT DETECTED NOT DETECTED Final   Coronavirus NL63 NOT DETECTED NOT DETECTED Final   Coronavirus OC43 NOT DETECTED NOT DETECTED Final   Metapneumovirus NOT DETECTED NOT DETECTED Final   Rhinovirus / Enterovirus NOT DETECTED NOT DETECTED  Final   Influenza A NOT DETECTED NOT DETECTED Final   Influenza B NOT DETECTED NOT DETECTED Final   Parainfluenza Virus 1 NOT DETECTED NOT DETECTED Final   Parainfluenza Virus 2 NOT DETECTED NOT DETECTED Final   Parainfluenza Virus 3 NOT DETECTED NOT DETECTED Final   Parainfluenza Virus 4 NOT DETECTED NOT DETECTED Final   Respiratory Syncytial Virus NOT DETECTED NOT DETECTED Final   Bordetella pertussis NOT DETECTED NOT DETECTED Final   Bordetella Parapertussis NOT DETECTED NOT DETECTED Final   Chlamydophila pneumoniae NOT DETECTED NOT DETECTED Final   Mycoplasma pneumoniae NOT DETECTED NOT DETECTED Final    Comment: Performed at [facility], [address] [place], [place] [address]  MRSA Next Gen by PCR, Nasal     Status: None   Collection Time: 04/12/24 11:52 AM   Specimen: Nasal Mucosa; Nasal Swab  Result Value Ref Range Status   MRSA by PCR Next Gen NOT DETECTED NOT DETECTED Final    Comment: (NOTE) The GeneXpert MRSA Assay (FDA approved for NASAL specimens only), is one component of a comprehensive MRSA colonization surveillance program. It is not intended to diagnose MRSA infection nor to guide or monitor treatment for MRSA infections. Test performance is not FDA approved in patients less than 87 years old. Performed at [facility], [address], [place], [place] [address]      Scheduled Meds:  albuterol   2.5 mg Nebulization BID   ALPRAZolam   0.5 mg Oral BID   aspirin  EC  81 mg Oral Daily   clopidogrel   75 mg Oral Daily   doxycycline  100 mg Oral Q12H   escitalopram  10 mg Oral Daily   guaiFENesin   600 mg Oral BID   heparin   5,000 Units Subcutaneous Q8H   levothyroxine   25 mcg Oral QAC breakfast   midodrine   10 mg Oral TID WC   mineral oil  1 enema Rectal  Once   pantoprazole   40 mg Oral Daily   polyethylene glycol  17 g Oral BID   rosuvastatin   40 mg Oral Daily   senna-docusate  2 tablet Oral BID   sodium chloride  flush  3 mL Intravenous Q12H   traZODone    100 mg Oral QHS   Continuous Infusions:  cefTRIAXone  (ROCEPHIN )  IV Stopped (04/17/24 1513)    Procedures/Studies: CT Angio Chest PE W and/or Wo Contrast Result Date: 04/11/2024 CLINICAL DATA:  PE suspected, chest and abdominal pain EXAM: CT ANGIOGRAPHY CHEST CT ABDOMEN AND PELVIS WITH CONTRAST TECHNIQUE: Multidetector CT imaging of the chest was performed using the standard protocol during bolus administration of intravenous contrast. Multiplanar CT image reconstructions and MIPs were obtained to evaluate the vascular anatomy. Multidetector CT imaging of the abdomen and pelvis was performed using the standard protocol during bolus administration of intravenous contrast. RADIATION DOSE REDUCTION: This exam was performed according to the departmental dose-optimization program which includes automated exposure control, adjustment of the mA and/or kV according to patient size and/or use of iterative reconstruction technique. CONTRAST:  75mL OMNIPAQUE  IOHEXOL  350 MG/ML SOLN COMPARISON:  CT chest angiogram, 10/22/2023 FINDINGS: CT CHEST ANGIOGRAM FINDINGS Cardiovascular: Satisfactory opacification of the pulmonary arteries to the segmental level. No evidence of pulmonary embolism. Normal heart size. Extensive three-vessel coronary artery calcifications. No pericardial effusion. Severe aortic atherosclerosis. Mediastinum/Nodes: Numerous enlarged mediastinal and bilateral hilar lymph nodes, pretracheal nodes measuring up to 1.6 x 1.5 cm (series 8, image 66) thyroid  gland, trachea, and esophagus demonstrate no significant findings. Lungs/Pleura: Heterogeneous and consolidative airspace opacity in the right lung base with a small associated right pleural effusion (series 9, image 71). Unchanged mild bibasilar predominant pulmonary fibrosis. Minimal paraseptal emphysema. Musculoskeletal: No chest wall abnormality. No acute osseous findings. Review of the MIP images confirms the above findings. CT ABDOMEN PELVIS  FINDINGS Hepatobiliary: No focal liver abnormality is seen. Status post cholecystectomy. Postoperative biliary dilatation. Pancreas: Unremarkable. No pancreatic ductal dilatation or surrounding inflammatory changes. Spleen: Normal in size without significant abnormality. Adrenals/Urinary Tract: Adrenal glands are unremarkable. Simple, benign bilateral renal cortical cysts for which no further follow-up or characterization is required. Kidneys are otherwise normal, without renal calculi, solid lesion, or hydronephrosis. Bladder is unremarkable. Stomach/Bowel: Stomach is within normal limits. Appendix not clearly visualized. No evidence of bowel wall thickening, distention, or inflammatory changes. Large burden of stool and stool balls throughout the colon and rectum Vascular/Lymphatic: Severe aortic atherosclerosis. No enlarged abdominal or pelvic lymph nodes. Reproductive: Hysterectomy. Other: No abdominal wall hernia or abnormality. No ascites. Musculoskeletal: No acute or significant osseous findings. IMPRESSION: 1. Negative examination for pulmonary embolism. 2. Heterogeneous and consolidative airspace opacity in the right lung base with a small associated right pleural effusion, consistent with infection or aspiration. 3. Numerous enlarged mediastinal and bilateral hilar lymph nodes, likely reactive. 4. 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 5. Large burden of stool and stool balls throughout the colon and rectum. 6. Coronary artery disease. Aortic Atherosclerosis (ICD10-I70.0) and Emphysema (ICD10-J43.9). Electronically Signed   By: Marolyn JONETTA Jaksch M.D.   On: 04/11/2024 21:49   CT ABDOMEN PELVIS W CONTRAST Result Date: 04/11/2024 CLINICAL DATA:  PE suspected, chest and abdominal pain EXAM: CT ANGIOGRAPHY CHEST CT ABDOMEN AND PELVIS WITH CONTRAST TECHNIQUE: Multidetector CT imaging of the chest was performed using the standard protocol during  bolus administration of intravenous contrast. Multiplanar CT image reconstructions and MIPs were obtained to evaluate the vascular anatomy. Multidetector  CT imaging of the abdomen and pelvis was performed using the standard protocol during bolus administration of intravenous contrast. RADIATION DOSE REDUCTION: This exam was performed according to the departmental dose-optimization program which includes automated exposure control, adjustment of the mA and/or kV according to patient size and/or use of iterative reconstruction technique. CONTRAST:  75mL OMNIPAQUE  IOHEXOL  350 MG/ML SOLN COMPARISON:  CT chest angiogram, 10/22/2023 FINDINGS: CT CHEST ANGIOGRAM FINDINGS Cardiovascular: Satisfactory opacification of the pulmonary arteries to the segmental level. No evidence of pulmonary embolism. Normal heart size. Extensive three-vessel coronary artery calcifications. No pericardial effusion. Severe aortic atherosclerosis. Mediastinum/Nodes: Numerous enlarged mediastinal and bilateral hilar lymph nodes, pretracheal nodes measuring up to 1.6 x 1.5 cm (series 8, image 66) thyroid  gland, trachea, and esophagus demonstrate no significant findings. Lungs/Pleura: Heterogeneous and consolidative airspace opacity in the right lung base with a small associated right pleural effusion (series 9, image 71). Unchanged mild bibasilar predominant pulmonary fibrosis. Minimal paraseptal emphysema. Musculoskeletal: No chest wall abnormality. No acute osseous findings. Review of the MIP images confirms the above findings. CT ABDOMEN PELVIS FINDINGS Hepatobiliary: No focal liver abnormality is seen. Status post cholecystectomy. Postoperative biliary dilatation. Pancreas: Unremarkable. No pancreatic ductal dilatation or surrounding inflammatory changes. Spleen: Normal in size without significant abnormality. Adrenals/Urinary Tract: Adrenal glands are unremarkable. Simple, benign bilateral renal cortical cysts for which no further follow-up or  characterization is required. Kidneys are otherwise normal, without renal calculi, solid lesion, or hydronephrosis. Bladder is unremarkable. Stomach/Bowel: Stomach is within normal limits. Appendix not clearly visualized. No evidence of bowel wall thickening, distention, or inflammatory changes. Large burden of stool and stool balls throughout the colon and rectum Vascular/Lymphatic: Severe aortic atherosclerosis. No enlarged abdominal or pelvic lymph nodes. Reproductive: Hysterectomy. Other: No abdominal wall hernia or abnormality. No ascites. Musculoskeletal: No acute or significant osseous findings. IMPRESSION: 1. Negative examination for pulmonary embolism. 2. Heterogeneous and consolidative airspace opacity in the right lung base with a small associated right pleural effusion, consistent with infection or aspiration. 3. Numerous enlarged mediastinal and bilateral hilar lymph nodes, likely reactive. 4. 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 5. Large burden of stool and stool balls throughout the colon and rectum. 6. Coronary artery disease. Aortic Atherosclerosis (ICD10-I70.0) and Emphysema (ICD10-J43.9). Electronically Signed   By: Marolyn JONETTA Jaksch M.D.   On: 04/11/2024 21:49   CT Head Wo Contrast Result Date: 04/11/2024 CLINICAL DATA:  Head and neck pain, no known injury EXAM: CT HEAD WITHOUT CONTRAST CT CERVICAL SPINE WITHOUT CONTRAST TECHNIQUE: Multidetector CT imaging of the head and cervical spine was performed following the standard protocol without intravenous contrast. Multiplanar CT image reconstructions of the cervical spine were also generated. RADIATION DOSE REDUCTION: This exam was performed according to the departmental dose-optimization program which includes automated exposure control, adjustment of the mA and/or kV according to patient size and/or use of iterative reconstruction technique. COMPARISON:  MR brain, 11/26/2023  FINDINGS: CT HEAD FINDINGS Brain: No evidence of acute infarction, hemorrhage, hydrocephalus, extra-axial collection or mass lesion/mass effect. Periventricular and deep white matter hypodensity. Vascular: No hyperdense vessel or unexpected calcification. Skull: Normal. Negative for fracture or focal lesion. Sinuses/Orbits: No acute finding. Other: None. CT CERVICAL SPINE FINDINGS Alignment: Postoperative straightening of the normal cervical lordosis. Skull base and vertebrae: No acute fracture. No primary bone lesion or focal pathologic process. Soft tissues and spinal canal: No prevertebral fluid or swelling. No visible canal hematoma. Disc levels: Anterior cervical discectomy and fusion of C4 through  C7. Mild-to-moderate disc space height loss and osteophytosis of the remaining cervical levels. Upper chest: Negative. Other: None. IMPRESSION: 1. No acute intracranial pathology. 2. No fracture or static subluxation of the cervical spine. 3. Anterior cervical discectomy and fusion of C4 through C7. Mild-to-moderate disc space height loss and osteophytosis of the remaining cervical levels. Electronically Signed   By: Marolyn JONETTA Jaksch M.D.   On: 04/11/2024 21:42   CT Cervical Spine Wo Contrast Result Date: 04/11/2024 CLINICAL DATA:  Head and neck pain, no known injury EXAM: CT HEAD WITHOUT CONTRAST CT CERVICAL SPINE WITHOUT CONTRAST TECHNIQUE: Multidetector CT imaging of the head and cervical spine was performed following the standard protocol without intravenous contrast. Multiplanar CT image reconstructions of the cervical spine were also generated. RADIATION DOSE REDUCTION: This exam was performed according to the departmental dose-optimization program which includes automated exposure control, adjustment of the mA and/or kV according to patient size and/or use of iterative reconstruction technique. COMPARISON:  MR brain, 11/26/2023 FINDINGS: CT HEAD FINDINGS Brain: No evidence of acute infarction, hemorrhage,  hydrocephalus, extra-axial collection or mass lesion/mass effect. Periventricular and deep white matter hypodensity. Vascular: No hyperdense vessel or unexpected calcification. Skull: Normal. Negative for fracture or focal lesion. Sinuses/Orbits: No acute finding. Other: None. CT CERVICAL SPINE FINDINGS Alignment: Postoperative straightening of the normal cervical lordosis. Skull base and vertebrae: No acute fracture. No primary bone lesion or focal pathologic process. Soft tissues and spinal canal: No prevertebral fluid or swelling. No visible canal hematoma. Disc levels: Anterior cervical discectomy and fusion of C4 through C7. Mild-to-moderate disc space height loss and osteophytosis of the remaining cervical levels. Upper chest: Negative. Other: None. IMPRESSION: 1. No acute intracranial pathology. 2. No fracture or static subluxation of the cervical spine. 3. Anterior cervical discectomy and fusion of C4 through C7. Mild-to-moderate disc space height loss and osteophytosis of the remaining cervical levels. Electronically Signed   By: Marolyn JONETTA Jaksch M.D.   On: 04/11/2024 21:42    Alm Schneider, DO  [facility]  If 7PM-7AM, please contact night-coverage [email or web address] Password [email or web address] 04/17/2024, 4:46 PM   LOS: 6 days

## 2024-04-17 NOTE — Progress Notes (Signed)
 Patients bathroom light was going off, this Clinical research associate went to assist patient. Noted patient had no oxygen on at this time, patient reported it was removed earlier due to oxygen saturation 97% on room air, patient assisted back to bed, patient stated she felt winded. Oxygen saturation 87% on room air, placed patient back on oxygen at 2 liter. Oxygen saturation now 94% on 2 liters. MD Tat made aware.

## 2024-04-17 NOTE — Plan of Care (Signed)
  Problem: Education: Goal: Knowledge of General Education information will improve Description: Including pain rating scale, medication(s)/side effects and non-pharmacologic comfort measures Outcome: Progressing   Problem: Clinical Measurements: Goal: Ability to maintain clinical measurements within normal limits will improve Outcome: Progressing   Problem: Elimination: Goal: Will not experience complications related to bowel motility Outcome: Progressing

## 2024-04-17 NOTE — Progress Notes (Addendum)
 Patients blood pressure 85/40 map 55, rechecked was 88/52 map 64. Patient received midodrine  5 mg at 1203. Md Tat made aware. New orders placed.

## 2024-04-17 NOTE — Progress Notes (Signed)
 SATURATION QUALIFICATIONS: (This note is used to comply with regulatory documentation for home oxygen)   Patient Saturations on Room Air at Rest = 93   Patient Saturations on Room Air while Ambulating = 87   Patient Saturations on 2 Liters of oxygen while Ambulating = 93   Please briefly explain why patient needs home oxygen: To maintain 02 sat at 90% or above during ambulation.   Alm Schneider, DO

## 2024-04-17 NOTE — Progress Notes (Signed)
 Patient very weak, unsteady this morning. 2 assist to do a standing weight. Bed alarm on.

## 2024-04-18 ENCOUNTER — Encounter (HOSPITAL_COMMUNITY): Payer: Self-pay | Admitting: Gastroenterology

## 2024-04-18 DIAGNOSIS — J9601 Acute respiratory failure with hypoxia: Secondary | ICD-10-CM | POA: Diagnosis not present

## 2024-04-18 DIAGNOSIS — I5033 Acute on chronic diastolic (congestive) heart failure: Secondary | ICD-10-CM | POA: Diagnosis not present

## 2024-04-18 DIAGNOSIS — J181 Lobar pneumonia, unspecified organism: Secondary | ICD-10-CM | POA: Diagnosis not present

## 2024-04-18 DIAGNOSIS — D649 Anemia, unspecified: Secondary | ICD-10-CM | POA: Diagnosis not present

## 2024-04-18 DIAGNOSIS — K219 Gastro-esophageal reflux disease without esophagitis: Secondary | ICD-10-CM

## 2024-04-18 DIAGNOSIS — K625 Hemorrhage of anus and rectum: Secondary | ICD-10-CM | POA: Diagnosis not present

## 2024-04-18 DIAGNOSIS — J84112 Idiopathic pulmonary fibrosis: Secondary | ICD-10-CM | POA: Diagnosis not present

## 2024-04-18 LAB — BASIC METABOLIC PANEL WITH GFR
Anion gap: 13 (ref 5–15)
BUN: 20 mg/dL (ref 8–23)
CO2: 23 mmol/L (ref 22–32)
Calcium: 9.5 mg/dL (ref 8.9–10.3)
Chloride: 105 mmol/L (ref 98–111)
Creatinine, Ser: 1.29 mg/dL — ABNORMAL HIGH (ref 0.44–1.00)
GFR, Estimated: 43 mL/min — ABNORMAL LOW (ref >60)
Glucose, Bld: 81 mg/dL (ref 70–99)
Potassium: 4.1 mmol/L (ref 3.5–5.1)
Sodium: 141 mmol/L (ref 135–145)

## 2024-04-18 LAB — CBC
HCT: 29.3 % — ABNORMAL LOW (ref 36.0–46.0)
Hemoglobin: 9.2 g/dL — ABNORMAL LOW (ref 12.0–15.0)
MCH: 31 pg (ref 26.0–34.0)
MCHC: 31.4 g/dL (ref 30.0–36.0)
MCV: 98.7 fL (ref 80.0–100.0)
Platelets: 223 K/uL (ref 150–400)
RBC: 2.97 MIL/uL — ABNORMAL LOW (ref 3.87–5.11)
RDW: 14 % (ref 11.5–15.5)
WBC: 4.7 K/uL (ref 4.0–10.5)
nRBC: 0 % (ref 0.0–0.2)

## 2024-04-18 LAB — MAGNESIUM: Magnesium: 2.5 mg/dL — ABNORMAL HIGH (ref 1.7–2.4)

## 2024-04-18 MED ORDER — MIDODRINE HCL 10 MG PO TABS: 10.0000 mg | ORAL_TABLET | Freq: Three times a day (TID) | ORAL | 1 refills | Status: DC

## 2024-04-18 MED ORDER — HYDROCORTISONE ACETATE 25 MG RE SUPP
25.0000 mg | Freq: Two times a day (BID) | RECTAL | Status: DC
  Filled 2024-04-18: qty 1

## 2024-04-18 MED ORDER — CEFDINIR 300 MG PO CAPS: 300.0000 mg | ORAL_CAPSULE | Freq: Two times a day (BID) | ORAL | 0 refills | Status: DC

## 2024-04-18 MED ORDER — ORAL CARE MOUTH RINSE: 15.0000 mL | OROMUCOSAL | Status: DC | PRN

## 2024-04-18 MED ORDER — DOXYCYCLINE HYCLATE 100 MG PO TABS: 100.0000 mg | ORAL_TABLET | Freq: Two times a day (BID) | ORAL | 0 refills | Status: DC

## 2024-04-18 MED ORDER — HYDROCORTISONE ACETATE 25 MG RE SUPP: 25.0000 mg | Freq: Two times a day (BID) | RECTAL | 0 refills | Status: DC

## 2024-04-18 MED ORDER — POLYETHYLENE GLYCOL 3350 17 G PO PACK: 17.0000 g | PACK | Freq: Every day | ORAL | Status: DC

## 2024-04-18 NOTE — NC FL2 (Addendum)
 [place]  MEDICAID FL2 LEVEL OF CARE FORM     IDENTIFICATION  Patient Name: [name] Birthdate: 08/24/1950 Sex: female Admission Date (Current Location): 04/11/2024  [facility] and [place] Number:  [employer] and Address:  [facility],  [address] [place] [address]      Provider Number: [id number]  Attending Physician Name and Address:  Evonnie Lenis, MD  Relative Name and Phone Number:  Wilbern Milling (Daughter)  [phone] (Mobile)    Current Level of Care: Hospital Recommended Level of Care: Assisted Living Facility Prior Approval Number:    Date Approved/Denied:   PASRR Number:    Discharge Plan: Domiciliary (Rest home) (ALF)    Current Diagnoses: Patient Active Problem List   Diagnosis Date Noted   Acute on chronic heart failure with preserved ejection fraction (HFpEF) (HCC) 04/16/2024   Acute respiratory failure with hypoxia (HCC) 04/15/2024   Lobar pneumonia 04/15/2024   CAP (community acquired pneumonia) 04/11/2024   Acute renal failure superimposed on stage 3b chronic kidney disease, unspecified acute renal failure type (HCC) 11/25/2023   Acute kidney injury superimposed on CKD 11/24/2023   Chronic diastolic HF (heart failure) (HCC) 10/23/2023   Acute hypoxemic respiratory failure (HCC) 10/20/2023   Status post carotid endarterectomy 07/03/2023   Symptomatic carotid artery stenosis, right 07/03/2023   Anxiety state 06/10/2023   Relational problem 06/10/2023   Acute coronary syndrome with high troponin (HCC) 11/20/2022   Chronic kidney disease stage IIIb 11/18/2022   Obesity (BMI 30-39.9) 11/18/2022   Rheumatoid arthritis (HCC) 11/17/2022   Fibromyalgia 11/17/2022   Hypothyroidism 11/17/2022   Progressive angina with severe left main stenosis 11/16/2022   IPF (idiopathic pulmonary fibrosis) (HCC) 02/25/2022   Abdominal pain, epigastric 11/26/2021   Nausea without vomiting 11/26/2021   Constipation 11/26/2021   Tobacco  abuse 11/16/2021   Abnormal chest CT 11/16/2021   Abnormal nuclear cardiac imaging test    Mixed incontinence 07/16/2021   History of UTI 07/16/2021   AMS (altered mental status) 03/11/2020   AKI (acute kidney injury) 03/11/2020   Hypokalemia 03/11/2020   Altered mental status 03/11/2020   Depression    Prolonged QT interval    Unilateral primary osteoarthritis, right knee 02/22/2020   Gastroesophageal reflux disease without esophagitis 01/25/2018   Encounter for colonoscopy due to history of adenomatous colonic polyps 03/23/2017   Melena 01/31/2014   Aftercare following surgery of the circulatory system, NEC 01/30/2014   Tooth pain 05/03/2013   GERD (gastroesophageal reflux disease) 02/16/2013   Bloating 02/16/2013   Renal artery stenosis 11/04/2012   PAD (peripheral artery disease) 05/07/2012   Shortness of breath 04/14/2012   Carotid artery stenosis 11/13/2009   Hyperlipidemia 04/19/2009   Essential hypertension 03/12/2009   CAD (coronary artery disease) 03/12/2009    Orientation RESPIRATION BLADDER Height & Weight     Self, Time, Situation, Place  Normal Continent Weight: 150 lb 12.7 oz (68.4 kg) Height:  4' 11 (149.9 cm)  BEHAVIORAL SYMPTOMS/MOOD NEUROLOGICAL BOWEL NUTRITION STATUS      Continent Diet (Regular)  AMBULATORY STATUS COMMUNICATION OF NEEDS Skin   Limited Assist Verbally Normal                       Personal Care Assistance Level of Assistance  Bathing, Feeding, Dressing Bathing Assistance: Limited assistance Feeding assistance: Independent Dressing Assistance: Limited assistance     Functional Limitations Info  Sight, Hearing, Speech Sight Info: Impaired Hearing Info: Adequate Speech Info: Adequate  SPECIAL CARE FACTORS FREQUENCY  PT (By licensed PT)     PT Frequency: 2-3 times a week              Contractures Contractures Info: Not present    Additional Factors Info  Code Status, Allergies Code Status Info: FULL Allergies  Info: Citalopram  Ciprofloxacin Hcl  Dicyclomine   Lamotrigine  Metoclopramide  Milnacipran Hcl  Omeprazole  Pregabalin  Serotonin Reuptake Inhibitors (Ssris)  Simvastatin   Tape  Tizanidine  Trazodone  And Nefazodone  Venlafaxine  Celecoxib  Codeine  Nefazodone  Sertraline  Hcl           Current Medications (04/18/2024):  This is the current hospital active medication list Current Facility-Administered Medications  Medication Dose Route Frequency Provider Last Rate Last Admin   acetaminophen  (TYLENOL ) tablet 1,000 mg  1,000 mg Oral Q6H PRN Keturah Carrier, MD   1,000 mg at 04/18/24 1512   albuterol  (PROVENTIL ) (2.5 MG/3ML) 0.083% nebulizer solution 2.5 mg  2.5 mg Nebulization Q4H PRN Segars, Jonathan, MD   2.5 mg at 04/12/24 1323   albuterol  (PROVENTIL ) (2.5 MG/3ML) 0.083% nebulizer solution 2.5 mg  2.5 mg Nebulization BID Krishnan, Gokul, MD   2.5 mg at 04/18/24 9271   ALPRAZolam  (XANAX ) tablet 0.5 mg  0.5 mg Oral BID Adefeso, Oladapo, DO   0.5 mg at 04/17/24 2119   aspirin  EC tablet 81 mg  81 mg Oral Daily Segars, Jonathan, MD   81 mg at 04/18/24 9145   bisacodyl  (DULCOLAX) suppository 10 mg  10 mg Rectal Daily PRN Segars, Jonathan, MD       cefTRIAXone  (ROCEPHIN ) 1 g in sodium chloride  0.9 % 100 mL IVPB  1 g Intravenous Q24H Tat, Alm, MD 200 mL/hr at 04/18/24 1327 1 g at 04/18/24 1327   clopidogrel  (PLAVIX ) tablet 75 mg  75 mg Oral Daily Segars, Jonathan, MD   75 mg at 04/18/24 0855   doxycycline (VIBRA-TABS) tablet 100 mg  100 mg Oral Q12H Tat, David, MD   100 mg at 04/18/24 0855   escitalopram (LEXAPRO) tablet 10 mg  10 mg Oral Daily Krishnan, Gokul, MD   10 mg at 04/18/24 9145   guaiFENesin  (MUCINEX ) 12 hr tablet 600 mg  600 mg Oral BID Segars, Jonathan, MD   600 mg at 04/18/24 0855   heparin  injection 5,000 Units  5,000 Units Subcutaneous Q8H Segars, Jonathan, MD   5,000 Units at 04/18/24 1324   hydrocortisone (ANUSOL-HC) suppository 25 mg  25 mg Rectal BID Shirlean Therisa ORN, NP        levothyroxine  (SYNTHROID ) tablet 25 mcg  25 mcg Oral QAC breakfast Segars, Jonathan, MD   25 mcg at 04/18/24 0531   lidocaine  (LIDODERM ) 5 % 2 patch  2 patch Transdermal Daily PRN Segars, Jonathan, MD   2 patch at 04/14/24 1026   melatonin tablet 6 mg  6 mg Oral QHS PRN Segars, Carrier, MD       methocarbamol (ROBAXIN) tablet 1,000 mg  1,000 mg Oral Q6H PRN Segars, Jonathan, MD   1,000 mg at 04/18/24 0033   midodrine  (PROAMATINE ) tablet 10 mg  10 mg Oral TID WC Evonnie Alm, MD   10 mg at 04/18/24 1145   mineral oil enema 1 enema  1 enema Rectal Once Segars, Jonathan, MD       ondansetron  (ZOFRAN ) injection 4 mg  4 mg Intravenous Q6H PRN Keturah Carrier, MD   4 mg at 04/18/24 1139   Oral care mouth rinse  15 mL Mouth  Rinse PRN Tat, Alm, MD       oxyCODONE  (Oxy IR/ROXICODONE ) immediate release tablet 5 mg  5 mg Oral Q6H PRN Krishnan, Gokul, MD   5 mg at 04/18/24 1024   pantoprazole  (PROTONIX ) EC tablet 40 mg  40 mg Oral Daily Segars, Jonathan, MD   40 mg at 04/18/24 0854   polyethylene glycol (MIRALAX / GLYCOLAX) packet 17 g  17 g Oral Daily PRN Keturah Carrier, MD       NOREEN ON 04/19/2024] polyethylene glycol (MIRALAX / GLYCOLAX) packet 17 g  17 g Oral Daily Shirlean Therisa ORN, NP       rosuvastatin  (CRESTOR ) tablet 40 mg  40 mg Oral Daily Segars, Jonathan, MD   40 mg at 04/18/24 0854   senna-docusate (Senokot-S) tablet 2 tablet  2 tablet Oral BID Krishnan, Gokul, MD   2 tablet at 04/17/24 2120   sodium chloride  flush (NS) 0.9 % injection 3 mL  3 mL Intravenous Q12H Segars, Jonathan, MD   3 mL at 04/18/24 0856   traZODone  (DESYREL ) tablet 100 mg  100 mg Oral QHS Segars, Jonathan, MD   100 mg at 04/17/24 2120     Discharge Medications: Allergies as of 04/18/2024       Reactions   Citalopram Other (See Comments), Palpitations   Heart rate increased   Ciprofloxacin Hcl Diarrhea, Nausea And Vomiting   Dicyclomine  Diarrhea, Other (See Comments)   Made diarrhea worse   Lamotrigine Diarrhea    Metoclopramide Other (See Comments)   Dizziness   Milnacipran Hcl Other (See Comments)   Unknown   Omeprazole Other (See Comments)   Unknown   Pecan Nut (diagnostic) Other (See Comments)   Unknown reaction, listed on MAR from facility   Pregabalin Other (See Comments)   Unknown   Serotonin Reuptake Inhibitors (ssris) Other (See Comments)   Unknown    Simvastatin  Other (See Comments)   Increased back and muscle pain   Tape Other (See Comments)   Skin gets red and skin pulls off, paper tape only   Tizanidine Other (See Comments)   Unknown    Trazodone  And Nefazodone Other (See Comments)   Hallucinations    Venlafaxine Other (See Comments)   Affected eyes, blurred vision   Celecoxib Itching   Codeine Itching   Nefazodone Other (See Comments)   Sertraline  Hcl Other (See Comments)   Stomach upset. Pt takes this med at home 11/2022        Medication List     STOP taking these medications    amoxicillin-clavulanate 500-125 MG tablet Commonly known as: AUGMENTIN   cyanocobalamin  1000 MCG tablet Commonly known as: VITAMIN B12       TAKE these medications    acetaminophen  325 MG tablet Commonly known as: TYLENOL  Take 325 mg by mouth every 6 (six) hours as needed for mild pain (pain score 1-3).   albuterol  108 (90 Base) MCG/ACT inhaler Commonly known as: VENTOLIN  HFA Inhale 1 puff into the lungs 4 (four) times daily as needed for wheezing or shortness of breath.   ALPRAZolam  0.5 MG tablet Commonly known as: XANAX  Take 0.5 mg by mouth 2 (two) times daily.   aspirin  EC 81 MG tablet Take 81 mg by mouth daily.   cefdinir 300 MG capsule Commonly known as: OMNICEF Take 1 capsule (300 mg total) by mouth 2 (two) times daily.   clopidogrel  75 MG tablet Commonly known as: PLAVIX  Take 1 tablet (75 mg total) by mouth daily.   dapagliflozin   propanediol 10 MG Tabs tablet Commonly known as: FARXIGA  Take 1 tablet (10 mg total) by mouth daily.   doxycycline 100 MG  tablet Commonly known as: VIBRA-TABS Take 1 tablet (100 mg total) by mouth every 12 (twelve) hours.   EPINEPHrine 0.3 mg/0.3 mL Soaj injection Commonly known as: EPI-PEN Inject 0.3 mg into the muscle as needed for anaphylaxis.   escitalopram 10 MG tablet Commonly known as: LEXAPRO Take 10 mg by mouth daily.   HYDROcodone  bit-homatropine 5-1.5 MG/5ML syrup Commonly known as: HYCODAN Take 5 mLs by mouth 3 (three) times daily as needed for cough.   hydrocortisone 25 MG suppository Commonly known as: ANUSOL-HC Place 1 suppository (25 mg total) rectally 2 (two) times daily.   levothyroxine  25 MCG tablet Commonly known as: SYNTHROID  Take 25 mcg by mouth daily before breakfast.   melatonin 3 MG Tabs tablet Take 3 mg by mouth at bedtime.   methocarbamol 500 MG tablet Commonly known as: ROBAXIN Take 500 mg by mouth every 6 (six) hours as needed (pain).   methocarbamol 500 MG tablet Commonly known as: ROBAXIN Take 1,000 mg by mouth in the morning and at bedtime.   midodrine  10 MG tablet Commonly known as: PROAMATINE  Take 1 tablet (10 mg total) by mouth 3 (three) times daily with meals. What changed:  medication strength how much to take   nitroGLYCERIN  0.4 MG SL tablet Commonly known as: NITROSTAT  Place 1 tablet (0.4 mg total) under the tongue every 5 (five) minutes x 3 doses as needed for chest pain (if no relief after 2nd dose, proceed to ED or call 911).   ondansetron  4 MG tablet Commonly known as: ZOFRAN  Take 4 mg by mouth 3 (three) times daily as needed for nausea or vomiting.   Oyster Shell Calcium  500 MG Tabs Take 1 tablet by mouth 2 (two) times daily.   pantoprazole  40 MG tablet Commonly known as: PROTONIX  Take 40 mg by mouth daily.   PreserVision AREDS 2 Caps Take 2 capsules by mouth daily.   rosuvastatin  40 MG tablet Commonly known as: CRESTOR  Take 1 tablet (40 mg total) by mouth daily.   torsemide  20 MG tablet Commonly known as: DEMADEX  Take 1 tablet  (20 mg total) by mouth daily.   traZODone  100 MG tablet Commonly known as: DESYREL  Take 100 mg by mouth at bedtime.   Vitamin D  (Ergocalciferol ) 1.25 MG (50000 UNIT) Caps capsule Commonly known as: DRISDOL  Take 50,000 Units by mouth every Monday.               Durable Medical Equipment  (From admission, onward)           Start     Ordered   04/18/24 1040  For home use only DME oxygen  Once       Comments: POC for CHF  Question Answer Comment  Length of Need 6 Months   Mode or (Route) Nasal cannula   Liters per Minute 2   Frequency Continuous (stationary and portable oxygen unit needed)   Oxygen conserving device Yes   Oxygen delivery system Gas      04/18/24 1039             Relevant Imaging Results:  Relevant Lab Results:   Additional Information SS# [id number]  Lucie Lunger, LCSWA

## 2024-04-18 NOTE — Progress Notes (Signed)
SATURATION QUALIFICATIONS: (This note is used to comply with regulatory documentation for home oxygen)  Patient Saturations on Room Air at Rest = 92%  Patient Saturations on Room Air while Ambulating = 87%  Patient Saturations on 2 Liters of oxygen while Ambulating = 94%   

## 2024-04-18 NOTE — Plan of Care (Signed)

## 2024-04-18 NOTE — Consult Note (Addendum)
 Gastroenterology Consult   Referring Provider: Dr. Evonnie  Primary Care Physician:  [name], MD Primary Gastroenterologist:  Dr. Eartha  Patient ID: [name]; [id number]; 1950/09/05   Admit date: 04/11/2024  LOS: 7 days   Date of Consultation: 04/18/2024  Reason for Consultation:  heme positive stool, episode of hematochezia   History of Present Illness   [name] is a 73 y.o. year old female  with a history of CAD, carotid artery disease with severe RICA stenosis s/p R-CEA (12/'24) anxiety, arthritis, CAD, depression, HTN, fibromyalgia, GERD, HLD, NSTEMI, renal artery stenosis, RLS, spondylosis, presenting this admission with CAP,  CAP, acute hypoxic respirator failure, acute on chronic heart failure. Patient reported rectal bleeding during admission but not appreciated by nursing staff. Heme positive stool this admission, with patient requesting GI consultation.   In ED, CT with large burden of stool and stool balls throughout colon and rectum.    Today: States was constipated with small balls prior to admission for awhile and continuing during hospitalization. If eats yogurt oletta), she will stay regular. She resides at [facility] currently and hasn't had yogurt in her mini fridge stocked. No rectal pain, itching, burning. Had been given Miralax BID and stool softener BID during this hospitalization with good results and now looser stool. States when the bowel movements hit, it hit a lot. Saw fresh blood in stool one episode when she started having BMs.   At home eats the Activia if she has this. She notes she started having balls of stool a week prior. Was taking OTC agent that worked well. Stool is now more looser with the miralax and senna dosing.   Felt like she was going to pass out this morning during her morning bath. States she has done this before in the past. Not on oxygen at [facility]. Denies any shortness of breath currently.   On Plavix   chronically. Tylenol , no Ibuprofen . Chronic headaches. GERD controlled on pantoprazole . Possible esophageal food dysphagia but she is a difficult historian regarding this.   Hgb 9.2 this morning. Has been stable for 72 hours in this range, fluctuating. Iron low at 22, iron sats low at 9, ferritin 226. Prior to admission, Hgb in the 12 range. She was 11.8 on admission, drifting down during hospitalization. No further findings of hematochezia.     Colonoscopy:05/08/17 - Preparation of the colon was poor. - Two small polyps at the hepatic flexure and in the ascending colon. Tubular adenoma - One 4 mm polyp in the ascending colon, tubular adenoma Last Endoscopy:7/21/15Erosive reflux esophagitis. Nonerosive antral gastritis. Biopsy taken for routine histology-neg h pylori   Recommendations:  Surveillance colonoscopy recommended in oct 2019   No FH colon cancer  Past Medical History:  Diagnosis Date   Abnormal chest CT    Anxiety    Arthritis    Rhumatoid and Osteoarthritis   Carotid artery disease    Dr. Serene, s/p left CEA, severe R ICA stenosis   Coronary atherosclerosis of native coronary artery    DES RCA/circumflex 4/10, LVEF 65 -> repeat cath 11/2021 with multivessel CAD including LM stenosis, initial med rx   Depression    Essential hypertension    Fibromyalgia    GERD (gastroesophageal reflux disease)    Hiatal hernia    Hyperlipidemia    Kidney cysts    NSTEMI (non-ST elevated myocardial infarction) (HCC)    4/10   Renal artery stenosis    Left renal artery stent 12/13  Restless leg syndrome    Sleep apnea    Spondylosis without myelopathy    Tobacco abuse     Past Surgical History:  Procedure Laterality Date   ABDOMINAL HYSTERECTOMY  07/15/1975   BREAST LUMPECTOMY     CAROTID ENDARTERECTOMY Left 02/28/2009   CHOLECYSTECTOMY  07/14/1989   COLONOSCOPY     COLONOSCOPY N/A 05/08/2017   Procedure: COLONOSCOPY;  Surgeon: Golda Claudis PENNER, MD;  [facility];  Service: Endoscopy;  Laterality: N/A;  12:00   CORONARY ATHERECTOMY N/A 11/19/2022   Procedure: CORONARY ATHERECTOMY;  Surgeon: Darron Deatrice LABOR, MD;  Location: [facility] CV [facility];  Service: Cardiovascular;  Laterality: N/A;   CORONARY STENT INTERVENTION N/A 11/19/2022   Procedure: CORONARY STENT INTERVENTION;  Surgeon: Darron Deatrice LABOR, MD;  Location: [facility];  Service: Cardiovascular;  Laterality: N/A;   CORONARY ULTRASOUND/IVUS N/A 11/19/2022   Procedure: Coronary Ultrasound/IVUS;  Surgeon: Darron Deatrice LABOR, MD;  [facility];  Service: Cardiovascular;  Laterality: N/A;   ENDARTERECTOMY Right 07/03/2023   Procedure: ENDARTERECTOMY CAROTID WITH XENOSURE BIOLOGIC PATCH ANGIOPLASTY;  Surgeon: Serene Gaile ORN, MD;  Location: [facility] OR;  Service: Vascular;  Laterality: Right;   ESOPHAGOGASTRODUODENOSCOPY N/A 04/07/2013   Procedure: ESOPHAGOGASTRODUODENOSCOPY (EGD);  Surgeon: Claudis PENNER Golda, MD;  [facility];  Service: Endoscopy;  Laterality: N/A;  250   ESOPHAGOGASTRODUODENOSCOPY N/A 02/02/2014   Procedure: ESOPHAGOGASTRODUODENOSCOPY (EGD);  Surgeon: Claudis PENNER Golda, MD;  [facility];  Service: Endoscopy;  Laterality: N/A;  120   EYE SURGERY     Catartact bilateral   INSERTION OF RETROGRADE CAROTID STENT Right 07/03/2023   Procedure: Right common carotid angiogram;  Surgeon: Serene Gaile ORN, MD;  Location: [facility] OR;  Service: Vascular;  Laterality: Right;   JOINT REPLACEMENT  09/12/2011   Left knee   JOINT REPLACEMENT  07/14/2004   Left Knee   kidney stent     Left December 2013   LEFT HEART CATH AND CORONARY ANGIOGRAPHY N/A 11/13/2021   Procedure: LEFT HEART CATH AND CORONARY ANGIOGRAPHY;  Surgeon: Darron Deatrice LABOR, MD;  [facility];  Service: Cardiovascular;  Laterality: N/A;   LEFT HEART CATH AND CORONARY ANGIOGRAPHY N/A 11/18/2022   Procedure: LEFT HEART CATH AND CORONARY ANGIOGRAPHY;  Surgeon: Anner Alm ORN,  MD;  Location: [facility] INVASIVE CV LAB;  Service: Cardiovascular;  Laterality: N/A;   LUMBAR DISC SURGERY     L5-S1   MULTIPLE TOOTH EXTRACTIONS  07/14/2013   Neck Fusion  07/14/1994   C4 to C6   PERCUTANEOUS STENT INTERVENTION Left 06/15/2012   Procedure: PERCUTANEOUS STENT INTERVENTION;  Surgeon: Gaile ORN Serene, MD;  Location: [facility] CATH LAB;  Service: Cardiovascular;  Laterality: Left;  renal   PERIPHERAL VASCULAR BALLOON ANGIOPLASTY  05/26/2017   Procedure: PERIPHERAL VASCULAR BALLOON ANGIOPLASTY;  Surgeon: Serene Gaile ORN, MD;  [facility];  Service: Cardiovascular;;  Lt. Renal Angioplasty   POLYPECTOMY  05/08/2017   Procedure: POLYPECTOMY;  Surgeon: Golda Claudis PENNER, MD;  [facility];  Service: Endoscopy;;  colon    RENAL ANGIOGRAM N/A 06/15/2012   Procedure: RENAL ANGIOGRAM;  Surgeon: Gaile ORN Serene, MD;  Location: [facility] CATH LAB;  Service: Cardiovascular;  Laterality: N/A;   RENAL ANGIOGRAPHY  05/26/2017   Procedure: RENAL ANGIOGRAPHY;  Surgeon: Serene Gaile ORN, MD;  [facility];  Service: Cardiovascular;;   SPINE SURGERY  07/14/1992   Ruptured disc   TEMPORARY  PACEMAKER N/A 11/19/2022   Procedure: TEMPORARY PACEMAKER;  Surgeon: Darron Deatrice LABOR, MD;  [facility];  Service: Cardiovascular;  Laterality: N/A;   TONSILECTOMY, ADENOIDECTOMY, BILATERAL MYRINGOTOMY AND TUBES  07/15/1967   TOTAL KNEE ARTHROPLASTY     Left   TUBAL LIGATION      Prior to Admission medications   Medication Sig Start Date End Date Taking? Authorizing Provider  acetaminophen  (TYLENOL ) 325 MG tablet Take 325 mg by mouth every 6 (six) hours as needed for mild pain (pain score 1-3). 11/11/23  Yes [provider]  albuterol  (VENTOLIN  HFA) 108 (90 Base) MCG/ACT inhaler Inhale 1 puff into the lungs 4 (four) times daily as needed for wheezing or shortness of breath. 04/22/21  Yes [provider]  ALPRAZolam  (XANAX ) 0.5 MG tablet Take 0.5 mg  by mouth 2 (two) times daily. 03/30/24  Yes [provider]  aspirin  81 MG EC tablet Take 81 mg by mouth daily.   Yes [provider]  clopidogrel  (PLAVIX ) 75 MG tablet Take 1 tablet (75 mg total) by mouth daily. 12/25/22  Yes Debera Jayson MATSU, MD  dapagliflozin  propanediol (FARXIGA ) 10 MG TABS tablet Take 1 tablet (10 mg total) by mouth daily. 10/27/23  Yes Arrien, Mauricio Daniel, MD  EPINEPHrine 0.3 mg/0.3 mL IJ SOAJ injection Inject 0.3 mg into the muscle as needed for anaphylaxis. 07/13/23  Yes [provider]  escitalopram (LEXAPRO) 10 MG tablet Take 10 mg by mouth daily. 03/18/24  Yes [provider]  HYDROcodone  bit-homatropine (HYCODAN) 5-1.5 MG/5ML syrup Take 5 mLs by mouth 3 (three) times daily as needed for cough. 04/04/24  Yes [provider]  levothyroxine  (SYNTHROID ) 25 MCG tablet Take 25 mcg by mouth daily before breakfast. 02/16/20  Yes [provider]  melatonin 3 MG TABS tablet Take 3 mg by mouth at bedtime.   Yes [provider]  methocarbamol (ROBAXIN) 500 MG tablet Take 1,000 mg by mouth in the morning and at bedtime. 11/23/23  Yes [provider]  methocarbamol (ROBAXIN) 500 MG tablet Take 500 mg by mouth every 6 (six) hours as needed (pain).  05/07/24 Yes [provider]  midodrine  (PROAMATINE ) 5 MG tablet Take 1 tablet (5 mg total) by mouth 3 (three) times daily with meals. 11/27/23  Yes Tat, Alm, MD  Multiple Vitamins-Minerals (PRESERVISION AREDS 2) CAPS Take 2 capsules by mouth daily.   Yes [provider]  nitroGLYCERIN  (NITROSTAT ) 0.4 MG SL tablet Place 1 tablet (0.4 mg total) under the tongue every 5 (five) minutes x 3 doses as needed for chest pain (if no relief after 2nd dose, proceed to ED or call 911). 06/08/23  Yes Debera Jayson MATSU, MD  ondansetron  (ZOFRAN ) 4 MG tablet Take 4 mg by mouth 3 (three) times daily as needed for nausea or vomiting. 06/03/21  Yes [provider]   Allie Gutta Calcium  500 MG TABS Take 1 tablet by mouth 2 (two) times daily. 09/22/23  Yes [provider]  pantoprazole  (PROTONIX ) 40 MG tablet Take 40 mg by mouth daily. 10/08/22  Yes [provider]  rosuvastatin  (CRESTOR ) 40 MG tablet Take 1 tablet (40 mg total) by mouth daily. 10/26/23  Yes Arrien, Elidia Sieving, MD  torsemide  (DEMADEX ) 20 MG tablet Take 1 tablet (20 mg total) by mouth daily. 11/28/23  Yes Tat, Alm, MD  traZODone  (DESYREL ) 100 MG tablet Take 100 mg by mouth at bedtime. 03/24/24  Yes [provider]  Vitamin D , Ergocalciferol , (DRISDOL ) 50000  UNITS CAPS Take 50,000 Units by mouth every Monday.    Yes [provider]  cyanocobalamin  (VITAMIN B12) 1000 MCG tablet Take 1 tablet (1,000 mcg total) by mouth daily. Patient not taking: Reported on 04/12/2024 10/26/23   Arrien, Mauricio Daniel, MD    Current Facility-Administered Medications  Medication Dose Route Frequency Provider Last Rate Last Admin   acetaminophen  (TYLENOL ) tablet 1,000 mg  1,000 mg Oral Q6H PRN Segars, Jonathan, MD   1,000 mg at 04/17/24 1704   albuterol  (PROVENTIL ) (2.5 MG/3ML) 0.083% nebulizer solution 2.5 mg  2.5 mg Nebulization Q4H PRN Segars, Jonathan, MD   2.5 mg at 04/12/24 1323   albuterol  (PROVENTIL ) (2.5 MG/3ML) 0.083% nebulizer solution 2.5 mg  2.5 mg Nebulization BID Krishnan, Gokul, MD   2.5 mg at 04/18/24 9271   ALPRAZolam  (XANAX ) tablet 0.5 mg  0.5 mg Oral BID Adefeso, Oladapo, DO   0.5 mg at 04/17/24 2119   aspirin  EC tablet 81 mg  81 mg Oral Daily Segars, Jonathan, MD   81 mg at 04/17/24 0932   bisacodyl  (DULCOLAX) suppository 10 mg  10 mg Rectal Daily PRN Segars, Jonathan, MD       cefTRIAXone  (ROCEPHIN ) 1 g in sodium chloride  0.9 % 100 mL IVPB  1 g Intravenous Q24H Evonnie Lenis, MD   Stopped at 04/17/24 1513   clopidogrel  (PLAVIX ) tablet 75 mg  75 mg Oral Daily Segars, Jonathan, MD   75 mg at 04/17/24 0932   doxycycline (VIBRA-TABS) tablet 100 mg  100 mg Oral  Q12H Tat, David, MD   100 mg at 04/17/24 2119   escitalopram (LEXAPRO) tablet 10 mg  10 mg Oral Daily Krishnan, Gokul, MD   10 mg at 04/17/24 0932   guaiFENesin  (MUCINEX ) 12 hr tablet 600 mg  600 mg Oral BID Segars, Jonathan, MD   600 mg at 04/17/24 2120   heparin  injection 5,000 Units  5,000 Units Subcutaneous Q8H Segars, Jonathan, MD   5,000 Units at 04/18/24 0531   levothyroxine  (SYNTHROID ) tablet 25 mcg  25 mcg Oral QAC breakfast Segars, Jonathan, MD   25 mcg at 04/18/24 0531   lidocaine  (LIDODERM ) 5 % 2 patch  2 patch Transdermal Daily PRN Segars, Jonathan, MD   2 patch at 04/14/24 1026   melatonin tablet 6 mg  6 mg Oral QHS PRN Segars, Jonathan, MD       methocarbamol (ROBAXIN) tablet 1,000 mg  1,000 mg Oral Q6H PRN Segars, Jonathan, MD   1,000 mg at 04/18/24 0033   midodrine  (PROAMATINE ) tablet 10 mg  10 mg Oral TID WC Evonnie Lenis, MD   10 mg at 04/17/24 1700   mineral oil enema 1 enema  1 enema Rectal Once Segars, Jonathan, MD       ondansetron  (ZOFRAN ) injection 4 mg  4 mg Intravenous Q6H PRN Segars, Jonathan, MD   4 mg at 04/18/24 0532   oxyCODONE  (Oxy IR/ROXICODONE ) immediate release tablet 5 mg  5 mg Oral Q6H PRN Krishnan, Gokul, MD   5 mg at 04/18/24 9682   pantoprazole  (PROTONIX ) EC tablet 40 mg  40 mg Oral Daily Segars, Dorn, MD   40 mg at 04/17/24 0932   polyethylene glycol (MIRALAX / GLYCOLAX) packet 17 g  17 g Oral Daily PRN Segars, Jonathan, MD       polyethylene glycol (MIRALAX / GLYCOLAX) packet 17 g  17 g Oral BID Segars, Jonathan, MD   17 g at 04/15/24 2145   rosuvastatin  (CRESTOR ) tablet 40 mg  40 mg Oral Daily Segars, Dorn, MD   40 mg at 04/17/24 0932   senna-docusate (Senokot-S) tablet 2 tablet  2 tablet Oral BID Verdene Purchase, MD   2 tablet at 04/17/24 2120   sodium chloride  flush (NS) 0.9 % injection 3 mL  3 mL Intravenous Q12H Keturah Dorn, MD   3 mL at 04/17/24 2120   traZODone  (DESYREL ) tablet 100 mg  100 mg Oral QHS Keturah Dorn, MD   100 mg at  04/17/24 2120    Allergies as of 04/11/2024 - Review Complete 04/11/2024  Allergen Reaction Noted   Celecoxib Itching 06/27/2022   Citalopram Other (See Comments) and Palpitations 05/21/2011   Ciprofloxacin hcl Other (See Comments) 04/25/2021   Dicyclomine  Diarrhea and Other (See Comments) 05/03/2013   Lamotrigine Diarrhea 03/28/2011   Metoclopramide Other (See Comments) 03/28/2011   Milnacipran hcl Other (See Comments) 01/18/2013   Omeprazole Other (See Comments) 04/25/2021   Pregabalin Other (See Comments) 06/27/2022   Serotonin reuptake inhibitors (ssris) Other (See Comments) 06/27/2022   Simvastatin  Other (See Comments) 08/04/2018   Tape Other (See Comments) 05/01/2017   Tizanidine Other (See Comments) 04/25/2021   Trazodone  and nefazodone Other (See Comments) 04/14/2012   Venlafaxine Other (See Comments) 06/27/2022   Codeine Itching    Nefazodone Other (See Comments) 09/04/2022   Sertraline  hcl Other (See Comments) 10/16/2011    Family History  Problem Relation Age of Onset   Other Mother        Cerebral hemorrage   Heart disease Mother    Heart attack Mother    Hyperlipidemia Mother    Heart disease Father    Heart attack Father    Hyperlipidemia Father    Heart disease Brother        Heart Disease before age 60   Diabetes Brother    Hypertension Brother    Heart attack Brother    Cancer Sister    Arthritis Sister    Heart disease Sister    Heart disease Daughter 29       Heart Disease before age 16   Heart attack Daughter    Hypertension Daughter    Cancer Sister    Arthritis Sister    Diabetes Brother        Varicose  Veins   Peripheral vascular disease Brother    Diabetes Brother     Social History   Socioeconomic History   Marital status: Divorced    Spouse name: Not on file   Number of children: Not on file   Years of education: Not on file   Highest education level: Not on file  Occupational History   Not on file  Tobacco Use   Smoking  status: Former    Current packs/day: 0.00    Types: Cigarettes    Start date: 02/18/1977    Quit date: 02/18/2022    Years since quitting: 2.1   Smokeless tobacco: Never  Vaping Use   Vaping status: Never Used  Substance and Sexual Activity   Alcohol  use: No    Alcohol /week: 0.0 standard drinks of alcohol    Drug use: No   Sexual activity: Not Currently  Other Topics Concern   Not on file  Social History Narrative   Not on file   Social Drivers of Health   Financial Resource Strain: Medium Risk (07/31/2023)   Received from [facility]   Overall Financial Resource Strain (CARDIA)    Difficulty of Paying Living Expenses: Somewhat hard  Food Insecurity: No  Food Insecurity (04/12/2024)   Hunger Vital Sign    Worried About Running Out of Food in the Last Year: Never true    Ran Out of Food in the Last Year: Never true  Transportation Needs: No Transportation Needs (04/12/2024)   PRAPARE - Administrator, Civil Service (Medical): No    Lack of Transportation (Non-Medical): No  Physical Activity: Not on file  Stress: Not on file  Social Connections: Unknown (04/18/2024)   Social Connection and Isolation Panel    Frequency of Communication with Friends and Family: Twice a week    Frequency of Social Gatherings with Friends and Family: Once a week    Attends Religious Services: 1 to 4 times per year    Active Member of [employer] or Organizations: No    Attends Banker Meetings: Never    Marital Status: Patient declined  Catering manager Violence: Not At Risk (04/12/2024)   Humiliation, Afraid, Rape, and Kick questionnaire    Fear of Current or Ex-Partner: No    Emotionally Abused: No    Physically Abused: No    Sexually Abused: No     Review of Systems   As in HPI  Physical Exam   Vital Signs in last 24 hours: Temp:  [97.6 F (36.4 C)-98.1 F (36.7 C)] 98.1 F (36.7 C) (10/06 0315) Pulse Rate:  [65-66] 66 (10/06 0031) Resp:  [17-20] 20 (10/05  1935) BP: (85-136)/(40-64) 120/46 (10/06 0315) SpO2:  [86 %-100 %] 86 % (10/06 0728) Weight:  [68.4 kg] 68.4 kg (10/06 0437) Last BM Date : 04/17/24  General:   Alert,  Well-developed, well-nourished, pleasant and cooperative in NAD Head:  Normocephalic and atraumatic. Eyes:  Sclera clear, no icterus.   Conjunctiva pink. Ears:  Normal auditory acuity. Lungs:  scattered rhonchi Heart:  S1 S2 present with systolic murmur Abdomen:  Soft, nontender and nondistended. No masses, hepatosplenomegaly or hernias noted. Normal bowel sounds, without guarding, and without rebound.  Bruising bilateral lower abdomen s/p heparin  injections Rectal: deferred   Msk:  Symmetrical without gross deformities. Normal posture. Extremities:  Without edema. Neurologic:  Alert and  oriented x4. Skin:  Intact without significant lesions or rashes. Psych:  Alert and cooperative. Normal mood and affect.  Intake/Output from previous day: 10/05 0701 - 10/06 0700 In: 861.9 [P.O.:720; I.V.:3; IV Piggyback:138.9] Out: -  Intake/Output this shift: No intake/output data recorded.   Labs/Studies   Recent Labs Recent Labs    04/16/24 0450 04/17/24 0439 04/18/24 0448  WBC 5.6 5.1 4.7  HGB 8.6* 8.2* 9.2*  HCT 27.1* 26.0* 29.3*  PLT 179 178 223   BMET Recent Labs    04/16/24 0450 04/17/24 0439 04/18/24 0448  NA 139 141 141  K 4.1 4.0 4.1  CL 103 105 105  CO2 24 23 23   GLUCOSE 98 90 81  BUN 22 22 20   CREATININE 1.46* 1.46* 1.29*  CALCIUM  8.8* 9.0 9.5    Radiology/Studies No results found.   Assessment   [name] is a 73 y.o. year old female with a history of CAD, carotid artery disease with severe RICA stenosis s/p R-CEA (12/'24) anxiety, arthritis, CAD, depression, HTN, fibromyalgia, GERD, HLD, NSTEMI, renal artery stenosis, RLS, spondylosis, presenting this admission with CAP,  CAP, acute hypoxic respirator failure, acute on chronic heart failure. Patient reported rectal bleeding during  admission but not appreciated by nursing staff. Heme positive stool this admission, with patient requesting GI consultation.   Low-volume hematochezia likely  due to benign anorectal source such as internal hemorrhoids in the setting of constipation. CT with large burden of stool and stool balls throughout colon and rectum. She has had good results with Miralax BID dosing and Senokot BID. No concerns for anal fissure. Recommend colonoscopy as outpatient; she is overdue for surveillance colonoscopy.   GERD is well-controlled on pantoprazole  as outpatient. Possible esophageal food dysphagia reported, but she is a difficult historian regarding this. Can consider EGD/dilation as outpatient.  Normocytic anemia: Hgb 11.8 on admission, drifting down during hospitalization and stable in the 9 range for about 72 hours. Appears last Hgb as outpatient was in the 12 range. Suspect hemodilutional component. She also has evidence for IDA component with low iron, low sats. Anemia multifactorial with chronic disease/IDA. Can follow this as outpatient.    Plan / Recommendations    Start Anusol BID Miralax daily Continue PPI daily Keep outpatient appt already scheduled for 10/14 with Mitzie Boettcher, NP Will need extended prep in light of poor prep in the past.      04/18/2024, 8:43 AM  Heather MICAEL Stager, PhD, ANP-BC [facility]

## 2024-04-18 NOTE — TOC Transition Note (Signed)
 Transition of Care [facility]) - Discharge Note   Patient Details  Name: [name] MRN: [id number] Date of Birth: 1951/05/26  [facility]) CM/SW Contact:  Lucie Lunger, LCSWA Phone Number: 04/18/2024, 4:15 PM   Clinical Narrative:    CSW updated pt is medically stable for D/C back to [facility]. Home O2 arranged through [place], CSW confirmed with Syliva at facility to confirm that O2 is at the facility. CSW updated pts daughter on plan for D/C. RN updated that facility will provide transport. D/C clinicals sent to facility, Tammy reviewed. TOC signing off.   Final next level of care: Home w Home Health Services Barriers to Discharge: Barriers Resolved   Patient Goals and CMS Choice Patient states their goals for this hospitalization and ongoing recovery are:: return to ALF CMS Medicare.gov Compare Post Acute Care list provided to:: Patient Choice offered to / list presented to : Patient [place] ownership interest in [facility].provided to:: Patient    Discharge Placement                Patient to be transferred to facility by: facility staff Name of family member notified: daughter Patient and family notified of of transfer: 04/18/24  Discharge Plan and Services Additional resources added to the After Visit Summary for       Post Acute Care Choice: Home Health                               Social Drivers of Health (SDOH) Interventions SDOH Screenings   Food Insecurity: No Food Insecurity (04/12/2024)  Housing: Low Risk  (04/18/2024)  Transportation Needs: No Transportation Needs (04/12/2024)  Utilities: Not At Risk (04/12/2024)  Depression (PHQ2-9): Low Risk  (03/11/2020)  Financial Resource Strain: Medium Risk (07/31/2023)   Received from [facility]  Social Connections: Unknown (04/18/2024)  Tobacco Use: Medium Risk (04/11/2024)  Health Literacy: High Risk (07/31/2023)   Received from [facility]      Readmission Risk Interventions    04/12/2024   11:48 AM 11/27/2023    7:51 AM 10/20/2023    8:13 PM  Readmission Risk Prevention Plan  Transportation Screening Complete Complete Complete  PCP or Specialist Appt within 3-5 Days Not Complete  Complete  HRI or Home Care Consult Complete  Complete  Social Work Consult for Recovery Care Planning/Counseling Complete  Complete  Palliative Care Screening Not Applicable  Not Applicable  Medication Review Oceanographer) Complete Complete Complete  HRI or Home Care Consult  Complete   SW Recovery Care/Counseling Consult  Complete   Palliative Care Screening  Not Applicable   Skilled Nursing Facility  Not Applicable

## 2024-04-18 NOTE — Discharge Summary (Signed)
 Physician Discharge Summary   Patient: [name] MRN: [id number] DOB: 04-25-1951  Admit date:     04/11/2024  Discharge date: 04/18/24  Discharge Physician: Alm Oluwaseun Cremer   PCP: [name], MD   Recommendations at discharge:   Please follow up with primary care provider within 1-2 weeks  Please repeat BMP and CBC in one week    Hospital Course: 73 y/o female with a history of CAD, carotid artery disease with severe RICA stenosis s/p R-CEA (12//20/2024), IPF, HTN, renal artery stenosis, GERD, fibromyalgia, rheumatoid arthritis, hypothyroidism, CKD stage IIIb, HFpEFand HLD presenting diffuse pain body particularly headache, neck pain, chest pain, abdominal pain.  She also complained of some shortness of breath.  The patient resides at [facility].  She states that she was recently given a course of antibiotics, Augmentin for which she felt to be pneumonia. She denied any nausea, vomiting or diarrhea, abdominal pain.  She has nonproductive cough. The patient has had recent hospitalizations from 10/20/2023 to 10/26/2023 for acute on chronic HFpEF. She was discharged to SNF with torsemide  40 mg daily.  She was subsequently admitted from 11/24/2023 to 11/27/2023 secondary to acute on chronic renal failure, CKD stage IIIb as well as vertigo and dizziness.  It was felt that her vertigo and dizziness was multifactorial including orthostatic hypotension, volume depletion, BPPV.  She was discharged on midodrine .  In the ED, the patient was hypoxic in the 80s.  She was tachypneic.  He was hemodynamically stable.  WBC 9.8, hemoglobin 11.8, plates 837.  CTA chest was negative for PE but showed consolidative opacity in the right lower lobe with a small right effusion.  There was shotty mediastinal lymphadenopathy.  CT of the abdomen and pelvis showed abundant stool, but there was no other acute findings.  CT of the brain was negative for acute findings.  CT of the cervical spine was negative for any fracture  or dislocation.  The patient was started on ceftriaxone  and azithromycin.  Assessment and Plan: Acute respiratory failure with hypoxia - Secondary to pneumonia and fluid overload - Presented with tachypnea and hypoxia - Stable on 2 L - Wean oxygen as tolerated - desaturated to 87 % with ambulation on RA--d/c with 2L   Lobar pneumonia - CTA chest as discussed above - Continue IV abx during hospitalization - d/c home with cefdinir and doxy x 3 more days to complete 7 day course   Acute on chronic HFpEF - The patient had some signs of fluid overload - redosed IV furosemide  10/3, 10/4 - Accurate I's and O's--incomplete - Daily weights--NEG 6 lbs - 10/21/2023 echo EF 65 to 70%, no WMA, normal RVF, trivial MR - d/c home with her prior dose torsemide    Heme Positive stool -pt claims to have hematochezia, but not seen by nursing -stool heme positive, Hgb stable -pt requesting GI consult -GI saw patient, felt she was appropriate for outpt colonoscopy;  pt already has outpt follow up appt with [facility] 04/26/24   CKD stage IIIb - Baseline creatinine 1.4-1.7 - Monitor with diuresis - serum creatinine 1.29 on day of d/c   Abdominal pain - CT abdomen pelvis as discussed above - Secondary to constipation - Continue cathartics>>improved with BMs   Pulmonary fibrosis Noted on CT angiogram.  Outpatient follow-up with pulmonology.   Hypothyroidism - Continue Synthroid    Coronary Artery Disease - No chest pain presently - Continue statin, ASA, plavix    Peripheral arterial disease -s/p R-CEA 06/2023 -continue ASA, plavix ,  statin   Orthostatic hypotension - Continue midodrine --increase to 10 mg TID - As needed meclizine   Mixed hyperlipidemia - Continue statin   Rheumatoid arthritis - No active flare presently - Currently not on any DMARDs   Depression/anxiety - Continue home dose alprazolam  - PDMP reviewed--alprazolam , #71, last refill 03/30/2024   Obesity -BMI  31.21 -Lifestyle modification         Consultants: none Procedures performed: none  Disposition: Home Diet recommendation:  Cardiac diet DISCHARGE MEDICATION: Allergies as of 04/18/2024       Reactions   Citalopram Other (See Comments), Palpitations   Heart rate increased   Ciprofloxacin Hcl Diarrhea, Nausea And Vomiting   Dicyclomine  Diarrhea, Other (See Comments)   Made diarrhea worse   Lamotrigine Diarrhea   Metoclopramide Other (See Comments)   Dizziness   Milnacipran Hcl Other (See Comments)   Unknown   Omeprazole Other (See Comments)   Unknown   Pecan Nut (diagnostic) Other (See Comments)   Unknown reaction, listed on MAR from facility   Pregabalin Other (See Comments)   Unknown   Serotonin Reuptake Inhibitors (ssris) Other (See Comments)   Unknown    Simvastatin  Other (See Comments)   Increased back and muscle pain   Tape Other (See Comments)   Skin gets red and skin pulls off, paper tape only   Tizanidine Other (See Comments)   Unknown    Trazodone  And Nefazodone Other (See Comments)   Hallucinations    Venlafaxine Other (See Comments)   Affected eyes, blurred vision   Celecoxib Itching   Codeine Itching   Nefazodone Other (See Comments)   Sertraline  Hcl Other (See Comments)   Stomach upset. Pt takes this med at home 11/2022        Medication List     STOP taking these medications    amoxicillin-clavulanate 500-125 MG tablet Commonly known as: AUGMENTIN   cyanocobalamin  1000 MCG tablet Commonly known as: VITAMIN B12       TAKE these medications    acetaminophen  325 MG tablet Commonly known as: TYLENOL  Take 325 mg by mouth every 6 (six) hours as needed for mild pain (pain score 1-3).   albuterol  108 (90 Base) MCG/ACT inhaler Commonly known as: VENTOLIN  HFA Inhale 1 puff into the lungs 4 (four) times daily as needed for wheezing or shortness of breath.   ALPRAZolam  0.5 MG tablet Commonly known as: XANAX  Take 0.5 mg by mouth 2 (two)  times daily.   aspirin  EC 81 MG tablet Take 81 mg by mouth daily.   cefdinir 300 MG capsule Commonly known as: OMNICEF Take 1 capsule (300 mg total) by mouth 2 (two) times daily.   clopidogrel  75 MG tablet Commonly known as: PLAVIX  Take 1 tablet (75 mg total) by mouth daily.   dapagliflozin  propanediol 10 MG Tabs tablet Commonly known as: FARXIGA  Take 1 tablet (10 mg total) by mouth daily.   doxycycline 100 MG tablet Commonly known as: VIBRA-TABS Take 1 tablet (100 mg total) by mouth every 12 (twelve) hours.   EPINEPHrine 0.3 mg/0.3 mL Soaj injection Commonly known as: EPI-PEN Inject 0.3 mg into the muscle as needed for anaphylaxis.   escitalopram 10 MG tablet Commonly known as: LEXAPRO Take 10 mg by mouth daily.   HYDROcodone  bit-homatropine 5-1.5 MG/5ML syrup Commonly known as: HYCODAN Take 5 mLs by mouth 3 (three) times daily as needed for cough.   hydrocortisone 25 MG suppository Commonly known as: ANUSOL-HC Place 1 suppository (25 mg total) rectally 2 (two)  times daily.   levothyroxine  25 MCG tablet Commonly known as: SYNTHROID  Take 25 mcg by mouth daily before breakfast.   melatonin 3 MG Tabs tablet Take 3 mg by mouth at bedtime.   methocarbamol 500 MG tablet Commonly known as: ROBAXIN Take 500 mg by mouth every 6 (six) hours as needed (pain).   methocarbamol 500 MG tablet Commonly known as: ROBAXIN Take 1,000 mg by mouth in the morning and at bedtime.   midodrine  10 MG tablet Commonly known as: PROAMATINE  Take 1 tablet (10 mg total) by mouth 3 (three) times daily with meals. What changed:  medication strength how much to take   nitroGLYCERIN  0.4 MG SL tablet Commonly known as: NITROSTAT  Place 1 tablet (0.4 mg total) under the tongue every 5 (five) minutes x 3 doses as needed for chest pain (if no relief after 2nd dose, proceed to ED or call 911).   ondansetron  4 MG tablet Commonly known as: ZOFRAN  Take 4 mg by mouth 3 (three) times daily as  needed for nausea or vomiting.   Oyster Shell Calcium  500 MG Tabs Take 1 tablet by mouth 2 (two) times daily.   pantoprazole  40 MG tablet Commonly known as: PROTONIX  Take 40 mg by mouth daily.   PreserVision AREDS 2 Caps Take 2 capsules by mouth daily.   rosuvastatin  40 MG tablet Commonly known as: CRESTOR  Take 1 tablet (40 mg total) by mouth daily.   torsemide  20 MG tablet Commonly known as: DEMADEX  Take 1 tablet (20 mg total) by mouth daily.   traZODone  100 MG tablet Commonly known as: DESYREL  Take 100 mg by mouth at bedtime.   Vitamin D  (Ergocalciferol ) 1.25 MG (50000 UNIT) Caps capsule Commonly known as: DRISDOL  Take 50,000 Units by mouth every Monday.               Durable Medical Equipment  (From admission, onward)           Start     Ordered   04/18/24 1040  For home use only DME oxygen  Once       Comments: POC for CHF  Question Answer Comment  Length of Need 6 Months   Mode or (Route) Nasal cannula   Liters per Minute 2   Frequency Continuous (stationary and portable oxygen unit needed)   Oxygen conserving device Yes   Oxygen delivery system Gas      04/18/24 1039            Contact information for after-discharge care     Destination     [facility] .   Service: Assisted Living Contact information: [address] [place]  [address] [phone]                    Discharge Exam: Filed Weights   04/16/24 0602 04/17/24 0544 04/18/24 0437  Weight: 70.1 kg 68.4 kg 68.4 kg   HEENT:  [place]/AT, No thrush, no icterus CV:  RRR, no rub, no S3, no S4 Lung:  bibasilar rales.  No wheeze Abd:  soft/+BS, NT Ext:  trace LE edema, no lymphangitis, no synovitis, no rash   Condition at discharge: stable  The results of significant diagnostics from this hospitalization (including imaging, microbiology, ancillary and laboratory) are listed below for reference.   Imaging Studies: CT Angio  Chest PE W and/or Wo Contrast Result Date: 04/11/2024 CLINICAL DATA:  PE suspected, chest and abdominal pain EXAM: CT ANGIOGRAPHY CHEST CT ABDOMEN AND PELVIS WITH  CONTRAST TECHNIQUE: Multidetector CT imaging of the chest was performed using the standard protocol during bolus administration of intravenous contrast. Multiplanar CT image reconstructions and MIPs were obtained to evaluate the vascular anatomy. Multidetector CT imaging of the abdomen and pelvis was performed using the standard protocol during bolus administration of intravenous contrast. RADIATION DOSE REDUCTION: This exam was performed according to the departmental dose-optimization program which includes automated exposure control, adjustment of the mA and/or kV according to patient size and/or use of iterative reconstruction technique. CONTRAST:  75mL OMNIPAQUE  IOHEXOL  350 MG/ML SOLN COMPARISON:  CT chest angiogram, 10/22/2023 FINDINGS: CT CHEST ANGIOGRAM FINDINGS Cardiovascular: Satisfactory opacification of the pulmonary arteries to the segmental level. No evidence of pulmonary embolism. Normal heart size. Extensive three-vessel coronary artery calcifications. No pericardial effusion. Severe aortic atherosclerosis. Mediastinum/Nodes: Numerous enlarged mediastinal and bilateral hilar lymph nodes, pretracheal nodes measuring up to 1.6 x 1.5 cm (series 8, image 66) thyroid  gland, trachea, and esophagus demonstrate no significant findings. Lungs/Pleura: Heterogeneous and consolidative airspace opacity in the right lung base with a small associated right pleural effusion (series 9, image 71). Unchanged mild bibasilar predominant pulmonary fibrosis. Minimal paraseptal emphysema. Musculoskeletal: No chest wall abnormality. No acute osseous findings. Review of the MIP images confirms the above findings. CT ABDOMEN PELVIS FINDINGS Hepatobiliary: No focal liver abnormality is seen. Status post cholecystectomy. Postoperative biliary dilatation. Pancreas:  Unremarkable. No pancreatic ductal dilatation or surrounding inflammatory changes. Spleen: Normal in size without significant abnormality. Adrenals/Urinary Tract: Adrenal glands are unremarkable. Simple, benign bilateral renal cortical cysts for which no further follow-up or characterization is required. Kidneys are otherwise normal, without renal calculi, solid lesion, or hydronephrosis. Bladder is unremarkable. Stomach/Bowel: Stomach is within normal limits. Appendix not clearly visualized. No evidence of bowel wall thickening, distention, or inflammatory changes. Large burden of stool and stool balls throughout the colon and rectum Vascular/Lymphatic: Severe aortic atherosclerosis. No enlarged abdominal or pelvic lymph nodes. Reproductive: Hysterectomy. Other: No abdominal wall hernia or abnormality. No ascites. Musculoskeletal: No acute or significant osseous findings. IMPRESSION: 1. Negative examination for pulmonary embolism. 2. Heterogeneous and consolidative airspace opacity in the right lung base with a small associated right pleural effusion, consistent with infection or aspiration. 3. Numerous enlarged mediastinal and bilateral hilar lymph nodes, likely reactive. 4. 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 5. Large burden of stool and stool balls throughout the colon and rectum. 6. Coronary artery disease. Aortic Atherosclerosis (ICD10-I70.0) and Emphysema (ICD10-J43.9). Electronically Signed   By: Marolyn JONETTA Jaksch M.D.   On: 04/11/2024 21:49   CT ABDOMEN PELVIS W CONTRAST Result Date: 04/11/2024 CLINICAL DATA:  PE suspected, chest and abdominal pain EXAM: CT ANGIOGRAPHY CHEST CT ABDOMEN AND PELVIS WITH CONTRAST TECHNIQUE: Multidetector CT imaging of the chest was performed using the standard protocol during bolus administration of intravenous contrast. Multiplanar CT image reconstructions and MIPs were obtained to evaluate the vascular  anatomy. Multidetector CT imaging of the abdomen and pelvis was performed using the standard protocol during bolus administration of intravenous contrast. RADIATION DOSE REDUCTION: This exam was performed according to the departmental dose-optimization program which includes automated exposure control, adjustment of the mA and/or kV according to patient size and/or use of iterative reconstruction technique. CONTRAST:  75mL OMNIPAQUE  IOHEXOL  350 MG/ML SOLN COMPARISON:  CT chest angiogram, 10/22/2023 FINDINGS: CT CHEST ANGIOGRAM FINDINGS Cardiovascular: Satisfactory opacification of the pulmonary arteries to the segmental level. No evidence of pulmonary embolism. Normal heart size. Extensive three-vessel coronary artery calcifications. No pericardial  effusion. Severe aortic atherosclerosis. Mediastinum/Nodes: Numerous enlarged mediastinal and bilateral hilar lymph nodes, pretracheal nodes measuring up to 1.6 x 1.5 cm (series 8, image 66) thyroid  gland, trachea, and esophagus demonstrate no significant findings. Lungs/Pleura: Heterogeneous and consolidative airspace opacity in the right lung base with a small associated right pleural effusion (series 9, image 71). Unchanged mild bibasilar predominant pulmonary fibrosis. Minimal paraseptal emphysema. Musculoskeletal: No chest wall abnormality. No acute osseous findings. Review of the MIP images confirms the above findings. CT ABDOMEN PELVIS FINDINGS Hepatobiliary: No focal liver abnormality is seen. Status post cholecystectomy. Postoperative biliary dilatation. Pancreas: Unremarkable. No pancreatic ductal dilatation or surrounding inflammatory changes. Spleen: Normal in size without significant abnormality. Adrenals/Urinary Tract: Adrenal glands are unremarkable. Simple, benign bilateral renal cortical cysts for which no further follow-up or characterization is required. Kidneys are otherwise normal, without renal calculi, solid lesion, or hydronephrosis. Bladder is  unremarkable. Stomach/Bowel: Stomach is within normal limits. Appendix not clearly visualized. No evidence of bowel wall thickening, distention, or inflammatory changes. Large burden of stool and stool balls throughout the colon and rectum Vascular/Lymphatic: Severe aortic atherosclerosis. No enlarged abdominal or pelvic lymph nodes. Reproductive: Hysterectomy. Other: No abdominal wall hernia or abnormality. No ascites. Musculoskeletal: No acute or significant osseous findings. IMPRESSION: 1. Negative examination for pulmonary embolism. 2. Heterogeneous and consolidative airspace opacity in the right lung base with a small associated right pleural effusion, consistent with infection or aspiration. 3. Numerous enlarged mediastinal and bilateral hilar lymph nodes, likely reactive. 4. 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 5. Large burden of stool and stool balls throughout the colon and rectum. 6. Coronary artery disease. Aortic Atherosclerosis (ICD10-I70.0) and Emphysema (ICD10-J43.9). Electronically Signed   By: Marolyn JONETTA Jaksch M.D.   On: 04/11/2024 21:49   CT Head Wo Contrast Result Date: 04/11/2024 CLINICAL DATA:  Head and neck pain, no known injury EXAM: CT HEAD WITHOUT CONTRAST CT CERVICAL SPINE WITHOUT CONTRAST TECHNIQUE: Multidetector CT imaging of the head and cervical spine was performed following the standard protocol without intravenous contrast. Multiplanar CT image reconstructions of the cervical spine were also generated. RADIATION DOSE REDUCTION: This exam was performed according to the departmental dose-optimization program which includes automated exposure control, adjustment of the mA and/or kV according to patient size and/or use of iterative reconstruction technique. COMPARISON:  MR brain, 11/26/2023 FINDINGS: CT HEAD FINDINGS Brain: No evidence of acute infarction, hemorrhage, hydrocephalus, extra-axial collection or mass  lesion/mass effect. Periventricular and deep white matter hypodensity. Vascular: No hyperdense vessel or unexpected calcification. Skull: Normal. Negative for fracture or focal lesion. Sinuses/Orbits: No acute finding. Other: None. CT CERVICAL SPINE FINDINGS Alignment: Postoperative straightening of the normal cervical lordosis. Skull base and vertebrae: No acute fracture. No primary bone lesion or focal pathologic process. Soft tissues and spinal canal: No prevertebral fluid or swelling. No visible canal hematoma. Disc levels: Anterior cervical discectomy and fusion of C4 through C7. Mild-to-moderate disc space height loss and osteophytosis of the remaining cervical levels. Upper chest: Negative. Other: None. IMPRESSION: 1. No acute intracranial pathology. 2. No fracture or static subluxation of the cervical spine. 3. Anterior cervical discectomy and fusion of C4 through C7. Mild-to-moderate disc space height loss and osteophytosis of the remaining cervical levels. Electronically Signed   By: Marolyn JONETTA Jaksch M.D.   On: 04/11/2024 21:42   CT Cervical Spine Wo Contrast Result Date: 04/11/2024 CLINICAL DATA:  Head and neck pain, no known injury EXAM: CT HEAD WITHOUT CONTRAST CT CERVICAL SPINE  WITHOUT CONTRAST TECHNIQUE: Multidetector CT imaging of the head and cervical spine was performed following the standard protocol without intravenous contrast. Multiplanar CT image reconstructions of the cervical spine were also generated. RADIATION DOSE REDUCTION: This exam was performed according to the departmental dose-optimization program which includes automated exposure control, adjustment of the mA and/or kV according to patient size and/or use of iterative reconstruction technique. COMPARISON:  MR brain, 11/26/2023 FINDINGS: CT HEAD FINDINGS Brain: No evidence of acute infarction, hemorrhage, hydrocephalus, extra-axial collection or mass lesion/mass effect. Periventricular and deep white matter hypodensity. Vascular: No  hyperdense vessel or unexpected calcification. Skull: Normal. Negative for fracture or focal lesion. Sinuses/Orbits: No acute finding. Other: None. CT CERVICAL SPINE FINDINGS Alignment: Postoperative straightening of the normal cervical lordosis. Skull base and vertebrae: No acute fracture. No primary bone lesion or focal pathologic process. Soft tissues and spinal canal: No prevertebral fluid or swelling. No visible canal hematoma. Disc levels: Anterior cervical discectomy and fusion of C4 through C7. Mild-to-moderate disc space height loss and osteophytosis of the remaining cervical levels. Upper chest: Negative. Other: None. IMPRESSION: 1. No acute intracranial pathology. 2. No fracture or static subluxation of the cervical spine. 3. Anterior cervical discectomy and fusion of C4 through C7. Mild-to-moderate disc space height loss and osteophytosis of the remaining cervical levels. Electronically Signed   By: Marolyn JONETTA Jaksch M.D.   On: 04/11/2024 21:42    Microbiology: Results for orders placed or performed during the hospital encounter of 04/11/24  Resp panel by RT-PCR (RSV, Flu A&B, Covid) Anterior Nasal Swab     Status: None   Collection Time: 04/11/24  6:25 PM   Specimen: Anterior Nasal Swab  Result Value Ref Range Status   SARS Coronavirus 2 by RT PCR NEGATIVE NEGATIVE Final    Comment: (NOTE) SARS-CoV-2 target nucleic acids are NOT DETECTED.  The SARS-CoV-2 RNA is generally detectable in upper respiratory specimens during the acute phase of infection. The lowest concentration of SARS-CoV-2 viral copies this assay can detect is 138 copies/mL. A negative result does not preclude SARS-Cov-2 infection and should not be used as the sole basis for treatment or other patient management decisions. A negative result may occur with  improper specimen collection/handling, submission of specimen other than nasopharyngeal swab, presence of viral mutation(s) within the areas targeted by this assay, and  inadequate number of viral copies(<138 copies/mL). A negative result must be combined with clinical observations, patient history, and epidemiological information. The expected result is Negative.  Fact Sheet for Patients:  BloggerCourse.com  Fact Sheet for Healthcare Providers:  SeriousBroker.it  This test is no t yet approved or cleared by the [place]  FDA and  has been authorized for detection and/or diagnosis of SARS-CoV-2 by FDA under an Emergency Use Authorization (EUA). This EUA will remain  in effect (meaning this test can be used) for the duration of the COVID-19 declaration under Section 564(b)(1) of the Act, 21 U.S.C.section 360bbb-3(b)(1), unless the authorization is terminated  or revoked sooner.       Influenza A by PCR NEGATIVE NEGATIVE Final   Influenza B by PCR NEGATIVE NEGATIVE Final    Comment: (NOTE) The Xpert Xpress SARS-CoV-2/FLU/RSV plus assay is intended as an aid in the diagnosis of influenza from Nasopharyngeal swab specimens and should not be used as a sole basis for treatment. Nasal washings and aspirates are unacceptable for Xpert Xpress SARS-CoV-2/FLU/RSV testing.  Fact Sheet for Patients: BloggerCourse.com  Fact Sheet for Healthcare Providers: SeriousBroker.it  This test is not yet  approved or cleared by the [place]  FDA and has been authorized for detection and/or diagnosis of SARS-CoV-2 by FDA under an Emergency Use Authorization (EUA). This EUA will remain in effect (meaning this test can be used) for the duration of the COVID-19 declaration under Section 564(b)(1) of the Act, 21 U.S.C. section 360bbb-3(b)(1), unless the authorization is terminated or revoked.     Resp Syncytial Virus by PCR NEGATIVE NEGATIVE Final    Comment: (NOTE) Fact Sheet for Patients: BloggerCourse.com  Fact Sheet for Healthcare  Providers: SeriousBroker.it  This test is not yet approved or cleared by the [place]  FDA and has been authorized for detection and/or diagnosis of SARS-CoV-2 by FDA under an Emergency Use Authorization (EUA). This EUA will remain in effect (meaning this test can be used) for the duration of the COVID-19 declaration under Section 564(b)(1) of the Act, 21 U.S.C. section 360bbb-3(b)(1), unless the authorization is terminated or revoked.  Performed at [facility], [address], [place], [place] [address]   Blood Culture (routine x 2)     Status: None   Collection Time: 04/11/24  8:15 PM   Specimen: Left Antecubital; Blood  Result Value Ref Range Status   Specimen Description LEFT ANTECUBITAL  Final   Special Requests   Final    BOTTLES DRAWN AEROBIC AND ANAEROBIC Blood Culture adequate volume   Culture   Final    NO GROWTH 5 DAYS Performed at [facility], [address] [address] [place], [place] [address]    Report Status 04/16/2024 FINAL  Final  Blood Culture (routine x 2)     Status: None   Collection Time: 04/11/24  8:42 PM   Specimen: BLOOD  Result Value Ref Range Status   Specimen Description BLOOD BLOOD RIGHT ARM  Final   Special Requests NONE  Final   Culture   Final    NO GROWTH 5 DAYS Performed at [facility], [address], [place], [place] [address]    Report Status 04/16/2024 FINAL  Final  Respiratory (~20 pathogens) panel by PCR     Status: None   Collection Time: 04/12/24  3:21 AM   Specimen: Nasopharyngeal Swab; Respiratory  Result Value Ref Range Status   Adenovirus NOT DETECTED NOT DETECTED Final   Coronavirus 229E NOT DETECTED NOT DETECTED Final    Comment: (NOTE) The Coronavirus on the Respiratory Panel, DOES NOT test for the novel  Coronavirus (2019 nCoV)    Coronavirus HKU1 NOT DETECTED NOT DETECTED Final   Coronavirus NL63 NOT DETECTED NOT DETECTED Final   Coronavirus OC43 NOT DETECTED NOT DETECTED Final   Metapneumovirus  NOT DETECTED NOT DETECTED Final   Rhinovirus / Enterovirus NOT DETECTED NOT DETECTED Final   Influenza A NOT DETECTED NOT DETECTED Final   Influenza B NOT DETECTED NOT DETECTED Final   Parainfluenza Virus 1 NOT DETECTED NOT DETECTED Final   Parainfluenza Virus 2 NOT DETECTED NOT DETECTED Final   Parainfluenza Virus 3 NOT DETECTED NOT DETECTED Final   Parainfluenza Virus 4 NOT DETECTED NOT DETECTED Final   Respiratory Syncytial Virus NOT DETECTED NOT DETECTED Final   Bordetella pertussis NOT DETECTED NOT DETECTED Final   Bordetella Parapertussis NOT DETECTED NOT DETECTED Final   Chlamydophila pneumoniae NOT DETECTED NOT DETECTED Final   Mycoplasma pneumoniae NOT DETECTED NOT DETECTED Final    Comment: Performed at [facility], [address] [place], [place] [address]  MRSA Next Gen by PCR, Nasal     Status: None   Collection  Time: 04/12/24 11:52 AM   Specimen: Nasal Mucosa; Nasal Swab  Result Value Ref Range Status   MRSA by PCR Next Gen NOT DETECTED NOT DETECTED Final    Comment: (NOTE) The GeneXpert MRSA Assay (FDA approved for NASAL specimens only), is one component of a comprehensive MRSA colonization surveillance program. It is not intended to diagnose MRSA infection nor to guide or monitor treatment for MRSA infections. Test performance is not FDA approved in patients less than 14 years old. Performed at [facility], [address], [place], [place] [address]     Labs: CBC: Recent Labs  Lab 04/11/24 1749 04/12/24 0457 04/14/24 0420 04/15/24 0424 04/16/24 0450 04/17/24 0439 04/18/24 0448  WBC 9.8   < > 7.8 6.3 5.6 5.1 4.7  NEUTROABS 8.0*  --   --   --   --   --   --   HGB 11.8*   < > 8.7* 9.0* 8.6* 8.2* 9.2*  HCT 36.7   < > 27.6* 28.5* 27.1* 26.0* 29.3*  MCV 97.3   < > 99.3 99.0 98.9 97.7 98.7  PLT 189   < > 165 172 179 178 223   < > = values in this interval not displayed.   Basic Metabolic Panel: Recent Labs  Lab 04/11/24 1749 04/12/24 0457  04/13/24 0424 04/14/24 0420 04/15/24 0424 04/16/24 0450 04/17/24 0439 04/18/24 0448  NA 136 138   < > 139 139 139 141 141  K 3.7 3.4*   < > 4.5 4.4 4.1 4.0 4.1  CL 96* 102   < > 105 104 103 105 105  CO2 25 25   < > 23 22 24 23 23   GLUCOSE 103* 87   < > 106* 90 98 90 81  BUN 21 20   < > 16 17 22 22 20   CREATININE 1.32* 1.29*   < > 1.29* 1.34* 1.46* 1.46* 1.29*  CALCIUM  8.7* 8.4*   < > 8.8* 9.0 8.8* 9.0 9.5  MG 2.1 2.1  --   --   --  2.4  --  2.5*  PHOS  --  4.4  --   --   --   --   --   --    < > = values in this interval not displayed.   Liver Function Tests: Recent Labs  Lab 04/11/24 1749 04/13/24 0424 04/14/24 0420 04/15/24 0424  AST 16 14* 15 15  ALT 8 7 7 8   ALKPHOS 73 67 67 70  BILITOT 0.8 0.2 0.3 0.4  PROT 7.5 6.3* 6.4* 6.6  ALBUMIN  3.5 3.4* 3.4* 3.5   CBG: Recent Labs  Lab 04/11/24 2003 04/17/24 0043  GLUCAP 106* 102*    Discharge time spent: greater than 30 minutes.  Signed: Alm Schneider, MD [facility] 04/18/2024

## 2024-04-20 DIAGNOSIS — M6281 Muscle weakness (generalized): Secondary | ICD-10-CM | POA: Diagnosis not present

## 2024-04-22 DIAGNOSIS — M6281 Muscle weakness (generalized): Secondary | ICD-10-CM | POA: Diagnosis not present

## 2024-04-25 DIAGNOSIS — M6281 Muscle weakness (generalized): Secondary | ICD-10-CM | POA: Diagnosis not present

## 2024-04-26 ENCOUNTER — Ambulatory Visit (INDEPENDENT_AMBULATORY_CARE_PROVIDER_SITE_OTHER): Admitting: Gastroenterology

## 2024-04-26 DIAGNOSIS — M6281 Muscle weakness (generalized): Secondary | ICD-10-CM | POA: Diagnosis not present

## 2024-04-27 ENCOUNTER — Other Ambulatory Visit: Payer: Self-pay

## 2024-04-27 ENCOUNTER — Emergency Department (HOSPITAL_COMMUNITY)
Admission: EM | Admit: 2024-04-27 | Discharge: 2024-04-28 | Disposition: A | Discharge status: Skilled Nursing Facility | Source: Skilled Nursing Facility | Attending: Emergency Medicine | Admitting: Emergency Medicine

## 2024-04-27 ENCOUNTER — Encounter (HOSPITAL_COMMUNITY): Payer: Self-pay

## 2024-04-27 ENCOUNTER — Emergency Department (HOSPITAL_COMMUNITY)

## 2024-04-27 DIAGNOSIS — I700 Atherosclerosis of aorta: Secondary | ICD-10-CM | POA: Diagnosis not present

## 2024-04-27 DIAGNOSIS — R0602 Shortness of breath: Secondary | ICD-10-CM | POA: Insufficient documentation

## 2024-04-27 DIAGNOSIS — M6281 Muscle weakness (generalized): Secondary | ICD-10-CM | POA: Diagnosis not present

## 2024-04-27 DIAGNOSIS — N189 Chronic kidney disease, unspecified: Secondary | ICD-10-CM | POA: Insufficient documentation

## 2024-04-27 DIAGNOSIS — I509 Heart failure, unspecified: Secondary | ICD-10-CM | POA: Insufficient documentation

## 2024-04-27 DIAGNOSIS — R059 Cough, unspecified: Secondary | ICD-10-CM | POA: Insufficient documentation

## 2024-04-27 DIAGNOSIS — Z79899 Other long term (current) drug therapy: Secondary | ICD-10-CM | POA: Diagnosis not present

## 2024-04-27 DIAGNOSIS — R140 Abdominal distension (gaseous): Secondary | ICD-10-CM | POA: Diagnosis not present

## 2024-04-27 DIAGNOSIS — K5900 Constipation, unspecified: Secondary | ICD-10-CM | POA: Diagnosis not present

## 2024-04-27 DIAGNOSIS — R519 Headache, unspecified: Secondary | ICD-10-CM | POA: Insufficient documentation

## 2024-04-27 DIAGNOSIS — Z7902 Long term (current) use of antithrombotics/antiplatelets: Secondary | ICD-10-CM | POA: Insufficient documentation

## 2024-04-27 DIAGNOSIS — Z7982 Long term (current) use of aspirin: Secondary | ICD-10-CM | POA: Insufficient documentation

## 2024-04-27 DIAGNOSIS — R5383 Other fatigue: Secondary | ICD-10-CM | POA: Diagnosis not present

## 2024-04-27 DIAGNOSIS — Z9981 Dependence on supplemental oxygen: Secondary | ICD-10-CM | POA: Insufficient documentation

## 2024-04-27 DIAGNOSIS — N281 Cyst of kidney, acquired: Secondary | ICD-10-CM | POA: Diagnosis not present

## 2024-04-27 DIAGNOSIS — I130 Hypertensive heart and chronic kidney disease with heart failure and stage 1 through stage 4 chronic kidney disease, or unspecified chronic kidney disease: Secondary | ICD-10-CM | POA: Insufficient documentation

## 2024-04-27 DIAGNOSIS — I2510 Atherosclerotic heart disease of native coronary artery without angina pectoris: Secondary | ICD-10-CM | POA: Diagnosis not present

## 2024-04-27 DIAGNOSIS — R109 Unspecified abdominal pain: Secondary | ICD-10-CM | POA: Diagnosis not present

## 2024-04-27 DIAGNOSIS — R058 Other specified cough: Secondary | ICD-10-CM | POA: Diagnosis not present

## 2024-04-27 DIAGNOSIS — R197 Diarrhea, unspecified: Secondary | ICD-10-CM | POA: Diagnosis not present

## 2024-04-27 DIAGNOSIS — R55 Syncope and collapse: Secondary | ICD-10-CM | POA: Diagnosis not present

## 2024-04-27 LAB — CBC WITH DIFFERENTIAL/PLATELET
Abs Immature Granulocytes: 0.02 K/uL (ref 0.00–0.07)
Basophils Absolute: 0 K/uL (ref 0.0–0.1)
Basophils Relative: 0 %
Eosinophils Absolute: 0.3 K/uL (ref 0.0–0.5)
Eosinophils Relative: 4 %
HCT: 32.2 % — ABNORMAL LOW (ref 36.0–46.0)
Hemoglobin: 10.3 g/dL — ABNORMAL LOW (ref 12.0–15.0)
Immature Granulocytes: 0 %
Lymphocytes Relative: 25 %
Lymphs Abs: 1.7 K/uL (ref 0.7–4.0)
MCH: 30.7 pg (ref 26.0–34.0)
MCHC: 32 g/dL (ref 30.0–36.0)
MCV: 96.1 fL (ref 80.0–100.0)
Monocytes Absolute: 0.7 K/uL (ref 0.1–1.0)
Monocytes Relative: 10 %
Neutro Abs: 4.2 K/uL (ref 1.7–7.7)
Neutrophils Relative %: 61 %
Platelets: 308 K/uL (ref 150–400)
RBC: 3.35 MIL/uL — ABNORMAL LOW (ref 3.87–5.11)
RDW: 13.9 % (ref 11.5–15.5)
WBC: 6.9 K/uL (ref 4.0–10.5)
nRBC: 0 % (ref 0.0–0.2)

## 2024-04-27 LAB — URINALYSIS, ROUTINE W REFLEX MICROSCOPIC
Bilirubin Urine: NEGATIVE
Glucose, UA: 50 mg/dL — AB
Hgb urine dipstick: NEGATIVE
Ketones, ur: NEGATIVE mg/dL
Leukocytes,Ua: NEGATIVE
Nitrite: NEGATIVE
Protein, ur: NEGATIVE mg/dL
Specific Gravity, Urine: 1.014 (ref 1.005–1.030)
pH: 5 (ref 5.0–8.0)

## 2024-04-27 LAB — BASIC METABOLIC PANEL WITH GFR
Anion gap: 11 (ref 5–15)
BUN: 24 mg/dL — ABNORMAL HIGH (ref 8–23)
CO2: 30 mmol/L (ref 22–32)
Calcium: 9 mg/dL (ref 8.9–10.3)
Chloride: 96 mmol/L — ABNORMAL LOW (ref 98–111)
Creatinine, Ser: 1.49 mg/dL — ABNORMAL HIGH (ref 0.44–1.00)
GFR, Estimated: 36 mL/min — ABNORMAL LOW (ref >60)
Glucose, Bld: 105 mg/dL — ABNORMAL HIGH (ref 70–99)
Potassium: 3.6 mmol/L (ref 3.5–5.1)
Sodium: 137 mmol/L (ref 135–145)

## 2024-04-27 LAB — HEPATIC FUNCTION PANEL
ALT: 11 U/L (ref 0–44)
AST: 23 U/L (ref 15–41)
Albumin: 4 g/dL (ref 3.5–5.0)
Alkaline Phosphatase: 89 U/L (ref 38–126)
Bilirubin, Direct: 0.2 mg/dL (ref 0.0–0.2)
Total Bilirubin: <0.2 mg/dL (ref 0.0–1.2)
Total Protein: 7.4 g/dL (ref 6.5–8.1)

## 2024-04-27 LAB — MAGNESIUM: Magnesium: 2.3 mg/dL (ref 1.7–2.4)

## 2024-04-27 LAB — TSH: TSH: 0.948 u[IU]/mL (ref 0.350–4.500)

## 2024-04-27 LAB — TROPONIN T, HIGH SENSITIVITY
Troponin T High Sensitivity: 19 ng/L (ref 0–19)
Troponin T High Sensitivity: 19 ng/L (ref 0–19)

## 2024-04-27 LAB — LIPASE, BLOOD: Lipase: 38 U/L (ref 11–51)

## 2024-04-27 LAB — PRO BRAIN NATRIURETIC PEPTIDE: Pro Brain Natriuretic Peptide: 775 pg/mL — ABNORMAL HIGH (ref <300.0)

## 2024-04-27 MED ORDER — OXYCODONE-ACETAMINOPHEN 5-325 MG PO TABS
1.0000 | ORAL_TABLET | Freq: Once | ORAL | Status: AC
  Administered 2024-04-27: 1 via ORAL
  Filled 2024-04-27: qty 1

## 2024-04-27 MED ORDER — MECLIZINE HCL 12.5 MG PO TABS
12.5000 mg | ORAL_TABLET | Freq: Once | ORAL | Status: AC
  Administered 2024-04-27: 12.5 mg via ORAL
  Filled 2024-04-27: qty 1

## 2024-04-27 MED ORDER — LACTATED RINGERS IV BOLUS
500.0000 mL | Freq: Once | INTRAVENOUS | Status: AC
  Administered 2024-04-27: 500 mL via INTRAVENOUS

## 2024-04-27 NOTE — ED Provider Notes (Incomplete)
 [place] [facility] AT [facility] Provider Note   CSN: [id number] Arrival date & time: 04/27/24  8063     Patient presents with: No chief complaint on file.   [name] is a 73 y.o. female.  {Add pertinent medical, surgical, social history, OB history to HPI:32947} HPI Patient presents for cough and shortness of breath.  Medical history includes HTN, HLD, CAD, GERD, fibromyalgia, CKD, CHF.  She was admitted to the hospital 2 weeks ago.  She was treated for pneumonia and fluid overload.  She was discharged on 2 L of supplemental oxygen.  Her midodrine  was increased to 10 mg 3 times daily.  She was discharged on continued antibiotics which she did complete.  She has continued on her 2 L of supplemental oxygen.  She reports that, since her recent hospital discharge, she has had ongoing cough and shortness of breath.  She does not feel that her pneumonia has improved.  For this reason, she presents to the ED.  Patient has had pain in chest and abdomen which she attributes to coughing.    Prior to Admission medications   Medication Sig Start Date End Date Taking? Authorizing Provider  acetaminophen  (TYLENOL ) 325 MG tablet Take 325 mg by mouth every 6 (six) hours as needed for mild pain (pain score 1-3). 11/11/23   [provider]  albuterol  (VENTOLIN  HFA) 108 (90 Base) MCG/ACT inhaler Inhale 1 puff into the lungs 4 (four) times daily as needed for wheezing or shortness of breath. 04/22/21   [provider]  ALPRAZolam  (XANAX ) 0.5 MG tablet Take 0.5 mg by mouth 2 (two) times daily. 03/30/24   [provider]  aspirin  81 MG EC tablet Take 81 mg by mouth daily.    [provider]  cefdinir (OMNICEF) 300 MG capsule Take 1 capsule (300 mg total) by mouth 2 (two) times daily. 04/18/24   Evonnie Lenis, MD  clopidogrel  (PLAVIX ) 75 MG tablet Take 1 tablet (75 mg total) by mouth daily. 12/25/22   Debera Jayson MATSU, MD  dapagliflozin  propanediol  (FARXIGA ) 10 MG TABS tablet Take 1 tablet (10 mg total) by mouth daily. 10/27/23   Arrien, Mauricio Daniel, MD  doxycycline (VIBRA-TABS) 100 MG tablet Take 1 tablet (100 mg total) by mouth every 12 (twelve) hours. 04/18/24   Evonnie Lenis, MD  EPINEPHrine 0.3 mg/0.3 mL IJ SOAJ injection Inject 0.3 mg into the muscle as needed for anaphylaxis. 07/13/23   [provider]  escitalopram (LEXAPRO) 10 MG tablet Take 10 mg by mouth daily. 03/18/24   [provider]  HYDROcodone  bit-homatropine (HYCODAN) 5-1.5 MG/5ML syrup Take 5 mLs by mouth 3 (three) times daily as needed for cough. 04/04/24   [provider]  hydrocortisone (ANUSOL-HC) 25 MG suppository Place 1 suppository (25 mg total) rectally 2 (two) times daily. 04/18/24   Evonnie Lenis, MD  levothyroxine  (SYNTHROID ) 25 MCG tablet Take 25 mcg by mouth daily before breakfast. 02/16/20   [provider]  melatonin 3 MG TABS tablet Take 3 mg by mouth at bedtime.    [provider]  methocarbamol (ROBAXIN) 500 MG tablet Take 1,000 mg by mouth in the morning and at bedtime. 11/23/23   [provider]  methocarbamol (ROBAXIN) 500 MG tablet Take 500 mg by mouth every 6 (six) hours as needed (pain).  05/07/24  [provider]  midodrine  (PROAMATINE ) 10 MG tablet Take 1 tablet (10 mg total) by mouth 3 (three) times daily with meals. 04/18/24  Evonnie Lenis, MD  Multiple Vitamins-Minerals (PRESERVISION AREDS 2) CAPS Take 2 capsules by mouth daily.    [provider]  nitroGLYCERIN  (NITROSTAT ) 0.4 MG SL tablet Place 1 tablet (0.4 mg total) under the tongue every 5 (five) minutes x 3 doses as needed for chest pain (if no relief after 2nd dose, proceed to ED or call 911). 06/08/23   Debera Jayson MATSU, MD  ondansetron  (ZOFRAN ) 4 MG tablet Take 4 mg by mouth 3 (three) times daily as needed for nausea or vomiting. 06/03/21   [provider]  Allie Gutta Calcium  500 MG TABS Take 1 tablet by mouth 2 (two)  times daily. 09/22/23   [provider]  pantoprazole  (PROTONIX ) 40 MG tablet Take 40 mg by mouth daily. 10/08/22   [provider]  rosuvastatin  (CRESTOR ) 40 MG tablet Take 1 tablet (40 mg total) by mouth daily. 10/26/23   Arrien, Elidia Sieving, MD  torsemide  (DEMADEX ) 20 MG tablet Take 1 tablet (20 mg total) by mouth daily. 11/28/23   Evonnie Lenis, MD  traZODone  (DESYREL ) 100 MG tablet Take 100 mg by mouth at bedtime. 03/24/24   [provider]  Vitamin D , Ergocalciferol , (DRISDOL ) 50000 UNITS CAPS Take 50,000 Units by mouth every Monday.     [provider]    Allergies: Citalopram, Ciprofloxacin hcl, Dicyclomine , Lamotrigine, Metoclopramide, Milnacipran hcl, Omeprazole, Pecan nut (diagnostic), Pregabalin, Serotonin reuptake inhibitors (ssris), Simvastatin , Tape, Tizanidine, Trazodone  and nefazodone, Venlafaxine, Celecoxib, Codeine, Nefazodone, and Sertraline  hcl    Review of Systems  Constitutional:  Positive for activity change and fatigue.  Respiratory:  Positive for cough and shortness of breath.   Cardiovascular:  Positive for chest pain.  Gastrointestinal:  Positive for abdominal pain.  All other systems reviewed and are negative.   Updated Vital Signs BP (!) 114/92   Pulse 67   Temp (!) 97.4 F (36.3 C)   Resp 18   Ht 4' 11 (1.499 m)   Wt 68.4 kg   SpO2 98%   BMI 30.46 kg/m   Physical Exam Vitals and nursing note reviewed.  Constitutional:      General: She is not in acute distress.    Appearance: Normal appearance. She is well-developed. She is not ill-appearing, toxic-appearing or diaphoretic.  HENT:     Head: Normocephalic and atraumatic.     Right Ear: External ear normal.     Left Ear: External ear normal.     Nose: Nose normal.     Mouth/Throat:     Mouth: Mucous membranes are moist.  Eyes:     Extraocular Movements: Extraocular movements intact.     Conjunctiva/sclera: Conjunctivae normal.  Cardiovascular:     Rate and  Rhythm: Normal rate and regular rhythm.     Heart sounds: No murmur heard. Pulmonary:     Effort: Pulmonary effort is normal. No respiratory distress.     Breath sounds: Decreased breath sounds and rhonchi present. No wheezing.  Abdominal:     General: There is no distension.     Palpations: Abdomen is soft.     Tenderness: There is abdominal tenderness.  Musculoskeletal:        General: No swelling. Normal range of motion.     Cervical back: Normal range of motion and neck supple.  Skin:    General: Skin is warm and dry.     Coloration: Skin is not jaundiced or pale.  Neurological:     General: No focal deficit present.     Mental  Status: She is alert and oriented to person, place, and time.  Psychiatric:        Mood and Affect: Mood normal.        Behavior: Behavior normal.     (all labs ordered are listed, but only abnormal results are displayed) Labs Reviewed  LIPASE, BLOOD  CBC WITH DIFFERENTIAL/PLATELET  URINALYSIS, ROUTINE W REFLEX MICROSCOPIC  BASIC METABOLIC PANEL WITH GFR  HEPATIC FUNCTION PANEL  MAGNESIUM     EKG: None  Radiology: DG Chest Portable 1 View Result Date: 04/27/2024 CLINICAL DATA:  Shortness of breath, productive cough EXAM: PORTABLE CHEST 1 VIEW COMPARISON:  11/24/2023 FINDINGS: Heart and mediastinal contours within normal limits. Diffuse interstitial prominence throughout the lungs. No effusions. No acute bony abnormality. IMPRESSION: Diffuse interstitial prominence could reflect interstitial edema or atypical infection. Electronically Signed   By: Franky Crease M.D.   On: 04/27/2024 20:07    {Document cardiac monitor, telemetry assessment procedure when appropriate:32947} Procedures   Medications Ordered in the ED  lactated ringers  bolus 500 mL (has no administration in time range)  meclizine (ANTIVERT) tablet 12.5 mg (has no administration in time range)      {Click here for ABCD2, HEART and other calculators REFRESH Note before signing:1}                               Medical Decision Making Amount and/or Complexity of Data Reviewed Labs: ordered. Radiology: ordered.  Risk Prescription drug management.   This patient presents to the ED for concern of ***, this involves an extensive number of treatment options, and is a complaint that carries with it a high risk of complications and morbidity.  The differential diagnosis includes ***   Co morbidities / Chronic conditions that complicate the patient evaluation  ***   Additional history obtained:  Additional history obtained from EMR External records from outside source obtained and reviewed including ***   Lab Tests:  I Ordered, and personally interpreted labs.  The pertinent results include:  ***   Imaging Studies ordered:  I ordered imaging studies including ***  I independently visualized and interpreted imaging which showed *** I agree with the radiologist interpretation   Cardiac Monitoring: / EKG:  The patient was maintained on a cardiac monitor.  I personally viewed and interpreted the cardiac monitored which showed an underlying rhythm of: ***   Problem List / ED Course / Critical interventions / Medication management  Patient presenting for ongoing cough and shortness of breath since her recent hospital discharge.  She did complete antibiotics for treatment of pneumonia.  She does not feel that her symptoms have improved.  She has had chest pain and abdominal pain which she attributes to frequent coughing.  Cough has been productive of white and yellow sputum.  On arrival in the ED, patient is well-appearing.  Her current breathing is unlabored.  She is able to speak in complete sentences.  On lung auscultation, she does have some rhonchi and diminished breath sounds.  Percocet was ordered for analgesia.  Workup was initiated.*** I ordered medication including ***   Reevaluation of the patient after these medicines showed that the patient *** I  have reviewed the patients home medicines and have made adjustments as needed   Consultations Obtained:  I requested consultation with the ***,  and discussed lab and imaging findings as well as pertinent plan - they recommend: ***   Social Determinants of Health:  ***  Test / Admission - Considered:  ***   {Document critical care time when appropriate  Document review of labs and clinical decision tools ie CHADS2VASC2, etc  Document your independent review of radiology images and any outside records  Document your discussion with family members, caretakers and with consultants  Document social determinants of health affecting pt's care  Document your decision making why or why not admission, treatments were needed:32947:::1}   Final diagnoses:  None    ED Discharge Orders     None

## 2024-04-27 NOTE — ED Provider Notes (Signed)
 [place] EMERGENCY DEPARTMENT AT [facility] Provider Note   CSN: [id number] Arrival date & time: 04/27/24  8063     Patient presents with: No chief complaint on file.   [name] is a 73 y.o. female.  {Add pertinent medical, surgical, social history, OB history to HPI:32947} HPI Patient presents for cough and shortness of breath.  Medical history includes HTN, HLD, CAD, GERD, fibromyalgia, CKD, CHF.  She was admitted to the hospital 2 weeks ago.  She was treated for pneumonia and fluid overload.  She was discharged on 2 L of supplemental oxygen.  Her midodrine  was increased to 10 mg 3 times daily.  She was discharged on continued antibiotics which she did complete.  She has continued on her 2 L of supplemental oxygen.  She reports that, since her recent hospital discharge, she has had ongoing cough and shortness of breath.  She does not feel that her pneumonia has improved.  For this reason, she presents to the ED.  Patient has had pain in chest and abdomen which she attributes to coughing.    Prior to Admission medications   Medication Sig Start Date End Date Taking? Authorizing Provider  acetaminophen  (TYLENOL ) 325 MG tablet Take 325 mg by mouth every 6 (six) hours as needed for mild pain (pain score 1-3). 11/11/23   [provider]  albuterol  (VENTOLIN  HFA) 108 (90 Base) MCG/ACT inhaler Inhale 1 puff into the lungs 4 (four) times daily as needed for wheezing or shortness of breath. 04/22/21   [provider]  ALPRAZolam  (XANAX ) 0.5 MG tablet Take 0.5 mg by mouth 2 (two) times daily. 03/30/24   [provider]  aspirin  81 MG EC tablet Take 81 mg by mouth daily.    [provider]  cefdinir (OMNICEF) 300 MG capsule Take 1 capsule (300 mg total) by mouth 2 (two) times daily. 04/18/24   Evonnie Lenis, MD  clopidogrel  (PLAVIX ) 75 MG tablet Take 1 tablet (75 mg total) by mouth daily. 12/25/22   Debera Jayson MATSU, MD  dapagliflozin  propanediol  (FARXIGA ) 10 MG TABS tablet Take 1 tablet (10 mg total) by mouth daily. 10/27/23   Arrien, Mauricio Daniel, MD  doxycycline (VIBRA-TABS) 100 MG tablet Take 1 tablet (100 mg total) by mouth every 12 (twelve) hours. 04/18/24   Evonnie Lenis, MD  EPINEPHrine 0.3 mg/0.3 mL IJ SOAJ injection Inject 0.3 mg into the muscle as needed for anaphylaxis. 07/13/23   [provider]  escitalopram (LEXAPRO) 10 MG tablet Take 10 mg by mouth daily. 03/18/24   [provider]  HYDROcodone  bit-homatropine (HYCODAN) 5-1.5 MG/5ML syrup Take 5 mLs by mouth 3 (three) times daily as needed for cough. 04/04/24   [provider]  hydrocortisone (ANUSOL-HC) 25 MG suppository Place 1 suppository (25 mg total) rectally 2 (two) times daily. 04/18/24   Evonnie Lenis, MD  levothyroxine  (SYNTHROID ) 25 MCG tablet Take 25 mcg by mouth daily before breakfast. 02/16/20   [provider]  melatonin 3 MG TABS tablet Take 3 mg by mouth at bedtime.    [provider]  methocarbamol (ROBAXIN) 500 MG tablet Take 1,000 mg by mouth in the morning and at bedtime. 11/23/23   [provider]  methocarbamol (ROBAXIN) 500 MG tablet Take 500 mg by mouth every 6 (six) hours as needed (pain).  05/07/24  [provider]  midodrine  (PROAMATINE ) 10 MG tablet Take 1 tablet (10 mg total) by mouth 3 (three) times daily with meals. 04/18/24  Evonnie Lenis, MD  Multiple Vitamins-Minerals (PRESERVISION AREDS 2) CAPS Take 2 capsules by mouth daily.    [provider]  nitroGLYCERIN  (NITROSTAT ) 0.4 MG SL tablet Place 1 tablet (0.4 mg total) under the tongue every 5 (five) minutes x 3 doses as needed for chest pain (if no relief after 2nd dose, proceed to ED or call 911). 06/08/23   Debera Jayson MATSU, MD  ondansetron  (ZOFRAN ) 4 MG tablet Take 4 mg by mouth 3 (three) times daily as needed for nausea or vomiting. 06/03/21   [provider]  Allie Gutta Calcium  500 MG TABS Take 1 tablet by mouth 2 (two)  times daily. 09/22/23   [provider]  pantoprazole  (PROTONIX ) 40 MG tablet Take 40 mg by mouth daily. 10/08/22   [provider]  rosuvastatin  (CRESTOR ) 40 MG tablet Take 1 tablet (40 mg total) by mouth daily. 10/26/23   Arrien, Mauricio Daniel, MD  torsemide  (DEMADEX ) 20 MG tablet Take 1 tablet (20 mg total) by mouth daily. 11/28/23   Evonnie Lenis, MD  traZODone  (DESYREL ) 100 MG tablet Take 100 mg by mouth at bedtime. 03/24/24   [provider]  Vitamin D , Ergocalciferol , (DRISDOL ) 50000 UNITS CAPS Take 50,000 Units by mouth every Monday.     [provider]    Allergies: Citalopram, Ciprofloxacin hcl, Dicyclomine , Lamotrigine, Metoclopramide, Milnacipran hcl, Omeprazole, Pecan nut (diagnostic), Pregabalin, Serotonin reuptake inhibitors (ssris), Simvastatin , Tape, Tizanidine, Trazodone  and nefazodone, Venlafaxine, Celecoxib, Codeine, Nefazodone, and Sertraline  hcl    Review of Systems  Constitutional:  Positive for activity change and fatigue.  Respiratory:  Positive for cough and shortness of breath.   Cardiovascular:  Positive for chest pain.  Gastrointestinal:  Positive for abdominal pain.  All other systems reviewed and are negative.   Updated Vital Signs BP (!) 114/92   Pulse 67   Temp (!) 97.4 F (36.3 C)   Resp 18   Ht 4' 11 (1.499 m)   Wt 68.4 kg   SpO2 98%   BMI 30.46 kg/m   Physical Exam Vitals and nursing note reviewed.  Constitutional:      General: She is not in acute distress.    Appearance: Normal appearance. She is well-developed. She is not ill-appearing, toxic-appearing or diaphoretic.  HENT:     Head: Normocephalic and atraumatic.     Right Ear: External ear normal.     Left Ear: External ear normal.     Nose: Nose normal.     Mouth/Throat:     Mouth: Mucous membranes are moist.  Eyes:     Extraocular Movements: Extraocular movements intact.     Conjunctiva/sclera: Conjunctivae normal.  Cardiovascular:     Rate and  Rhythm: Normal rate and regular rhythm.     Heart sounds: No murmur heard. Pulmonary:     Effort: Pulmonary effort is normal. No respiratory distress.     Breath sounds: Decreased breath sounds and rhonchi present. No wheezing.  Abdominal:     General: There is no distension.     Palpations: Abdomen is soft.     Tenderness: There is abdominal tenderness.  Musculoskeletal:        General: No swelling. Normal range of motion.     Cervical back: Normal range of motion and neck supple.  Skin:    General: Skin is warm and dry.     Coloration: Skin is not jaundiced or pale.  Neurological:     General: No focal deficit present.     Mental  Status: She is alert and oriented to person, place, and time.  Psychiatric:        Mood and Affect: Mood normal.        Behavior: Behavior normal.     (all labs ordered are listed, but only abnormal results are displayed) Labs Reviewed  LIPASE, BLOOD  CBC WITH DIFFERENTIAL/PLATELET  URINALYSIS, ROUTINE W REFLEX MICROSCOPIC  BASIC METABOLIC PANEL WITH GFR  HEPATIC FUNCTION PANEL  MAGNESIUM     EKG: None  Radiology: DG Chest Portable 1 View Result Date: 04/27/2024 CLINICAL DATA:  Shortness of breath, productive cough EXAM: PORTABLE CHEST 1 VIEW COMPARISON:  11/24/2023 FINDINGS: Heart and mediastinal contours within normal limits. Diffuse interstitial prominence throughout the lungs. No effusions. No acute bony abnormality. IMPRESSION: Diffuse interstitial prominence could reflect interstitial edema or atypical infection. Electronically Signed   By: Franky Crease M.D.   On: 04/27/2024 20:07    {Document cardiac monitor, telemetry assessment procedure when appropriate:32947} Procedures   Medications Ordered in the ED  lactated ringers  bolus 500 mL (has no administration in time range)  meclizine (ANTIVERT) tablet 12.5 mg (has no administration in time range)      {Click here for ABCD2, HEART and other calculators REFRESH Note before signing:1}                               Medical Decision Making Amount and/or Complexity of Data Reviewed Labs: ordered. Radiology: ordered.   This patient presents to the ED for concern of ***, this involves an extensive number of treatment options, and is a complaint that carries with it a high risk of complications and morbidity.  The differential diagnosis includes ***   Co morbidities / Chronic conditions that complicate the patient evaluation  ***   Additional history obtained:  Additional history obtained from EMR External records from outside source obtained and reviewed including ***   Lab Tests:  I Ordered, and personally interpreted labs.  The pertinent results include:  ***   Imaging Studies ordered:  I ordered imaging studies including ***  I independently visualized and interpreted imaging which showed *** I agree with the radiologist interpretation   Cardiac Monitoring: / EKG:  The patient was maintained on a cardiac monitor.  I personally viewed and interpreted the cardiac monitored which showed an underlying rhythm of: ***   Problem List / ED Course / Critical interventions / Medication management  Patient presenting for ongoing cough and shortness of breath since her recent hospital discharge.  She did complete antibiotics for treatment of pneumonia.  She does not feel that her symptoms have improved.  She has had chest pain and abdominal pain which she attributes to frequent coughing.  Cough has been productive of white and yellow sputum.  On arrival in the ED, patient is well-appearing.  Her current breathing is unlabored.  She is able to speak in complete sentences.  On lung auscultation, she does have some rhonchi and diminished breath sounds.  Percocet was ordered for analgesia.  Workup was initiated.*** I ordered medication including ***   Reevaluation of the patient after these medicines showed that the patient *** I have reviewed the patients home medicines  and have made adjustments as needed   Consultations Obtained:  I requested consultation with the ***,  and discussed lab and imaging findings as well as pertinent plan - they recommend: ***   Social Determinants of Health:  ***   Test /  Admission - Considered:  ***   {Document critical care time when appropriate  Document review of labs and clinical decision tools ie CHADS2VASC2, etc  Document your independent review of radiology images and any outside records  Document your discussion with family members, caretakers and with consultants  Document social determinants of health affecting pt's care  Document your decision making why or why not admission, treatments were needed:32947:::1}   Final diagnoses:  None    ED Discharge Orders     None

## 2024-04-27 NOTE — ED Triage Notes (Signed)
 [facility] from [facility]  Was here a couple weeks ago and dx with pneumonia.  Checking in tonight for multiple issues Abdomen distention- last BM 1 hour pta.  Feels dizzy. But able to ambulate to bed. CBG 111 She was placed on 2l when d/c she was 99%  2 puffs of inhaler PTA.  Pain when coughing from pneumonia. Did not get anything from the facility for the cough or the pain.

## 2024-04-28 DIAGNOSIS — R140 Abdominal distension (gaseous): Secondary | ICD-10-CM | POA: Diagnosis not present

## 2024-04-28 DIAGNOSIS — R109 Unspecified abdominal pain: Secondary | ICD-10-CM | POA: Diagnosis not present

## 2024-04-28 DIAGNOSIS — N281 Cyst of kidney, acquired: Secondary | ICD-10-CM | POA: Diagnosis not present

## 2024-04-28 DIAGNOSIS — M6281 Muscle weakness (generalized): Secondary | ICD-10-CM | POA: Diagnosis not present

## 2024-04-28 DIAGNOSIS — R55 Syncope and collapse: Secondary | ICD-10-CM | POA: Diagnosis not present

## 2024-04-28 DIAGNOSIS — I700 Atherosclerosis of aorta: Secondary | ICD-10-CM | POA: Diagnosis not present

## 2024-04-28 DIAGNOSIS — R059 Cough, unspecified: Secondary | ICD-10-CM | POA: Diagnosis not present

## 2024-04-28 MED ORDER — DEXTROMETHORPHAN-GUAIFENESIN 5-100 MG/5ML PO LIQD: 10.0000 mL | Freq: Four times a day (QID) | ORAL | 0 refills | Status: DC | PRN

## 2024-04-28 MED ORDER — ACETAMINOPHEN 325 MG PO TABS
650.0000 mg | ORAL_TABLET | Freq: Once | ORAL | Status: AC
  Administered 2024-04-28: 650 mg via ORAL
  Filled 2024-04-28: qty 2

## 2024-04-28 MED ORDER — IOHEXOL 350 MG/ML SOLN
75.0000 mL | Freq: Once | INTRAVENOUS | Status: AC | PRN
  Administered 2024-04-28: 75 mL via INTRAVENOUS

## 2024-04-28 MED ORDER — POLYETHYLENE GLYCOL 3350 17 GM/SCOOP PO POWD: 17.0000 g | Freq: Every day | ORAL | 0 refills | Status: AC

## 2024-04-28 NOTE — ED Notes (Signed)
 Patient transported to CT

## 2024-04-28 NOTE — ED Provider Notes (Signed)
 Care assumed at shift change. Here for multiple complaints after recent admission for PNA.  Physical Exam  BP (!) 98/37   Pulse 64   Temp 98 F (36.7 C) (Oral)   Resp 10   Ht 4' 11 (1.499 m)   Wt 68.4 kg   SpO2 97%   BMI 30.46 kg/m   Physical Exam  Procedures  Procedures  ED Course / MDM   Clinical Course as of 04/28/24 0151  Thu Apr 28, 2024  0149 I personally viewed the images from radiology studies and agree with radiologist interpretation: CT neg for acute process, pneumonia no longer present. Abdomen with constipation. Discussed these results with patient. She is mostly concerned about controlling her cough and headache. States she was getting Mucinex  while in the hospital but only getting codeine syrup at [facility]. Will also add Miralax for constipation. Otherwise no indication for re-admission and she is comfortable returning to facility. PCP follow up, RTED for any other concerns.   [CS]    Clinical Course User Index [CS] Roselyn Carlin NOVAK, MD   Medical Decision Making Amount and/or Complexity of Data Reviewed Labs: ordered. Radiology: ordered.  Risk OTC drugs. Prescription drug management.          Roselyn Carlin NOVAK, MD 04/28/24 [phone]

## 2024-04-28 NOTE — ED Notes (Signed)
 [facility] called for transport back to [facility]

## 2024-04-28 NOTE — ED Notes (Signed)
Pt ambulated with assistance to bedside commode

## 2024-05-02 DIAGNOSIS — M6281 Muscle weakness (generalized): Secondary | ICD-10-CM | POA: Diagnosis not present

## 2024-05-03 DIAGNOSIS — M6281 Muscle weakness (generalized): Secondary | ICD-10-CM | POA: Diagnosis not present

## 2024-05-03 DIAGNOSIS — R1319 Other dysphagia: Secondary | ICD-10-CM

## 2024-05-03 DIAGNOSIS — K625 Hemorrhage of anus and rectum: Secondary | ICD-10-CM

## 2024-05-04 DIAGNOSIS — M6281 Muscle weakness (generalized): Secondary | ICD-10-CM | POA: Diagnosis not present

## 2024-05-05 DIAGNOSIS — M6281 Muscle weakness (generalized): Secondary | ICD-10-CM | POA: Diagnosis not present

## 2024-05-09 DIAGNOSIS — M6281 Muscle weakness (generalized): Secondary | ICD-10-CM | POA: Diagnosis not present

## 2024-05-10 ENCOUNTER — Ambulatory Visit (INDEPENDENT_AMBULATORY_CARE_PROVIDER_SITE_OTHER): Admitting: Gastroenterology

## 2024-05-10 ENCOUNTER — Telehealth (INDEPENDENT_AMBULATORY_CARE_PROVIDER_SITE_OTHER): Payer: Self-pay

## 2024-05-10 ENCOUNTER — Encounter (INDEPENDENT_AMBULATORY_CARE_PROVIDER_SITE_OTHER): Payer: Self-pay | Admitting: Gastroenterology

## 2024-05-10 VITALS — BP 117/51 | HR 65 | Temp 97.8°F | Ht 59.0 in | Wt 146.8 lb

## 2024-05-10 DIAGNOSIS — K625 Hemorrhage of anus and rectum: Secondary | ICD-10-CM | POA: Diagnosis not present

## 2024-05-10 DIAGNOSIS — D509 Iron deficiency anemia, unspecified: Secondary | ICD-10-CM | POA: Diagnosis not present

## 2024-05-10 DIAGNOSIS — R110 Nausea: Secondary | ICD-10-CM | POA: Diagnosis not present

## 2024-05-10 DIAGNOSIS — M6281 Muscle weakness (generalized): Secondary | ICD-10-CM | POA: Diagnosis not present

## 2024-05-10 MED ORDER — FERROUS SULFATE 325 (65 FE) MG PO TABS
325.0000 mg | ORAL_TABLET | Freq: Every day | ORAL | 1 refills | Status: AC
Start: 2024-05-10 — End: ?

## 2024-05-10 MED ORDER — PEG 3350-KCL-NA BICARB-NACL 420 G PO SOLR: 4000.0000 mL | Freq: Once | ORAL | 0 refills | Status: AC

## 2024-05-10 NOTE — Telephone Encounter (Signed)
    05/10/24  [name] 31-Aug-1950  What type of surgery is being performed? Colonoscopy and upper endoscopy  When is surgery scheduled? 06/08/2024 Wednesday  What type of clearance is required (medical or pharmacy to hold medication or both? medication  Are there any medications that need to be held prior to surgery and how long? Plavix , hold 5 days prior   Name of physician performing surgery?  Dr. Eartha Rouse [facility] Phone: [phone] Fax: [phone]  Anethesia type (none, local, MAC, general)? MAC

## 2024-05-10 NOTE — Telephone Encounter (Addendum)
 Spoke with patient and assisted living personnel here in the office, scheduled TCS/EGD for 06/08/2024 at 10:15am. Rx sent to pharmacy. Instructions given in office. Clearance sent to cardiologist Debera Jayson MATSU, MD.

## 2024-05-10 NOTE — Progress Notes (Addendum)
 Referring Provider: [name], MD Primary Care Physician:  [name], MD Primary GI Physician: Dr. Eartha   Chief Complaint  Patient presents with   Follow-up    Patient here today for a follow up. Patient says she is in need of an updated colonscopy. She has a history of polyps, and patient has some nausea at times,but Patient denies any current gi related issues.    HPI:   LERA GAINES is a 73 y.o. female with past medical history of f CAD, carotid artery disease with severe RICA stenosis s/p R-CEA (12/'24) anxiety, arthritis, CAD, depression, HTN, fibromyalgia, GERD, HLD, NSTEMI, renal artery stenosis, RLS, spondylosis   Patient presenting today for:  Follow up of rectal bleeding, IDA and nausea   Patient presented to the ED earlier this month with CAP, reports of rectal bleeding and heme positive stool. She reported constipation, CT with large stool burden, improved with senna and miralax. Recommended outpatient colonoscopy for further evaluation. Hgb 11.8 on admission, declined to 8.2.  Last hgb 10.3 on 10/15 Iron on 10/3 was low at 22, with TIBC 242 Sat 9, ferritin 226  Present:  She reports that rectal bleeding has since resolved. She was seeing blood when wiping, unsure how long this lasted. She notes constipation and harder stool balls at the time of bleeding. She denies any abdominal pain. Bowels are moving better now that she is on stool softeners. She notes she had some nausea and vomiting when taking some vitamins for my eyes she has had some improvement in nausea since she stopped taking them though still has some at times. She denies any  history of iron deficiency anemia in the past that she is aware of. Denies any current melena, but reports she had some prior to her recent admission, though cannot pinpoint exactly when. She does not take any NSAIDs. Had some mild weight loss since her admission, she notes some early satiety at times as well.   She is taking  protonix  40mg  daily, has some breakthrough at times, she thinks it depends on what she eats. Endorses some occasional dysphagia as well.   She does take plavix  and 81mg  ASA daily. She is currently on 2L supplemental O2.    Colonoscopy:05/08/17 - Preparation of the colon was poor. - Two small polyps at the hepatic flexure and in the ascending colon. Tubular adenoma - One 4 mm polyp in the ascending colon, tubular adenoma Last Endoscopy:01/31/14 Erosive reflux esophagitis. Nonerosive antral gastritis. Biopsy taken for routine histology-neg h pylori   Recommendations:  Surveillance colonoscopy recommended in oct 2019  [facility]   05/10/24 0911  Weight: 146 lb 12.8 oz (66.6 kg)     Past Medical History:  Diagnosis Date   Abnormal chest CT    Anxiety    Arthritis    Rhumatoid and Osteoarthritis   Carotid artery disease    Dr. Serene, s/p left CEA, severe R ICA stenosis   Coronary atherosclerosis of native coronary artery    DES RCA/circumflex 4/10, LVEF 65 -> repeat cath 11/2021 with multivessel CAD including LM stenosis, initial med rx   Depression    Essential hypertension    Fibromyalgia    GERD (gastroesophageal reflux disease)    Hiatal hernia    Hyperlipidemia    Kidney cysts    Macular degeneration    NSTEMI (non-ST elevated myocardial infarction) (HCC)    4/10   Renal artery stenosis    Left renal artery stent 12/13  Restless leg syndrome    Sleep apnea    Spondylosis without myelopathy    Tobacco abuse     Past Surgical History:  Procedure Laterality Date   ABDOMINAL HYSTERECTOMY  07/15/1975   BREAST LUMPECTOMY     CAROTID ENDARTERECTOMY Left 02/28/2009   CHOLECYSTECTOMY  07/14/1989   COLONOSCOPY     COLONOSCOPY N/A 05/08/2017   Procedure: COLONOSCOPY;  Surgeon: Golda Claudis PENNER, MD;  [facility];  Service: Endoscopy;  Laterality: N/A;  12:00   CORONARY ATHERECTOMY N/A 11/19/2022   Procedure: CORONARY ATHERECTOMY;  Surgeon: Darron Deatrice LABOR, MD;  [facility]: [facility];  Service: Cardiovascular;  Laterality: N/A;   CORONARY STENT INTERVENTION N/A 11/19/2022   Procedure: CORONARY STENT INTERVENTION;  Surgeon: Darron Deatrice LABOR, MD;  [facility]: [facility];  Service: Cardiovascular;  Laterality: N/A;   CORONARY ULTRASOUND/IVUS N/A 11/19/2022   Procedure: Coronary Ultrasound/IVUS;  Surgeon: Darron Deatrice LABOR, MD;  [facility];  Service: Cardiovascular;  Laterality: N/A;   ENDARTERECTOMY Right 07/03/2023   Procedure: ENDARTERECTOMY CAROTID WITH XENOSURE BIOLOGIC PATCH ANGIOPLASTY;  Surgeon: Serene Gaile ORN, MD;  Location: [facility] OR;  Service: Vascular;  Laterality: Right;   ESOPHAGOGASTRODUODENOSCOPY N/A 04/07/2013   Procedure: ESOPHAGOGASTRODUODENOSCOPY (EGD);  Surgeon: Claudis PENNER Golda, MD;  [facility];  Service: Endoscopy;  Laterality: N/A;  250   ESOPHAGOGASTRODUODENOSCOPY N/A 02/02/2014   Procedure: ESOPHAGOGASTRODUODENOSCOPY (EGD);  Surgeon: Claudis PENNER Golda, MD;  [facility];  Service: Endoscopy;  Laterality: N/A;  120   EYE SURGERY     Catartact bilateral   INSERTION OF RETROGRADE CAROTID STENT Right 07/03/2023   Procedure: Right common carotid angiogram;  Surgeon: Serene Gaile ORN, MD;  Location: [facility] OR;  Service: Vascular;  Laterality: Right;   JOINT REPLACEMENT  09/12/2011   Left knee   JOINT REPLACEMENT  07/14/2004   Left Knee   kidney stent     Left December 2013   LEFT HEART CATH AND CORONARY ANGIOGRAPHY N/A 11/13/2021   Procedure: LEFT HEART CATH AND CORONARY ANGIOGRAPHY;  Surgeon: Darron Deatrice LABOR, MD;  [facility];  Service: Cardiovascular;  Laterality: N/A;   LEFT HEART CATH AND CORONARY ANGIOGRAPHY N/A 11/18/2022   Procedure: LEFT HEART CATH AND CORONARY ANGIOGRAPHY;  Surgeon: Anner Alm ORN, MD;  Location: [facility] INVASIVE CV LAB;  Service: Cardiovascular;  Laterality: N/A;   LUMBAR DISC SURGERY     L5-S1   MULTIPLE TOOTH EXTRACTIONS  07/14/2013    Neck Fusion  07/14/1994   C4 to C6   PERCUTANEOUS STENT INTERVENTION Left 06/15/2012   Procedure: PERCUTANEOUS STENT INTERVENTION;  Surgeon: Gaile ORN Serene, MD;  Location: [facility] CATH LAB;  Service: Cardiovascular;  Laterality: Left;  renal   PERIPHERAL VASCULAR BALLOON ANGIOPLASTY  05/26/2017   Procedure: PERIPHERAL VASCULAR BALLOON ANGIOPLASTY;  Surgeon: Serene Gaile ORN, MD;  [facility];  Service: Cardiovascular;;  Lt. Renal Angioplasty   POLYPECTOMY  05/08/2017   Procedure: POLYPECTOMY;  Surgeon: Golda Claudis PENNER, MD;  [facility];  Service: Endoscopy;;  colon    RENAL ANGIOGRAM N/A 06/15/2012   Procedure: RENAL ANGIOGRAM;  Surgeon: Gaile ORN Serene, MD;  Location: [facility] CATH LAB;  Service: Cardiovascular;  Laterality: N/A;   RENAL ANGIOGRAPHY  05/26/2017   Procedure: RENAL ANGIOGRAPHY;  Surgeon: Serene Gaile ORN, MD;  [facility];  Service: Cardiovascular;;   SPINE SURGERY  07/14/1992   Ruptured disc   TEMPORARY  PACEMAKER N/A 11/19/2022   Procedure: TEMPORARY PACEMAKER;  Surgeon: Darron Deatrice LABOR, MD;  [facility];  Service: Cardiovascular;  Laterality: N/A;   TONSILECTOMY, ADENOIDECTOMY, BILATERAL MYRINGOTOMY AND TUBES  07/15/1967   TOTAL KNEE ARTHROPLASTY     Left   TUBAL LIGATION      Current Outpatient Medications  Medication Sig Dispense Refill   cefdinir (OMNICEF) 300 MG capsule Take 1 capsule (300 mg total) by mouth 2 (two) times daily. 6 capsule 0   cyanocobalamin  1000 MCG tablet Take 1,000 mcg by mouth daily.     doxycycline (VIBRA-TABS) 100 MG tablet Take 1 tablet (100 mg total) by mouth every 12 (twelve) hours. 6 tablet 0   hydrocortisone (ANUSOL-HC) 25 MG suppository Place 1 suppository (25 mg total) rectally 2 (two) times daily. 12 suppository 0   methocarbamol (ROBAXIN) 500 MG tablet Take 1,000 mg by mouth in the morning and at bedtime.     midodrine  (PROAMATINE ) 10 MG tablet Take 1 tablet (10 mg total) by  mouth 3 (three) times daily with meals. 90 tablet 1   Multiple Vitamins-Minerals (PRESERVISION AREDS 2) CAPS Take 2 capsules by mouth daily.     Oyster Shell Calcium  500 MG TABS Take 1 tablet by mouth 2 (two) times daily.     pantoprazole  (PROTONIX ) 40 MG tablet Take 40 mg by mouth daily.     rosuvastatin  (CRESTOR ) 40 MG tablet Take 1 tablet (40 mg total) by mouth daily. 30 tablet 0   traZODone  (DESYREL ) 100 MG tablet Take 100 mg by mouth at bedtime.     Vitamin D , Ergocalciferol , (DRISDOL ) 50000 UNITS CAPS Take 50,000 Units by mouth every Monday.      acetaminophen  (TYLENOL ) 325 MG tablet Take 325 mg by mouth every 6 (six) hours as needed for mild pain (pain score 1-3).     albuterol  (VENTOLIN  HFA) 108 (90 Base) MCG/ACT inhaler Inhale 1 puff into the lungs 4 (four) times daily as needed for wheezing or shortness of breath.     ALPRAZolam  (XANAX ) 0.5 MG tablet Take 0.5 mg by mouth 2 (two) times daily.     aspirin  81 MG EC tablet Take 81 mg by mouth daily.     clopidogrel  (PLAVIX ) 75 MG tablet Take 1 tablet (75 mg total) by mouth daily. 90 tablet 2   dapagliflozin  propanediol (FARXIGA ) 10 MG TABS tablet Take 1 tablet (10 mg total) by mouth daily. 30 tablet 0   Dextromethorphan-guaiFENesin  5-100 MG/5ML LIQD Take 10 mLs by mouth every 6 (six) hours as needed (cough). 118 mL 0   EPINEPHrine 0.3 mg/0.3 mL IJ SOAJ injection Inject 0.3 mg into the muscle as needed for anaphylaxis.     escitalopram (LEXAPRO) 10 MG tablet Take 10 mg by mouth daily.     HYDROcodone  bit-homatropine (HYCODAN) 5-1.5 MG/5ML syrup Take 5 mLs by mouth 3 (three) times daily as needed for cough.     levothyroxine  (SYNTHROID ) 25 MCG tablet Take 25 mcg by mouth daily before breakfast.     melatonin 3 MG TABS tablet Take 3 mg by mouth at bedtime.     nitroGLYCERIN  (NITROSTAT ) 0.4 MG SL tablet Place 1 tablet (0.4 mg total) under the tongue every 5 (five) minutes x 3 doses as needed for chest pain (if no relief after 2nd dose, proceed  to ED or call 911). 25 tablet 3   ondansetron  (ZOFRAN ) 4 MG tablet Take 4 mg by mouth 3 (three) times daily as needed for nausea or vomiting.  polyethylene glycol powder (GLYCOLAX/MIRALAX) 17 GM/SCOOP powder Take 17 g by mouth daily. 255 g 0   torsemide  (DEMADEX ) 20 MG tablet Take 1 tablet (20 mg total) by mouth daily. 30 tablet 1   No current facility-administered medications for this visit.    Allergies as of 05/10/2024 - Review Complete 05/10/2024  Allergen Reaction Noted   Citalopram Other (See Comments) and Palpitations 05/21/2011   Ciprofloxacin hcl Diarrhea and Nausea And Vomiting 10/26/2023   Dicyclomine  Diarrhea and Other (See Comments) 05/03/2013   Lamotrigine Diarrhea 03/28/2011   Metoclopramide Other (See Comments) 03/28/2011   Milnacipran hcl Other (See Comments) 01/18/2013   Omeprazole Other (See Comments) 04/25/2021   Pecan nut (diagnostic) Other (See Comments) 04/12/2024   Pregabalin Other (See Comments) 06/27/2022   Serotonin reuptake inhibitors (ssris) Other (See Comments) 06/27/2022   Simvastatin  Other (See Comments) 08/04/2018   Tape Other (See Comments) 05/01/2017   Tizanidine Other (See Comments) 04/25/2021   Trazodone  and nefazodone Other (See Comments) 04/14/2012   Venlafaxine Other (See Comments) 06/27/2022   Celecoxib Itching 06/27/2022   Codeine Itching    Nefazodone Other (See Comments) 09/04/2022   Sertraline  hcl Other (See Comments) 10/16/2011    Social History   Socioeconomic History   Marital status: Divorced    Spouse name: Not on file   Number of children: Not on file   Years of education: Not on file   Highest education level: Not on file  Occupational History   Not on file  Tobacco Use   Smoking status: Former    Current packs/day: 0.00    Types: Cigarettes    Start date: 02/18/1977    Quit date: 02/18/2022    Years since quitting: 2.2   Smokeless tobacco: Never  Vaping Use   Vaping status: Never Used  Substance and Sexual Activity    Alcohol  use: No    Alcohol /week: 0.0 standard drinks of alcohol    Drug use: No   Sexual activity: Not Currently  Other Topics Concern   Not on file  Social History Narrative   Not on file   Social Drivers of Health   Financial Resource Strain: Medium Risk (07/31/2023)   Received from [facility]   Overall Financial Resource Strain (CARDIA)    Difficulty of Paying Living Expenses: Somewhat hard  Food Insecurity: No Food Insecurity (04/12/2024)   Hunger Vital Sign    Worried About Running Out of Food in the Last Year: Never true    Ran Out of Food in the Last Year: Never true  Transportation Needs: No Transportation Needs (04/12/2024)   PRAPARE - Administrator, Civil Service (Medical): No    Lack of Transportation (Non-Medical): No  Physical Activity: Not on file  Stress: Not on file  Social Connections: Unknown (04/18/2024)   Social Connection and Isolation Panel    Frequency of Communication with Friends and Family: Twice a week    Frequency of Social Gatherings with Friends and Family: Once a week    Attends Religious Services: 1 to 4 times per year    Active Member of [employer] or Organizations: No    Attends Engineer, Structural: Never    Marital Status: Patient declined    Review of systems General: negative for  night sweats, fever, chills, weight loss +fatigue  Neck: Negative for lumps, goiter, pain and significant neck swelling Resp: Negative for cough, wheezing, dyspnea at rest CV: Negative for chest pain, leg swelling, palpitations, orthopnea GI: denies vomiting, diarrhea,  constipation, dysphagia, odyonophagia, or unintentional weight loss. +melena (resolved) +hematochezia (resolved) +constipation (resolved) +nausea +early satiety  MSK: Negative for joint pain or swelling, back pain, and muscle pain. Derm: Negative for itching or rash Psych: Denies depression, anxiety, memory loss, confusion. No homicidal or suicidal ideation.  Heme: Negative  for prolonged bleeding, bruising easily, and swollen nodes. Endocrine: Negative for cold or heat intolerance, polyuria, polydipsia and goiter. Neuro: negative for tremor, gait imbalance, syncope and seizures. The remainder of the review of systems is noncontributory.  Physical Exam: BP 98/60 (BP Location: Left Arm, Patient Position: Sitting, Cuff Size: Normal)   Pulse 75   Temp 97.8 F (36.6 C) (Temporal)   Ht 4' 11 (1.499 m)   Wt 146 lb 12.8 oz (66.6 kg)   BMI 29.65 kg/m  General:   Alert and oriented. No distress noted. Pleasant and cooperative.  Head:  Normocephalic and atraumatic. Eyes:  Conjuctiva clear without scleral icterus. Mouth:  Oral mucosa pink and moist. Good dentition. No lesions. Heart: Normal rate and rhythm, s1 and s2 heart sounds present.  Lungs: Clear lung sounds in all lobes. Respirations equal and unlabored. Abdomen:  +BS, soft, non-tender and non-distended. No rebound or guarding. No HSM or masses noted. Derm: No palmar erythema or jaundice Msk:  Symmetrical without gross deformities. Normal posture. Extremities:  Without edema. Neurologic:  Alert and  oriented x4 Psych:  Alert and cooperative. Normal mood and affect.  Invalid input(s): 6 MONTHS   ASSESSMENT: KENYATTA GLOECKNER is a 73 y.o. female presenting today for IDA, rectal bleeding and nausea  Patient with recent onset of IDA, heme positive stool during recent admission for CAP and endorsing some nausea, previously with melena which has resolved. She does note constipation at time of rectal bleeding which also resolved with use of stool softeners. Last TCS in 2018, EGD 2015. As she has new onset of IDA (iron panel normal in April 2025) with history of rectal bleeding and melena, would recommend bidirectional endoscopic evaluation to determine sources of possible GI blood loss. Will start daily iron, continue PPI and stool softener. Indications, risks and benefits of procedure discussed in detail with  patient. Patient verbalized understanding and is in agreement to proceed with EGD and Colonoscopy.    PLAN:  -schedule EGD/Colonoscopy ASA III, needs cards clearance to hold plavix  -start ferrous sulfate 325mg  daily, take with source of Vitamin C for best absorption -continue Protonix  40mg  daily -continue daily stool softner  All questions were answered, patient verbalized understanding and is in agreement with plan as outlined above.   Follow Up: 3 months   Rameses Ou L. Mariette, MSN, APRN, AGNP-C Adult-Gerontology Nurse Practitioner [facility]  I have reviewed the note and agree with the APP's assessment as described in this progress note  Toribio Fortune, MD Gastroenterology and Hepatology [facility]

## 2024-05-10 NOTE — Telephone Encounter (Signed)
 PA on [facility] for TCS/EGD: Notification or Prior Authorization is not required for the requested services You are not required to submit a notification/prior authorization based on the information provided. Decision ID #: [id number]

## 2024-05-10 NOTE — Patient Instructions (Signed)
 Please continue stool softner Continue protonix  40mg  daily We will get you scheduled for EGD and Colonoscopy given low iron, rectal bleeding and history of darker stools I am starting daily iron as well, take this with a source of vitamin C for best absorption  Follow up 3 months  It was a pleasure to see you today. I want to create trusting relationships with patients and provide genuine, compassionate, and quality care. I truly value your feedback! please be on the lookout for a survey regarding your visit with me today. I appreciate your input about our visit and your time in completing this!    Evart Mcdonnell L. Anaysia Germer, MSN, APRN, AGNP-C Adult-Gerontology Nurse Practitioner [facility] G[facility]enter[facility] at [facility]

## 2024-05-10 NOTE — Telephone Encounter (Signed)
 Faxing updated 2 day prep instructions to patient care facility at [phone].

## 2024-05-11 DIAGNOSIS — M6281 Muscle weakness (generalized): Secondary | ICD-10-CM | POA: Diagnosis not present

## 2024-05-23 NOTE — Telephone Encounter (Signed)
 Pre-op team,   Per Dr. Debera Chart reviewed. We have not seen [name] since October of last year, so ideally she needs to have a follow-up visit to review her cardiovascular symptoms prior to making further preprocedure recommendations. It appears scheduling has been an issue. Patient has not been seen in greater than 1 year. She will need an in office follow up visit prior to providing recommendations.   Thank you!  Barnie HERO. Mabeline Varas, DNP, NP-C  05/23/2024, 1:52 PM [facility] [address] Office [phone] Fax [phone]

## 2024-05-23 NOTE — Telephone Encounter (Signed)
 Please advise regarding clearance. Patient scheduled for 11/26 for procedure.

## 2024-05-24 NOTE — Telephone Encounter (Addendum)
 D/w the preop APP pt has appt with Lorette Kapur, [facility] 06/22/24. Ok to defer to Dr. Kapur appt 06/22/24.

## 2024-05-25 NOTE — Telephone Encounter (Signed)
 Clearance not received. Procedure will have to be cancelled for now. Called [facility] and spoke with Kyra and is aware procedure cancelled. Also looking her chart as of 05/23/24 she was at [facility] for the following: Fall, initial encounter   History of coronary artery stent placement   Fibromyalgia  Unspecified myalgia and myositis   Non-STEMI (non-ST elevated myocardial infarction) (CMS-HCC)  Acute myocardial infarction, subendocardial infarction, episode of care unspecified     Patient scheduled to see cardiology 06/22/2024. FYI

## 2024-05-30 NOTE — Telephone Encounter (Signed)
 Thanks, agree with cancellation, this was addressed in other phone encounter as well.

## 2024-05-30 NOTE — Telephone Encounter (Signed)
 [name], please cancel her 11/26 procedure as she needs in office appointment with cardiology for clearance (scheduled on 06/22/2024) - can reschedule her once we have clearance. Please add someone else in this time slot. Niels, please keep us  updated.

## 2024-05-30 NOTE — Telephone Encounter (Signed)
 Good morning Dr. Eartha, I will be sure to keep you updated. Thank you sir.

## 2024-05-30 NOTE — Telephone Encounter (Signed)
 Thanks

## 2024-06-03 ENCOUNTER — Encounter (HOSPITAL_COMMUNITY): Admission: RE | Admit: 2024-06-03 | Source: Ambulatory Visit

## 2024-06-08 ENCOUNTER — Encounter (HOSPITAL_COMMUNITY): Admission: RE | Payer: Self-pay | Source: Home / Self Care

## 2024-06-08 ENCOUNTER — Ambulatory Visit (HOSPITAL_COMMUNITY): Admission: RE | Admit: 2024-06-08 | Admitting: Gastroenterology

## 2024-06-08 SURGERY — COLONOSCOPY
Anesthesia: Choice

## 2024-06-17 NOTE — Progress Notes (Unsigned)
 Cardiology Office Note:  .   Date:  06/17/2024  ID:  [name], DOB 05-24-1951, MRN [id number] PCP: [name], MD  [facility] Providers Cardiologist:  Jayson Sierras, MD {  History of Present Illness: .   [name] is a 73 y.o. female  with PMHx of CAD, carotid artery disease, renal artery stenosis, HFpEF, HTN, HLD, orthostatic hypotension, idiopathic pulmonary fibrosis, GERD, RA, fibromyalgia, hypothyroidism who reports to [facility] office for follow up.   Pertinent cardiac history CAD  s/p DES to RCA and LCx in April 2010  s/p DES to ostial LM 2024 and otherwise medical management  Carotid artery disease  S/p L CEA in 2010 S/p R carotid endarterectomy 2024  Renal artery stenosis HFpEF ECHO 10/21/2023: EF 65-70%, no wall motion abnormalities, and mild concentric LV hypertrophy.   Last seen by heartcare in hospitalization 10/2023 for CHF consult.   Recent hospitalization at [facility] 05/24/2024 for UTI and fall. Cardio consult diagnosed with NSTEMI (Tn 75 >173 > 181), no chest pain complaints, EKG w/o ischemic changes. Echo showed preserved EF with normal wall motion. Suspected acute myocardial injury in setting of UTI. D/C Jardiance given UTI.  Continued on ASA 81 mg daily, Crestor  40 mg daily, Plavix  75 mg daily, NTG patch daily, Torsemide  20 mg daily, midodrine  10 mg daily  High NO show   Today, reports ### and denies ###.  Denies chest pain, shortness of breath, palpitations, syncope, presyncope, dizziness, orthopnea, PND, swelling or significant weight changes, acute bleeding, or claudication.   Reports compliance with medications.  Dietary habitats:  Activity level:  Social: Denies tobacco use/alcohol /drug use  Denies any recent hospitalizations or visits to the emergency department.   Coronary artery disease involving native coronary artery of native heart without angina pectoris Hyperlipidemia LDL goal <70 Denies angina symptoms. No need for further  ischemic evaluation at this time.  LDL? LFT? Continue ASA 81 mg daily, Crestor  40 mg daily, Plavix  75 mg daily, NTG patch daily  Lipid Panel     Component Value Date/Time   CHOL 106 07/04/2023 0330   TRIG 67 07/04/2023 0330   HDL 46 07/04/2023 0330   CHOLHDL 2.3 07/04/2023 0330   VLDL 13 07/04/2023 0330   LDLCALC 47 07/04/2023 0330     Chronic heart failure with preserved ejection fraction (HFpEF) (HCC) ECHO: Denies SOB, edema, orthopnea, or PND. Appears Euvolemic on exam.  Continue on Torsemide  20 mg daily  GDMT limited due to low blood pressure of .SABRA or AKI/CKD Encouraged low sodium diet, fluid restriction <2L, and daily weights.  Educated to contact our office for weight gain of 2 lbs overnight or 5 lbs in one week. ED precautions discussed.      Latest Ref Rng & Units 04/27/2024    8:40 PM 04/18/2024    4:48 AM 04/17/2024    4:39 AM  BMP  Glucose 70 - 99 mg/dL 894  81  90   BUN 8 - 23 mg/dL 24  20  22    Creatinine 0.44 - 1.00 mg/dL 8.50  8.70  8.53   Sodium 135 - 145 mmol/L 137  141  141   Potassium 3.5 - 5.1 mmol/L 3.6  4.1  4.0   Chloride 98 - 111 mmol/L 96  105  105   CO2 22 - 32 mmol/L 30  23  23    Calcium  8.9 - 10.3 mg/dL 9.0  9.5  9.0      Essential hypertension Reports well controlled  Home BP:  BP this OV well controlled today:  Continue on / Managed by GDMT as above.  Encourage physical activity for 150 minutes per week and heart healthy low sodium diet. Discussed limiting sodium intake to < 2 grams daily.     Orthostatic Hypotension  Encourage using compression stockings, staying hydrated, regular meals using salt tablets for load in, slow position changes and abdominal binder. Continue midodrine  10 mg daily  Florinef?    PAD (peripheral artery disease) Carotid stenosis, bilateral Carotid US   No bruits on exam. Denies presyncope/syncope.  Continue to monitor.  Will plan to repeat Carotid US  if bruit present or develops associated symptoms.  Repeat  Carotid US  in 1 year if 40-59%   CKD 3B Baseline creatinine was 1.4-1.7 mg/dL,   ROS: 10 point review of system has been reviewed and considered negative except ones been listed in the HPI.   Studies Reviewed: SABRA   Cath 11/18/2022   Ost LM to Mid LM lesion is 90% stenosed.   Mid Cx lesion is 20% stenosed.   Mid Cx to Dist Cx lesion is 70% stenosed.  Prox Cx lesion is 60% stenosed.   Ost RCA to Prox RCA lesion is 20% stenosed.   Prox RCA to Mid RCA lesion is 30% stenosed.   Prox RCA lesion is 60% stenosed.   RPDA lesion is 100% stenosed. fills via L-R collaterals   RPAV lesion is 60% stenosed.   LV end diastolic pressure is moderately elevated.   There is no aortic valve stenosis.   POST-CATH DIAGNOSES Relatively stable elliptical calcified 90% Left Main stenosis with significant catheter dampening during engagement pressures normalized the pressures in the 80s with significant anginal chest pain Progression of proximal RCA disease but otherwise stable disease elsewhere. Significant pressure gradient in the innominate this up to right subclavian artery with tandem lesions leading to a 40 to 50 mmHg gradient.   RECOMMENDATIONS Will discuss with ICU team determine best course of action, would likely plan unsupported left main PCI with either atherectomy versus shockwave lithotripsy and IVUS guidance. Would likely load with Plavix  once the plan for PCI is made.  Coronary Stent Intervention 11/19/2022   Mid Cx lesion is 20% stenosed.   Mid Cx to Dist Cx lesion is 70% stenosed.   Prox Cx lesion is 60% stenosed.   Ost LM to Mid LM lesion is 90% stenosed.   A drug-eluting stent was successfully placed using a STENT MEGATRON 3.5X8.   Post intervention, there is a 10% residual stenosis.   Successful IVUS guided high risk rotational atherectomy and drug-eluting stent placement to the ostial left main coronary artery extending into the distal segment just before the bifurcation.  Difficult  procedure overall due to heavy eccentric calcifications and short left main. The patient had chest pain and elevated blood pressure with balloon inflations and thus she was started on nitroglycerin  drip with resolution of symptoms.   Recommendations: Dual antiplatelet therapy for at least 1 year. Aggressive treatment of risk factors. Remove venous sheath once ACT is below 200. Diagnostic Dominance: Right  Intervention     Echocardiogram 10/21/2023:  1. Left ventricular ejection fraction, by estimation, is 65 to 70%. The  left ventricle has normal function. The left ventricle has no regional  wall motion abnormalities. There is mild concentric left ventricular  hypertrophy. Left ventricular diastolic  parameters are indeterminate.   2. Right ventricular systolic function is normal. The right ventricular  size is normal. There is normal pulmonary  artery systolic pressure. The  estimated right ventricular systolic pressure is 34.8 mmHg.   3. Left atrial size was mildly dilated.   4. The mitral valve is grossly normal. Trivial mitral valve  regurgitation.   5. The aortic valve is tricuspid. There is mild calcification of the  aortic valve. Aortic valve regurgitation is not visualized. Aortic valve  sclerosis/calcification is present, without any evidence of aortic  stenosis.   6. The inferior vena cava is normal in size with greater than 50%  respiratory variability, suggesting right atrial pressure of 3 mmHg.    Chest CTA 10/22/2023: IMPRESSION: 1. No pulmonary emboli. 2. Mild increase in diffuse prominence of the interstitial markings since 01/13/2022, compatible with interstitial pulmonary edema. 3. Moderate-sized right pleural effusion and small to moderate-sized left pleural effusion. 4. Bilateral lower lobe atelectasis. 5. Interval mediastinal and right hilar adenopathy, nonspecific. 6. Calcific coronary artery and aortic atherosclerosis. 7. Borderline enlarged central  pulmonary arteries, suggesting borderline pulmonary arterial hypertension.    Risk Assessment/Calculations:   {Does this patient have ATRIAL FIBRILLATION?:[id number]} No BP recorded.  {Refresh Note OR Click here to enter BP  :1}***       Physical Exam:   VS:  There were no vitals taken for this visit.   Wt Readings from Last 3 Encounters:  05/10/24 146 lb 12.8 oz (66.6 kg)  04/27/24 150 lb 12.7 oz (68.4 kg)  04/18/24 150 lb 12.7 oz (68.4 kg)    GEN: Well nourished, well developed in no acute distress while sitting in chair.  NECK: No JVD; No carotid bruits CARDIAC: ***RRR, no murmurs, rubs, gallops RESPIRATORY:  Clear to auscultation without rales, wheezing or rhonchi  ABDOMEN: Soft, non-tender, non-distended EXTREMITIES:  No edema; No deformity   ASSESSMENT AND PLAN: .   ***    {Are you ordering a CV Procedure (e.g. stress test, cath, DCCV, TEE, etc)?   Press F2        :[id number]}  Dispo: ***  Signed, Lorette CINDERELLA Kapur, PA-C

## 2024-06-22 ENCOUNTER — Encounter: Payer: Self-pay | Admitting: Physician Assistant

## 2024-06-22 ENCOUNTER — Telehealth: Payer: Self-pay | Admitting: Cardiology

## 2024-06-22 ENCOUNTER — Ambulatory Visit: Attending: Physician Assistant | Admitting: Physician Assistant

## 2024-06-22 VITALS — BP 112/60 | HR 64 | Ht 59.0 in | Wt 149.0 lb

## 2024-06-22 DIAGNOSIS — I2510 Atherosclerotic heart disease of native coronary artery without angina pectoris: Secondary | ICD-10-CM | POA: Diagnosis not present

## 2024-06-22 DIAGNOSIS — E785 Hyperlipidemia, unspecified: Secondary | ICD-10-CM

## 2024-06-22 DIAGNOSIS — I6523 Occlusion and stenosis of bilateral carotid arteries: Secondary | ICD-10-CM

## 2024-06-22 DIAGNOSIS — I5032 Chronic diastolic (congestive) heart failure: Secondary | ICD-10-CM | POA: Diagnosis not present

## 2024-06-22 DIAGNOSIS — Z87898 Personal history of other specified conditions: Secondary | ICD-10-CM | POA: Diagnosis not present

## 2024-06-22 DIAGNOSIS — I739 Peripheral vascular disease, unspecified: Secondary | ICD-10-CM | POA: Diagnosis not present

## 2024-06-22 DIAGNOSIS — I10 Essential (primary) hypertension: Secondary | ICD-10-CM

## 2024-06-22 DIAGNOSIS — I951 Orthostatic hypotension: Secondary | ICD-10-CM | POA: Diagnosis not present

## 2024-06-22 DIAGNOSIS — N183 Chronic kidney disease, stage 3 unspecified: Secondary | ICD-10-CM | POA: Diagnosis not present

## 2024-06-22 MED ORDER — TORSEMIDE 20 MG PO TABS: 10.0000 mg | ORAL_TABLET | Freq: Every day | ORAL | 3 refills | Status: AC

## 2024-06-22 MED ORDER — ABDOMINAL BINDER/ELASTIC MED MISC: 1.0000 | 0 refills | Status: AC

## 2024-06-22 NOTE — Telephone Encounter (Signed)
 Pt c/o medication issue:  1. Name of Medication:   torsemide  (DEMADEX ) 20 MG tablet   2. How are you currently taking this medication (dosage and times per day)?   3. Are you having a reaction (difficulty breathing--STAT)?   4. What is your medication issue?   Caller Harles) wants a call back to clarify exact instructions for patient's medication as patient lives in an Assisted Living facility and will be going out of town on Saturday, 12/13.  Caller stated they will need further information in writing and signed as they do not have medical staff at the facility.

## 2024-06-22 NOTE — Patient Instructions (Signed)
 Medication Instructions:   DECREASE Torsemide  to 10 mg daily  May increase dose to 20 mg if volume overload is noticed  Labwork: BMET today  Testing/Procedures: None today  Follow-Up: 2-3 months  Any Other Special Instructions Will Be Listed Below (If Applicable).    Wear compression stockings during the day  Wear abdominal binder during the day   Drink 40-60 ounces of water  daily  If you need a refill on your cardiac medications before your next appointment, please call your pharmacy.

## 2024-06-22 NOTE — Telephone Encounter (Signed)
 Provider recommendation sent to sylvia at [facility].

## 2024-06-22 NOTE — Telephone Encounter (Signed)
 Decrease Torsemide  from 20 mg to 10 mg daily due to hypotension and dizziness.  Discussed if signs of volume overload or worsening SOB/edema then contact office and we will consider increasing torsemide  back to 20 mg.   Spoke to Sylvia who stated that pt was going out of the state for 2 weeks with her son. Advised Kyra that if son notices more SOB and swelling in legs/feet he could give our office a call to discuss increasing medication. Kyra stated pt would not have abdominal binder or compression stockings to take on her trip.  Kyra would like clarification on checking bp. Please clarify if pt needs bp checked daily or monthly with extra checks when she is dizzy.

## 2024-07-06 ENCOUNTER — Encounter (INDEPENDENT_AMBULATORY_CARE_PROVIDER_SITE_OTHER): Payer: Self-pay | Admitting: Gastroenterology

## 2024-07-07 ENCOUNTER — Other Ambulatory Visit: Payer: Self-pay

## 2024-07-07 ENCOUNTER — Inpatient Hospital Stay (HOSPITAL_COMMUNITY)
Admission: EM | Admit: 2024-07-07 | Discharge: 2024-07-11 | DRG: 193 | Disposition: A | Discharge status: Skilled Nursing Facility | Source: Skilled Nursing Facility | Attending: Family Medicine | Admitting: Family Medicine

## 2024-07-07 ENCOUNTER — Encounter (HOSPITAL_COMMUNITY): Payer: Self-pay | Admitting: Emergency Medicine

## 2024-07-07 ENCOUNTER — Emergency Department (HOSPITAL_COMMUNITY)

## 2024-07-07 DIAGNOSIS — J101 Influenza due to other identified influenza virus with other respiratory manifestations: Principal | ICD-10-CM | POA: Diagnosis present

## 2024-07-07 DIAGNOSIS — Z833 Family history of diabetes mellitus: Secondary | ICD-10-CM

## 2024-07-07 DIAGNOSIS — M069 Rheumatoid arthritis, unspecified: Secondary | ICD-10-CM | POA: Diagnosis present

## 2024-07-07 DIAGNOSIS — Z79899 Other long term (current) drug therapy: Secondary | ICD-10-CM

## 2024-07-07 DIAGNOSIS — Z7902 Long term (current) use of antithrombotics/antiplatelets: Secondary | ICD-10-CM

## 2024-07-07 DIAGNOSIS — J9611 Chronic respiratory failure with hypoxia: Secondary | ICD-10-CM | POA: Diagnosis not present

## 2024-07-07 DIAGNOSIS — J84112 Idiopathic pulmonary fibrosis: Secondary | ICD-10-CM | POA: Diagnosis present

## 2024-07-07 DIAGNOSIS — Z9049 Acquired absence of other specified parts of digestive tract: Secondary | ICD-10-CM

## 2024-07-07 DIAGNOSIS — E871 Hypo-osmolality and hyponatremia: Secondary | ICD-10-CM | POA: Diagnosis present

## 2024-07-07 DIAGNOSIS — I252 Old myocardial infarction: Secondary | ICD-10-CM

## 2024-07-07 DIAGNOSIS — Z8261 Family history of arthritis: Secondary | ICD-10-CM

## 2024-07-07 DIAGNOSIS — Z1152 Encounter for screening for COVID-19: Secondary | ICD-10-CM

## 2024-07-07 DIAGNOSIS — I509 Heart failure, unspecified: Secondary | ICD-10-CM

## 2024-07-07 DIAGNOSIS — D649 Anemia, unspecified: Secondary | ICD-10-CM

## 2024-07-07 DIAGNOSIS — I10 Essential (primary) hypertension: Secondary | ICD-10-CM | POA: Diagnosis present

## 2024-07-07 DIAGNOSIS — Z87891 Personal history of nicotine dependence: Secondary | ICD-10-CM

## 2024-07-07 DIAGNOSIS — Z90710 Acquired absence of both cervix and uterus: Secondary | ICD-10-CM

## 2024-07-07 DIAGNOSIS — Z96659 Presence of unspecified artificial knee joint: Secondary | ICD-10-CM | POA: Diagnosis present

## 2024-07-07 DIAGNOSIS — D696 Thrombocytopenia, unspecified: Secondary | ICD-10-CM | POA: Diagnosis present

## 2024-07-07 DIAGNOSIS — J9621 Acute and chronic respiratory failure with hypoxia: Secondary | ICD-10-CM | POA: Diagnosis present

## 2024-07-07 DIAGNOSIS — I130 Hypertensive heart and chronic kidney disease with heart failure and stage 1 through stage 4 chronic kidney disease, or unspecified chronic kidney disease: Secondary | ICD-10-CM | POA: Diagnosis present

## 2024-07-07 DIAGNOSIS — Z79890 Hormone replacement therapy (postmenopausal): Secondary | ICD-10-CM

## 2024-07-07 DIAGNOSIS — J189 Pneumonia, unspecified organism: Secondary | ICD-10-CM

## 2024-07-07 DIAGNOSIS — R7989 Other specified abnormal findings of blood chemistry: Secondary | ICD-10-CM | POA: Diagnosis present

## 2024-07-07 DIAGNOSIS — Z8249 Family history of ischemic heart disease and other diseases of the circulatory system: Secondary | ICD-10-CM

## 2024-07-07 DIAGNOSIS — Z83438 Family history of other disorder of lipoprotein metabolism and other lipidemia: Secondary | ICD-10-CM

## 2024-07-07 DIAGNOSIS — Z888 Allergy status to other drugs, medicaments and biological substances status: Secondary | ICD-10-CM

## 2024-07-07 DIAGNOSIS — J9601 Acute respiratory failure with hypoxia: Principal | ICD-10-CM

## 2024-07-07 DIAGNOSIS — R739 Hyperglycemia, unspecified: Secondary | ICD-10-CM

## 2024-07-07 DIAGNOSIS — E039 Hypothyroidism, unspecified: Secondary | ICD-10-CM | POA: Diagnosis present

## 2024-07-07 DIAGNOSIS — J9811 Atelectasis: Secondary | ICD-10-CM | POA: Diagnosis present

## 2024-07-07 DIAGNOSIS — Z981 Arthrodesis status: Secondary | ICD-10-CM

## 2024-07-07 DIAGNOSIS — D631 Anemia in chronic kidney disease: Secondary | ICD-10-CM | POA: Diagnosis present

## 2024-07-07 DIAGNOSIS — I5033 Acute on chronic diastolic (congestive) heart failure: Secondary | ICD-10-CM | POA: Diagnosis not present

## 2024-07-07 DIAGNOSIS — Z7982 Long term (current) use of aspirin: Secondary | ICD-10-CM

## 2024-07-07 DIAGNOSIS — M797 Fibromyalgia: Secondary | ICD-10-CM | POA: Diagnosis present

## 2024-07-07 DIAGNOSIS — E785 Hyperlipidemia, unspecified: Secondary | ICD-10-CM | POA: Diagnosis present

## 2024-07-07 DIAGNOSIS — I2510 Atherosclerotic heart disease of native coronary artery without angina pectoris: Secondary | ICD-10-CM | POA: Diagnosis present

## 2024-07-07 DIAGNOSIS — N1832 Chronic kidney disease, stage 3b: Secondary | ICD-10-CM | POA: Diagnosis present

## 2024-07-07 DIAGNOSIS — N289 Disorder of kidney and ureter, unspecified: Secondary | ICD-10-CM

## 2024-07-07 DIAGNOSIS — G2581 Restless legs syndrome: Secondary | ICD-10-CM | POA: Diagnosis present

## 2024-07-07 LAB — COMPREHENSIVE METABOLIC PANEL WITH GFR
ALT: 7 U/L (ref 0–44)
AST: 25 U/L (ref 15–41)
Albumin: 4.7 g/dL (ref 3.5–5.0)
Alkaline Phosphatase: 84 U/L (ref 38–126)
Anion gap: 17 — ABNORMAL HIGH (ref 5–15)
BUN: 16 mg/dL (ref 8–23)
CO2: 21 mmol/L — ABNORMAL LOW (ref 22–32)
Calcium: 9.3 mg/dL (ref 8.9–10.3)
Chloride: 93 mmol/L — ABNORMAL LOW (ref 98–111)
Creatinine, Ser: 1.13 mg/dL — ABNORMAL HIGH (ref 0.44–1.00)
GFR, Estimated: 50 mL/min — ABNORMAL LOW
Glucose, Bld: 114 mg/dL — ABNORMAL HIGH (ref 70–99)
Potassium: 4 mmol/L (ref 3.5–5.1)
Sodium: 131 mmol/L — ABNORMAL LOW (ref 135–145)
Total Bilirubin: 0.5 mg/dL (ref 0.0–1.2)
Total Protein: 8.1 g/dL (ref 6.5–8.1)

## 2024-07-07 LAB — TROPONIN T, HIGH SENSITIVITY
Troponin T High Sensitivity: 24 ng/L — ABNORMAL HIGH (ref 0–19)
Troponin T High Sensitivity: 167 ng/L (ref 0–19)

## 2024-07-07 LAB — CBC WITH DIFFERENTIAL/PLATELET
Abs Immature Granulocytes: 0.04 K/uL (ref 0.00–0.07)
Basophils Absolute: 0 K/uL (ref 0.0–0.1)
Basophils Relative: 0 %
Eosinophils Absolute: 0.1 K/uL (ref 0.0–0.5)
Eosinophils Relative: 1 %
HCT: 36.2 % (ref 36.0–46.0)
Hemoglobin: 11.8 g/dL — ABNORMAL LOW (ref 12.0–15.0)
Immature Granulocytes: 0 %
Lymphocytes Relative: 14 %
Lymphs Abs: 1.3 K/uL (ref 0.7–4.0)
MCH: 31.7 pg (ref 26.0–34.0)
MCHC: 32.6 g/dL (ref 30.0–36.0)
MCV: 97.3 fL (ref 80.0–100.0)
Monocytes Absolute: 1 K/uL (ref 0.1–1.0)
Monocytes Relative: 11 %
Neutro Abs: 6.9 K/uL (ref 1.7–7.7)
Neutrophils Relative %: 74 %
Platelets: 188 K/uL (ref 150–400)
RBC: 3.72 MIL/uL — ABNORMAL LOW (ref 3.87–5.11)
RDW: 15.1 % (ref 11.5–15.5)
WBC: 9.4 K/uL (ref 4.0–10.5)
nRBC: 0 % (ref 0.0–0.2)

## 2024-07-07 LAB — BLOOD GAS, VENOUS
Acid-Base Excess: 5 mmol/L — ABNORMAL HIGH (ref 0.0–2.0)
Bicarbonate: 30.9 mmol/L — ABNORMAL HIGH (ref 20.0–28.0)
Drawn by: 53361
O2 Saturation: 92.3 %
Patient temperature: 36.6
pCO2, Ven: 50 mmHg (ref 44–60)
pH, Ven: 7.4 (ref 7.25–7.43)
pO2, Ven: 58 mmHg — ABNORMAL HIGH (ref 32–45)

## 2024-07-07 LAB — GLUCOSE, CAPILLARY: Glucose-Capillary: 117 mg/dL — ABNORMAL HIGH (ref 70–99)

## 2024-07-07 LAB — LACTIC ACID, PLASMA: Lactic Acid, Venous: 0.7 mmol/L (ref 0.5–1.9)

## 2024-07-07 LAB — RESP PANEL BY RT-PCR (RSV, FLU A&B, COVID)  RVPGX2
Influenza A by PCR: POSITIVE — AB
Influenza B by PCR: NEGATIVE
Resp Syncytial Virus by PCR: NEGATIVE
SARS Coronavirus 2 by RT PCR: NEGATIVE

## 2024-07-07 LAB — PRO BRAIN NATRIURETIC PEPTIDE: Pro Brain Natriuretic Peptide: 3200 pg/mL — ABNORMAL HIGH

## 2024-07-07 LAB — PROCALCITONIN: Procalcitonin: 0.16 ng/mL

## 2024-07-07 MED ORDER — OSELTAMIVIR PHOSPHATE 75 MG PO CAPS
75.0000 mg | ORAL_CAPSULE | Freq: Two times a day (BID) | ORAL | Status: DC
  Administered 2024-07-07: 75 mg via ORAL
  Filled 2024-07-07: qty 1

## 2024-07-07 MED ORDER — SODIUM CHLORIDE 0.9 % IV SOLN
500.0000 mg | Freq: Once | INTRAVENOUS | Status: AC
  Administered 2024-07-07: 500 mg via INTRAVENOUS
  Filled 2024-07-07: qty 5

## 2024-07-07 MED ORDER — ROSUVASTATIN CALCIUM 20 MG PO TABS
40.0000 mg | ORAL_TABLET | Freq: Every day | ORAL | Status: DC
  Administered 2024-07-07 – 2024-07-11 (×5): 40 mg via ORAL
  Filled 2024-07-07 (×4): qty 2

## 2024-07-07 MED ORDER — OXYCODONE-ACETAMINOPHEN 5-325 MG PO TABS
1.0000 | ORAL_TABLET | Freq: Once | ORAL | Status: AC
  Administered 2024-07-07: 1 via ORAL
  Filled 2024-07-07: qty 1

## 2024-07-07 MED ORDER — NITROGLYCERIN 2 % TD OINT
1.0000 [in_us] | TOPICAL_OINTMENT | Freq: Once | TRANSDERMAL | Status: AC
  Administered 2024-07-07: 1 [in_us] via TOPICAL
  Filled 2024-07-07: qty 1

## 2024-07-07 MED ORDER — ALBUTEROL SULFATE (2.5 MG/3ML) 0.083% IN NEBU: 2.5000 mg | INHALATION_SOLUTION | RESPIRATORY_TRACT | Status: DC | PRN

## 2024-07-07 MED ORDER — OXYCODONE-ACETAMINOPHEN 5-325 MG PO TABS
1.0000 | ORAL_TABLET | Freq: Four times a day (QID) | ORAL | Status: DC | PRN
  Administered 2024-07-08 – 2024-07-11 (×9): 1 via ORAL
  Filled 2024-07-07 (×8): qty 1

## 2024-07-07 MED ORDER — SODIUM CHLORIDE 0.9 % IV SOLN: 2.0000 g | INTRAVENOUS | Status: DC

## 2024-07-07 MED ORDER — ACETAMINOPHEN 650 MG RE SUPP: 650.0000 mg | Freq: Four times a day (QID) | RECTAL | Status: DC | PRN

## 2024-07-07 MED ORDER — ENOXAPARIN SODIUM 40 MG/0.4ML IJ SOSY
40.0000 mg | PREFILLED_SYRINGE | INTRAMUSCULAR | Status: DC
  Administered 2024-07-07: 40 mg via SUBCUTANEOUS
  Filled 2024-07-07: qty 0.4

## 2024-07-07 MED ORDER — FUROSEMIDE 10 MG/ML IJ SOLN
40.0000 mg | Freq: Once | INTRAMUSCULAR | Status: AC
  Administered 2024-07-07: 40 mg via INTRAVENOUS
  Filled 2024-07-07: qty 4

## 2024-07-07 MED ORDER — OSELTAMIVIR PHOSPHATE 30 MG PO CAPS
30.0000 mg | ORAL_CAPSULE | Freq: Two times a day (BID) | ORAL | Status: DC
  Administered 2024-07-08 – 2024-07-11 (×7): 30 mg via ORAL
  Filled 2024-07-07 (×6): qty 1

## 2024-07-07 MED ORDER — GUAIFENESIN-DM 100-10 MG/5ML PO SYRP
15.0000 mL | ORAL_SOLUTION | Freq: Three times a day (TID) | ORAL | Status: AC
  Administered 2024-07-07 – 2024-07-08 (×3): 15 mL via ORAL
  Filled 2024-07-07 (×3): qty 15

## 2024-07-07 MED ORDER — ACETAMINOPHEN 325 MG PO TABS
650.0000 mg | ORAL_TABLET | Freq: Four times a day (QID) | ORAL | Status: DC | PRN
  Administered 2024-07-08 – 2024-07-11 (×3): 650 mg via ORAL
  Filled 2024-07-07 (×2): qty 2

## 2024-07-07 MED ORDER — CLOPIDOGREL BISULFATE 75 MG PO TABS
75.0000 mg | ORAL_TABLET | Freq: Every day | ORAL | Status: DC
  Administered 2024-07-08 – 2024-07-11 (×4): 75 mg via ORAL
  Filled 2024-07-07 (×3): qty 1

## 2024-07-07 MED ORDER — SODIUM CHLORIDE 0.9 % IV SOLN
2.0000 g | Freq: Once | INTRAVENOUS | Status: AC
  Administered 2024-07-07: 2 g via INTRAVENOUS
  Filled 2024-07-07: qty 20

## 2024-07-07 MED ORDER — SODIUM CHLORIDE 0.9 % IV SOLN: 500.0000 mg | INTRAVENOUS | Status: DC

## 2024-07-07 MED ORDER — SODIUM CHLORIDE 0.9 % IV SOLN
12.5000 mg | Freq: Four times a day (QID) | INTRAVENOUS | Status: DC | PRN
  Administered 2024-07-07: 12.5 mg via INTRAVENOUS
  Filled 2024-07-07: qty 0.5

## 2024-07-07 MED ORDER — PROMETHAZINE HCL 25 MG/ML IJ SOLN
INTRAMUSCULAR | Status: AC
  Filled 2024-07-07: qty 1

## 2024-07-07 NOTE — ED Notes (Signed)
 X-ray at bedside.

## 2024-07-07 NOTE — ED Triage Notes (Signed)
 Pt brought in by EMS for cough since last night and shortness of breath.  Pt endorses heaviness feeling on chest that started last night.  Pt endorses Nausea, no vomiting.  Pt endorses burning with urination that started a few days ago.  Pt on 2L in triage which is baseline.   HX: CHF, COPD

## 2024-07-07 NOTE — ED Provider Notes (Signed)
 " [place] EMERGENCY DEPARTMENT AT [facility] Provider Note   CSN: [id number] Arrival date & time: 07/07/24  1543     Patient presents with: Shortness of Breath and Chest Pain   [name] is a 73 y.o. female.   The history is provided by the patient.  Shortness of Breath Associated symptoms: chest pain   Chest Pain Associated symptoms: shortness of breath    He has history of hypertension, hyperlipidemia, coronary artery disease, GERD and comes in because of cough and congestion and tightness in her chest since last night.  She denies fever but does endorse chills and sweats and bodyaches.  She does not know if she has had any contact with anyone with influenza, but did receive the influenza immunization.  Of note, she is on oxygen at 2 L/min at baseline.    Prior to Admission medications  Medication Sig Start Date End Date Taking? Authorizing Provider  acetaminophen  (TYLENOL ) 325 MG tablet Take 325 mg by mouth every 6 (six) hours as needed for mild pain (pain score 1-3). 11/11/23   [provider]  albuterol  (VENTOLIN  HFA) 108 (90 Base) MCG/ACT inhaler Inhale 1 puff into the lungs 4 (four) times daily as needed for wheezing or shortness of breath. 04/22/21   [provider]  ALPRAZolam  (XANAX ) 0.5 MG tablet Take 0.5 mg by mouth 2 (two) times daily. 03/30/24   [provider]  aspirin  81 MG EC tablet Take 81 mg by mouth daily.    [provider]  cefdinir  (OMNICEF ) 300 MG capsule Take 1 capsule (300 mg total) by mouth 2 (two) times daily. 04/18/24   Evonnie Lenis, MD  clopidogrel  (PLAVIX ) 75 MG tablet Take 1 tablet (75 mg total) by mouth daily. 12/25/22   Debera Jayson MATSU, MD  cyanocobalamin  1000 MCG tablet Take 1,000 mcg by mouth daily.    [provider]  dapagliflozin  propanediol (FARXIGA ) 10 MG TABS tablet Take 1 tablet (10 mg total) by mouth daily. 10/27/23   Arrien, Elidia Sieving, MD  doxycycline  (VIBRA -TABS) 100 MG  tablet Take 1 tablet (100 mg total) by mouth every 12 (twelve) hours. 04/18/24   Evonnie Lenis, MD  Elastic Bandages & Supports (ABDOMINAL BINDER/ELASTIC MED) MISC 1 each by Does not apply route as directed. 06/22/24   Sheron Lorette GRADE, PA-C  EPINEPHrine 0.3 mg/0.3 mL IJ SOAJ injection Inject 0.3 mg into the muscle as needed for anaphylaxis. 07/13/23   [provider]  escitalopram  (LEXAPRO ) 10 MG tablet Take 10 mg by mouth daily. 03/18/24   [provider]  ferrous sulfate  325 (65 FE) MG tablet Take 1 tablet (325 mg total) by mouth daily with breakfast. 05/10/24   Carlan, Chelsea L, NP  HYDROcodone  bit-homatropine (HYCODAN) 5-1.5 MG/5ML syrup Take 5 mLs by mouth 3 (three) times daily as needed for cough. 04/04/24   [provider]  hydrocortisone  (ANUSOL -HC) 25 MG suppository Place 1 suppository (25 mg total) rectally 2 (two) times daily. 04/18/24   Evonnie Lenis, MD  levothyroxine  (SYNTHROID ) 25 MCG tablet Take 25 mcg by mouth daily before breakfast. 02/16/20   [provider]  melatonin 3 MG TABS tablet Take 3 mg by mouth at bedtime.    [provider]  methocarbamol  (ROBAXIN ) 500 MG tablet Take 1,000 mg by mouth in the morning and at bedtime. 11/23/23   [provider]  midodrine  (PROAMATINE ) 10 MG tablet Take 1 tablet (10 mg total) by mouth 3 (three) times daily with meals. 04/18/24  Evonnie Lenis, MD  Multiple Vitamins-Minerals (PRESERVISION AREDS 2) CAPS Take 2 capsules by mouth daily.    [provider]  nitroGLYCERIN  (NITROSTAT ) 0.4 MG SL tablet Place 1 tablet (0.4 mg total) under the tongue every 5 (five) minutes x 3 doses as needed for chest pain (if no relief after 2nd dose, proceed to ED or call 911). 06/08/23   Debera Jayson MATSU, MD  ondansetron  (ZOFRAN ) 4 MG tablet Take 4 mg by mouth 3 (three) times daily as needed for nausea or vomiting. 06/03/21   [provider]  Allie Gutta Calcium  500 MG TABS Take 1 tablet by mouth 2 (two)  times daily. 09/22/23   [provider]  pantoprazole  (PROTONIX ) 40 MG tablet Take 40 mg by mouth daily. 10/08/22   [provider]  polyethylene glycol powder (GLYCOLAX /MIRALAX ) 17 GM/SCOOP powder Take 17 g by mouth daily. 04/28/24   Roselyn Carlin NOVAK, MD  rosuvastatin  (CRESTOR ) 40 MG tablet Take 1 tablet (40 mg total) by mouth daily. 10/26/23   Arrien, Elidia Sieving, MD  torsemide  (DEMADEX ) 20 MG tablet Take 0.5 tablets (10 mg total) by mouth daily. 06/22/24   Sheron Lorette GRADE, PA-C  traZODone  (DESYREL ) 100 MG tablet Take 100 mg by mouth at bedtime. 03/24/24   [provider]  Vitamin D , Ergocalciferol , (DRISDOL ) 50000 UNITS CAPS Take 50,000 Units by mouth every Monday.     [provider]    Allergies: Citalopram, Ciprofloxacin hcl, Dicyclomine , Lamotrigine, Metoclopramide, Milnacipran hcl, Omeprazole, Pecan nut (diagnostic), Pregabalin, Serotonin reuptake inhibitors (ssris), Simvastatin , Tape, Tizanidine, Trazodone  and nefazodone, Venlafaxine, Celecoxib, Codeine, Nefazodone, and Sertraline  hcl    Review of Systems  Respiratory:  Positive for shortness of breath.   Cardiovascular:  Positive for chest pain.  All other systems reviewed and are negative.   Updated Vital Signs BP (!) 205/76   Pulse 100   Temp 99.4 F (37.4 C) (Oral)   Resp (!) 41   Ht 4' 11 (1.499 m)   Wt 67 kg   SpO2 91%   BMI 29.83 kg/m   Physical Exam Vitals and nursing note reviewed.   73 year old female, resting comfortably and in no acute distress. Vital signs are significant for elevated blood pressure and respiratory rate. Oxygen saturation is 91%, which is borderline low-however, this is only maintained with oxygen at 4 L/min which is significantly above her baseline. Head is normocephalic and atraumatic. PERRLA, EOMI. Oropharynx is clear. Neck is nontender and supple without adenopathy. Lungs have bibasilar rales or rhonchi. Chest is nontender. Heart has regular rate  and rhythm without murmur. Abdomen is soft, flat, nontender. Extremities have trace edema, full range of motion is present. Skin is warm and dry without rash. Neurologic: Awake and alert, moves all extremities equally.  (all labs ordered are listed, but only abnormal results are displayed) Labs Reviewed  RESP PANEL BY RT-PCR (RSV, FLU A&B, COVID)  RVPGX2 - Abnormal; Notable for the following components:      Result Value   Influenza A by PCR POSITIVE (*)    All other components within normal limits  CBC WITH DIFFERENTIAL/PLATELET - Abnormal; Notable for the following components:   RBC 3.72 (*)    Hemoglobin 11.8 (*)    All other components within normal limits  COMPREHENSIVE METABOLIC PANEL WITH GFR - Abnormal; Notable for the following components:   Sodium 131 (*)    Chloride 93 (*)    CO2 21 (*)    Glucose, Bld 114 (*)  Creatinine, Ser 1.13 (*)    GFR, Estimated 50 (*)    Anion gap 17 (*)    All other components within normal limits  PRO BRAIN NATRIURETIC PEPTIDE - Abnormal; Notable for the following components:   Pro Brain Natriuretic Peptide 3,200.0 (*)    All other components within normal limits  TROPONIN T, HIGH SENSITIVITY - Abnormal; Notable for the following components:   Troponin T High Sensitivity 24 (*)    All other components within normal limits    EKG: EKG Interpretation Date/Time:  Thursday July 07 2024 15:56:53 EST Ventricular Rate:  103 PR Interval:  188 QRS Duration:  71 QT Interval:  322 QTC Calculation: 422 R Axis:   57  Text Interpretation: Sinus tachycardia LAE, consider biatrial enlargement Probable LVH with secondary repol abnrm When compared with ECG of 04/27/2024, HEART RATE has increased Confirmed by Raford Lenis ([id number]) on 07/07/2024 5:09:08 PM  Radiology: [facility] Chest Port 1 View Result Date: 07/07/2024 CLINICAL DATA:  Cough and shortness of breath. EXAM: PORTABLE CHEST 1 VIEW COMPARISON:  April 27, 2014 FINDINGS: The cardiac  silhouette is mildly enlarged and unchanged in size. Mild to moderate severity diffuse, chronic appearing increased interstitial lung markings are noted. Mild areas of superimposed atelectasis and/or infiltrate are seen within the bilateral lung bases. No pleural effusion or pneumothorax is identified. Postoperative changes are seen throughout the cervical spine with multilevel degenerative changes present throughout the thoracic spine. IMPRESSION: Chronic appearing increased interstitial lung markings with mild bibasilar atelectasis and/or infiltrate. Electronically Signed   By: Suzen Dials M.D.   On: 07/07/2024 16:21   Cardiac monitor showed sinus tachycardia, per my interpretation.  Procedures   Medications Ordered in the ED  nitroGLYCERIN  (NITROGLYN) 2 % ointment 1 inch (has no administration in time range)  furosemide  (LASIX ) injection 40 mg (has no administration in time range)  oseltamivir  (TAMIFLU ) capsule 75 mg (has no administration in time range)  cefTRIAXone  (ROCEPHIN ) 2 g in sodium chloride  0.9 % 100 mL IVPB (has no administration in time range)                                    Medical Decision Making Amount and/or Complexity of Data Reviewed Labs: ordered. Radiology: ordered.  Risk Prescription drug management.   Cough and dyspnea which is likely either influenza or pneumonia.  Also, consider heart failure, ACS.  Patient presentation with wide range of treatment options and carries with a high risk of morbidity and complications.  I have reviewed her electrocardiogram, my interpretation is sinus tachycardia with LVH unchanged from prior except for faster rate.  Chest x-ray shows chronic interstitial changes with bibasilar atelectasis and/or infiltrate.  I have independently viewed the images, and I am concerned for post cardiac infiltrate.  I reviewed her laboratory tests, my interpretation is mild hyponatremia which is not felt to be clinically significant, minimal  renal insufficiency which is improved over baseline, elevated random glucose level, minimally elevated troponin which is felt to be secondary to demand ischemia, markedly elevated proBNP consistent with heart failure, mild anemia which is improved over baseline, positive PCR for influenza.  Symptoms seem to be multifactorial with both influenza and pneumonia and heart failure all present.  I have ordered topical nitroglycerin , oral low-salt, beer, intravenous furosemide .  Because of worsening oxygen requirement, she will need to be admitted.  I discussed case with Dr. Guyann of [facility], who  agrees to admit the patient.     Final diagnoses:  Acute respiratory failure with hypoxia (HCC)  Influenza A  Acute exacerbation of chronic heart failure (HCC)  Community acquired pneumonia, unspecified laterality  Hyponatremia  Elevated random blood glucose level  Renal insufficiency  Normochromic normocytic anemia    ED Discharge Orders     None          Raford Lenis, MD 07/07/24 1750  "

## 2024-07-07 NOTE — H&P (Addendum)
 " History and Physical    ALMYRA BIRMAN FMW:[id number] DOB: 03-Sep-1950 DOA: 07/07/2024  PCP: [name], MD   Patient coming from: Home  I have personally briefly reviewed patient's old medical records in [facility]  Chief Complaint: Difficulty breathing  HPI: TARRAH FURUTA is a 73 y.o. female with medical history significant for coronary artery disease, diastolic CHF, CKD 3B, hypertension, hypothyroidism, rheumatoid arthritis, chronic respiratory failure on 2 L Patient presented to ED with complaints of cough and difficulty breathing that started last night with congestion.  She reports vomiting 3 times today.  No abdominal pain no diarrhea.  She reports central nonradiating, central heavy chest pain that is worse with coughing.  This chest pain is different from her prior heart attack chest pain.  No lower extremity swelling.  No fevers no chills.  She has been compliant with torsemide  10 mg daily.  She reports checking her weights regularly, and denies weight gain.  ED Course: Temperature 99.4.  Heart rate 94-109.  Respiratory rate 24-41.  Blood pressure 117-210 systolic.  O2 sats 90 to 95% on 6 L. Troponin 24. proBNP 3200 Influenza positive. Chest x-ray-chronic appearing increased interstitial lung markings with mild bibasilar atelectasis and or infiltrate. Tamiflu , Lasix  40 mg, ceftriaxone  and azithromycin  given in ED.  Review of Systems: As per HPI all other systems reviewed and negative.  Past Medical History:  Diagnosis Date   Abnormal chest CT    Anxiety    Arthritis    Rhumatoid and Osteoarthritis   Carotid artery disease    Dr. Serene, s/p left CEA, severe R ICA stenosis   Coronary atherosclerosis of native coronary artery    DES RCA/circumflex 4/10, LVEF 65 -> repeat cath 11/2021 with multivessel CAD including LM stenosis, initial med rx   Depression    Essential hypertension    Fibromyalgia    GERD (gastroesophageal reflux disease)    Hiatal hernia     Hyperlipidemia    Kidney cysts    Macular degeneration    NSTEMI (non-ST elevated myocardial infarction) (HCC)    4/10   Renal artery stenosis    Left renal artery stent 12/13   Restless leg syndrome    Sleep apnea    Spondylosis without myelopathy    Tobacco abuse     Past Surgical History:  Procedure Laterality Date   ABDOMINAL HYSTERECTOMY  07/15/1975   BREAST LUMPECTOMY     CAROTID ENDARTERECTOMY Left 02/28/2009   CHOLECYSTECTOMY  07/14/1989   COLONOSCOPY     COLONOSCOPY N/A 05/08/2017   Procedure: COLONOSCOPY;  Surgeon: Golda Claudis PENNER, MD;  [facility];  Service: Endoscopy;  Laterality: N/A;  12:00   CORONARY ATHERECTOMY N/A 11/19/2022   Procedure: CORONARY ATHERECTOMY;  Surgeon: Darron Deatrice LABOR, MD;  [facility]: [facility];  Service: Cardiovascular;  Laterality: N/A;   CORONARY STENT INTERVENTION N/A 11/19/2022   Procedure: CORONARY STENT INTERVENTION;  Surgeon: Darron Deatrice LABOR, MD;  [facility]: [facility];  Service: Cardiovascular;  Laterality: N/A;   CORONARY ULTRASOUND/IVUS N/A 11/19/2022   Procedure: Coronary Ultrasound/IVUS;  Surgeon: Darron Deatrice LABOR, MD;  [facility];  Service: Cardiovascular;  Laterality: N/A;   ENDARTERECTOMY Right 07/03/2023   Procedure: ENDARTERECTOMY CAROTID WITH XENOSURE BIOLOGIC PATCH ANGIOPLASTY;  Surgeon: Serene Gaile ORN, MD;  Location: [facility] OR;  Service: Vascular;  Laterality: Right;   ESOPHAGOGASTRODUODENOSCOPY N/A 04/07/2013   Procedure: ESOPHAGOGASTRODUODENOSCOPY (EGD);  Surgeon: Claudis PENNER Golda, MD;  Location: [facility];  Service: Endoscopy;  Laterality: N/A;  250   ESOPHAGOGASTRODUODENOSCOPY N/A 02/02/2014   Procedure: ESOPHAGOGASTRODUODENOSCOPY (EGD);  Surgeon: Claudis RAYMOND Rivet, MD;  [facility];  Service: Endoscopy;  Laterality: N/A;  120   EYE SURGERY     Catartact bilateral   INSERTION OF RETROGRADE CAROTID STENT Right 07/03/2023   Procedure: Right common carotid  angiogram;  Surgeon: Serene Gaile ORN, MD;  Location: [facility] OR;  Service: Vascular;  Laterality: Right;   JOINT REPLACEMENT  09/12/2011   Left knee   JOINT REPLACEMENT  07/14/2004   Left Knee   kidney stent     Left December 2013   LEFT HEART CATH AND CORONARY ANGIOGRAPHY N/A 11/13/2021   Procedure: LEFT HEART CATH AND CORONARY ANGIOGRAPHY;  Surgeon: Darron Deatrice LABOR, MD;  [facility];  Service: Cardiovascular;  Laterality: N/A;   LEFT HEART CATH AND CORONARY ANGIOGRAPHY N/A 11/18/2022   Procedure: LEFT HEART CATH AND CORONARY ANGIOGRAPHY;  Surgeon: Anner Alm ORN, MD;  Location: [facility] INVASIVE CV LAB;  Service: Cardiovascular;  Laterality: N/A;   LUMBAR DISC SURGERY     L5-S1   MULTIPLE TOOTH EXTRACTIONS  07/14/2013   Neck Fusion  07/14/1994   C4 to C6   PERCUTANEOUS STENT INTERVENTION Left 06/15/2012   Procedure: PERCUTANEOUS STENT INTERVENTION;  Surgeon: Gaile ORN Serene, MD;  Location: [facility] CATH LAB;  Service: Cardiovascular;  Laterality: Left;  renal   PERIPHERAL VASCULAR BALLOON ANGIOPLASTY  05/26/2017   Procedure: PERIPHERAL VASCULAR BALLOON ANGIOPLASTY;  Surgeon: Serene Gaile ORN, MD;  [facility];  Service: Cardiovascular;;  Lt. Renal Angioplasty   POLYPECTOMY  05/08/2017   Procedure: POLYPECTOMY;  Surgeon: Rivet Claudis RAYMOND, MD;  [facility];  Service: Endoscopy;;  colon    RENAL ANGIOGRAM N/A 06/15/2012   Procedure: RENAL ANGIOGRAM;  Surgeon: Gaile ORN Serene, MD;  Location: [facility] CATH LAB;  Service: Cardiovascular;  Laterality: N/A;   RENAL ANGIOGRAPHY  05/26/2017   Procedure: RENAL ANGIOGRAPHY;  Surgeon: Serene Gaile ORN, MD;  [facility];  Service: Cardiovascular;;   SPINE SURGERY  07/14/1992   Ruptured disc   TEMPORARY PACEMAKER N/A 11/19/2022   Procedure: TEMPORARY PACEMAKER;  Surgeon: Darron Deatrice LABOR, MD;  [facility];  Service: Cardiovascular;  Laterality: N/A;   TONSILECTOMY, ADENOIDECTOMY,  BILATERAL MYRINGOTOMY AND TUBES  07/15/1967   TOTAL KNEE ARTHROPLASTY     Left   TUBAL LIGATION       reports that she quit smoking about 2 years ago. Her smoking use included cigarettes. She started smoking about 47 years ago. She has never used smokeless tobacco. She reports that she does not drink alcohol  and does not use drugs.  Allergies[1]  Family History  Problem Relation Age of Onset   Other Mother        Cerebral hemorrage   Heart disease Mother    Heart attack Mother    Hyperlipidemia Mother    Heart disease Father    Heart attack Father    Hyperlipidemia Father    Heart disease Brother        Heart Disease before age 63   Diabetes Brother    Hypertension Brother    Heart attack Brother    Cancer Sister    Arthritis Sister    Heart disease Sister    Heart disease Daughter 41       Heart Disease before age 29   Heart attack Daughter  Hypertension Daughter    Cancer Sister    Arthritis Sister    Diabetes Brother        Varicose  Veins   Peripheral vascular disease Brother    Diabetes Brother     Prior to Admission medications  Medication Sig Start Date End Date Taking? Authorizing Provider  acetaminophen  (TYLENOL ) 325 MG tablet Take 325 mg by mouth every 6 (six) hours as needed for mild pain (pain score 1-3). 11/11/23   [provider]  albuterol  (VENTOLIN  HFA) 108 (90 Base) MCG/ACT inhaler Inhale 1 puff into the lungs 4 (four) times daily as needed for wheezing or shortness of breath. 04/22/21   [provider]  ALPRAZolam  (XANAX ) 0.5 MG tablet Take 0.5 mg by mouth 2 (two) times daily. 03/30/24   [provider]  aspirin  81 MG EC tablet Take 81 mg by mouth daily.    [provider]  cefdinir  (OMNICEF ) 300 MG capsule Take 1 capsule (300 mg total) by mouth 2 (two) times daily. 04/18/24   Evonnie Lenis, MD  clopidogrel  (PLAVIX ) 75 MG tablet Take 1 tablet (75 mg total) by mouth daily. 12/25/22   Debera Jayson MATSU, MD  cyanocobalamin   1000 MCG tablet Take 1,000 mcg by mouth daily.    [provider]  dapagliflozin  propanediol (FARXIGA ) 10 MG TABS tablet Take 1 tablet (10 mg total) by mouth daily. 10/27/23   Arrien, Mauricio Daniel, MD  doxycycline  (VIBRA -TABS) 100 MG tablet Take 1 tablet (100 mg total) by mouth every 12 (twelve) hours. 04/18/24   Evonnie Lenis, MD  Elastic Bandages & Supports (ABDOMINAL BINDER/ELASTIC MED) MISC 1 each by Does not apply route as directed. 06/22/24   Sheron Lorette GRADE, PA-C  EPINEPHrine 0.3 mg/0.3 mL IJ SOAJ injection Inject 0.3 mg into the muscle as needed for anaphylaxis. 07/13/23   [provider]  escitalopram  (LEXAPRO ) 10 MG tablet Take 10 mg by mouth daily. 03/18/24   [provider]  ferrous sulfate  325 (65 FE) MG tablet Take 1 tablet (325 mg total) by mouth daily with breakfast. 05/10/24   Carlan, Chelsea L, NP  HYDROcodone  bit-homatropine (HYCODAN) 5-1.5 MG/5ML syrup Take 5 mLs by mouth 3 (three) times daily as needed for cough. 04/04/24   [provider]  hydrocortisone  (ANUSOL -HC) 25 MG suppository Place 1 suppository (25 mg total) rectally 2 (two) times daily. 04/18/24   Evonnie Lenis, MD  levothyroxine  (SYNTHROID ) 25 MCG tablet Take 25 mcg by mouth daily before breakfast. 02/16/20   [provider]  melatonin 3 MG TABS tablet Take 3 mg by mouth at bedtime.    [provider]  methocarbamol  (ROBAXIN ) 500 MG tablet Take 1,000 mg by mouth in the morning and at bedtime. 11/23/23   [provider]  midodrine  (PROAMATINE ) 10 MG tablet Take 1 tablet (10 mg total) by mouth 3 (three) times daily with meals. 04/18/24   Evonnie Lenis, MD  Multiple Vitamins-Minerals (PRESERVISION AREDS 2) CAPS Take 2 capsules by mouth daily.    [provider]  nitroGLYCERIN  (NITROSTAT ) 0.4 MG SL tablet Place 1 tablet (0.4 mg total) under the tongue every 5 (five) minutes x 3 doses as needed for chest pain (if no relief after 2nd dose, proceed to ED or call 911).  06/08/23   Debera Jayson MATSU, MD  ondansetron  (ZOFRAN ) 4 MG tablet Take 4 mg by mouth 3 (three) times daily as needed for nausea or vomiting. 06/03/21   [provider]  Allie Gutta Calcium  500 MG TABS  Take 1 tablet by mouth 2 (two) times daily. 09/22/23   [provider]  pantoprazole  (PROTONIX ) 40 MG tablet Take 40 mg by mouth daily. 10/08/22   [provider]  polyethylene glycol powder (GLYCOLAX /MIRALAX ) 17 GM/SCOOP powder Take 17 g by mouth daily. 04/28/24   Roselyn Carlin NOVAK, MD  rosuvastatin  (CRESTOR ) 40 MG tablet Take 1 tablet (40 mg total) by mouth daily. 10/26/23   Arrien, Elidia Sieving, MD  torsemide  (DEMADEX ) 20 MG tablet Take 0.5 tablets (10 mg total) by mouth daily. 06/22/24   Sheron Lorette GRADE, PA-C  traZODone  (DESYREL ) 100 MG tablet Take 100 mg by mouth at bedtime. 03/24/24   [provider]  Vitamin D , Ergocalciferol , (DRISDOL ) 50000 UNITS CAPS Take 50,000 Units by mouth every Monday.     [provider]    Physical Exam: Vitals:   07/07/24 1700 07/07/24 1800 07/07/24 1900 07/07/24 1945  BP: (!) 153/61 (!) 159/61 (!) 117/59 92/76  Pulse: 94 (!) 101 100 (!) 101  Resp: (!) 34 (!) 32 (!) 22 (!) 32  Temp:      TempSrc:      SpO2: 91% 94% 95% 96%  Weight:      Height:        Constitutional: Coughing,  Vitals:   07/07/24 1700 07/07/24 1800 07/07/24 1900 07/07/24 1945  BP: (!) 153/61 (!) 159/61 (!) 117/59 92/76  Pulse: 94 (!) 101 100 (!) 101  Resp: (!) 34 (!) 32 (!) 22 (!) 32  Temp:      TempSrc:      SpO2: 91% 94% 95% 96%  Weight:      Height:       Eyes: PERRL, lids and conjunctivae normal ENMT: Mucous membranes are moist.   Neck: normal, supple, no masses, no thyromegaly Respiratory: Tachypneic, clear to auscultation bilaterally, no wheezing, no crackles. Normal respiratory effort. No accessory muscle use.  Cardiovascular: Tachycardic, regular rate and rhythm, no murmurs / rubs / gallops. No extremity edema.   Extremities warm. Abdomen: no tenderness, no masses palpated. No hepatosplenomegaly.  Musculoskeletal: no clubbing / cyanosis. No joint deformity upper and lower extremities.  Skin: no rashes, lesions, ulcers. No induration Neurologic: No facial asymmetry, moves extremity spontaneously, speech fluent Psychiatric: Normal judgment and insight. Alert and oriented x 3. Normal mood.   Labs on Admission: I have personally reviewed following labs and imaging studies  CBC: Recent Labs  Lab 07/07/24 1550  WBC 9.4  NEUTROABS 6.9  HGB 11.8*  HCT 36.2  MCV 97.3  PLT 188   Basic Metabolic Panel: Recent Labs  Lab 07/07/24 1550  NA 131*  K 4.0  CL 93*  CO2 21*  GLUCOSE 114*  BUN 16  CREATININE 1.13*  CALCIUM  9.3   GFR: Estimated Creatinine Clearance: 35.8 mL/min (A) (by C-G formula based on SCr of 1.13 mg/dL (H)). Liver Function Tests: Recent Labs  Lab 07/07/24 1550  AST 25  ALT 7  ALKPHOS 84  BILITOT 0.5  PROT 8.1  ALBUMIN  4.7   BNP (last 3 results) Recent Labs    04/15/24 1325 04/27/24 2040 07/07/24 1550  PROBNP 5,566.0* 775.0* 3,200.0*   Urine analysis:    Component Value Date/Time   COLORURINE YELLOW 04/27/2024 2040   APPEARANCEUR CLEAR 04/27/2024 2040   APPEARANCEUR Clear 10/14/2021 1419   LABSPEC 1.014 04/27/2024 2040   PHURINE 5.0 04/27/2024 2040   GLUCOSEU 50 (A) 04/27/2024 2040   HGBUR NEGATIVE 04/27/2024 2040   BILIRUBINUR NEGATIVE 04/27/2024 2040   BILIRUBINUR  Negative 10/14/2021 1419   KETONESUR NEGATIVE 04/27/2024 2040   PROTEINUR NEGATIVE 04/27/2024 2040   UROBILINOGEN 0.2 02/06/2014 1816   NITRITE NEGATIVE 04/27/2024 2040   LEUKOCYTESUR NEGATIVE 04/27/2024 2040    Radiological Exams on Admission: DG Chest Port 1 View Result Date: 07/07/2024 CLINICAL DATA:  Cough and shortness of breath. EXAM: PORTABLE CHEST 1 VIEW COMPARISON:  April 27, 2014 FINDINGS: The cardiac silhouette is mildly enlarged and unchanged in size. Mild to moderate  severity diffuse, chronic appearing increased interstitial lung markings are noted. Mild areas of superimposed atelectasis and/or infiltrate are seen within the bilateral lung bases. No pleural effusion or pneumothorax is identified. Postoperative changes are seen throughout the cervical spine with multilevel degenerative changes present throughout the thoracic spine. IMPRESSION: Chronic appearing increased interstitial lung markings with mild bibasilar atelectasis and/or infiltrate. Electronically Signed   By: Suzen Dials M.D.   On: 07/07/2024 16:21   EKG: Independently reviewed.  Sinus tachycardia rate 103, QTc 422.  No significant change from prior.  Assessment/Plan Principal Problem:   Acute on chronic heart failure with preserved ejection fraction (HFpEF) (HCC) Active Problems:   Influenza A   Essential hypertension   Hypothyroidism   Rheumatoid arthritis (HCC)   IPF (idiopathic pulmonary fibrosis) (HCC)   Chronic respiratory failure with hypoxia (HCC)  Assessment and Plan:  Acute on chronic diastolic CHF- weight not significantly changed per chart, no peripheral signs of volume overload at this time, proBNP elevated at 3200.  Portable chest x-ray shows chronic appearing increased interstitial lung markings with mild bibasilar atelectasis and or infiltrate.  Last echo 10/2023 EF of 65 to 70%. - IV Lasix  40 mg x 1 given in ED,further dosing pending clinical course  Influenza infection/pneumonia- chest x-ray findings per above.  Tmax 99.4.  WBC 9.4. - Check procalcitonin - Tamiflu  - IV ceftriaxone  and azithromycin   SIRS-likely secondary to influenza infection.  Tachycardic- heart rate 100-109, tachypnea respiratory 24-41.  Tmax 99.4.  WBC 9.4. -Tamiflu , antibiotics -Blood cultures - VBG - Check lactic acid  Chest pain-described as chest heaviness worse with coughing.  Likely musculoskeletal.  Heart rate 94-109.  EKG unchanged from prior.  Trop 24. - Trend troponin - At this  time doubt thromboembolism, low threshold to obtain CTA chest, pending clinical course  Chronic respiratory failure, idiopathic pulmonary fibrosis-on 2 L at baseline.   Quite tachypneic here, hypoxia not documented, on 6 L with sats 92-96 %.  Rheumatoid arthritis-not on DMARDs.  CKD 3 A- creatinine stable 1.13.  Hypothyroidism -Resume Synthroid   Hypertension blood pressure initially elevated to 200 systolic, now down to 92.  On as needed midodrine  - Further dosing pending clinical course.  CAD history - Resume aspirin , Plavix , statin   DVT prophylaxis: Lovenox  Code Status: Full code Family Communication: None at bedside Disposition Plan: > 2 days Consults called: None Admission status:  Obs tele    Author: Tully FORBES Carwin, MD 07/07/2024 10:31 PM  For on call review [email or web address].      [1]  Allergies Allergen Reactions   Citalopram Other (See Comments) and Palpitations    Heart rate increased   Ciprofloxacin Hcl Diarrhea and Nausea And Vomiting   Dicyclomine  Diarrhea and Other (See Comments)    Made diarrhea worse   Lamotrigine Diarrhea   Metoclopramide Other (See Comments)    Dizziness   Milnacipran Hcl Other (See Comments)    Unknown   Omeprazole Other (See Comments)    Unknown   Pecan Nut (Diagnostic) Other (See Comments)  Unknown reaction, listed on MAR from facility   Pregabalin Other (See Comments)    Unknown   Serotonin Reuptake Inhibitors (Ssris) Other (See Comments)    Unknown    Simvastatin  Other (See Comments)    Increased back and muscle pain   Tape Other (See Comments)    Skin gets red and skin pulls off, paper tape only   Tizanidine Other (See Comments)    Unknown    Trazodone  And Nefazodone Other (See Comments)    Hallucinations    Venlafaxine Other (See Comments)    Affected eyes, blurred vision   Celecoxib Itching   Codeine Itching   Nefazodone Other (See Comments)   Sertraline  Hcl Other (See Comments)    Stomach upset. Pt  takes this med at home 11/2022   "

## 2024-07-08 ENCOUNTER — Observation Stay (HOSPITAL_COMMUNITY)

## 2024-07-08 DIAGNOSIS — Z1152 Encounter for screening for COVID-19: Secondary | ICD-10-CM | POA: Diagnosis not present

## 2024-07-08 DIAGNOSIS — E039 Hypothyroidism, unspecified: Secondary | ICD-10-CM | POA: Diagnosis present

## 2024-07-08 DIAGNOSIS — J84112 Idiopathic pulmonary fibrosis: Secondary | ICD-10-CM | POA: Diagnosis present

## 2024-07-08 DIAGNOSIS — J101 Influenza due to other identified influenza virus with other respiratory manifestations: Secondary | ICD-10-CM | POA: Diagnosis present

## 2024-07-08 DIAGNOSIS — I5033 Acute on chronic diastolic (congestive) heart failure: Secondary | ICD-10-CM

## 2024-07-08 DIAGNOSIS — Z7982 Long term (current) use of aspirin: Secondary | ICD-10-CM | POA: Diagnosis not present

## 2024-07-08 DIAGNOSIS — Z833 Family history of diabetes mellitus: Secondary | ICD-10-CM | POA: Diagnosis not present

## 2024-07-08 DIAGNOSIS — J9621 Acute and chronic respiratory failure with hypoxia: Secondary | ICD-10-CM | POA: Diagnosis present

## 2024-07-08 DIAGNOSIS — E785 Hyperlipidemia, unspecified: Secondary | ICD-10-CM | POA: Diagnosis present

## 2024-07-08 DIAGNOSIS — Z79899 Other long term (current) drug therapy: Secondary | ICD-10-CM | POA: Diagnosis not present

## 2024-07-08 DIAGNOSIS — Z8249 Family history of ischemic heart disease and other diseases of the circulatory system: Secondary | ICD-10-CM | POA: Diagnosis not present

## 2024-07-08 DIAGNOSIS — J9811 Atelectasis: Secondary | ICD-10-CM | POA: Diagnosis present

## 2024-07-08 DIAGNOSIS — Z79890 Hormone replacement therapy (postmenopausal): Secondary | ICD-10-CM | POA: Diagnosis not present

## 2024-07-08 DIAGNOSIS — N1832 Chronic kidney disease, stage 3b: Secondary | ICD-10-CM | POA: Diagnosis present

## 2024-07-08 DIAGNOSIS — D696 Thrombocytopenia, unspecified: Secondary | ICD-10-CM | POA: Diagnosis present

## 2024-07-08 DIAGNOSIS — M069 Rheumatoid arthritis, unspecified: Secondary | ICD-10-CM | POA: Diagnosis present

## 2024-07-08 DIAGNOSIS — E871 Hypo-osmolality and hyponatremia: Secondary | ICD-10-CM | POA: Diagnosis present

## 2024-07-08 DIAGNOSIS — I252 Old myocardial infarction: Secondary | ICD-10-CM | POA: Diagnosis not present

## 2024-07-08 DIAGNOSIS — Z7902 Long term (current) use of antithrombotics/antiplatelets: Secondary | ICD-10-CM | POA: Diagnosis not present

## 2024-07-08 DIAGNOSIS — I2510 Atherosclerotic heart disease of native coronary artery without angina pectoris: Secondary | ICD-10-CM | POA: Diagnosis present

## 2024-07-08 DIAGNOSIS — D631 Anemia in chronic kidney disease: Secondary | ICD-10-CM | POA: Diagnosis present

## 2024-07-08 DIAGNOSIS — I130 Hypertensive heart and chronic kidney disease with heart failure and stage 1 through stage 4 chronic kidney disease, or unspecified chronic kidney disease: Secondary | ICD-10-CM | POA: Diagnosis present

## 2024-07-08 DIAGNOSIS — G2581 Restless legs syndrome: Secondary | ICD-10-CM | POA: Diagnosis present

## 2024-07-08 DIAGNOSIS — M797 Fibromyalgia: Secondary | ICD-10-CM | POA: Diagnosis present

## 2024-07-08 LAB — BASIC METABOLIC PANEL WITH GFR
Anion gap: 9 (ref 5–15)
BUN: 17 mg/dL (ref 8–23)
CO2: 32 mmol/L (ref 22–32)
Calcium: 8.9 mg/dL (ref 8.9–10.3)
Chloride: 92 mmol/L — ABNORMAL LOW (ref 98–111)
Creatinine, Ser: 1.12 mg/dL — ABNORMAL HIGH (ref 0.44–1.00)
GFR, Estimated: 51 mL/min — ABNORMAL LOW
Glucose, Bld: 85 mg/dL (ref 70–99)
Potassium: 4.3 mmol/L (ref 3.5–5.1)
Sodium: 133 mmol/L — ABNORMAL LOW (ref 135–145)

## 2024-07-08 LAB — TROPONIN T, HIGH SENSITIVITY
Troponin T High Sensitivity: 193 ng/L (ref 0–19)
Troponin T High Sensitivity: 154 ng/L (ref 0–19)
Troponin T High Sensitivity: 141 ng/L (ref 0–19)

## 2024-07-08 LAB — CBC
HCT: 32 % — ABNORMAL LOW (ref 36.0–46.0)
Hemoglobin: 10.5 g/dL — ABNORMAL LOW (ref 12.0–15.0)
MCH: 31.7 pg (ref 26.0–34.0)
MCHC: 32.8 g/dL (ref 30.0–36.0)
MCV: 96.7 fL (ref 80.0–100.0)
Platelets: 149 K/uL — ABNORMAL LOW (ref 150–400)
RBC: 3.31 MIL/uL — ABNORMAL LOW (ref 3.87–5.11)
RDW: 14.8 % (ref 11.5–15.5)
WBC: 5.6 K/uL (ref 4.0–10.5)
nRBC: 0 % (ref 0.0–0.2)

## 2024-07-08 LAB — HEPARIN LEVEL (UNFRACTIONATED)
Heparin Unfractionated: 0.55 [IU]/mL (ref 0.30–0.70)
Heparin Unfractionated: 0.45 [IU]/mL (ref 0.30–0.70)

## 2024-07-08 MED ORDER — IPRATROPIUM-ALBUTEROL 0.5-2.5 (3) MG/3ML IN SOLN
3.0000 mL | Freq: Three times a day (TID) | RESPIRATORY_TRACT | Status: AC
  Administered 2024-07-09 – 2024-07-10 (×4): 3 mL via RESPIRATORY_TRACT
  Filled 2024-07-08 (×4): qty 3

## 2024-07-08 MED ORDER — ENOXAPARIN SODIUM 40 MG/0.4ML IJ SOSY: 40.0000 mg | PREFILLED_SYRINGE | INTRAMUSCULAR | Status: DC

## 2024-07-08 MED ORDER — ORAL CARE MOUTH RINSE: 15.0000 mL | OROMUCOSAL | Status: DC | PRN

## 2024-07-08 MED ORDER — METHYLPREDNISOLONE SODIUM SUCC 40 MG IJ SOLR
40.0000 mg | Freq: Two times a day (BID) | INTRAMUSCULAR | Status: DC
  Administered 2024-07-08 – 2024-07-11 (×6): 40 mg via INTRAVENOUS
  Filled 2024-07-08 (×5): qty 1

## 2024-07-08 MED ORDER — IOHEXOL 350 MG/ML SOLN
75.0000 mL | Freq: Once | INTRAVENOUS | Status: AC | PRN
  Administered 2024-07-08: 75 mL via INTRAVENOUS

## 2024-07-08 MED ORDER — DM-GUAIFENESIN ER 30-600 MG PO TB12
1.0000 | ORAL_TABLET | Freq: Two times a day (BID) | ORAL | Status: DC
  Administered 2024-07-08 – 2024-07-11 (×5): 1 via ORAL
  Filled 2024-07-08 (×5): qty 1

## 2024-07-08 MED ORDER — IPRATROPIUM-ALBUTEROL 0.5-2.5 (3) MG/3ML IN SOLN
3.0000 mL | Freq: Four times a day (QID) | RESPIRATORY_TRACT | Status: DC
  Administered 2024-07-08: 3 mL via RESPIRATORY_TRACT
  Filled 2024-07-08 (×2): qty 3

## 2024-07-08 MED ORDER — GUAIFENESIN-DM 100-10 MG/5ML PO SYRP
5.0000 mL | ORAL_SOLUTION | ORAL | Status: DC | PRN
  Administered 2024-07-08 – 2024-07-10 (×3): 5 mL via ORAL
  Filled 2024-07-08 (×3): qty 5

## 2024-07-08 MED ORDER — METOPROLOL TARTRATE 25 MG PO TABS
12.5000 mg | ORAL_TABLET | Freq: Two times a day (BID) | ORAL | Status: DC
  Administered 2024-07-08 – 2024-07-11 (×5): 12.5 mg via ORAL
  Filled 2024-07-08 (×5): qty 1

## 2024-07-08 MED ORDER — HEPARIN (PORCINE) 25000 UT/250ML-% IV SOLN
750.0000 [IU]/h | INTRAVENOUS | Status: DC
  Administered 2024-07-08 – 2024-07-09 (×2): 750 [IU]/h via INTRAVENOUS
  Filled 2024-07-08: qty 250

## 2024-07-08 MED ORDER — HEPARIN (PORCINE) 25000 UT/250ML-% IV SOLN
750.0000 [IU]/h | INTRAVENOUS | Status: DC
  Administered 2024-07-08: 750 [IU]/h via INTRAVENOUS
  Filled 2024-07-08: qty 250

## 2024-07-08 MED ORDER — ASPIRIN 81 MG PO TBEC
81.0000 mg | DELAYED_RELEASE_TABLET | Freq: Every day | ORAL | Status: DC
  Administered 2024-07-09 – 2024-07-11 (×3): 81 mg via ORAL
  Filled 2024-07-08 (×2): qty 1

## 2024-07-08 NOTE — Progress Notes (Signed)
 PHARMACY - ANTICOAGULATION CONSULT NOTE  Pharmacy Consult for Heparin  Indication: chest pain/ACS  Allergies[1]  Patient Measurements: Height: 4' 11 (149.9 cm) Weight: 68.5 kg (151 lb 0.2 oz) IBW/kg (Calculated) : 43.2 HEPARIN  DW (KG): 57.9  Vital Signs: Temp: 99.1 F (37.3 C) (12/26 1946) Temp Source: Oral (12/26 1946) BP: 113/65 (12/26 1946) Pulse Rate: 81 (12/26 1946)  Labs: Recent Labs    07/07/24 1550 07/08/24 0420 07/08/24 0956 07/08/24 1918  HGB 11.8* 10.5*  --   --   HCT 36.2 32.0*  --   --   PLT 188 149*  --   --   HEPARINUNFRC  --   --  0.55 0.45  CREATININE 1.13* 1.12*  --   --     Estimated Creatinine Clearance: 36.5 mL/min (A) (by C-G formula based on SCr of 1.12 mg/dL (H)).   Medical History: Past Medical History:  Diagnosis Date   Abnormal chest CT    Anxiety    Arthritis    Rhumatoid and Osteoarthritis   Carotid artery disease    Dr. Serene, s/p left CEA, severe R ICA stenosis   Coronary atherosclerosis of native coronary artery    DES RCA/circumflex 4/10, LVEF 65 -> repeat cath 11/2021 with multivessel CAD including LM stenosis, initial med rx   Depression    Essential hypertension    Fibromyalgia    GERD (gastroesophageal reflux disease)    Hiatal hernia    Hyperlipidemia    Kidney cysts    Macular degeneration    NSTEMI (non-ST elevated myocardial infarction) (HCC)    4/10   Renal artery stenosis    Left renal artery stent 12/13   Restless leg syndrome    Sleep apnea    Spondylosis without myelopathy    Tobacco abuse     Medications:  Scheduled:   clopidogrel   75 mg Oral Daily   oseltamivir   30 mg Oral BID   rosuvastatin   40 mg Oral Daily    Assessment: 73 y.o. female with with elevated troponin, possible ACS, for heparin .  Date Time HL Rate/Comment  12/26 0956 0.55 Therapeutic x1 @ 750 units/hr  12/26 1918 0.45 Therapeutic x2  Goal of Therapy:  Heparin  level 0.3-0.7 units/ml Monitor platelets by anticoagulation  protocol: Yes   Plan:  - HL therapeutic x2 consecutive levels - Continue heparin  at 750 units/hr - Check heparin  level daily with AM labs   [name], PharmD Clinical Pharmacist 07/08/2024 7:53 PM       [1]  Allergies Allergen Reactions   Citalopram Other (See Comments) and Palpitations    Heart rate increased   Ciprofloxacin Hcl Diarrhea and Nausea And Vomiting   Dicyclomine  Diarrhea and Other (See Comments)    Made diarrhea worse   Lamotrigine Diarrhea   Metoclopramide Other (See Comments)    Dizziness   Milnacipran Hcl Other (See Comments)    Unknown   Omeprazole Other (See Comments)    Unknown   Pecan Nut (Diagnostic) Other (See Comments)    Unknown reaction, listed on MAR from facility   Pregabalin Other (See Comments)    Unknown   Serotonin Reuptake Inhibitors (Ssris) Other (See Comments)    Unknown    Simvastatin  Other (See Comments)    Increased back and muscle pain   Tape Other (See Comments)    Skin gets red and skin pulls off, paper tape only   Tizanidine Other (See Comments)    Unknown    Trazodone  And Nefazodone Other (See Comments)  Hallucinations    Venlafaxine Other (See Comments)    Affected eyes, blurred vision   Celecoxib Itching   Codeine Itching   Nefazodone Other (See Comments)   Sertraline  Hcl Other (See Comments)    Stomach upset. Pt takes this med at home 11/2022

## 2024-07-08 NOTE — Significant Event (Signed)
"  ° °      CROSS COVER NOTE  NAME: [name] MRN: [id number] DOB : 1950-12-07 ATTENDING PHYSICIAN: Pearlean Tully BRAVO, MD    Date of Service   07/08/2024   HPI/Events of Note   TRH cross cover for [facility] received from nursing troponins continue to increase   HPI: [name] is a 73 y.o. female with medical history significant for coronary artery disease, diastolic CHF, CKD 3B, hypertension, hypothyroidism, rheumatoid arthritis, chronic respiratory failure on 2 L Patient presented to ED with complaints of cough and difficulty breathing that started last night with congestion.   Admitted and treatment for acute on chronic heart failure and flu - started antibiotics for PNA, pr cal notable 0.16 Troponin trend 24, 167 193  Interventions   Assessment/Plan: Vitals:   07/07/24 2023 07/07/24 2100 07/07/24 2114 07/08/24 0028  BP: 137/63 137/63  92/64  Pulse: 100 100  89  Temp: 97.8 F (36.6 C) 97.8 F (36.6 C)  98.9 F (37.2 C)  Resp: 17 17  20   Height:      Weight:      SpO2: 100% 100% 100% 100%  TempSrc: Oral Oral  Oral  BMI (Calculated):        Prevalence of PE is substantially higher in older adults in their 41s when compared to gen population of same age  CTA chest PE  IV heparin  per pharmacy consult D/c antibiotics Cardiology consult in am X X       Erminio LITTIE Cone NP [facility] Cross Cover 7pm-7am - check amion for availability Pager [phone]  "

## 2024-07-08 NOTE — Progress Notes (Signed)
 " PROGRESS NOTE  [name], is a 73 y.o. female, DOB - 18-Dec-1950, FMW:[id number]  Admit date - 07/07/2024   Admitting Physician Ejiroghene FORBES Carwin, MD  Outpatient Primary MD for the patient is [name], MD  LOS - 0  Chief Complaint  Patient presents with   Shortness of Breath   Chest Pain      Brief Narrative:  73 y.o. female with medical history significant for coronary artery disease, diastolic CHF, CKD 3B, hypertension, hypothyroidism, rheumatoid arthritis, chronic respiratory failure on 2 L admitted on 07/07/2024 with acute on chronic hypoxic respiratory failure in the setting of influenza A infection with superimposed bronchitis    -Assessment and Plan: 1)Influenza A infection with Bronchitis -RSV and COVID-negative -PCT 0.16 - CTA chest without PE, however showed evidence of bronchitis/reactive airway -- Continue Tamiflu  - Add Solu-Medrol  judiciously especially due to concerns about #3 below - Also add mucolytics and bronchodilators  2)Acute on chronic hypoxic respiratory failure due to #1 above -Patient at baseline has chronic hypoxic respiratory failure due to idiopathic pulmonary fibrosis - Currently requiring up to 6 L of oxygen typically at baseline she uses 2 L of oxygen via nasal cannula - Anticipate improvement with #1 above  3) atypical chest pain/elevated Troponin--suspect demand ischemia due to #1 and #2 above---Troponin 24 >> 167 >>193>>154 >>141 -Chest pains mostly related to coughing in the setting of influenza -EKG normal sinus rhythm with LVH - Okay to give IV heparin  for 48 hours - Continue Plavix , aspirin , Crestor  okay to use low-dose metoprolol  if BP tolerates  4) hypothyroidism--- continue levothyroxine   5)CKD stage - 3A - Creatinine is currently actually lower than recent baseline -renally adjust medications, avoid nephrotoxic agents / dehydration  / hypotension  6) chronic anemia of CKD--- Hgb currently around 10 which is close to  baseline - No acute bleeding concerns - Mild thrombocytopenia noted with platelets of 149---- watch closely while on IV heparin   Status is: Inpatient   Disposition: The patient is from: Home              Anticipated d/c is to: Home              Anticipated d/c date is: 2 days              Patient currently is not medically stable to d/c. Barriers: Not Clinically Stable-   Code Status :  -  Code Status: Full Code   Family Communication:   NA (patient is alert, awake and coherent)   DVT Prophylaxis  :   - SCDs   enoxaparin  (LOVENOX ) injection 40 mg Start: 07/09/24 1000   Lab Results  Component Value Date   PLT 149 (L) 07/08/2024    Inpatient Medications  Scheduled Meds:  clopidogrel   75 mg Oral Daily   [START ON 07/09/2024] enoxaparin  (LOVENOX ) injection  40 mg Subcutaneous Q24H   oseltamivir   30 mg Oral BID   rosuvastatin   40 mg Oral Daily   Continuous Infusions:  promethazine  (PHENERGAN ) injection (IM or IVPB) 12.5 mg (07/07/24 2244)   PRN Meds:.acetaminophen  **OR** acetaminophen , albuterol , mouth rinse, oxyCODONE -acetaminophen , promethazine  (PHENERGAN ) injection (IM or IVPB)   Anti-infectives (From admission, onward)    Start     Dose/Rate Route Frequency Ordered Stop   07/08/24 1700  cefTRIAXone  (ROCEPHIN ) 2 g in sodium chloride  0.9 % 100 mL IVPB  Status:  Discontinued        2 g 200 mL/hr over 30 Minutes Intravenous Every 24 hours  07/07/24 2117 07/08/24 0505   07/08/24 1700  azithromycin  (ZITHROMAX ) 500 mg in sodium chloride  0.9 % 250 mL IVPB  Status:  Discontinued        500 mg 250 mL/hr over 60 Minutes Intravenous Every 24 hours 07/07/24 2117 07/08/24 0505   07/08/24 1000  oseltamivir  (TAMIFLU ) capsule 30 mg        30 mg Oral 2 times daily 07/07/24 2024 07/13/24 0959   07/07/24 1730  azithromycin  (ZITHROMAX ) 500 mg in sodium chloride  0.9 % 250 mL IVPB        500 mg 250 mL/hr over 60 Minutes Intravenous  Once 07/07/24 1715 07/08/24 0705   07/07/24 1715   oseltamivir  (TAMIFLU ) capsule 75 mg  Status:  Discontinued        75 mg Oral 2 times daily 07/07/24 1709 07/07/24 2024   07/07/24 1715  cefTRIAXone  (ROCEPHIN ) 2 g in sodium chloride  0.9 % 100 mL IVPB        2 g 200 mL/hr over 30 Minutes Intravenous  Once 07/07/24 1709 07/07/24 1816      Subjective: Foy Standing today has no fevers, no emesis,  No further chest pain,    - Cough dyspnea and hypoxia persist   Objective: Vitals:   07/08/24 0500 07/08/24 0525 07/08/24 1355 07/08/24 1946  BP:  (!) 96/40 (!) 116/42 113/65  Pulse:  86 84 81  Resp:  18 19 17   Temp:  99.6 F (37.6 C) 98.8 F (37.1 C) 99.1 F (37.3 C)  TempSrc:  Oral Oral Oral  SpO2:  100% 99% 100%  Weight: 68.5 kg     Height:        Intake/Output Summary (Last 24 hours) at 07/08/2024 1957 Last data filed at 07/08/2024 1507 Gross per 24 hour  Intake 548.23 ml  Output 400 ml  Net 148.23 ml   Filed Weights   07/07/24 1556 07/08/24 0500  Weight: 67 kg 68.5 kg    Physical Exam  Gen:- Awake Alert, no acute distress HEENT:- [place].AT, No sclera icterus Nose- [place] 6L/min Neck-Supple Neck,No JVD,.  Lungs-diminished breath sounds with scattered wheezes  CV- S1, S2 normal, regular  Abd-  +ve B.Sounds, Abd Soft, No tenderness,    Extremity/Skin:- No  edema, pedal pulses present  Psych-affect is appropriate, oriented x3 Neuro-generalized weakness, no new focal deficits, no tremors  Data Reviewed: I have personally reviewed following labs and imaging studies  CBC: Recent Labs  Lab 07/07/24 1550 07/08/24 0420  WBC 9.4 5.6  NEUTROABS 6.9  --   HGB 11.8* 10.5*  HCT 36.2 32.0*  MCV 97.3 96.7  PLT 188 149*   Basic Metabolic Panel: Recent Labs  Lab 07/07/24 1550 07/08/24 0420  NA 131* 133*  K 4.0 4.3  CL 93* 92*  CO2 21* 32  GLUCOSE 114* 85  BUN 16 17  CREATININE 1.13* 1.12*  CALCIUM  9.3 8.9   GFR: Estimated Creatinine Clearance: 36.5 mL/min (A) (by C-G formula based on SCr of 1.12 mg/dL (H)). Liver  Function Tests: Recent Labs  Lab 07/07/24 1550  AST 25  ALT 7  ALKPHOS 84  BILITOT 0.5  PROT 8.1  ALBUMIN  4.7   BNP (last 3 results) Recent Labs    04/15/24 1325 04/27/24 2040 07/07/24 1550  PROBNP 5,566.0* 775.0* 3,200.0*   Recent Results (from the past 240 hours)  Resp panel by RT-PCR (RSV, Flu A&B, Covid) Anterior Nasal Swab     Status: Abnormal   Collection Time: 07/07/24  3:51 PM  Specimen: Anterior Nasal Swab  Result Value Ref Range Status   SARS Coronavirus 2 by RT PCR NEGATIVE NEGATIVE Final    Comment: (NOTE) SARS-CoV-2 target nucleic acids are NOT DETECTED.  The SARS-CoV-2 RNA is generally detectable in upper respiratory specimens during the acute phase of infection. The lowest concentration of SARS-CoV-2 viral copies this assay can detect is 138 copies/mL. A negative result does not preclude SARS-Cov-2 infection and should not be used as the sole basis for treatment or other patient management decisions. A negative result may occur with  improper specimen collection/handling, submission of specimen other than nasopharyngeal swab, presence of viral mutation(s) within the areas targeted by this assay, and inadequate number of viral copies(<138 copies/mL). A negative result must be combined with clinical observations, patient history, and epidemiological information. The expected result is Negative.  Fact Sheet for Patients:  bloggercourse.com  Fact Sheet for Healthcare Providers:  seriousbroker.it  This test is no t yet approved or cleared by the [place]  FDA and  has been authorized for detection and/or diagnosis of SARS-CoV-2 by FDA under an Emergency Use Authorization (EUA). This EUA will remain  in effect (meaning this test can be used) for the duration of the COVID-19 declaration under Section 564(b)(1) of the Act, 21 U.S.C.section 360bbb-3(b)(1), unless the authorization is terminated  or  revoked sooner.       Influenza A by PCR POSITIVE (A) NEGATIVE Final   Influenza B by PCR NEGATIVE NEGATIVE Final    Comment: (NOTE) The Xpert Xpress SARS-CoV-2/FLU/RSV plus assay is intended as an aid in the diagnosis of influenza from Nasopharyngeal swab specimens and should not be used as a sole basis for treatment. Nasal washings and aspirates are unacceptable for Xpert Xpress SARS-CoV-2/FLU/RSV testing.  Fact Sheet for Patients: bloggercourse.com  Fact Sheet for Healthcare Providers: seriousbroker.it  This test is not yet approved or cleared by the [place]  FDA and has been authorized for detection and/or diagnosis of SARS-CoV-2 by FDA under an Emergency Use Authorization (EUA). This EUA will remain in effect (meaning this test can be used) for the duration of the COVID-19 declaration under Section 564(b)(1) of the Act, 21 U.S.C. section 360bbb-3(b)(1), unless the authorization is terminated or revoked.     Resp Syncytial Virus by PCR NEGATIVE NEGATIVE Final    Comment: (NOTE) Fact Sheet for Patients: bloggercourse.com  Fact Sheet for Healthcare Providers: seriousbroker.it  This test is not yet approved or cleared by the [place]  FDA and has been authorized for detection and/or diagnosis of SARS-CoV-2 by FDA under an Emergency Use Authorization (EUA). This EUA will remain in effect (meaning this test can be used) for the duration of the COVID-19 declaration under Section 564(b)(1) of the Act, 21 U.S.C. section 360bbb-3(b)(1), unless the authorization is terminated or revoked.  Performed at [facility], [address] [place], [place] [address]   Culture, blood (Routine X 2) w Reflex to ID Panel     Status: None (Preliminary result)   Collection Time: 07/07/24 10:08 PM   Specimen: BLOOD  Result Value Ref Range Status   Specimen Description BLOOD BLOOD LEFT  ARM  Final   Special Requests   Final    BOTTLES DRAWN AEROBIC AND ANAEROBIC Blood Culture adequate volume   Culture   Final    NO GROWTH < 12 HOURS Performed at [facility], [address] [place], [place] [address]    Report Status PENDING  Incomplete  Culture, blood (Routine X 2) w Reflex to ID  Panel     Status: None (Preliminary result)   Collection Time: 07/07/24 10:08 PM   Specimen: BLOOD  Result Value Ref Range Status   Specimen Description BLOOD BLOOD LEFT HAND  Final   Special Requests   Final    BOTTLES DRAWN AEROBIC AND ANAEROBIC Blood Culture adequate volume   Culture   Final    NO GROWTH < 12 HOURS Performed at [facility], [address], [place], [place] [address]    Report Status PENDING  Incomplete     Radiology Studies: CT Angio Chest Pulmonary Embolism (PE) W or WO Contrast Result Date: 07/08/2024 EXAM: CTA CHEST 07/08/2024 02:38:59 AM TECHNIQUE: CTA of the chest was performed with the administration of 75 mL of iohexol  (OMNIPAQUE ) 350 MG/ML intravenous contrast. Multiplanar reformatted images are provided for review. MIP images are provided for review. Automated exposure control, iterative reconstruction, and/or weight based adjustment of the mA/kV was utilized to reduce the radiation dose to as low as reasonably achievable. COMPARISON: None available. CLINICAL HISTORY: Pulmonary embolism (PE) suspected, high prob. FINDINGS: PULMONARY ARTERIES: Pulmonary arteries are adequately opacified for evaluation. Negative for pulmonary embolism. Evaluation of the segmental and subsegmental pulmonary arteries is limited due to respiratory motion artifact. Main pulmonary artery is normal in caliber. MEDIASTINUM: The heart and pericardium demonstrate no acute abnormality. Aortic and coronary artery atherosclerotic calcification. There is no acute abnormality of the thoracic aorta. LYMPH NODES: No mediastinal, hilar or axillary lymphadenopathy. LUNGS AND PLEURA: Respiratory motion  obscures detail on the lungs. Clustered nodularity in the right upper lobe on series 4 image 33 is likely due to small airway infection / inflammation. Debris in the trachea. Bronchial wall thickening and mucus plugging. Trace right pleural effusion. No pneumothorax. UPPER ABDOMEN: Limited images of the upper abdomen are unremarkable. SOFT TISSUES AND BONES: No acute bone or soft tissue abnormality. IMPRESSION: 1. No pulmonary embolism; limited evaluation of the segmental and subsegmental pulmonary arteries secondary to respiratory motion artifact. 2. Constellation of pulmonary findings suggest bronchitis / reactive airways. Limited evaluation of the lungs due to motion artifact. Electronically signed by: Norman Gatlin MD 07/08/2024 02:50 AM EST RP Workstation: [id number]   DG Chest Port 1 View Result Date: 07/07/2024 CLINICAL DATA:  Cough and shortness of breath. EXAM: PORTABLE CHEST 1 VIEW COMPARISON:  April 27, 2014 FINDINGS: The cardiac silhouette is mildly enlarged and unchanged in size. Mild to moderate severity diffuse, chronic appearing increased interstitial lung markings are noted. Mild areas of superimposed atelectasis and/or infiltrate are seen within the bilateral lung bases. No pleural effusion or pneumothorax is identified. Postoperative changes are seen throughout the cervical spine with multilevel degenerative changes present throughout the thoracic spine. IMPRESSION: Chronic appearing increased interstitial lung markings with mild bibasilar atelectasis and/or infiltrate. Electronically Signed   By: Suzen Dials M.D.   On: 07/07/2024 16:21   Scheduled Meds:  clopidogrel   75 mg Oral Daily   [START ON 07/09/2024] enoxaparin  (LOVENOX ) injection  40 mg Subcutaneous Q24H   oseltamivir   30 mg Oral BID   rosuvastatin   40 mg Oral Daily   Continuous Infusions:  promethazine  (PHENERGAN ) injection (IM or IVPB) 12.5 mg (07/07/24 2244)    LOS: 0 days   Rendall Carwin M.D on 07/08/2024  at 7:57 PM  Go to [email or web address] - for contact info  [facility] - Office  [phone]  If 7PM-7AM, please contact night-coverage [email or web address] 07/08/2024, 7:57 PM    "

## 2024-07-08 NOTE — Progress Notes (Signed)
 PHARMACY - ANTICOAGULATION CONSULT NOTE  Pharmacy Consult for Heparin  Indication: chest pain/ACS  Allergies[1]  Patient Measurements: Height: 4' 11 (149.9 cm) Weight: 67 kg (147 lb 11.3 oz) IBW/kg (Calculated) : 43.2 HEPARIN  DW (KG): 57.9  Vital Signs: Temp: 98.9 F (37.2 C) (12/26 0028) Temp Source: Oral (12/26 0028) BP: 92/64 (12/26 0028) Pulse Rate: 89 (12/26 0028)  Labs: Recent Labs    07/07/24 1550  HGB 11.8*  HCT 36.2  PLT 188  CREATININE 1.13*    Estimated Creatinine Clearance: 35.8 mL/min (A) (by C-G formula based on SCr of 1.13 mg/dL (H)).   Medical History: Past Medical History:  Diagnosis Date   Abnormal chest CT    Anxiety    Arthritis    Rhumatoid and Osteoarthritis   Carotid artery disease    Dr. Serene, s/p left CEA, severe R ICA stenosis   Coronary atherosclerosis of native coronary artery    DES RCA/circumflex 4/10, LVEF 65 -> repeat cath 11/2021 with multivessel CAD including LM stenosis, initial med rx   Depression    Essential hypertension    Fibromyalgia    GERD (gastroesophageal reflux disease)    Hiatal hernia    Hyperlipidemia    Kidney cysts    Macular degeneration    NSTEMI (non-ST elevated myocardial infarction) (HCC)    4/10   Renal artery stenosis    Left renal artery stent 12/13   Restless leg syndrome    Sleep apnea    Spondylosis without myelopathy    Tobacco abuse     Medications:  Scheduled:   clopidogrel   75 mg Oral Daily   enoxaparin  (LOVENOX ) injection  40 mg Subcutaneous Q24H   guaiFENesin -dextromethorphan   15 mL Oral Q8H   oseltamivir   30 mg Oral BID   rosuvastatin   40 mg Oral Daily    Assessment: 73 y.o. female with with elevated troponin, possible ACS, for heparin   Goal of Therapy:  Heparin  level 0.3-0.7 units/ml Monitor platelets by anticoagulation protocol: Yes   Plan:  D/C Lovenox  Start heparin  750 units/hr Check heparin  level in 8 hours.   Rhea Thrun, Cordella Misty 07/08/2024,1:24 AM       [1]  Allergies Allergen Reactions   Citalopram Other (See Comments) and Palpitations    Heart rate increased   Ciprofloxacin Hcl Diarrhea and Nausea And Vomiting   Dicyclomine  Diarrhea and Other (See Comments)    Made diarrhea worse   Lamotrigine Diarrhea   Metoclopramide Other (See Comments)    Dizziness   Milnacipran Hcl Other (See Comments)    Unknown   Omeprazole Other (See Comments)    Unknown   Pecan Nut (Diagnostic) Other (See Comments)    Unknown reaction, listed on MAR from facility   Pregabalin Other (See Comments)    Unknown   Serotonin Reuptake Inhibitors (Ssris) Other (See Comments)    Unknown    Simvastatin  Other (See Comments)    Increased back and muscle pain   Tape Other (See Comments)    Skin gets red and skin pulls off, paper tape only   Tizanidine Other (See Comments)    Unknown    Trazodone  And Nefazodone Other (See Comments)    Hallucinations    Venlafaxine Other (See Comments)    Affected eyes, blurred vision   Celecoxib Itching   Codeine Itching   Nefazodone Other (See Comments)   Sertraline  Hcl Other (See Comments)    Stomach upset. Pt takes this med at home 11/2022

## 2024-07-08 NOTE — TOC Initial Note (Signed)
 Transition of Care [facility]) - Initial/Assessment Note    Patient Details  Name: [name] MRN: [id number] Date of Birth: [birth year]  Transition of [facility]) CM/SW Contact:    Sharlyne Stabs, RN Phone Number: 07/08/2024, 2:27 PM  Clinical Narrative:       Admitted in OBS with Flu. Recently admitted and assessed. Patient is from [facility].  Discharged back with  Home O2 arranged through [facility]. Patient uses a cane due to knee pain, but on most days she is independent. FL2 started. IPCM following.    Expected Discharge Plan: Assisted Living Barriers to Discharge: Continued Medical Work up   Patient Goals and CMS Choice Patient states their goals for this hospitalization and ongoing recovery are:: Return to ALF CMS Medicare.gov Compare Post Acute Care list provided to:: Patient Choice offered to / list presented to : Patient [place] ownership interest in [facility].provided to:: Patient    Expected Discharge Plan and Services          Prior Living Arrangements/Services   Lives with:: Facility Resident Patient language and need for interpreter reviewed:: Yes        Need for Family Participation in Patient Care: Yes (Comment) Care giver support system in place?: Yes (comment) Current home services: DME Criminal Activity/Legal Involvement Pertinent to Current Situation/Hospitalization: No - Comment as needed  Activities of Daily Living   ADL Screening (condition at time of admission) Independently performs ADLs?: Yes (appropriate for developmental age) Is the patient deaf or have difficulty hearing?: No Does the patient have difficulty seeing, even when wearing glasses/contacts?: No Does the patient have difficulty concentrating, remembering, or making decisions?: No  Permission Sought/Granted                  Emotional Assessment     Affect (typically observed): Accepting Orientation: : Oriented to Self, Oriented to Situation,  Oriented to Place Alcohol  / Substance Use: Not Applicable Psych Involvement: No (comment)  Admission diagnosis:  Hyponatremia [E87.1] Influenza A [J10.1] Renal insufficiency [N28.9] Acute respiratory failure with hypoxia (HCC) [J96.01] Normochromic normocytic anemia [D64.9] Elevated random blood glucose level [R73.9] Community acquired pneumonia, unspecified laterality [J18.9] Acute on chronic hypoxic respiratory failure (HCC) [J96.21] Acute exacerbation of chronic heart failure (HCC) [I50.9] Patient Active Problem List   Diagnosis Date Noted   Acute on chronic hypoxic respiratory failure (HCC) 07/08/2024   Influenza A 07/07/2024   Chronic respiratory failure with hypoxia (HCC) 07/07/2024   Iron deficiency anemia 05/10/2024   Rectal bleeding 05/03/2024   Esophageal dysphagia 05/03/2024   Acute on chronic heart failure with preserved ejection fraction (HFpEF) (HCC) 04/16/2024   Acute respiratory failure with hypoxia (HCC) 04/15/2024   Lobar pneumonia 04/15/2024   CAP (community acquired pneumonia) 04/11/2024   Acute renal failure superimposed on stage 3b chronic kidney disease, unspecified acute renal failure type (HCC) 11/25/2023   Acute kidney injury superimposed on CKD 11/24/2023   Chronic diastolic HF (heart failure) (HCC) 10/23/2023   Acute hypoxemic respiratory failure (HCC) 10/20/2023   Status post carotid endarterectomy 07/03/2023   Symptomatic carotid artery stenosis, right 07/03/2023   Anxiety state 06/10/2023   Relational problem 06/10/2023   Acute coronary syndrome with high troponin (HCC) 11/20/2022   Chronic kidney disease stage IIIb 11/18/2022   Obesity (BMI 30-39.9) 11/18/2022   Rheumatoid arthritis (HCC) 11/17/2022   Fibromyalgia 11/17/2022   Hypothyroidism 11/17/2022   Progressive angina with severe left main stenosis 11/16/2022   IPF (idiopathic pulmonary fibrosis) (HCC) 02/25/2022  Abdominal pain, epigastric 11/26/2021   Nausea without vomiting 11/26/2021    Constipation 11/26/2021   Tobacco abuse 11/16/2021   Abnormal chest CT 11/16/2021   Abnormal nuclear cardiac imaging test    Mixed incontinence 07/16/2021   History of UTI 07/16/2021   AMS (altered mental status) 03/11/2020   AKI (acute kidney injury) 03/11/2020   Hypokalemia 03/11/2020   Altered mental status 03/11/2020   Depression    Prolonged QT interval    Unilateral primary osteoarthritis, right knee 02/22/2020   Gastroesophageal reflux disease without esophagitis 01/25/2018   Encounter for colonoscopy due to history of adenomatous colonic polyps 03/23/2017   Melena 01/31/2014   Aftercare following surgery of the circulatory system, NEC 01/30/2014   Tooth pain 05/03/2013   GERD (gastroesophageal reflux disease) 02/16/2013   Bloating 02/16/2013   Renal artery stenosis 11/04/2012   PAD (peripheral artery disease) 05/07/2012   Shortness of breath 04/14/2012   Carotid artery stenosis 11/13/2009   Hyperlipidemia 04/19/2009   Essential hypertension 03/12/2009   CAD (coronary artery disease) 03/12/2009   PCP:  [name], MD Pharmacy:   [facility] [place], [place] - [address] [place] [address] Phone: [phone] Fax: [phone]  [facility] [place], [place] - [address]  [place] [address] Phone: [phone] Fax: [phone]     Social Drivers of Health (SDOH) Social History: SDOH Screenings   Food Insecurity: No Food Insecurity (07/07/2024)  Housing: Low Risk (07/07/2024)  Transportation Needs: No Transportation Needs (07/07/2024)  Utilities: Not At Risk (07/07/2024)  Financial Resource Strain: Medium Risk (07/31/2023)   [facility] from [facility]  Social Connections: Moderately Isolated (07/07/2024)  Tobacco Use: Medium Risk (07/07/2024)  Health Literacy: High Risk (07/31/2023)   [facility] from [facility]   SDOH Interventions:     Readmission Risk Interventions     04/12/2024   11:48 AM 11/27/2023    7:51 AM 10/20/2023    8:13 PM  Readmission Risk Prevention Plan  Transportation Screening Complete Complete Complete  PCP or Specialist Appt within 3-5 Days Not Complete  Complete  HRI or Home Care Consult Complete  Complete  Social Work Consult for Recovery Care Planning/Counseling Complete  Complete  Palliative Care Screening Not Applicable  Not Applicable  Medication Review Oceanographer) Complete Complete Complete  HRI or Home Care Consult  Complete   SW Recovery Care/Counseling Consult  Complete   Palliative Care Screening  Not Applicable   Skilled Nursing Facility  Not Applicable

## 2024-07-08 NOTE — Progress Notes (Signed)
 PHARMACY - ANTICOAGULATION CONSULT NOTE  Pharmacy Consult for Heparin  Indication: chest pain/ACS  Allergies[1]  Patient Measurements: Height: 4' 11 (149.9 cm) Weight: 68.5 kg (151 lb 0.2 oz) IBW/kg (Calculated) : 43.2 HEPARIN  DW (KG): 57.9  Vital Signs: Temp: 99.6 F (37.6 C) (12/26 0525) Temp Source: Oral (12/26 0525) BP: 96/40 (12/26 0525) Pulse Rate: 86 (12/26 0525)  Labs: Recent Labs    07/07/24 1550 07/08/24 0420 07/08/24 0956  HGB 11.8* 10.5*  --   HCT 36.2 32.0*  --   PLT 188 149*  --   HEPARINUNFRC  --   --  0.55  CREATININE 1.13* 1.12*  --     Estimated Creatinine Clearance: 36.5 mL/min (A) (by C-G formula based on SCr of 1.12 mg/dL (H)).   Medical History: Past Medical History:  Diagnosis Date   Abnormal chest CT    Anxiety    Arthritis    Rhumatoid and Osteoarthritis   Carotid artery disease    Dr. Serene, s/p left CEA, severe R ICA stenosis   Coronary atherosclerosis of native coronary artery    DES RCA/circumflex 4/10, LVEF 65 -> repeat cath 11/2021 with multivessel CAD including LM stenosis, initial med rx   Depression    Essential hypertension    Fibromyalgia    GERD (gastroesophageal reflux disease)    Hiatal hernia    Hyperlipidemia    Kidney cysts    Macular degeneration    NSTEMI (non-ST elevated myocardial infarction) (HCC)    4/10   Renal artery stenosis    Left renal artery stent 12/13   Restless leg syndrome    Sleep apnea    Spondylosis without myelopathy    Tobacco abuse     Medications:  Scheduled:   clopidogrel   75 mg Oral Daily   guaiFENesin -dextromethorphan   15 mL Oral Q8H   oseltamivir   30 mg Oral BID   rosuvastatin   40 mg Oral Daily    Assessment: 73 y.o. female with with elevated troponin, possible ACS, for heparin .  Initial heparin  level is within goal at 0.55 on heparin  at 750 units/hr. No bleeding or IV issues noted. Will check confirmatory level tonight.   Goal of Therapy:  Heparin  level 0.3-0.7  units/ml Monitor platelets by anticoagulation protocol: Yes   Plan:  Continue heparin  at 750 units/hr Check heparin  level in 8 hours.   Dempsey Blush PharmD., BCPS Clinical Pharmacist 07/08/2024 11:27 AM     [1]  Allergies Allergen Reactions   Citalopram Other (See Comments) and Palpitations    Heart rate increased   Ciprofloxacin Hcl Diarrhea and Nausea And Vomiting   Dicyclomine  Diarrhea and Other (See Comments)    Made diarrhea worse   Lamotrigine Diarrhea   Metoclopramide Other (See Comments)    Dizziness   Milnacipran Hcl Other (See Comments)    Unknown   Omeprazole Other (See Comments)    Unknown   Pecan Nut (Diagnostic) Other (See Comments)    Unknown reaction, listed on MAR from facility   Pregabalin Other (See Comments)    Unknown   Serotonin Reuptake Inhibitors (Ssris) Other (See Comments)    Unknown    Simvastatin  Other (See Comments)    Increased back and muscle pain   Tape Other (See Comments)    Skin gets red and skin pulls off, paper tape only   Tizanidine Other (See Comments)    Unknown    Trazodone  And Nefazodone Other (See Comments)    Hallucinations    Venlafaxine Other (See Comments)  Affected eyes, blurred vision   Celecoxib Itching   Codeine Itching   Nefazodone Other (See Comments)   Sertraline  Hcl Other (See Comments)    Stomach upset. Pt takes this med at home 11/2022

## 2024-07-08 NOTE — Plan of Care (Signed)
   Problem: Education: Goal: Knowledge of General Education information will improve Description: Including pain rating scale, medication(s)/side effects and non-pharmacologic comfort measures Outcome: Progressing   Problem: Clinical Measurements: Goal: Will remain free from infection Outcome: Progressing Goal: Diagnostic test results will improve Outcome: Progressing

## 2024-07-09 DIAGNOSIS — I5033 Acute on chronic diastolic (congestive) heart failure: Secondary | ICD-10-CM | POA: Diagnosis not present

## 2024-07-09 LAB — HEPARIN LEVEL (UNFRACTIONATED): Heparin Unfractionated: 0.5 [IU]/mL (ref 0.30–0.70)

## 2024-07-09 LAB — CBC
HCT: 30.7 % — ABNORMAL LOW (ref 36.0–46.0)
Hemoglobin: 10.4 g/dL — ABNORMAL LOW (ref 12.0–15.0)
MCH: 32 pg (ref 26.0–34.0)
MCHC: 33.9 g/dL (ref 30.0–36.0)
MCV: 94.5 fL (ref 80.0–100.0)
Platelets: 139 K/uL — ABNORMAL LOW (ref 150–400)
RBC: 3.25 MIL/uL — ABNORMAL LOW (ref 3.87–5.11)
RDW: 14.7 % (ref 11.5–15.5)
WBC: 2 K/uL — ABNORMAL LOW (ref 4.0–10.5)
nRBC: 0 % (ref 0.0–0.2)

## 2024-07-09 MED ORDER — ALPRAZOLAM 0.5 MG PO TABS
0.5000 mg | ORAL_TABLET | Freq: Two times a day (BID) | ORAL | Status: DC
  Administered 2024-07-09 – 2024-07-11 (×5): 0.5 mg via ORAL
  Filled 2024-07-09 (×4): qty 1

## 2024-07-09 MED ORDER — MIDODRINE HCL 5 MG PO TABS
10.0000 mg | ORAL_TABLET | Freq: Three times a day (TID) | ORAL | Status: DC
  Administered 2024-07-10 – 2024-07-11 (×5): 10 mg via ORAL
  Filled 2024-07-09 (×3): qty 2

## 2024-07-09 MED ORDER — LEVOTHYROXINE SODIUM 25 MCG PO TABS
25.0000 ug | ORAL_TABLET | Freq: Every day | ORAL | Status: DC
  Administered 2024-07-10 – 2024-07-11 (×2): 25 ug via ORAL
  Filled 2024-07-09: qty 1

## 2024-07-09 NOTE — Progress Notes (Signed)
 Pt had nitroglcyerin patch to center chest. This was removed by this RN and BP was checked. BP is normal. Will inform MD and hold midodrine  for now. Kellogg RN

## 2024-07-09 NOTE — Plan of Care (Signed)

## 2024-07-09 NOTE — Progress Notes (Signed)
 PHARMACY - ANTICOAGULATION CONSULT NOTE  Pharmacy Consult for Heparin  Indication: chest pain/ACS  Allergies[1]  Patient Measurements: Height: 4' 11 (149.9 cm) Weight: 67.3 kg (148 lb 5.9 oz) IBW/kg (Calculated) : 43.2 HEPARIN  DW (KG): 57.9  Vital Signs: Temp: 98.6 F (37 C) (12/27 0404) Temp Source: Oral (12/27 0404) BP: 149/51 (12/27 0404) Pulse Rate: 74 (12/27 0404)  Labs: Recent Labs    07/07/24 1550 07/08/24 0420 07/08/24 0956 07/08/24 1918 07/09/24 0548  HGB 11.8* 10.5*  --   --  10.4*  HCT 36.2 32.0*  --   --  30.7*  PLT 188 149*  --   --  139*  HEPARINUNFRC  --   --  0.55 0.45 0.50  CREATININE 1.13* 1.12*  --   --   --     Estimated Creatinine Clearance: 36.2 mL/min (A) (by C-G formula based on SCr of 1.12 mg/dL (H)).   Medical History: Past Medical History:  Diagnosis Date   Abnormal chest CT    Anxiety    Arthritis    Rhumatoid and Osteoarthritis   Carotid artery disease    Dr. Serene, s/p left CEA, severe R ICA stenosis   Coronary atherosclerosis of native coronary artery    DES RCA/circumflex 4/10, LVEF 65 -> repeat cath 11/2021 with multivessel CAD including LM stenosis, initial med rx   Depression    Essential hypertension    Fibromyalgia    GERD (gastroesophageal reflux disease)    Hiatal hernia    Hyperlipidemia    Kidney cysts    Macular degeneration    NSTEMI (non-ST elevated myocardial infarction) (HCC)    4/10   Renal artery stenosis    Left renal artery stent 12/13   Restless leg syndrome    Sleep apnea    Spondylosis without myelopathy    Tobacco abuse     Medications:  Scheduled:   aspirin  EC  81 mg Oral Q breakfast   clopidogrel   75 mg Oral Daily   dextromethorphan -guaiFENesin   1 tablet Oral BID   ipratropium-albuterol   3 mL Nebulization TID   methylPREDNISolone  (SOLU-MEDROL ) injection  40 mg Intravenous Q12H   metoprolol  tartrate  12.5 mg Oral BID   oseltamivir   30 mg Oral BID   rosuvastatin   40 mg Oral Daily     Assessment: 73 y.o. female with with elevated troponin, possible ACS, for heparin .  Heparin  level continues to be at goal this morning on heparin  at 750 units/hr. No bleeding or IV issues noted.   Goal of Therapy:  Heparin  level 0.3-0.7 units/ml Monitor platelets by anticoagulation protocol: Yes   Plan:  Continue heparin  at 750 units/hr Check heparin  level daily with CBC  Dempsey Blush PharmD., BCPS Clinical Pharmacist 07/09/2024 9:17 AM     [1]  Allergies Allergen Reactions   Citalopram Other (See Comments) and Palpitations    Heart rate increased   Ciprofloxacin Hcl Diarrhea and Nausea And Vomiting   Dicyclomine  Diarrhea and Other (See Comments)    Made diarrhea worse   Lamotrigine Diarrhea   Metoclopramide Other (See Comments)    Dizziness   Milnacipran Hcl Other (See Comments)    Unknown   Omeprazole Other (See Comments)    Unknown   Pecan Nut (Diagnostic) Other (See Comments)    Unknown reaction, listed on MAR from facility   Pregabalin Other (See Comments)    Unknown   Serotonin Reuptake Inhibitors (Ssris) Other (See Comments)    Unknown    Simvastatin  Other (See Comments)  Increased back and muscle pain   Tape Other (See Comments)    Skin gets red and skin pulls off, paper tape only   Tizanidine Other (See Comments)    Unknown    Trazodone  And Nefazodone Other (See Comments)    Hallucinations    Venlafaxine Other (See Comments)    Affected eyes, blurred vision   Celecoxib Itching   Codeine Itching   Nefazodone Other (See Comments)   Sertraline  Hcl Other (See Comments)    Stomach upset. Pt takes this med at home 11/2022

## 2024-07-09 NOTE — Progress Notes (Signed)
 " PROGRESS NOTE  [name], is a 73 y.o. female, DOB - 1950/08/19, FMW:[id number]  Admit date - 07/07/2024   Admitting Physician Tully FORBES Carwin, MD  Outpatient Primary MD for the patient is Heather Leta NOVAK, MD  LOS - 1  Chief Complaint  Patient presents with   Shortness of Breath   Chest Pain      Brief Narrative:  73 y.o. female with medical history significant for coronary artery disease, diastolic CHF, CKD 3B, hypertension, hypothyroidism, rheumatoid arthritis, chronic respiratory failure on 2 L admitted on 07/07/2024 with acute on chronic hypoxic respiratory failure in the setting of influenza A infection with superimposed bronchitis    -Assessment and Plan: 1)Influenza A infection with Bronchitis -RSV and COVID-negative -PCT 0.16 - CTA chest without PE, however showed evidence of bronchitis/reactive airway -- Continue Tamiflu  - c/n Solu-Medrol  judiciously especially due to concerns about #3 below - Also add mucolytics and bronchodilators  2)Acute on chronic hypoxic respiratory failure due to #1 above -Patient at baseline has chronic hypoxic respiratory failure due to idiopathic pulmonary fibrosis - Currently requiring up to 6 L of oxygen typically at baseline she uses 2 L of oxygen via nasal cannula - Anticipate improvement with #1 above  3)Atypical chest pain/elevated Troponin--suspect demand ischemia due to #1 and #2 above---Troponin 24 >> 167 >>193>>154 >>141 -Chest pains mostly related to coughing in the setting of influenza -EKG normal sinus rhythm with LVH - Okay to give IV heparin  for 48 hours--May discontinue IV heparin  on 07/10/2024 - Continue Plavix , aspirin , Crestor  okay to use low-dose metoprolol  if BP tolerates  4) hypothyroidism--- continue levothyroxine   5)CKD stage - 3A - Creatinine is currently actually lower than recent baseline -renally adjust medications, avoid nephrotoxic agents / dehydration  / hypotension  6) chronic anemia of CKD--- Hgb  currently around 10 which is close to baseline - No acute bleeding concerns - Mild thrombocytopenia noted with platelets of 139---- watch closely while on IV heparin   Status is: Inpatient   Disposition: The patient is from: ALF--[facility] [place]              Anticipated d/c is to: [facility]              Anticipated d/c date is: 2 days              Patient currently is not medically stable to d/c. Barriers: Not Clinically Stable-   Code Status :  -  Code Status: Full Code   Family Communication:   NA (patient is alert, awake and coherent)   DVT Prophylaxis  :   - SCDs      Lab Results  Component Value Date   PLT 139 (L) 07/09/2024    Inpatient Medications  Scheduled Meds:  ALPRAZolam   0.5 mg Oral BID   aspirin  EC  81 mg Oral Q breakfast   clopidogrel   75 mg Oral Daily   dextromethorphan -guaiFENesin   1 tablet Oral BID   ipratropium-albuterol   3 mL Nebulization TID   methylPREDNISolone  (SOLU-MEDROL ) injection  40 mg Intravenous Q12H   metoprolol  tartrate  12.5 mg Oral BID   oseltamivir   30 mg Oral BID   rosuvastatin   40 mg Oral Daily   Continuous Infusions:  heparin  750 Units/hr (07/09/24 1731)   promethazine  (PHENERGAN ) injection (IM or IVPB) 12.5 mg (07/07/24 2244)   PRN Meds:.acetaminophen  **OR** acetaminophen , albuterol , guaiFENesin -dextromethorphan , mouth rinse, oxyCODONE -acetaminophen , promethazine  (PHENERGAN ) injection (IM or IVPB)   Anti-infectives (From admission, onward)    Start  Dose/Rate Route Frequency Ordered Stop   07/08/24 1700  cefTRIAXone  (ROCEPHIN ) 2 g in sodium chloride  0.9 % 100 mL IVPB  Status:  Discontinued        2 g 200 mL/hr over 30 Minutes Intravenous Every 24 hours 07/07/24 2117 07/08/24 0505   07/08/24 1700  azithromycin  (ZITHROMAX ) 500 mg in sodium chloride  0.9 % 250 mL IVPB  Status:  Discontinued        500 mg 250 mL/hr over 60 Minutes Intravenous Every 24 hours 07/07/24 2117 07/08/24 0505   07/08/24 1000  oseltamivir  (TAMIFLU )  capsule 30 mg        30 mg Oral 2 times daily 07/07/24 2024 07/13/24 0959   07/07/24 1730  azithromycin  (ZITHROMAX ) 500 mg in sodium chloride  0.9 % 250 mL IVPB        500 mg 250 mL/hr over 60 Minutes Intravenous  Once 07/07/24 1715 07/08/24 0705   07/07/24 1715  oseltamivir  (TAMIFLU ) capsule 75 mg  Status:  Discontinued        75 mg Oral 2 times daily 07/07/24 1709 07/07/24 2024   07/07/24 1715  cefTRIAXone  (ROCEPHIN ) 2 g in sodium chloride  0.9 % 100 mL IVPB        2 g 200 mL/hr over 30 Minutes Intravenous  Once 07/07/24 1709 07/07/24 1816      Subjective: Foy Standing today has no fevers, no emesis,  No further chest pain,    - Cough dyspnea and hypoxia persist - No bleeding concerns while on IV heparin  drip  Objective: Vitals:   07/08/24 1946 07/08/24 2024 07/09/24 0404 07/09/24 1343  BP: 113/65  (!) 149/51 (!) 100/43  Pulse: 81  74 68  Resp: 17  18 18   Temp: 99.1 F (37.3 C)  98.6 F (37 C) 99.1 F (37.3 C)  TempSrc: Oral  Oral Oral  SpO2: 100% 98% 99% 99%  Weight:   67.3 kg   Height:        Intake/Output Summary (Last 24 hours) at 07/09/2024 1907 Last data filed at 07/09/2024 1743 Gross per 24 hour  Intake 827.1 ml  Output --  Net 827.1 ml   Filed Weights   07/07/24 1556 07/08/24 0500 07/09/24 0404  Weight: 67 kg 68.5 kg 67.3 kg    Physical Exam Gen:- Awake Alert, no acute distress HEENT:- [place].AT, No sclera icterus Nose- [place] 6L/min Neck-Supple Neck,No JVD,.  Lungs-diminished breath sounds with scattered wheezes  CV- S1, S2 normal, regular  Abd-  +ve B.Sounds, Abd Soft, No tenderness,    Extremity/Skin:- No  edema, pedal pulses present  Psych-affect is appropriate, oriented x3 Neuro-generalized weakness, no new focal deficits, no tremors  Data Reviewed: I have personally reviewed following labs and imaging studies  CBC: Recent Labs  Lab 07/07/24 1550 07/08/24 0420 07/09/24 0548  WBC 9.4 5.6 2.0*  NEUTROABS 6.9  --   --   HGB 11.8* 10.5* 10.4*   HCT 36.2 32.0* 30.7*  MCV 97.3 96.7 94.5  PLT 188 149* 139*   Basic Metabolic Panel: Recent Labs  Lab 07/07/24 1550 07/08/24 0420  NA 131* 133*  K 4.0 4.3  CL 93* 92*  CO2 21* 32  GLUCOSE 114* 85  BUN 16 17  CREATININE 1.13* 1.12*  CALCIUM  9.3 8.9   GFR: Estimated Creatinine Clearance: 36.2 mL/min (A) (by C-G formula based on SCr of 1.12 mg/dL (H)). Liver Function Tests: Recent Labs  Lab 07/07/24 1550  AST 25  ALT 7  ALKPHOS 84  BILITOT 0.5  PROT 8.1  ALBUMIN  4.7   BNP (last 3 results) Recent Labs    04/15/24 1325 04/27/24 2040 07/07/24 1550  PROBNP 5,566.0* 775.0* 3,200.0*   Recent Results (from the past 240 hours)  Resp panel by RT-PCR (RSV, Flu A&B, Covid) Anterior Nasal Swab     Status: Abnormal   Collection Time: 07/07/24  3:51 PM   Specimen: Anterior Nasal Swab  Result Value Ref Range Status   SARS Coronavirus 2 by RT PCR NEGATIVE NEGATIVE Final    Comment: (NOTE) SARS-CoV-2 target nucleic acids are NOT DETECTED.  The SARS-CoV-2 RNA is generally detectable in upper respiratory specimens during the acute phase of infection. The lowest concentration of SARS-CoV-2 viral copies this assay can detect is 138 copies/mL. A negative result does not preclude SARS-Cov-2 infection and should not be used as the sole basis for treatment or other patient management decisions. A negative result may occur with  improper specimen collection/handling, submission of specimen other than nasopharyngeal swab, presence of viral mutation(s) within the areas targeted by this assay, and inadequate number of viral copies(<138 copies/mL). A negative result must be combined with clinical observations, patient history, and epidemiological information. The expected result is Negative.  Fact Sheet for Patients:  bloggercourse.com  Fact Sheet for Healthcare Providers:  seriousbroker.it  This test is no t yet approved or cleared  by the [place]  FDA and  has been authorized for detection and/or diagnosis of SARS-CoV-2 by FDA under an Emergency Use Authorization (EUA). This EUA will remain  in effect (meaning this test can be used) for the duration of the COVID-19 declaration under Section 564(b)(1) of the Act, 21 U.S.C.section 360bbb-3(b)(1), unless the authorization is terminated  or revoked sooner.       Influenza A by PCR POSITIVE (A) NEGATIVE Final   Influenza B by PCR NEGATIVE NEGATIVE Final    Comment: (NOTE) The Xpert Xpress SARS-CoV-2/FLU/RSV plus assay is intended as an aid in the diagnosis of influenza from Nasopharyngeal swab specimens and should not be used as a sole basis for treatment. Nasal washings and aspirates are unacceptable for Xpert Xpress SARS-CoV-2/FLU/RSV testing.  Fact Sheet for Patients: bloggercourse.com  Fact Sheet for Healthcare Providers: seriousbroker.it  This test is not yet approved or cleared by the [place]  FDA and has been authorized for detection and/or diagnosis of SARS-CoV-2 by FDA under an Emergency Use Authorization (EUA). This EUA will remain in effect (meaning this test can be used) for the duration of the COVID-19 declaration under Section 564(b)(1) of the Act, 21 U.S.C. section 360bbb-3(b)(1), unless the authorization is terminated or revoked.     Resp Syncytial Virus by PCR NEGATIVE NEGATIVE Final    Comment: (NOTE) Fact Sheet for Patients: bloggercourse.com  Fact Sheet for Healthcare Providers: seriousbroker.it  This test is not yet approved or cleared by the [place]  FDA and has been authorized for detection and/or diagnosis of SARS-CoV-2 by FDA under an Emergency Use Authorization (EUA). This EUA will remain in effect (meaning this test can be used) for the duration of the COVID-19 declaration under Section 564(b)(1) of the Act, 21  U.S.C. section 360bbb-3(b)(1), unless the authorization is terminated or revoked.  Performed at [facility], [address], [place], [place] [address]   Culture, blood (Routine X 2) w Reflex to ID Panel     Status: None (Preliminary result)   Collection Time: 07/07/24 10:08 PM   Specimen: BLOOD  Result Value Ref Range Status   Specimen Description BLOOD BLOOD LEFT ARM  Final   Special Requests   Final    BOTTLES DRAWN AEROBIC AND ANAEROBIC Blood Culture adequate volume   Culture   Final    NO GROWTH < 12 HOURS Performed at [facility], [address] [place], [place] [address]    Report Status PENDING  Incomplete  Culture, blood (Routine X 2) w Reflex to ID Panel     Status: None (Preliminary result)   Collection Time: 07/07/24 10:08 PM   Specimen: BLOOD  Result Value Ref Range Status   Specimen Description BLOOD BLOOD LEFT HAND  Final   Special Requests   Final    BOTTLES DRAWN AEROBIC AND ANAEROBIC Blood Culture adequate volume   Culture   Final    NO GROWTH < 12 HOURS Performed at [facility], [address] [place], [place] [address]    Report Status PENDING  Incomplete    Radiology Studies: CT Angio Chest Pulmonary Embolism (PE) W or WO Contrast Result Date: 07/08/2024 EXAM: CTA CHEST 07/08/2024 02:38:59 AM TECHNIQUE: CTA of the chest was performed with the administration of 75 mL of iohexol  (OMNIPAQUE ) 350 MG/ML intravenous contrast. Multiplanar reformatted images are provided for review. MIP images are provided for review. Automated exposure control, iterative reconstruction, and/or weight based adjustment of the mA/kV was utilized to reduce the radiation dose to as low as reasonably achievable. COMPARISON: None available. CLINICAL HISTORY: Pulmonary embolism (PE) suspected, high prob. FINDINGS: PULMONARY ARTERIES: Pulmonary arteries are adequately opacified for evaluation. Negative for pulmonary embolism. Evaluation of the segmental and subsegmental pulmonary arteries is  limited due to respiratory motion artifact. Main pulmonary artery is normal in caliber. MEDIASTINUM: The heart and pericardium demonstrate no acute abnormality. Aortic and coronary artery atherosclerotic calcification. There is no acute abnormality of the thoracic aorta. LYMPH NODES: No mediastinal, hilar or axillary lymphadenopathy. LUNGS AND PLEURA: Respiratory motion obscures detail on the lungs. Clustered nodularity in the right upper lobe on series 4 image 33 is likely due to small airway infection / inflammation. Debris in the trachea. Bronchial wall thickening and mucus plugging. Trace right pleural effusion. No pneumothorax. UPPER ABDOMEN: Limited images of the upper abdomen are unremarkable. SOFT TISSUES AND BONES: No acute bone or soft tissue abnormality. IMPRESSION: 1. No pulmonary embolism; limited evaluation of the segmental and subsegmental pulmonary arteries secondary to respiratory motion artifact. 2. Constellation of pulmonary findings suggest bronchitis / reactive airways. Limited evaluation of the lungs due to motion artifact. Electronically signed by: Norman Gatlin MD 07/08/2024 02:50 AM EST RP Workstation: [id number]   Scheduled Meds:  ALPRAZolam   0.5 mg Oral BID   aspirin  EC  81 mg Oral Q breakfast   clopidogrel   75 mg Oral Daily   dextromethorphan -guaiFENesin   1 tablet Oral BID   ipratropium-albuterol   3 mL Nebulization TID   methylPREDNISolone  (SOLU-MEDROL ) injection  40 mg Intravenous Q12H   metoprolol  tartrate  12.5 mg Oral BID   oseltamivir   30 mg Oral BID   rosuvastatin   40 mg Oral Daily   Continuous Infusions:  heparin  750 Units/hr (07/09/24 1731)   promethazine  (PHENERGAN ) injection (IM or IVPB) 12.5 mg (07/07/24 2244)    LOS: 1 day   Rendall Carwin M.D on 07/09/2024 at 7:07 PM  Go to [email or web address] - for contact info  [facility] - Office  [phone]  If 7PM-7AM, please contact night-coverage [email or web address] 07/09/2024, 7:07 PM    "

## 2024-07-10 DIAGNOSIS — I5033 Acute on chronic diastolic (congestive) heart failure: Secondary | ICD-10-CM | POA: Diagnosis not present

## 2024-07-10 LAB — CBC
HCT: 31.9 % — ABNORMAL LOW (ref 36.0–46.0)
Hemoglobin: 10.7 g/dL — ABNORMAL LOW (ref 12.0–15.0)
MCH: 31.7 pg (ref 26.0–34.0)
MCHC: 33.5 g/dL (ref 30.0–36.0)
MCV: 94.4 fL (ref 80.0–100.0)
Platelets: 153 K/uL (ref 150–400)
RBC: 3.38 MIL/uL — ABNORMAL LOW (ref 3.87–5.11)
RDW: 14.4 % (ref 11.5–15.5)
WBC: 3.3 K/uL — ABNORMAL LOW (ref 4.0–10.5)
nRBC: 0 % (ref 0.0–0.2)

## 2024-07-10 LAB — RENAL FUNCTION PANEL
Albumin: 4.1 g/dL (ref 3.5–5.0)
Anion gap: 10 (ref 5–15)
BUN: 34 mg/dL — ABNORMAL HIGH (ref 8–23)
CO2: 29 mmol/L (ref 22–32)
Calcium: 9 mg/dL (ref 8.9–10.3)
Chloride: 93 mmol/L — ABNORMAL LOW (ref 98–111)
Creatinine, Ser: 1.21 mg/dL — ABNORMAL HIGH (ref 0.44–1.00)
GFR, Estimated: 47 mL/min — ABNORMAL LOW
Glucose, Bld: 124 mg/dL — ABNORMAL HIGH (ref 70–99)
Phosphorus: 3.7 mg/dL (ref 2.5–4.6)
Potassium: 4.7 mmol/L (ref 3.5–5.1)
Sodium: 132 mmol/L — ABNORMAL LOW (ref 135–145)

## 2024-07-10 LAB — HEPARIN LEVEL (UNFRACTIONATED): Heparin Unfractionated: 0.41 [IU]/mL (ref 0.30–0.70)

## 2024-07-10 MED ORDER — ENOXAPARIN SODIUM 40 MG/0.4ML IJ SOSY
40.0000 mg | PREFILLED_SYRINGE | INTRAMUSCULAR | Status: DC
  Administered 2024-07-10: 40 mg via SUBCUTANEOUS
  Filled 2024-07-10: qty 0.4

## 2024-07-10 MED ORDER — IPRATROPIUM-ALBUTEROL 0.5-2.5 (3) MG/3ML IN SOLN
3.0000 mL | Freq: Two times a day (BID) | RESPIRATORY_TRACT | Status: DC
  Administered 2024-07-10 – 2024-07-11 (×2): 3 mL via RESPIRATORY_TRACT
  Filled 2024-07-10: qty 3

## 2024-07-10 MED ORDER — TRAZODONE HCL 50 MG PO TABS
100.0000 mg | ORAL_TABLET | Freq: Every day | ORAL | Status: DC
  Administered 2024-07-10: 100 mg via ORAL
  Filled 2024-07-10: qty 2

## 2024-07-10 NOTE — Progress Notes (Signed)
No acute events overnight. B Raedyn Wenke RN

## 2024-07-10 NOTE — Progress Notes (Signed)
 Able to wean to 2 liters [place].  Sat in chair today and has been sitting on side of bed for meals. SOB with exertion and C/O rib  pain from coughing and right knee pain from OA.  Received prn pain medication and robitussin.

## 2024-07-10 NOTE — Progress Notes (Signed)
 " PROGRESS NOTE  [name], is a 73 y.o. female, DOB - 11/22/1950, FMW:[id number]  Admit date - 07/07/2024   Admitting Physician Tully FORBES Carwin, MD  Outpatient Primary MD for the patient is Heather Leta NOVAK, MD  LOS - 2  Chief Complaint  Patient presents with   Shortness of Breath   Chest Pain      Brief Narrative:  73 y.o. female with medical history significant for coronary artery disease, diastolic CHF, CKD 3B, hypertension, hypothyroidism, rheumatoid arthritis, chronic respiratory failure on 2 L admitted on 07/07/2024 with acute on chronic hypoxic respiratory failure in the setting of influenza A infection with superimposed bronchitis    -Assessment and Plan: 1)Influenza A infection with Bronchitis -RSV and COVID-negative -PCT 0.16 - CTA chest without PE, however showed evidence of bronchitis/reactive airway -- Continue Tamiflu  - c/n Solu-Medrol  judiciously especially due to concerns about #3 below - Also add mucolytics and bronchodilators  2)Acute on chronic hypoxic respiratory failure due to #1 above -Patient at baseline has chronic hypoxic respiratory failure due to idiopathic pulmonary fibrosis - This admission required up to 6 L of oxygen via nasal cannula -Currently requiring 4 L of oxygen via nasal cannula  at baseline she uses 2 L of oxygen via nasal cannula - Anticipate improvement with #1 above  3)Atypical chest pain/elevated Troponin--suspect demand ischemia due to #1 and #2 above---Troponin 24 >> 167 >>193>>154 >>141 -Chest pains mostly related to coughing in the setting of influenza -EKG normal sinus rhythm with LVH -No further chest pains, completed 48 hours of IV heparin  - Continue Plavix , aspirin , Crestor  okay to use low-dose metoprolol  if BP tolerates  4) hypothyroidism--- continue levothyroxine   5)CKD stage - 3A - Creatinine is currently actually lower than recent baseline -renally adjust medications, avoid nephrotoxic agents / dehydration  /  hypotension  6) chronic anemia of CKD--- Hgb currently around 10 which is close to baseline - No acute bleeding concerns - Mild thrombocytopenia noted with platelets of 139----  Status is: Inpatient   Disposition: The patient is from: ALF--[facility]              Anticipated d/c is to: [facility]              Anticipated d/c date is: 2 days              Patient currently is not medically stable to d/c. Barriers: Not Clinically Stable-   Code Status :  -  Code Status: Full Code   Family Communication:   NA (patient is alert, awake and coherent)   DVT Prophylaxis  :   - SCDs   enoxaparin  (LOVENOX ) injection 40 mg Start: 07/10/24 1800   Lab Results  Component Value Date   PLT 153 07/10/2024    Inpatient Medications  Scheduled Meds:  ALPRAZolam   0.5 mg Oral BID   aspirin  EC  81 mg Oral Q breakfast   clopidogrel   75 mg Oral Daily   dextromethorphan -guaiFENesin   1 tablet Oral BID   enoxaparin  (LOVENOX ) injection  40 mg Subcutaneous Q24H   ipratropium-albuterol   3 mL Nebulization BID   levothyroxine   25 mcg Oral Q0600   methylPREDNISolone  (SOLU-MEDROL ) injection  40 mg Intravenous Q12H   metoprolol  tartrate  12.5 mg Oral BID   midodrine   10 mg Oral TID WC   oseltamivir   30 mg Oral BID   rosuvastatin   40 mg Oral Daily   Continuous Infusions:  promethazine  (PHENERGAN ) injection (IM or IVPB) 12.5 mg (07/07/24  2244)   PRN Meds:.acetaminophen  **OR** acetaminophen , albuterol , guaiFENesin -dextromethorphan , mouth rinse, oxyCODONE -acetaminophen , promethazine  (PHENERGAN ) injection (IM or IVPB)   Anti-infectives (From admission, onward)    Start     Dose/Rate Route Frequency Ordered Stop   07/08/24 1700  cefTRIAXone  (ROCEPHIN ) 2 g in sodium chloride  0.9 % 100 mL IVPB  Status:  Discontinued        2 g 200 mL/hr over 30 Minutes Intravenous Every 24 hours 07/07/24 2117 07/08/24 0505   07/08/24 1700  azithromycin  (ZITHROMAX ) 500 mg in sodium chloride  0.9 % 250 mL IVPB  Status:   Discontinued        500 mg 250 mL/hr over 60 Minutes Intravenous Every 24 hours 07/07/24 2117 07/08/24 0505   07/08/24 1000  oseltamivir  (TAMIFLU ) capsule 30 mg        30 mg Oral 2 times daily 07/07/24 2024 07/13/24 0959   07/07/24 1730  azithromycin  (ZITHROMAX ) 500 mg in sodium chloride  0.9 % 250 mL IVPB        500 mg 250 mL/hr over 60 Minutes Intravenous  Once 07/07/24 1715 07/08/24 0705   07/07/24 1715  oseltamivir  (TAMIFLU ) capsule 75 mg  Status:  Discontinued        75 mg Oral 2 times daily 07/07/24 1709 07/07/24 2024   07/07/24 1715  cefTRIAXone  (ROCEPHIN ) 2 g in sodium chloride  0.9 % 100 mL IVPB        2 g 200 mL/hr over 30 Minutes Intravenous  Once 07/07/24 1709 07/07/24 1816      Subjective: Foy Standing today has no fevers, no emesis,  No further chest pain,    - Cough dyspnea and hypoxia persist - No bleeding concerns while on IV heparin  drip  Objective: Vitals:   07/10/24 0300 07/10/24 0557 07/10/24 1230 07/10/24 1347  BP: (!) 149/48  (!) 123/40 (!) 145/45  Pulse: 77     Resp: 17  17   Temp: 97.7 F (36.5 C)  98.2 F (36.8 C)   TempSrc: Oral  Oral   SpO2: 98%  100% 98%  Weight:  68.4 kg    Height:        Intake/Output Summary (Last 24 hours) at 07/10/2024 1737 Last data filed at 07/10/2024 0900 Gross per 24 hour  Intake 890.12 ml  Output 1800 ml  Net -909.88 ml   Filed Weights   07/08/24 0500 07/09/24 0404 07/10/24 0557  Weight: 68.5 kg 67.3 kg 68.4 kg   Physical Exam Gen:- Awake Alert, no acute distress HEENT:- Opp.AT, No sclera icterus Nose- [place] 4L/min Neck-Supple Neck,No JVD,.  Lungs-improving air movement, few scattered wheezes  CV- S1, S2 normal, regular  Abd-  +ve B.Sounds, Abd Soft, No tenderness,    Extremity/Skin:- No  edema, pedal pulses present  Psych-affect is appropriate, oriented x3 Neuro-generalized weakness, no new focal deficits, no tremors  Data Reviewed: I have personally reviewed following labs and imaging  studies  CBC: Recent Labs  Lab 07/07/24 1550 07/08/24 0420 07/09/24 0548 07/10/24 0430  WBC 9.4 5.6 2.0* 3.3*  NEUTROABS 6.9  --   --   --   HGB 11.8* 10.5* 10.4* 10.7*  HCT 36.2 32.0* 30.7* 31.9*  MCV 97.3 96.7 94.5 94.4  PLT 188 149* 139* 153   Basic Metabolic Panel: Recent Labs  Lab 07/07/24 1550 07/08/24 0420 07/10/24 0430  NA 131* 133* 132*  K 4.0 4.3 4.7  CL 93* 92* 93*  CO2 21* 32 29  GLUCOSE 114* 85 124*  BUN 16 17 34*  CREATININE 1.13* 1.12* 1.21*  CALCIUM  9.3 8.9 9.0  PHOS  --   --  3.7   GFR: Estimated Creatinine Clearance: 33.8 mL/min (A) (by C-G formula based on SCr of 1.21 mg/dL (H)). Liver Function Tests: Recent Labs  Lab 07/07/24 1550 07/10/24 0430  AST 25  --   ALT 7  --   ALKPHOS 84  --   BILITOT 0.5  --   PROT 8.1  --   ALBUMIN  4.7 4.1   BNP (last 3 results) Recent Labs    04/15/24 1325 04/27/24 2040 07/07/24 1550  PROBNP 5,566.0* 775.0* 3,200.0*   Recent Results (from the past 240 hours)  Resp panel by RT-PCR (RSV, Flu A&B, Covid) Anterior Nasal Swab     Status: Abnormal   Collection Time: 07/07/24  3:51 PM   Specimen: Anterior Nasal Swab  Result Value Ref Range Status   SARS Coronavirus 2 by RT PCR NEGATIVE NEGATIVE Final    Comment: (NOTE) SARS-CoV-2 target nucleic acids are NOT DETECTED.  The SARS-CoV-2 RNA is generally detectable in upper respiratory specimens during the acute phase of infection. The lowest concentration of SARS-CoV-2 viral copies this assay can detect is 138 copies/mL. A negative result does not preclude SARS-Cov-2 infection and should not be used as the sole basis for treatment or other patient management decisions. A negative result may occur with  improper specimen collection/handling, submission of specimen other than nasopharyngeal swab, presence of viral mutation(s) within the areas targeted by this assay, and inadequate number of viral copies(<138 copies/mL). A negative result must be combined  with clinical observations, patient history, and epidemiological information. The expected result is Negative.  Fact Sheet for Patients:  bloggercourse.com  Fact Sheet for Healthcare Providers:  seriousbroker.it  This test is no t yet approved or cleared by the [place]  FDA and  has been authorized for detection and/or diagnosis of SARS-CoV-2 by FDA under an Emergency Use Authorization (EUA). This EUA will remain  in effect (meaning this test can be used) for the duration of the COVID-19 declaration under Section 564(b)(1) of the Act, 21 U.S.C.section 360bbb-3(b)(1), unless the authorization is terminated  or revoked sooner.       Influenza A by PCR POSITIVE (A) NEGATIVE Final   Influenza B by PCR NEGATIVE NEGATIVE Final    Comment: (NOTE) The Xpert Xpress SARS-CoV-2/FLU/RSV plus assay is intended as an aid in the diagnosis of influenza from Nasopharyngeal swab specimens and should not be used as a sole basis for treatment. Nasal washings and aspirates are unacceptable for Xpert Xpress SARS-CoV-2/FLU/RSV testing.  Fact Sheet for Patients: bloggercourse.com  Fact Sheet for Healthcare Providers: seriousbroker.it  This test is not yet approved or cleared by the [place]  FDA and has been authorized for detection and/or diagnosis of SARS-CoV-2 by FDA under an Emergency Use Authorization (EUA). This EUA will remain in effect (meaning this test can be used) for the duration of the COVID-19 declaration under Section 564(b)(1) of the Act, 21 U.S.C. section 360bbb-3(b)(1), unless the authorization is terminated or revoked.     Resp Syncytial Virus by PCR NEGATIVE NEGATIVE Final    Comment: (NOTE) Fact Sheet for Patients: bloggercourse.com  Fact Sheet for Healthcare Providers: seriousbroker.it  This test is not yet  approved or cleared by the [place]  FDA and has been authorized for detection and/or diagnosis of SARS-CoV-2 by FDA under an Emergency Use Authorization (EUA). This EUA will remain in effect (meaning this test can be used) for the duration of  the COVID-19 declaration under Section 564(b)(1) of the Act, 21 U.S.C. section 360bbb-3(b)(1), unless the authorization is terminated or revoked.  Performed at [facility], [address], [place], [place] [address]   Culture, blood (Routine X 2) w Reflex to ID Panel     Status: None (Preliminary result)   Collection Time: 07/07/24 10:08 PM   Specimen: BLOOD  Result Value Ref Range Status   Specimen Description BLOOD BLOOD LEFT ARM  Final   Special Requests   Final    BOTTLES DRAWN AEROBIC AND ANAEROBIC Blood Culture adequate volume   Culture   Final    NO GROWTH 3 DAYS Performed at [facility], [address], [place], [place] [address]    Report Status PENDING  Incomplete  Culture, blood (Routine X 2) w Reflex to ID Panel     Status: None (Preliminary result)   Collection Time: 07/07/24 10:08 PM   Specimen: BLOOD  Result Value Ref Range Status   Specimen Description BLOOD BLOOD LEFT HAND  Final   Special Requests   Final    BOTTLES DRAWN AEROBIC AND ANAEROBIC Blood Culture adequate volume   Culture   Final    NO GROWTH 3 DAYS Performed at [facility], [address] [place], [place] [address]    Report Status PENDING  Incomplete    Scheduled Meds:  ALPRAZolam   0.5 mg Oral BID   aspirin  EC  81 mg Oral Q breakfast   clopidogrel   75 mg Oral Daily   dextromethorphan -guaiFENesin   1 tablet Oral BID   enoxaparin  (LOVENOX ) injection  40 mg Subcutaneous Q24H   ipratropium-albuterol   3 mL Nebulization BID   levothyroxine   25 mcg Oral Q0600   methylPREDNISolone  (SOLU-MEDROL ) injection  40 mg Intravenous Q12H   metoprolol  tartrate  12.5 mg Oral BID   midodrine   10 mg Oral TID WC   oseltamivir   30 mg Oral BID   rosuvastatin   40 mg Oral  Daily   Continuous Infusions:  promethazine  (PHENERGAN ) injection (IM or IVPB) 12.5 mg (07/07/24 2244)    LOS: 2 days   Rendall Carwin M.D on 07/10/2024 at 5:37 PM  Go to [email or web address] - for contact info  [facility] - Office  [phone]  If 7PM-7AM, please contact night-coverage [email or web address] 07/10/2024, 5:37 PM    "

## 2024-07-10 NOTE — Plan of Care (Signed)
   Problem: Clinical Measurements: Goal: Respiratory complications will improve Outcome: Progressing   Problem: Activity: Goal: Risk for activity intolerance will decrease Outcome: Progressing

## 2024-07-10 NOTE — Plan of Care (Signed)
   Problem: Activity: Goal: Risk for activity intolerance will decrease Outcome: Progressing   Problem: Coping: Goal: Level of anxiety will decrease Outcome: Progressing

## 2024-07-10 NOTE — TOC Progression Note (Signed)
 Transition of Care [facility]) - Progression Note    Patient Details  Name: [name] MRN: [id number] Date of Birth: [birth year]  [facility] CM/SW Contact  Lucie Lunger, [place] Phone Number: 07/10/2024, 10:32 AM  Clinical Narrative:    CSW updated that pt will likely be ready for D/C back to [facility] tomorrow. CSW spoke to Malcolm with [facility] to provide update on D/C plans. TOC to follow.   Expected Discharge Plan: Assisted Living Barriers to Discharge: Continued Medical Work up               Expected Discharge Plan and Services                                               Social Drivers of Health (SDOH) Interventions SDOH Screenings   Food Insecurity: No Food Insecurity (07/07/2024)  Housing: Low Risk (07/07/2024)  Transportation Needs: No Transportation Needs (07/07/2024)  Utilities: Not At Risk (07/07/2024)  Financial Resource Strain: Medium Risk (07/31/2023)   Received from [facility]  Social Connections: Moderately Isolated (07/07/2024)  Tobacco Use: Medium Risk (07/07/2024)  Health Literacy: High Risk (07/31/2023)   Received from [facility]    Readmission Risk Interventions    04/12/2024   11:48 AM 11/27/2023    7:51 AM 10/20/2023    8:13 PM  Readmission Risk Prevention Plan  Transportation Screening Complete Complete Complete  PCP or Specialist Appt within 3-5 Days Not Complete  Complete  HRI or Home Care Consult Complete  Complete  Social Work Consult for Recovery Care Planning/Counseling Complete  Complete  Palliative Care Screening Not Applicable  Not Applicable  Medication Review Oceanographer) Complete Complete Complete  HRI or Home Care Consult  Complete   SW Recovery Care/Counseling Consult  Complete   Palliative Care Screening  Not Applicable   Skilled Nursing Facility  Not Applicable

## 2024-07-11 DIAGNOSIS — I5033 Acute on chronic diastolic (congestive) heart failure: Secondary | ICD-10-CM | POA: Diagnosis not present

## 2024-07-11 MED ORDER — ALBUTEROL SULFATE HFA 108 (90 BASE) MCG/ACT IN AERS: 1.0000 | INHALATION_SPRAY | Freq: Four times a day (QID) | RESPIRATORY_TRACT | 2 refills | Status: AC | PRN

## 2024-07-11 MED ORDER — OSELTAMIVIR PHOSPHATE 30 MG PO CAPS: 30.0000 mg | ORAL_CAPSULE | Freq: Two times a day (BID) | ORAL | 0 refills | Status: DC

## 2024-07-11 MED ORDER — OSELTAMIVIR PHOSPHATE 30 MG PO CAPS: 30.0000 mg | ORAL_CAPSULE | Freq: Two times a day (BID) | ORAL | 0 refills | Status: AC

## 2024-07-11 MED ORDER — PREDNISONE 20 MG PO TABS: 40.0000 mg | ORAL_TABLET | Freq: Every day | ORAL | 0 refills | Status: DC

## 2024-07-11 MED ORDER — ROSUVASTATIN CALCIUM 40 MG PO TABS: 40.0000 mg | ORAL_TABLET | Freq: Every day | ORAL | 5 refills | Status: AC

## 2024-07-11 MED ORDER — IPRATROPIUM-ALBUTEROL 0.5-2.5 (3) MG/3ML IN SOLN: 3.0000 mL | Freq: Three times a day (TID) | RESPIRATORY_TRACT | 2 refills | Status: DC

## 2024-07-11 MED ORDER — ASPIRIN 81 MG PO TBEC: 81.0000 mg | DELAYED_RELEASE_TABLET | Freq: Every day | ORAL | 11 refills | Status: AC

## 2024-07-11 MED ORDER — GUAIFENESIN ER 600 MG PO TB12: 600.0000 mg | ORAL_TABLET | Freq: Two times a day (BID) | ORAL | 0 refills | Status: DC

## 2024-07-11 MED ORDER — ROSUVASTATIN CALCIUM 40 MG PO TABS: 40.0000 mg | ORAL_TABLET | Freq: Every day | ORAL | 5 refills | Status: DC

## 2024-07-11 MED ORDER — CLOPIDOGREL BISULFATE 75 MG PO TABS: 75.0000 mg | ORAL_TABLET | Freq: Every day | ORAL | 2 refills | Status: AC

## 2024-07-11 MED ORDER — MIDODRINE HCL 10 MG PO TABS: 10.0000 mg | ORAL_TABLET | Freq: Three times a day (TID) | ORAL | 1 refills | Status: DC

## 2024-07-11 MED ORDER — ACETAMINOPHEN 325 MG PO TABS: 650.0000 mg | ORAL_TABLET | Freq: Four times a day (QID) | ORAL | Status: AC | PRN

## 2024-07-11 MED ORDER — ALBUTEROL SULFATE HFA 108 (90 BASE) MCG/ACT IN AERS: 1.0000 | INHALATION_SPRAY | Freq: Four times a day (QID) | RESPIRATORY_TRACT | 2 refills | Status: DC | PRN

## 2024-07-11 MED ORDER — IPRATROPIUM-ALBUTEROL 0.5-2.5 (3) MG/3ML IN SOLN: 3.0000 mL | Freq: Three times a day (TID) | RESPIRATORY_TRACT | 2 refills | Status: AC

## 2024-07-11 MED ORDER — ASPIRIN 81 MG PO TBEC: 81.0000 mg | DELAYED_RELEASE_TABLET | Freq: Every day | ORAL | 11 refills | Status: DC

## 2024-07-11 MED ORDER — GUAIFENESIN ER 600 MG PO TB12: 600.0000 mg | ORAL_TABLET | Freq: Two times a day (BID) | ORAL | 0 refills | Status: AC

## 2024-07-11 MED ORDER — METOPROLOL TARTRATE 25 MG PO TABS: 12.5000 mg | ORAL_TABLET | Freq: Two times a day (BID) | ORAL | 1 refills | Status: AC

## 2024-07-11 MED ORDER — MIDODRINE HCL 10 MG PO TABS: 10.0000 mg | ORAL_TABLET | Freq: Three times a day (TID) | ORAL | 1 refills | Status: AC

## 2024-07-11 MED ORDER — PREDNISONE 20 MG PO TABS: 40.0000 mg | ORAL_TABLET | Freq: Every day | ORAL | 0 refills | Status: AC

## 2024-07-11 NOTE — TOC Transition Note (Signed)
 Transition of Care [facility]) - Discharge Note   Patient Details  Name: [name] MRN: [id number] Date of Birth: July 05, 1951  Transition of Care [facility]) CM/SW Contact:  Mcarthur Saddie Kim, LCSW Phone Number: 07/11/2024, 1:00 PM   Clinical Narrative: Pt d/c today back to [facility]. Pt and facility aware and agreeable. Pt indicates she has notified her daughter. Facility to pick up pt this afternoon. D/C summary and FL2 faxed. RN updated.       Final next level of care: Assisted Living Barriers to Discharge: Barriers Resolved   Patient Goals and CMS Choice Patient states their goals for this hospitalization and ongoing recovery are:: Return to ALF CMS Medicare.gov Compare Post Acute Care list provided to:: Patient Choice offered to / list presented to : Patient [place] ownership interest in [facility].provided to:: Patient    Discharge Placement                Patient to be transferred to facility by: facility fleeta Name of family member notified: pt notified family Patient and family notified of of transfer: 07/11/24  Discharge Plan and Services Additional resources added to the After Visit Summary for                                       Social Drivers of Health (SDOH) Interventions SDOH Screenings   Food Insecurity: No Food Insecurity (07/07/2024)  Housing: Low Risk (07/07/2024)  Transportation Needs: No Transportation Needs (07/07/2024)  Utilities: Not At Risk (07/07/2024)  Financial Resource Strain: Medium Risk (07/31/2023)   Received from [facility]  Social Connections: Moderately Isolated (07/07/2024)  Tobacco Use: Medium Risk (07/07/2024)  Health Literacy: High Risk (07/31/2023)   [facility] from [facility]     Readmission Risk Interventions    04/12/2024   11:48 AM 11/27/2023    7:51 AM 10/20/2023    8:13 PM  Readmission Risk Prevention Plan  Transportation Screening Complete Complete Complete  PCP or Specialist Appt  within 3-5 Days Not Complete  Complete  HRI or Home Care Consult Complete  Complete  Social Work Consult for Recovery Care Planning/Counseling Complete  Complete  Palliative Care Screening Not Applicable  Not Applicable  Medication Review Oceanographer) Complete Complete Complete  HRI or Home Care Consult  Complete   SW Recovery Care/Counseling Consult  Complete   Palliative Care Screening  Not Applicable   Skilled Nursing Facility  Not Applicable

## 2024-07-11 NOTE — Discharge Instructions (Signed)
 1)You need oxygen at home at 2 L via nasal cannula continuously while awake and while asleep--- smoking or having open fires around oxygen can cause fire, significant injury and death  2)Avoid ibuprofen /Advil /Aleve/Motrin Josefine Powders/Naproxen/BC powders/Meloxicam/Diclofenac /Indomethacin and other Nonsteroidal anti-inflammatory medications as these will make you more likely to bleed and can cause stomach ulcers, can also cause Kidney problems.   3) repeat BMP and CBC blood test on Wednesday, July 13, 2024  4) please note that there is been several changes to your medications

## 2024-07-11 NOTE — [place] FL2 (Signed)
 " [place]  MEDICAID FL2 LEVEL OF CARE FORM     IDENTIFICATION  Patient Name: [name] Birthdate: [birth year] Sex: female Admission Date (Current Location): 07/07/2024  [place] and [place] Number:  [place] [id number] R Facility and Address:  [facility],  [address], [place] [address]      Provider Number: [id number]  Attending Physician Name and Address:  Pearlean Manus, MD  Relative Name and Phone Number:  Wilbern Milling  Daughter,  [phone]    Current Level of Care: Hospital Recommended Level of Care: Assisted Living Facility Prior Approval Number:    Date Approved/Denied:   PASRR Number:    Discharge Plan: Other (Comment) (ALF)    Current Diagnoses: Patient Active Problem List   Diagnosis Date Noted   Acute on chronic hypoxic respiratory failure (HCC) 07/08/2024   Influenza A 07/07/2024   Chronic respiratory failure with hypoxia (HCC) 07/07/2024   Iron deficiency anemia 05/10/2024   Rectal bleeding 05/03/2024   Esophageal dysphagia 05/03/2024   Acute on chronic heart failure with preserved ejection fraction (HFpEF) (HCC) 04/16/2024   Acute respiratory failure with hypoxia (HCC) 04/15/2024   Lobar pneumonia 04/15/2024   CAP (community acquired pneumonia) 04/11/2024   Acute renal failure superimposed on stage 3b chronic kidney disease, unspecified acute renal failure type (HCC) 11/25/2023   Acute kidney injury superimposed on CKD 11/24/2023   Chronic diastolic HF (heart failure) (HCC) 10/23/2023   Acute hypoxemic respiratory failure (HCC) 10/20/2023   Status post carotid endarterectomy 07/03/2023   Symptomatic carotid artery stenosis, right 07/03/2023   Anxiety state 06/10/2023   Relational problem 06/10/2023   Acute coronary syndrome with high troponin (HCC) 11/20/2022   Chronic kidney disease stage IIIb 11/18/2022   Obesity (BMI 30-39.9) 11/18/2022   Rheumatoid arthritis (HCC) 11/17/2022   Fibromyalgia 11/17/2022   Hypothyroidism  11/17/2022   Progressive angina with severe left main stenosis 11/16/2022   IPF (idiopathic pulmonary fibrosis) (HCC) 02/25/2022   Abdominal pain, epigastric 11/26/2021   Nausea without vomiting 11/26/2021   Constipation 11/26/2021   Tobacco abuse 11/16/2021   Abnormal chest CT 11/16/2021   Abnormal nuclear cardiac imaging test    Mixed incontinence 07/16/2021   History of UTI 07/16/2021   AMS (altered mental status) 03/11/2020   AKI (acute kidney injury) 03/11/2020   Hypokalemia 03/11/2020   Altered mental status 03/11/2020   Depression    Prolonged QT interval    Unilateral primary osteoarthritis, right knee 02/22/2020   Gastroesophageal reflux disease without esophagitis 01/25/2018   Encounter for colonoscopy due to history of adenomatous colonic polyps 03/23/2017   Melena 01/31/2014   Aftercare following surgery of the circulatory system, NEC 01/30/2014   Tooth pain 05/03/2013   GERD (gastroesophageal reflux disease) 02/16/2013   Bloating 02/16/2013   Renal artery stenosis 11/04/2012   PAD (peripheral artery disease) 05/07/2012   Shortness of breath 04/14/2012   Carotid artery stenosis 11/13/2009   Hyperlipidemia 04/19/2009   Essential hypertension 03/12/2009   CAD (coronary artery disease) 03/12/2009    Orientation RESPIRATION BLADDER Height & Weight     Self, Time, Situation, Place  O2 (2L) Incontinent Weight: 148 lb 2.4 oz (67.2 kg) Height:  4' 11 (149.9 cm)  BEHAVIORAL SYMPTOMS/MOOD NEUROLOGICAL BOWEL NUTRITION STATUS      Continent Diet (Low sodium heart healthy)  AMBULATORY STATUS COMMUNICATION OF NEEDS Skin   Limited Assist Verbally Bruising  Personal Care Assistance Level of Assistance  Bathing, Feeding, Dressing Bathing Assistance: Limited assistance Feeding assistance: Independent Dressing Assistance: Limited assistance     Functional Limitations Info  Sight, Hearing, Speech Sight Info: Impaired Hearing Info:  Impaired Speech Info: Adequate    SPECIAL CARE FACTORS FREQUENCY                       Contractures Contractures Info: Not present    Additional Factors Info  Code Status, Allergies Code Status Info: FULL Allergies Info: Citalopram  Ciprofloxacin Hcl  Dicyclomine   Lamotrigine  Metoclopramide  Milnacipran Hcl  Omeprazole  Pecan Nut (Diagnostic)  Pregabalin  Serotonin Reuptake Inhibitors (Ssris)  Simvastatin   Tape  Tizanidine  Trazodone  And Nefazodone  Venlafaxine  Celecoxib  Codeine  Nefazodone  Sertraline  Hcl           Current Medications (07/11/2024):  This is the current hospital active medication list Current Facility-Administered Medications  Medication Dose Route Frequency Provider Last Rate Last Admin   acetaminophen  (TYLENOL ) tablet 650 mg  650 mg Oral Q6H PRN Emokpae, Ejiroghene E, MD   650 mg at 07/08/24 1612   Or   acetaminophen  (TYLENOL ) suppository 650 mg  650 mg Rectal Q6H PRN Emokpae, Ejiroghene E, MD       albuterol  (PROVENTIL ) (2.5 MG/3ML) 0.083% nebulizer solution 2.5 mg  2.5 mg Nebulization Q4H PRN Emokpae, Ejiroghene E, MD       ALPRAZolam  (XANAX ) tablet 0.5 mg  0.5 mg Oral BID Emokpae, Courage, MD   0.5 mg at 07/11/24 9096   aspirin  EC tablet 81 mg  81 mg Oral Q breakfast Emokpae, Courage, MD   81 mg at 07/11/24 9095   clopidogrel  (PLAVIX ) tablet 75 mg  75 mg Oral Daily Emokpae, Ejiroghene E, MD   75 mg at 07/11/24 0904   dextromethorphan -guaiFENesin  (MUCINEX  DM) 30-600 MG per 12 hr tablet 1 tablet  1 tablet Oral BID Emokpae, Courage, MD   1 tablet at 07/11/24 1251   enoxaparin  (LOVENOX ) injection 40 mg  40 mg Subcutaneous Q24H Emokpae, Courage, MD   40 mg at 07/10/24 1744   guaiFENesin -dextromethorphan  (ROBITUSSIN DM) 100-10 MG/5ML syrup 5 mL  5 mL Oral Q4H PRN Emokpae, Courage, MD   5 mL at 07/10/24 1344   ipratropium-albuterol  (DUONEB) 0.5-2.5 (3) MG/3ML nebulizer solution 3 mL  3 mL Nebulization BID Emokpae, Courage, MD   3 mL at 07/11/24 0726    levothyroxine  (SYNTHROID ) tablet 25 mcg  25 mcg Oral Q0600 Pearlean Manus, MD   25 mcg at 07/11/24 0512   methylPREDNISolone  sodium succinate (SOLU-MEDROL ) 40 mg/mL injection 40 mg  40 mg Intravenous Q12H Emokpae, Courage, MD   40 mg at 07/11/24 9093   metoprolol  tartrate (LOPRESSOR ) tablet 12.5 mg  12.5 mg Oral BID Emokpae, Courage, MD   12.5 mg at 07/11/24 9096   midodrine  (PROAMATINE ) tablet 10 mg  10 mg Oral TID WC Emokpae, Courage, MD   10 mg at 07/11/24 1251   Oral care mouth rinse  15 mL Mouth Rinse PRN Emokpae, Courage, MD       oseltamivir  (TAMIFLU ) capsule 30 mg  30 mg Oral BID Emokpae, Ejiroghene E, MD   30 mg at 07/11/24 0904   oxyCODONE -acetaminophen  (PERCOCET/ROXICET) 5-325 MG per tablet 1 tablet  1 tablet Oral Q6H PRN Emokpae, Ejiroghene E, MD   1 tablet at 07/11/24 0907   promethazine  (PHENERGAN ) 12.5 mg in sodium chloride  0.9 % 50 mL IVPB  12.5 mg Intravenous Q6H  PRN Emokpae, Ejiroghene E, MD 150 mL/hr at 07/07/24 2244 12.5 mg at 07/07/24 2244   rosuvastatin  (CRESTOR ) tablet 40 mg  40 mg Oral Daily Emokpae, Ejiroghene E, MD   40 mg at 07/11/24 9096   traZODone  (DESYREL ) tablet 100 mg  100 mg Oral QHS Adefeso, Oladapo, DO   100 mg at 07/10/24 2304     Discharge Medications:   Medication List       PAUSE taking these medications     torsemide  20 MG tablet Wait to take this until: July 13, 2024 Commonly known as: DEMADEX  Take 0.5 tablets (10 mg total) by mouth daily.           STOP taking these medications     dapagliflozin  propanediol 10 MG Tabs tablet Commonly known as: FARXIGA     hydrocortisone  25 MG suppository Commonly known as: ANUSOL -HC    methocarbamol  500 MG tablet Commonly known as: ROBAXIN            TAKE these medications     Abdominal Binder/Elastic Med Misc 1 each by Does not apply route as directed.    acetaminophen  325 MG tablet Commonly known as: TYLENOL  Take 2 tablets (650 mg total) by mouth every 6 (six) hours as needed for mild  pain (pain score 1-3) or fever (or Fever >/= 101). What changed:  how much to take reasons to take this    albuterol  108 (90 Base) MCG/ACT inhaler Commonly known as: VENTOLIN  HFA Inhale 1 puff into the lungs 4 (four) times daily as needed for wheezing or shortness of breath.    ALPRAZolam  0.5 MG tablet Commonly known as: XANAX  Take 0.5 mg by mouth 2 (two) times daily.    aspirin  EC 81 MG tablet Take 1 tablet (81 mg total) by mouth daily with breakfast. What changed: when to take this    clopidogrel  75 MG tablet Commonly known as: PLAVIX  Take 1 tablet (75 mg total) by mouth daily.    cyanocobalamin  1000 MCG tablet Take 1,000 mcg by mouth daily.    EPINEPHrine 0.3 mg/0.3 mL Soaj injection Commonly known as: EPI-PEN Inject 0.3 mg into the muscle as needed for anaphylaxis.    escitalopram  20 MG tablet Commonly known as: LEXAPRO  Take 20 mg by mouth daily.    ferrous sulfate  325 (65 FE) MG tablet Take 1 tablet (325 mg total) by mouth daily with breakfast.    guaiFENesin  600 MG 12 hr tablet Commonly known as: Mucinex  Take 1 tablet (600 mg total) by mouth 2 (two) times daily.    HYDROcodone -acetaminophen  5-325 MG tablet Commonly known as: NORCO/VICODIN Take 1 tablet by mouth 2 (two) times daily as needed.    ipratropium-albuterol  0.5-2.5 (3) MG/3ML Soln Commonly known as: DUONEB Take 3 mLs by nebulization 3 (three) times daily.    levothyroxine  25 MCG tablet Commonly known as: SYNTHROID  Take 25 mcg by mouth daily before breakfast.    melatonin 3 MG Tabs tablet Take 3 mg by mouth at bedtime.    metoprolol  tartrate 25 MG tablet Commonly known as: LOPRESSOR  Take 0.5 tablets (12.5 mg total) by mouth 2 (two) times daily.    midodrine  10 MG tablet Commonly known as: PROAMATINE  Take 1 tablet (10 mg total) by mouth 3 (three) times daily with meals.    nitroGLYCERIN  0.4 MG SL tablet Commonly known as: NITROSTAT  Place 1 tablet (0.4 mg total) under the tongue every 5  (five) minutes x 3 doses as needed for chest pain (if no relief after 2nd dose,  proceed to ED or call 911).    ondansetron  4 MG tablet Commonly known as: ZOFRAN  Take 4 mg by mouth 3 (three) times daily as needed for nausea or vomiting.    oseltamivir  30 MG capsule Commonly known as: TAMIFLU  Take 1 capsule (30 mg total) by mouth 2 (two) times daily for 3 days.    Oyster Shell Calcium  500 MG Tabs Take 1 tablet by mouth 2 (two) times daily.    pantoprazole  40 MG tablet Commonly known as: PROTONIX  Take 40 mg by mouth daily.    polyethylene glycol powder 17 GM/SCOOP powder Commonly known as: GLYCOLAX /MIRALAX  Take 17 g by mouth daily.    predniSONE  20 MG tablet Commonly known as: DELTASONE  Take 2 tablets (40 mg total) by mouth daily with breakfast for 5 days.    PreserVision AREDS 2 Caps Take 2 capsules by mouth daily.    rosuvastatin  40 MG tablet Commonly known as: CRESTOR  Take 1 tablet (40 mg total) by mouth daily.    traZODone  100 MG tablet Commonly known as: DESYREL  Take 100 mg by mouth at bedtime.    Vitamin D  (Ergocalciferol ) 1.25 MG (50000 UNIT) Caps capsule Commonly known as: DRISDOL  Take 50,000 Units by mouth every Monday.    Relevant Imaging Results:  Relevant Lab Results:   Additional Information SS# [id number]  [place] Saddie Kim, LCSW     "

## 2024-07-11 NOTE — Discharge Summary (Signed)
 "                                                                                 [name], is a 73 y.o. female  DOB [birth year]  MRN [id number].  Admission date:  07/07/2024  Admitting Physician  Tully FORBES Carwin, MD  Discharge Date:  07/11/2024   Primary MD  [name], MD  Recommendations for primary care physician for things to follow:  1)You need oxygen at home at 2 L via nasal cannula continuously while awake and while asleep--- smoking or having open fires around oxygen can cause fire, significant injury and death  2)Avoid ibuprofen /Advil /Aleve/Motrin Josefine Powders/Naproxen/BC powders/Meloxicam/Diclofenac /Indomethacin and other Nonsteroidal anti-inflammatory medications as these will make you more likely to bleed and can cause stomach ulcers, can also cause Kidney problems.   3) repeat BMP and CBC blood test on Wednesday, July 13, 2024  4) please note that there is been several changes to your medications  Admission Diagnosis  Hyponatremia [E87.1] Influenza A [J10.1] Renal insufficiency [N28.9] Acute respiratory failure with hypoxia (HCC) [J96.01] Normochromic normocytic anemia [D64.9] Elevated random blood glucose level [R73.9] Community acquired pneumonia, unspecified laterality [J18.9] Acute on chronic hypoxic respiratory failure (HCC) [J96.21] Acute exacerbation of chronic heart failure (HCC) [I50.9]   Discharge Diagnosis  Hyponatremia [E87.1] Influenza A [J10.1] Renal insufficiency [N28.9] Acute respiratory failure with hypoxia (HCC) [J96.01] Normochromic normocytic anemia [D64.9] Elevated random blood glucose level [R73.9] Community acquired pneumonia, unspecified laterality [J18.9] Acute on chronic hypoxic respiratory failure (HCC) [J96.21] Acute exacerbation of chronic heart failure (HCC) [I50.9]    Principal Problem:   Acute on chronic heart failure with preserved ejection fraction (HFpEF) (HCC) Active Problems:   Influenza A   Essential  hypertension   Hypothyroidism   Rheumatoid arthritis (HCC)   IPF (idiopathic pulmonary fibrosis) (HCC)   Chronic respiratory failure with hypoxia (HCC)   Acute on chronic hypoxic respiratory failure (HCC)      Past Medical History:  Diagnosis Date   Abnormal chest CT    Anxiety    Arthritis    Rhumatoid and Osteoarthritis   Carotid artery disease    Dr. Serene, s/p left CEA, severe R ICA stenosis   Coronary atherosclerosis of native coronary artery    DES RCA/circumflex 4/10, LVEF 65 -> repeat cath 11/2021 with multivessel CAD including LM stenosis, initial med rx   Depression    Essential hypertension    Fibromyalgia    GERD (gastroesophageal reflux disease)    Hiatal hernia    Hyperlipidemia    Kidney cysts    Macular degeneration    NSTEMI (non-ST elevated myocardial infarction) (HCC)    4/10   Renal artery stenosis    Left renal artery stent 12/13   Restless leg syndrome    Sleep apnea    Spondylosis without myelopathy    Tobacco abuse     Past Surgical History:  Procedure Laterality Date   ABDOMINAL HYSTERECTOMY  07/15/1975   BREAST LUMPECTOMY     CAROTID ENDARTERECTOMY Left 02/28/2009   CHOLECYSTECTOMY  07/14/1989   COLONOSCOPY     COLONOSCOPY N/A 05/08/2017   Procedure: COLONOSCOPY;  Surgeon: Golda Claudis PENNER, MD;  [facility];  Service: Endoscopy;  Laterality: N/A;  12:00   CORONARY ATHERECTOMY N/A 11/19/2022   Procedure: CORONARY ATHERECTOMY;  Surgeon: Darron Deatrice LABOR, MD;  Location: [facility];  Service: Cardiovascular;  Laterality: N/A;   CORONARY STENT INTERVENTION N/A 11/19/2022   Procedure: CORONARY STENT INTERVENTION;  Surgeon: Darron Deatrice LABOR, MD;  Location: [facility];  Service: Cardiovascular;  Laterality: N/A;   CORONARY ULTRASOUND/IVUS N/A 11/19/2022   Procedure: Coronary Ultrasound/IVUS;  Surgeon: Darron Deatrice LABOR, MD;  [facility]: [facility];  Service: Cardiovascular;  Laterality: N/A;   ENDARTERECTOMY  Right 07/03/2023   Procedure: ENDARTERECTOMY CAROTID WITH XENOSURE BIOLOGIC PATCH ANGIOPLASTY;  Surgeon: Serene Gaile ORN, MD;  Location: [facility];  Service: Vascular;  Laterality: Right;   ESOPHAGOGASTRODUODENOSCOPY N/A 04/07/2013   Procedure: ESOPHAGOGASTRODUODENOSCOPY (EGD);  Surgeon: Claudis RAYMOND Rivet, MD;  [facility]: [facility];  Service: Endoscopy;  Laterality: N/A;  250   ESOPHAGOGASTRODUODENOSCOPY N/A 02/02/2014   Procedure: ESOPHAGOGASTRODUODENOSCOPY (EGD);  Surgeon: Claudis RAYMOND Rivet, MD;  [facility]: [facility];  Service: Endoscopy;  Laterality: N/A;  120   EYE SURGERY     Catartact bilateral   INSERTION OF RETROGRADE CAROTID STENT Right 07/03/2023   Procedure: Right common carotid angiogram;  Surgeon: Serene Gaile ORN, MD;  Location: [facility] OR;  Service: Vascular;  Laterality: Right;   JOINT REPLACEMENT  09/12/2011   Left knee   JOINT REPLACEMENT  07/14/2004   Left Knee   kidney stent     Left December 2013   LEFT HEART CATH AND CORONARY ANGIOGRAPHY N/A 11/13/2021   Procedure: LEFT HEART CATH AND CORONARY ANGIOGRAPHY;  Surgeon: Darron Deatrice LABOR, MD;  [facility];  Service: Cardiovascular;  Laterality: N/A;   LEFT HEART CATH AND CORONARY ANGIOGRAPHY N/A 11/18/2022   Procedure: LEFT HEART CATH AND CORONARY ANGIOGRAPHY;  Surgeon: Anner Alm ORN, MD;  Location: [facility] INVASIVE CV LAB;  Service: Cardiovascular;  Laterality: N/A;   LUMBAR DISC SURGERY     L5-S1   MULTIPLE TOOTH EXTRACTIONS  07/14/2013   Neck Fusion  07/14/1994   C4 to C6   PERCUTANEOUS STENT INTERVENTION Left 06/15/2012   Procedure: PERCUTANEOUS STENT INTERVENTION;  Surgeon: Gaile ORN Serene, MD;  Location: [facility] CATH LAB;  Service: Cardiovascular;  Laterality: Left;  renal   PERIPHERAL VASCULAR BALLOON ANGIOPLASTY  05/26/2017   Procedure: PERIPHERAL VASCULAR BALLOON ANGIOPLASTY;  Surgeon: Serene Gaile ORN, MD;  [facility];  Service: Cardiovascular;;  Lt. Renal Angioplasty   POLYPECTOMY   05/08/2017   Procedure: POLYPECTOMY;  Surgeon: Rivet Claudis RAYMOND, MD;  [facility]: [facility];  Service: Endoscopy;;  colon    RENAL ANGIOGRAM N/A 06/15/2012   Procedure: RENAL ANGIOGRAM;  Surgeon: Gaile ORN Serene, MD;  Location: [facility] CATH LAB;  Service: Cardiovascular;  Laterality: N/A;   RENAL ANGIOGRAPHY  05/26/2017   Procedure: RENAL ANGIOGRAPHY;  Surgeon: Serene Gaile ORN, MD;  [facility];  Service: Cardiovascular;;   SPINE SURGERY  07/14/1992   Ruptured disc   TEMPORARY PACEMAKER N/A 11/19/2022   Procedure: TEMPORARY PACEMAKER;  Surgeon: Darron Deatrice LABOR, MD;  [facility];  Service: Cardiovascular;  Laterality: N/A;   TONSILECTOMY, ADENOIDECTOMY, BILATERAL MYRINGOTOMY AND TUBES  07/15/1967   TOTAL KNEE ARTHROPLASTY     Left   TUBAL LIGATION       HPI  from the history and physical done on the day of admission:   Chief Complaint: Difficulty  breathing   HPI: [name] is a 73 y.o. female with medical history significant for coronary artery disease, diastolic CHF, CKD 3B, hypertension, hypothyroidism, rheumatoid arthritis, chronic respiratory failure on 2 L Patient presented to ED with complaints of cough and difficulty breathing that started last night with congestion.  She reports vomiting 3 times today.  No abdominal pain no diarrhea.  She reports central nonradiating, central heavy chest pain that is worse with coughing.  This chest pain is different from her prior heart attack chest pain.  No lower extremity swelling.  No fevers no chills.  She has been compliant with torsemide  10 mg daily.  She reports checking her weights regularly, and denies weight gain.   ED Course: Temperature 99.4.  Heart rate 94-109.  Respiratory rate 24-41.  Blood pressure 117-210 systolic.  O2 sats 90 to 95% on 6 L. Troponin 24. proBNP 3200 Influenza positive. Chest x-ray-chronic appearing increased interstitial lung markings with mild bibasilar atelectasis and or  infiltrate. Tamiflu , Lasix  40 mg, ceftriaxone  and azithromycin  given in ED.   Review of Systems: As per HPI all other systems reviewed and negative.   Hospital Course:     Brief Narrative:  73 y.o. female with medical history significant for coronary artery disease, diastolic CHF, CKD 3B, hypertension, hypothyroidism, rheumatoid arthritis, chronic respiratory failure on 2 L admitted on 07/07/2024 with acute on chronic hypoxic respiratory failure in the setting of influenza A infection with superimposed bronchitis     -Assessment and Plan: 1)Influenza A infection with Bronchitis -RSV and COVID-negative -PCT 0.16 - CTA chest without PE, however showed evidence of bronchitis/reactive airway -- Continue Tamiflu  - Respiratory status improved after treatment with Solu-Medrol  , and mucolytics and bronchodilators -Discharged on p.o. prednisone    2)Acute on chronic hypoxic respiratory failure due to #1 above -Patient at baseline has chronic hypoxic respiratory failure due to idiopathic pulmonary fibrosis - This admission required up to 6 L of oxygen via nasal cannula - Overall much improved after treatment as above oxygen claimant is back to baseline  --at baseline she uses 2 L of oxygen via nasal cannula   3)Atypical chest pain/elevated Troponin--suspect demand ischemia due to #1 and #2 above---Troponin 24 >> 167 >>193>>154 >>141 -Chest pains mostly related to coughing in the setting of influenza -EKG normal sinus rhythm with LVH -No further chest pains, completed 48 hours of IV heparin  - Continue Plavix , aspirin , Crestor  okay to use low-dose metoprolol  if BP tolerates   4) hypothyroidism--- continue levothyroxine    5)CKD stage - 3A - Creatinine is currently actually lower than recent baseline -renally adjust medications, avoid nephrotoxic agents / dehydration  / hypotension -Repeat BMP on 07/13/2024   6) chronic anemia of CKD--- Hgb currently around 10 which is close to baseline -  No acute bleeding concerns - Mild thrombocytopenia noted with platelets of 139---- -repeat CBC on 07/13/2024   Disposition: The patient is from: ALF--high [place]              Anticipated d/c is to: [facility]  Discharge Condition: Stable, oxygen requirement back to baseline of 2 L per nasal cannula  Follow UP   Follow-up Information     Vyas, Dhruv B, MD. Schedule an appointment as soon as possible for a visit in 1 week(s).   Specialty: Internal Medicine Contact information: [address] [place] [address] [phone]                 Diet and Activity recommendation:  As  advised  Discharge Instructions    Discharge Instructions     Call MD for:  difficulty breathing, headache or visual disturbances   Complete by: As directed    Call MD for:  persistant dizziness or light-headedness   Complete by: As directed    Call MD for:  temperature >100.4   Complete by: As directed    Diet - low sodium heart healthy   Complete by: As directed    Discharge instructions   Complete by: As directed    1)You need oxygen at home at 2 L via nasal cannula continuously while awake and while asleep--- smoking or having open fires around oxygen can cause fire, significant injury and death  2)Avoid ibuprofen /Advil /Aleve/Motrin Josefine Powders/Naproxen/BC powders/Meloxicam/Diclofenac /Indomethacin and other Nonsteroidal anti-inflammatory medications as these will make you more likely to bleed and can cause stomach ulcers, can also cause Kidney problems.   3) repeat BMP and CBC blood test on Wednesday, July 13, 2024  4) please note that there is been several changes to your medications   Increase activity slowly   Complete by: As directed          Discharge Medications     Allergies as of 07/11/2024       Reactions   Citalopram Other (See Comments), Palpitations   Heart rate increased   Ciprofloxacin Hcl Diarrhea, Nausea And Vomiting   Dicyclomine  Diarrhea, Other (See  Comments)   Made diarrhea worse   Lamotrigine Diarrhea   Metoclopramide Other (See Comments)   Dizziness   Milnacipran Hcl Other (See Comments)   Unknown   Omeprazole Other (See Comments)   Unknown   Pecan Nut (diagnostic) Other (See Comments)   Unknown reaction, listed on MAR from facility   Pregabalin Other (See Comments)   Unknown   Serotonin Reuptake Inhibitors (ssris) Other (See Comments)   Unknown    Simvastatin  Other (See Comments)   Increased back and muscle pain   Tape Other (See Comments)   Skin gets red and skin pulls off, paper tape only   Tizanidine Other (See Comments)   Unknown    Trazodone  And Nefazodone Other (See Comments)   Hallucinations    Venlafaxine Other (See Comments)   Affected eyes, blurred vision   Celecoxib Itching   Codeine Itching   Nefazodone Other (See Comments)   Sertraline  Hcl Other (See Comments)   Stomach upset. Pt takes this med at home 11/2022        Medication List     PAUSE taking these medications    torsemide  20 MG tablet Wait to take this until: July 13, 2024 Commonly known as: DEMADEX  Take 0.5 tablets (10 mg total) by mouth daily.       STOP taking these medications    dapagliflozin  propanediol 10 MG Tabs tablet Commonly known as: FARXIGA    hydrocortisone  25 MG suppository Commonly known as: ANUSOL -HC   methocarbamol  500 MG tablet Commonly known as: ROBAXIN        TAKE these medications    Abdominal Binder/Elastic Med Misc 1 each by Does not apply route as directed.   acetaminophen  325 MG tablet Commonly known as: TYLENOL  Take 2 tablets (650 mg total) by mouth every 6 (six) hours as needed for mild pain (pain score 1-3) or fever (or Fever >/= 101). What changed:  how much to take reasons to take this   albuterol  108 (90 Base) MCG/ACT inhaler Commonly known as: VENTOLIN  HFA Inhale 1 puff into the lungs 4 (four) times daily as needed  for wheezing or shortness of breath.   ALPRAZolam  0.5 MG  tablet Commonly known as: XANAX  Take 0.5 mg by mouth 2 (two) times daily.   aspirin  EC 81 MG tablet Take 1 tablet (81 mg total) by mouth daily with breakfast. What changed: when to take this   clopidogrel  75 MG tablet Commonly known as: PLAVIX  Take 1 tablet (75 mg total) by mouth daily.   cyanocobalamin  1000 MCG tablet Take 1,000 mcg by mouth daily.   EPINEPHrine 0.3 mg/0.3 mL Soaj injection Commonly known as: EPI-PEN Inject 0.3 mg into the muscle as needed for anaphylaxis.   escitalopram  20 MG tablet Commonly known as: LEXAPRO  Take 20 mg by mouth daily.   ferrous sulfate  325 (65 FE) MG tablet Take 1 tablet (325 mg total) by mouth daily with breakfast.   guaiFENesin  600 MG 12 hr tablet Commonly known as: Mucinex  Take 1 tablet (600 mg total) by mouth 2 (two) times daily.   HYDROcodone -acetaminophen  5-325 MG tablet Commonly known as: NORCO/VICODIN Take 1 tablet by mouth 2 (two) times daily as needed.   ipratropium-albuterol  0.5-2.5 (3) MG/3ML Soln Commonly known as: DUONEB Take 3 mLs by nebulization 3 (three) times daily.   levothyroxine  25 MCG tablet Commonly known as: SYNTHROID  Take 25 mcg by mouth daily before breakfast.   melatonin 3 MG Tabs tablet Take 3 mg by mouth at bedtime.   metoprolol  tartrate 25 MG tablet Commonly known as: LOPRESSOR  Take 0.5 tablets (12.5 mg total) by mouth 2 (two) times daily.   midodrine  10 MG tablet Commonly known as: PROAMATINE  Take 1 tablet (10 mg total) by mouth 3 (three) times daily with meals.   nitroGLYCERIN  0.4 MG SL tablet Commonly known as: NITROSTAT  Place 1 tablet (0.4 mg total) under the tongue every 5 (five) minutes x 3 doses as needed for chest pain (if no relief after 2nd dose, proceed to ED or call 911).   ondansetron  4 MG tablet Commonly known as: ZOFRAN  Take 4 mg by mouth 3 (three) times daily as needed for nausea or vomiting.   oseltamivir  30 MG capsule Commonly known as: TAMIFLU  Take 1 capsule (30 mg  total) by mouth 2 (two) times daily for 3 days.   Oyster Shell Calcium  500 MG Tabs Take 1 tablet by mouth 2 (two) times daily.   pantoprazole  40 MG tablet Commonly known as: PROTONIX  Take 40 mg by mouth daily.   polyethylene glycol powder 17 GM/SCOOP powder Commonly known as: GLYCOLAX /MIRALAX  Take 17 g by mouth daily.   predniSONE  20 MG tablet Commonly known as: DELTASONE  Take 2 tablets (40 mg total) by mouth daily with breakfast for 5 days.   PreserVision AREDS 2 Caps Take 2 capsules by mouth daily.   rosuvastatin  40 MG tablet Commonly known as: CRESTOR  Take 1 tablet (40 mg total) by mouth daily.   traZODone  100 MG tablet Commonly known as: DESYREL  Take 100 mg by mouth at bedtime.   Vitamin D  (Ergocalciferol ) 1.25 MG (50000 UNIT) Caps capsule Commonly known as: DRISDOL  Take 50,000 Units by mouth every Monday.       Major procedures and Radiology Reports - PLEASE review detailed and final reports for all details, in brief -   CT Angio Chest Pulmonary Embolism (PE) W or WO Contrast Result Date: 07/08/2024 EXAM: CTA CHEST 07/08/2024 02:38:59 AM TECHNIQUE: CTA of the chest was performed with the administration of 75 mL of iohexol  (OMNIPAQUE ) 350 MG/ML intravenous contrast. Multiplanar reformatted images are provided for review. MIP images are provided for  review. Automated exposure control, iterative reconstruction, and/or weight based adjustment of the mA/kV was utilized to reduce the radiation dose to as low as reasonably achievable. COMPARISON: None available. CLINICAL HISTORY: Pulmonary embolism (PE) suspected, high prob. FINDINGS: PULMONARY ARTERIES: Pulmonary arteries are adequately opacified for evaluation. Negative for pulmonary embolism. Evaluation of the segmental and subsegmental pulmonary arteries is limited due to respiratory motion artifact. Main pulmonary artery is normal in caliber. MEDIASTINUM: The heart and pericardium demonstrate no acute abnormality. Aortic and  coronary artery atherosclerotic calcification. There is no acute abnormality of the thoracic aorta. LYMPH NODES: No mediastinal, hilar or axillary lymphadenopathy. LUNGS AND PLEURA: Respiratory motion obscures detail on the lungs. Clustered nodularity in the right upper lobe on series 4 image 33 is likely due to small airway infection / inflammation. Debris in the trachea. Bronchial wall thickening and mucus plugging. Trace right pleural effusion. No pneumothorax. UPPER ABDOMEN: Limited images of the upper abdomen are unremarkable. SOFT TISSUES AND BONES: No acute bone or soft tissue abnormality. IMPRESSION: 1. No pulmonary embolism; limited evaluation of the segmental and subsegmental pulmonary arteries secondary to respiratory motion artifact. 2. Constellation of pulmonary findings suggest bronchitis / reactive airways. Limited evaluation of the lungs due to motion artifact. Electronically signed by: Norman Gatlin MD 07/08/2024 02:50 AM EST RP Workstation: [id number]   DG Chest Port 1 View Result Date: 07/07/2024 CLINICAL DATA:  Cough and shortness of breath. EXAM: PORTABLE CHEST 1 VIEW COMPARISON:  April 27, 2014 FINDINGS: The cardiac silhouette is mildly enlarged and unchanged in size. Mild to moderate severity diffuse, chronic appearing increased interstitial lung markings are noted. Mild areas of superimposed atelectasis and/or infiltrate are seen within the bilateral lung bases. No pleural effusion or pneumothorax is identified. Postoperative changes are seen throughout the cervical spine with multilevel degenerative changes present throughout the thoracic spine. IMPRESSION: Chronic appearing increased interstitial lung markings with mild bibasilar atelectasis and/or infiltrate. Electronically Signed   By: Suzen Dials M.D.   On: 07/07/2024 16:21    Micro Results   Recent Results (from the past 240 hours)  Resp panel by RT-PCR (RSV, Flu A&B, Covid) Anterior Nasal Swab     Status: Abnormal    Collection Time: 07/07/24  3:51 PM   Specimen: Anterior Nasal Swab  Result Value Ref Range Status   SARS Coronavirus 2 by RT PCR NEGATIVE NEGATIVE Final    Comment: (NOTE) SARS-CoV-2 target nucleic acids are NOT DETECTED.  The SARS-CoV-2 RNA is generally detectable in upper respiratory specimens during the acute phase of infection. The lowest concentration of SARS-CoV-2 viral copies this assay can detect is 138 copies/mL. A negative result does not preclude SARS-Cov-2 infection and should not be used as the sole basis for treatment or other patient management decisions. A negative result may occur with  improper specimen collection/handling, submission of specimen other than nasopharyngeal swab, presence of viral mutation(s) within the areas targeted by this assay, and inadequate number of viral copies(<138 copies/mL). A negative result must be combined with clinical observations, patient history, and epidemiological information. The expected result is Negative.  Fact Sheet for Patients:  bloggercourse.com  Fact Sheet for Healthcare Providers:  seriousbroker.it  This test is no t yet approved or cleared by the [place]  FDA and  has been authorized for detection and/or diagnosis of SARS-CoV-2 by FDA under an Emergency Use Authorization (EUA). This EUA will remain  in effect (meaning this test can be used) for the duration of the COVID-19 declaration under Section 564(b)(1) of  the Act, 21 U.S.C.section 360bbb-3(b)(1), unless the authorization is terminated  or revoked sooner.       Influenza A by PCR POSITIVE (A) NEGATIVE Final   Influenza B by PCR NEGATIVE NEGATIVE Final    Comment: (NOTE) The Xpert Xpress SARS-CoV-2/FLU/RSV plus assay is intended as an aid in the diagnosis of influenza from Nasopharyngeal swab specimens and should not be used as a sole basis for treatment. Nasal washings and aspirates are unacceptable for  Xpert Xpress SARS-CoV-2/FLU/RSV testing.  Fact Sheet for Patients: bloggercourse.com  Fact Sheet for Healthcare Providers: seriousbroker.it  This test is not yet approved or cleared by the [place]  FDA and has been authorized for detection and/or diagnosis of SARS-CoV-2 by FDA under an Emergency Use Authorization (EUA). This EUA will remain in effect (meaning this test can be used) for the duration of the COVID-19 declaration under Section 564(b)(1) of the Act, 21 U.S.C. section 360bbb-3(b)(1), unless the authorization is terminated or revoked.     Resp Syncytial Virus by PCR NEGATIVE NEGATIVE Final    Comment: (NOTE) Fact Sheet for Patients: bloggercourse.com  Fact Sheet for Healthcare Providers: seriousbroker.it  This test is not yet approved or cleared by the [place]  FDA and has been authorized for detection and/or diagnosis of SARS-CoV-2 by FDA under an Emergency Use Authorization (EUA). This EUA will remain in effect (meaning this test can be used) for the duration of the COVID-19 declaration under Section 564(b)(1) of the Act, 21 U.S.C. section 360bbb-3(b)(1), unless the authorization is terminated or revoked.  Performed at [facility], [address] [place], [place] [address]   Culture, blood (Routine X 2) w Reflex to ID Panel     Status: None (Preliminary result)   Collection Time: 07/07/24 10:08 PM   Specimen: BLOOD  Result Value Ref Range Status   Specimen Description BLOOD BLOOD LEFT ARM  Final   Special Requests   Final    BOTTLES DRAWN AEROBIC AND ANAEROBIC Blood Culture adequate volume   Culture   Final    NO GROWTH 4 DAYS Performed at [facility], [address] [place], [place] [address]    Report Status PENDING  Incomplete  Culture, blood (Routine X 2) w Reflex to ID Panel     Status: None (Preliminary result)   Collection Time: 07/07/24  10:08 PM   Specimen: BLOOD  Result Value Ref Range Status   Specimen Description BLOOD BLOOD LEFT HAND  Final   Special Requests   Final    BOTTLES DRAWN AEROBIC AND ANAEROBIC Blood Culture adequate volume   Culture   Final    NO GROWTH 4 DAYS Performed at [facility], [address], [place], [place] [address]    Report Status PENDING  Incomplete   Today   Subjective    [name] today has no new concerns No fever  Or chills   No Nausea, Vomiting or Diarrhea -- Cough and dyspnea has improved significantly - Oxygen requirement is back to baseline of 2 L via nasal cannula - Eating and drinking okay   Patient has been seen and examined prior to discharge   Objective   Blood pressure (!) 112/48, pulse 62, temperature 97.8 F (36.6 C), temperature source Oral, resp. rate 18, height 4' 11 (1.499 m), weight 67.2 kg, SpO2 98%.   Intake/Output Summary (Last 24 hours) at 07/11/2024 1234 Last data filed at 07/11/2024 1113 Gross per 24 hour  Intake --  Output 1750 ml  Net -1750 ml  Exam Gen:- Awake Alert, no acute distress, no conversational dyspnea HEENT:- [place].AT, No sclera icterus Nose- [place] 2L/min Neck-Supple Neck,No JVD,.  Lungs-improved air movement, no significant wheezing CV- S1, S2 normal, regular Abd-  +ve B.Sounds, Abd Soft, No tenderness,    Extremity/Skin:- No  edema,   good pulses Psych-affect is appropriate, oriented x3 Neuro-no new focal deficits, no tremors    Data Review   CBC w Diff:  Lab Results  Component Value Date   WBC 3.3 (L) 07/10/2024   HGB 10.7 (L) 07/10/2024   HGB 11.9 02/25/2022   HCT 31.9 (L) 07/10/2024   HCT 36.1 02/25/2022   PLT 153 07/10/2024   PLT 175 02/25/2022   LYMPHOPCT 14 07/07/2024   MONOPCT 11 07/07/2024   EOSPCT 1 07/07/2024   BASOPCT 0 07/07/2024    CMP:  Lab Results  Component Value Date   NA 132 (L) 07/10/2024   K 4.7 07/10/2024   CL 93 (L) 07/10/2024   CO2 29 07/10/2024   BUN 34 (H) 07/10/2024    CREATININE 1.21 (H) 07/10/2024   PROT 8.1 07/07/2024   ALBUMIN  4.1 07/10/2024   BILITOT 0.5 07/07/2024   ALKPHOS 84 07/07/2024   AST 25 07/07/2024   ALT 7 07/07/2024  .  Total Discharge time is about 33 minutes  Rendall Carwin M.D on 07/11/2024 at 12:34 PM  Go to [email or web address] -  for contact info  [facility] - Office  [phone]   "

## 2024-07-11 NOTE — Care Management Important Message (Signed)
 Important Message  Patient Details  Name: [name] MRN: [id number] Date of Birth: 1951-01-29   Important Message Given:  Yes - Medicare IM     Darbie Biancardi L Aliene Tamura 07/11/2024, 11:52 AM

## 2024-07-12 LAB — CULTURE, BLOOD (ROUTINE X 2)
Culture: NO GROWTH
Culture: NO GROWTH
Special Requests: ADEQUATE
Special Requests: ADEQUATE

## 2024-07-19 ENCOUNTER — Telehealth (INDEPENDENT_AMBULATORY_CARE_PROVIDER_SITE_OTHER): Payer: Self-pay

## 2024-07-19 NOTE — Telephone Encounter (Signed)
 Will update all parties involved the pt has appt 08/23/24 Lorette Kapur, PA.

## 2024-07-19 NOTE — Telephone Encounter (Signed)
" ° ° °  07/19/2024  [name] Standing January 25, 1951  What type of surgery is being performed? EGD and Colonoscopy  When is surgery scheduled? TBD  What type of clearance is required (medical or pharmacy to hold medication or both? Medication  Are there any medications that need to be held prior to surgery and how long? Plavix , hold 5 days prior to procedure.  Name of physician performing surgery?  Dr. Cindie Rouse [facility] Phone: [phone] Fax: [phone]  Anethesia type (none, local, MAC, general)? MAC   "

## 2024-07-19 NOTE — Telephone Encounter (Signed)
" ° °  Name: [name]  DOB: 09-12-1950  MRN: [id number]  Primary Cardiologist: Jayson Sierras, MD  Chart reviewed as part of pre-operative protocol coverage. The patient has an upcoming visit scheduled with Teresa Kapur, PA on 08/23/24 at which time clearance can be addressed in case there are any issues that would impact surgical recommendations.  EGD and colonoscopy is not scheduled as below. I added preop FYI to appointment note so that provider is aware to address at time of outpatient visit.  Per office protocol the cardiology provider should forward their finalized clearance decision and recommendations regarding antiplatelet therapy to the requesting party below.    Patient again with recent hospital admission, noted chest pain and a mildly elevated troponin, was on heparin  drip while inpatient.  Recommend evaluation by APP prior to holding of Plavix  given recurrent chest pain.  I will route this message as FYI to requesting party and remove this message from the preop box as separate preop APP input not needed at this time.   Please call with any questions.  Rykar Lebleu D Aleigh Grunden, NP  07/19/2024, 3:50 PM   "

## 2024-08-03 ENCOUNTER — Other Ambulatory Visit (HOSPITAL_COMMUNITY): Payer: Self-pay | Admitting: Nephrology

## 2024-08-03 DIAGNOSIS — D638 Anemia in other chronic diseases classified elsewhere: Secondary | ICD-10-CM

## 2024-08-03 DIAGNOSIS — N184 Chronic kidney disease, stage 4 (severe): Secondary | ICD-10-CM

## 2024-08-11 ENCOUNTER — Ambulatory Visit (HOSPITAL_COMMUNITY)
Admission: RE | Admit: 2024-08-11 | Discharge: 2024-08-11 | Disposition: A | Discharge status: Home / Self Care | Source: Ambulatory Visit | Attending: Nephrology | Admitting: Nephrology

## 2024-08-11 DIAGNOSIS — N184 Chronic kidney disease, stage 4 (severe): Secondary | ICD-10-CM | POA: Insufficient documentation

## 2024-08-11 DIAGNOSIS — D638 Anemia in other chronic diseases classified elsewhere: Secondary | ICD-10-CM | POA: Insufficient documentation

## 2024-08-23 ENCOUNTER — Ambulatory Visit: Admitting: Physician Assistant
